# Patient Record
Sex: Male | Born: 1935 | Race: White | Hispanic: No | Smoking: Former smoker
Health system: Southern US, Community
[De-identification: ages and names within clinical notes are randomized; demographics above are authoritative.]

## PROBLEM LIST (undated history)

## (undated) DIAGNOSIS — E785 Hyperlipidemia, unspecified: Secondary | ICD-10-CM

## (undated) DIAGNOSIS — I10 Essential (primary) hypertension: Secondary | ICD-10-CM

## (undated) DIAGNOSIS — Z8719 Personal history of other diseases of the digestive system: Secondary | ICD-10-CM

## (undated) DIAGNOSIS — I471 Supraventricular tachycardia, unspecified: Secondary | ICD-10-CM

## (undated) DIAGNOSIS — K5790 Diverticulosis of intestine, part unspecified, without perforation or abscess without bleeding: Secondary | ICD-10-CM

## (undated) DIAGNOSIS — I739 Peripheral vascular disease, unspecified: Secondary | ICD-10-CM

## (undated) DIAGNOSIS — K219 Gastro-esophageal reflux disease without esophagitis: Secondary | ICD-10-CM

## (undated) DIAGNOSIS — I5042 Chronic combined systolic (congestive) and diastolic (congestive) heart failure: Secondary | ICD-10-CM

## (undated) DIAGNOSIS — I2510 Atherosclerotic heart disease of native coronary artery without angina pectoris: Secondary | ICD-10-CM

## (undated) DIAGNOSIS — I350 Nonrheumatic aortic (valve) stenosis: Secondary | ICD-10-CM

## (undated) DIAGNOSIS — E039 Hypothyroidism, unspecified: Secondary | ICD-10-CM

## (undated) DIAGNOSIS — I255 Ischemic cardiomyopathy: Secondary | ICD-10-CM

## (undated) DIAGNOSIS — I4719 Other supraventricular tachycardia: Secondary | ICD-10-CM

## (undated) DIAGNOSIS — Z7409 Other reduced mobility: Secondary | ICD-10-CM

## (undated) DIAGNOSIS — H3530 Unspecified macular degeneration: Secondary | ICD-10-CM

## (undated) DIAGNOSIS — R296 Repeated falls: Secondary | ICD-10-CM

## (undated) DIAGNOSIS — I4892 Unspecified atrial flutter: Secondary | ICD-10-CM

## (undated) DIAGNOSIS — I480 Paroxysmal atrial fibrillation: Secondary | ICD-10-CM

## (undated) DIAGNOSIS — Z952 Presence of prosthetic heart valve: Secondary | ICD-10-CM

## (undated) DIAGNOSIS — Z9989 Dependence on other enabling machines and devices: Secondary | ICD-10-CM

## (undated) DIAGNOSIS — G4733 Obstructive sleep apnea (adult) (pediatric): Secondary | ICD-10-CM

## (undated) HISTORY — DX: Supraventricular tachycardia, unspecified: I47.10

## (undated) HISTORY — DX: Diverticulosis of intestine, part unspecified, without perforation or abscess without bleeding: K57.90

## (undated) HISTORY — PX: CATARACT EXTRACTION W/ INTRAOCULAR LENS  IMPLANT, BILATERAL: SHX1307

## (undated) HISTORY — DX: Hyperlipidemia, unspecified: E78.5

## (undated) HISTORY — DX: Obstructive sleep apnea (adult) (pediatric): G47.33

## (undated) HISTORY — DX: Ischemic cardiomyopathy: I25.5

## (undated) HISTORY — DX: Personal history of other diseases of the digestive system: Z87.19

## (undated) HISTORY — DX: Unspecified macular degeneration: H35.30

## (undated) HISTORY — DX: Peripheral vascular disease, unspecified: I73.9

## (undated) HISTORY — DX: Supraventricular tachycardia: I47.1

## (undated) HISTORY — DX: Essential (primary) hypertension: I10

## (undated) HISTORY — PX: CORNEAL TRANSPLANT: SHX108

## (undated) HISTORY — DX: Dependence on other enabling machines and devices: Z99.89

## (undated) HISTORY — DX: Unspecified atrial flutter: I48.92

## (undated) HISTORY — DX: Atherosclerotic heart disease of native coronary artery without angina pectoris: I25.10

## (undated) HISTORY — PX: GLAUCOMA SURGERY: SHX656

## (undated) HISTORY — DX: Nonrheumatic aortic (valve) stenosis: I35.0

---
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
## 1998-09-02 ENCOUNTER — Encounter: Payer: Self-pay | Admitting: Specialist

## 1998-09-07 ENCOUNTER — Ambulatory Visit (HOSPITAL_COMMUNITY): Admission: RE | Admit: 1998-09-07 | Discharge: 1998-09-07 | Payer: Self-pay | Admitting: Specialist

## 1999-07-12 ENCOUNTER — Ambulatory Visit: Admission: RE | Admit: 1999-07-12 | Discharge: 1999-07-12 | Payer: Self-pay | Admitting: Internal Medicine

## 2000-03-05 ENCOUNTER — Ambulatory Visit (HOSPITAL_COMMUNITY): Admission: RE | Admit: 2000-03-05 | Discharge: 2000-03-05 | Payer: Self-pay | Admitting: Specialist

## 2000-03-05 ENCOUNTER — Encounter: Payer: Self-pay | Admitting: Specialist

## 2000-10-15 ENCOUNTER — Other Ambulatory Visit: Admission: RE | Admit: 2000-10-15 | Discharge: 2000-10-15 | Payer: Self-pay | Admitting: *Deleted

## 2000-10-15 ENCOUNTER — Encounter (INDEPENDENT_AMBULATORY_CARE_PROVIDER_SITE_OTHER): Payer: Self-pay | Admitting: Specialist

## 2002-06-18 ENCOUNTER — Emergency Department (HOSPITAL_COMMUNITY): Admission: EM | Admit: 2002-06-18 | Discharge: 2002-06-18 | Payer: Self-pay | Admitting: *Deleted

## 2002-12-15 ENCOUNTER — Encounter: Payer: Self-pay | Admitting: Family Medicine

## 2002-12-15 ENCOUNTER — Ambulatory Visit (HOSPITAL_COMMUNITY): Admission: RE | Admit: 2002-12-15 | Discharge: 2002-12-15 | Payer: Self-pay | Admitting: Family Medicine

## 2002-12-20 ENCOUNTER — Ambulatory Visit (HOSPITAL_COMMUNITY): Admission: RE | Admit: 2002-12-20 | Discharge: 2002-12-20 | Payer: Self-pay | Admitting: Family Medicine

## 2002-12-20 ENCOUNTER — Encounter: Payer: Self-pay | Admitting: Family Medicine

## 2003-01-08 ENCOUNTER — Encounter: Payer: Self-pay | Admitting: Family Medicine

## 2003-01-08 ENCOUNTER — Encounter: Admission: RE | Admit: 2003-01-08 | Discharge: 2003-01-08 | Payer: Self-pay | Admitting: Family Medicine

## 2004-12-13 ENCOUNTER — Ambulatory Visit: Payer: Self-pay | Admitting: Gastroenterology

## 2004-12-25 ENCOUNTER — Ambulatory Visit: Payer: Self-pay | Admitting: Gastroenterology

## 2006-05-27 ENCOUNTER — Encounter: Admission: RE | Admit: 2006-05-27 | Discharge: 2006-05-27 | Payer: Self-pay | Admitting: Family Medicine

## 2008-06-29 ENCOUNTER — Encounter: Admission: RE | Admit: 2008-06-29 | Discharge: 2008-06-29 | Payer: Self-pay | Admitting: Family Medicine

## 2008-12-25 ENCOUNTER — Ambulatory Visit: Payer: Self-pay | Admitting: Cardiology

## 2008-12-25 ENCOUNTER — Ambulatory Visit: Payer: Self-pay | Admitting: Internal Medicine

## 2008-12-25 ENCOUNTER — Observation Stay (HOSPITAL_COMMUNITY): Admission: EM | Admit: 2008-12-25 | Discharge: 2008-12-27 | Payer: Self-pay | Admitting: Emergency Medicine

## 2008-12-26 ENCOUNTER — Encounter: Payer: Self-pay | Admitting: *Deleted

## 2008-12-26 LAB — CONVERTED CEMR LAB
Cholesterol: 112 mg/dL
Triglycerides: 174 mg/dL

## 2008-12-27 ENCOUNTER — Encounter: Payer: Self-pay | Admitting: Internal Medicine

## 2008-12-27 ENCOUNTER — Ambulatory Visit: Payer: Self-pay | Admitting: *Deleted

## 2008-12-27 ENCOUNTER — Encounter (INDEPENDENT_AMBULATORY_CARE_PROVIDER_SITE_OTHER): Payer: Self-pay | Admitting: Emergency Medicine

## 2009-01-14 ENCOUNTER — Ambulatory Visit: Payer: Self-pay | Admitting: Internal Medicine

## 2009-01-14 ENCOUNTER — Encounter: Payer: Self-pay | Admitting: *Deleted

## 2009-01-14 DIAGNOSIS — E785 Hyperlipidemia, unspecified: Secondary | ICD-10-CM

## 2009-01-14 DIAGNOSIS — J301 Allergic rhinitis due to pollen: Secondary | ICD-10-CM

## 2009-01-27 ENCOUNTER — Telehealth: Payer: Self-pay | Admitting: Internal Medicine

## 2009-01-31 ENCOUNTER — Telehealth: Payer: Self-pay | Admitting: Internal Medicine

## 2009-05-12 ENCOUNTER — Telehealth: Payer: Self-pay | Admitting: Internal Medicine

## 2009-05-27 ENCOUNTER — Telehealth: Payer: Self-pay | Admitting: Internal Medicine

## 2009-05-31 ENCOUNTER — Telehealth: Payer: Self-pay | Admitting: Internal Medicine

## 2009-07-22 ENCOUNTER — Inpatient Hospital Stay (HOSPITAL_COMMUNITY): Admission: EM | Admit: 2009-07-22 | Discharge: 2009-07-25 | Payer: Self-pay | Admitting: Emergency Medicine

## 2009-07-22 ENCOUNTER — Encounter (INDEPENDENT_AMBULATORY_CARE_PROVIDER_SITE_OTHER): Payer: Self-pay | Admitting: Internal Medicine

## 2009-07-22 ENCOUNTER — Ambulatory Visit: Payer: Self-pay | Admitting: Infectious Diseases

## 2009-07-25 ENCOUNTER — Encounter (INDEPENDENT_AMBULATORY_CARE_PROVIDER_SITE_OTHER): Payer: Self-pay | Admitting: Internal Medicine

## 2009-07-25 ENCOUNTER — Encounter: Payer: Self-pay | Admitting: Infectious Diseases

## 2009-07-28 ENCOUNTER — Telehealth: Payer: Self-pay | Admitting: Internal Medicine

## 2009-08-31 ENCOUNTER — Encounter: Payer: Self-pay | Admitting: Internal Medicine

## 2009-08-31 ENCOUNTER — Ambulatory Visit (HOSPITAL_COMMUNITY): Admission: RE | Admit: 2009-08-31 | Discharge: 2009-08-31 | Payer: Self-pay | Admitting: Internal Medicine

## 2009-08-31 ENCOUNTER — Ambulatory Visit: Payer: Self-pay | Admitting: Internal Medicine

## 2009-08-31 DIAGNOSIS — R42 Dizziness and giddiness: Secondary | ICD-10-CM

## 2009-09-12 ENCOUNTER — Encounter: Payer: Self-pay | Admitting: Internal Medicine

## 2009-09-12 ENCOUNTER — Encounter: Payer: Self-pay | Admitting: Emergency Medicine

## 2009-09-12 ENCOUNTER — Ambulatory Visit: Payer: Self-pay | Admitting: Internal Medicine

## 2009-09-12 ENCOUNTER — Inpatient Hospital Stay (HOSPITAL_COMMUNITY): Admission: EM | Admit: 2009-09-12 | Discharge: 2009-09-15 | Payer: Self-pay | Admitting: Internal Medicine

## 2009-09-15 ENCOUNTER — Encounter: Payer: Self-pay | Admitting: Internal Medicine

## 2009-10-10 ENCOUNTER — Emergency Department (HOSPITAL_COMMUNITY): Admission: EM | Admit: 2009-10-10 | Discharge: 2009-10-10 | Payer: Self-pay | Admitting: Emergency Medicine

## 2009-10-11 ENCOUNTER — Telehealth: Payer: Self-pay | Admitting: Internal Medicine

## 2009-11-01 ENCOUNTER — Encounter: Payer: Self-pay | Admitting: Internal Medicine

## 2009-11-01 ENCOUNTER — Ambulatory Visit: Payer: Self-pay | Admitting: Internal Medicine

## 2009-11-21 ENCOUNTER — Telehealth: Payer: Self-pay | Admitting: Internal Medicine

## 2010-01-30 ENCOUNTER — Telehealth (INDEPENDENT_AMBULATORY_CARE_PROVIDER_SITE_OTHER): Payer: Self-pay | Admitting: *Deleted

## 2010-02-15 ENCOUNTER — Telehealth: Payer: Self-pay | Admitting: Internal Medicine

## 2010-03-24 ENCOUNTER — Ambulatory Visit: Payer: Self-pay | Admitting: Internal Medicine

## 2010-03-24 DIAGNOSIS — H3530 Unspecified macular degeneration: Secondary | ICD-10-CM | POA: Insufficient documentation

## 2010-03-24 LAB — CONVERTED CEMR LAB
Alkaline Phosphatase: 72 units/L (ref 39–117)
Creatinine, Ser: 0.73 mg/dL (ref 0.40–1.50)
Potassium: 4 meq/L (ref 3.5–5.3)
Sodium: 141 meq/L (ref 135–145)
Total Protein: 7 g/dL (ref 6.0–8.3)

## 2010-04-12 ENCOUNTER — Telehealth: Payer: Self-pay | Admitting: Internal Medicine

## 2010-05-20 ENCOUNTER — Encounter: Payer: Self-pay | Admitting: Internal Medicine

## 2010-06-20 ENCOUNTER — Telehealth: Payer: Self-pay | Admitting: Internal Medicine

## 2010-07-05 ENCOUNTER — Encounter: Payer: Self-pay | Admitting: Internal Medicine

## 2010-07-14 ENCOUNTER — Ambulatory Visit: Payer: Self-pay | Admitting: Internal Medicine

## 2010-07-14 DIAGNOSIS — H409 Unspecified glaucoma: Secondary | ICD-10-CM | POA: Insufficient documentation

## 2010-07-18 ENCOUNTER — Other Ambulatory Visit: Payer: Self-pay | Admitting: Emergency Medicine

## 2010-07-18 ENCOUNTER — Inpatient Hospital Stay (HOSPITAL_COMMUNITY): Admission: EM | Admit: 2010-07-18 | Discharge: 2010-07-19 | Payer: Self-pay | Admitting: Internal Medicine

## 2010-07-18 ENCOUNTER — Ambulatory Visit: Payer: Self-pay | Admitting: Internal Medicine

## 2010-07-18 ENCOUNTER — Encounter: Payer: Self-pay | Admitting: Internal Medicine

## 2010-07-19 ENCOUNTER — Encounter: Payer: Self-pay | Admitting: Internal Medicine

## 2010-07-19 DIAGNOSIS — K5730 Diverticulosis of large intestine without perforation or abscess without bleeding: Secondary | ICD-10-CM | POA: Insufficient documentation

## 2010-07-21 ENCOUNTER — Encounter: Payer: Self-pay | Admitting: *Deleted

## 2010-08-07 ENCOUNTER — Ambulatory Visit: Payer: Self-pay | Admitting: Internal Medicine

## 2010-08-07 LAB — CONVERTED CEMR LAB
Eosinophils Absolute: 0.4 10*3/uL (ref 0.0–0.7)
Lymphocytes Relative: 23 % (ref 12–46)
MCV: 94.4 fL (ref >=78.0)
Monocytes Absolute: 0.8 10*3/uL (ref 0.1–1.0)
Neutro Abs: 4.8 10*3/uL (ref 1.7–7.7)
Platelets: 240 10*3/uL (ref 150–400)
RBC: 4.81 M/uL (ref 4.22–5.81)
RDW: 15 % (ref 11.5–15.5)

## 2010-08-21 ENCOUNTER — Telehealth: Payer: Self-pay | Admitting: Internal Medicine

## 2010-08-22 ENCOUNTER — Encounter: Payer: Self-pay | Admitting: Internal Medicine

## 2010-09-13 ENCOUNTER — Ambulatory Visit: Payer: Self-pay | Admitting: Internal Medicine

## 2010-09-13 LAB — CONVERTED CEMR LAB
ALT: 13 units/L (ref 0–53)
Albumin: 4.4 g/dL (ref 3.5–5.2)
Bilirubin, Direct: 0.1 mg/dL (ref 0.0–0.3)
Cholesterol: 196 mg/dL (ref 0–200)
Glucose, Bld: 96 mg/dL (ref 70–99)
HDL: 45 mg/dL (ref >39)
LDL Cholesterol: 115 mg/dL — ABNORMAL HIGH (ref 0–99)
Potassium: 4.1 meq/L (ref 3.5–5.3)
Sodium: 141 meq/L (ref 135–145)
Total Bilirubin: 0.8 mg/dL (ref 0.3–1.2)
Total CHOL/HDL Ratio: 4.4
Total Protein: 6.9 g/dL (ref 6.0–8.3)

## 2010-10-13 ENCOUNTER — Encounter: Payer: Self-pay | Admitting: Internal Medicine

## 2010-10-13 ENCOUNTER — Ambulatory Visit: Admission: RE | Admit: 2010-10-13 | Discharge: 2010-10-13 | Payer: Self-pay | Source: Home / Self Care

## 2010-10-16 ENCOUNTER — Telehealth: Payer: Self-pay | Admitting: Internal Medicine

## 2010-11-09 NOTE — Assessment & Plan Note (Signed)
Summary: FU VISIT/DS   Vital Signs:  Patient profile:   77 year old male Height:      67 inches (170.18 cm) Weight:      217.7 pounds (95.91 kg) BMI:     34.22 Temp:     97.1 degrees F (36.17 degrees C) oral Pulse rate:   71 / minute BP sitting:   177 / 86  (left arm) Cuff size:   regular  Vitals Entered By: Theotis Barrio NT II (March 24, 2010 9:58 AM) CC: ROUTINE OFFICE VISIT / RASH BILATERAL THIGH   Is Patient Diabetic? No Pain Assessment Patient in pain? no      Nutritional Status BMI of > 30 = obese  Have you ever been in a relationship where you felt threatened, hurt or afraid?No   Does patient need assistance? Functional Status Self care Ambulation Normal Comments ROUTINE OFFICE VISIT  /  Rolm Baptise RASH   Primary Care Provider:  Clerance Lav MD  CC:  ROUTINE OFFICE VISIT / RASH BILATERAL THIGH  .  History of Present Illness: Patient here for general followup. Has been keeping record of blood pressures at home. He is very pleased with the care he has been receiving from Drs. Sherryll Burger and [place] and especially mentioned the changes in his medications to fewer and lower cost ones that seem to be controlling his medical issues. He denies chest pain, shortness of breath, changes in bowel habits, blood in stool, melena or hematochezia, hematuria or dysuria.  He does report a rash in the groin area on both legs which he thinks may be related to a medication. Reports that it is severely pruritic and that he does have some areas of bleeding at times after her scratches too much. Has been controlling pruritis by applying peroxide to the area.    Preventive Screening-Counseling & Management  Alcohol-Tobacco     Smoking Status: quit     Smoking Cessation Counseling: yes     Year Quit: OVER SO YEARS AGO  Caffeine-Diet-Exercise     Does Patient Exercise: yes     Type of exercise: DANCE     Exercise (avg: min/session): [facility]  Problems Prior to Update: 1)   Atrial Tachycardia-nonsustained  (ICD-785.0) 2)  Hx of Dizziness  (ICD-780.4) 3)  Allergic Rhinitis Due To Pollen  (ICD-477.0) 4)  Coronary Artery Disease Sp Mi  (ICD-414.00) 5)  Hyperlipidemia  (ICD-272.4) 6)  Essential Hypertension, Benign  (ICD-401.1)  Current Medications (verified): 1)  Amlodipine Besylate 10 Mg Tabs (Amlodipine Besylate) .... Take 1 Tablet By Mouth Once A Day 2)  Losartan Potassium 50 Mg Tabs (Losartan Potassium) .... Take 1 Tablet By Mouth Once A Day 3)  Simvastatin 20 Mg Tabs (Simvastatin) .... Take 1 Tablet By Mouth Once A Day 4)  Cpap .... Everynight For Sleep Apnea 5)  Coreg 3.125 Mg Tabs (Carvedilol) .... Take One Tablet Two Times A Day 6)  Travatan Z 0.004 % Soln (Travoprost) .... One Drop in Right Eye Once At Bedtime 7)  Lotemax 0.5 % Susp (Loteprednol Etabonate) .... One Drop in Right Eye Once Daily  Allergies (verified): 1)  ! Pcn  Past History:  Past Medical History: Last updated: 12/27/2008 CAD s/p stents (9 per pt) 1991 Obstructive Sleep Apnea Hyperlipidemia Hypertriglyceridemia Hypertension  Social History: Last updated: 03/24/2010 Lives by himself in [place] Is a veteran His grandson from [place], [place] and his grandson's wife live with him   Social History: Reviewed history from 12/27/2008 and no  changes required. Lives by himself in [place] Is a veteran His grandson from [place], [place] and his grandson's wife live with him Does Patient Exercise:  yes  Review of Systems      See HPI  Physical Exam  General:  alert, well-developed, well-nourished, and well-hydrated.   Head:  normocephalic and atraumatic.   Eyes:  vision grossly intact.  Reports decreased visual acuity in R eye from macular degeneration and is taking eye drops (noted in meds now) and is followed by an ophthalmologist Ears:  no external deformities.   Nose:  no external deformity, no external erythema, and no nasal discharge.   Mouth:  pharynx pink and moist.   Neck:  supple,  full ROM, and no masses.   Lungs:  normal respiratory effort, normal breath sounds, no crackles, and no wheezes.   Heart:  normal rate and regular rhythm.  Systolic murmur throughout much if not all of systole, frequent ectopic beats. Abdomen:  soft, non-tender, normal bowel sounds, no distention, no guarding, and no rigidity.   Neurologic:  alert & oriented X3 and cranial nerves II-XII intact.   Skin:  Rash, maculopapular in groin area with excoriations. Appearance c/w tinea cruris Psych:  Oriented X3, memory intact for recent and remote, normally interactive, good eye contact, not anxious appearing, and not depressed appearing.  He also relates several stories of different foreign nationals that he has "sponsored" to come to the [place] back in the 61s including a family from [place] and a young man from [place] who are now very well accomplished.   Impression & Recommendations:  Problem # 1:  ESSENTIAL HYPERTENSION, BENIGN (ICD-401.1) Patient brings blood pressure diary with past 8 days ranging from 113-144/63-84. Repeated BP after several minutes of sitting with 169/85 as the result. The patient had not been taking the carvedilol, but is very happy with changes made in the past by Dr. Sherryll Burger with a reduction in his medications. I believe that adding the low dose carvedilol to the regimen is appropriate today given his heart history and two checks that were elevated. Will reassess at follow up appointment.  The following medications were removed from the medication list:    Lasix 20 Mg Tabs (Furosemide) .Marland Kitchen... 1 tab daily His updated medication list for this problem includes:    Amlodipine Besylate 10 Mg Tabs (Amlodipine besylate) .Marland Kitchen... Take 1 tablet by mouth once a day    Losartan Potassium 50 Mg Tabs (Losartan potassium) .Marland Kitchen... Take 1 tablet by mouth once a day    Coreg 3.125 Mg Tabs (Carvedilol) .Marland Kitchen... Take one tablet two times a day  Problem # 2:  Sx of SKIN RASH (ICD-782.1) Appears to  be tinea cruris, patient was using peroxide at home to help with itching, but rash still present. Will treat with topical antifungal, patient told to make another appointment if thre is no improvement with topical antifungal.  His updated medication list for this problem includes:    Clotrimazole 1 % Crea (Clotrimazole) .Marland Kitchen... Apply to affected area two times a day  Problem # 3:  CORONARY ARTERY DISEASE SP MI (ICD-414.00) Patient on aspirin and will add back beta blocker as patient was not taking.   The following medications were removed from the medication list:    Lasix 20 Mg Tabs (Furosemide) .Marland Kitchen... 1 tab daily His updated medication list for this problem includes:    Amlodipine Besylate 10 Mg Tabs (Amlodipine besylate) .Marland Kitchen... Take 1 tablet by mouth once a day  Losartan Potassium 50 Mg Tabs (Losartan potassium) .Marland Kitchen... Take 1 tablet by mouth once a day    Coreg 3.125 Mg Tabs (Carvedilol) .Marland Kitchen... Take one tablet two times a day  Problem # 4:  HYPERLIPIDEMIA (ICD-272.4) Last LDL well within goal. No changes.  His updated medication list for this problem includes:    Simvastatin 20 Mg Tabs (Simvastatin) .Marland Kitchen... Take 1 tablet by mouth once a day  Problem # 5:  MACULAR DEGENERATION, RIGHT EYE (ICD-362.50) Advised patient to followup as scheduled with his ophthalmologist and to ask if certain vitamin formulations would be beneficial. Added medications to patient's list to be complete.  Complete Medication List: 1)  Amlodipine Besylate 10 Mg Tabs (Amlodipine besylate) .... Take 1 tablet by mouth once a day 2)  Losartan Potassium 50 Mg Tabs (Losartan potassium) .... Take 1 tablet by mouth once a day 3)  Simvastatin 20 Mg Tabs (Simvastatin) .... Take 1 tablet by mouth once a day 4)  Cpap  .... Everynight for sleep apnea 5)  Coreg 3.125 Mg Tabs (Carvedilol) .... Take one tablet two times a day 6)  Travatan Z 0.004 % Soln (Travoprost) .... One drop in right eye once at bedtime 7)  Lotemax 0.5 % Susp  (Loteprednol etabonate) .... One drop in right eye once daily 8)  Clotrimazole 1 % Crea (Clotrimazole) .... Apply to affected area two times a day  Other Orders: T-Comprehensive Metabolic Panel ([id number])  Patient Instructions: 1)  Please schedule a follow-up appointment in 4 months. 2)  It is important that you exercise regularly at least 20 minutes 5 times a week. If you develop chest pain, have severe difficulty breathing, or feel very tired , stop exercising immediately and seek medical attention. 3)  You need to continue to lose weight. Consider a lower calorie diet and regular exercise.  4)  Check your Blood Pressure regularly. If it is above: 150/100 you should make an appointment. Prescriptions: COREG 3.125 MG TABS (CARVEDILOL) take one tablet two times a day  #30 x 3   Entered and Authorized by:   Nilda Riggs MD   Signed by:   Nilda Riggs MD on 03/24/2010   Method used:   Electronically to        [id number] (retail)       [address]       [place], [place]  [address]       Ph: [phone] or [phone]       Fax: [phone]   RxID:   [id number] CLOTRIMAZOLE 1 % CREA (CLOTRIMAZOLE) apply to affected area two times a day  #45 gram x 1   Entered and Authorized by:   Nilda Riggs MD   Signed by:   Nilda Riggs MD on 03/24/2010   Method used:   Electronically to        [id number] (retail)       [address]       [place], [place]  [address]       Ph: [phone] or [phone]       Fax: [phone]   RxID:   [id number]  Process Orders Check Orders Results:     [facility]: Check successful Tests Sent for requisitioning (March 24, 2010 1:41 PM):     03/24/2010: [facility] -- T-Comprehensive Metabolic Panel [id number] (signed)    Prevention & Chronic Care Immunizations   Influenza vaccine: Historical  (06/08/2009)    Tetanus booster: Not documented  Pneumococcal vaccine: Not documented    H.  zoster vaccine: Not documented  Colorectal Screening   Hemoccult: Not documented    Colonoscopy: Not documented   Colonoscopy action/deferral: Refused  (08/31/2009)  Other Screening   PSA: Not documented   PSA action/deferral: Not indicated  (08/31/2009)   Smoking status: quit  (03/24/2010)  Lipids   Total Cholesterol: 112  (12/26/2008)   LDL: 52  (12/26/2008)   LDL Direct: Not documented   HDL: 25  (12/26/2008)   Triglycerides: 174  (12/26/2008)    SGOT (AST): Not documented   BMP action: Ordered   SGPT (ALT): Not documented CMP ordered    Alkaline phosphatase: Not documented   Total bilirubin: Not documented    Lipid flowsheet reviewed?: Yes   Progress toward LDL goal: Unchanged  Hypertension   Last Blood Pressure: 177 / 86  (03/24/2010)   Serum creatinine: Not documented   BMP action: Ordered   Serum potassium Not documented CMP ordered     Hypertension flowsheet reviewed?: Yes   Progress toward BP goal: Unchanged  Self-Management Support :   Personal Goals (by the next clinic visit) :      Personal blood pressure goal: 130/80  (08/31/2009)     Personal LDL goal: 100  (08/31/2009)    Patient will work on the following items until the next clinic visit to reach self-care goals:     Medications and monitoring: take my medicines every day, bring all of my medications to every visit  (03/24/2010)     Eating: drink diet soda or water instead of juice or soda, eat more vegetables, use fresh or frozen vegetables, eat foods that are low in salt, eat baked foods instead of fried foods, eat fruit for snacks and desserts, limit or avoid alcohol  (03/24/2010)     Activity: take a 30 minute walk every day  (03/24/2010)    Hypertension self-management support: Resources for patients handout, Written self-care plan  (03/24/2010)   Hypertension self-care plan printed.    Lipid self-management support: Resources for patients handout, Written self-care plan  (03/24/2010)    Lipid self-care plan printed.      Resource handout printed.

## 2010-11-09 NOTE — Assessment & Plan Note (Signed)
Summary: FU/SB.   Vital Signs:  Patient profile:   77 year old male Height:      67 inches (170.18 cm) Weight:      216.9 pounds (98.59 kg) BMI:     34.09 Temp:     97.2 degrees F oral Pulse rate:   69 / minute BP sitting:   155 / 89  (right arm) Cuff size:   large  Vitals Entered By: Chinita Pester RN (September 13, 2010 8:47 AM) CC: F/u visit. Is Patient Diabetic? No Pain Assessment Patient in pain? no      Nutritional Status BMI of > 30 = obese  Have you ever been in a relationship where you felt threatened, hurt or afraid?No   Does patient need assistance? Functional Status Self care Ambulation Impaired:Risk for fall Comments uses a cane   Primary Care Provider:  Clerance Lav MD  CC:  F/u visit.Marland Kitchen  History of Present Illness: The patient is an 77 year oldmale with past medical history significant for hypertension, hyperlipidemia, coronary artery disease, sleep apnea, and a remote history of lower GI bleed secondary to diverticolosis, who presents to the clinic today for general follow up. Of note, the patient was admitted to teaching service 07/21/10 for GI bleed at home. He was admitted for overningt obstervation. He had no further episodes of GI bleed in the hospital and Hb remianed stable. He had follow up with Dr. Madilyn Fireman 11/15 for possible colonoscopy, but at that appt, it was deemed that colonscopy was optional, and both pt and Dr. Madilyn Fireman were leaning against it. Today he states he takes two stool softners daily, and that keeps him from being constipated.  He states he has not had any more episodes of GI bleed since hospital discharge. no pain in belly, nausea, vomitng or other complaints. no dizziness or light headedness.   Depression History:      The patient denies a depressed mood most of the day and a diminished interest in his usual daily activities.         Preventive Screening-Counseling & Management  Alcohol-Tobacco     Alcohol drinks/day: 0     Smoking  Status: quit     Smoking Cessation Counseling: yes     Year Quit: OVER SO YEARS AGO  Caffeine-Diet-Exercise     Does Patient Exercise: yes     Type of exercise: DANCE     Exercise (avg: min/session): [facility]  Current Medications (verified): 1)  Amlodipine Besylate 10 Mg Tabs (Amlodipine Besylate) .... Take 1 Tablet By Mouth Once A Day 2)  Losartan Potassium 50 Mg Tabs (Losartan Potassium) .... Take 1 Tablet By Mouth Once A Day 3)  Simvastatin 20 Mg Tabs (Simvastatin) .... Take 1 Tablet By Mouth Once A Day 4)  Cpap .... Everynight For Sleep Apnea 5)  Coreg 3.125 Mg Tabs (Carvedilol) .... Take One Tablet Two Times A Day 6)  Travatan Z 0.004 % Soln (Travoprost) .... One Drop in Right Eye Once At Bedtime 7)  Lotemax 0.5 % Susp (Loteprednol Etabonate) .... One Drop in Right Eye Once Daily 8)  Clotrimazole 1 % Crea (Clotrimazole) .... Apply To Affected Area Two Times A Day 9)  Bayer Aspirin Ec Low Dose 81 Mg Tbec (Aspirin) .... Take 1 Tablet By Mouth Once A Day 10)  Colace 100 Mg Caps (Docusate Sodium) .... Take One Tab By Mouth Two Times A Day For Constipation  Allergies (verified): 1)  ! Pcn  Past History:  Past Medical  History: Last updated: 12/27/2008 CAD s/p stents (9 per pt) 1991 Obstructive Sleep Apnea Hyperlipidemia Hypertriglyceridemia Hypertension  Family History: Last updated: 12/27/2008 non contributory  Social History: Last updated: 03/24/2010 Lives by himself in [place] Is a veteran His grandson from [place], [place] and his grandson's wife live with him   Risk Factors: Alcohol Use: 0 (09/13/2010) Exercise: yes (09/13/2010)  Risk Factors: Smoking Status: quit (09/13/2010)  Physical Exam  General:  alert and well-developed.  VS reviewed, and BP manually rechecked 140/80 Neck:  supple.   Lungs:  normal respiratory effort and normal breath sounds.   Heart:  normal rate, regular rhythm, no murmur, no gallop, and no rub.   Abdomen:  soft, non-tender,  normal bowel sounds, no distention, no masses, and no guarding.   Msk:  normal ROM.   Extremities:  no LE edema noted  Neurologic:  no new focal neurologic deficits  Psych:  Oriented X3 and normally interactive.     Impression & Recommendations:  Problem # 1:  DIVERTICULOSIS, COLON (ICD-562.10) Assessment Improved Per Dr. Madilyn Fireman report 08/22/2010, colonoscopy is optional at this point, and will reserve for any future episodes of bleeding. Will continue stool softners for now. Patient has not had any further episodes of bleeding.  Problem # 2:  ESSENTIAL HYPERTENSION, BENIGN (ICD-401.1) Manually rechecked blood pressure, and it was 140/80. Patient checks blood pressure at home daily at the same time, and readings as follows: 10/29                      110/73 10/30                      132/81 10/31                      156/73 11/01                      107/64 11/02                      136/64 11/03                      131/66 11/04                      116/52 12/07                      144/73  All other blood pressures, throughout the rest of the month are 100-140s/50-60s.   Since blood pressure is well controlled at home, I will not make any changes to his regimen. Will check BMet today.   His updated medication list for this problem includes:    Amlodipine Besylate 10 Mg Tabs (Amlodipine besylate) .Marland Kitchen... Take 1 tablet by mouth once a day    Losartan Potassium 50 Mg Tabs (Losartan potassium) .Marland Kitchen... Take 1 tablet by mouth once a day    Coreg 3.125 Mg Tabs (Carvedilol) .Marland Kitchen... Take one tablet two times a day  Orders: T-Basic Metabolic Panel [id number])  BP today: 155/89 Prior BP: 130/80 (08/07/2010)  Labs Reviewed: K+: 4.0 (03/24/2010) Creat: : 0.73 (03/24/2010)   Chol: 112 (12/26/2008)   HDL: 25 (12/26/2008)   LDL: 52 (12/26/2008)   TG: 174 (12/26/2008)  Problem # 3:  HYPERLIPIDEMIA (ICD-272.4) Lipids are at goal, will check lipids today along with LFTs. Patient denies muscle  cramping, or any other  side effects related to simvastatin.  His updated medication list for this problem includes:    Simvastatin 20 Mg Tabs (Simvastatin) .Marland Kitchen... Take 1 tablet by mouth once a day  Orders: T-Lipid Profile [id number])  Labs Reviewed: SGOT: 15 (03/24/2010)   SGPT: 21 (03/24/2010)   HDL:25 (12/26/2008)  LDL:52 (12/26/2008)  Chol:112 (12/26/2008)  Trig:174 (12/26/2008)  Problem # 4:  CORONARY ARTERY DISEASE SP MI (ICD-414.00) No recent hx of chest pain, will continue current anti-hypertensive regimen, along with beta blocker, statin, and ASA.   His updated medication list for this problem includes:    Amlodipine Besylate 10 Mg Tabs (Amlodipine besylate) .Marland Kitchen... Take 1 tablet by mouth once a day    Losartan Potassium 50 Mg Tabs (Losartan potassium) .Marland Kitchen... Take 1 tablet by mouth once a day    Coreg 3.125 Mg Tabs (Carvedilol) .Marland Kitchen... Take one tablet two times a day    Bayer Aspirin Ec Low Dose 81 Mg Tbec (Aspirin) .Marland Kitchen... Take 1 tablet by mouth once a day  Labs Reviewed: Chol: 112 (12/26/2008)   HDL: 25 (12/26/2008)   LDL: 52 (12/26/2008)   TG: 174 (12/26/2008)  Complete Medication List: 1)  Amlodipine Besylate 10 Mg Tabs (Amlodipine besylate) .... Take 1 tablet by mouth once a day 2)  Losartan Potassium 50 Mg Tabs (Losartan potassium) .... Take 1 tablet by mouth once a day 3)  Simvastatin 20 Mg Tabs (Simvastatin) .... Take 1 tablet by mouth once a day 4)  Cpap  .... Everynight for sleep apnea 5)  Coreg 3.125 Mg Tabs (Carvedilol) .... Take one tablet two times a day 6)  Travatan Z 0.004 % Soln (Travoprost) .... One drop in right eye once at bedtime 7)  Lotemax 0.5 % Susp (Loteprednol etabonate) .... One drop in right eye once daily 8)  Clotrimazole 1 % Crea (Clotrimazole) .... Apply to affected area two times a day 9)  Bayer Aspirin Ec Low Dose 81 Mg Tbec (Aspirin) .... Take 1 tablet by mouth once a day 10)  Colace 100 Mg Caps (Docusate sodium) .... Take one tab by mouth two  times a day for constipation  Other Orders: T-Hepatic Function ([id number])  Patient Instructions: 1)  Please schedule a follow-up appointment in 3 months. 2)  Please continue to take all your medications as prescribed. 3)  Please continue to take your stool softners, and contact clinic if constipation worsens.  Prescriptions: COREG 3.125 MG TABS (CARVEDILOL) take one tablet two times a day  #60 Tablet x 4   Entered and Authorized by:   Melida Quitter MD   Signed by:   Melida Quitter MD on 09/13/2010   Method used:   Electronically to        [id number] (retail)       [address]       [place], [place]  [address]       Ph: [phone] or [phone]       Fax: [phone]   RxID:   [id number] SIMVASTATIN 20 MG TABS (SIMVASTATIN) Take 1 tablet by mouth once a day  #31 x 4   Entered and Authorized by:   Melida Quitter MD   Signed by:   Melida Quitter MD on 09/13/2010   Method used:   Electronically to        [id number] (retail)       [address]       [place], [place]  [address]       Ph: [phone]  or [phone]       Fax: [phone]   RxID:   [id number] LOSARTAN POTASSIUM 50 MG TABS (LOSARTAN POTASSIUM) Take 1 tablet by mouth once a day  #30 x 4   Entered and Authorized by:   Melida Quitter MD   Signed by:   Melida Quitter MD on 09/13/2010   Method used:   Electronically to        [id number] (retail)       [address]       [place], [place]  [address]       Ph: [phone] or [phone]       Fax: [phone]   RxID:   [id number] AMLODIPINE BESYLATE 10 MG TABS (AMLODIPINE BESYLATE) Take 1 tablet by mouth once a day  #31 x 4   Entered and Authorized by:   Melida Quitter MD   Signed by:   Melida Quitter MD on 09/13/2010   Method used:   Electronically to        [id number] (retail)       [address]       [place], [place]  [address]       Ph: [phone] or [phone]       Fax: [phone]   RxID:    [id number]    Orders Added: 1)  T-Lipid Profile [id number] 2)  T-Hepatic Function [id number] 3)  T-Basic Metabolic Panel [id number] 4)  Est. Patient Level IV [[address]]   Process Orders Check Orders Results:     [facility]: Check successful Tests Sent for requisitioning (September 13, 2010 9:13 AM):     09/13/2010: [facility] -- T-Lipid Profile [id number] (signed)     09/13/2010: [facility] -- T-Hepatic Function [id number] (signed)     09/13/2010: [facility] -- T-Basic Metabolic Panel [id number] (signed)     Prevention & Chronic Care Immunizations   Influenza vaccine: Fluvax MCR  (07/14/2010)    Tetanus booster: Not documented   Td booster deferral: Deferred  (07/14/2010)    Pneumococcal vaccine: Not documented   Pneumococcal vaccine deferral: Deferred  (09/13/2010)    H. zoster vaccine: Not documented   H. zoster vaccine deferral: Not available  (07/14/2010)  Colorectal Screening   Hemoccult: Not documented   Hemoccult action/deferral: Deferred  (09/13/2010)    Colonoscopy: Not documented   Colonoscopy action/deferral: Not indicated  (09/13/2010)  Other Screening   PSA: Not documented   PSA action/deferral: Not indicated  (08/31/2009)   Smoking status: quit  (09/13/2010)  Lipids   Total Cholesterol: 112  (12/26/2008)   Lipid panel action/deferral: Lipid Panel ordered   LDL: 52  (12/26/2008)   LDL Direct: Not documented   HDL: 25  (12/26/2008)   Triglycerides: 174  (12/26/2008)    SGOT (AST): 15  (03/24/2010)   BMP action: Ordered   SGPT (ALT): 21  (03/24/2010)   Alkaline phosphatase: 72  (03/24/2010)   Total bilirubin: 0.6  (03/24/2010)    Lipid flowsheet reviewed?: Yes   Progress toward LDL goal: At goal  Hypertension   Last Blood Pressure: 155 / 89  (09/13/2010)   Serum creatinine: 0.73  (03/24/2010)   BMP action: Ordered   Serum potassium 4.0   (03/24/2010)    Hypertension flowsheet reviewed?: Yes   Progress toward BP goal: Deteriorated  Self-Management Support :   Personal Goals (by the next clinic visit) :      Personal blood pressure goal: 130/80  (08/31/2009)  Personal LDL goal: 100  (08/31/2009)    Patient will work on the following items until the next clinic visit to reach self-care goals:     Medications and monitoring: take my medicines every day, bring all of my medications to every visit  (09/13/2010)     Eating: eat more vegetables, use fresh or frozen vegetables, eat foods that are low in salt, eat baked foods instead of fried foods  (09/13/2010)     Activity: take a 30 minute walk every day  (09/13/2010)    Hypertension self-management support: Written self-care plan  (09/13/2010)   Hypertension self-care plan printed.    Lipid self-management support: Written self-care plan  (09/13/2010)   Lipid self-care plan printed.

## 2010-11-09 NOTE — Assessment & Plan Note (Signed)
Summary: HFU/SB.   Vital Signs:  Patient profile:   77 year old male Height:      67 inches (170.18 cm) Weight:      216.0 pounds (98.18 kg) BMI:     33.95 BP sitting:   130 / 80  (right arm) Cuff size:   large  Vitals Entered By: Filomena Jungling NT II (August 07, 2010 8:57 AM) CC: HFU Is Patient Diabetic? No Pain Assessment Patient in pain? no      Nutritional Status BMI of > 30 = obese  Have you ever been in a relationship where you felt threatened, hurt or afraid?No   Does patient need assistance? Functional Status Self care Ambulation Wheelchair   Primary Care Provider:  Clerance Lav MD  CC:  HFU.  History of Present Illness: The patient is an 77 year oldmale with past medical history significant for hypertension,hyperlipidemia, coronary artery disease, sleep apnea, and a remote history of lower GI bleed was admitted to teaching service 07/21/10 for GI bleed at home. he was admitted for overningth obstervation. no GI bleed in the hospital and Hb remianed stable. has appointment with Dr. Madilyn Fireman in 11/15 for possible colonoscopy, he says he had 4 colonoscopies in the past last one in 2006. no records on centricity.  no episode of gi bleed since hospital discharge. no pain in belly, nausea, vomitng or other complaints. no dizziness or light headedness.   Preventive Screening-Counseling & Management  Alcohol-Tobacco     Smoking Status: quit     Smoking Cessation Counseling: yes     Year Quit: OVER SO YEARS AGO  Caffeine-Diet-Exercise     Does Patient Exercise: yes     Type of exercise: DANCE     Exercise (avg: min/session): [facility]  Current Medications (verified): 1)  Amlodipine Besylate 10 Mg Tabs (Amlodipine Besylate) .... Take 1 Tablet By Mouth Once A Day 2)  Losartan Potassium 50 Mg Tabs (Losartan Potassium) .... Take 1 Tablet By Mouth Once A Day 3)  Simvastatin 20 Mg Tabs (Simvastatin) .... Take 1 Tablet By Mouth Once A Day 4)  Cpap .... Everynight  For Sleep Apnea 5)  Coreg 3.125 Mg Tabs (Carvedilol) .... Take One Tablet Two Times A Day 6)  Travatan Z 0.004 % Soln (Travoprost) .... One Drop in Right Eye Once At Bedtime 7)  Lotemax 0.5 % Susp (Loteprednol Etabonate) .... One Drop in Right Eye Once Daily 8)  Clotrimazole 1 % Crea (Clotrimazole) .... Apply To Affected Area Two Times A Day 9)  Bayer Aspirin Ec Low Dose 81 Mg Tbec (Aspirin) .... Take 1 Tablet By Mouth Once A Day 10)  Colace 100 Mg Caps (Docusate Sodium) .... Take One Tab By Mouth Two Times A Day For Constipation  Allergies: 1)  ! Pcn  Review of Systems  The patient denies anorexia, fever, weight loss, weight gain, vision loss, decreased hearing, hoarseness, chest pain, syncope, dyspnea on exertion, peripheral edema, prolonged cough, headaches, hemoptysis, abdominal pain, melena, hematochezia, severe indigestion/heartburn, hematuria, incontinence, genital sores, muscle weakness, suspicious skin lesions, transient blindness, difficulty walking, depression, unusual weight change, abnormal bleeding, enlarged lymph nodes, angioedema, breast masses, and testicular masses.    Physical Exam  General:  Gen: VS reveiwed, Alert, well developed, nodistress ENT: mucous membranes pink & moist. No abnormal finds in ear and nose. CVC:S1 S2 , no murmurs, no abnormal heart sounds. Lungs: Clear to auscultation B/L. No wheezes, crackles or other abnormal sounds Abdomen: soft, non distended, no tender. Normal Bowel sounds  EXT: no pitting edema, no engorged veins, Pulsations normal  Neuro:alert, oriented *3, cranial nerved 2-12 intact, strenght normal in all  extremities, senstations normal to light touch.      Impression & Recommendations:  Problem # 1:  DIVERTICULOSIS, COLON (ICD-562.10)  no furhter episode of bleeding BP stable, no dizziness on getting up  plan - GI follow up on 11/15 - CBC today to follow on Hb- it was 14.1 at discharge - conintue aspirin, BP meds, colace/stool  softner - retrun to ED or the clinic for GI bleed  Orders: T-CBC w/Diff ([id number])  Complete Medication List: 1)  Amlodipine Besylate 10 Mg Tabs (Amlodipine besylate) .... Take 1 tablet by mouth once a day 2)  Losartan Potassium 50 Mg Tabs (Losartan potassium) .... Take 1 tablet by mouth once a day 3)  Simvastatin 20 Mg Tabs (Simvastatin) .... Take 1 tablet by mouth once a day 4)  Cpap  .... Everynight for sleep apnea 5)  Coreg 3.125 Mg Tabs (Carvedilol) .... Take one tablet two times a day 6)  Travatan Z 0.004 % Soln (Travoprost) .... One drop in right eye once at bedtime 7)  Lotemax 0.5 % Susp (Loteprednol etabonate) .... One drop in right eye once daily 8)  Clotrimazole 1 % Crea (Clotrimazole) .... Apply to affected area two times a day 9)  Bayer Aspirin Ec Low Dose 81 Mg Tbec (Aspirin) .... Take 1 tablet by mouth once a day 10)  Colace 100 Mg Caps (Docusate sodium) .... Take one tab by mouth two times a day for constipation   Orders Added: 1)  T-CBC w/Diff [[id number]] 2)  Est. Patient Level IV [95188]   Process Orders Check Orders Results:     [facility]: Check successful Tests Sent for requisitioning (August 07, 2010 9:19 AM):     08/07/2010: [facility] -- T-CBC w/Diff [[id number]] (signed)

## 2010-11-09 NOTE — Assessment & Plan Note (Signed)
Summary: ne/paf/increased heart 160/notes in emr   Primary Provider:  Clerance Lav MD   History of Present Illness: Eugene [name] is seen at the request of Dr. Sherryll Burger because of rapid heart rates apparently in EMR.  He is a 78 year old gentleman with a history of ischemic heart disease presenting with myocardial infarction in 1992 and in 1994 underwent angioplasty. Interval Myoview scanning in 1997 demonstrated an inferolateral scar. No cardiac evaluation has been done since then. He has had no positive chest pain or shortness of breath.  He was recently hospitalized because of GI bleeding. I suspect in that situation he was noted to have irregular heart rhythms.  An ultrasound at that time demonstrated normal left ventricular function.  He denies cardiac symptoms. Specifically he denies shortness of breath, chest discomfort, changes in exercise tolerance, palpitations, or syncope or presyncope.  Current Medications (verified): 1)  Amlodipine Besylate 10 Mg Tabs (Amlodipine Besylate) .... Take 1 Tablet By Mouth Once A Day 2)  Losartan Potassium 50 Mg Tabs (Losartan Potassium) .... Take 1 Tablet By Mouth Once A Day 3)  Simvastatin 20 Mg Tabs (Simvastatin) .... Take 1 Tablet By Mouth Once A Day 4)  Lasix 20 Mg Tabs (Furosemide) .Marland Kitchen.. 1 Tab Daily 5)  Cpap .... Everynight For Sleep Apnea 6)  Cvs Loratadine 10 Mg Tabs (Loratadine) .... Take 1 Tablet By Mouth Once A Day 7)  Coreg 3.125 Mg Tabs (Carvedilol) .... Take One Tablet Two Times A Day  Allergies (verified): 1)  ! Pcn  Past History:  Past Medical History: Last updated: 12/27/2008 CAD s/p stents (9 per pt) 1991 Obstructive Sleep Apnea Hyperlipidemia Hypertriglyceridemia Hypertension  Family History: Last updated: 12/27/2008 non contributory  Social History: Last updated: 12/27/2008 Lives by himself in [place] Is a veteran Expects his grandson and his wife to live with him in coming months  Review of Systems       full review  of systems was negative apart from a history of present illness and past medical history.   Vital Signs:  Patient profile:   76 year old male Height:      67 inches Weight:      211 pounds BMI:     33.17 Pulse rate:   81 / minute Pulse rhythm:   irregular BP sitting:   140 / 72  (right arm) Cuff size:   large  Vitals Entered By: Judithe Modest CMA (November 01, 2009 3:46 PM)  Physical Exam  General:  Well developed, well nourished, in no acute distress. Eyes:  PERRLA/EOM intact; conjunctiva and lids normal. Neck:  Neck supple, no JVD. No masses, thyromegaly or abnormal cervical nodes. Chest Wall:  no CVA Lungs:  clear to auscultation Heart:  regular rate and rhythm, S4 was noted. There is a 2/6 murmur at the apex. So a left carotid bruit was appreciated Abdomen:  soft nontender without hepatomegaly or midline pulsation Msk:  without kyphosis or scoliosis Pulses:  intact femoral pulses Extremities:  No clubbing or cyanosis.; 2+ bilateral peripheral edema Neurologic:  gross and Skin:  warm and dry Cervical Nodes:  none Psych:  affect normal    EKG  Procedure date:  11/01/2009  Findings:      sinus rhythm at 81 Intervals 0.19/0.11/0.40 Axis -35 Nonspecific ST-T changes in the lateral leads Frequent PACs and possible PVC  Impression & Recommendations:  Problem # 1:  ATRIAL TACHYCARDIA-NONSUSTAINED (ICD-785.0) Atrial tachycardia was identified on a tracing from the hospital dated 31 August 2009. Are 2-3 beats.  No heart rate 160 could be found in EMR as described  by the referral order.  as the patient has no symptoms at this point nothing needs to be done. In the event atrial fibrillation were in fact clarified, anticoagulation would be a properly addressed. Orders: EKG w/ Interpretation (93000)  Problem # 2:  CORONARY ARTERY DISEASE SP MI (ICD-414.00) Patient has had no symptoms of coronary disease. His ultrasound in December showed normal left ventricular function.  In the absence of symptoms, and no further workup is indicated. I would recommend the addition of aspirin if this is appropriate with his GI bleeding issues and is approved by his gastroenterologist. His updated medication list for this problem includes:    Amlodipine Besylate 10 Mg Tabs (Amlodipine besylate) .Marland Kitchen... Take 1 tablet by mouth once a day    Coreg 3.125 Mg Tabs (Carvedilol) .Marland Kitchen... Take one tablet two times a day

## 2010-11-09 NOTE — Consult Note (Signed)
[facility]: [facility]   Imported By: Louretta Parma 09/07/2010 11:23:01  _____________________________________________________________________  External Attachment:    Type:   Image     Comment:   External Document

## 2010-11-09 NOTE — Progress Notes (Signed)
Summary: med refill/gp  Phone Note Refill Request Message from:  Fax from Pharmacy on November 21, 2009 10:11 AM  Refills Requested: Medication #1:  LOSARTAN POTASSIUM 50 MG TABS Take 1 tablet by mouth once a day   Last Refilled: 10/23/2009  Method Requested: Electronic Initial call taken by: Chinita Pester RN,  November 21, 2009 10:11 AM  Follow-up for Phone Call       Follow-up by: Clerance Lav MD,  November 21, 2009 1:37 PM    Prescriptions: LOSARTAN POTASSIUM 50 MG TABS (LOSARTAN POTASSIUM) Take 1 tablet by mouth once a day  #30 x 3   Entered and Authorized by:   Clerance Lav MD   Signed by:   Clerance Lav MD on 11/21/2009   Method used:   Electronically to        [facility] ([facility]       [address]       [place], [place]  [address]       Ph: [phone] or [phone]       Fax: [phone]   RxID:   [id number]

## 2010-11-09 NOTE — Progress Notes (Signed)
Summary: refill/gg  Phone Note Refill Request  on April 12, 2010 2:00 PM  Refills Requested: Medication #1:  LOSARTAN POTASSIUM 50 MG TABS Take 1 tablet by mouth once a day   Last Refilled: 03/03/2010  Method Requested: Electronic Initial call taken by: Merrie Roof RN,  April 12, 2010 2:01 PM  Follow-up for Phone Call       Follow-up by: Clerance Lav MD,  April 12, 2010 8:54 PM    Prescriptions: LOSARTAN POTASSIUM 50 MG TABS (LOSARTAN POTASSIUM) Take 1 tablet by mouth once a day  #30 x 3   Entered and Authorized by:   Clerance Lav MD   Signed by:   Clerance Lav MD on 04/12/2010   Method used:   Electronically to        [facility] (retail)       [address]       [place], [place]  [address]       Ph: [phone] or [phone]       Fax: [phone]   RxID:   [id number]

## 2010-11-09 NOTE — Progress Notes (Signed)
Summary: refill/ hla  Phone Note Refill Request Message from:  Fax from Pharmacy on June 20, 2010 4:27 PM  Refills Requested: Medication #1:  SIMVASTATIN 20 MG TABS Take 1 tablet by mouth once a day   Dosage confirmed as above?Dosage Confirmed   Last Refilled: 05/13/2010  Medication #2:  AMLODIPINE BESYLATE 10 MG TABS Take 1 tablet by mouth once a day   Last Refilled: 05/13/2010  Method Requested: Electronic Initial call taken by: Marin Roberts RN,  June 20, 2010 4:28 PM  Follow-up for Phone Call       Follow-up by: Clerance Lav MD,  June 21, 2010 2:03 PM    Prescriptions: SIMVASTATIN 20 MG TABS (SIMVASTATIN) Take 1 tablet by mouth once a day  #31 x 3   Entered and Authorized by:   Clerance Lav MD   Signed by:   Clerance Lav MD on 06/21/2010   Method used:   Electronically to        [facility] (retail)       [address]       [place], [place]  [address]       Ph: [phone] or [phone]       Fax: [phone]   RxID:   [id number] AMLODIPINE BESYLATE 10 MG TABS (AMLODIPINE BESYLATE) Take 1 tablet by mouth once a day  #31 x 3   Entered and Authorized by:   Clerance Lav MD   Signed by:   Clerance Lav MD on 06/21/2010   Method used:   Electronically to        [facility] (retail)       [address]       [place], [place]  [address]       Ph: [phone] or [phone]       Fax: [phone]   RxID:   [id number]

## 2010-11-09 NOTE — Miscellaneous (Signed)
Summary: Hospital Admission.  INTERNAL MEDICINE ADMISSION HISTORY AND PHYSICAL Attending: Dr. Anderson Malta First Contact: Dr. Narda Bonds [phone] Second Contact: Dr. Arvilla Market [phone]  PCP:Dr. Sherryll Burger  GU:[id number] bleeding  HPI: 77 y/o male with pmh significant for HTN, HLD, CAD, sleep apnea and remote hx (one year ago) of lower GI bleed. Patient has had a colonoscopy in 2006 which demonstarted Diverticulosis, no further GI evaluation or proccedures done since then. He comes to ED with complaints of BRPR since Yesterday night (Total of 4 episodes). He denies any abdominal pain, nausea, vomiting, fever, CP, SOB or any other complaints. Patient endorses some constipation and hard stool 2-3 days prior to event.  Of note he had an admission in Dec of 2010 due to GI bleed as well (simillar presentation), but at that time bleeding stop on it's on, no procedures were made.  ALLERGIES: ! PCN   PAST MEDICAL HISTORY: Past Medical History: CAD s/p stents (9 per pt) 1991 Obstructive Sleep Apnea Hyperlipidemia Hypertriglyceridemia Hypertension  Glaucoma Macular degeneration Allergic rhinitis  MEDICATIONS: AMLODIPINE BESYLATE 10 MG TABS (AMLODIPINE BESYLATE) Take 1 tablet by mouth once a day LOSARTAN POTASSIUM 50 MG TABS (LOSARTAN POTASSIUM) Take 1 tablet by mouth once a day SIMVASTATIN 20 MG TABS (SIMVASTATIN) Take 1 tablet by mouth once a day * CPAP everynight for sleep apnea COREG 3.125 MG TABS (CARVEDILOL) take one tablet two times a day TRAVATAN Z 0.004 % SOLN (TRAVOPROST) One drop in right eye once at bedtime LOTEMAX 0.5 % SUSP (LOTEPREDNOL ETABONATE) One drop in right eye once daily CLOTRIMAZOLE 1 % CREA (CLOTRIMAZOLE) apply to affected area two times a day BAYER ASPIRIN EC LOW DOSE 81 MG TBEC (ASPIRIN) Take 1 tablet by mouth once a day   SOCIAL HISTORY: Lives by himself in [place] Is a veteran His grand-daughter live near by   Insurance: Secure H Patient denies alcohol and illicit  drugs. 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  FAMILY HISTORY Non contributory at his age. But no hx of GI malignancy.  ROS: As per HPI.  VITALS: T: 97.9  P:74   BP:180/95   R:20  O2SAT:94%  ON:RA PHYSICAL EXAM: General:  Pleasant, alert, overweight, and cooperative to examination.   Head:  normocephalic and atraumatic.   Eyes:  vision grossly intact, pupils equal, pupils round, pupils reactive to light, no injection and anicteric.   Mouth:  pharynx pink and moist, no erythema, and no exudates.   Neck:  supple, full ROM, no thyromegaly, no JVD, and no carotid bruits.   Lungs:  normal respiratory effort, no accessory muscle use, normal breath sounds, no crackles, and no wheezes.  Heart:  normal rate, regular rhythm, no murmur, no gallop, and no rub.   Abdomen:  soft, non-tender, normal bowel sounds, no distention, no guarding, no rebound tenderness, no hepatomegaly, and no splenomegaly.   Msk:  no joint swelling, no joint warmth, and no redness over joints.   Extremities:  No cyanosis or clubbing, positive 1 plus edema bilaterally up to mid calf  Neurologic:  alert & oriented X3, cranial nerves II-XII intact, strength normal in all extremities, sensation intact to light touch, and gait normal.   Psych:  Oriented X3, memory intact for recent and remote, normally interactive, good eye contact, not anxious appearing, and not  depressed appearing.   LABS: PTT(a-Partial Thromboplastn Time)        29                24-37            seconds  Protime ( Prothrombin Time)              13.6              11.6-15.2        seconds INR                                      1.02              0.00-1.49    Sodium (NA)                              143               135-145          mEq/L  Potassium (K)                             3.5               3.5-5.1          mEq/L  Chloride                                 109               96-112           mEq/L  CO2                                      26                19-32            mEq/L  Glucose                                  109        h      70-99            mg/dL  BUN                                      17                6-23             mg/dL  Creatinine                               0.72              0.4-1.5          mg/dL  GFR, Est Non African American            >60               >  60              mL/min  GFR, Est African American                >60               >60              mL/min    Oversized comment, see footnote  1  Bilirubin, Total                         0.8               0.3-1.2          mg/dL  Alkaline Phosphatase                     70                39-117           U/L  SGOT (AST)                               18                0-37             U/L  SGPT (ALT)                               15                0-53             U/L  Total  Protein                           5.8        l      6.0-8.3          g/dL  Albumin-Blood                            3.1        l      3.5-5.2          g/dL  Calcium                                  8.7               8.4-10.5         mg/dL    WBC                                      7.1               4.0-10.5         K/uL  RBC                                      4.52              4.22-5.81  MIL/uL  Hemoglobin (HGB)                         14.1              13.0-17.0        g/dL  Hematocrit (HCT)                         40.5              39.0-52.0        %  MCV                                      89.7              78.0-100.0       fL  MCH -                                    31.2              26.0-34.0        pg  MCHC                                     34.8              30.0-36.0        g/dL  RDW                                      14.4              11.5-15.5        %  Platelet  Count (PLT)                     198               150-400          K/uL  Neutrophils, %                           60                43-77            %  Lymphocytes, %                           23                12-46            %  Monocytes, %                             12                3-12             %  Eosinophils, %  4                 0-5              %  Basophils, %                             1                 0-1              %  Neutrophils, Absolute                    4.3               1.7-7.7          K/uL  Lymphocytes, Absolute                    1.6               0.7-4.0          K/uL  Monocytes, Absolute                      0.9               0.1-1.0          K/uL  Eosinophils, Absolute                    0.3               0.0-0.7          K/uL  Basophils, Absolute                      0.1               0.0-0.1          K/uL    Occult Blood, Fecal                      POSITIVE  ASSESSMENT AND PLAN: (1-)GI Bleeding: Most likely due to Diverticulosis(especially with hx of it in 2006), hemorrhoids or AVM's. Other less likely causes includes malignancy, colitis and rapid upper GI bleeding.Since patient if hemodinamically stable: -Will admit to telemetry bed -Type & screen -2 large bores iv -Will start protonix -Will check CBC every 8 hours -Will check orthostatic VS -Fluid resucitation -Clear liquid diet -Will hold ASA and also will hold his antihypertensives for now. -GI consult  (2-)HTN: Will hold antihypertensive drugs in the scenario of acute bleeding; will slowly restart them when safe and appropriate.  (3-)HLD: Will continue statins and will check FLP in am.  (4-)SLeep APnea: Will continue CPAP.  (5-)Glaucoma: Will continue his eye drops as prescribed.  (6-)Allergic rhinitis: Will continue claritin.  (7-)VTE PROPH: SCD for prophylaxis.

## 2010-11-09 NOTE — Progress Notes (Signed)
Summary: Refill/gh  Phone Note Refill Request Message from:  Fax from Pharmacy on October 11, 2009 1:57 PM  Refills Requested: Medication #1:  TRICOR 145 MG TABS Take 1 tablet by mouth once a day  Medication #2:  SIMVASTATIN 20 MG TABS Take 1 tablet by mouth once a day  Method Requested: Electronic Initial call taken by: Angelina Ok RN,  October 11, 2009 1:57 PM  Follow-up for Phone Call       Follow-up by: Clerance Lav MD,  October 12, 2009 4:11 PM    Prescriptions: [facility] LORATADINE 10 MG TABS (LORATADINE) Take 1 tablet by mouth once a day  #31 x 3   Entered and Authorized by:   Clerance Lav MD   Signed by:   Clerance Lav MD on 10/12/2009   Method used:   Electronically to        [facility] ([facility]ail)       [address]       [place], [place]  [address]       Ph: [phone] or [phone]       Fax: [phone]   RxID:   [id number] LASIX 20 MG TABS (FUROSEMIDE) 1 tab daily  #30 x 2   Entered and Authorized by:   Clerance Lav MD   Signed by:   Clerance Lav MD on 10/12/2009   Method used:   Electronically to        [facility] ([facility]ail)       [address]       [place], [place]  [address]       Ph: [phone] or [phone]       Fax: [phone]   RxID:   [id number] AMLODIPINE BESYLATE 10 MG TABS (AMLODIPINE BESYLATE) Take 1 tablet by mouth once a day  #31 x 3   Entered and Authorized by:   Clerance Lav MD   Signed by:   Clerance Lav MD on 10/12/2009   Method used:   Electronically to        [facility] ([facility]ail)       [address]       [place], [place]  [address]       Ph: [phone] or [phone]       Fax: [phone]   RxID:   [id number] TRICOR 145 MG TABS (FENOFIBRATE) Take 1 tablet by mouth once a day  #31 x 3   Entered and Authorized by:   Clerance Lav MD   Signed by:   Clerance Lav MD on 10/12/2009   Method used:   Electronically to        [facility] (retail)       [address]       [place],  [place]  [address]       Ph: [phone] or [phone]       Fax: [phone]   RxID:   [id number] SIMVASTATIN 20 MG TABS (SIMVASTATIN) Take 1 tablet by mouth once a day  #31 x 3   Entered and Authorized by:   Clerance Lav MD   Signed by:   Clerance Lav MD on 10/12/2009   Method used:   Electronically to        [facility] ([facility]ail)       [address]       [place], [place]  [address]       Ph: [phone] or [phone]  Fax: [phone]   RxID:   [id number]

## 2010-11-09 NOTE — Letter (Signed)
Summary: BLOOD SUGER READING /05-20-2010-08-04-2010  BLOOD SUGER READING /05-20-2010-08-04-2010   Imported By: Margie Billet 08/21/2010 15:44:37  _____________________________________________________________________  External Attachment:    Type:   Image     Comment:   External Document

## 2010-11-09 NOTE — Medication Information (Signed)
Summary: DRUG INTERACTION   DRUG INTERACTION   Imported By: Margie Billet 10/17/2010 14:35:03  _____________________________________________________________________  External Attachment:    Type:   Image     Comment:   External Document

## 2010-11-09 NOTE — Discharge Summary (Signed)
Summary: Hospital Discharge Update    Hospital Discharge Update:  Date of Admission: 07/18/2010 Date of Discharge: 07/19/2010  Brief Summary:  Pt admitted for an overnight observation. Reported 3-4 episodes of maroon colored stools after previously being constipated. He reported no abdominal pain, weakness, fatigue. He was also hemodynamically throughout. He was not orthostatic. Hb on presentation was 14.1 and stayed stable at 13.2 then 14.1 by discharge. He was seen by Dr. Madilyn Fireman of GI during the admission and did not plan for urgent colonoscopy. Likely this episode was secondary to his diverticular disease. This is similar to his December 2010 admission where he presented w/ the same chief complaint and had the same hospital course that did not require transfusions, scopes or nuclear studies as the bleeding stopped by day 2 of admission and he was again hemodynamically stable throughout. During his brief hospitalization, he tolerated regular diet well, no episodes of bloody bms in about 20hours during the hospitalization, he was euvolemic and is to get his Hb checked in the clinic on Oct 14. I also arranged for him to f/u with Dr. Madilyn Fireman as an outpatient. He was discharged in stable condition with the instructions listed below.   Labs needed at follow-up: CBC with differential  Other follow-up issues:  Check Hb Followup with Dr. Madilyn [facility] of [facility]  Problem list changes:  Added new problem of DIVERTICULOSIS, COLON (ICD-562.10)  Medication list changes:  Added new medication of COLACE 100 MG CAPS (DOCUSATE SODIUM) take one tab by mouth two times a day for constipation - Signed Rx of COLACE 100 MG CAPS (DOCUSATE SODIUM) take one tab by mouth two times a day for constipation;  #60 x 3;  Signed;  Entered by: Jaci Lazier MD;  Authorized by: Jaci Lazier MD;  Method used: Electronically to [facility] [address], [place], [place]  [address], Ph: [phone] or [phone], Fax:  [phone] Rx of COLACE 100 MG CAPS (DOCUSATE SODIUM) take one tab by mouth two times a day for constipation;  #60 x 3;  Signed;  Entered by: Jaci Lazier MD;  Authorized by: Jaci Lazier MD;  Method used: Print then Give to Patient  The medication, problem, and allergy lists have been updated.  Please see the dictated discharge summary for details.  Discharge medications:  AMLODIPINE BESYLATE 10 MG TABS (AMLODIPINE BESYLATE) Take 1 tablet by mouth once a day LOSARTAN POTASSIUM 50 MG TABS (LOSARTAN POTASSIUM) Take 1 tablet by mouth once a day SIMVASTATIN 20 MG TABS (SIMVASTATIN) Take 1 tablet by mouth once a day * CPAP everynight for sleep apnea COREG 3.125 MG TABS (CARVEDILOL) take one tablet two times a day TRAVATAN Z 0.004 % SOLN (TRAVOPROST) One drop in right eye once at bedtime LOTEMAX 0.5 % SUSP (LOTEPREDNOL ETABONATE) One drop in right eye once daily CLOTRIMAZOLE 1 % CREA (CLOTRIMAZOLE) apply to affected area two times a day BAYER ASPIRIN EC LOW DOSE 81 MG TBEC (ASPIRIN) Take 1 tablet by mouth once a day COLACE 100 MG CAPS (DOCUSATE SODIUM) take one tab by mouth two times a day for constipation  Other patient instructions:  Pls do not take your Aspirin for the next 7 days. You may begin taking it on Jul 26, 2010. We would like for you to be on bowel rest for the next few days and recommend that you eat only soft foods/clear  liquids like mashed potatos, soups, soft noodles and drink lots of water. Also try to increase your dietary fiber intake. Note  that you may have maroon colored stools for the next few days, however, call the clinic or go to your nearest ER if you notice dark tarry stools, fever to 101 or greater, chest pain or trouble breathing.  Note: Hospital Discharge Medications & Other Instructions handout was printed, one copy for patient and a second copy to be placed in hospital chart.  Prescriptions: COLACE 100 MG CAPS (DOCUSATE SODIUM) take one tab by mouth two times a  day for constipation  #60 x 3   Entered and Authorized by:   Jaci Lazier MD   Signed by:   Jaci Lazier MD on 07/19/2010   Method used:   Print then Give to Patient   RxID:   [id number] COLACE 100 MG CAPS (DOCUSATE SODIUM) take one tab by mouth two times a day for constipation  #60 x 3   Entered and Authorized by:   Jaci Lazier MD   Signed by:   Jaci Lazier MD on 07/19/2010   Method used:   Electronically to        [facility] ([facility]ail)       [address]       [place], [place]  [address]       Ph: [phone] or [phone]       Fax: [phone]   RxID:   [id number]

## 2010-11-09 NOTE — Progress Notes (Signed)
Summary: Refill/gh  Phone Note Refill Request Message from:  Fax from Pharmacy on Feb 15, 2010 12:09 PM  Refills Requested: Medication #1:  SIMVASTATIN 20 MG TABS Take 1 tablet by mouth once a day   Last Refilled: 01/14/2010  Medication #2:  AMLODIPINE BESYLATE 10 MG TABS Take 1 tablet by mouth once a day   Last Refilled: 01/14/2010  Method Requested: Electronic Initial call taken by: Angelina Ok RN,  Feb 15, 2010 12:09 PM  Follow-up for Phone Call       Follow-up by: Clerance Lav MD,  Feb 15, 2010 3:07 PM    Prescriptions: AMLODIPINE BESYLATE 10 MG TABS (AMLODIPINE BESYLATE) Take 1 tablet by mouth once a day  #31 x 3   Entered and Authorized by:   Clerance Lav MD   Signed by:   Clerance Lav MD on 02/15/2010   Method used:   Electronically to        [facility] ([facility])       [address]       [place], [place]  [address]       Ph: [phone] or [phone]       Fax: [phone]   RxID:   [id number] SIMVASTATIN 20 MG TABS (SIMVASTATIN) Take 1 tablet by mouth once a day  #31 x 3   Entered and Authorized by:   Clerance Lav MD   Signed by:   Clerance Lav MD on 02/15/2010   Method used:   Electronically to        [facility] ([facility])       [address]       [place], [place]  [address]       Ph: [phone] or [phone]       Fax: [phone]   RxID:   [id number]

## 2010-11-09 NOTE — Assessment & Plan Note (Signed)
Summary: EST-CK/FU/MEDS/CFB   Vital Signs:  Patient profile:   77 year old male Height:      67 inches (170.18 cm) Weight:      216.7 pounds (98.95 kg) BMI:     34.06 Temp:     96.7 degrees F (35.94 degrees C) Pulse rate:   68 / minute BP sitting:   173 / 97  (left arm) Cuff size:   regular  Vitals Entered By: Theotis Barrio NT II (July 14, 2010 11:11 AM) CC: ? FLU SHOT - PATIENT STATES THAT HE HAS A COLD / ROUTINE OFFICE VISIT / NO CONCERNS NOR COMPLAINTS Is Patient Diabetic? No Pain Assessment Patient in pain? no      Nutritional Status BMI of > 30 = obese  Have you ever been in a relationship where you felt threatened, hurt or afraid?No   Does patient need assistance? Functional Status Self care Ambulation Normal   Primary Care Terez Montee:  Clerance Lav MD  CC:  ? FLU SHOT - PATIENT STATES THAT HE HAS A COLD / ROUTINE OFFICE VISIT / NO CONCERNS NOR COMPLAINTS.  History of Present Illness: Eugene [name] is an 77 yo man with PMH as outlined below.  He is here for routine visit.  He asked about the flu shot but states he had a cold about 4 weeks ago.  He believes it has resolved.  Only has a little bit of congestion.  Sore throat, dry mouth, productive cough of whitish/gray phlegm.  Now resolved.    All other systems reviewed and negative.    Preventive Screening-Counseling & Management  Alcohol-Tobacco     Smoking Status: quit     Smoking Cessation Counseling: yes     Year Quit: OVER SO YEARS AGO  Caffeine-Diet-Exercise     Does Patient Exercise: yes     Type of exercise: DANCE     Exercise (avg: min/session): [facility]  Current Medications (verified): 1)  Amlodipine Besylate 10 Mg Tabs (Amlodipine Besylate) .... Take 1 Tablet By Mouth Once A Day 2)  Losartan Potassium 50 Mg Tabs (Losartan Potassium) .... Take 1 Tablet By Mouth Once A Day 3)  Simvastatin 20 Mg Tabs (Simvastatin) .... Take 1 Tablet By Mouth Once A Day 4)  Cpap .... Everynight For Sleep  Apnea 5)  Coreg 3.125 Mg Tabs (Carvedilol) .... Take One Tablet Two Times A Day 6)  Travatan Z 0.004 % Soln (Travoprost) .... One Drop in Right Eye Once At Bedtime 7)  Lotemax 0.5 % Susp (Loteprednol Etabonate) .... One Drop in Right Eye Once Daily 8)  Clotrimazole 1 % Crea (Clotrimazole) .... Apply To Affected Area Two Times A Day 9)  Bayer Aspirin Ec Low Dose 81 Mg Tbec (Aspirin) .... Take 1 Tablet By Mouth Once A Day  Allergies (verified): 1)  ! Pcn  Past History:  Past Medical History: Last updated: 12/27/2008 CAD s/p stents (9 per pt) 1991 Obstructive Sleep Apnea Hyperlipidemia Hypertriglyceridemia Hypertension  Family History: Last updated: 12/27/2008 non contributory  Social History: Last updated: 03/24/2010 Lives by himself in [place] Is a veteran His grandson from [place], [place] and his grandson's wife live with him   Risk Factors: Exercise: yes (07/14/2010)  Risk Factors: Smoking Status: quit (07/14/2010)  Review of Systems      See HPI  Physical Exam  General:  alert, well-developed, well-nourished, and well-hydrated.  overweight-appearing.   Eyes:  anicteric, wearing glasses Lungs:  normal respiratory effort, no accessory muscle use, normal breath sounds,  no crackles, and no wheezes.   Heart:  normal rate and regular rhythm.  Occasional ectopy. Grade 2/6 systolic murmur.  Abdomen:  normal bowel sounds.   Neurologic:  alert & oriented X3.  Grossly nonfocal Psych:  Oriented X3, memory intact for recent and remote, and normally interactive.     Impression & Recommendations:  Problem # 1:  GLAUCOMA, RIGHT EYE (ICD-365.9) Managed by Dr. Hazle Quant On travatan and lotemax Seen by ophtho last week.  Problem # 2:  CORONARY ARTERY DISEASE SP MI (ICD-414.00) On ASA, B-blocker, statin and ARB See seperate assessments for risk factors  His updated medication list for this problem includes:    Amlodipine Besylate 10 Mg Tabs (Amlodipine besylate) .Marland Kitchen... Take 1 tablet  by mouth once a day    Losartan Potassium 50 Mg Tabs (Losartan potassium) .Marland Kitchen... Take 1 tablet by mouth once a day    Coreg 3.125 Mg Tabs (Carvedilol) .Marland Kitchen... Take one tablet two times a day    Bayer Aspirin Ec Low Dose 81 Mg Tbec (Aspirin) .Marland Kitchen... Take 1 tablet by mouth once a day  Labs Reviewed: Chol: 112 (12/26/2008)   HDL: 25 (12/26/2008)   LDL: 52 (12/26/2008)   TG: 174 (12/26/2008)  Problem # 3:  HYPERLIPIDEMIA (ICD-272.4) Lipids at goal but checked last year. However, on a statin and not fasting today. Can consider rechecking on f/u visits with consideration of dose adjustment if needed.  His updated medication list for this problem includes:    Simvastatin 20 Mg Tabs (Simvastatin) .Marland Kitchen... Take 1 tablet by mouth once a day  Labs Reviewed: SGOT: 15 (03/24/2010)   SGPT: 21 (03/24/2010)   HDL:25 (12/26/2008)  LDL:52 (12/26/2008)  Chol:112 (12/26/2008)  Trig:174 (12/26/2008)  Problem # 4:  ESSENTIAL HYPERTENSION, BENIGN (ICD-401.1) Rechecked manually and similar to values below.   However, pt has a log with well controlled numbers. Doesn't feel presyncopal but does notice a change when BP wnl. Would consider BP continuous monitor to further assess trends.Marland Kitchen...will discuss with PCP.  His updated medication list for this problem includes:    Amlodipine Besylate 10 Mg Tabs (Amlodipine besylate) .Marland Kitchen... Take 1 tablet by mouth once a day    Losartan Potassium 50 Mg Tabs (Losartan potassium) .Marland Kitchen... Take 1 tablet by mouth once a day    Coreg 3.125 Mg Tabs (Carvedilol) .Marland Kitchen... Take one tablet two times a day  BP today: 173/97 Prior BP: 177/86 (03/24/2010)  Labs Reviewed: K+: 4.0 (03/24/2010) Creat: : 0.73 (03/24/2010)   Chol: 112 (12/26/2008)   HDL: 25 (12/26/2008)   LDL: 52 (12/26/2008)   TG: 174 (12/26/2008)  Complete Medication List: 1)  Amlodipine Besylate 10 Mg Tabs (Amlodipine besylate) .... Take 1 tablet by mouth once a day 2)  Losartan Potassium 50 Mg Tabs (Losartan potassium) .... Take  1 tablet by mouth once a day 3)  Simvastatin 20 Mg Tabs (Simvastatin) .... Take 1 tablet by mouth once a day 4)  Cpap  .... Everynight for sleep apnea 5)  Coreg 3.125 Mg Tabs (Carvedilol) .... Take one tablet two times a day 6)  Travatan Z 0.004 % Soln (Travoprost) .... One drop in right eye once at bedtime 7)  Lotemax 0.5 % Susp (Loteprednol etabonate) .... One drop in right eye once daily 8)  Clotrimazole 1 % Crea (Clotrimazole) .... Apply to affected area two times a day 9)  Bayer Aspirin Ec Low Dose 81 Mg Tbec (Aspirin) .... Take 1 tablet by mouth once a day  Other Orders: Influenza Vaccine  MCR [id number])  Patient Instructions: 1)  Please schedule a follow-up appointment in 3 months. 2)  Continue checking your blood pressure as you have been doing.  If blood pressure is above 160/100 at any time, call clinic. 3)  Continue your medications below.   4)  If you have any problems before your next visit, call clinic. 5)  If you need refills, you can call clinic or your pharmacy to call us.   Prevention & Chronic Care Immunizations   Influenza vaccine: Fluvax MCR  (07/14/2010)    Tetanus booster: Not documented   Td booster deferral: Deferred  (07/14/2010)    Pneumococcal vaccine: Not documented   Pneumococcal vaccine deferral: Deferred  (07/14/2010)    H. zoster vaccine: Not documented   H. zoster vaccine deferral: Not available  (07/14/2010)  Colorectal Screening   Hemoccult: Not documented    Colonoscopy: Not documented   Colonoscopy action/deferral: Refused  (08/31/2009)  Other Screening   PSA: Not documented   PSA action/deferral: Not indicated  (08/31/2009)   Smoking status: quit  (07/14/2010)  Lipids   Total Cholesterol: 112  (12/26/2008)   Lipid panel action/deferral: Deferred   LDL: 52  (12/26/2008)   LDL Direct: Not documented   HDL: 25  (12/26/2008)   Triglycerides: 174  (12/26/2008)    SGOT (AST): 15  (03/24/2010)   BMP action: Ordered   SGPT (ALT): 21   (03/24/2010)   Alkaline phosphatase: 72  (03/24/2010)   Total bilirubin: 0.6  (03/24/2010)    Lipid flowsheet reviewed?: Yes   Progress toward LDL goal: At goal  Hypertension   Last Blood Pressure: 173 / 97  (07/14/2010)   Serum creatinine: 0.73  (03/24/2010)   BMP action: Ordered   Serum potassium 4.0  (03/24/2010)    Hypertension flowsheet reviewed?: Yes   Progress toward BP goal: Unchanged   Hypertension comments: Pt has log with normal BPs documented....see scanned report.   Consider BP monitor to assess trend.  Self-Management Support :   Personal Goals (by the next clinic visit) :      Personal blood pressure goal: 130/80  (08/31/2009)     Personal LDL goal: 100  (08/31/2009)    Patient will work on the following items until the next clinic visit to reach self-care goals:     Medications and monitoring: take my medicines every day, bring all of my medications to every visit  (07/14/2010)     Eating: drink diet soda or water instead of juice or soda, eat more vegetables, use fresh or frozen vegetables, eat foods that are low in salt, eat baked foods instead of fried foods, eat fruit for snacks and desserts, limit or avoid alcohol  (07/14/2010)     Activity: take a 30 minute walk every day  (07/14/2010)    Hypertension self-management support: Resources for patients handout, Written self-care plan  (07/14/2010)   Hypertension self-care plan printed.    Lipid self-management support: Resources for patients handout, Written self-care plan  (07/14/2010)   Lipid self-care plan printed.    Self-management comments: PATIENT GET HIS EXERCISE BY [facility]      Resource handout printed.   Nursing Instructions: Give Flu vaccine today     Influenza Vaccine    Vaccine Type: Fluvax MCR    Site: left deltoid    Mfr: [employer]    Dose: 0.5 ml    Route: IM    Given by: Chinita Pester RN    Exp. Date: 04/07/2011    Lot #:  [id number]    VIS given: 05/02/10 version given  July 14, 2010.  Flu Vaccine Consent Questions    Do you have a history of severe allergic reactions to this vaccine? no    Any prior history of allergic reactions to egg and/or gelatin? no    Do you have a sensitivity to the preservative Thimersol? no    Do you have a past history of Guillan-Barre Syndrome? no    Do you currently have an acute febrile illness? no    Have you ever had a severe reaction to latex? no    Vaccine information given and explained to patient? yes

## 2010-11-09 NOTE — Progress Notes (Signed)
Summary: Refill/gh  Phone Note Refill Request Message from:  Fax from Pharmacy on January 30, 2010 11:50 AM  Refills Requested: Medication #1:  LASIX 20 MG TABS 1 tab daily   Last Refilled: 12/26/2009 Last office visit was with Cardiologist.  pt has also been in the hospital.   Method Requested: Electronic Initial call taken by: Angelina Ok RN,  January 30, 2010 11:51 AM  Follow-up for Phone Call        Refill approved-nurse to complete  Please arrange Bmet and appointment. Follow-up by: Ulyess Mort MD,  January 30, 2010 12:20 PM  Additional Follow-up for Phone Call Additional follow up Details #1::        Pt to be scheduled for an appointment. Additional Follow-up by: Angelina Ok RN,  January 31, 2010 1:54 PM    Prescriptions: LASIX 20 MG TABS (FUROSEMIDE) 1 tab daily  #30 x 1   Entered and Authorized by:   Ulyess Mort MD   Signed by:   Ulyess Mort MD on 01/30/2010   Method used:   Electronically to        [id number] (retail)       [address]       [place], [place]  [address]       Ph: [phone] or [phone]       Fax: [phone]   RxID:   [id number]

## 2010-11-09 NOTE — Progress Notes (Signed)
  Phone Note From Pharmacy   Summary of Call: Fax indicating interaction btw Simvastain 40 and amlodipine 10. Pt has HTN and hyperlipidemia and is s/p AMI. I reviewed Dr Margaretmary Eddy note from last week - low benefit from increasing statin and low threshold to reduce it back to 20. Therefore will reduce statin back to 20 and forward message to Dr Sherryll Burger for further mgmt. The statin could be returned to 40 and the norvasc lowered and increase losartan of add HCTZ if the higher dose statin is desired.   Next [facility] appt is in April.   Follow-up for Phone Call        [facility]. Follow-up by: Chinita Pester RN,  October 16, 2010 3:37 PM    New/Updated Medications: SIMVASTATIN 20 MG TABS (SIMVASTATIN) Take one pill by mouth at bedtime for high cholesterol. Prescriptions: SIMVASTATIN 20 MG TABS (SIMVASTATIN) Take one pill by mouth at bedtime for high cholesterol.  #30 x 2   Entered and Authorized by:   Blanch Media MD   Signed by:   Blanch Media MD on 10/16/2010   Method used:   Electronically to        [id number] (retail)       [address]       [place], [place]  [address]       Ph: [phone] or [phone]       Fax: [phone]   RxID:   [id number]

## 2010-11-09 NOTE — Assessment & Plan Note (Signed)
Summary: 3 MONTH F/U VISIT/CH   Vital Signs:  Patient profile:   77 year old male Height:      67 inches (170.18 cm) Weight:      223.1 pounds (98.59 kg) BMI:     35.07 Temp:     96.4 degrees F (35.78 degrees C) oral Pulse rate:   56 / minute BP sitting:   140 / 82  (left arm) Cuff size:   large  Vitals Entered By: Theotis Barrio NT II (October 13, 2010 2:54 PM) CC: ROUTINE OFFICE VISIT/  Is Patient Diabetic? No Pain Assessment Patient in pain? no      Nutritional Status BMI of > 30 = obese  Have you ever been in a relationship where you felt threatened, hurt or afraid?No   Does patient need assistance? Functional Status Self care Ambulation Normal Comments WALKS WITH THE ASSIST OF A CANE   Primary Care Provider:  Clerance Lav MD  CC:  ROUTINE OFFICE VISIT/ .  History of Present Illness: The patient is an 77 year oldmale with past medical history significant for hypertension, hyperlipidemia, coronary artery disease, sleep apnea, and a remote history of lower GI bleed secondary to diverticolosis, who presents to the clinic today for general follow up.  He states he has not had any more episodes of GI bleed since hospital discharge. no pain in belly, nausea, vomitng or other complaints. no significant dizziness or light headedness.   Preventive Screening-Counseling & Management  Alcohol-Tobacco     Alcohol drinks/day: 0     Smoking Status: quit     Smoking Cessation Counseling: yes     Year Quit: OVER SO YEARS AGO  Caffeine-Diet-Exercise     Does Patient Exercise: yes     Type of exercise: DANCE     Exercise (avg: min/session): [facility]  Current Medications (verified): 1)  Amlodipine Besylate 10 Mg Tabs (Amlodipine Besylate) .... Take 1 Tablet By Mouth Once A Day 2)  Losartan Potassium 50 Mg Tabs (Losartan Potassium) .... Take 1 Tablet By Mouth Once A Day 3)  Simvastatin 40 Mg Tabs (Simvastatin) .... Take 1 Tablet By Mouth Once A Day 4)  Cpap ....  Everynight For Sleep Apnea 5)  Coreg 3.125 Mg Tabs (Carvedilol) .... Take One Tablet Two Times A Day 6)  Travatan Z 0.004 % Soln (Travoprost) .... One Drop in Right Eye Once At Bedtime 7)  Lotemax 0.5 % Susp (Loteprednol Etabonate) .... One Drop in Right Eye Once Daily 8)  Clotrimazole 1 % Crea (Clotrimazole) .... Apply To Affected Area Two Times A Day 9)  Bayer Aspirin Ec Low Dose 81 Mg Tbec (Aspirin) .... Take 1 Tablet By Mouth Once A Day 10)  Colace 100 Mg Caps (Docusate Sodium) .... Take One Tab By Mouth Two Times A Day For Constipation  Allergies (verified): 1)  ! Pcn  Past History:  Past Medical History: Last updated: 12/27/2008 CAD s/p stents (9 per pt) 1991 Obstructive Sleep Apnea Hyperlipidemia Hypertriglyceridemia Hypertension  Family History: Last updated: 12/27/2008 non contributory  Social History: Last updated: 03/24/2010 Lives by himself in [place] Is a veteran His grandson from [place], [place] and his grandson's wife live with him   Risk Factors: Alcohol Use: 0 (10/13/2010) Exercise: yes (10/13/2010)  Risk Factors: Smoking Status: quit (10/13/2010)  Review of Systems      See HPI  Physical Exam  General:  alert and well-developed.  VS reviewed, and BP manually rechecked 140/80 Head:  normocephalic and atraumatic.  Eyes:  anicteric, wearing glasses Ears:  no external deformities.   Nose:  no external deformity, no external erythema, and no nasal discharge.   Mouth:  pharynx pink and moist.   Neck:  supple.   Lungs:  normal respiratory effort and normal breath sounds.   Heart:  normal rate, regular rhythm, no murmur, no gallop, and no rub.   Abdomen:  soft, non-tender, normal bowel sounds, no distention, no masses, and no guarding.   Msk:  normal ROM.   Neurologic:  no new focal neurologic deficits  Skin:  Rash, maculopapular in groin area with excoriations. Appearance c/w tinea cruris Psych:  Oriented X3 and normally interactive.     Impression &  Recommendations:  Problem # 1:  ESSENTIAL HYPERTENSION, BENIGN (ICD-401.1) BP well controlled. Continue with the current regimen. No dizziness noted on the exam.  His updated medication list for this problem includes:    Amlodipine Besylate 10 Mg Tabs (Amlodipine besylate) .Marland Kitchen... Take 1 tablet by mouth once a day    Losartan Potassium 50 Mg Tabs (Losartan potassium) .Marland Kitchen... Take 1 tablet by mouth once a day    Coreg 3.125 Mg Tabs (Carvedilol) .Marland Kitchen... Take one tablet two times a day  BP today: 140/82 Prior BP: 155/89 (09/13/2010)  Labs Reviewed: K+: 4.1 (09/13/2010) Creat: : 0.72 (09/13/2010)   Chol: 196 (09/13/2010)   HDL: 45 (09/13/2010)   LDL: 115 (09/13/2010)   TG: 181 (09/13/2010)  Problem # 2:  HYPERLIPIDEMIA (ICD-272.4) Discussed LDL management with him. His LDL is little high given his prior hx of CAD. I will increase the dose of simvastatin and see if it can be lowered. In event, if he has side effects that affects hish QoL, I would back off. For 10 years CV risk calculation, lowering his total cholesterol by 20 points, does not alter his risk of 28%. So benefit is very questionable. I would have very low threshold to back off if he has any trouble tolerating the medicine.   His updated medication list for this problem includes:    Simvastatin 40 Mg Tabs (Simvastatin) .Marland Kitchen... Take 1 tablet by mouth once a day  Labs Reviewed: SGOT: 16 (09/13/2010)   SGPT: 13 (09/13/2010)   HDL:45 (09/13/2010), 25 (12/26/2008)  LDL:115 (09/13/2010), 52 (14/78/2956)  Chol:196 (09/13/2010), 112 (12/26/2008)  Trig:181 (09/13/2010), 174 (12/26/2008)  Problem # 3:  CORONARY ARTERY DISEASE SP MI (ICD-414.00) no chest pain, palpitation. Overall feeling well. Continued on aspirin.  His updated medication list for this problem includes:    Amlodipine Besylate 10 Mg Tabs (Amlodipine besylate) .Marland Kitchen... Take 1 tablet by mouth once a day    Losartan Potassium 50 Mg Tabs (Losartan potassium) .Marland Kitchen... Take 1 tablet by mouth  once a day    Coreg 3.125 Mg Tabs (Carvedilol) .Marland Kitchen... Take one tablet two times a day    Bayer Aspirin Ec Low Dose 81 Mg Tbec (Aspirin) .Marland Kitchen... Take 1 tablet by mouth once a day  Labs Reviewed: Chol: 196 (09/13/2010)   HDL: 45 (09/13/2010)   LDL: 115 (09/13/2010)   TG: 181 (09/13/2010)  Problem # 4:  MACULAR DEGENERATION, RIGHT EYE (ICD-362.50) Dr. Seymour Bars is opthalmologist, who has been monitoring the progress. He is having worsening of vision.   Problem # 5:  DIVERTICULOSIS, COLON (ICD-562.10) No new bleed noted. He is following with Dr. Madilyn Fireman, and no plan for colonoscopy given his age and cardiac risk factor unless having life threatening bleed.   Complete Medication List: 1)  Amlodipine Besylate 10 Mg  Tabs (Amlodipine besylate) .... Take 1 tablet by mouth once a day 2)  Losartan Potassium 50 Mg Tabs (Losartan potassium) .... Take 1 tablet by mouth once a day 3)  Simvastatin 40 Mg Tabs (Simvastatin) .... Take 1 tablet by mouth once a day 4)  Cpap  .... Everynight for sleep apnea 5)  Coreg 3.125 Mg Tabs (Carvedilol) .... Take one tablet two times a day 6)  Travatan Z 0.004 % Soln (Travoprost) .... One drop in right eye once at bedtime 7)  Lotemax 0.5 % Susp (Loteprednol etabonate) .... One drop in right eye once daily 8)  Clotrimazole 1 % Crea (Clotrimazole) .... Apply to affected area two times a day 9)  Bayer Aspirin Ec Low Dose 81 Mg Tbec (Aspirin) .... Take 1 tablet by mouth once a day 10)  Colace 100 Mg Caps (Docusate sodium) .... Take one tab by mouth two times a day for constipation  Patient Instructions: 1)  Please schedule a follow-up appointment in 3 months. Prescriptions: SIMVASTATIN 40 MG TABS (SIMVASTATIN) Take 1 tablet by mouth once a day  #30 x 3   Entered and Authorized by:   Clerance Lav MD   Signed by:   Clerance Lav MD on 10/13/2010   Method used:   Electronically to        [id number]       [address]       [place], [place]  [address]       Ph:  [phone] or [phone]       Fax: [phone]   RxID:   [id number]    Orders Added: 1)  Est. Patient Level IV [34742]     Prevention & Chronic Care Immunizations   Influenza vaccine: Fluvax MCR  (07/14/2010)    Tetanus booster: Not documented   Td booster deferral: Deferred  (07/14/2010)    Pneumococcal vaccine: Not documented   Pneumococcal vaccine deferral: Deferred  (09/13/2010)    H. zoster vaccine: Not documented   H. zoster vaccine deferral: Not available  (07/14/2010)  Colorectal Screening   Hemoccult: Not documented   Hemoccult action/deferral: Deferred  (09/13/2010)    Colonoscopy: Not documented   Colonoscopy action/deferral: Deferred  (10/13/2010)  Other Screening   PSA: Not documented   PSA action/deferral: Discussed-PSA declined  (10/13/2010)   Smoking status: quit  (10/13/2010)  Lipids   Total Cholesterol: 196  (09/13/2010)   Lipid panel action/deferral: Lipid Panel ordered   LDL: 115  (09/13/2010)   LDL Direct: Not documented   HDL: 45  (09/13/2010)   Triglycerides: 181  (09/13/2010)    SGOT (AST): 16  (09/13/2010)   BMP action: Ordered   SGPT (ALT): 13  (09/13/2010)   Alkaline phosphatase: 76  (09/13/2010)   Total bilirubin: 0.8  (09/13/2010)    Lipid flowsheet reviewed?: Yes   Progress toward LDL goal: Unchanged  Hypertension   Last Blood Pressure: 140 / 82  (10/13/2010)   Serum creatinine: 0.72  (09/13/2010)   BMP action: Ordered   Serum potassium 4.1  (09/13/2010)    Hypertension flowsheet reviewed?: Yes   Progress toward BP goal: At goal  Self-Management Support :   Personal Goals (by the next clinic visit) :      Personal blood pressure goal: 130/80  (08/31/2009)     Personal LDL goal: 100  (08/31/2009)    Patient will work on the following items until the next clinic visit to reach self-care goals:     Medications and  monitoring: take my medicines every day, bring all of my medications to every visit  (10/13/2010)      Eating: eat more vegetables, eat foods that are low in salt, eat baked foods instead of fried foods, limit or avoid alcohol  (10/13/2010)     Activity: take a 30 minute walk every day  (10/13/2010)    Hypertension self-management support: Resources for patients handout, Written self-care plan  (10/13/2010)   Hypertension self-care plan printed.    Lipid self-management support: Resources for patients handout, Written self-care plan  (10/13/2010)   Lipid self-care plan printed.      Resource handout printed.

## 2010-11-09 NOTE — Progress Notes (Signed)
Summary: refill/ hla  Phone Note Refill Request Message from:  Fax from Pharmacy on August 21, 2010 2:22 PM  Refills Requested: Medication #1:  LOSARTAN POTASSIUM 50 MG TABS Take 1 tablet by mouth once a day   Dosage confirmed as above?Dosage Confirmed   Last Refilled: 7/6 last visit 10/31, last labs 03/2010  Initial call taken by: Marin Roberts RN,  August 21, 2010 2:22 PM  Follow-up for Phone Call       Follow-up by: Clerance Lav MD,  August 21, 2010 3:06 PM    Prescriptions: LOSARTAN POTASSIUM 50 MG TABS (LOSARTAN POTASSIUM) Take 1 tablet by mouth once a day  #30 x 3   Entered and Authorized by:   Clerance Lav MD   Signed by:   Clerance Lav MD on 08/21/2010   Method used:   Electronically to        [id number]       [address]       [place], [place]  [address]       Ph: [phone] or [phone]       Fax: [phone]   RxID:   [id number]

## 2010-11-09 NOTE — Letter (Signed)
Summary: BLOOD SUGAR 07-05-2010-07-14-2010  BLOOD SUGAR 07-05-2010-07-14-2010   Imported By: Margie Billet 07/20/2010 15:36:40  _____________________________________________________________________  External Attachment:    Type:   Image     Comment:   External Document

## 2010-11-23 ENCOUNTER — Inpatient Hospital Stay (HOSPITAL_COMMUNITY)
Admission: EM | Admit: 2010-11-23 | Discharge: 2010-11-26 | DRG: 195 | Disposition: A | Discharge status: Home Health | Payer: Medicare Other | Attending: Internal Medicine | Admitting: Internal Medicine

## 2010-11-23 ENCOUNTER — Encounter: Payer: Self-pay | Admitting: Internal Medicine

## 2010-11-23 ENCOUNTER — Emergency Department (HOSPITAL_COMMUNITY): Payer: Medicare Other

## 2010-11-23 DIAGNOSIS — J189 Pneumonia, unspecified organism: Principal | ICD-10-CM | POA: Diagnosis present

## 2010-11-23 DIAGNOSIS — D72829 Elevated white blood cell count, unspecified: Secondary | ICD-10-CM | POA: Diagnosis present

## 2010-11-23 DIAGNOSIS — G4733 Obstructive sleep apnea (adult) (pediatric): Secondary | ICD-10-CM | POA: Diagnosis present

## 2010-11-23 DIAGNOSIS — I10 Essential (primary) hypertension: Secondary | ICD-10-CM | POA: Diagnosis present

## 2010-11-23 DIAGNOSIS — I2510 Atherosclerotic heart disease of native coronary artery without angina pectoris: Secondary | ICD-10-CM | POA: Diagnosis present

## 2010-11-23 DIAGNOSIS — E785 Hyperlipidemia, unspecified: Secondary | ICD-10-CM | POA: Diagnosis present

## 2010-11-23 DIAGNOSIS — Z7982 Long term (current) use of aspirin: Secondary | ICD-10-CM

## 2010-11-23 DIAGNOSIS — H409 Unspecified glaucoma: Secondary | ICD-10-CM | POA: Diagnosis present

## 2010-11-23 DIAGNOSIS — Z9861 Coronary angioplasty status: Secondary | ICD-10-CM

## 2010-11-23 LAB — POCT I-STAT 3, ART BLOOD GAS (G3+)
Bicarbonate: 26 mEq/L — ABNORMAL HIGH (ref 20.0–24.0)
Patient temperature: 99.5
TCO2: 27 mmol/L (ref 0–100)
pH, Arterial: 7.452 — ABNORMAL HIGH (ref 7.350–7.450)

## 2010-11-23 LAB — URINE MICROSCOPIC-ADD ON

## 2010-11-23 LAB — CARDIAC PANEL(CRET KIN+CKTOT+MB+TROPI): Troponin I: 0.04 ng/mL (ref 0.00–0.06)

## 2010-11-23 LAB — POCT CARDIAC MARKERS
Myoglobin, poc: 182 ng/mL (ref 12–200)
Troponin i, poc: <0.05 ng/mL (ref 0.00–0.09)

## 2010-11-23 LAB — DIFFERENTIAL
Eosinophils Absolute: 0.1 10*3/uL (ref 0.0–0.7)
Monocytes Relative: 10 % (ref 3–12)

## 2010-11-23 LAB — URINALYSIS, ROUTINE W REFLEX MICROSCOPIC
Bilirubin Urine: NEGATIVE
Hgb urine dipstick: NEGATIVE
Leukocytes, UA: NEGATIVE
Protein, ur: 30 mg/dL — AB
Specific Gravity, Urine: 1.014 (ref 1.005–1.030)

## 2010-11-23 LAB — HEMOGLOBIN A1C
Hgb A1c MFr Bld: 5.4 % (ref <5.7)
Mean Plasma Glucose: 108 mg/dL (ref <117)

## 2010-11-23 LAB — RAPID URINE DRUG SCREEN, HOSP PERFORMED
Barbiturates: NOT DETECTED
Benzodiazepines: NOT DETECTED
Cocaine: NOT DETECTED

## 2010-11-23 LAB — CBC: RBC: 4.8 MIL/uL (ref 4.22–5.81)

## 2010-11-23 LAB — COMPREHENSIVE METABOLIC PANEL
ALT: 17 U/L (ref 0–53)
AST: 14 U/L (ref 0–37)
Chloride: 105 mEq/L (ref 96–112)
GFR calc Af Amer: >60 mL/min (ref >60)
Glucose, Bld: 126 mg/dL — ABNORMAL HIGH (ref 70–99)
Sodium: 139 mEq/L (ref 135–145)
Total Bilirubin: 1 mg/dL (ref 0.3–1.2)

## 2010-11-23 LAB — APTT: aPTT: 33 seconds (ref 24–37)

## 2010-11-23 LAB — BRAIN NATRIURETIC PEPTIDE: Pro B Natriuretic peptide (BNP): 432 pg/mL — ABNORMAL HIGH (ref 0.0–100.0)

## 2010-11-23 LAB — PROTIME-INR: INR: 1.04 (ref 0.00–1.49)

## 2010-11-23 LAB — CK TOTAL AND CKMB (NOT AT ARMC): Relative Index: INVALID (ref 0.0–2.5)

## 2010-11-23 LAB — STREP PNEUMONIAE URINARY ANTIGEN: Strep Pneumo Urinary Antigen: NEGATIVE

## 2010-11-24 ENCOUNTER — Inpatient Hospital Stay (HOSPITAL_COMMUNITY): Payer: Medicare Other

## 2010-11-24 DIAGNOSIS — R0602 Shortness of breath: Secondary | ICD-10-CM

## 2010-11-24 LAB — DIFFERENTIAL
Basophils Absolute: 0 10*3/uL (ref 0.0–0.1)
Lymphocytes Relative: 14 % (ref 12–46)
Neutro Abs: 7.6 10*3/uL (ref 1.7–7.7)
Neutrophils Relative %: 72 % (ref 43–77)

## 2010-11-24 LAB — CBC
HCT: 40.3 % (ref 39.0–52.0)
Hemoglobin: 13.5 g/dL (ref 13.0–17.0)
RBC: 4.59 MIL/uL (ref 4.22–5.81)
WBC: 10.5 10*3/uL (ref 4.0–10.5)

## 2010-11-24 LAB — BASIC METABOLIC PANEL
CO2: 25 mEq/L (ref 19–32)
Glucose, Bld: 111 mg/dL — ABNORMAL HIGH (ref 70–99)
Potassium: 3.2 mEq/L — ABNORMAL LOW (ref 3.5–5.1)
Sodium: 139 mEq/L (ref 135–145)

## 2010-11-24 LAB — CARDIAC PANEL(CRET KIN+CKTOT+MB+TROPI)
CK, MB: 2.1 ng/mL (ref 0.3–4.0)
Total CK: 42 U/L (ref 7–232)

## 2010-11-24 LAB — LEGIONELLA ANTIGEN, URINE: Legionella Antigen, Urine: NEGATIVE

## 2010-11-25 LAB — BASIC METABOLIC PANEL
BUN: 16 mg/dL (ref 6–23)
CO2: 27 mEq/L (ref 19–32)
GFR calc non Af Amer: >60 mL/min (ref >60)
Glucose, Bld: 113 mg/dL — ABNORMAL HIGH (ref 70–99)
Potassium: 3.8 mEq/L (ref 3.5–5.1)

## 2010-11-26 LAB — BASIC METABOLIC PANEL
BUN: 24 mg/dL — ABNORMAL HIGH (ref 6–23)
Chloride: 108 mEq/L (ref 96–112)
Creatinine, Ser: 1.07 mg/dL (ref 0.4–1.5)
Glucose, Bld: 118 mg/dL — ABNORMAL HIGH (ref 70–99)

## 2010-11-28 ENCOUNTER — Inpatient Hospital Stay (HOSPITAL_COMMUNITY)
Admission: EM | Admit: 2010-11-28 | Discharge: 2010-12-05 | DRG: 291 | Disposition: A | Discharge status: Skilled Nursing Facility | Payer: Medicare Other | Attending: Internal Medicine | Admitting: Internal Medicine

## 2010-11-28 ENCOUNTER — Inpatient Hospital Stay (HOSPITAL_COMMUNITY): Payer: Medicare Other

## 2010-11-28 ENCOUNTER — Emergency Department (HOSPITAL_COMMUNITY): Payer: Medicare Other

## 2010-11-28 ENCOUNTER — Encounter: Payer: Self-pay | Admitting: Internal Medicine

## 2010-11-28 DIAGNOSIS — Z79899 Other long term (current) drug therapy: Secondary | ICD-10-CM

## 2010-11-28 DIAGNOSIS — R0902 Hypoxemia: Secondary | ICD-10-CM | POA: Diagnosis present

## 2010-11-28 DIAGNOSIS — I498 Other specified cardiac arrhythmias: Secondary | ICD-10-CM | POA: Diagnosis not present

## 2010-11-28 DIAGNOSIS — I509 Heart failure, unspecified: Secondary | ICD-10-CM | POA: Diagnosis present

## 2010-11-28 DIAGNOSIS — I4729 Other ventricular tachycardia: Secondary | ICD-10-CM | POA: Diagnosis not present

## 2010-11-28 DIAGNOSIS — R5381 Other malaise: Secondary | ICD-10-CM | POA: Diagnosis present

## 2010-11-28 DIAGNOSIS — I5043 Acute on chronic combined systolic (congestive) and diastolic (congestive) heart failure: Principal | ICD-10-CM | POA: Diagnosis present

## 2010-11-28 DIAGNOSIS — K5730 Diverticulosis of large intestine without perforation or abscess without bleeding: Secondary | ICD-10-CM | POA: Diagnosis present

## 2010-11-28 DIAGNOSIS — G4733 Obstructive sleep apnea (adult) (pediatric): Secondary | ICD-10-CM | POA: Diagnosis present

## 2010-11-28 DIAGNOSIS — H409 Unspecified glaucoma: Secondary | ICD-10-CM | POA: Diagnosis present

## 2010-11-28 DIAGNOSIS — I10 Essential (primary) hypertension: Secondary | ICD-10-CM | POA: Diagnosis present

## 2010-11-28 DIAGNOSIS — Z9861 Coronary angioplasty status: Secondary | ICD-10-CM

## 2010-11-28 DIAGNOSIS — J189 Pneumonia, unspecified organism: Secondary | ICD-10-CM | POA: Diagnosis present

## 2010-11-28 DIAGNOSIS — Z7982 Long term (current) use of aspirin: Secondary | ICD-10-CM

## 2010-11-28 DIAGNOSIS — I4891 Unspecified atrial fibrillation: Secondary | ICD-10-CM | POA: Diagnosis present

## 2010-11-28 DIAGNOSIS — I2510 Atherosclerotic heart disease of native coronary artery without angina pectoris: Secondary | ICD-10-CM | POA: Diagnosis present

## 2010-11-28 DIAGNOSIS — E785 Hyperlipidemia, unspecified: Secondary | ICD-10-CM | POA: Diagnosis present

## 2010-11-28 DIAGNOSIS — I472 Ventricular tachycardia, unspecified: Secondary | ICD-10-CM | POA: Diagnosis not present

## 2010-11-28 LAB — DIFFERENTIAL
Basophils Relative: 0 % (ref 0–1)
Eosinophils Absolute: 0.1 10*3/uL (ref 0.0–0.7)
Eosinophils Relative: 1 % (ref 0–5)
Lymphs Abs: 1.2 10*3/uL (ref 0.7–4.0)
Monocytes Absolute: 1.1 10*3/uL — ABNORMAL HIGH (ref 0.1–1.0)
Monocytes Relative: 8 % (ref 3–12)
Neutrophils Relative %: 82 % — ABNORMAL HIGH (ref 43–77)

## 2010-11-28 LAB — BASIC METABOLIC PANEL
BUN: 25 mg/dL — ABNORMAL HIGH (ref 6–23)
Calcium: 9 mg/dL (ref 8.4–10.5)
Creatinine, Ser: 1.01 mg/dL (ref 0.4–1.5)
GFR calc Af Amer: >60 mL/min (ref >60)

## 2010-11-28 LAB — HEPATIC FUNCTION PANEL
AST: 17 U/L (ref 0–37)
Albumin: 2.7 g/dL — ABNORMAL LOW (ref 3.5–5.2)
Alkaline Phosphatase: 55 U/L (ref 39–117)
Bilirubin, Direct: 0.2 mg/dL (ref 0.0–0.3)
Total Bilirubin: 0.7 mg/dL (ref 0.3–1.2)

## 2010-11-28 LAB — CK TOTAL AND CKMB (NOT AT ARMC)
Relative Index: INVALID (ref 0.0–2.5)
Total CK: 28 U/L (ref 7–232)

## 2010-11-28 LAB — CBC
MCH: 29.5 pg (ref 26.0–34.0)
MCHC: 32.9 g/dL (ref 30.0–36.0)
MCV: 89.8 fL (ref 78.0–100.0)
Platelets: 350 10*3/uL (ref 150–400)
RDW: 14.7 % (ref 11.5–15.5)

## 2010-11-28 LAB — CARDIAC PANEL(CRET KIN+CKTOT+MB+TROPI): Relative Index: INVALID (ref 0.0–2.5)

## 2010-11-28 LAB — URINALYSIS, ROUTINE W REFLEX MICROSCOPIC
Hgb urine dipstick: NEGATIVE
Ketones, ur: NEGATIVE mg/dL
Protein, ur: NEGATIVE mg/dL
Urine Glucose, Fasting: NEGATIVE mg/dL
pH: 5 (ref 5.0–8.0)

## 2010-11-29 LAB — CULTURE, BLOOD (ROUTINE X 2)
Culture  Setup Time: 201202161907
Culture: NO GROWTH

## 2010-11-29 LAB — URINE CULTURE: Culture: NO GROWTH

## 2010-11-29 LAB — BASIC METABOLIC PANEL
BUN: 22 mg/dL (ref 6–23)
CO2: 27 mEq/L (ref 19–32)
Calcium: 9 mg/dL (ref 8.4–10.5)
Chloride: 106 mEq/L (ref 96–112)
Creatinine, Ser: 0.93 mg/dL (ref 0.4–1.5)
GFR calc Af Amer: >60 mL/min (ref >60)
Glucose, Bld: 99 mg/dL (ref 70–99)

## 2010-11-29 LAB — DIFFERENTIAL
Basophils Relative: 0 % (ref 0–1)
Lymphocytes Relative: 8 % — ABNORMAL LOW (ref 12–46)
Lymphs Abs: 1.1 10*3/uL (ref 0.7–4.0)
Monocytes Absolute: 1.1 10*3/uL — ABNORMAL HIGH (ref 0.1–1.0)
Monocytes Relative: 8 % (ref 3–12)
Neutro Abs: 10.9 10*3/uL — ABNORMAL HIGH (ref 1.7–7.7)
Neutrophils Relative %: 83 % — ABNORMAL HIGH (ref 43–77)

## 2010-11-29 LAB — CBC
HCT: 40.5 % (ref 39.0–52.0)
Hemoglobin: 13.4 g/dL (ref 13.0–17.0)
MCH: 29.5 pg (ref 26.0–34.0)
MCHC: 33.1 g/dL (ref 30.0–36.0)
MCV: 89 fL (ref 78.0–100.0)
RBC: 4.55 MIL/uL (ref 4.22–5.81)

## 2010-11-29 NOTE — Initial Assessments (Signed)
INTERNAL MEDICINE ADMISSION HISTORY AND PHYSICAL  Attending: Dr. Doneen Poisson First contact: Dr. Claudette Laws [phone] Second contact: Dr. Gilford Rile [phone] Jacquelyne Balint, after-hours: [phone], [phone])  PCP: Dr. Sherryll Burger  CC: SOB  HPI: Pt is a 77 year old male with PMH of HTN, CAD s/p stenting, OSA and HTNwho who came to ED for SOB. His SOB statrted about 3-4 days ago, became more worse, no cough, fever, chill, sweating, running nose, chest pain, or worsening leg edema. He has been using CPAP at home and no oxygen. He denies abdominal pain, diarrhea, melena, or dysuria, or leg pain. He has bilateral leg swelling for several years. Denies smoking or EDTOH or drug abuse. Denies sick contact.   In ED h was given BiPAP and his SOB better and changed to N/C O2.  ALLERGIES: ! PCN - rash  PAST MEDICAL HISTORY: CAD s/p stents (9 per pt) [address]62   -D-ECHO (07/2010): The estimated ejection fraction was in the range of 50% to 55%. There is severe hypokinesis of the basal-mid inferior myocardium; consistent with infarction in the distribution of the right coronary artery  Obstructive Sleep Apnea on CPAP Hyperlipidemia Hypertriglyceridemia Hypertension Glucoma Diverticulosis with bleeding   MEDICATIONS: AMLODIPINE BESYLATE 10 MG TABS (AMLODIPINE BESYLATE) Take 1 tablet by mouth once a day LOSARTAN POTASSIUM 50 MG TABS (LOSARTAN POTASSIUM) Take 1 tablet by mouth once a day SIMVASTATIN 20 MG TABS (SIMVASTATIN) Take one pill by mouth at bedtime for high cholesterol. * CPAP everynight for sleep apnea COREG 3.125 MG TABS (CARVEDILOL) take one tablet two times a day TRAVATAN Z 0.004 % SOLN (TRAVOPROST) One drop in right eye once at bedtime LOTEMAX 0.5 % SUSP (LOTEPREDNOL ETABONATE) One drop in right eye once daily CLOTRIMAZOLE 1 % CREA (CLOTRIMAZOLE) apply to affected area two times a day BAYER ASPIRIN EC LOW DOSE 81 MG TBEC (ASPIRIN) Take 1 tablet by mouth once a day COLACE 100 MG CAPS (DOCUSATE SODIUM)  take one tab by mouth two times a day for constipation   SOCIAL HISTORY: Lives by himself in [place] Is a veteran His grandson from [place], [place] and his grandson's wife live near him and they often goes to his house.      FAMILY HISTORY Mother died of ovarian cancer @age  of 55 and father died of spinal tumor@ age of 51  ROS: No chest pain, abdominal pain, diarrhea, melena or dysuria.  VITALS: T:  97.8     P:  84     BP:  151/87   R: 20    O2SAT:  94% XB:MWUXL, then 99% on RA.   PHYSICAL EXAM: GEN: NAD HEENT: PERRL, EOMI, no icterus, no pallor NECK: supple, no JVD, no cervical LN LUNGS: clear to auscultation bilaterally, mild wheezing and bibasilar crackles.  CVS: RRR, no murmur/rubs/gallops ABD: Soft, non-tender/distension, normal bowel sounds. EXTREMITIES: 1-2+ pitting edema bilaterally NEURO: Alert &Oriented x3, no focal motor or sensory deficits.     LABS:  WBC                                      12.2       h      4.0-10.5         K/uL  RBC  4.80              4.22-5.81        MIL/uL  Hemoglobin (HGB)                         14.4              13.0-17.0        g/dL  Hematocrit (HCT)                         42.2              39.0-52.0        %  MCV                                      87.9              78.0-100.0       fL  MCH -                                    30.0              26.0-34.0        pg  MCHC                                     34.1              30.0-36.0        g/dL  RDW                                      14.4              11.5-15.5        %  Platelet Count (PLT)                     253               150-400          K/uL  Neutrophils, %                           81         h      43-77            %  Lymphocytes, %                           8          l      12-46            %  Monocytes, %                             10                3-12             %  Eosinophils, %  1                 0-5               %  Basophils, %                             0                 0-1              %  Neutrophils, Absolute                    9.9        h      1.7-7.7          K/uL  Lymphocytes, Absolute                    0.9               0.7-4.0          K/uL  Monocytes, Absolute                      1.3        h      0.1-1.0          K/uL  Eosinophils, Absolute                    0.1               0.0-0.7          K/uL  Basophils, Absolute                      0.0               0.0-0.1          K/uL  Sodium (NA)                              139               135-145          mEq/L  Potassium (K)                            3.7               3.5-5.1          mEq/L  Chloride                                 105               96-112           mEq/L  CO2                                      23                19-32            mEq/L  Glucose  126        h      70-99            mg/dL  BUN                                      12                6-23             mg/dL  Creatinine                               0.81              0.4-1.5          mg/dL  GFR, Est Non African American            >60               >60              mL/min  GFR, Est African American                >60               >60              mL/min    Oversized comment, see footnote  1  Bilirubin, Total                         1.0               0.3-1.2          mg/dL  Alkaline Phosphatase                     62                39-117           U/L  SGOT (AST)                               14                0-37             U/L  SGPT (ALT)                               17                0-53             U/L  Total  Protein                           6.4               6.0-8.3          g/dL  Albumin-Blood                            3.1        l      3.5-5.2          g/dL  Calcium                                  9.0               8.4-10.5         mg/dL   Protime ( Prothrombin Time)              13.8              11.6-15.2         seconds  INR                                      1.04              0.00-1.49 PTT(a-Partial Thromboplastn Time)        33                24-37            seconds  Beta Natriuretic Peptide                 432.0      h      0.0-100.0        pg/mL   CKMB, POC                                1.4               1.0-8.0          ng/mL  Troponin I, POC                          <0.05             0.00-0.09        ng/mL  Myoglobin, POC                           182               12-200           ng/mL   pH, Blood Gas                            7.452      h      7.350-7.450  pCO2                                     37.4              35.0-45.0        mmHg  pO2, Blood Gas                           110.0      h      80.0-100.0       mmHg  Bicarbonate                              26.0       h  20.0-24.0        mEq/L  TCO2                                     27                0-100            mmol/L  Acid-Base Excess                         2.0               0.0-2.0          mmol/L  Oxygen Saturation                        98.0                               %  Collection Site                          SEE NOTE.     RADIAL, ALLEN'S TEST ACCEPTABLE   Color, Urine                             YELLOW            YELLOW  Appearance                               CLEAR             CLEAR  Specific Gravity                         1.014             1.005-1.030  pH                                       5.0               5.0-8.0  Urine Glucose                            NEGATIVE          NEG              mg/dL  Bilirubin                                NEGATIVE          NEG  Ketones                                  NEGATIVE          NEG              mg/dL  Blood  NEGATIVE          NEG  Protein                                  30         a      NEG              mg/dL  Urobilinogen                             0.2               0.0-1.0          mg/dL  Nitrite                                   NEGATIVE          NEG  Leukocytes                               NEGATIVE          NEG  Squamous Epithelial / LPF                RARE              RARE  Bacteria / HPF                           RARE              RARE     ASSESSMENT AND PLAN:  1. Productive cough with SOB: He only has SOB, but no fever, cough, CP. CBC shows leukocytosis and CXR shows bilateral lung infiltrates. So he likely has CAP(atypical likely).  but  other possible causes include CAD, CHF or responsive airway disease may contribute to this.  PE  or PTX less likely. Will admit to Tele.   Plans: - Start avelox - Blood Cx and sputum Cx as well as urine strep and legionella - Start brochodilators - Start lasix and strict I&O's - symptom control with tylenol - CXR in AM  2. HTN: Will continue his home HTN meds and has added lasix for leg swelling. Monitor vitals and adjust meds as needed.   3. CAD s/p stenting: He has CAD with stenting, no chest pain. BNP higher and has recent 2D-ECHO EF 50-55% with hypokinesis.  - Check cyclic CEs as well as EKG.  - Start ASA, low dose coreg, statin and control HTN.   4. HLD: Will continue home statin.   5. PROPH: lovenox and protonix   Jackson Latino, PGY-3 [phone]_________________  ATTENDING: I performed and/or observed a history and physical examination of the patient.  I discussed the case with the residents as noted and reviewed the residents' notes.  I agree with the findings and plan--please refer to the attending physician note for more details.  Signature________________________________  Printed Name_____________________________

## 2010-11-30 DIAGNOSIS — R0602 Shortness of breath: Secondary | ICD-10-CM

## 2010-11-30 LAB — CARDIAC PANEL(CRET KIN+CKTOT+MB+TROPI)
CK, MB: 2.4 ng/mL (ref 0.3–4.0)
CK, MB: 1.9 ng/mL (ref 0.3–4.0)
Relative Index: INVALID (ref 0.0–2.5)
Total CK: 25 U/L (ref 7–232)
Troponin I: 0.03 ng/mL (ref 0.00–0.06)
Troponin I: 0.04 ng/mL (ref 0.00–0.06)
Troponin I: 0.03 ng/mL (ref 0.00–0.06)

## 2010-11-30 LAB — MAGNESIUM: Magnesium: 2.1 mg/dL (ref 1.5–2.5)

## 2010-11-30 LAB — BASIC METABOLIC PANEL
BUN: 27 mg/dL — ABNORMAL HIGH (ref 6–23)
BUN: 25 mg/dL — ABNORMAL HIGH (ref 6–23)
CO2: 29 mEq/L (ref 19–32)
Calcium: 8.9 mg/dL (ref 8.4–10.5)
Chloride: 100 mEq/L (ref 96–112)
Chloride: 100 mEq/L (ref 96–112)
Creatinine, Ser: 1.05 mg/dL (ref 0.4–1.5)
GFR calc Af Amer: >60 mL/min (ref >60)
GFR calc non Af Amer: >60 mL/min (ref >60)
Potassium: 3.6 mEq/L (ref 3.5–5.1)

## 2010-12-01 DIAGNOSIS — R5381 Other malaise: Secondary | ICD-10-CM

## 2010-12-01 DIAGNOSIS — J159 Unspecified bacterial pneumonia: Secondary | ICD-10-CM

## 2010-12-01 LAB — BASIC METABOLIC PANEL
BUN: 26 mg/dL — ABNORMAL HIGH (ref 6–23)
Calcium: 9 mg/dL (ref 8.4–10.5)
Creatinine, Ser: 1.04 mg/dL (ref 0.4–1.5)
GFR calc Af Amer: >60 mL/min (ref >60)
GFR calc non Af Amer: >60 mL/min (ref >60)

## 2010-12-01 LAB — MAGNESIUM: Magnesium: 2.5 mg/dL (ref 1.5–2.5)

## 2010-12-02 LAB — BASIC METABOLIC PANEL
BUN: 30 mg/dL — ABNORMAL HIGH (ref 6–23)
Calcium: 9.4 mg/dL (ref 8.4–10.5)
Creatinine, Ser: 1.03 mg/dL (ref 0.4–1.5)
GFR calc non Af Amer: >60 mL/min (ref >60)

## 2010-12-03 LAB — BASIC METABOLIC PANEL
CO2: 35 mEq/L — ABNORMAL HIGH (ref 19–32)
Calcium: 9.1 mg/dL (ref 8.4–10.5)
Creatinine, Ser: 1.25 mg/dL (ref 0.4–1.5)
GFR calc Af Amer: >60 mL/min (ref >60)
GFR calc non Af Amer: 55 mL/min — ABNORMAL LOW (ref >60)

## 2010-12-04 DIAGNOSIS — R0602 Shortness of breath: Secondary | ICD-10-CM

## 2010-12-04 LAB — BASIC METABOLIC PANEL
Calcium: 8.8 mg/dL (ref 8.4–10.5)
GFR calc Af Amer: >60 mL/min (ref >60)
GFR calc non Af Amer: 55 mL/min — ABNORMAL LOW (ref >60)
Glucose, Bld: 91 mg/dL (ref 70–99)
Sodium: 141 mEq/L (ref 135–145)

## 2010-12-05 LAB — BASIC METABOLIC PANEL
BUN: 43 mg/dL — ABNORMAL HIGH (ref 6–23)
GFR calc non Af Amer: 53 mL/min — ABNORMAL LOW (ref >60)
Potassium: 4.5 mEq/L (ref 3.5–5.1)

## 2010-12-05 NOTE — Initial Assessments (Signed)
INTERNAL MEDICINE ADMISSION HISTORY AND PHYSICAL  Attending: Dr. Doneen Poisson First contact: Dr. Claudette Laws [phone] Second contact: Dr. Gilford Rile [phone] Jacquelyne Balint, after-hours: [phone], [phone])  PCP: Dr. Sherryll Burger  CC: SOB  HPI: Pt is a 77 year old male with PMH of HTN, CAD s/p stenting, OSA and HTN who who came to ED for SOB. He was discharged  2 days ago from [facility] for bilateral PNA and he started SOB last night, no cough, fever, chill, sweating, running nose, chest pain. So he went to ED. He has been using CPAP at home and has not got home oxygen. He denies abdominal pain, diarrhea, melena, or dysuria, or leg pain. He has bilateral leg swelling for several years. Denies smoking or EDTOH or drug abuse. Denies sick contact.   In ED h was given BiPAP as well as brochodilatorsv and changed to N/C O2 soon and his SOB got better.   ALLERGIES: ! PCN - rash  PAST MEDICAL HISTORY: Bilateral pneumonia, community acquired. CAD s/p stents (9 per pt) 1991   -The estimated EF 40% to 45%. Akinesis and scarring of the entire inferolateral and inferior myocardium. Features are consistent with a pseudonormal left ventricular filling pattern, with  concomitant abnormal relaxation and increased filling pressure (grade 2 diastolic dysfunction). Doppler parameters are consistent with elevated mean left atrial filling pressure.   Obstructive Sleep Apnea on CPAP Hyperlipidemia Hypertriglyceridemia Hypertension Glucoma Diverticulosis with bleeding   MEDICATIONS: 1. Carvedilol 12.5 mg tablets by mouth twice daily. 2. Prednisone taper for 10 days starting with four tablets on day 1,     three tablets for day 2, 3, 4, two tablets for day 5, 6, 7, one     tablet for day 8, 9, 10, and then stopping it. 3. Simvastatin 20 mg p.o. daily at nighttime. 4. Advil 200 mg tablets by mouth daily at bedtime. 5. Amlodipine 10 mg tablets 1 tablet by mouth every morning. 6. Aspirin 81 mg tablets 1 tablet by mouth every  evening. 7. Clotrimazole cream one application topically twice a day as needed. 8. Docusate 100 mg capsules twice daily. 9. Losartan 50 mg tablets by mouth every evening. 10.Lotemax 0.5% 1 drop in the right eye every morning. 11.Travatan ophthalmic 0.004% 1 drop in the right eye daily at     bedtime. 12.Avelox 400 mg tablets p.o. daily for 4 days for community-acquired     pneumonia.   SOCIAL HISTORY: Lives by himself in [place] Is a veteran His grandson from [place], [place] and his grandson's wife live near him and they often goes to his house.      FAMILY HISTORY Mother died of ovarian cancer @age  of 71 and father died of spinal tumor@ age of 81  ROS: No chest pain, abdominal pain, diarrhea, melena or dysuria.  VITALS: T:  97.5      P:  74     BP:  151/67>128/73     R: 28>20   O2SAT:  100% HY:QMVHQ. then 94% on 4L N/C   PHYSICAL EXAM: GEN: NAD HEENT: PERRL, EOMI, no icterus, no pallor NECK: supple, no JVD, no cervical LN LUNGS: clear to auscultation bilaterally, mild to moderate wheezing and fine bibasilar crackles.  CVS: RRR, no murmur/rubs/gallops ABD: Soft, non-tender/distension, normal bowel sounds. EXTREMITIES: 2+ leg pitting edema bilaterally NEURO: Alert &Oriented x3, no focal motor or sensory deficits.     LABS:   WBC  13.9       h      4.0-10.5         K/uL  RBC                                      5.01              4.22-5.81        MIL/uL  Hemoglobin (HGB)                         14.8              13.0-17.0        g/dL  Hematocrit (HCT)                         45.0              39.0-52.0        %  MCV                                      89.8              78.0-100.0       fL  MCH -                                    29.5              26.0-34.0        pg  MCHC                                     32.9              30.0-36.0        g/dL  RDW                                      14.7              11.5-15.5        %  Platelet Count  (PLT)                     350               150-400          K/uL  Neutrophils, %                           82         h      43-77            %  Lymphocytes, %                           9          l      12-46            %  Monocytes, %  8                 3-12             %  Eosinophils, %                           1                 0-5              %  Basophils, %                             0                 0-1              %  Neutrophils, Absolute                    11.4       h      1.7-7.7          K/uL  Lymphocytes, Absolute                    1.2               0.7-4.0          K/uL  Monocytes, Absolute                      1.1        h      0.1-1.0          K/uL  Eosinophils, Absolute                    0.1               0.0-0.7          K/uL  Basophils, Absolute                      0.1               0.0-0.1          K/uL   Sodium (NA)                              143               135-145          mEq/L  Potassium (K)                            3.5               3.5-5.1          mEq/L  Chloride                                 111               96-112           mEq/L  CO2  24                19-32            mEq/L  Glucose                                  125        h      70-99            mg/dL  BUN                                      25         h      6-23             mg/dL  Creatinine                               1.01              0.4-1.5          mg/dL  GFR, Est Non African American            >60               >60              mL/min  GFR, Est African American                >60               >60              mL/min    Oversized comment, see footnote  1  Calcium                                  9.0               8.4-10.5         mg/dL  Creatine Kinase, Total                   28                7-232            U/L  CK, MB                                   5.2        h      0.3-4.0          ng/mL  Relative Index                            SEE NOTE.         0.0-2.5 Troponin I                               0.04              0.00-0.06        ng/mL   PORTABLE CHEST - 1 VIEW  Comparison: 02/17 and 11/23/2010    Findings: Again noted are the bilateral pulmonary infiltrates.   There is slight increased density at the right base since the prior   exam.    No other change.    IMPRESSION:     Persistent bilateral pulmonary infiltrates, perhaps slightly worse   at the right base.   1. SOB: He developed SOB after recent discharge and CBC shows increased WBC and CXR shows slight worsening lung infiltrates. He also has CHF and OSA. His symptom is likely due to multifatorial, including PNA, CHF and responsive airway disease may contribute to this.  PE  less likely. Will admit to Tele.   Plans: - change avelox to Vanco and cefepime as per worsening CXR he may resistant to it. - Start brochodilators - Start lasix 40 mg IV two times a day and strict I&O's - symptom control with tylenol - CXR in AM  2. HTN: Will continue his home HTN meds and has added lasix for leg swelling. Monitor vitals and adjust meds as needed.   3. CAD s/p stenting: He has CAD s/p stenting, no chest pain. BNP higher and has recent 2D-ECHO EF 40-45% with hypokinesis.  - Check BNP and cardiac enzymes - Start ASA, low dose coreg, statin and control HTN.   4. HLD: Will continue home statin.   5. PROPH: lovenox and protonix   Jackson Latino, PGY-3 [phone]_________________  ATTENDING: I performed and/or observed a history and physical examination of the patient.  I discussed the case with the residents as noted and reviewed the residents' notes.  I agree with the findings and plan--please refer to the attending physician note for more details.  Signature________________________________  Printed Name_____________________________

## 2010-12-15 NOTE — Discharge Summary (Signed)
NAMEBOE, Eugene [name]                ACCOUNT NO.:  [id number]  MEDICAL RECORD NO.:  [id number]           PATIENT TYPE:  I  LOCATION:  6727                         FACILITY:  [facility]  PHYSICIAN:  Doneen Poisson, MD     DATE OF BIRTH:  Nov 05, 1923  DATE OF ADMISSION:  11/28/2010 DATE OF DISCHARGE:  12/05/2010                              DISCHARGE SUMMARY   REFERRING PHYSICIAN:  Doneen Poisson, MD  DISCHARGE DIAGNOSES: 1. Systolic and diastolic congestive heart failure exacerbation. 2. Community acquired pneumonia. 3. Coronary artery disease status post stenting. 4. Hyperlipidemia. 5. Hypertension. 6. Obstructive sleep apnea. 7. Glaucoma. 8. Diverticulosis.  DISCHARGE MEDICATIONS: 1. Tylenol 650 mg by mouth every 6 hours as needed for pain. 2. Lasix 40 mg by mouth every morning, please weight yourself daily,     if your weight increases by 3-5 pounds over a baseline weight of     93 kg or 203 pounds, please take double your normal Lasix dose for     1 day and call your doctor for instructions. 3. Ibuprofen 200 mg by mouth daily at bedtime. 4. Aspirin 81 mg by mouth every morning. 5. Amlodipine 10 mg by mouth every morning. 6. Carvedilol 12.5 mg by mouth twice daily with meals. 7. Clotrimazole topical cream 1% topically twice daily. 8. Docusate 100 mg by mouth twice daily, hold for loose stool. 9. Losartan 50 mg by mouth every morning. 10.Lotemax 0.5% one drop right eye every morning. 11.Simvastatin 20 mg 1 tablet by mouth daily. 12.Travatan ophthalmic 0.004% 1 drop in right eye daily at bedtime.  DISPOSITION AND FOLLOW-UP:  Eugene [name] was discharged from [facility] on December 05, 2010, in stable and improved condition.  He was discharged to a skilled nursing facility to complete rehabilitation prior to returning home.  He was discharged with new instructions on how  to manage his heart failure including daily weights and as directed dosing  of Lasix.  At follow-up,  he should have a BMET drawn as well as confirmation  of compliance with medical regimen.  He is to follow-up with Dr. Cena Benton in the [facility] on March 22 at 2:15 p.m.  He is also to follow-up with Dr. Sherryll Burger, his PCP, in the [facility] on April 13 at 9:15 a.m.  Please note that he has full  capacity for his medical decison making and did designate a healthcare  power of attorney - his pastor [name] [name] [phone].  CONSULTATIONS:  None.  PROCEDURE PERFORMED:  Imaging  1.  November 28, 2010, portable chest x- ray showing persistent bilateral  pulmonary infiltrates presently worse at the right base  2.  Two-view chest x-ray November 28, 2010, unchanged right upper lobe  and left midlung airspace disease probably pneumonia and improved aeration  of the right lung base likely represents improved pulmonary edema and atelectasis.  ADMISSION HISTORY:  Eugene [name] is an 77 year old man with a past medical history of hypertension, coronary artery disease status post stenting, obstructive sleep apnea and hypertension who came to the emergency department for shortness of  breath.  He was discharged 2 days ago from [facility] for bilateral pneumonia and he started having shortness of breath last night.  He denies any cough, fevers, chills, sweating, runny nose or chest pain.  He came to the emergency department.  He is needing a CPAP at home and is not using any home oxygen.  He denies any abdominal pain, diarrhea, melena, dysuria or leg pain.  He has bilateral leg swelling for several years.  He denies smoking, alcohol drug use or sick contacts.  In the emergency department,  he was placed on BiPAP and started on bronchodilators prior to being switched to nasal cannula as soon as his shortness of breath improved.  ADMISSION VITALS:  Temperature 97.5, pulse 74, blood pressure 151/67, respiratory rate 28, O2 sat 100% on BiPAP and  94% on 4 L by nasal cannula.  ADMISSION PHYSICAL EXAMINATION: GENERAL:  No acute distress. HEENT: Pupils equal, round, and reactive to light.  Extraocular movements intact.  No icterus or pallor. NECK:  Supple.  No JVD or cervical lymphadenopathy. LUNGS:  Clear to auscultation bilaterally.  Mild-to-moderate wheezing and fine bibasilar crackles. CARDIOVASCULAR:  Regular rate and rhythm.  No murmurs, rubs, or gallops. ABDOMEN:  Soft, nontender, nondistended.  Normal bowel sounds. EXTREMITIES:  2+ pitting edema bilaterally. NEUROLOGIC:  Alert and oriented x3.  No focal motor or sensory deficits.  ADMISSION LABORATORY:   CBC:  White blood cell count 30.9, hemoglobin 14.8, hematocrit 45.0,  platelets 350. BMET:  Sodium 143, potassium 2.5, chloride 111, bicarb 24, BUN 25, creatinine 1.01, calcium 9.0.   Creatine kinase 28, CK-MB 5.2, troponin 0.04.  BNP 500.   Hepatic function panel normal except for a total protein 5.6 and  albumin of 2.7.   Urinalysis negative.  HOSPITAL COURSE BY PROBLEM: 1. Shortness of breath.  Mr. Wolbert developed shortness of breath     after being discharged recently, and after reviewing his home      weight, physical exam, and labs including BNP and CXR, it appeared that      the reason for his worsening SOB on presentation was exacerbation of his      known heart failure.  He also had an underlying atypical pneumonia for      which he was being treated from his earlier admission. He was aggressively     diuresed with Lasix 80 mg IV b.i.d. until we saw a bump in his     creatinine.  He has a baseline creatinine approximately 0.8-1.0 and his      Creatinine bumped to 1.25 2 days prior to discharge.  His weight fell      approximately 12 pounds to 203 pounds, his new "dry weight."  At that time      he was transitioned to oral lasix 40 mg daily.  Of note, while he was      fluid overloaded, he exhibited some temporary moments of confusion and      irritation.   However, these quickly resolved as he diuresed and his      saturation improved.  Also contributing to his shortness of breath was the      likely atypical CAP for which he was being treated on discharge from his      earlier admission.  Steroids and antibiotics were continued and he      completed a total of 12 days of steroids and 12 days of antibiotics.       On the day  of discharge, he was breathing well and saturating well on     room air and looking forward to rehabilitation and returning home as      he leads a very active life.  2. Asymptomatic short runs of ventricular tachycardia.  During the     hospitalization, he exhibited two different runs of ventricular      tachycardia, the first less than 39 beats and the second less than 29      beats.  He was asymptomatic during these episodes.  EKGs performed showed      no changes.  Cardiac enzymes were negative x 3.  He was asleep during      both of these episodes.  He does have a significant history of coronary      artery disease with stenting.  However, during the hospitalization, he      remained without chest pain and was continued on home aspirin and      beta-blocker.  3. Hypertension.  On admission, Mr. Swetz was mildly hypertensive.     However, this was likely secondary to fluid overload related to his     congestive heart failure exacerbation.  With diuresis, his BP control      improved.  4. Hyperlipidemia.  Mr. Asche' home statin was continued during this      hospitalization.  5. Code status.  Discussion was had with him regarding his code status and     he wished to be a full code.  However, he was clear that he would not      wish to be "on life support" including intubation for greater than a      short period of time, when he indicated to me to be the period of time      that the medical profession would need to determine that he would have      "only a small chance" of regaining his prior baseline.  During  this      hospitalization he designated his pastor, [name], his      healthcare power of attorney [phone], and wanted this noted in      his records.  DISCHARGE VITALS:  T 98.2  P 52  RR 18  BP 114/55, O2 sat 96% on RA  DISCHARGE LABORATORY DATA:  Na 143, K4.5, Cl 100, bicarb 36, BUN 43,  Cr 1.3, glucose 98   ______________________________ Danelle Berry, MD   ______________________________ Doneen Poisson, MD   JW/MEDQ  D:  12/04/2010  T:  12/04/2010  Job:  [id number]  cc: Dr. Sherryll Burger  Electronically Signed by Danelle Berry MD on 12/05/2010 09:42:59 PM Electronically Signed by Doneen Poisson  on 12/14/2010 10:11:57 AM

## 2010-12-15 NOTE — Discharge Summary (Signed)
Eugene [name], Eugene [name]                ACCOUNT NO.:  [id number]  MEDICAL RECORD NO.:  [id number]           PATIENT TYPE:  I  LOCATION:  4705                         FACILITY:  [facility]  PHYSICIAN:  Doneen Poisson, MD     DATE OF BIRTH:  08-Jan-1924  DATE OF ADMISSION:  11/23/2010 DATE OF DISCHARGE:  11/26/2010                              DISCHARGE SUMMARY   DISCHARGE DIAGNOSES: 1. Bilateral pneumonia, community acquired. 2. Hypertension. 3. Hyperlipidemia. 4. Coronary artery disease status post percutaneous transluminal     coronary angioplasty in 1991. 5. Glaucoma. 6. Obstructive sleep apnea on CPAP at home.  DISCHARGE MEDICATIONS: 1. Carvedilol 12.5 mg tablets by mouth twice daily. 2. Prednisone taper for 10 days starting with four tablets on day 1,     three tablets for day 2, 3, 4, two tablets for day 5, 6, 7, one     tablet for day 8, 9, 10, and then stopping. 3. Simvastatin 20 mg p.o. daily at nighttime. 4. Advil 200 mg tablets by mouth daily at bedtime. 5. Amlodipine 10 mg tablets 1 tablet by mouth every morning. 6. Aspirin 81 mg tablets 1 tablet by mouth every evening. 7. Clotrimazole cream one application topically twice a day as needed. 8. Docusate 100 mg capsules twice daily. 9. Losartan 50 mg tablets by mouth every evening. 10.Lotemax 0.5% 1 drop in the right eye every morning. 11.Travatan ophthalmic 0.004% 1 drop in the right eye daily at     bedtime. 12.Avelox 400 mg tablets p.o. daily for 4 days for community-acquired     pneumonia.  DISPOSITION AND FOLLOW-UP:  Eugene [name] on discharge is in good health. He does not complain of shortness of breath and is at baseline.  He is to continue p.o. antibiotics for another 4 days and continue the prednisone taper for another 10 days.  He is to follow up with Dr. Sherryll [name] on January 19, 2011, at 11:15 a.m.  There, Dr. Sherryll [name] should repeat a chest x- ray to evaluate for resolution of his pneumonia.  PROCEDURES PERFORMED:  A 2-D  echo which showed mildly dilated left ventricle with an ejection fraction of 40-45%, grade 2 diastolic dysfunction, and mild aortic stenosis.  CONSULTATIONS:  None.  BRIEF ADMITTING HISTORY AND PHYSICAL:  Eugene [name] is an 77 year old man with a past medical history of hypertension, coronary artery disease status post PTCA, obstructive sleep apnea and hypertension who came to the ED for shortness of breath.  Shortness of breath started 3-4 days prior to presentation and became progressively worse.  He denied any coughing, fever, chills, sweating, runny nose, chest pain, or worsening leg edema.  He had been using CPAP at home and no oxygen.  He denied abdominal pain, diarrhea, melena, dysuria, or leg pain on admission.  He had bilateral leg swelling for several years.  He denies smoking, alcohol, drug abuse, or sick contacts.  In the ED, he was placed on BiPAP which imporoved his shortness of breath.  PHYSICAL EXAM: VITAL SIGNS:  Temperature 97.8, pulse 84, blood pressure 151/87,  respiratory rate 20, O2 sat 94% on BiPAP, then 99% on room  air. GENERAL:  In no acute distress.  Lying flat on his back w/o dyspnea. HEAD: Normocephalic, atraumatic. EYES:  Pupils equal, round, and reactive to light.  Extraocular movement intact. NECK:  Supple.  No JVD or cervical lymphadenopathy. LUNGS:  Clear to auscultation bilaterally.  Mild wheezing and bilateral crackles. CARDIOVASCULAR:  Regular rate and rhythm with 2/6 systolic ejection murmur. ABDOMEN:  Soft, nontender, nondistended.  Normal bowel sounds. EXTREMITIES:  1 to 2+ pitting edema bilaterally, chronic. NEUROLOGICAL:  Alert and oriented x 3.  No focal motor or sensory deficits.  HOSPITAL COURSE BY PROBLEM: 1. Shortness of breath.  Eugene [name] did not have any fever, coughing,     or chest pain on admission.  However, he had mild leukocytosis.     Chest x-ray was obtained which demonstrated bilateral lung infiltrates     which was most  consistent with an atypical community-acquired     pneumonia.  As part of the differential, CHF was also considered     along with airway disease.  He was admitted to telemetry.     He was given IV Avelox.  Blood cultures, sputum cultures, as well     as urine strep and Legionella were obtained.  Urine strep and     Legionella were negative.  Blood cultures were also negative on     discharge.  He was also started on bronchodilators and he     was gentlly diuresed with IV Lasix.  On the second day, he felt      much better.  His pulmonary status was also improved, and required      less nasal cannula oxygen.  By the third and fourth day he felt      back to his baseline and he was discharged home with a total of      8 days of Avelox.  He is to follow up with Dr. Sherryll [name] in the      Outpatient Clinic for re-evaluation and a repeat CXR.  2. Hypertension.  Eugene [name]' blood pressure was slightly elevated.       His carvedilol was increased to 12.5 mg b.i.d.  His blood      pressure at discharge was well controlled.  3. CAD status post PTCA.  Eugene [name] was asymptomatic. His most recent      2-D echo before hospitalization showed an EF of 50-55% with hypokinesis.       Repeat 2-D echo was obtained during the hospitalization which showed      an EF of 40-45%, slightly worse than prior exam.  He is scheduled      to follow up with his outpatient cardiologist for further management.  DISCHARGE LABS AND VITALS:  Sodium 143, potassium 3.6, chloride 108, bicarb 26, glucose 118, BUN 24, creatinine 1.07.  Urine Legionella was negative.  Blood culture preliminary results are negative x 2. Cardiac enzymes were negative.  Hemoglobin A1c 5.4.  White blood cell count 10.5, hemoglobin 13.5, hematocrit 40.3, platelets 243.  Vitals:  Temperature 97.2, pulse 62, respiratory rate 20, blood pressure 125/68,  O2 saturation was above 90% on room air.   ______________________________ Darnelle Maffucci,  MD   ______________________________ Doneen Poisson, MD   PT/MEDQ  D:  11/26/2010  T:  11/26/2010  Job:  [id number]  cc:   [facility] at [facility]  Electronically Signed by Darnelle Maffucci  on 12/08/2010 02:55:47 PM Electronically Signed by Doneen Poisson  on 12/14/2010 09:51:08 AM

## 2010-12-20 LAB — CBC
HCT: 41 % (ref 39.0–52.0)
HCT: 42.5 % (ref 39.0–52.0)
Hemoglobin: 12.6 g/dL — ABNORMAL LOW (ref 13.0–17.0)
Hemoglobin: 13.2 g/dL (ref 13.0–17.0)
MCH: 30.7 pg (ref 26.0–34.0)
MCH: 31.2 pg (ref 26.0–34.0)
MCHC: 33.7 g/dL (ref 30.0–36.0)
MCHC: 34.8 g/dL (ref 30.0–36.0)
MCV: 89.3 fL (ref 78.0–100.0)
MCV: 89.7 fL (ref 78.0–100.0)
Platelets: 184 10*3/uL (ref 150–400)
Platelets: 210 10*3/uL (ref 150–400)
Platelets: 198 10*3/uL (ref 150–400)
RBC: 4.26 MIL/uL (ref 4.22–5.81)
RBC: 4.52 MIL/uL (ref 4.22–5.81)
RDW: 14.3 % (ref 11.5–15.5)
RDW: 14.4 % (ref 11.5–15.5)
RDW: 14.4 % (ref 11.5–15.5)
WBC: 8 10*3/uL (ref 4.0–10.5)
WBC: 8.4 10*3/uL (ref 4.0–10.5)

## 2010-12-20 LAB — DIFFERENTIAL
Basophils Relative: 1 % (ref 0–1)
Eosinophils Absolute: 0.3 10*3/uL (ref 0.0–0.7)
Eosinophils Relative: 4 % (ref 0–5)
Lymphs Abs: 1.6 10*3/uL (ref 0.7–4.0)
Monocytes Absolute: 0.9 10*3/uL (ref 0.1–1.0)
Monocytes Relative: 12 % (ref 3–12)

## 2010-12-20 LAB — COMPREHENSIVE METABOLIC PANEL
Albumin: 3.1 g/dL — ABNORMAL LOW (ref 3.5–5.2)
BUN: 17 mg/dL (ref 6–23)
CO2: 26 mEq/L (ref 19–32)
Calcium: 8.7 mg/dL (ref 8.4–10.5)
Chloride: 109 mEq/L (ref 96–112)
Creatinine, Ser: 0.72 mg/dL (ref 0.4–1.5)
GFR calc non Af Amer: >60 mL/min (ref >60)
Total Bilirubin: 0.8 mg/dL (ref 0.3–1.2)

## 2010-12-20 LAB — LIPID PANEL
Cholesterol: 161 mg/dL (ref 0–200)
HDL: 37 mg/dL — ABNORMAL LOW (ref >39)
LDL Cholesterol: 88 mg/dL (ref 0–99)
Triglycerides: 181 mg/dL — ABNORMAL HIGH (ref <150)

## 2010-12-20 LAB — TYPE AND SCREEN: Antibody Screen: NEGATIVE

## 2010-12-20 LAB — BASIC METABOLIC PANEL
Calcium: 8.8 mg/dL (ref 8.4–10.5)
GFR calc Af Amer: >60 mL/min (ref >60)
GFR calc non Af Amer: >60 mL/min (ref >60)
Sodium: 143 mEq/L (ref 135–145)

## 2010-12-23 LAB — URINE MICROSCOPIC-ADD ON

## 2010-12-23 LAB — URINE CULTURE: Colony Count: 6000

## 2010-12-23 LAB — URINALYSIS, ROUTINE W REFLEX MICROSCOPIC
Bilirubin Urine: NEGATIVE
Glucose, UA: NEGATIVE mg/dL
Hgb urine dipstick: NEGATIVE
Ketones, ur: NEGATIVE mg/dL
Leukocytes, UA: NEGATIVE
Nitrite: NEGATIVE
Protein, ur: 30 mg/dL — AB
Specific Gravity, Urine: 1.018 (ref 1.005–1.030)
Urobilinogen, UA: 1 mg/dL (ref 0.0–1.0)
pH: 6.5 (ref 5.0–8.0)

## 2010-12-28 ENCOUNTER — Ambulatory Visit (INDEPENDENT_AMBULATORY_CARE_PROVIDER_SITE_OTHER): Payer: Medicare Other | Admitting: Internal Medicine

## 2010-12-28 ENCOUNTER — Encounter: Payer: Self-pay | Admitting: Internal Medicine

## 2010-12-28 DIAGNOSIS — I5022 Chronic systolic (congestive) heart failure: Secondary | ICD-10-CM | POA: Insufficient documentation

## 2010-12-28 DIAGNOSIS — I872 Venous insufficiency (chronic) (peripheral): Secondary | ICD-10-CM

## 2010-12-28 DIAGNOSIS — I509 Heart failure, unspecified: Secondary | ICD-10-CM

## 2010-12-28 LAB — BASIC METABOLIC PANEL
BUN: 15 mg/dL (ref 6–23)
CO2: 29 mEq/L (ref 19–32)
Calcium: 9.5 mg/dL (ref 8.4–10.5)
Chloride: 104 mEq/L (ref 96–112)
Creat: 0.86 mg/dL (ref 0.40–1.50)
Potassium: 4.7 mEq/L (ref 3.5–5.3)

## 2010-12-28 MED ORDER — FUROSEMIDE 40 MG PO TABS: 40.0000 mg | ORAL_TABLET | Freq: Every day | ORAL | Status: DC

## 2010-12-28 MED ORDER — SIMVASTATIN 20 MG PO TABS: 20.0000 mg | ORAL_TABLET | Freq: Every day | ORAL | Status: DC

## 2010-12-28 NOTE — Progress Notes (Signed)
  Subjective:    Patient ID: Eugene [name], male    DOB: 1923/12/24, 77 y.o.   MRN: [id number]  HPI  77 yr old man with  Past Medical History  Diagnosis Date  . Glaucoma     Right eye  . Diverticulosis   . Macular degeneration of right eye   . Allergic rhinitis   . CAD (coronary artery disease)     status post percutaneous transluminal coronary angioplasty in 1991.  . OSA on CPAP   . Hypertension   . Hyperlipidemia   . Chronic CHF (congestive heart failure)     ejection fraction of 40-45%, grade 2 diastolic  dysfunction, and mild aortic stenosis   comes to the clinic for hospital follow up of pneumonia and congestive heart failure. Patient reports that shortness of breath is better, although there is some accumulation of fluid in lower extremities. Denies chest pain, cough, hematochezia, melena, palpitations, diaphoresis.   Review of Systems  All other systems reviewed and are negative.       Objective:   Physical Exam  Constitutional: He is oriented to person, place, and time. He appears well-developed and well-nourished. No distress.  HENT:  Mouth/Throat: Oropharynx is clear and moist.  Eyes: Conjunctivae and EOM are normal. Pupils are equal, round, and reactive to light.  Neck: Normal range of motion. Neck supple.  Cardiovascular: Normal rate and regular rhythm.   Murmur heard. Pulmonary/Chest: Effort normal. No respiratory distress. He has no wheezes.       Fine bibasilar crackles  Abdominal: Soft. Bowel sounds are normal.  Musculoskeletal: Normal range of motion.       Chronic venous changes lower extremities +2 edema bilaterally lower extremity   Neurological: He is alert and oriented to person, place, and time.  Psychiatric: He has a normal mood and affect.          Assessment & Plan:

## 2010-12-28 NOTE — Patient Instructions (Signed)
Make a follow up appointment in 1 month. If you develop shortness of breath or increased lower extremity swelling call clinic to make an appointment. Take all medication as directed.

## 2011-01-01 ENCOUNTER — Encounter: Payer: Self-pay | Admitting: Internal Medicine

## 2011-01-01 NOTE — Assessment & Plan Note (Signed)
Stable. Continue current regimen. Patient was instructed to continue checking weight and to double lasix dose if patient gains greater than 3 pounds. Lower extremities edema related more to chronic venous insufficiency, plan as described above. Will check bmet and BNP.

## 2011-01-01 NOTE — Assessment & Plan Note (Signed)
Most likely contributor of lower extremity edema. Patient was given a prescription for compression stockings. Instructed to elevated legs above his heart and to decrease sodium intake. Will follow up.

## 2011-01-08 ENCOUNTER — Encounter: Payer: Self-pay | Admitting: *Deleted

## 2011-01-08 NOTE — Progress Notes (Unsigned)
[facility] PT called Friday and stated pt was taking 1 1/2 tablets of his 40mg  lasix, and was becoming tired and muc mem were drying out, i reviewed his lasix dosage and noted it should only be 40mg  daily, i informed the PT and tried to call pt w/ no success. Pt presented this am stating he was told he had an appt this am, to bring all meds, he did not have an appt that i could locate but we did discuss his lasix and he stated he started taking only 40mg  sat and feels much better, he states his next appt and the time, moves very well and is quite jovial. i have ask him to call if he has any problems again w/ his meds and he states he will do so.

## 2011-01-09 LAB — CROSSMATCH
ABO/RH(D): O POS
Antibody Screen: NEGATIVE

## 2011-01-09 LAB — BASIC METABOLIC PANEL
BUN: 16 mg/dL (ref 6–23)
CO2: 23 mEq/L (ref 19–32)
CO2: 24 mEq/L (ref 19–32)
Chloride: 113 mEq/L — ABNORMAL HIGH (ref 96–112)
Chloride: 114 mEq/L — ABNORMAL HIGH (ref 96–112)
Creatinine, Ser: 0.85 mg/dL (ref 0.4–1.5)
GFR calc Af Amer: >60 mL/min (ref >60)
GFR calc Af Amer: >60 mL/min (ref >60)
GFR calc non Af Amer: >60 mL/min (ref >60)
Glucose, Bld: 106 mg/dL — ABNORMAL HIGH (ref 70–99)
Glucose, Bld: 111 mg/dL — ABNORMAL HIGH (ref 70–99)
Potassium: 3.3 mEq/L — ABNORMAL LOW (ref 3.5–5.1)
Potassium: 3.4 mEq/L — ABNORMAL LOW (ref 3.5–5.1)
Potassium: 3.8 mEq/L (ref 3.5–5.1)
Sodium: 141 mEq/L (ref 135–145)
Sodium: 142 mEq/L (ref 135–145)
Sodium: 143 mEq/L (ref 135–145)

## 2011-01-09 LAB — CBC
HCT: 29.6 % — ABNORMAL LOW (ref 39.0–52.0)
HCT: 30.4 % — ABNORMAL LOW (ref 39.0–52.0)
Hemoglobin: 10.2 g/dL — ABNORMAL LOW (ref 13.0–17.0)
Hemoglobin: 10.2 g/dL — ABNORMAL LOW (ref 13.0–17.0)
Hemoglobin: 10.6 g/dL — ABNORMAL LOW (ref 13.0–17.0)
Hemoglobin: 11.1 g/dL — ABNORMAL LOW (ref 13.0–17.0)
Hemoglobin: 11.6 g/dL — ABNORMAL LOW (ref 13.0–17.0)
Hemoglobin: 13.3 g/dL (ref 13.0–17.0)
MCHC: 33.8 g/dL (ref 30.0–36.0)
MCHC: 34.3 g/dL (ref 30.0–36.0)
MCHC: 34.5 g/dL (ref 30.0–36.0)
MCHC: 34.6 g/dL (ref 30.0–36.0)
MCV: 92.2 fL (ref 78.0–100.0)
MCV: 92.3 fL (ref 78.0–100.0)
MCV: 92.4 fL (ref 78.0–100.0)
Platelets: 167 10*3/uL (ref 150–400)
Platelets: 171 10*3/uL (ref 150–400)
Platelets: 183 10*3/uL (ref 150–400)
RBC: 3.36 MIL/uL — ABNORMAL LOW (ref 4.22–5.81)
RBC: 3.48 MIL/uL — ABNORMAL LOW (ref 4.22–5.81)
RBC: 3.66 MIL/uL — ABNORMAL LOW (ref 4.22–5.81)
RBC: 4.37 MIL/uL (ref 4.22–5.81)
RDW: 13.9 % (ref 11.5–15.5)
RDW: 14 % (ref 11.5–15.5)
RDW: 14.1 % (ref 11.5–15.5)
RDW: 14.1 % (ref 11.5–15.5)
RDW: 14.1 % (ref 11.5–15.5)
WBC: 6.5 10*3/uL (ref 4.0–10.5)
WBC: 9.9 10*3/uL (ref 4.0–10.5)

## 2011-01-09 LAB — ABO/RH
ABO/RH(D): O POS
ABO/RH(D): O POS

## 2011-01-09 LAB — URINALYSIS, ROUTINE W REFLEX MICROSCOPIC
Bilirubin Urine: NEGATIVE
Hgb urine dipstick: NEGATIVE
Ketones, ur: NEGATIVE mg/dL
Protein, ur: NEGATIVE mg/dL
Urobilinogen, UA: 0.2 mg/dL (ref 0.0–1.0)

## 2011-01-09 LAB — CARDIAC PANEL(CRET KIN+CKTOT+MB+TROPI)
CK, MB: 3 ng/mL (ref 0.3–4.0)
Relative Index: INVALID (ref 0.0–2.5)
Relative Index: INVALID (ref 0.0–2.5)
Total CK: 56 U/L (ref 7–232)
Total CK: 59 U/L (ref 7–232)
Total CK: 60 U/L (ref 7–232)
Troponin I: 0.07 ng/mL — ABNORMAL HIGH (ref 0.00–0.06)
Troponin I: 0.11 ng/mL — ABNORMAL HIGH (ref 0.00–0.06)

## 2011-01-09 LAB — COMPREHENSIVE METABOLIC PANEL
ALT: 24 U/L (ref 0–53)
Albumin: 3.2 g/dL — ABNORMAL LOW (ref 3.5–5.2)
Alkaline Phosphatase: 35 U/L — ABNORMAL LOW (ref 39–117)
Glucose, Bld: 104 mg/dL — ABNORMAL HIGH (ref 70–99)
Potassium: 3.5 mEq/L (ref 3.5–5.1)
Sodium: 141 mEq/L (ref 135–145)
Total Protein: 5.5 g/dL — ABNORMAL LOW (ref 6.0–8.3)

## 2011-01-09 LAB — URINE CULTURE

## 2011-01-09 LAB — POCT CARDIAC MARKERS
CKMB, poc: <1 ng/mL — ABNORMAL LOW (ref 1.0–8.0)
Troponin i, poc: <0.05 ng/mL (ref 0.00–0.09)

## 2011-01-09 LAB — DIFFERENTIAL
Basophils Relative: 0 % (ref 0–1)
Eosinophils Absolute: 0.2 10*3/uL (ref 0.0–0.7)
Eosinophils Relative: 2 % (ref 0–5)
Monocytes Absolute: 0.6 10*3/uL (ref 0.1–1.0)
Monocytes Relative: 7 % (ref 3–12)
Neutrophils Relative %: 71 % (ref 43–77)

## 2011-01-09 LAB — HEMOCCULT GUIAC POC 1CARD (OFFICE): Fecal Occult Bld: POSITIVE

## 2011-01-11 LAB — CARDIAC PANEL(CRET KIN+CKTOT+MB+TROPI)
CK, MB: 3.6 ng/mL (ref 0.3–4.0)
CK, MB: 4.8 ng/mL — ABNORMAL HIGH (ref 0.3–4.0)
Relative Index: 2.9 — ABNORMAL HIGH (ref 0.0–2.5)
Relative Index: 3.6 — ABNORMAL HIGH (ref 0.0–2.5)
Relative Index: 4.1 — ABNORMAL HIGH (ref 0.0–2.5)
Total CK: 118 U/L (ref 7–232)
Troponin I: 0.05 ng/mL (ref 0.00–0.06)

## 2011-01-11 LAB — CBC
HCT: 42.8 % (ref 39.0–52.0)
Hemoglobin: 14.9 g/dL (ref 13.0–17.0)
Hemoglobin: 16.5 g/dL (ref 13.0–17.0)
MCHC: 34.8 g/dL (ref 30.0–36.0)
MCHC: 34.7 g/dL (ref 30.0–36.0)
MCV: 91.9 fL (ref 78.0–100.0)
MCV: 91.5 fL (ref 78.0–100.0)
Platelets: 206 10*3/uL (ref 150–400)
RBC: 4.66 MIL/uL (ref 4.22–5.81)
RBC: 5.21 MIL/uL (ref 4.22–5.81)
RDW: 13.5 % (ref 11.5–15.5)
WBC: 6.3 10*3/uL (ref 4.0–10.5)
WBC: 7.4 10*3/uL (ref 4.0–10.5)

## 2011-01-11 LAB — POCT I-STAT, CHEM 8
BUN: 13 mg/dL (ref 6–23)
Creatinine, Ser: 0.8 mg/dL (ref 0.4–1.5)
Glucose, Bld: 103 mg/dL — ABNORMAL HIGH (ref 70–99)
Hemoglobin: 16.7 g/dL (ref 13.0–17.0)
Sodium: 141 mEq/L (ref 135–145)
TCO2: 22 mmol/L (ref 0–100)

## 2011-01-11 LAB — BASIC METABOLIC PANEL
BUN: 13 mg/dL (ref 6–23)
BUN: 14 mg/dL (ref 6–23)
BUN: 19 mg/dL (ref 6–23)
Calcium: 9.1 mg/dL (ref 8.4–10.5)
Chloride: 108 mEq/L (ref 96–112)
Chloride: 108 mEq/L (ref 96–112)
GFR calc non Af Amer: >60 mL/min (ref >60)
GFR calc non Af Amer: >60 mL/min (ref >60)
GFR calc non Af Amer: >60 mL/min (ref >60)
Potassium: 3.3 mEq/L — ABNORMAL LOW (ref 3.5–5.1)
Potassium: 3.7 mEq/L (ref 3.5–5.1)
Potassium: 4.4 mEq/L (ref 3.5–5.1)
Sodium: 138 mEq/L (ref 135–145)
Sodium: 140 mEq/L (ref 135–145)
Sodium: 141 mEq/L (ref 135–145)

## 2011-01-11 LAB — POCT CARDIAC MARKERS
CKMB, poc: <1 ng/mL — ABNORMAL LOW (ref 1.0–8.0)
Myoglobin, poc: 208 ng/mL (ref 12–200)
Troponin i, poc: <0.05 ng/mL (ref 0.00–0.09)

## 2011-01-11 LAB — URINALYSIS, ROUTINE W REFLEX MICROSCOPIC
Bilirubin Urine: NEGATIVE
Glucose, UA: NEGATIVE mg/dL
Hgb urine dipstick: NEGATIVE
Specific Gravity, Urine: 1.01 (ref 1.005–1.030)
pH: 8 (ref 5.0–8.0)

## 2011-01-11 LAB — APTT: aPTT: 26 s (ref 24–37)

## 2011-01-11 LAB — GLUCOSE, CAPILLARY: Glucose-Capillary: 124 mg/dL — ABNORMAL HIGH (ref 70–99)

## 2011-01-11 LAB — PROTIME-INR: Prothrombin Time: 13.4 seconds (ref 11.6–15.2)

## 2011-01-11 LAB — BRAIN NATRIURETIC PEPTIDE: Pro B Natriuretic peptide (BNP): 149 pg/mL — ABNORMAL HIGH (ref 0.0–100.0)

## 2011-01-15 ENCOUNTER — Other Ambulatory Visit: Payer: Self-pay | Admitting: Internal Medicine

## 2011-01-16 ENCOUNTER — Telehealth: Payer: Self-pay | Admitting: *Deleted

## 2011-01-16 NOTE — Telephone Encounter (Signed)
I talked with Dr Sherryll Burger and he would like to see pt in clinic. Appointment set for tomorrow at 8:45 with Dr Sherryll Burger. Pt asked to go to ED if he has any symptoms, dizziness etc.  HHN states he has checked pulse several times and it has been in 50's and 60's.

## 2011-01-16 NOTE — Telephone Encounter (Signed)
Call from [facility] nurse  with [facility], [facility]. He  is with pt and he checked VS, found pulse to be in  mid 40's.   Pt  has gained 4 lb over 4 days so pt  increased lasix to  60 mg for 2 days and lost 1/2 lb. Weight is 211. Pt is asymptomatic. Usually pulse in 60's  BP 110/60 this AM, on  repeat 136/78 Plan was to discharge pt today from home health as he has reached goals of independent living. Last OV here pulse was 52. # [id number] for Eugene [name]  Please advise.

## 2011-01-16 NOTE — Telephone Encounter (Signed)
Appointment scheduled for [facility] 4/11

## 2011-01-17 ENCOUNTER — Encounter: Payer: Self-pay | Admitting: Internal Medicine

## 2011-01-17 ENCOUNTER — Ambulatory Visit (INDEPENDENT_AMBULATORY_CARE_PROVIDER_SITE_OTHER): Payer: Medicare Other | Admitting: Internal Medicine

## 2011-01-17 DIAGNOSIS — I509 Heart failure, unspecified: Secondary | ICD-10-CM

## 2011-01-17 DIAGNOSIS — I498 Other specified cardiac arrhythmias: Secondary | ICD-10-CM

## 2011-01-17 DIAGNOSIS — E785 Hyperlipidemia, unspecified: Secondary | ICD-10-CM

## 2011-01-17 DIAGNOSIS — T50905A Adverse effect of unspecified drugs, medicaments and biological substances, initial encounter: Secondary | ICD-10-CM

## 2011-01-17 DIAGNOSIS — R001 Bradycardia, unspecified: Secondary | ICD-10-CM

## 2011-01-17 DIAGNOSIS — I10 Essential (primary) hypertension: Secondary | ICD-10-CM

## 2011-01-17 MED ORDER — SIMVASTATIN 40 MG PO TABS: 40.0000 mg | ORAL_TABLET | Freq: Every day | ORAL | Status: DC

## 2011-01-17 MED ORDER — CARVEDILOL 6.25 MG PO TABS: 6.2500 mg | ORAL_TABLET | Freq: Two times a day (BID) | ORAL | Status: DC

## 2011-01-17 NOTE — Assessment & Plan Note (Signed)
Patient is history of coronary artery disease. Therefore restarted L. daily also been on sounded to 100. He has had elevated LDL in last 3 measurements. No increases simvastatin dose to 40 mg once a day. Repeat measurement of course sure in 6 weeks after he starts taking the medications. I realized this after patient had left the clinic therefore I left a message to the patient to the same effect.

## 2011-01-17 NOTE — Patient Instructions (Signed)
Change her Lasix dose to 40 mg anyway today's weight.  Your carvedilol dose has been changed. Refill her medication from pharmacy and start taking one tablet twice a day.  Avoid high salt meals.

## 2011-01-17 NOTE — Progress Notes (Signed)
  Subjective:    Patient ID: Eugene [name], male    DOB: 01-30-1924, 77 y.o.   MRN: [id number]  HPI Patient is 77 year old male with past medical history of coronary artery disease, congestive heart failure, essential hypertension, and hyperlipidemia. He is followed by home health after her last discharge with pneumonia from hospital. I received a call from home health nurse yesterday suggesting that his heart rate was in the 40s. His were pressure has been around 110s to 140s systolic. He does not have any symptoms.  Today in the clinic he suggested he has gained 4 pounds of weight after having heavy meals during Easter. He since has started taking 60 mg of Lasix instead of 40 mg Lasix. He is lost 2 pounds now. He is known to complaint. He is already shortness of breath, fever, nausea, vomiting, constipation, visual difficulties, dizziness, chest pain at this time.  Review of Systems  All other systems reviewed and are negative.       Objective:   Physical Exam  Constitutional: He is oriented to person, place, and time. He appears well-developed and well-nourished. No distress.  HENT:  Head: Normocephalic and atraumatic.  Right Ear: External ear normal.  Left Ear: External ear normal.  Nose: Nose normal.  Eyes: Conjunctivae and EOM are normal. Pupils are equal, round, and reactive to light.  Neck: Normal range of motion. Neck supple.  Cardiovascular: Normal rate, regular rhythm, normal heart sounds and intact distal pulses.   Pulmonary/Chest: Effort normal and breath sounds normal. He has no wheezes. He has no rales.  Abdominal: Soft. Bowel sounds are normal. He exhibits no distension. There is no tenderness. There is no rebound and no guarding.  Musculoskeletal: Normal range of motion. He exhibits edema.  Neurological: He is alert and oriented to person, place, and time. He has normal reflexes.  Skin: He is not diaphoretic.  Psychiatric: He has a normal mood and affect. His behavior  is normal. Judgment and thought content normal.          Assessment & Plan:

## 2011-01-17 NOTE — Assessment & Plan Note (Signed)
Change Carbedilol dose from 12.5 to 6.25 bid

## 2011-01-17 NOTE — Assessment & Plan Note (Signed)
Patient reports about 4 pound weight gain from his baseline weight of 207 pounds. This is over the Easter he had several large meals. Patient started taking 60 mg of Lasix pressure of 40 mg of Lasix per day. He is now lost 2 pounds. Advised him to continue taking 60 mg of Lasix for this week. He will discontinue this to 40 mg of Lasix per day when he reaches his base weight. I have also change his carvedilol dose due to bradycardia.

## 2011-01-17 NOTE — Assessment & Plan Note (Signed)
His blood pressure is very well-controlled. Its systolic pressure at home is between 100-140. Even though the blood pressure is high on this visit I think that patient is overmedicated. Since he also has bradycardia I have decreased his carvedilol dose. Continue to monitor his pressure at home and at the clinic.

## 2011-01-18 ENCOUNTER — Observation Stay (HOSPITAL_COMMUNITY)
Admission: EM | Admit: 2011-01-18 | Discharge: 2011-01-20 | DRG: 378 | Disposition: A | Discharge status: Home / Self Care | Payer: Medicare Other | Source: Ambulatory Visit | Attending: Internal Medicine | Admitting: Internal Medicine

## 2011-01-18 ENCOUNTER — Encounter: Payer: Self-pay | Admitting: Internal Medicine

## 2011-01-18 ENCOUNTER — Emergency Department (HOSPITAL_COMMUNITY)
Admission: EM | Admit: 2011-01-18 | Discharge: 2011-01-18 | Disposition: A | Discharge status: Other Acute Inpatient Hospital | Payer: Medicare Other | Source: Home / Self Care | Attending: Emergency Medicine | Admitting: Emergency Medicine

## 2011-01-18 ENCOUNTER — Telehealth: Payer: Self-pay | Admitting: *Deleted

## 2011-01-18 DIAGNOSIS — L02419 Cutaneous abscess of limb, unspecified: Secondary | ICD-10-CM | POA: Insufficient documentation

## 2011-01-18 DIAGNOSIS — Z79899 Other long term (current) drug therapy: Secondary | ICD-10-CM | POA: Insufficient documentation

## 2011-01-18 DIAGNOSIS — G4733 Obstructive sleep apnea (adult) (pediatric): Secondary | ICD-10-CM | POA: Insufficient documentation

## 2011-01-18 DIAGNOSIS — E785 Hyperlipidemia, unspecified: Secondary | ICD-10-CM | POA: Insufficient documentation

## 2011-01-18 DIAGNOSIS — I2510 Atherosclerotic heart disease of native coronary artery without angina pectoris: Secondary | ICD-10-CM | POA: Insufficient documentation

## 2011-01-18 DIAGNOSIS — I5042 Chronic combined systolic (congestive) and diastolic (congestive) heart failure: Secondary | ICD-10-CM | POA: Insufficient documentation

## 2011-01-18 DIAGNOSIS — H409 Unspecified glaucoma: Secondary | ICD-10-CM | POA: Insufficient documentation

## 2011-01-18 DIAGNOSIS — Z9861 Coronary angioplasty status: Secondary | ICD-10-CM | POA: Insufficient documentation

## 2011-01-18 DIAGNOSIS — I509 Heart failure, unspecified: Secondary | ICD-10-CM | POA: Insufficient documentation

## 2011-01-18 DIAGNOSIS — I10 Essential (primary) hypertension: Secondary | ICD-10-CM | POA: Insufficient documentation

## 2011-01-18 DIAGNOSIS — I872 Venous insufficiency (chronic) (peripheral): Secondary | ICD-10-CM | POA: Insufficient documentation

## 2011-01-18 DIAGNOSIS — K5731 Diverticulosis of large intestine without perforation or abscess with bleeding: Principal | ICD-10-CM | POA: Insufficient documentation

## 2011-01-18 LAB — COMPREHENSIVE METABOLIC PANEL
ALT: 12 U/L (ref 0–53)
ALT: 45 U/L (ref 0–53)
AST: 34 U/L (ref 0–37)
Albumin: 3.9 g/dL (ref 3.5–5.2)
Alkaline Phosphatase: 45 U/L (ref 39–117)
BUN: 12 mg/dL (ref 6–23)
Calcium: 8.6 mg/dL (ref 8.4–10.5)
Chloride: 107 mEq/L (ref 96–112)
GFR calc Af Amer: >60 mL/min (ref >60)
GFR calc Af Amer: >60 mL/min (ref >60)
Glucose, Bld: 102 mg/dL — ABNORMAL HIGH (ref 70–99)
Potassium: 3.7 mEq/L (ref 3.5–5.1)
Sodium: 139 mEq/L (ref 135–145)
Sodium: 140 mEq/L (ref 135–145)
Total Bilirubin: 1 mg/dL (ref 0.3–1.2)
Total Protein: 5.8 g/dL — ABNORMAL LOW (ref 6.0–8.3)
Total Protein: 6.5 g/dL (ref 6.0–8.3)

## 2011-01-18 LAB — CBC
HCT: 39.2 % (ref 39.0–52.0)
HCT: 41.3 % (ref 39.0–52.0)
HCT: 44.2 % (ref 39.0–52.0)
HCT: 36.6 % — ABNORMAL LOW (ref 39.0–52.0)
Hemoglobin: 14.6 g/dL (ref 13.0–17.0)
MCH: 28.9 pg (ref 26.0–34.0)
MCHC: 33.2 g/dL (ref 30.0–36.0)
MCHC: 35.4 g/dL (ref 30.0–36.0)
MCV: 90.9 fL (ref 78.0–100.0)
MCV: 86.9 fL (ref 78.0–100.0)
Platelets: 220 10*3/uL (ref 150–400)
RBC: 4.55 MIL/uL (ref 4.22–5.81)
RDW: 13.9 % (ref 11.5–15.5)
RDW: 14.5 % (ref 11.5–15.5)
RDW: 14.8 % (ref 11.5–15.5)
WBC: 9.9 10*3/uL (ref 4.0–10.5)
WBC: 8.2 10*3/uL (ref 4.0–10.5)

## 2011-01-18 LAB — DIFFERENTIAL
Basophils Absolute: 0.1 10*3/uL (ref 0.0–0.1)
Basophils Absolute: 0.1 10*3/uL (ref 0.0–0.1)
Basophils Relative: 1 % (ref 0–1)
Basophils Relative: 1 % (ref 0–1)
Eosinophils Relative: 2 % (ref 0–5)
Eosinophils Relative: 5 % (ref 0–5)
Monocytes Absolute: 0.8 10*3/uL (ref 0.1–1.0)
Monocytes Absolute: 1 10*3/uL (ref 0.1–1.0)
Monocytes Relative: 10 % (ref 3–12)

## 2011-01-18 LAB — URINE CULTURE

## 2011-01-18 LAB — URINALYSIS, ROUTINE W REFLEX MICROSCOPIC
Bilirubin Urine: NEGATIVE
Bilirubin Urine: NEGATIVE
Glucose, UA: NEGATIVE mg/dL
Hgb urine dipstick: NEGATIVE
Ketones, ur: NEGATIVE mg/dL
Nitrite: NEGATIVE
Specific Gravity, Urine: 1.01 (ref 1.005–1.030)
Specific Gravity, Urine: 1.011 (ref 1.005–1.030)
Urobilinogen, UA: 0.2 mg/dL (ref 0.0–1.0)
Urobilinogen, UA: 1 mg/dL (ref 0.0–1.0)
pH: 8 (ref 5.0–8.0)

## 2011-01-18 LAB — CARDIAC PANEL(CRET KIN+CKTOT+MB+TROPI)
CK, MB: 2.8 ng/mL (ref 0.3–4.0)
Relative Index: 2 (ref 0.0–2.5)

## 2011-01-18 LAB — HEMOGLOBIN A1C
Hgb A1c MFr Bld: 5.7 % (ref 4.6–6.1)
Mean Plasma Glucose: 117 mg/dL

## 2011-01-18 LAB — CK TOTAL AND CKMB (NOT AT ARMC)
Relative Index: INVALID (ref 0.0–2.5)
Total CK: 90 U/L (ref 7–232)

## 2011-01-18 LAB — GLUCOSE, CAPILLARY
Glucose-Capillary: 108 mg/dL — ABNORMAL HIGH (ref 70–99)
Glucose-Capillary: 95 mg/dL (ref 70–99)

## 2011-01-18 LAB — TYPE AND SCREEN
ABO/RH(D): O POS
Antibody Screen: NEGATIVE

## 2011-01-18 LAB — POCT CARDIAC MARKERS: Myoglobin, poc: 121 ng/mL (ref 12–200)

## 2011-01-18 LAB — LIPID PANEL
HDL: 25 mg/dL — ABNORMAL LOW (ref >39)
LDL Cholesterol: 52 mg/dL (ref 0–99)
Total CHOL/HDL Ratio: 4.5 RATIO
Triglycerides: 174 mg/dL — ABNORMAL HIGH (ref <150)
VLDL: 35 mg/dL (ref 0–40)

## 2011-01-18 LAB — BRAIN NATRIURETIC PEPTIDE: Pro B Natriuretic peptide (BNP): 202 pg/mL — ABNORMAL HIGH (ref 0.0–100.0)

## 2011-01-18 NOTE — H&P (Signed)
Hospital Admission Note Date: 01/18/2011  Patient name: Eugene [name] Medical record number: [id number] Date of birth: 05-28-1924 Age: 77 y.o. Gender: male PCP: Clerance Lav, MD  Medical Service:  Attending physician: Dr. Aundria Rud     Resident (R2/R3):  Dr. Denton Meek  Pager: [phone] Resident (R1):  Dr. Odis Luster  Pager: [phone]  Chief Complaint: Rectal Bleeding  History of Present Illness: Pt is a 77 year old man with PMH of HTN, CAD s/p stenting, OSA, and diverticulosis  who came to [facility] ED for complaints of BRPR, that first began 1 day prior to admission. The patient has had 3 bloody stools at [facility] and 2 stools the night prior (Total of 5 episodes). He denies any abdominal pain, nausea, vomiting, fever, CP, SOB or any other complaints. Patient endorses some constipation and hard stool 2-3 days prior to event. The patient has known diverticular disease and is followed by Dr. Madilyn Fireman of [facility]. His last hospitalization for this complaint was October 2011 and before that in December 2010. During both of these admissions, patient was evaluated by GI, who recommended patient have Hgb monitored before pursuing any other studies. Both vital signs and Hgb remained stable, and the patient did not require blood transfusions, nor any other invasive studies. Last colonoscopy done 2006, and plan per GI is not to repeat colonoscopy unless bleeding is persistent and life threatening. Furthermore, want to avoid colonoscopy given patient's age and chronic co-morbidities.   Current Outpatient Prescriptions  Medication Sig Dispense Refill  . amLODipine (NORVASC) 10 MG tablet Take 10 mg by mouth daily.        Marland Kitchen aspirin 81 MG tablet Take 81 mg by mouth daily.        . carvedilol (COREG) 6.25 MG tablet Take 1 tablet (6.25 mg total) by mouth 2 (two) times daily with a meal.  30 tablet  3  . clotrimazole (LOTRIMIN) 1 % cream Apply topically 2 (two) times daily.        Marland Kitchen docusate sodium (COLACE) 100 MG capsule Take 100 mg  by mouth 2 (two) times daily.        . furosemide (LASIX) 40 MG tablet Take 1 tablet (40 mg total) by mouth daily.  30 tablet  2  . losartan (COZAAR) 50 MG tablet Take 50 mg by mouth daily.        Marland Kitchen loteprednol (LOTEMAX) 0.5 % ophthalmic suspension Place 1 drop into the right eye 4 (four) times daily.        . simvastatin (ZOCOR) 40 MG tablet Take 1 tablet (40 mg total) by mouth at bedtime.  30 tablet  3  . travoprost, benzalkonium, (TRAVATAN) 0.004 % ophthalmic solution Place 1 drop into the right eye at bedtime.          Allergies: Penicillins  Past Medical History  Diagnosis Date  . Glaucoma     Right eye  . Diverticulosis - last colonoscopy done 2006, pt followed by Dr. Madilyn Fireman, and no further colonoscopies will be done given age, co-morbidities, and will only be reserved for life threatening bleeding    . Macular degeneration of right eye   . Allergic rhinitis   . CAD (coronary artery disease)     status post percutaneous transluminal coronary angioplasty in 1991.  . OSA on CPAP   . Hypertension   . Hyperlipidemia   . Chronic CHF (congestive heart failure) Last Echo Feb. 2012    ejection fraction of 40-45%, grade 2 diastolic  dysfunction, and mild aortic stenosis  Past Surgical History  Procedure Date  . Implantation of baerveldt glaucoma implant with scleral reinforcement using tutoplast tissue graft right eye.   . Coronary angioplasty with stent placement 1991    Family History  Both parents deceased, no hx of CAD  History   Social History  . Marital Status: Widowed  . Years of Education: Completed high school   Occupational History  . Retired, worked in the [employer] for many years, also had his own business for many years   Social History Main Topics  . Smoking status: Former Smoker    Types: Cigarettes  . Smokeless tobacco: Not on file  . Alcohol Use: Not on file  . Drug Use: No  . Sexually Active: Occasional     Social History Narrative  . Lives at  home by himself, and has Home Health RN come to assist with medications Has one daughter, relationship with daughter is not the best     Review of Systems: Pertinent items are noted in HPI.  Physical Exam:   BP: 173/84 T:97.7  HR:67  R:20  O2 Sats: 97% RA 147/86 (now) given 1 L bolus NS  General appearance: alert, cooperative and no distress Throat: lips, mucosa, and tongue normal; teeth and gums normal Lungs: clear to auscultation bilaterally Heart: regular rate and rhythm, S1, S2 normal, no murmur, click, rub or gallop Abdomen: soft, non-tender; bowel sounds normal; no masses,  no organomegaly Rectal: gross bright red blood visible Neurologic: Grossly normal alert and oriented x 3  Lab results:  Comprehensive Metabolic Panel:    Component Value Date/Time   NA 140 01/18/2011 0815   K 3.9 01/18/2011 0815   CL 107 01/18/2011 0815   CO2 25 01/18/2011 0815   BUN 15 01/18/2011 0815   CREATININE 0.63 01/18/2011 0815   CREATININE 0.86 12/28/2010 1557   GLUCOSE 102* 01/18/2011 0815   CALCIUM 8.6 01/18/2011 0815   AST 15 01/18/2011 0815   ALT 12 01/18/2011 0815   ALKPHOS 59 01/18/2011 0815   BILITOT 0.6 01/18/2011 0815   PROT 5.8* 01/18/2011 0815   ALBUMIN 3.3* 01/18/2011 0815    CBC:    Component Value Date/Time   WBC 7.1 01/18/2011 0815   HGB 13.0 01/18/2011 0815   HCT 39.2 01/18/2011 0815   PLT 165 01/18/2011 0815   MCV 87.1 01/18/2011 0815   NEUTROABS 4.4 01/18/2011 0815   LYMPHSABS 1.4 01/18/2011 0815   MONOABS 0.8 01/18/2011 0815   EOSABS 0.3 01/18/2011 0815   BASOSABS 0.1 01/18/2011 0815    Baseline Hgb between 13-14 Last Hgb on discharge 11/29/2010 was 13.4  FOBT - positive   Assessment & Plan by Problem:  1.) GI Bleeding: Differential diagnosis includes Diverticulosis, hemorrhoids or AVM's. Other less likely causes includes malignancy, colitis and rapid upper GI bleeding. Given prior hx of diverticular disease, and evidence of diverticulosis on colonoscopy 2006, the leading  diagnosis at this time is diverticulosis.   Plan: -Will admit to telemetry bed and check orthostatic vitals -Type & screen -2 large bores iv -Will start protonix -Will check CBC every 12 hours -Fluid resucitation -Clear liquid diet  Would consider GI consult if patient's Hgb drops. As mentioned earlier, do not want to pursue any further imaging/invasive studies given his age, co-morbidities, and relatively quick resolution in symptoms.   2.) Systolic and Diastolic Heart Failure - with EF 45-50% (2D Echo 11/2010). Currently stable, no signs of exacerbation. Given rectal bleeding, will hold Lasix, Losartan, and Norvasc.  3.) CAD - s/p stenting 1991. Will hold ASA in setting of bleeding and continue Coreg and Simvastatin. Currently stable, without chest pain.   4.) HTN - hold BP meds in setting of #1. Continue Coreg only.  5.) HLD - Continue home statin. Last lipid panel done 09/2010 which showed total cholesterol of 196, triglycerides of 181, HDL 45 and LDL 115.   6.) Glaucoma - stable, will continue home eye drops.   7.) Sleep Apnea - will continue CPAP at home settings per RT.   8.) VTE Prophylaxis: SCDs   Sakoya Win, PGY-2 [phone]  _________________     ATTENDING: I performed and/or observed a history and physical examination of the patient.  I discussed the case with the residents as noted and reviewed the residents' notes.  I agree with the findings and plan--please refer to the attending physician note for more details.  Signature________________________________  Printed Name_____________________________

## 2011-01-18 NOTE — Telephone Encounter (Signed)
Call from [place], [place] with [facility] stating they did not see pt in home yesterday. Pt is at [facility] ED at the time with c/o rectal bleeding.  She wanted to let you know.

## 2011-01-18 NOTE — H&P (Signed)
  NICU Admission Maternal Medical History Assessment & Plan

## 2011-01-18 NOTE — H&P (Signed)
Hospital Admission Note Date: 01/18/2011  Patient name: Eugene [name] Medical record number: [id number] Date of birth: [birth year] Age: 77 y.o. Gender: male PCP: Clerance Lav, MD  Medical Service:  Attending physician: Dr. Aundria Rud     Resident (R2/R3):  Dr. Denton Meek  Pager: [phone] Resident (R1):  Dr. Odis Luster  Pager: [phone]  Chief Complaint: Rectal Bleeding  History of Present Illness: Pt is a 77 year old man with PMH of HTN, CAD s/p stenting, OSA, and diverticulosis  who came to [facility] ED for complaints of BRPR, that first began 1 day prior to admission. The patient has had 3 bloody stools at [facility] and 2 stools the night prior (Total of 5 episodes). He denies any abdominal pain, nausea, vomiting, fever, CP, SOB or any other complaints. Patient denied recent constipation and stated that he'd been having 1-2 regular, non-bloody bowel movements daily prior to this event. The patient has known diverticular disease and is followed by Dr. Madilyn Fireman of  GI. His last hospitalization for this complaint was October 2011 and before that in December 2010. During both of these admissions, patient was evaluated by GI, who recommended patient have Hgb monitored before pursuing any other studies. Both vital signs and Hgb remained stable, and the patient did not require blood transfusions, nor any other invasive studies. Last colonoscopy done 2006, and plan per GI is not to repeat colonoscopy unless bleeding is persistent and life threatening. Furthermore, want to avoid colonoscopy given patient's age and chronic co-morbidities.   Medications: 1. Carvedilol 12.5 mg tablets by mouth twice daily. 2. Simvastatin 20 mg p.o. daily at nighttime. 3. Advil 200 mg tablets by mouth daily at bedtime. 4. Amlodipine 10 mg tablets 1 tablet by mouth every morning. 5. Aspirin 81 mg tablets 1 tablet by mouth every evening. 6. Clotrimazole cream one application topically twice a day as needed. 7. Docusate 100 mg capsules twice  daily. 8. Losartan 50 mg tablets by mouth every evening. 9.Lotemax 0.5% 1 drop in the right eye every morning. 10.Travatan ophthalmic 0.004% 1 drop in the right eye daily at     bedtime.  Allergies: Penicillins  Past Medical History  Diagnosis Date  . Glaucoma     Right eye  . Diverticulosis - last colonoscopy done 2006, pt followed by Dr. Madilyn Fireman, and no further colonoscopies will be done given age, co-morbidities, and will only be reserved for life threatening bleeding    . Macular degeneration of right eye   . Allergic rhinitis   . CAD (coronary artery disease)     status post percutaneous transluminal coronary angioplasty in 1991.  . OSA on CPAP   . Hypertension   . Hyperlipidemia   . Chronic CHF (congestive heart failure) Last Echo Feb. 2012    ejection fraction of 40-45%, grade 2 diastolic  dysfunction, and mild aortic stenosis    Past Surgical History  Procedure Date  . Implantation of baerveldt glaucoma implant with scleral reinforcement using tutoplast tissue graft right eye.   . Coronary angioplasty with stent placement 1991    Family History: Both parents deceased, no hx of CAD  History   Social History  . Marital Status: Widowed  . Years of Education: Completed high school   Occupational History  . Retired, worked in the [employer] for many years, also had his own business for many years   Social History Main Topics  . Smoking status: Former Smoker    Types: Cigarettes  . Smokeless tobacco: Not on file  . Alcohol Use:  Not on file  . Drug Use: No  . Sexually Active: Occasional     Social History Narrative  . Lives at home by himself, and has Home Health RN come to assist with medications Has one daughter, relationship with daughter is not the best     Review of Systems: Pertinent items are noted in HPI.  Physical Exam: BP: 173/84 T:97.7  HR:67  R:20  O2 Sats: 97% RA 147/86 (now) given 1 L bolus NS General appearance: alert, cooperative and no  distress Throat: lips, mucosa, and tongue normal; teeth and gums normal Lungs: clear to auscultation bilaterally Heart: regular rate and rhythm, S1, S2 normal, no murmur, click, rub or gallop Abdomen: soft, non-tender; bowel sounds normal; no masses,  no organomegaly, obese Rectal: gross bright red blood visible Extremities: 2+ pitting edema bilaterally with moderate erythema, scale and venous stasis changes, small 1cm denuded area on anterior LLE Pulses: 2+ and symmetric Neurologic: Grossly normal alert and oriented x 3  Lab results:  Comprehensive Metabolic Panel:    Component Value Date/Time   NA 140 01/18/2011 0815   K 3.9 01/18/2011 0815   CL 107 01/18/2011 0815   CO2 25 01/18/2011 0815   BUN 15 01/18/2011 0815   CREATININE 0.63 01/18/2011 0815   CREATININE 0.86 12/28/2010 1557   GLUCOSE 102* 01/18/2011 0815   CALCIUM 8.6 01/18/2011 0815   AST 15 01/18/2011 0815   ALT 12 01/18/2011 0815   ALKPHOS 59 01/18/2011 0815   BILITOT 0.6 01/18/2011 0815   PROT 5.8* 01/18/2011 0815   ALBUMIN 3.3* 01/18/2011 0815    CBC:    Component Value Date/Time   WBC 7.1 01/18/2011 0815   HGB 13.0 01/18/2011 0815   HCT 39.2 01/18/2011 0815   PLT 165 01/18/2011 0815   MCV 87.1 01/18/2011 0815   NEUTROABS 4.4 01/18/2011 0815   LYMPHSABS 1.4 01/18/2011 0815   MONOABS 0.8 01/18/2011 0815   EOSABS 0.3 01/18/2011 0815   BASOSABS 0.1 01/18/2011 0815    Baseline Hgb between 13-14 Last Hgb on discharge 11/29/2010 was 13.4  FOBT - positive   Assessment & Plan by Problem:  1.) GI Bleeding: Differential diagnosis includes Diverticulosis, hemorrhoids or AVM's. Other less likely causes includes malignancy, colitis and rapid upper GI bleeding. Given prior hx of diverticular disease, and evidence of diverticulosis on colonoscopy 2006, the leading diagnosis at this time is diverticulosis.   Plan: -Will admit to telemetry bed and check orthostatic vitals -Type & screen -2 large bores iv -Will start protonix -Will  check CBC every 12 hours -Fluid resucitation -Clear liquid diet  Would consider GI consult if patient's Hgb drops. As mentioned earlier, do not want to pursue any further imaging/invasive studies given his age, co-morbidities, and relatively quick resolution in symptoms.   2.) Systolic and Diastolic Heart Failure - with EF 45-50% (2D Echo 11/2010). Currently stable, no signs of exacerbation. Given rectal bleeding, will hold Lasix, Losartan, and Norvasc.   3.) CAD - s/p stenting 1991. Will hold ASA in setting of bleeding and continue Coreg and Simvastatin. Currently stable, without chest pain.   4.) HTN - hold BP meds in setting of #1. Continue Coreg only.  5.) HLD - Continue home statin. Last lipid panel done 09/2010 which showed total cholesterol of 196, triglycerides of 181, HDL 45 and LDL 115.   6.) Glaucoma - stable, will continue home eye drops.   7.) Sleep Apnea - will continue CPAP at home settings per RT.   8.)  VTE Prophylaxis: TED hose   Whitney Post, PGY-1 [phone]  _________________   Deatra Robinson, PGY-2 [phone]  _________________    ATTENDING: I performed and/or observed a history and physical examination of the patient.  I discussed the case with the residents as noted and reviewed the residents' notes.  I agree with the findings and plan--please refer to the attending physician note for more details.  Signature________________________________  Printed Name_____________________________

## 2011-01-19 ENCOUNTER — Encounter: Payer: Self-pay | Admitting: Internal Medicine

## 2011-01-19 DIAGNOSIS — K922 Gastrointestinal hemorrhage, unspecified: Secondary | ICD-10-CM

## 2011-01-19 LAB — CBC
HCT: 33.4 % — ABNORMAL LOW (ref 39.0–52.0)
MCH: 29.4 pg (ref 26.0–34.0)
MCH: 28.9 pg (ref 26.0–34.0)
MCHC: 33.2 g/dL (ref 30.0–36.0)
MCHC: 33 g/dL (ref 30.0–36.0)
MCV: 88.4 fL (ref 78.0–100.0)
Platelets: 153 10*3/uL (ref 150–400)
RDW: 15 % (ref 11.5–15.5)
RDW: 14.9 % (ref 11.5–15.5)

## 2011-01-19 LAB — BASIC METABOLIC PANEL
BUN: 11 mg/dL (ref 6–23)
CO2: 24 mEq/L (ref 19–32)
Chloride: 107 mEq/L (ref 96–112)
GFR calc non Af Amer: >60 mL/min (ref >60)
Glucose, Bld: 93 mg/dL (ref 70–99)
Potassium: 3.5 mEq/L (ref 3.5–5.1)

## 2011-01-20 LAB — CBC
MCHC: 32.6 g/dL (ref 30.0–36.0)
Platelets: 168 10*3/uL (ref 150–400)
RDW: 14.9 % (ref 11.5–15.5)
WBC: 7.5 10*3/uL (ref 4.0–10.5)

## 2011-01-22 ENCOUNTER — Encounter: Payer: Self-pay | Admitting: Internal Medicine

## 2011-01-22 ENCOUNTER — Ambulatory Visit (INDEPENDENT_AMBULATORY_CARE_PROVIDER_SITE_OTHER): Payer: Medicare Other | Admitting: Internal Medicine

## 2011-01-22 DIAGNOSIS — K921 Melena: Secondary | ICD-10-CM | POA: Insufficient documentation

## 2011-01-22 DIAGNOSIS — R001 Bradycardia, unspecified: Secondary | ICD-10-CM

## 2011-01-22 DIAGNOSIS — I498 Other specified cardiac arrhythmias: Secondary | ICD-10-CM

## 2011-01-22 DIAGNOSIS — I10 Essential (primary) hypertension: Secondary | ICD-10-CM

## 2011-01-22 NOTE — Progress Notes (Signed)
  Subjective:    Patient ID: Eugene [name], male    DOB: 1924-08-28, 77 y.o.   MRN: [id number]  HPI Patient is 77 year old male with past medical history of hypertension, bradycardia, and recent admission for rectal bleeding to the hospital. Patient was seen in the clinic few days ago for congestive heart failure. At that time we adjusted the dose of Lasix for him. He developed rectal bleeding on the subsequent day. He reports he had several bloody bowel movements with a few hours. He went to the [facility] long hospital and was later on transferred to her care. In the hospital his hemoglobin remained stable and he is diarrhea and bloody bowel moments resolved. He was discharged home in stable condition.  Patient reports that he did not did not have any more bloody bowel movements. He did not have any more dizziness. Is able to tolerate food well. He has continued Lasix as prescribed. His heart rate and blood pressure are within normal range. The home health nurse has stopped visiting him. Patient feels that he is not in need of such services anymore. Patient resides with his grandson and has good social support.   Review of Systems  All other systems reviewed and are negative.   Positive for bilateral leg edema.    Objective:   Physical Exam Constitutional: He is oriented to person, place, and time. He appears well-developed and well-nourished. No distress.  HENT:  Head: Normocephalic and atraumatic.  Right Ear: External ear normal.  Left Ear: External ear normal.  Nose: Nose normal.  Eyes: Conjunctivae and EOM are normal. Pupils are equal, round, and reactive to light.  Neck: Normal range of motion. Neck supple.  Cardiovascular: Normal rate, regular rhythm, normal heart sounds and intact distal pulses.   Pulmonary/Chest: Effort normal and breath sounds normal. He has no wheezes. He has no rales.  Abdominal: Soft. Bowel sounds are normal. He exhibits no distension. There is no tenderness.  There is no rebound and no guarding.  Musculoskeletal: Normal range of motion. He exhibits edema.  Neurological: He is alert and oriented to person, place, and time. He has normal reflexes.  Skin: He is not diaphoretic.  Psychiatric: He has a normal mood and affect. His behavior is normal. Judgment and thought content normal.        Assessment & Plan:

## 2011-01-22 NOTE — Assessment & Plan Note (Signed)
His heart rate is stable between 50s and 60s. He denies any dizziness or chest pain. Continue the present dose of carvedilol.

## 2011-01-22 NOTE — Assessment & Plan Note (Signed)
Patient rectal bleeding has stopped. Patient does not have any abdominal pain nausea or vomiting. Patient has been evaluated by Dr. Madilyn Fireman in the past. He was advised that he should not undergo any colonoscopy unless it is emergently needed. This is mainly because of his current condition. They also don't want to do any screening colonoscopies. At this time patient and I agree and will continue to monitor his rectal bleeding. If it were to recur or become severe we'll consult Dr. Madilyn Fireman. Patient will follow up in one month. At that time a repeat CBC. His hemoglobin has dropped 2 g in total but still about 10. He does not have any ongoing chest pain. He is not in need of transfusion at this time.

## 2011-01-22 NOTE — Patient Instructions (Signed)
Call us if you experience further rectal bleeding. Call us if your pulse is <40 and BP is low. Call us if your weight gain is >5 lbs in one week. Go back to your basic dose of lasix.

## 2011-01-22 NOTE — Assessment & Plan Note (Signed)
His blood pressure is slightly elevated today. It is a delicate balance between bradycardia, dizziness, and proper blood pressure control. Even his issue last 2 years of various symptoms with tight blood pressure control be willing to tolerate his systolic pressure of 160. No changes in medications were made.

## 2011-01-22 NOTE — Progress Notes (Signed)
Pt seen by Dr. Reche Dixon after orthostatic v/s taken and results seen by dr.  Dr. Reche Dixon advised pt to cut Lasix in 1/2 (take 20 mg) for Tues, Wed, Thurs this week and to go back to full pill (40 mg) on Friday if he felt better. He also advised pt he could skip tomorrow's dose of Lasix altogether if pt did not feel better when he got up Tues AM, and then to take 1/2 tab ( 20 mg) Wed and Thurs. Pt instructed to call Thursday if he still felt no better; correct number to call to get nurse by phone reviewed with pt who stated he could not reach correct number when trying to call nurse this afternoon. Pt verbalized understanding these instructions and Nurse Myriam Jacobson gave pt written instructions about Lasix dose for the next 3 days, which he also reviewed and stated he understood. Dorie Rank, RN, 01/22/2011, 5:35 P

## 2011-01-24 ENCOUNTER — Telehealth: Payer: Self-pay | Admitting: *Deleted

## 2011-01-24 NOTE — Telephone Encounter (Signed)
Let him know he can skip it again today and take half dose tomorrow.

## 2011-01-24 NOTE — Telephone Encounter (Signed)
Pt informed and will let us know how he is doing

## 2011-01-24 NOTE — Telephone Encounter (Signed)
Pt called to say he took lasix  20 mg today.  Yesterday he did not take any lasix as instructed. Weight today is 206.9  Yesterday wt was  207.2  0630 today took lasix and  at 0930 experienced nausea and dizziness.  BP 147/68   Pulse 65. Feels good now @ 1130 He is concerned about taking lasix.  Should he continue taking lasix? Denies any SOB or swelling in legs. Pt # [id number]

## 2011-01-31 NOTE — Discharge Summary (Signed)
Eugene [name], Eugene [name]                ACCOUNT NO.:  [id number]  MEDICAL RECORD NO.:  [id number]          PATIENT TYPE:  LOCATION:                                 FACILITY:  PHYSICIAN:  C. Ulyess Mort, M.D.     DATE OF BIRTH:  DATE OF ADMISSION:  01/18/2011 DATE OF DISCHARGE:  01/20/2011                              DISCHARGE SUMMARY   DISCHARGE DIAGNOSES: 1. Lower GI bleed secondary to diverticulosis. 2. Systolic and diastolic congestive heart failure. 3. Coronary artery disease status post stent in 1991. 4. Hypertension. 5. Hyperlipidemia. 6. Glaucoma. 7. Obstructive sleep apnea with the CPAP use. 8. Chronic venous stasis dermatitis.  DISCHARGE MEDICATIONS: 1. Carvedilol 6.25 mg p.o. b.i.d. 2. Lotemax ophthalmic drops 1 drop both eyes q.i.d. 3. Protonix 40 mg 1 p.o. daily. 4. Lipitor 10 mg p.o. at bedtime. 5. Travoprost ophthalmic drops 1 drop each eye at bedtime. 6. Tylenol 650 mg p.o. q.6 hours p.r.n. for pain.  DISPOSITION AND FOLLOWUP:  Eugene [name] was discharged from [facility] on January 20, 2011, in stable and improved condition.  He has not had any bright red blood per rectum for 24 hours.  Orthostatic hypotension with significant decrease in hemoglobin or symptoms of volume depletion.  All of his chronic medical conditions have been stable throughout this admission.  Eugene [name] will plan to see Dr. Sherryll Burger, his primary care physician, at [facility] on January 22, 2011 at 10:45 a.m.  At this time, it will be important to evaluate the patient's vital signs, BMET, and CBC to assess for changes in volume status and blood counts.  It will also be important to assess any symptoms of GI bleed including further bright red blood per rectum, dizziness or signs of volume depletion.  It is also important to follow up regarding potential side effects of new statin medication Lipitor as the patient's previous simvastatin was discontinued in light of  its negative interaction with amlodipine.  PROCEDURES PERFORMED DURING THE HOSPITAL STAY:  None.  CONSULTATIONS:  None.  HISTORY OF PRESENT ILLNESS:  Eugene [name] is an 77 year old man with a past medical history of hypertension, coronary artery disease, obstructive sleep apnea, and diverticulosis, who came to [facility] for complaints of bright red blood per rectum that first began 1 day prior to admission.  The patient has had 3 bloody stools at [facility] and 2 stools the night prior, the total of 5 episodes.  He denies any abdominal pain, nausea, vomiting, fevers, chest pain, shortness of breath, dizziness, lightheadedness, or any other complaints.  The patient denied recent constipation and stated that he has been having 1-2 regular nonbloody bowel movements daily prior to this event.  The patient has known diverticular disease and is followed by Dr. Madilyn Fireman of [facility].  His last hospitalization for this complaint was in October 2011, and before that in December 2010.  During both of these admissions, the patient was evaluated by [facility] who recommended the patient to have hemoglobin monitored before pursuing any other studies.  Both vital signs and hemoglobin remained stable and the  patient did not require blood transfusions.  No any other invasive studies.  Last colonoscopy was done in 2006 and plan per GI is not to repeat colonoscopy unless bleeding is persistent and life threatening.  Furthermore want to avoid colonoscopy given the patient's age and chronic comorbidities.  HOME MEDICATIONS: 1. Carvedilol 12.5 mg p.o. b.i.d. 2. Simvastatin 20 mg p.o. at bedtime. 3. Advil 200 mg 1 p.o. at bedtime. 4. Amlodipine 10 mg 1 p.o. q.a.m. 5. Aspirin 81 mg p.o. daily. 6. Clotrimazole cream 1 application topically twice daily as needed. 7. Docusate 100-mg capsules 1 p.o. b.i.d. 8. Losartan 50 mg 1 p.o. at bedtime. 9. Lotemax 0.5% 1 drop  in the right eye every morning. 10.Travatan ophthalmic 0.004% 1 drop in the right eye daily at     bedtime.  ALLERGIES:  PENICILLIN.  PAST MEDICAL HISTORY: 1. Glaucoma of the right eye. 2. Diverticulosis with the last colonoscopy done in 2006.  The patient     is being followed by Dr. Madilyn Fireman and no further colonoscopies will be     done given the patient's age, comorbidities, and will only be     reserved to life threatening bleeding. 3. History of right eye macular degeneration. 4. Allergic rhinitis. 5. Coronary artery disease status post percutaneous transluminal     coronary angioplasty in 1991. 6. Obstructive sleep apnea with CPAP use. 7. Hypertension. 8. Hyperlipidemia. 9. Chronic CHF with ejection fraction of 40-45% and grade 2 diastolic     dysfunction and mild aortic stenosis per last echocardiogram in     February 2012.  PAST SURGICAL HISTORY: 1. Implantation of glaucoma implant with scleral reinforcement using     2+ tissue graft, right eye. 2. Coronary angioplasty with stent placement in 1991.  PHYSICAL EXAMINATION:  VITAL SIGNS:  Blood pressure 173/84, temperature of 97.7, heart rate of 67, respiratory rate 20, saturating 97% on room air. GENERAL APPEARANCE:  Alert, cooperative, and in no distress. HEENT:  Throat, moist buccal mucosa.  No lesions.  Uvula midline. NECK:  Supple.  No thyromegaly bilaterally.  No JVDs bilaterally. LUNGS:  Clear to auscultation bilaterally. CARDIOVASCULAR:  Regular rate and rhythm.  No murmurs, clicks, or rubs. ABDOMEN:  Nondistended.  Bowel sounds positive, nontender to palpation. No guarding, no rebound. RECTAL:  Gross bright red blood visible. EXTREMITIES:  A 2+/4 pitting edema bilaterally with moderate erythema. Scaling and venous stasis changes to anterior shin.  There is a small 1 cm area on anterior left lower extremity shin without any signs of infection.  Pulses 2+/4 bilaterally. NEUROLOGIC:  Nonfocal. PSYCHIATRIC:  Memory  seemed to be intact to recent and remote, non- anxious and nondepressed appearing.  LABORATORY DATA:  Sodium of 140, potassium 3.9, chloride 107, bicarb 25, BUN 15, creatinine 0.63, glucose of 102, calcium of 8.6.  AST of 15, ALT of 12, alk phos of 59, bilirubin of 0.6, protein of 5.8, albumin of 3.3. White blood count of 7.1, hemoglobin of 13.0, MCV of 87, platelet of 165.  FOBT positive.  HOSPITAL COURSE BY PROBLEM: 1. Lower GI bleed, most likely secondary to diverticulosis.  However,     other differential diagnosis were considered such as hemorrhoids,     AVM, ischemic colitis, polyps, IBS, malignancy, and proctitis.     Given the patient's previous history of similar GI bleed secondary     to diverticulosis, the patient's symptomatic clinical status,     absence of concerning findings other than diverticulosis on     colonoscopy  per 2006, and cessation of bleeding for 1 day,     diverticulosis is most likely cause of this patient's admission.     The patient's hemoglobin and vital signs remained stable throughout     this admission and he did not require any blood transfusions.  As     per GI recommendations on previous admissions, colonoscopy or other     workups were not pursued as the patient did not have life-     threatening bleeding.  The patient will follow up with his PCP on     January 22, 2011, when assured  resolution of his symptoms. 2. Systolic and diastolic heart failure with ejection fraction of 45-     50% as of February 2012.  The patient was stable without any     symptoms of CHF exacerbation.  The patient's home Lasix, losartan,     and amlodipine were held secondary to the GI bleeding during this     admission though his Coreg was continued at a reduced dose of 6.5     mg b.i.d.  Given the patient's stability and improved hemodynamic     status, he may resume all of his home antihypertensive medications     on discharge as above and have close monitoring of blood  pressure     at his visit with Dr. Sherryll Burger. 3. Coronary artery disease status post stenting in 1991.  The patient     was stable throughout admission without any chest pain.  Aspirin     was held in the setting of the lower GI bleed.  Coreg 6.25 mg p.o.     b.i.d. was given during this hospitalization.  The patient's statin     therapy is changed from simvastatin to Lipitor in light of the     interaction between amlodipine and simvastatin.  If the patient is     unable to pay for this medication, he may call Dr. Sherryll Burger and request     Lipitor to be changed to pravastatin. 4. Hyperlipidemia.  The patient's regimen was changed from simvastatin     to Lipitor due to possible interaction between amlodipine and     simvastatin. 5. Glaucoma, stable, continued with home medications of travoprost and     Lotemax. 6. Obstructive sleep apnea, stable.  The patient used CPAP during this     admission. 7. Lower extremity venous stasis.  Stable without any evidence of     infection.  VITAL SIGNS AT THE TIME OF DISCHARGE:  Temperature of 98.1, pulse rate of 57, respiratory rate of 20, blood pressure 132/73, saturating 97% on room air.  LABORATORY DATA:  White blood cell count of 7.5, hemoglobin of 10.9, hematocrit 33.4, MCV of 88.4, platelet count of 160.     Deatra Robinson, MD   ______________________________ C. Ulyess Mort, M.D.    NK/MEDQ  D:  01/20/2011  T:  01/20/2011  Job:  [id number]  cc:   Clerance Lav, MD PhD  Electronically Signed by Deatra Robinson  on 01/26/2011 07:47:53 AM Electronically Signed by Eliezer Lofts M.D. on 01/31/2011 12:47:46 PM

## 2011-02-20 NOTE — Discharge Summary (Signed)
[name], Eugene [name]                ACCOUNT NO.:  [id number]   MEDICAL RECORD NO.:  [id number]          PATIENT TYPE:  OBV   LOCATION:  4731                         FACILITY:  [facility]   PHYSICIAN:  Alvester Morin, M.D.  DATE OF BIRTH:  [birth year]   DATE OF ADMISSION:  12/25/2008  DATE OF DISCHARGE:  12/27/2008                               DISCHARGE SUMMARY   DISCHARGE DIAGNOSES:  1. Dizziness secondary to uncertain etiology.  2. Hypertension.  3. Hyperlipidemia.  4. Obstructive Sleep Apnea  5. History of coronary artery disease.   DISCHARGE MEDICATIONS:  1. Norvasc 10 mg once daily.  2. Simvastatin 20 mg once daily.  3. TriCor 145 mg daily.  4. Lasix 40 mg once daily.  5. Metoprolol 25 mg twice daily.  6. Benicar 40 mg once daily.  7. CPAP at night for sleep apnea.   DISPOSITION AND FOLLOWUP:  The patient to follow up with Dr. Sherryll Burger at the  outpatient clinic on January 14, 2009, at 3:30 p.m.   PROCEDURES PERFORMED:  1. CT scan of head without contrast on January 02, 2009, atrophy and      microvascular ischemic changes.  No acute intracranial process.      MRI and MRA of head with contrast.  No acute intracranial findings.      Left frontal extra-axial mass likely meningioma, size 9 x 16 x 17      mm.  Unremarkable MR angiography of intracranial circulation.      Unremarkable MR angiography of extracranial circulation.  2. Carotid Dopplers, results pending.  3. A 2-D echo, study was limited.  Left ventricular function could not      be fully assessed, however, was good.  Aortic valve cannot be seen      well.  There was definite thickening of the leaflet.  There is a      gradient and mild-to-moderate aortic stenosis is estimated.   CONSULTATIONS:  None.   BRIEF ADMITTING HISTORY AND PHYSICAL:  The patient is an 77 year old  male with complaint of near syncope for last 6 weeks.  The patient  experiences syncope while changing position from sitting to standing on  multiple  occasions.  The patient's symptoms are stable and not  associated with head turning, micturition, food intake, palpitations, or  irregular heartbeat.  It is not associated with low blood pressure,  change in medication, nausea, vomiting, diarrhea, constipation, or  exertion.  He has no complaint of chest pain, dyspnea at rest, or  orthopnea.  The patient had one incident of fall due to tripping 4 weeks  ago. He fell face down, but did not lose consciousness.  He does not  report any head trauma.  The patient does not feel the room is spinning  around him.  The patient does not report any weakness, slurred speech,  difficulty in swallowing, seizures or loss of consciousness.   PHYSICAL EXAMINATION:  VITAL SIGNS:  On admission; temperature 97.5,  blood pressure 149/92, pulse of 51, respiratory rate of 20, and O2  saturation 98% on room air.  GENERAL:  The  patient does not have acute distress.  EYES:  EOMI.  PERRLA.  ENT:  Pink moist mucosa.  Good dentition.  NECK:  Supple.  Full range of motion.  Carotid bruit bilaterally.  No  thyromegaly.  RESPIRATORY:  Clear to auscultation bilaterally.  No crackles.  CARDIOVASCULAR:  S1 and S2 normal.  Distant heart sounds, systolic  murmur present in aortic area.  GI:  Soft and nontender.  Bowel sounds present.  No hepatosplenomegaly.  EXTREMITIES:  Bilateral pedal edema, +2.  LYMPH NODE:  No lymphadenopathy.  MUSCULOSKELETAL:  No edema.  Decreased power globally, but still 5/5.  No joint swelling.  NEUROLOGIC:  Alert and oriented x3.  Cranial nerve II through XII  intact.  Visual fields intact.  Right vision acuity poor compared to  left .  Motor 5/5 in all extremities.  Sensory, normal bilaterally.  Reflexes normal.  Babinski negative.  Cerebellar signs negative.  PSYCH:  Appropriate.   LABORATORIES:  Sodium 139, potassium 3.7, chloride 107, bicarb 24, BUN  12, creatinine 0.85, glucose of 103, bilirubin of 1, alk phosphatase 45,  AST 34, ALT  45, protein 6.5, albumin 3.9, and calcium 9.6.  Hemoglobin  15.5, white count of 9.9, platelets of 220, ANC of 6.5, and MCV of 90.5.  Urinanalysis clear.  Urine micro negative for infection.  Point-of-care  cardiac enzymes x2 normal.  Chest x-ray shows cardiomegaly with central  vascular prominence and mild loss of height in mid thoracic vertebra,  degenerative changes in the back.   HOSPITAL COURSE:  1. Dizziness.  The patient on admission was evaluated for symptoms      similar to presyncope.  The patient was negative for orthostatic      hypertension.  The patient did not have any nystagmus for      cerebellar signs.  The patient was admitted to the telemetry to      monitor arrhythmia.  The patient's EKG was showing mild      bradycardia, but otherwise normal rhythm.  There were no signs of      myocardial infarction.  The patient's cardiac enzymes were normal.      The patient was further investigated with MRI, MRA, and carotid      Doppler for vertebrobasilar insufficiency as well as for      compromised carotid circulation.  These tests were negative.  The      patient did not have any events on telemetry.  The patient's      medication list was reviewed.  The patient's medication list was      not up-to-date as dosage of metoprolol was not known.  The patient      was thought to have been taking 100 mg twice a day of metoprolol.      The patient was started on 25 mg of metoprolol twice a day while in      the hospital.  The patient did not report of any syncopal attack      during the first 2 days of admission.  The patient was discharged      home with instructions to call if syncope or fall was experienced.      At this time, etiology for his dizziness is unclear.  It is      possible that the patient is experiencing postural presyncope or      mild viral labyrynthitis.  2. Hypertension.  During admission, the patient was kept on amlodipine      10  mg, Lasix 40 mg, and Benicar  40 mg once daily dosages.  The      patient was also started on metoprolol 25 mg twice daily.  The      patient did not experience any bradycardia with antihypertensive      medications.  The patient's blood pressure was reasonably      controlled with systolic blood pressure between 140-150.  Further      management will be considered on an outpatient basis.  3. Hyperlipidemia.  The patient's lasting lipid panel had cholesterol      of 112, triglyceride of 174, HDL of 25, LDL of 52, and VLDL of 35.      The patient was continued on TriCor and put on simvastatin 20 mg at      the time of discharge.  The patient will be further followup at the      outpatient clinic.  4. Obstructive sleep apnea.  The patient was continued on CPAP at      night.  The patient did not have any additional complaints.  5. History of coronary artery disease.  The patient on this admission      had no signs of angina or ischemia.  The patient's cardiac enzymes      x3 were negative.  EKG remained unchanged.  There was no events on      telemetry.  The patient would be continued to be followed on the      outpatient clinic basis.   At the time of discharge, the patient's vitals were as following.  He  had temperature of 97.8, blood pressure of 141/102, pulse of 87,  respiratory rate of 19, and O2 saturation of 97% on room air.      Clerance Lav, MD PhD  Electronically Signed      Alvester Morin, M.D.  Electronically Signed    RS/MEDQ  D:  12/27/2008  T:  12/28/2008  Job:  [id number]   cc:   Ashok Croon  Dr. Sherryll Burger

## 2011-02-23 ENCOUNTER — Encounter: Payer: Self-pay | Admitting: Internal Medicine

## 2011-02-23 ENCOUNTER — Telehealth: Payer: Self-pay | Admitting: *Deleted

## 2011-02-23 ENCOUNTER — Ambulatory Visit (INDEPENDENT_AMBULATORY_CARE_PROVIDER_SITE_OTHER): Payer: Medicare Other | Admitting: Internal Medicine

## 2011-02-23 DIAGNOSIS — I10 Essential (primary) hypertension: Secondary | ICD-10-CM

## 2011-02-23 DIAGNOSIS — I509 Heart failure, unspecified: Secondary | ICD-10-CM

## 2011-02-23 MED ORDER — FUROSEMIDE 40 MG PO TABS: 40.0000 mg | ORAL_TABLET | Freq: Every day | ORAL | Status: DC

## 2011-02-23 NOTE — Patient Instructions (Addendum)
Please make a f/u appointment in 4-6 weeks. Take 1 extra 40 mg lasix today. Then start taking 2 tablets daily on Monday, Wednesday and Friday and 1 tablet on Tuesday, Thursday, Saturday and Sunday. Please record your weight and BP daily as you are doing.

## 2011-02-23 NOTE — Op Note (Signed)
[facility]  Patient:    Eugene [name], Eugene [name]                         MRN: [id number] Proc. Date: 03/05/00 Adm. Date:  [id number] Disc. Date: [id number] Attending:  Haydee Salter                           Operative Report  PREOPERATIVE DIAGNOSIS:  Uncontrolled open-angle glaucoma right eye.  POSTOPERATIVE DIAGNOSIS:  Uncontrolled open-angle glaucoma right eye.  OPERATION:  Implantation of Baerveldt glaucoma implant with scleral reinforcement using tutoplast tissue graft right eye.  ANESTHESIA:  Local with standby.  SURGEON:  Alcide Clever. Eulah Pont, M.D.  JUSTIFICATIONS:  Justification for outpatient surgery, inpatient not required. Justification for overnight stay, not needed.  INDICATION:  The patient is a 77 year old gentleman with advanced glaucoma, uncontrolled intraocular pressure despite maximally medically tolerated therapy right eye.  DESCRIPTION OF PROCEDURE:  The patient was brought to operating room #2 where he was carefully positioned on the operating room table.  IV sedation was given and Dr. Alcide Clever. Cashwell performed a right retrobulbar injection using Marcaine with 0.5% with Wydase added.  The skin about the right eye and right side of the face was prepped and draped as a sterile field.  The lid speculum was inserted.  A 6-0 black silk suture was passed across through the peripheral cornea in the supertemporal quadrant to use as a retraction suture.  The eye was turned down.  An incision was made through conjunctiva in the supertemporal quadrant 6 mm posterior to the limbus.  The incision was carried down to bare sclera through conjunctiva and tenon and then extended concentric with the limbus to the 12 oclock and 9:30 position.  Dissection was carried posteriorly through the initial incision between the superior rectus muscle tendon and the lateral rectus muscle tendon.  The superior rectus muscle was then isolated on the muscle hook,  Tenons capsule was cleaned off.  Laterally, the lateral rectus muscle tendon was isolated on the muscle hook and again Tenons capsule was cleaned off the superior edge of the muscle tendon.  The Baerveldt implant was brought on to the surgical field and irrigated freely.  The Baerveldt was then passed beneath the superior rectus muscle, which was isolated on the muscle hook, and pushed posteriorly to the superior rectus muscle tendon.  Then isolating the lateral rectus muscle tendon on the muscle hook, the lateral wing of the implant was placed beneath the lateral rectus muscle tendon.  The implant was then secured to the globe with two 8-0 nylon interrupted sutures 11 mm posterior to the limbus.  The tube was then trimmed and the surgical limbus was cleaned off using blunt dissection.  A paracentesis track was made at the 8 oclock position at the limbus.  Returning to the supertemporal limbus, the anterior chamber was entered with a 22 gauge needle on the viscoelastic.  The viscoelastic was injected in the anterior chamber to deepen it.  The Baerveldt tube was then placed into the anterior chamber, found to be too long, withdrawn from the anterior chamber, and shortened.  A segment of 5-0 nylon suture was then brought on to the surgical field and sutured to the Baerveldt tube with two 8-0 Vicryl sutures pulled tightly to crush the tube lumen against the 5-0 nylon suture.  The other end of the 5-0 nylon was then buried beneath  the conjunctiva inferotemporally by tunnelling beneath the conjunctiva.  The Baerveldt tube was then passed back into the anterior chamber and found to be in good position.  The tube was then fixed to the globe with a single 8-0 nylon suture which was tied loosely so as not to occlude the tube.  A single slit was made through the wall of the tube with a 15 degree super sharp blade.  A piece of tutoplast was folded over double thickness and placed over the tube  exit from the eye at the limbus back to the anterior edge of the implant.  This was sutured to the globe with four interrupted 8-0 Vicryl sutures.  The conjunctival tenon incision then was repaired with a running 8-0 Vicryl suture.  During the final stages of the procedure, two drops of chloramphenicol and two drops of scopolamine were applied to the eye every four minutes for a total of four applications.  At the conclusion of the procedure, the superior retraction suture was cut and removed.  The viscoelastic was irrigated from the anterior chamber by irrigating through the paracentesis tract.  Three-quarters of a cc of Celestone was injected subconjunctivally in the lower cul-de-sac.  During the entire procedure, the cornea was covered with a corneal light guard.  The lid speculum was removed.  The eye was dressed with two drops of chloramphenicol and scopolamine with a light eye pad and an eye shield after removing the surgical drape.  The patient was carried to the recovery area in good condition.  He is to be followed up in my office on Mar 06, 2000 at 8:30 a.m. DD:  03/05/00 TD:  03/07/00 Job: [id number]

## 2011-02-23 NOTE — Telephone Encounter (Signed)
Agree 

## 2011-02-23 NOTE — Telephone Encounter (Signed)
telenurse from [employer] calls to say when she checked on pt this am, he stated he was having trouble ambulating, dizzy, wt gain from 5/15 to this am 2 1/2 to 3 lbs. Pt states "i'm just not feeling right" seems to be short of breath at end of sentences. Cannot truly tell how he is taking his lasix or how much. Will be seen in clinic this pm at 1545.

## 2011-02-23 NOTE — Assessment & Plan Note (Addendum)
Eugene [name] reports having a 3 pound weight gain in last 3 days as per his log. He has gained 6 pounds since last visit on the April 16th 2012.  He has 2+ bilateral leg edema up to the knees and 1+ up to the mid thighs, which is not usual for him. We'll give one more dose of 40 milligrams Lasix today and start him on 80 mg Lasix on Monday Wednesday Friday and 40 mg on other days, after discussing with Dr. Aundria Rud. Will encourage him to keep a daily log of blood pressure and weight and call the clinic if significant changes occurs.

## 2011-02-23 NOTE — Assessment & Plan Note (Signed)
Blood pressure 155/76, mildly elevated. Will do an extra dose of Lasix today due to his increased fluid retention and hopefully it will help lowering his blood pressure. We'll not do any other changes today as his too dedicate in terms of his fluid balance and blood pressure.

## 2011-02-23 NOTE — Progress Notes (Signed)
  Subjective:    Patient ID: Eugene [name], male    DOB: 05/11/1924, 77 y.o.   MRN: [id number]  HPIMr. [name] is a pleasant 77 yo man with PMH of CHF, HTN, who comes to the clinic with chief complaint of weight gain and leg swelling.  He keeps his regular weight log- which shows that he has gained about 3 pounds in last 3 days. From his last visit in the clinic on 01/22/2011 his gait about 6 pounds in total. He also has increased bilateral lower activity swellings starting from the foot going up to thighs. Complains of mild short of breath with walking using cane but none at rest. Uses a CPAP at night and so doesn't complain of PND. Denies any fever cough, chest pain, abdominal pain, nausea, vomiting, vision changes.   Review of Systems    as per history of present illness Objective:   Physical Exam    Constitutional: Vital signs reviewed.  Patient is a well-developed and well-nourished in no acute distress and cooperative with exam. Alert and oriented x3.  Head: Normocephalic and atraumatic Mouth: no erythema or exudates, MMM Eyes: PERRL, EOMI, conjunctivae normal, No scleral icterus.  Neck: Supple, Trachea midline normal ROM, No JVD. Negative hepatojugular reflux Cardiovascular: RRR, S1 normal, S2 normal Pulmonary/Chest: CTAB, no wheezes, rales, or rhonchi Abdominal: Soft. Non-tender, non-distended, bowel sounds are normal, no masses, organomegaly, or guarding present.  GU: no CVA tenderness Extremities : 2+ edema bilaterally from foot up to the knees, 1+ edema from knee up to the mid thighs  Musculoskeletal: No joint deformities, erythema, or stiffness, ROM full and no nontender Neurological: A&O x3, Strenght is normal and symmetric bilaterally, cranial nerve II-XII are grossly intact, no focal motor deficit, sensory intact to light touch bilaterally.  Skin: Warm, dry and intact. Shiny skin in bilateral legs and hyperpigmentation and venous insufficiency.   Assessment & Plan:

## 2011-03-07 ENCOUNTER — Other Ambulatory Visit (INDEPENDENT_AMBULATORY_CARE_PROVIDER_SITE_OTHER): Payer: Medicare Other | Admitting: *Deleted

## 2011-03-07 DIAGNOSIS — I509 Heart failure, unspecified: Secondary | ICD-10-CM

## 2011-03-07 MED ORDER — CARVEDILOL 6.25 MG PO TABS: 6.2500 mg | ORAL_TABLET | Freq: Two times a day (BID) | ORAL | Status: DC

## 2011-03-15 ENCOUNTER — Other Ambulatory Visit: Payer: Self-pay | Admitting: Internal Medicine

## 2011-03-19 ENCOUNTER — Ambulatory Visit (INDEPENDENT_AMBULATORY_CARE_PROVIDER_SITE_OTHER): Payer: Medicare Other | Admitting: Internal Medicine

## 2011-03-19 ENCOUNTER — Encounter: Payer: Self-pay | Admitting: Internal Medicine

## 2011-03-19 DIAGNOSIS — I10 Essential (primary) hypertension: Secondary | ICD-10-CM

## 2011-03-19 DIAGNOSIS — I509 Heart failure, unspecified: Secondary | ICD-10-CM

## 2011-03-19 DIAGNOSIS — R001 Bradycardia, unspecified: Secondary | ICD-10-CM

## 2011-03-19 DIAGNOSIS — I498 Other specified cardiac arrhythmias: Secondary | ICD-10-CM

## 2011-03-19 DIAGNOSIS — K921 Melena: Secondary | ICD-10-CM

## 2011-03-19 DIAGNOSIS — R600 Localized edema: Secondary | ICD-10-CM

## 2011-03-19 DIAGNOSIS — R609 Edema, unspecified: Secondary | ICD-10-CM

## 2011-03-19 NOTE — Assessment & Plan Note (Signed)
His blood pressure regimen is changed now because of the increased dose of Lasix. I have confirmed with the patient that he understands the dosing. He only take 5 mg of Norvasc

## 2011-03-19 NOTE — Progress Notes (Signed)
  Subjective:    Patient ID: Eugene [name], male    DOB: [birth year], 77 y.o.   MRN: [id number]  HPI Eugene [name] is an 77 year old male who presents here for followup.he has long-standing history of hypertension, dizziness, congestive heart failure, and bradycardia which is often medication induced.  Patient reports that he takes Lasix 80 mg a day on every other day. He has gained about 9 pounds since his last visit. He is usually controlling his diet but continues covered dish at the church on Sundays.his dizziness is well controlled. He is having some edema in his leg. He does not have fever nausea vomiting. He does not have any palpitation or chest pains.   Review of Systems  Constitutional: Negative for fever, chills, activity change and appetite change.  HENT: Negative for nosebleeds, facial swelling, neck pain and tinnitus.   Eyes: Negative for pain, discharge and visual disturbance.  Respiratory: Negative for cough, chest tightness and shortness of breath.   Cardiovascular: Positive for leg swelling. Negative for chest pain and palpitations.  Gastrointestinal: Negative for nausea, vomiting, abdominal pain, blood in stool and abdominal distention.  Musculoskeletal: Positive for arthralgias and gait problem.  Skin: Negative for rash.  Neurological: Positive for dizziness. Negative for seizures, weakness and headaches.  Psychiatric/Behavioral: Negative for suicidal ideas, confusion and agitation.       Objective:   Physical Exam  Constitutional: He is oriented to person, place, and time. He appears well-developed and well-nourished. No distress.  HENT:  Head: Normocephalic and atraumatic.  Right Ear: External ear normal.  Left Ear: External ear normal.  Nose: Nose normal.  Eyes: Conjunctivae and EOM are normal. Pupils are equal, round, and reactive to light.  Neck: Normal range of motion. Neck supple.  Cardiovascular: Normal rate, regular rhythm, normal heart sounds and intact  distal pulses.   Pulmonary/Chest: Effort normal and breath sounds normal. He has no wheezes. He has no rales.  Abdominal: Soft. Bowel sounds are normal. He exhibits no distension. There is no tenderness. There is no rebound and no guarding.  Musculoskeletal: Normal range of motion. He exhibits edema.  Neurological: He is alert and oriented to person, place, and time. He has normal reflexes.  Skin: He is not diaphoretic.  Psychiatric: He has a normal mood and affect. His behavior is normal. Judgment and thought content normal.          Assessment & Plan:

## 2011-03-19 NOTE — Assessment & Plan Note (Signed)
His daughter at home is running between 50 and 60 per minute. I would continue his Coreg at the current dose.

## 2011-03-19 NOTE — Assessment & Plan Note (Signed)
I have asked him to take 80 mg Lasix every day. He'll reduce his amlodipine dose through 5 mg once a day. This allowed to reduce his fluid overload while maintaining his blood pressure. He often has dizziness and hypotension along with orthostasis with aggressive diuresis. He'll follow up in 2 weeks.

## 2011-03-19 NOTE — Patient Instructions (Signed)
Return in one month Call us when your home weight reaches 208 lbs. Your fluid pill is twice a day, every day for now. Your norvasc/amlodipine is 1/2 pill a day now.

## 2011-03-19 NOTE — Assessment & Plan Note (Signed)
Fisher reports that he does not notice any blood in the stool. Is not a candidate for colposcopy at this time. His followed by Dr. Madilyn Fireman at the [facility].

## 2011-04-02 ENCOUNTER — Other Ambulatory Visit: Payer: Self-pay | Admitting: Internal Medicine

## 2011-04-09 ENCOUNTER — Other Ambulatory Visit: Payer: Self-pay | Admitting: *Deleted

## 2011-04-09 NOTE — Telephone Encounter (Signed)
Increase in dosing was done at pt's last visit.

## 2011-04-10 MED ORDER — FUROSEMIDE 40 MG PO TABS: 80.0000 mg | ORAL_TABLET | Freq: Every day | ORAL | Status: DC

## 2011-04-13 ENCOUNTER — Emergency Department (HOSPITAL_COMMUNITY): Payer: Medicare Other

## 2011-04-13 ENCOUNTER — Emergency Department (HOSPITAL_COMMUNITY)
Admission: EM | Admit: 2011-04-13 | Discharge: 2011-04-14 | Disposition: A | Discharge status: Home / Self Care | Payer: Medicare Other | Attending: Emergency Medicine | Admitting: Emergency Medicine

## 2011-04-13 DIAGNOSIS — Z79899 Other long term (current) drug therapy: Secondary | ICD-10-CM | POA: Insufficient documentation

## 2011-04-13 DIAGNOSIS — I2510 Atherosclerotic heart disease of native coronary artery without angina pectoris: Secondary | ICD-10-CM | POA: Insufficient documentation

## 2011-04-13 DIAGNOSIS — I4891 Unspecified atrial fibrillation: Secondary | ICD-10-CM | POA: Insufficient documentation

## 2011-04-13 DIAGNOSIS — I498 Other specified cardiac arrhythmias: Secondary | ICD-10-CM | POA: Insufficient documentation

## 2011-04-13 DIAGNOSIS — I10 Essential (primary) hypertension: Secondary | ICD-10-CM | POA: Insufficient documentation

## 2011-04-13 LAB — URINALYSIS, ROUTINE W REFLEX MICROSCOPIC
Bilirubin Urine: NEGATIVE
Nitrite: NEGATIVE
Protein, ur: NEGATIVE mg/dL
Specific Gravity, Urine: 1.01 (ref 1.005–1.030)
Urobilinogen, UA: 0.2 mg/dL (ref 0.0–1.0)

## 2011-04-13 LAB — DIFFERENTIAL
Basophils Absolute: 0.1 10*3/uL (ref 0.0–0.1)
Basophils Relative: 1 % (ref 0–1)
Eosinophils Absolute: 0.3 10*3/uL (ref 0.0–0.7)
Monocytes Absolute: 1.1 10*3/uL — ABNORMAL HIGH (ref 0.1–1.0)
Neutro Abs: 5.6 10*3/uL (ref 1.7–7.7)
Neutrophils Relative %: 65 % (ref 43–77)

## 2011-04-13 LAB — CBC
Hemoglobin: 12.6 g/dL — ABNORMAL LOW (ref 13.0–17.0)
MCH: 27.3 pg (ref 26.0–34.0)
MCHC: 32.6 g/dL (ref 30.0–36.0)
Platelets: 203 10*3/uL (ref 150–400)

## 2011-04-13 LAB — COMPREHENSIVE METABOLIC PANEL
Albumin: 3.2 g/dL — ABNORMAL LOW (ref 3.5–5.2)
Alkaline Phosphatase: 73 U/L (ref 39–117)
BUN: 19 mg/dL (ref 6–23)
Calcium: 9.1 mg/dL (ref 8.4–10.5)
Creatinine, Ser: 0.88 mg/dL (ref 0.50–1.35)
Potassium: 3.8 mEq/L (ref 3.5–5.1)
Total Protein: 5.9 g/dL — ABNORMAL LOW (ref 6.0–8.3)

## 2011-04-13 LAB — CK TOTAL AND CKMB (NOT AT ARMC): CK, MB: 4.6 ng/mL — ABNORMAL HIGH (ref 0.3–4.0)

## 2011-04-13 LAB — PROTIME-INR
INR: 1.06 (ref 0.00–1.49)
Prothrombin Time: 14 seconds (ref 11.6–15.2)

## 2011-04-14 LAB — URINE CULTURE
Colony Count: 45000
Culture  Setup Time: 201207062302

## 2011-04-30 ENCOUNTER — Encounter: Payer: Self-pay | Admitting: *Deleted

## 2011-05-01 ENCOUNTER — Encounter: Payer: Self-pay | Admitting: Cardiology

## 2011-05-01 ENCOUNTER — Ambulatory Visit (INDEPENDENT_AMBULATORY_CARE_PROVIDER_SITE_OTHER): Payer: Medicare Other | Admitting: Cardiology

## 2011-05-01 DIAGNOSIS — I498 Other specified cardiac arrhythmias: Secondary | ICD-10-CM

## 2011-05-01 DIAGNOSIS — I10 Essential (primary) hypertension: Secondary | ICD-10-CM

## 2011-05-01 DIAGNOSIS — R001 Bradycardia, unspecified: Secondary | ICD-10-CM

## 2011-05-01 DIAGNOSIS — I2510 Atherosclerotic heart disease of native coronary artery without angina pectoris: Secondary | ICD-10-CM

## 2011-05-01 DIAGNOSIS — I509 Heart failure, unspecified: Secondary | ICD-10-CM

## 2011-05-01 DIAGNOSIS — E785 Hyperlipidemia, unspecified: Secondary | ICD-10-CM

## 2011-05-01 DIAGNOSIS — I471 Supraventricular tachycardia: Secondary | ICD-10-CM | POA: Insufficient documentation

## 2011-05-01 NOTE — Assessment & Plan Note (Signed)
He has a mildly reduced EF (40%) but he seems to be euvolemic.  At this point, no change in therapy is indicated.  We have reviewed salt and fluid restrictions.  No further cardiovascular testing is indicated.

## 2011-05-01 NOTE — Progress Notes (Signed)
HPI This nice gentleman has a history of atrial arrhythmia and was seen in the past by Dr. Graciela Husbands.  He has a distant history of coronary disease status post PCI though I can find no description of this. He also has a mildly reduced ejection fraction with an EF of about 40%. This has been managed medically. I reviewed his last echo from February. He had some moderate aortic stenosis. His pulmonary artery pressures were mildly elevated. He was sent here after being in the emergency room with a narrow complex tachycardia. I reviewed his EKG. He had a rate of 146 on presentation on July 6. He felt anxious. He did think his heart was racing.  He was mildly short of breath.  He did not have any chest pressure, neck or arm discomfort. He broke to sinus rhythm after treatment with adenosine. I do not have the strips. He since did notice his heart rate being about 130 just this morning when he took his blood pressure. He fell it a little in his chest but it broke with a bowel movement. He otherwise doesn't think he's had any sustained episodes. He gets around in his house doing some light chores. With this he denies any chest pressure, neck or arm discomfort. He said no presyncope or syncope. He denies any PND or orthopnea. Of note I reviewed a blood pressure diary today and his blood pressures range from a systolic of 88-174. Diastolics are typically in the 65s.  Allergies  Allergen Reactions  . Penicillins     REACTION: "turns red"    Current Outpatient Prescriptions  Medication Sig Dispense Refill  . amLODipine (NORVASC) 10 MG tablet Take 5 mg by mouth daily.       Marland Kitchen aspirin 81 MG tablet Take 81 mg by mouth daily.        . carvedilol (COREG) 6.25 MG tablet Take 1 tablet (6.25 mg total) by mouth 2 (two) times daily with a meal.  180 tablet  2  . clotrimazole (LOTRIMIN) 1 % cream Apply topically 2 (two) times daily.        Marland Kitchen docusate sodium (COLACE) 100 MG capsule Take 100 mg by mouth 2 (two) times daily.         . furosemide (LASIX) 40 MG tablet Take 2 tablets (80 mg total) by mouth daily.  60 tablet  2  . losartan (COZAAR) 50 MG tablet TAKE 1 TABLET BY MOUTH EVERY DAY  30 tablet  4  . loteprednol (LOTEMAX) 0.5 % ophthalmic suspension Place 1 drop into the right eye 4 (four) times daily.        . naproxen sodium (ANAPROX) 220 MG tablet Take 220 mg by mouth 2 (two) times daily with a meal.        . simvastatin (ZOCOR) 40 MG tablet Take 1 tablet (40 mg total) by mouth at bedtime.  30 tablet  3  . travoprost, benzalkonium, (TRAVATAN) 0.004 % ophthalmic solution Place 1 drop into the right eye at bedtime.          Past Medical History  Diagnosis Date  . Glaucoma     Right eye  . Diverticulosis   . Macular degeneration of right eye   . Allergic rhinitis   . CAD (coronary artery disease)     status post percutaneous transluminal coronary angioplasty in 1991.  . OSA on CPAP   . Hypertension   . Hyperlipidemia   . Chronic CHF (congestive heart failure)  ejection fraction of 40-45%, grade 2 diastolic  dysfunction, and mild aortic stenosis  . Irregular heart beat     Past Surgical History  Procedure Date  . Implantation of baerveldt glaucoma implant with scleral reinforcement using tutoplast tissue graft right eye.   . Coronary angioplasty with stent placement     ROS:  Positive for difficulty hearing. Otherwise as stated in the history of present illness negative for all other systems.  PHYSICAL EXAM BP 144/68  Pulse 74  Resp 16  Ht 5\' 7"  (1.702 m)  Wt 218 lb (98.884 kg)  BMI 34.14 kg/m2 GENERAL:  Well appearing HEENT:  Pupils equal round and reactive, fundi not visualized, oral mucosa unremarkable NECK:  No jugular venous distention, waveform within normal limits, carotid upstroke brisk and symmetric, no bruits, no thyromegaly LYMPHATICS:  No cervical, inguinal adenopathy LUNGS:  Clear to auscultation bilaterally BACK:  No CVA tenderness CHEST:  Unremarkable HEART:  PMI not  displaced or sustained,S1 and S2 within normal limits, no S3, no S4, no clicks, no rubs, early apical systolic murmurs ABD:  Flat, positive bowel sounds normal in frequency in pitch, no bruits, no rebound, no guarding, no midline pulsatile mass, no hepatomegaly, no splenomegaly EXT:  2 plus pulses upper, diminished PT bilateral lower, no cyanosis no clubbing, moderate bilateral edema SKIN:  No rashes no nodules NEURO:  Cranial nerves II through XII grossly intact, motor grossly intact throughout PSYCH:  Cognitively intact, oriented to person place and time  ASSESSMENT AND PLAN

## 2011-05-01 NOTE — Assessment & Plan Note (Signed)
His blood pressure is labile. I will continue the meds as listed.

## 2011-05-01 NOTE — Patient Instructions (Addendum)
Please continue your current medications as listed. You are being referred to Dr Graciela Husbands.

## 2011-05-01 NOTE — Assessment & Plan Note (Addendum)
At this point managing him medically might prove difficult with his labile blood pressures. 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 There was no mention of this being fibrillation or flutter. It broke with adenosine. He would not be a good anticoagulation candidate regardless as he's had recent GI bleeding. I will schedule him to see Dr. Graciela Husbands in follow up to discuss further therapies.  I did discuss vagal maneuvers.   (Greater than 40 minutes reviewing all data with greater than 50% face to face with the patient).

## 2011-05-01 NOTE — Assessment & Plan Note (Signed)
The patient has no new sypmtoms.  No further cardiovascular testing is indicated.  We will continue with aggressive risk reduction and meds as listed.  

## 2011-05-02 ENCOUNTER — Encounter: Payer: Self-pay | Admitting: Internal Medicine

## 2011-05-02 ENCOUNTER — Ambulatory Visit (INDEPENDENT_AMBULATORY_CARE_PROVIDER_SITE_OTHER): Payer: Medicare Other | Admitting: Internal Medicine

## 2011-05-02 DIAGNOSIS — I471 Supraventricular tachycardia: Secondary | ICD-10-CM

## 2011-05-02 DIAGNOSIS — I2510 Atherosclerotic heart disease of native coronary artery without angina pectoris: Secondary | ICD-10-CM

## 2011-05-02 DIAGNOSIS — I509 Heart failure, unspecified: Secondary | ICD-10-CM

## 2011-05-02 DIAGNOSIS — I498 Other specified cardiac arrhythmias: Secondary | ICD-10-CM

## 2011-05-02 MED ORDER — AMLODIPINE BESYLATE 5 MG PO TABS: 5.0000 mg | ORAL_TABLET | Freq: Every day | ORAL | Status: DC

## 2011-05-02 MED ORDER — CARVEDILOL 12.5 MG PO TABS: 12.5000 mg | ORAL_TABLET | Freq: Two times a day (BID) | ORAL | Status: DC

## 2011-05-02 NOTE — Patient Instructions (Addendum)
Your physician has recommended you make the following change in your medication:  1) Increase Coreg (carvedilol) to 12.5mg  twice daily.  Decrease your fluid intake.  Your physician wants you to follow-up in: 6 months. You will receive a reminder letter in the mail two months in advance. If you don't receive a letter, please call our office to schedule the follow-up appointment.

## 2011-05-02 NOTE — Progress Notes (Signed)
Eugene [name] is seen at the request of Dr. Antoine Poche because of documented  adenosine sensitive supraventricular tachycardia.  He has a remote history of a largely asymptomatic atrial arrhythmia. Over the last number of months he has had episodes on his daily blood pressure log where his heart rate was 130 or so. These are unassociated with major symptoms apart from frequent urination. They're somewhat throughout positive. There is a vague chest uneasiness but no discomfort shortness of breath or lightheadedness. He was seen in the emergency room for these episodes in early July. Electro-cardiograms were obtained. See below. He was given adenosine.   He has a distant history of coronary disease status post PCI though I can find no description of this. He also has a mildly reduced ejection fraction with an EF of about 40%. This has been managed medically.  He had some moderate aortic stenosis. His pulmonary artery pressures were mildly elevated.  He has a problem with peripheral edema. This dates back to a hospitalization for pneumonia and heart failure last winter during which  Time he underwent a diuresis of about 25 pounds  Allergies  Allergen Reactions  . Penicillins     REACTION: "turns red"    Current Outpatient Prescriptions  Medication Sig Dispense Refill  . amLODipine (NORVASC) 10 MG tablet Take 5 mg by mouth daily.       Marland Kitchen aspirin 81 MG tablet Take 81 mg by mouth daily.        . carvedilol (COREG) 6.25 MG tablet Take 1 tablet (6.25 mg total) by mouth 2 (two) times daily with a meal.  180 tablet  2  . clotrimazole (LOTRIMIN) 1 % cream Apply topically 2 (two) times daily.        Marland Kitchen docusate sodium (COLACE) 100 MG capsule Take 100 mg by mouth 2 (two) times daily.        . furosemide (LASIX) 40 MG tablet Take 2 tablets (80 mg total) by mouth daily.  60 tablet  2  . losartan (COZAAR) 50 MG tablet TAKE 1 TABLET BY MOUTH EVERY DAY  30 tablet  4  . loteprednol (LOTEMAX) 0.5 % ophthalmic  suspension Place 1 drop into the right eye 4 (four) times daily.        . naproxen sodium (ANAPROX) 220 MG tablet Take 220 mg by mouth 2 (two) times daily with a meal.        . simvastatin (ZOCOR) 40 MG tablet Take 1 tablet (40 mg total) by mouth at bedtime.  30 tablet  3  . travoprost, benzalkonium, (TRAVATAN) 0.004 % ophthalmic solution Place 1 drop into the right eye at bedtime.          Past Medical History  Diagnosis Date  . Glaucoma     Right eye  . Diverticulosis   . Macular degeneration of right eye   . Allergic rhinitis   . CAD (coronary artery disease)     status post percutaneous transluminal coronary angioplasty in 1991.  . OSA on CPAP   . Hypertension   . Hyperlipidemia   . Chronic CHF (congestive heart failure)     ejection fraction of 40-45%, grade 2 diastolic  dysfunction, and mild aortic stenosis  . Irregular heart beat     Past Surgical History  Procedure Date  . Implantation of baerveldt glaucoma device lant with scleral reinforcement using tutoplast tissue graft right eye.   . Coronary angioplasty with stent placement     ROS:  Positive for difficulty  hearing. Otherwise as stated in the history of present illness negative for all other systems.  PHYSICAL EXAM BP 147/74  Pulse 54  Resp 14  Ht 5\' 7"  (1.702 m)  Wt 218 lb (98.884 kg)  BMI 34.14 kg/m2 GENERAL:  Well appearing HEENT:  Pupils equal round and reactive,  , oral mucosa unremarkable NECK:  No jugular venous distention,   carotid upstroke brisk and symmetric, no bruits, no thyromegaly LYMPHATICS:  No cervical, Supraclavicular not  LUNGS:  Clear to auscultation bilaterally BACK:  No CVA tenderness CHEST:  Unremarkable HEART:  PMI not displaced or sustained,S1 and S2 within normal limits, no S3, no S4, no clicks, no rubs, early apical systolic murmurs ABD:  Flat, positive bowel sounds normal in frequency in pitch, no bruits, no rebound, no guarding, no midline pulsatile mass, no hepatomegaly EXT:   2 plus pulses upper, diminished PT bilateral lower, no cyanosis no clubbing, moderate bilateral edema SKIN:  No rashes no nodules NEURO:  Cranial nerves II through XII grossly intact, motor grossly intact throughout PSYCH:  Cognitively intact, oriented to person place and time   ASSESSMENT AND PLAN

## 2011-05-02 NOTE — Assessment & Plan Note (Addendum)
The patient has few symptoms. He would like to continue his current therapeutic course. In the event that he has more frequent episodes or more symptoms we could certainly consider catheter ablation I would probably AV nodal reentry. fior now will increase his coreg

## 2011-05-02 NOTE — Assessment & Plan Note (Signed)
Currently stable. I do wonder though with the inability to mobilize fluid whether there isn't A. Ischemic component.

## 2011-05-02 NOTE — Assessment & Plan Note (Addendum)
He is evidence of volume overload. I would be inclined to increase his diuretics But his volume intake is quite copious. I encouraged him to decrease his fluid intake and see if weekend help with his edema this way. Furthermore, some of this may be related to his amlodipine. We will cut that in.half.

## 2011-05-03 ENCOUNTER — Ambulatory Visit (INDEPENDENT_AMBULATORY_CARE_PROVIDER_SITE_OTHER): Payer: Medicare Other | Admitting: Internal Medicine

## 2011-05-03 ENCOUNTER — Encounter: Payer: Self-pay | Admitting: Internal Medicine

## 2011-05-03 DIAGNOSIS — R001 Bradycardia, unspecified: Secondary | ICD-10-CM

## 2011-05-03 DIAGNOSIS — I471 Supraventricular tachycardia: Secondary | ICD-10-CM

## 2011-05-03 DIAGNOSIS — I509 Heart failure, unspecified: Secondary | ICD-10-CM

## 2011-05-03 DIAGNOSIS — K5730 Diverticulosis of large intestine without perforation or abscess without bleeding: Secondary | ICD-10-CM

## 2011-05-03 DIAGNOSIS — I2510 Atherosclerotic heart disease of native coronary artery without angina pectoris: Secondary | ICD-10-CM

## 2011-05-03 DIAGNOSIS — I498 Other specified cardiac arrhythmias: Secondary | ICD-10-CM

## 2011-05-03 DIAGNOSIS — I10 Essential (primary) hypertension: Secondary | ICD-10-CM

## 2011-05-03 DIAGNOSIS — E785 Hyperlipidemia, unspecified: Secondary | ICD-10-CM

## 2011-05-03 MED ORDER — CARVEDILOL 6.25 MG PO TABS: 6.2500 mg | ORAL_TABLET | Freq: Two times a day (BID) | ORAL | Status: DC

## 2011-05-03 NOTE — Assessment & Plan Note (Signed)
Likely secondary to increased dose of carvedilol. Will go back down to 6.25 mg as this dose keeps patient's heart rate stable in the 60s.

## 2011-05-03 NOTE — Assessment & Plan Note (Signed)
Stable per patient. No recent episodes of bloody bowel movements. We will continue to monitor.

## 2011-05-03 NOTE — Progress Notes (Signed)
  Subjective:    Patient ID: SOPHIE QUILES, male    DOB: 1924-03-16, 77 y.o.   MRN: [id number]  HPI  Mr. Frizell is a 77 year old man with past medical history significant for HLD, HTN, CAD, and diverticulosis who presents for general check up today. Patient was recently seen in the ED for 07/06 for irregular heart rhythm. He was seen by Dr. Graciela Husbands shortly after this incident on July 25th, 2012 where his Coreg was increased to 12.5 mg.   Patient's chloride was increased to 12.5 mg and patient states that he took this dose today. Patient reports feeling dizzy and rather unsteady on his feet this morning. Patient denies chest pain palpitations shortness of breath nausea vomiting or changes in bowel or urinary character. Patient reports that he also is taking Norvasc at a lower dose at Dr. Odessa Fleming recommendation. Patient has been recording his blood pressure in a log book and blood pressure has ranged from 130 to 140 systolic with a pulse of 60. Patient also checks his weight on a daily basis which ranged between 260 210 pounds. Patient denies any shortness of breath on exertion or upon lying down and has not required increased pillows to sleep.   No other complaints or concerns today.       Dr. Odessa Fleming note:  Mr. Bramer is seen at the request of Dr. Antoine Poche because of documented adenosine sensitive supraventricular tachycardia.  He has a remote history of a largely asymptomatic atrial arrhythmia. Over the last number of months he has had episodes on his daily blood pressure log where his heart rate was 130 or so. These are unassociated with major symptoms apart from frequent urination. They're somewhat throughout positive. There is a vague chest uneasiness but no discomfort shortness of breath or lightheadedness. He was seen in the emergency room for these episodes in early July. Electro-cardiograms were obtained. See below. He was given adenosine.  He has a distant history of coronary disease status  post PCI though I can find no description of this. He also has a mildly reduced ejection fraction with an EF of about 40%. This has been managed medically. He had some moderate aortic stenosis. His pulmonary artery pressures were mildly elevated.  He has a problem with peripheral edema. This dates back to a hospitalization for pneumonia and heart failure last winter during which Time he underwent a diuresis of about 25 pounds    Review of Systems  All other systems reviewed and are negative.       Objective:   Physical Exam  Constitutional: He is oriented to person, place, and time. He appears well-developed.  HENT:  Head: Normocephalic and atraumatic.  Eyes: Pupils are equal, round, and reactive to light.  Neck: Normal range of motion. Neck supple. No JVD present.  Cardiovascular:       Bradycardic  Pulmonary/Chest: Effort normal and breath sounds normal. He has no rales.  Abdominal: Soft. Bowel sounds are normal.  Musculoskeletal:       2+ pitting edema extending to knee, chronic venous stasis changes noted  Neurological: He is alert and oriented to person, place, and time.  Skin:       Chronic venous stasis dermatitis lower extremities bilaterally  Psychiatric: He has a normal mood and affect. Judgment and thought content normal.          Assessment & Plan:

## 2011-05-03 NOTE — Assessment & Plan Note (Signed)
Subjective:    Eugene [name] is here for follow up of dyslipidemia.  Compliance with treatment has been excellent. The patient exercises intermittently. Patient denies muscle pain associated with his medications.  Lab Review Lab Results  Component Value Date   CHOL 196 09/13/2010   CHOL  Value: 161        ATP III CLASSIFICATION:  <200     mg/dL   Desirable  161-096  mg/dL   Borderline High  >=045    mg/dL   High        40/98/1191   CHOL  Value: 112        ATP III CLASSIFICATION:  <200     mg/dL   Desirable  478-295  mg/dL   Borderline High  >=621    mg/dL   High        12/13/6576   TRIG 181* 09/13/2010   TRIG 181* 07/19/2010   TRIG 174* 12/26/2008   HDL 45 09/13/2010   HDL 37* 07/19/2010   HDL 25* 12/26/2008      Assessment:    Dyslipidemia under good control.    Plan:    1. Continue dietary measures. 2. Continue regular exercise. 3. Lipid-lowering medications - simvastatin at current dose. Will check lipid panel when patient is fasting next month.

## 2011-05-03 NOTE — Assessment & Plan Note (Signed)
Lab Results  Component Value Date   NA 142 04/13/2011   K 3.8 04/13/2011   CL 105 04/13/2011   CO2 27 04/13/2011   BUN 19 04/13/2011   CREATININE 0.88 04/13/2011   CREATININE 0.86 12/28/2010    BP Readings from Last 3 Encounters:  05/03/11 136/59  05/02/11 147/74  05/01/11 144/68    Assessment: Hypertension control:  controlled  Progress toward goals:  at goal Barriers to meeting goals:  no barriers identified  Plan: Hypertension treatment:  continue current medications Will reduce Coreg to 6.25 mg as patient was bradycardic today and feeling dizzy.

## 2011-05-03 NOTE — Patient Instructions (Addendum)
Please stop taking Carvedilol 12.5 mg twice a day and go back to taking Carvedilol 6.25 mg twice a day.  Please take all other medications as directed. Please follow up in 2 months and make sure you do not eat anything as you will have blood work done.

## 2011-05-04 NOTE — Assessment & Plan Note (Signed)
Stable. Patient monitors weight daily, and only has slight fluctuations. No orthopnea, DOE, JVD, or any other clinical signs of exacerbation of CHF. Continue lasix, ASA, Bblocker, and ARB.

## 2011-05-04 NOTE — Assessment & Plan Note (Signed)
Stable, without chest pain. Continue bblocker, ASA and statin, keeping in mind interaction between zocor and norvasc. Dose of norvasc reduced because of the potential interaction.

## 2011-05-17 ENCOUNTER — Other Ambulatory Visit: Payer: Self-pay | Admitting: Internal Medicine

## 2011-05-17 DIAGNOSIS — E785 Hyperlipidemia, unspecified: Secondary | ICD-10-CM

## 2011-05-29 ENCOUNTER — Encounter: Payer: Self-pay | Admitting: Internal Medicine

## 2011-07-03 ENCOUNTER — Telehealth: Payer: Self-pay | Admitting: *Deleted

## 2011-07-03 ENCOUNTER — Ambulatory Visit (INDEPENDENT_AMBULATORY_CARE_PROVIDER_SITE_OTHER): Payer: Medicare Other | Admitting: Internal Medicine

## 2011-07-03 ENCOUNTER — Encounter: Payer: Self-pay | Admitting: Internal Medicine

## 2011-07-03 VITALS — BP 195/85 | HR 53 | Temp 97.3°F | Ht 67.0 in | Wt 218.6 lb

## 2011-07-03 DIAGNOSIS — I10 Essential (primary) hypertension: Secondary | ICD-10-CM

## 2011-07-03 DIAGNOSIS — Z23 Encounter for immunization: Secondary | ICD-10-CM

## 2011-07-03 DIAGNOSIS — Z0000 Encounter for general adult medical examination without abnormal findings: Secondary | ICD-10-CM

## 2011-07-03 DIAGNOSIS — E785 Hyperlipidemia, unspecified: Secondary | ICD-10-CM

## 2011-07-03 DIAGNOSIS — R0602 Shortness of breath: Secondary | ICD-10-CM

## 2011-07-03 LAB — BASIC METABOLIC PANEL
BUN: 17 mg/dL (ref 6–23)
Calcium: 9.4 mg/dL (ref 8.4–10.5)
Creat: 0.82 mg/dL (ref 0.50–1.35)

## 2011-07-03 MED ORDER — FUROSEMIDE 40 MG PO TABS: ORAL_TABLET | ORAL | Status: DC

## 2011-07-03 NOTE — Assessment & Plan Note (Signed)
The patient presents due to concerns for shortness of breath expressed by his [employer] nurse (who only speaks with him on the phone, and does not evaluate him in person).  Patient notes no SOB, orthopnea, or PND, and ambulates well in clinic today without a drop in O2 saturation.  The patient does have chronic LE edema, but notes that this has improved recently, not worsened, and his weight has remained stable. -patient's lasix increased to 80 mg qam, 40 mg qpm -patient notes no SOB or dyspnea in clinic today

## 2011-07-03 NOTE — Telephone Encounter (Signed)
Pt's atty calls and states she checks on pt several times weekly and pt has been having increased shortness of breath over last few days, she states he refuses to go to ED, i called pt he states he does not want to go to ED but will come in for appt, states his atty told him he probably needed a chest xray??? He starts and finishes a sentence w/o being breathless as we speak and denies all other problems, appt is made for today at 1400 w/ dr brown

## 2011-07-03 NOTE — Progress Notes (Signed)
HPI The patient is an 77 yo man, history of CHF (EF 40-50%), HTN, HL, and CAD, presenting for SOB.  The patient notes that his [employer] nurse, who checks-in with the patient weekly over the phone, believes he has been getting progressively short of breath over the last few months.  He went to see his attorney yesterday, who also thought the patient was short of breath, and made him an appointment for today.  The patient does not think that he has become more short of breath, and notes no dyspnea or SOB today.  He notes that he can walk for "blocks and blocks" without having to stop to catch his breath, he sleeps with just 1 pillow at night, and he has no PND.  He does note chronic LE edema, but notes that this symptoms is better today than it has been in months.  He notes no cough, sputum production, or fevers.  He has no history of asthma or COPD.  Of note, the patient was recently hospitalized in July 2012 with adenosine-sensitive SVT, and currently follows with Dr. Graciela Husbands.  He currently notes no chest pain, palpitations, lightheadedness, weakness, dizziness, or presyncope.  On presentation today, the patient is noted to have an elevated BP to 195/85, re-checked to 170/76, with a pulse of 53.  Of note, the patient's amlodipine was decreased to 5 mg at his last cardiology appointment 2-3 months ago.  ROS: General: no fevers, chills, changes in weight, changes in appetite Skin: no rash HEENT: no blurry vision, hearing changes, sore throat Pulm: no dyspnea, coughing, wheezing CV: no chest pain, palpitations, shortness of breath Abd: no abdominal pain, nausea/vomiting, diarrhea/constipation GU: no dysuria, hematuria, polyuria Ext: no arthralgias, myalgias Neuro: no weakness, numbness, or tingling  Filed Vitals:   07/03/11 1412  BP: 195/85  Pulse: 53  Temp: 97.3 F (36.3 C)    PEX O2 sat 97% at rest, 94-96% while walking down the hall, no subjective dyspnea while walking General: alert,  cooperative, and in no apparent distress HEENT: pupils equal round and reactive to light, vision grossly intact, oropharynx clear and non-erythematous  Neck: supple, no lymphadenopathy, no JVD Lungs: clear to ascultation bilaterally, normal work of respiration, no wheezes, rales, ronchi Heart: bradycardic, regular rhythm, no murmurs, gallops, or rubs Abdomen: soft, non-tender, non-distended, normal bowel sounds Msk: no joint edema, warmth, or erythema Extremities: 2+ pitting edema in legs bilaterally to below the patient's knee Neurologic: alert & oriented X3, cranial nerves II-XII intact, strength grossly intact, sensation intact to light touch  Assessment/Plan

## 2011-07-03 NOTE — Patient Instructions (Signed)
For your blood pressure, we are increasing your Lasix (Furosemide) to 80 mg in the morning, and 40 mg at night  Please return for a follow-up visit in 1 week, and we will check some blood levels at that time

## 2011-07-03 NOTE — Assessment & Plan Note (Addendum)
Patient found to be hypertensive in clinic today.  Patient takes his BP at home with an electronic device that straps to his wrist, and his readings have been relatively normal and stable at home (though the accuracy of this device is unknown).  Of note, amlodipine was decreased to 5 mg/day at his last cardiology visit 2-3 months ago. -lasix increased to 80 mg qam, 40 mg qpm (from 40 mg BID) -will check BMET today, then again in 1 week to ensure no rise in creatinine or drop in K -patient to return in 1 week for BP re-check, and medication regimen should be adjusted or continued accordingly -patient was instructed to bring his BP monitor to his next visit, so we can check its validity

## 2011-07-03 NOTE — Assessment & Plan Note (Signed)
Patient to return fasting in 1 week, and we will check lipid panel

## 2011-07-03 NOTE — Assessment & Plan Note (Signed)
Flu shot given today

## 2011-07-03 NOTE — Telephone Encounter (Signed)
I agree with the plan for patient to be seen today.

## 2011-07-04 NOTE — Progress Notes (Signed)
I agree with the assessment and plan of Dr. Manson Passey.

## 2011-07-10 ENCOUNTER — Encounter: Payer: Self-pay | Admitting: Internal Medicine

## 2011-07-10 ENCOUNTER — Ambulatory Visit (INDEPENDENT_AMBULATORY_CARE_PROVIDER_SITE_OTHER): Payer: Medicare Other | Admitting: Internal Medicine

## 2011-07-10 DIAGNOSIS — I2510 Atherosclerotic heart disease of native coronary artery without angina pectoris: Secondary | ICD-10-CM

## 2011-07-10 DIAGNOSIS — I471 Supraventricular tachycardia: Secondary | ICD-10-CM

## 2011-07-10 DIAGNOSIS — E785 Hyperlipidemia, unspecified: Secondary | ICD-10-CM

## 2011-07-10 DIAGNOSIS — I509 Heart failure, unspecified: Secondary | ICD-10-CM

## 2011-07-10 DIAGNOSIS — I498 Other specified cardiac arrhythmias: Secondary | ICD-10-CM

## 2011-07-10 DIAGNOSIS — I10 Essential (primary) hypertension: Secondary | ICD-10-CM

## 2011-07-10 LAB — LIPID PANEL
Cholesterol: 168 mg/dL (ref 0–200)
Triglycerides: 242 mg/dL — ABNORMAL HIGH (ref <150)

## 2011-07-10 MED ORDER — AMLODIPINE BESYLATE 5 MG PO TABS: 10.0000 mg | ORAL_TABLET | Freq: Every day | ORAL | Status: DC

## 2011-07-10 MED ORDER — LOSARTAN POTASSIUM 50 MG PO TABS: 50.0000 mg | ORAL_TABLET | Freq: Every day | ORAL | Status: DC

## 2011-07-10 NOTE — Patient Instructions (Signed)
Please, return on Friday for  Your blood pressure check and lab result review.

## 2011-07-10 NOTE — Progress Notes (Signed)
  Subjective:    Patient ID: Eugene [name], male    DOB: 1924/09/17, 77 y.o.   MRN: [id number]  HPI  1. F/u on  HTN and CHF. Seen Dr. Manson Passey on 07/03/2011. Lasix was increased to 80 mg PO q am and 40 mg PO qpm. Needs renal fxn/electrolytes rechecked today. Patient states that he does not feel well with a new Lasix regimen. Patient is compliant with  Fluid and salt intake. Weights himself daily --fluctuates between 208 and 210 lbs. No significant change since increase in Lasix dose.    Review of Systems  Constitutional: Positive for malaise/fatigue. Negative for fever, chills, weight loss and diaphoresis.  HENT: Negative for nosebleeds and congestion.   Eyes: Negative for blurred vision, double vision, photophobia, pain, discharge and redness.  Respiratory: Negative.   Cardiovascular: Negative for chest pain, palpitations, orthopnea, claudication, leg swelling and PND.  Gastrointestinal: Negative.   Genitourinary: Negative.   Musculoskeletal: Negative.   Skin: Negative for itching and rash.  Neurological: Negative.  Negative for weakness and headaches.  Hematological: Negative.   Psychiatric/Behavioral: Negative.    Review of Systems  Constitutional: Positive for malaise/fatigue. Negative for fever, chills, weight loss and diaphoresis.  HENT: Negative for nosebleeds and congestion.   Eyes: Negative for blurred vision, double vision, photophobia, pain, discharge and redness.  Respiratory: Negative.   Cardiovascular: Negative for chest pain, palpitations, orthopnea, claudication, leg swelling and PND.  Gastrointestinal: Negative.   Genitourinary: Negative.   Musculoskeletal: Negative.   Skin: Negative for itching and rash.  Neurological: Negative.  Negative for weakness and headaches.  Endo/Heme/Allergies: Negative.   Psychiatric/Behavioral: Negative.        Objective:   Physical Exam   Vitals:noted General: alert, well-developed, and cooperative to examination.  Head:  normocephalic and atraumatic.  Eyes: vision grossly intact, pupils equal, pupils round, pupils reactive to light, no injection and anicteric.  Mouth: pharynx pink and moist, no erythema, and no exudates.  Neck: supple, full ROM, no thyromegaly, no JVD, and no carotid bruits.  Lungs: normal respiratory effort, no accessory muscle use, normal breath sounds, no crackles, and no wheezes. Heart: normal rate, regular rhythm, no murmur, no gallop, and no rub.  Abdomen: soft, non-tender, normal bowel sounds, no distention, no guarding, no rebound tenderness, no hepatomegaly, and no splenomegaly.  Msk: no joint swelling, no joint warmth, and no redness over joints.  Pulses: 2+ DP/PT pulses bilaterally Extremities: No cyanosis, clubbing, edema Neurologic: alert & oriented X3, cranial nerves II-XII intact, strength normal in all extremities, sensation intact to light touch, and gait normal.  Skin: turgor normal and no rashes.  Psych: Oriented X3, memory intact for recent and remote, normally interactive, good eye contact, not anxious appearing, and not depressed appearing.        Assessment & Plan:  1. HTN with a hx of CHF. Uncontrolled. Will increase amlodipine back up to 10 mg PO daily. Will continue with current Lasix regimen for 3 more days (2 weeks total) prior to making a decision on a dose adjustment. Will order B-met today. 2. HLD. FLP ordered; diet, exercise discussed with he patient.

## 2011-07-11 LAB — COMPLETE METABOLIC PANEL WITH GFR
ALT: 15 U/L (ref 0–53)
Albumin: 4.3 g/dL (ref 3.5–5.2)
BUN: 16 mg/dL (ref 6–23)
CO2: 29 mEq/L (ref 19–32)
Calcium: 9.7 mg/dL (ref 8.4–10.5)
Chloride: 102 mEq/L (ref 96–112)
Creat: 0.86 mg/dL (ref 0.50–1.35)
GFR, Est African American: >60 mL/min (ref >60)
Potassium: 4 mEq/L (ref 3.5–5.3)

## 2011-07-13 ENCOUNTER — Ambulatory Visit (INDEPENDENT_AMBULATORY_CARE_PROVIDER_SITE_OTHER): Payer: Medicare Other | Admitting: Internal Medicine

## 2011-07-13 ENCOUNTER — Encounter: Payer: Self-pay | Admitting: Internal Medicine

## 2011-07-13 DIAGNOSIS — I10 Essential (primary) hypertension: Secondary | ICD-10-CM

## 2011-07-13 DIAGNOSIS — I509 Heart failure, unspecified: Secondary | ICD-10-CM

## 2011-07-13 NOTE — Progress Notes (Signed)
  Subjective:    Patient ID: Eugene [name], male    DOB: 1923/12/21, 77 y.o.   MRN: [id number]  HPI Follow up appointment. Patient reports feeling "much better." Reports no SOB, orthopnea or leg swelling. Denies any symptoms with an increase of his Amlodipine dose.   Review of Systems No new Sx.    Objective:   Physical Exam Vitals: reviewed General: alert, well-developed, and cooperative to examination.  Head: normocephalic and atraumatic.  Eyes: vision grossly intact, pupils equal, pupils round, pupils reactive to light, no injection and anicteric.  Mouth: pharynx pink and moist, no erythema, and no exudates.  Neck: supple, full ROM, no thyromegaly, no JVD, and no carotid bruits.  Lungs: normal respiratory effort, no accessory muscle use, normal breath sounds, no crackles, and no wheezes. Heart: normal rate, regular rhythm, no murmur, no gallop, and no rub.  Abdomen: soft, non-tender, normal bowel sounds, no distention, no guarding, no rebound tenderness, no hepatomegaly, and no splenomegaly.  Msk: no joint swelling, no joint warmth, and no redness over joints.  Pulses: 2+ DP/PT pulses bilaterally Extremities: No cyanosis, clubbing, edema Neurologic: alert & oriented X3, cranial nerves II-XII intact, strength normal in all extremities, sensation intact to light touch, and gait normal.  Skin: turgor normal and no rashes.  Psych: Oriented X3, memory intact for recent and remote, normally interactive, good eye contact, not anxious appearing, and not depressed appearing.          Assessment & Plan:  1. HTN - improved control  With titration of Amlodipine dose up to 10 mg PO daily. 2. CHF, compensated. Continue with Lasix 80 mg PO q am and 40 mg PO q pm. -lab results reviewed with the patient. Renal function is great. 3. Hypertriglyceridemia --patient is reluctant to add another agent which is well-understood. -diet, exercise reviewed with the patient.

## 2011-07-13 NOTE — Patient Instructions (Signed)
Please, follow up on as needed basis.

## 2011-07-17 ENCOUNTER — Other Ambulatory Visit: Payer: Self-pay | Admitting: Internal Medicine

## 2011-07-24 ENCOUNTER — Encounter: Payer: Self-pay | Admitting: Internal Medicine

## 2011-07-24 ENCOUNTER — Telehealth: Payer: Self-pay | Admitting: *Deleted

## 2011-07-24 ENCOUNTER — Ambulatory Visit (INDEPENDENT_AMBULATORY_CARE_PROVIDER_SITE_OTHER): Payer: Medicare Other | Admitting: Internal Medicine

## 2011-07-24 VITALS — BP 152/81 | HR 59 | Temp 97.0°F | Ht 66.0 in | Wt 219.7 lb

## 2011-07-24 DIAGNOSIS — R5381 Other malaise: Secondary | ICD-10-CM

## 2011-07-24 DIAGNOSIS — R42 Dizziness and giddiness: Secondary | ICD-10-CM

## 2011-07-24 DIAGNOSIS — I509 Heart failure, unspecified: Secondary | ICD-10-CM

## 2011-07-24 DIAGNOSIS — R5383 Other fatigue: Secondary | ICD-10-CM

## 2011-07-24 LAB — COMPREHENSIVE METABOLIC PANEL
AST: 14 U/L (ref 0–37)
Albumin: 4.1 g/dL (ref 3.5–5.2)
Alkaline Phosphatase: 70 U/L (ref 39–117)
BUN: 19 mg/dL (ref 6–23)
Potassium: 4.2 mEq/L (ref 3.5–5.3)
Sodium: 141 mEq/L (ref 135–145)
Total Bilirubin: 0.5 mg/dL (ref 0.3–1.2)

## 2011-07-24 MED ORDER — FUROSEMIDE 20 MG PO TABS: 20.0000 mg | ORAL_TABLET | Freq: Every day | ORAL | Status: DC

## 2011-07-24 NOTE — Telephone Encounter (Signed)
Call from [facility] with [facility] states pt does not feel good, dizzy, fall last week ( no injury), BP 124/88  HR 48 - per pt. Today.  Pt given appointment for 2:15 today for evaluation.

## 2011-07-24 NOTE — Patient Instructions (Signed)
Please, take Furosemide (Lasix) 20 mg once daily. Please, do not walk if you feel light headed. Please, call with any questions.

## 2011-07-24 NOTE — Progress Notes (Signed)
Subjective:  Patient ID: Eugene [name] male DOB: [birth year] 77 y.o. MRN: [id number]  HPI: [name] is a 76 y.oman with a hx of CHF and therapy with Lasix since February, 2011; presents with c/o being "dizzy" for a couple of days. Denies any speech or vision deficits; no tingling; numbness or weakness; Denies HA, SOB, CP or increase in leg swelling.Patient states that he has had a toe nail avulsion  last week and had one episode of a mechanical fall without any trauma or blackout. No any other concerns.   Past Medical History  Diagnosis Date  . Glaucoma     Right eye  . Diverticulosis   . Macular degeneration of right eye   . Allergic rhinitis   . CAD (coronary artery disease)     status post percutaneous transluminal coronary angioplasty in 1991.  . OSA on CPAP   . Hypertension   . Hyperlipidemia   . Chronic CHF (congestive heart failure)     ejection fraction of 40-45%, grade 2 diastolic  dysfunction, and mild aortic stenosis  . Supraventricular tachycardia     2 syndromes-nonsustained atrial tachycardia//adenosine responsive diuretic positive reentry probably AV node reentry   Current Outpatient Prescriptions  Medication Sig Dispense Refill  . amLODipine (NORVASC) 5 MG tablet Take 2 tablets (10 mg total) by mouth daily.  60 tablet  11  . aspirin 81 MG tablet Take 81 mg by mouth daily.        . carvedilol (COREG) 6.25 MG tablet Take 1 tablet (6.25 mg total) by mouth 2 (two) times daily.  60 tablet  11  . docusate sodium (COLACE) 100 MG capsule Take 100 mg by mouth 2 (two) times daily.        . furosemide (LASIX) 40 MG tablet Take 80 mg in the morning, and 40 mg in the evening  90 tablet  5  . furosemide (LASIX) 40 MG tablet TAKE 2 TABLETS (80 MG TOTAL) BY MOUTH DAILY.  60 tablet  2  . losartan (COZAAR) 50 MG tablet Take 1 tablet (50 mg total) by mouth daily.  30 tablet  4  . loteprednol (LOTEMAX) 0.5 % ophthalmic suspension Place 1 drop into the right eye 4 (four) times  daily.        . simvastatin (ZOCOR) 40 MG tablet Take 1 tablet (40 mg total) by mouth at bedtime.  30 tablet  11  . travoprost, benzalkonium, (TRAVATAN) 0.004 % ophthalmic solution Place 1 drop into the right eye at bedtime.         No family history on file. History   Social History  . Marital Status: Widowed    Spouse Name: N/A    Number of Children: N/A  . Years of Education: N/A   Social History Main Topics  . Smoking status: Former Smoker    Types: Cigarettes    Quit date: 10/08/1960  . Smokeless tobacco: None  . Alcohol Use: No  . Drug Use: No  . Sexually Active: None   Other Topics Concern  . None   Social History Narrative  . None   Review of Systems:  Per HPI  Objective: Vitals: reviewed General: alert, well-developed, and cooperative to examination.  Head: normocephalic and atraumatic.  Eyes: vision grossly intact, pupils equal, pupils round, pupils reactive to light, no injection and anicteric.  Mouth: pharynx pink and moist, no erythema, and no exudates.  Neck: supple, full ROM, no thyromegaly, no JVD, and no carotid bruits.  Lungs:  normal respiratory effort, no accessory muscle use, normal breath sounds, no crackles, and no wheezes. Heart: normal rate, regular rhythm, no murmur, no gallop, and no rub.  Abdomen: soft, non-tender, normal bowel sounds, no distention, no guarding, no rebound tenderness, no hepatomegaly, and no splenomegaly.  Msk: no joint swelling, no joint warmth, and no redness over joints.  Pulses: 2+ DP/PT pulses bilaterally Extremities: No cyanosis, clubbing, edema Neurologic: alert & oriented X3, cranial nerves II-XII intact, strength normal in all extremities, sensation intact to light touch, and gait normal.  Skin: turgor normal and no rashes.  Psych: Oriented X3, memory intact for recent and remote, normally interactive, good eye contact, not anxious appearing, and not depressed appearing.   Assessment & Plan: 1. Dizziness with  positive orthostasis.   Likely over diuresed. Daily weights without significant changes for the past 2 weeks. -Will decrease Lasix down to 20 mg Po q m -repeat b-met to evaluate for electrolytes abnormalities -fall precautions. RTC PRN

## 2011-07-24 NOTE — Progress Notes (Deleted)
  Subjective:    Patient ID: Eugene [name], male    DOB: [birth year], 77 y.o.   MRN: [id number]  HPI    Review of Systems     Objective:   Physical Exam        Assessment & Plan:

## 2011-08-20 ENCOUNTER — Ambulatory Visit (INDEPENDENT_AMBULATORY_CARE_PROVIDER_SITE_OTHER): Payer: Medicare Other | Admitting: Internal Medicine

## 2011-08-20 ENCOUNTER — Telehealth: Payer: Self-pay | Admitting: *Deleted

## 2011-08-20 ENCOUNTER — Encounter: Payer: Self-pay | Admitting: Internal Medicine

## 2011-08-20 DIAGNOSIS — I10 Essential (primary) hypertension: Secondary | ICD-10-CM

## 2011-08-20 DIAGNOSIS — I509 Heart failure, unspecified: Secondary | ICD-10-CM

## 2011-08-20 DIAGNOSIS — R5383 Other fatigue: Secondary | ICD-10-CM

## 2011-08-20 DIAGNOSIS — K5730 Diverticulosis of large intestine without perforation or abscess without bleeding: Secondary | ICD-10-CM

## 2011-08-20 DIAGNOSIS — R5381 Other malaise: Secondary | ICD-10-CM

## 2011-08-20 DIAGNOSIS — K5790 Diverticulosis of intestine, part unspecified, without perforation or abscess without bleeding: Secondary | ICD-10-CM

## 2011-08-20 DIAGNOSIS — R42 Dizziness and giddiness: Secondary | ICD-10-CM

## 2011-08-20 LAB — CBC WITH DIFFERENTIAL/PLATELET
Eosinophils Absolute: 0.5 10*3/uL (ref 0.0–0.7)
Eosinophils Relative: 5 % (ref 0–5)
HCT: 45.5 % (ref 39.0–52.0)
Hemoglobin: 14.8 g/dL (ref 13.0–17.0)
Lymphocytes Relative: 18 % (ref 12–46)
Lymphs Abs: 1.8 10*3/uL (ref 0.7–4.0)
MCH: 28.8 pg (ref 26.0–34.0)
MCV: 88.7 fL (ref 78.0–100.0)
Monocytes Absolute: 1.1 10*3/uL — ABNORMAL HIGH (ref 0.1–1.0)
Monocytes Relative: 11 % (ref 3–12)
Neutrophils Relative %: 66 % (ref 43–77)
Platelets: 206 10*3/uL (ref 150–400)
RBC: 5.13 MIL/uL (ref 4.22–5.81)
WBC: 10.1 10*3/uL (ref 4.0–10.5)

## 2011-08-20 NOTE — Telephone Encounter (Signed)
Pt's atty calls and states pt is having falls and states he is dizzy and short of breath a good deal of the time. She is very concerned and also would like a statement from the md concerning his ability to be under prolonged periods of stressful situations, she will send documentation concerning this issue. Per chilonb. He is ask to come to appt at 1315 today w/ dr sidhu

## 2011-08-20 NOTE — Patient Instructions (Signed)
Please follow up in 1 month.  Please continue to take all medications as directed.

## 2011-08-20 NOTE — Assessment & Plan Note (Signed)
Clinically stable with no signs of fluid overload despite having his Lasix decreased. Furthermore patient has been checking his weight and notes no increase in weight. We'll continue this current dose of Lasix and have patient follow up in one month.

## 2011-08-20 NOTE — Progress Notes (Signed)
  Subjective:    Patient ID: Eugene [name], male    DOB: 1924-01-31, 77 y.o.   MRN: [id number]  HPI Eugene [name] is an 77 year old man with past medical history significant for hyperlipidemia, hypertension, congestive heart failure, supra-from tracheal or tachycardia, coronary artery disease and diverticulosis who presents today for followup as well as having a recent fall.   Patient brings a letter from his lawyers that described a recent fall. Patient states that he was at the senior event when he was not able to get his cane and subsequently fell. Patient denied feeling dizzy or lightheaded prior to the fall and considers the fall to be secondary to not having his cane by his side.   The letter also indicates that patient has been feeling lethargic as well as dizzy at times. Patient has been evaluated for dizziness several times in the past. In the past it has been attributed to bradycardia and his beta blocker dose was decreased. And recent history patient was considered orthostatic and thus his diuretic was decreased and now patient is only on 20 mg of furosemide. Patient denies feeling short of breath or having chest pain. Although patient does indicate that there are times when he feels like he has to take deep breaths. Patient denies any cough or feeling short of breath at night or using increase number of pillows to sleep. No lower extremity swelling that he has noticed. He checks his blood pressure at home and notes it to be between 120s to 150s. His pulse rate usually ranges between 60-80 although he does mention that there are times when his heart rate will get up to the 130s and will stay in that range for a couple of hours. She states that it we'll go back down to 60 or 70 on its own.   Review of Systems  All other systems reviewed and are negative.       Objective:   Physical Exam  Constitutional: He is oriented to person, place, and time. He appears well-developed.  HENT:  Head:  Normocephalic.  Eyes: Pupils are equal, round, and reactive to light.  Neck: Normal range of motion. No JVD present.  Cardiovascular: Normal rate.  Exam reveals no gallop and no friction rub.   No murmur heard. Pulmonary/Chest: Effort normal and breath sounds normal. He has no rales.       No crackles  Abdominal: Soft. Bowel sounds are normal. He exhibits no distension.  Musculoskeletal:       Slight bilateral trace edema unchanged from prior exam findings  Neurological: He is alert and oriented to person, place, and time.       She is slightly slow but normal and not ataxic Patient walks with the assistance of cane Get up and go test normal  Psychiatric: He has a normal mood and affect.          Assessment & Plan:

## 2011-08-20 NOTE — Assessment & Plan Note (Signed)
Lab Results  Component Value Date   NA 141 07/24/2011   K 4.2 07/24/2011   CL 102 07/24/2011   CO2 25 07/24/2011   BUN 19 07/24/2011   CREATININE 0.90 07/24/2011   CREATININE 0.88 04/13/2011    BP Readings from Last 3 Encounters:  08/20/11 122/68  07/24/11 152/81  07/13/11 149/68    Assessment: Hypertension control:  controlled  Progress toward goals:  at goal Barriers to meeting goals:  no barriers identified  Plan: Hypertension treatment:  continue current medications Slightly orthostatic from lying to sitting however not with sitting to standing, will not make any further changes to therapy as patient checks his blood pressure at home and ranges from 130 to 150s systolic.

## 2011-08-20 NOTE — Assessment & Plan Note (Addendum)
Exact etiology unknown. Initially it was thought to be medication induced bradycardia as patient was on a beta blocker which caused his heart rate to fall below 50 causing dizziness. Earlier in October he was considered to be overdiuresed with Lasix which might have contributed to the dizziness as he was orthostatic. At that time the furosemide was decreased in dosing. Patient is clinically dry with no symptoms of shortness of breath, cough or lower extremity swelling. Patient was advised to not drive as his symptoms of dizziness are intermittent and can happen at any time. Furthermore patient was instructed to stand up slowly. Orthostatic vitals were checked today and she was slightly orthostatic from lying to sitting however from sitting to standing was completely within normal limits. Will not make any changes to hypertensive medications at this time. We'll check a CBC to make sure that the patient is not anemic given his history of diverticulosis and bleeding. A letter was provided to his lawyers indicating that patient is advised to avoid situations/testimonial where he can panic causing worsening of shortness of breath and dizziness.

## 2011-10-31 ENCOUNTER — Encounter (HOSPITAL_COMMUNITY): Payer: Self-pay | Admitting: Emergency Medicine

## 2011-10-31 ENCOUNTER — Emergency Department (HOSPITAL_COMMUNITY): Payer: Medicare Other

## 2011-10-31 ENCOUNTER — Inpatient Hospital Stay (HOSPITAL_COMMUNITY)
Admission: EM | Admit: 2011-10-31 | Discharge: 2011-11-16 | DRG: 248 | Disposition: A | Discharge status: Skilled Nursing Facility | Payer: Medicare Other | Source: Ambulatory Visit | Attending: Internal Medicine | Admitting: Internal Medicine

## 2011-10-31 DIAGNOSIS — Z9861 Coronary angioplasty status: Secondary | ICD-10-CM

## 2011-10-31 DIAGNOSIS — I4729 Other ventricular tachycardia: Secondary | ICD-10-CM | POA: Diagnosis not present

## 2011-10-31 DIAGNOSIS — I472 Ventricular tachycardia, unspecified: Secondary | ICD-10-CM | POA: Diagnosis not present

## 2011-10-31 DIAGNOSIS — B9789 Other viral agents as the cause of diseases classified elsewhere: Secondary | ICD-10-CM | POA: Diagnosis not present

## 2011-10-31 DIAGNOSIS — Z7982 Long term (current) use of aspirin: Secondary | ICD-10-CM

## 2011-10-31 DIAGNOSIS — Z87891 Personal history of nicotine dependence: Secondary | ICD-10-CM

## 2011-10-31 DIAGNOSIS — I350 Nonrheumatic aortic (valve) stenosis: Secondary | ICD-10-CM

## 2011-10-31 DIAGNOSIS — I10 Essential (primary) hypertension: Secondary | ICD-10-CM

## 2011-10-31 DIAGNOSIS — S0003XA Contusion of scalp, initial encounter: Secondary | ICD-10-CM

## 2011-10-31 DIAGNOSIS — I471 Supraventricular tachycardia, unspecified: Secondary | ICD-10-CM

## 2011-10-31 DIAGNOSIS — S060X1A Concussion with loss of consciousness of 30 minutes or less, initial encounter: Secondary | ICD-10-CM | POA: Diagnosis present

## 2011-10-31 DIAGNOSIS — I5022 Chronic systolic (congestive) heart failure: Secondary | ICD-10-CM | POA: Diagnosis present

## 2011-10-31 DIAGNOSIS — R55 Syncope and collapse: Secondary | ICD-10-CM

## 2011-10-31 DIAGNOSIS — R001 Bradycardia, unspecified: Secondary | ICD-10-CM

## 2011-10-31 DIAGNOSIS — W108XXA Fall (on) (from) other stairs and steps, initial encounter: Secondary | ICD-10-CM | POA: Diagnosis present

## 2011-10-31 DIAGNOSIS — Z79899 Other long term (current) drug therapy: Secondary | ICD-10-CM

## 2011-10-31 DIAGNOSIS — H3530 Unspecified macular degeneration: Secondary | ICD-10-CM | POA: Diagnosis present

## 2011-10-31 DIAGNOSIS — I509 Heart failure, unspecified: Secondary | ICD-10-CM

## 2011-10-31 DIAGNOSIS — I359 Nonrheumatic aortic valve disorder, unspecified: Principal | ICD-10-CM

## 2011-10-31 DIAGNOSIS — R42 Dizziness and giddiness: Secondary | ICD-10-CM

## 2011-10-31 DIAGNOSIS — W19XXXA Unspecified fall, initial encounter: Secondary | ICD-10-CM

## 2011-10-31 DIAGNOSIS — Y92009 Unspecified place in unspecified non-institutional (private) residence as the place of occurrence of the external cause: Secondary | ICD-10-CM

## 2011-10-31 DIAGNOSIS — Z947 Corneal transplant status: Secondary | ICD-10-CM

## 2011-10-31 DIAGNOSIS — I252 Old myocardial infarction: Secondary | ICD-10-CM

## 2011-10-31 DIAGNOSIS — I2510 Atherosclerotic heart disease of native coronary artery without angina pectoris: Secondary | ICD-10-CM

## 2011-10-31 DIAGNOSIS — I498 Other specified cardiac arrhythmias: Secondary | ICD-10-CM | POA: Diagnosis present

## 2011-10-31 DIAGNOSIS — I5023 Acute on chronic systolic (congestive) heart failure: Secondary | ICD-10-CM | POA: Diagnosis present

## 2011-10-31 DIAGNOSIS — I2589 Other forms of chronic ischemic heart disease: Secondary | ICD-10-CM | POA: Diagnosis present

## 2011-10-31 DIAGNOSIS — E876 Hypokalemia: Secondary | ICD-10-CM | POA: Diagnosis not present

## 2011-10-31 DIAGNOSIS — E785 Hyperlipidemia, unspecified: Secondary | ICD-10-CM

## 2011-10-31 DIAGNOSIS — K219 Gastro-esophageal reflux disease without esophagitis: Secondary | ICD-10-CM | POA: Diagnosis not present

## 2011-10-31 DIAGNOSIS — R5381 Other malaise: Secondary | ICD-10-CM | POA: Diagnosis not present

## 2011-10-31 DIAGNOSIS — G4733 Obstructive sleep apnea (adult) (pediatric): Secondary | ICD-10-CM | POA: Diagnosis present

## 2011-10-31 HISTORY — PX: CORONARY ANGIOPLASTY WITH STENT PLACEMENT: SHX49

## 2011-10-31 LAB — DIFFERENTIAL
Eosinophils Absolute: 0.1 10*3/uL (ref 0.0–0.7)
Eosinophils Relative: 1 % (ref 0–5)
Lymphs Abs: 1.2 10*3/uL (ref 0.7–4.0)
Monocytes Relative: 11 % (ref 3–12)

## 2011-10-31 LAB — URINALYSIS, ROUTINE W REFLEX MICROSCOPIC
Leukocytes, UA: NEGATIVE
Nitrite: NEGATIVE
Specific Gravity, Urine: 1.016 (ref 1.005–1.030)
pH: 7 (ref 5.0–8.0)

## 2011-10-31 LAB — COMPREHENSIVE METABOLIC PANEL
BUN: 14 mg/dL (ref 6–23)
Calcium: 9.5 mg/dL (ref 8.4–10.5)
GFR calc Af Amer: >90 mL/min (ref >90)
Glucose, Bld: 117 mg/dL — ABNORMAL HIGH (ref 70–99)
Total Protein: 7.3 g/dL (ref 6.0–8.3)

## 2011-10-31 LAB — URINE MICROSCOPIC-ADD ON

## 2011-10-31 LAB — CARDIAC PANEL(CRET KIN+CKTOT+MB+TROPI)
CK, MB: 4.1 ng/mL — ABNORMAL HIGH (ref 0.3–4.0)
Total CK: 102 U/L (ref 7–232)

## 2011-10-31 LAB — CBC
Hemoglobin: 16.7 g/dL (ref 13.0–17.0)
MCH: 30.5 pg (ref 26.0–34.0)
MCHC: 34.7 g/dL (ref 30.0–36.0)
MCV: 87.9 fL (ref 78.0–100.0)
Platelets: 177 10*3/uL (ref 150–400)
RBC: 5.47 MIL/uL (ref 4.22–5.81)

## 2011-10-31 LAB — PROTIME-INR: INR: 1.01 (ref 0.00–1.49)

## 2011-10-31 MED ORDER — ASPIRIN EC 81 MG PO TBEC
81.0000 mg | DELAYED_RELEASE_TABLET | Freq: Every day | ORAL | Status: DC
  Administered 2011-11-01 – 2011-11-11 (×11): 81 mg via ORAL
  Filled 2011-10-31 (×11): qty 1

## 2011-10-31 MED ORDER — IPRATROPIUM BROMIDE 0.02 % IN SOLN
0.5000 mg | Freq: Once | RESPIRATORY_TRACT | Status: AC
  Administered 2011-10-31: 0.5 mg via RESPIRATORY_TRACT

## 2011-10-31 MED ORDER — LOSARTAN POTASSIUM 50 MG PO TABS
50.0000 mg | ORAL_TABLET | Freq: Every day | ORAL | Status: DC
  Administered 2011-11-01 – 2011-11-16 (×15): 50 mg via ORAL
  Filled 2011-10-31 (×16): qty 1

## 2011-10-31 MED ORDER — ALBUTEROL SULFATE (5 MG/ML) 0.5% IN NEBU
INHALATION_SOLUTION | RESPIRATORY_TRACT | Status: AC
  Administered 2011-10-31: 16:00:00
  Filled 2011-10-31: qty 1

## 2011-10-31 MED ORDER — AMLODIPINE BESYLATE 10 MG PO TABS
10.0000 mg | ORAL_TABLET | Freq: Every day | ORAL | Status: DC
  Administered 2011-11-01 – 2011-11-06 (×6): 10 mg via ORAL
  Filled 2011-10-31 (×8): qty 1

## 2011-10-31 MED ORDER — SODIUM CHLORIDE 0.9 % IV SOLN
INTRAVENOUS | Status: DC
  Administered 2011-10-31 – 2011-11-01 (×2): via INTRAVENOUS

## 2011-10-31 MED ORDER — CARVEDILOL 6.25 MG PO TABS
6.2500 mg | ORAL_TABLET | Freq: Two times a day (BID) | ORAL | Status: DC
  Administered 2011-11-01 (×2): 6.25 mg via ORAL
  Filled 2011-10-31 (×3): qty 1

## 2011-10-31 MED ORDER — ASPIRIN 81 MG PO TABS: 81.0000 mg | ORAL_TABLET | Freq: Every day | ORAL | Status: DC

## 2011-10-31 MED ORDER — ALBUTEROL SULFATE (5 MG/ML) 0.5% IN NEBU
INHALATION_SOLUTION | RESPIRATORY_TRACT | Status: AC
  Administered 2011-10-31: 2.5 mg
  Filled 2011-10-31: qty 0.5

## 2011-10-31 MED ORDER — DILTIAZEM HCL 100 MG IV SOLR
5.0000 mg/h | INTRAVENOUS | Status: AC
  Administered 2011-10-31: 5 mg/h via INTRAVENOUS
  Filled 2011-10-31: qty 100

## 2011-10-31 MED ORDER — DILTIAZEM HCL 100 MG IV SOLR
5.0000 mg/h | INTRAVENOUS | Status: DC
  Administered 2011-10-31 – 2011-11-01 (×2): 5 mg/h via INTRAVENOUS

## 2011-10-31 MED ORDER — NITROGLYCERIN 2 % TD OINT
1.0000 [in_us] | TOPICAL_OINTMENT | Freq: Once | TRANSDERMAL | Status: AC
  Administered 2011-10-31: 1 [in_us] via TOPICAL
  Filled 2011-10-31: qty 1

## 2011-10-31 MED ORDER — IPRATROPIUM BROMIDE 0.02 % IN SOLN
RESPIRATORY_TRACT | Status: AC
  Administered 2011-10-31: 0.5 mg via RESPIRATORY_TRACT
  Filled 2011-10-31: qty 2.5

## 2011-10-31 MED ORDER — DOCUSATE SODIUM 100 MG PO CAPS
100.0000 mg | ORAL_CAPSULE | Freq: Two times a day (BID) | ORAL | Status: DC
  Administered 2011-11-01 – 2011-11-16 (×31): 100 mg via ORAL
  Filled 2011-10-31 (×35): qty 1

## 2011-10-31 MED ORDER — ENOXAPARIN SODIUM 40 MG/0.4ML ~~LOC~~ SOLN
40.0000 mg | Freq: Every day | SUBCUTANEOUS | Status: DC
  Administered 2011-11-01 – 2011-11-05 (×6): 40 mg via SUBCUTANEOUS
  Filled 2011-10-31 (×7): qty 0.4

## 2011-10-31 MED ORDER — SODIUM CHLORIDE 0.9 % IJ SOLN
3.0000 mL | Freq: Two times a day (BID) | INTRAMUSCULAR | Status: DC
  Administered 2011-11-01 – 2011-11-02 (×3): 3 mL via INTRAVENOUS

## 2011-10-31 MED ORDER — FUROSEMIDE 10 MG/ML IJ SOLN
40.0000 mg | Freq: Two times a day (BID) | INTRAMUSCULAR | Status: DC
  Administered 2011-11-01 – 2011-11-03 (×4): 40 mg via INTRAVENOUS
  Filled 2011-10-31 (×7): qty 4

## 2011-10-31 MED ORDER — SODIUM CHLORIDE 0.9 % IJ SOLN: 3.0000 mL | INTRAMUSCULAR | Status: DC | PRN

## 2011-10-31 MED ORDER — FUROSEMIDE 10 MG/ML IJ SOLN
80.0000 mg | Freq: Once | INTRAMUSCULAR | Status: AC
  Administered 2011-10-31: 80 mg via INTRAVENOUS
  Filled 2011-10-31: qty 8

## 2011-10-31 MED ORDER — LOTEPREDNOL ETABONATE 0.5 % OP SUSP
1.0000 [drp] | Freq: Four times a day (QID) | OPHTHALMIC | Status: DC
  Administered 2011-11-01 – 2011-11-10 (×34): 1 [drp] via OPHTHALMIC
  Filled 2011-10-31 (×3): qty 5

## 2011-10-31 MED ORDER — IPRATROPIUM BROMIDE 0.02 % IN SOLN
0.5000 mg | Freq: Once | RESPIRATORY_TRACT | Status: DC
  Filled 2011-10-31: qty 2.5

## 2011-10-31 MED ORDER — SIMVASTATIN 40 MG PO TABS
40.0000 mg | ORAL_TABLET | Freq: Every day | ORAL | Status: DC
  Administered 2011-11-01 (×2): 40 mg via ORAL
  Filled 2011-10-31 (×3): qty 1

## 2011-10-31 MED ORDER — ALBUTEROL SULFATE (5 MG/ML) 0.5% IN NEBU: 2.5000 mg | INHALATION_SOLUTION | Freq: Once | RESPIRATORY_TRACT | Status: DC

## 2011-10-31 MED ORDER — ALBUTEROL SULFATE (5 MG/ML) 0.5% IN NEBU
2.5000 mg | INHALATION_SOLUTION | Freq: Once | RESPIRATORY_TRACT | Status: DC
  Filled 2011-10-31: qty 0.5

## 2011-10-31 MED ORDER — ONDANSETRON HCL 4 MG/2ML IJ SOLN
INTRAMUSCULAR | Status: AC
  Administered 2011-10-31: 17:00:00
  Filled 2011-10-31: qty 2

## 2011-10-31 MED ORDER — SODIUM CHLORIDE 0.9 % IV SOLN: 250.0000 mL | INTRAVENOUS | Status: DC | PRN

## 2011-10-31 MED ORDER — TRAVOPROST (BAK FREE) 0.004 % OP SOLN
1.0000 [drp] | Freq: Every day | OPHTHALMIC | Status: DC
  Administered 2011-10-31 – 2011-11-15 (×17): 1 [drp] via OPHTHALMIC
  Filled 2011-10-31 (×3): qty 2.5

## 2011-10-31 NOTE — ED Notes (Signed)
Called resp therapy for breathing treatment.

## 2011-10-31 NOTE — ED Notes (Addendum)
Per EMS pt fell going up his front steps. Small lac on forehead from glasses and a small lac on his right elbow.  Pt no memory of fall, history of afib.  BP on scene 137/93.  SPO2 on room air 83/84 RA.  EMS gave breathing treatment after SPO2 90/RA.  Pt takes lasix.  Pt given 4mg  zofran by EMS.  He vomited on scene and was turned on side while on back board

## 2011-10-31 NOTE — ED Notes (Addendum)
Per ems pt was incontinent of urine on scene.  Pt claims this is new.  Pt on backboard and blocks

## 2011-10-31 NOTE — ED Notes (Signed)
Attempted to call report to [facility] RN [phone] unable to take at this time

## 2011-10-31 NOTE — ED Provider Notes (Cosign Needed Addendum)
History     CSN: [id number]  Arrival date & time 10/31/11  1514   First MD Initiated Contact with Patient 10/31/11 1518      Chief Complaint  Patient presents with  . Fall    (Consider location/radiation/quality/duration/timing/severity/associated sxs/prior treatment) Patient is a 78 y.o. male presenting with fall. The history is provided by the patient and medical records. No language interpreter was used.  Fall The accident occurred less than 1 hour ago. Incident: While climbing stairs. He fell from a height of 1 to 2 ft. Impact surface: He fell on his front steps. He has no recall of the incident. The point of impact was the head. The patient is experiencing no pain. There was no entrapment after the fall. There was no drug use involved in the accident. There was no alcohol use involved in the accident. Pertinent negatives include no fever. Exacerbated by: He has had prior falls, but none recently.  He has ongoing problems with balance. Treatment on scene includes a c-collar and a backboard. Treatments tried: He was transported to [facility] ED via EMS, boarded and collared. The treatment provided no relief.    Past Medical History  Diagnosis Date  . Glaucoma     Right eye  . Diverticulosis   . Macular degeneration of right eye   . Allergic rhinitis   . CAD (coronary artery disease)     status post percutaneous transluminal coronary angioplasty in 1991.  . OSA on CPAP   . Hypertension   . Hyperlipidemia   . Chronic CHF (congestive heart failure)     ejection fraction of 40-45%, grade 2 diastolic  dysfunction, and mild aortic stenosis  . Supraventricular tachycardia     2 syndromes-nonsustained atrial tachycardia//adenosine responsive diuretic positive reentry probably AV node reentry    Past Surgical History  Procedure Date  . Implantation of baerveldt glaucoma device lant with scleral reinforcement using tutoplast tissue graft right eye.   . Coronary angioplasty with  stent placement     History reviewed. No pertinent family history.  History  Substance Use Topics  . Smoking status: Former Smoker    Types: Cigarettes    Quit date: 10/08/1960  . Smokeless tobacco: Not on file  . Alcohol Use: No      Review of Systems  Constitutional: Negative.  Negative for fever and chills.  HENT:       Superficial abrasion between the eyebrows.  Eyes: Negative.   Respiratory: Negative.   Cardiovascular: Negative.  Negative for chest pain.  Gastrointestinal: Negative.   Genitourinary: Negative.   Musculoskeletal: Negative.   Skin: Negative.   Neurological: Positive for dizziness.  Psychiatric/Behavioral: Negative.     Allergies  Penicillins  Home Medications   Current Outpatient Rx  Name Route Sig Dispense Refill  . AMLODIPINE BESYLATE 5 MG PO TABS Oral Take 10 mg by mouth daily.    . ASPIRIN 81 MG PO TABS Oral Take 81 mg by mouth daily.      Marland Kitchen CARVEDILOL 6.25 MG PO TABS Oral Take 6.25 mg by mouth 2 (two) times daily.    Marland Kitchen DOCUSATE SODIUM 100 MG PO CAPS Oral Take 100 mg by mouth 2 (two) times daily.      . FUROSEMIDE 20 MG PO TABS Oral Take 20 mg by mouth daily.    . FUROSEMIDE 40 MG PO TABS Oral Take 40-80 mg by mouth 2 (two) times daily. Take 2 tablets (80 MG) in the morning, and 1 tablet (40  MG) in the evening.    Marland Kitchen LOSARTAN POTASSIUM 50 MG PO TABS Oral Take 50 mg by mouth daily.    Marland Kitchen LOTEPREDNOL ETABONATE 0.5 % OP SUSP Right Eye Place 1 drop into the right eye 4 (four) times daily.      Marland Kitchen SIMVASTATIN 40 MG PO TABS Oral Take 40 mg by mouth at bedtime.    . TRAVOPROST 0.004 % OP SOLN Right Eye Place 1 drop into the right eye at bedtime.        There were no vitals taken for this visit.  Physical Exam  ED Course  CRITICAL CARE Performed by: Osvaldo Human Authorized by: Osvaldo Human Total critical care time: 60 minutes Critical care was necessary to treat or prevent imminent or life-threatening deterioration of the following  conditions: trauma and cardiac failure (Workup for head injury and congestive heart failure, with intermittent tachycardia, admitting to stepdown unit.). Critical care was time spent personally by me on the following activities: development of treatment plan with patient or surrogate, discussions with consultants, evaluation of patient's response to treatment, examination of patient, obtaining history from patient or surrogate, ordering and review of laboratory studies, ordering and review of radiographic studies and re-evaluation of patient's condition.   (including critical care time)   Labs Reviewed  CBC  DIFFERENTIAL  COMPREHENSIVE METABOLIC PANEL  URINALYSIS, ROUTINE W REFLEX MICROSCOPIC  CK  CARDIAC PANEL(CRET KIN+CKTOT+MB+TROPI)  PROTIME-INR  APTT  PRO B NATRIURETIC PEPTIDE   3:52 PM  Date: 10/31/2011  Tracing done at 15:30 HOURS  Rate: 93  Rhythm: normal sinus rhythm  QRS Axis: left  Intervals: PR prolonged QRS:  Left ventricular hypertrophy  ST/T Wave abnormalities: Inverted T waves in V6 probablyy the result of LVH  Conduction Disutrbances:first-degree A-V block  and nonspecific intraventricular conduction delay  Narrative Interpretation: Abnormal EKG  Old EKG Reviewed: unchanged  3:57 PM Tracing at 15:48 P.M.  Date: 10/31/2011  Rate: 144  Rhythm: sinus tachycardia  QRS Axis: left  Intervals: normal QRS:  Left ventricular hypertrophy  ST/T Wave abnormalities: ST depression and T wave inversion, probably result of repolarization abnormality from left ventricular hypertrophy.  Conduction Disutrbances:nonspecific intraventricular conduction delay  Narrative Interpretation: Abnormal EKG  Old EKG Reviewed: changes noted--rate more rapid on this tracing.  4:03 PM Had brief episode of sinus tach about 140; now resolved.  Getting albuterol and atrovent neb Rx.   6:59 PM CT of head is negative for brain injury, has contusion of the right occipital region, and on  forehead.  Lab workup shows cardiomegaly and vascular congestion on Chest x-ray, and BNP is 1475, indicating congestive heart failure.  Will treat with IV Lasix and topical NTG, request admission with [facility].   1. Fall   2. Contusion of scalp   3. Congestive heart failure      Dr. Houston Siren has seen pt and advises pt to be admitted to a stepdown unit, Team 4, Dr. Arthor Captain attending.  He advised placing pt on a Cardizem infusion at 5 mg per hour.     Carleene Cooper III, MD 10/31/11 1903  Carleene Cooper III, MD 10/31/11 1942  Carleene Cooper III, MD 10/31/11 1944

## 2011-10-31 NOTE — H&P (Signed)
PCP:   Melida Quitter, MD, MD   Chief Complaint: Syncope   HPI: Eugene [name] is an 78 y.o. male with multiple medical problems including known coronary disease, congestive heart failure on diastolic basis, mild aortic stenosis, supraventricular tachycardia, obstructive sleep apnea on CPAP brought into the emergency room as he had loss of consciousness. He did not recall the event. There has been no reported seizure activity, loss of bowel or bladder function, or post ictal confusion. He denied any chest pain or shortness of breath. He has no fever, chills, abdominal cramps or pain, black stool or bloody stool. Evaluation in the emergency room included an EKG which shows SVT, negative cardiac markers, normal renal functions, and chest x-ray showed vascular congestion with elevated BNP of 1400. In the emergency room he has persistent tachycardia with a heart rate of 140, blood pressure of 110, and is rather asymptomatic. Hospitalist was asked to admit patient for further evaluation and treatment.  Rewiew of Systems:  The patient denies anorexia, fever, weight loss,, vision loss, decreased hearing, hoarseness, chest pain, balance deficits, hemoptysis, abdominal pain, melena, hematochezia, severe indigestion/heartburn, hematuria, incontinence, genital sores, muscle weakness, suspicious skin lesions, transient blindness, difficulty walking, depression, unusual weight change, abnormal bleeding, enlarged lymph nodes, angioedema, and breast masses.    Past Medical History  Diagnosis Date  . Glaucoma     Right eye  . Diverticulosis   . Macular degeneration of right eye   . Allergic rhinitis   . CAD (coronary artery disease)     status post percutaneous transluminal coronary angioplasty in 1991.  . OSA on CPAP   . Hypertension   . Hyperlipidemia   . Chronic CHF (congestive heart failure)     ejection fraction of 40-45%, grade 2 diastolic  dysfunction, and mild aortic stenosis  . Supraventricular  tachycardia     2 syndromes-nonsustained atrial tachycardia//adenosine responsive diuretic positive reentry probably AV node reentry    Past Surgical History  Procedure Date  . Implantation of baerveldt glaucoma device lant with scleral reinforcement using tutoplast tissue graft right eye.   . Coronary angioplasty with stent placement   . Corneal transplant     Medications:  HOME MEDS: Prior to Admission medications   Medication Sig Start Date End Date Taking? Authorizing Provider  amLODipine (NORVASC) 5 MG tablet Take 10 mg by mouth daily. 07/10/11 07/09/12 Yes Deatra Robinson, MD  aspirin 81 MG tablet Take 81 mg by mouth daily.     Yes Historical Provider, MD  carvedilol (COREG) 6.25 MG tablet Take 6.25 mg by mouth 2 (two) times daily. 05/03/11 05/02/12 Yes Amanjot Sidhu, MD  docusate sodium (COLACE) 100 MG capsule Take 100 mg by mouth 2 (two) times daily.     Yes Historical Provider, MD  furosemide (LASIX) 20 MG tablet Take 20 mg by mouth daily. 07/24/11 07/23/12 Yes Deatra Robinson, MD  furosemide (LASIX) 40 MG tablet Take 40-80 mg by mouth 2 (two) times daily. Take 2 tablets (80 MG) in the morning, and 1 tablet (40 MG) in the evening.   Yes Historical Provider, MD  losartan (COZAAR) 50 MG tablet Take 50 mg by mouth daily. 07/10/11  Yes Deatra Robinson, MD  loteprednol (LOTEMAX) 0.5 % ophthalmic suspension Place 1 drop into the right eye 4 (four) times daily.     Yes Historical Provider, MD  simvastatin (ZOCOR) 40 MG tablet Take 40 mg by mouth at bedtime. 05/17/11  Yes Rocco Serene, MD  travoprost, benzalkonium, (TRAVATAN) 0.004 % ophthalmic  solution Place 1 drop into the right eye at bedtime.     Yes Historical Provider, MD     Allergies:  Allergies  Allergen Reactions  . Penicillins     REACTION: "turns red"    Social History:   reports that he quit smoking about 51 years ago. His smoking use included Cigarettes. He does not have any smokeless tobacco history on file. He  reports that he does not drink alcohol or use illicit drugs.  Family History: History reviewed. No pertinent family history.   Physical Exam: Filed Vitals:   10/31/11 1558 10/31/11 1719 10/31/11 1859 10/31/11 1931  BP:  108/85 113/83   Pulse:  146 142   Temp:      TempSrc:      Resp:   25   SpO2: 90% 86% 90% 89%   Blood pressure 113/83, pulse 142, temperature 98.7 F (37.1 C), temperature source Oral, resp. rate 25, SpO2 89.00%.  GEN:  Pleasant  person lying in the stretcher in no acute distress; cooperative with exam PSYCH:  alert and oriented x4; does not appear anxious does not appear depressed; affect is normal HEENT: Mucous membranes pink and anicteric; PERRLA; EOM intact; no cervical lymphadenopathy nor thyromegaly or carotid bruit; no JVD; there is a small hematoma on his forehead Breasts:: Not examined CHEST WALL: No tenderness CHEST: Normal respiration, clear to auscultation bilaterally HEART: Regular rate tachycardic; no murmurs rubs or gallops BACK: No kyphosis or scoliosis; no CVA tenderness ABDOMEN: Obese, soft non-tender; no masses, no organomegaly, normal abdominal bowel sounds; no pannus; no intertriginous candida. Rectal Exam: Not done EXTREMITIES: No bone or joint deformity; age-appropriate arthropathy of the hands and knees; there is 2+ edema bilaterally; no ulcerations. Genitalia: not examined PULSES: 2+ and symmetric SKIN: Normal hydration no rash or ulceration CNS: Cranial nerves 2-12 grossly intact no focal neurologic deficit   Labs & Imaging Results for orders placed during the hospital encounter of 10/31/11 (from the past 48 hour(s))  CARDIAC PANEL(CRET KIN+CKTOT+MB+TROPI)     Status: Abnormal   Collection Time   10/31/11  3:32 PM      Component Value Range Comment   Total CK 102  7 - 232 (U/L)    CK, MB 4.1 (*) 0.3 - 4.0 (ng/mL)    Troponin I <0.30  <0.30 (ng/mL)    Relative Index 4.0 (*) 0.0 - 2.5    CBC     Status: Abnormal   Collection Time    10/31/11  3:49 PM      Component Value Range Comment   WBC 11.8 (*) 4.0 - 10.5 (K/uL)    RBC 5.47  4.22 - 5.81 (MIL/uL)    Hemoglobin 16.7  13.0 - 17.0 (g/dL)    HCT 16.1  09.6 - 04.5 (%)    MCV 87.9  78.0 - 100.0 (fL)    MCH 30.5  26.0 - 34.0 (pg)    MCHC 34.7  30.0 - 36.0 (g/dL)    RDW 40.9  81.1 - 91.4 (%)    Platelets 177  150 - 400 (K/uL)   DIFFERENTIAL     Status: Abnormal   Collection Time   10/31/11  3:49 PM      Component Value Range Comment   Neutrophils Relative 78 (*) 43 - 77 (%)    Neutro Abs 9.3 (*) 1.7 - 7.7 (K/uL)    Lymphocytes Relative 10 (*) 12 - 46 (%)    Lymphs Abs 1.2  0.7 - 4.0 (K/uL)  Monocytes Relative 11  3 - 12 (%)    Monocytes Absolute 1.3 (*) 0.1 - 1.0 (K/uL)    Eosinophils Relative 1  0 - 5 (%)    Eosinophils Absolute 0.1  0.0 - 0.7 (K/uL)    Basophils Relative 0  0 - 1 (%)    Basophils Absolute 0.0  0.0 - 0.1 (K/uL)   COMPREHENSIVE METABOLIC PANEL     Status: Abnormal   Collection Time   10/31/11  3:49 PM      Component Value Range Comment   Sodium 137  135 - 145 (mEq/L)    Potassium 3.6  3.5 - 5.1 (mEq/L)    Chloride 100  96 - 112 (mEq/L)    CO2 24  19 - 32 (mEq/L)    Glucose, Bld 117 (*) 70 - 99 (mg/dL)    BUN 14  6 - 23 (mg/dL)    Creatinine, Ser 4.54  0.50 - 1.35 (mg/dL)    Calcium 9.5  8.4 - 10.5 (mg/dL)    Total Protein 7.3  6.0 - 8.3 (g/dL)    Albumin 3.7  3.5 - 5.2 (g/dL)    AST 18  0 - 37 (U/L)    ALT 16  0 - 53 (U/L)    Alkaline Phosphatase 80  39 - 117 (U/L)    Total Bilirubin 0.6  0.3 - 1.2 (mg/dL)    GFR calc non Af Amer 84 (*) >90 (mL/min)    GFR calc Af Amer >90  >90 (mL/min)   CK     Status: Normal   Collection Time   10/31/11  3:49 PM      Component Value Range Comment   Total CK 101  7 - 232 (U/L)   PROTIME-INR     Status: Normal   Collection Time   10/31/11  3:49 PM      Component Value Range Comment   Prothrombin Time 13.5  11.6 - 15.2 (seconds)    INR 1.01  0.00 - 1.49    APTT     Status: Normal   Collection  Time   10/31/11  3:49 PM      Component Value Range Comment   aPTT 30  24 - 37 (seconds)   PRO B NATRIURETIC PEPTIDE     Status: Abnormal   Collection Time   10/31/11  4:52 PM      Component Value Range Comment   Pro B Natriuretic peptide (BNP) 1475.0 (*) 0 - 450 (pg/mL)   URINALYSIS, ROUTINE W REFLEX MICROSCOPIC     Status: Abnormal   Collection Time   10/31/11  6:33 PM      Component Value Range Comment   Color, Urine YELLOW  YELLOW     APPearance CLEAR  CLEAR     Specific Gravity, Urine 1.016  1.005 - 1.030     pH 7.0  5.0 - 8.0     Glucose, UA NEGATIVE  NEGATIVE (mg/dL)    Hgb urine dipstick NEGATIVE  NEGATIVE     Bilirubin Urine NEGATIVE  NEGATIVE     Ketones, ur NEGATIVE  NEGATIVE (mg/dL)    Protein, ur >098 (*) NEGATIVE (mg/dL)    Urobilinogen, UA 1.0  0.0 - 1.0 (mg/dL)    Nitrite NEGATIVE  NEGATIVE     Leukocytes, UA NEGATIVE  NEGATIVE    URINE MICROSCOPIC-ADD ON     Status: Abnormal   Collection Time   10/31/11  6:33 PM      Component Value  Range Comment   Squamous Epithelial / LPF FEW (*) RARE     WBC, UA 0-2  <3 (WBC/hpf)    RBC / HPF 0-2  <3 (RBC/hpf)    Bacteria, UA RARE  RARE     Dg Chest 2 View  10/31/2011  *RADIOLOGY REPORT*  Clinical Data: Status post fall with a blow to the back of the head.  CHEST - 2 VIEW  Comparison: Plain films of the chest 04/13/2011.  Findings: There is cardiomegaly with some pulmonary vascular congestion.  No focal airspace disease or pleural effusion.  No pneumothorax.  Remote right humerus fracture noted.  IMPRESSION: Cardiomegaly and vascular congestion without acute disease.  Original Report Authenticated By: Bernadene Bell. Maricela Curet, M.D.   Ct Head Wo Contrast  10/31/2011  *RADIOLOGY REPORT*  Clinical Data: Fall going up steps.  Amnestic of the incident. Abrasion between eyebrows.  The large hematoma in the right parietal scalp.  CT HEAD WITHOUT CONTRAST  Technique:  Contiguous axial images were obtained from the base of the skull through  the vertex without contrast.  Comparison: CT head without contrast 09/12/2009.  Findings: A large right parietal scalp hematoma is present. Although there is some motion in this area, there is no underlying fracture. Minimal soft tissue swelling is noted of the glabella. There is no underlying fracture.  No other significant extracranial injury is evident.  The study is mildly degraded by motion.  Atrophy and moderate white matter change is stable.  No acute cortical infarct, hemorrhage, or mass lesion is present.  Dolichoectasia of the basilar artery is stable.  The ventricles are proportionate to the degree of atrophy. No significant extra-axial fluid collection is present.  The paranasal sinuses and mastoid air cells are clear.  A metallic scleral band of the lateral aspect of the right globe is stable.  IMPRESSION:  1.  The large right parietal scalp hematoma without underlying fracture. 2.  Minimal soft tissue swelling above the glabella without underlying fracture. 3.  Stable atrophy and white matter disease. 4.  No acute intracranial abnormality. 5.  Atherosclerosis.  Original Report Authenticated By: Jamesetta Orleans. MATTERN, M.D.      Assessment Present on Admission:  .Syncope and collapse .Essential hypertension, benign .HYPERLIPIDEMIA .SVT (supraventricular tachycardia) .CAD (coronary artery disease)   PLAN: Will admit him to the step down for closer monitoring. I will start him on the diltiazem drip with no bolus as he is too Tachycardic for known coronary disease with cardiac stents. We'll rule out with serial CPKs and troponins. I would not repeat an echo, as its unlikely to change therapy. Will diurese him further with intravenous Lasix and hold his oral Lasix. We'll continue his home medications, check his thyroid function, and follow his electrolytes carefully. He is stable, full code, and will be [facility]. I specifically discussed his CODE STATUS, and he is  a full code. We will honor this wish.   Other plans as per orders.    Alin Hutchins 10/31/2011, 8:00 PM

## 2011-10-31 NOTE — ED Notes (Signed)
Attempted to return call to [employer] he is unable to come to the phone at this time. Pt family member BW phillips request if change in status at [phone]

## 2011-10-31 NOTE — ED Notes (Signed)
Eugene [name] in waiting room.  Please page when pt return [phone]  Has PHA

## 2011-11-01 ENCOUNTER — Encounter (HOSPITAL_COMMUNITY): Payer: Self-pay | Admitting: General Practice

## 2011-11-01 DIAGNOSIS — R55 Syncope and collapse: Secondary | ICD-10-CM

## 2011-11-01 LAB — CBC
MCV: 88.2 fL (ref 78.0–100.0)
Platelets: 167 10*3/uL (ref 150–400)
Platelets: 152 10*3/uL (ref 150–400)
RBC: 4.81 MIL/uL (ref 4.22–5.81)
RDW: 14.5 % (ref 11.5–15.5)
RDW: 14.6 % (ref 11.5–15.5)
WBC: 7.7 10*3/uL (ref 4.0–10.5)
WBC: 7 10*3/uL (ref 4.0–10.5)

## 2011-11-01 LAB — BASIC METABOLIC PANEL
Calcium: 8.6 mg/dL (ref 8.4–10.5)
Creatinine, Ser: 0.86 mg/dL (ref 0.50–1.35)
GFR calc Af Amer: 89 mL/min — ABNORMAL LOW (ref >90)
GFR calc non Af Amer: 77 mL/min — ABNORMAL LOW (ref >90)

## 2011-11-01 LAB — CREATININE, SERUM
GFR calc Af Amer: >90 mL/min (ref >90)
GFR calc non Af Amer: 78 mL/min — ABNORMAL LOW (ref >90)

## 2011-11-01 LAB — CARDIAC PANEL(CRET KIN+CKTOT+MB+TROPI)
CK, MB: 3.1 ng/mL (ref 0.3–4.0)
CK, MB: 2.6 ng/mL (ref 0.3–4.0)
Relative Index: INVALID (ref 0.0–2.5)
Total CK: 103 U/L (ref 7–232)
Total CK: 97 U/L (ref 7–232)
Troponin I: <0.3 ng/mL (ref <0.30)
Troponin I: <0.3 ng/mL (ref <0.30)

## 2011-11-01 LAB — TSH: TSH: 0.89 u[IU]/mL (ref 0.350–4.500)

## 2011-11-01 MED ORDER — SODIUM CHLORIDE 0.9 % IJ SOLN
3.0000 mL | Freq: Two times a day (BID) | INTRAMUSCULAR | Status: DC
  Administered 2011-11-02: 3 mL via INTRAVENOUS

## 2011-11-01 MED ORDER — SODIUM CHLORIDE 0.9 % IV SOLN
1.0000 mL/kg/h | INTRAVENOUS | Status: DC
  Administered 2011-11-02: 1 mL/kg/h via INTRAVENOUS

## 2011-11-01 MED ORDER — ACETAMINOPHEN 325 MG PO TABS
650.0000 mg | ORAL_TABLET | Freq: Four times a day (QID) | ORAL | Status: DC | PRN
  Administered 2011-11-01 – 2011-11-12 (×18): 650 mg via ORAL
  Filled 2011-11-01 (×18): qty 2

## 2011-11-01 MED ORDER — CARVEDILOL 12.5 MG PO TABS
12.5000 mg | ORAL_TABLET | Freq: Two times a day (BID) | ORAL | Status: DC
  Administered 2011-11-01 – 2011-11-13 (×22): 12.5 mg via ORAL
  Filled 2011-11-01 (×29): qty 1

## 2011-11-01 MED ORDER — ASPIRIN 81 MG PO CHEW
324.0000 mg | CHEWABLE_TABLET | ORAL | Status: AC
  Administered 2011-11-02: 324 mg via ORAL
  Filled 2011-11-01: qty 3
  Filled 2011-11-01: qty 1

## 2011-11-01 MED ORDER — POTASSIUM CHLORIDE CRYS ER 20 MEQ PO TBCR
40.0000 meq | EXTENDED_RELEASE_TABLET | Freq: Once | ORAL | Status: AC
  Administered 2011-11-01: 40 meq via ORAL
  Filled 2011-11-01: qty 2

## 2011-11-01 MED ORDER — SODIUM CHLORIDE 0.9 % IJ SOLN: 3.0000 mL | INTRAMUSCULAR | Status: DC | PRN

## 2011-11-01 MED ORDER — SODIUM CHLORIDE 0.9 % IV SOLN: 250.0000 mL | INTRAVENOUS | Status: DC | PRN

## 2011-11-01 NOTE — Progress Notes (Signed)
Odella Aquas notified pt req. To use his CPAP to nite.  Xzaiver Vayda,RN

## 2011-11-01 NOTE — Progress Notes (Signed)
Pt with no order for CPAP.  Dr. Dorise Hiss informed.  Will put in order.  Amanda Pea, RN

## 2011-11-01 NOTE — Progress Notes (Signed)
Utilization review completed. Eugene [name] 11/01/2011

## 2011-11-01 NOTE — Progress Notes (Signed)
Pt HR 140's asymptomatic.  Nonsustained.  Dr Milbert Coulter notifed.  Amanda Pea, RN

## 2011-11-01 NOTE — Consults (Signed)
CARDIOLOGY CONSULT NOTE  Patient ID: Eugene [name], MRN: [id number], DOB/AGE: [birth year] 78 y.o. Admit date: 10/31/2011 Date of Consult: 11/01/2011  Primary Physician: Melida Quitter, MD, MD Primary Cardiologist: [facility]  Chief Complaint: syncope   HPI Eugene [name] is a 78 y.o. male seen at the request of the teaching service because of syncope.  He is an 78 year old gentleman with a history of ischemic heart disease prior myocardial infarction in the early 1990s for which he underwent primary angioplasty. He's had no recurrent cardiac intervention. His most recent ejection fraction was about 40-45% with inferior wall motion abnormality. He denies chest pain; his shortness of breath is stable. He's had no edema.  I saw him in consultation twice most recently in July for documents a dose insensitive supraventricular tachycardia which a short RP. He was largely asymptomatic having been identified in the emergency room and was elected to treat him medically.  Yesterday he gone shopping. He was carrying groceries into the house. He fell while climbing the stairs. It was presumed that he was syncopal. He was unconscious for about 20-25 minutes. His account and was doing his taxes and heard him fall and it was about that long before EMS arrived and he awakened. I cannot tell for certain the sequence of events in the emergency room  the first electrocardiogram dated 1530 hours demonstrates sinus rhythm. The first vital signs I see recorded in the emergency room at 1501 demonstrates a heart rate of 95. There is a subsequent electrocardiogram at 1548 hours that demonstrates tachycardia.  He has had 2 episodes of tachycardia while on the monitor. there have been no significant spells of lightheadedness.   . Past Medical History  Diagnosis Date  . Glaucoma     Right eye  . Diverticulosis   . Macular degeneration of right eye   . Allergic rhinitis   . CAD (coronary artery disease)     status post  percutaneous transluminal coronary angioplasty in 1991.  . OSA on CPAP   . Hypertension   . Hyperlipidemia   . Chronic CHF (congestive heart failure)     ejection fraction of 40-45%, grade 2 diastolic  dysfunction, and mild aortic stenosis  . Supraventricular tachycardia     2 syndromes-nonsustained atrial tachycardia//adenosine responsive diuretic positive reentry probably AV node reentry  . Shortness of breath   . Sleep apnea     uses cpap  . GI bleed     hx of GI Bleed      Surgical History:  Past Surgical History  Procedure Date  . Implantation of baerveldt glaucoma device lant with scleral reinforcement using tutoplast tissue graft right eye.   . Coronary angioplasty with stent placement   . Corneal transplant      Home Meds: Prior to Admission medications   Medication Sig Start Date End Date Taking? Authorizing Provider  amLODipine (NORVASC) 5 MG tablet Take 10 mg by mouth daily. 07/10/11 07/09/12 Yes Deatra Robinson, MD  aspirin 81 MG tablet Take 81 mg by mouth daily.     Yes Historical Provider, MD  carvedilol (COREG) 6.25 MG tablet Take 6.25 mg by mouth 2 (two) times daily. 05/03/11 05/02/12 Yes Amanjot Sidhu, MD  docusate sodium (COLACE) 100 MG capsule Take 100 mg by mouth 2 (two) times daily.     Yes Historical Provider, MD  furosemide (LASIX) 20 MG tablet Take 20 mg by mouth daily. 07/24/11 07/23/12 Yes Deatra Robinson, MD  furosemide (LASIX) 40 MG tablet Take 40-80 mg  by mouth 2 (two) times daily. Take 2 tablets (80 MG) in the morning, and 1 tablet (40 MG) in the evening.   Yes Historical Provider, MD  losartan (COZAAR) 50 MG tablet Take 50 mg by mouth daily. 07/10/11  Yes Deatra Robinson, MD  loteprednol (LOTEMAX) 0.5 % ophthalmic suspension Place 1 drop into the right eye 4 (four) times daily.     Yes Historical Provider, MD  simvastatin (ZOCOR) 40 MG tablet Take 40 mg by mouth at bedtime. 05/17/11  Yes Rocco Serene, MD  travoprost, benzalkonium, (TRAVATAN) 0.004 %  ophthalmic solution Place 1 drop into the right eye at bedtime.     Yes Historical Provider, MD    Inpatient Medications:    . albuterol  2.5 mg Nebulization Once  . albuterol  2.5 mg Nebulization Once  . amLODipine  10 mg Oral Daily  . aspirin EC  81 mg Oral Daily  . carvedilol  12.5 mg Oral BID  . diltiazem (CARDIZEM) infusion  5 mg/hr Intravenous To Major  . docusate sodium  100 mg Oral BID  . enoxaparin  40 mg Subcutaneous QHS  . furosemide  40 mg Intravenous BID  . furosemide  80 mg Intravenous Once  . ipratropium  0.5 mg Nebulization Once  . losartan  50 mg Oral Daily  . loteprednol  1 drop Right Eye QID  . nitroGLYCERIN  1 inch Topical Once  . potassium chloride  40 mEq Oral Once  . simvastatin  40 mg Oral QHS  . sodium chloride  3 mL Intravenous Q12H  . Travoprost (BAK Free)  1 drop Right Eye QHS  . DISCONTD: aspirin  81 mg Oral Daily  . DISCONTD: carvedilol  6.25 mg Oral BID    Allergies:  Allergies  Allergen Reactions  . Penicillins     REACTION: "turns red"    History   Social History  . Marital Status: Widowed    Spouse Name: N/A    Number of Children: N/A  . Years of Education: N/A   Occupational History  . Not on file.   Social History Main Topics  . Smoking status: Former Smoker    Types: Cigarettes    Quit date: 10/08/1960  . Smokeless tobacco: Never Used  . Alcohol Use: No  . Drug Use: No  . Sexually Active: No   Other Topics Concern  . Not on file   Social History Narrative  . No narrative on file     History reviewed. No pertinent family history.   ROS:  Please see the history of present illness.   All other systems reviewed and negative.    Physical Exam:  Blood pressure 142/70, pulse 60, temperature 98.2 F (36.8 C), temperature source Oral, resp. rate 18, height 5\' 7"  (1.702 m), weight 209 lb 7 oz (95 kg), SpO2 91.00%. General: Well developed, well nourished elderly obese Caucasian male in no acute distress. Head:  Normocephalic, a bruise on his occiput  sclera non-icteric, no xanthomas, nares are without discharge. Lymph Nodes:  none Neck: Negative for carotid bruits. JVD not elevated. Lungs: Clear bilaterally to auscultation without wheezes, rales, or rhonchi. Breathing is unlabored. Heart: RRR with S1 S2. No murmurs, rubs, or gallops appreciated. Abdomen: Soft, non-tender, non-distended with normoactive bowel sounds. No hepatomegaly. No rebound/guarding. No obvious abdominal masses. Msk:  Strength and tone appear normal for age. Extremities: No clubbing or cyanosis. 1+ edema.  Distal pedal pulses are 2+ and equal bilaterally. Skin: Warm and Dry Neuro:  Alert and oriented X 3. CN III-XII intact Grossly normal sensory and motor function . Psych:  Responds to questions appropriately with a normal affect.      Labs: Cardiac Enzymes  Basename 11/01/11 1552 11/01/11 0540 11/01/11 0009 10/31/11 1549 10/31/11 1532  CKTOTAL 114 97 103 101 --  CKMB 2.6 2.6 3.1 -- 4.1*  TROPONINI <0.30 <0.30 <0.30 -- <0.30   CBC Lab Results  Component Value Date   WBC 7.0 11/01/2011   HGB 13.7 11/01/2011   HCT 40.2 11/01/2011   MCV 88.2 11/01/2011   PLT 152 11/01/2011   PROTIME:  Basename 10/31/11 1549  LABPROT 13.5  INR 1.01   Chemistry  Lab 11/01/11 0438 10/31/11 1549  NA 138 --  K 3.3* --  CL 103 --  CO2 26 --  BUN 15 --  CREATININE 0.86 --  CALCIUM 8.6 --  PROT -- 7.3  BILITOT -- 0.6  ALKPHOS -- 80  ALT -- 16  AST -- 18  GLUCOSE 102* --   Lipids Lab Results  Component Value Date   CHOL 168 07/10/2011   HDL 39* 07/10/2011   LDLCALC 81 07/10/2011   TRIG 242* 07/10/2011   BNP Pro B Natriuretic peptide (BNP)  Date/Time Value Range Status  10/31/2011  4:52 PM 1475.0* 0-450 (pg/mL) Final  11/28/2010  6:14 AM 500.0* 0.0-100.0 (pg/mL) Final  11/23/2010  6:53 AM 432.0* 0.0-100.0 (pg/mL) Final  07/22/2009  3:40 PM 149.0* 0.0-100.0 (pg/mL) Final   Miscellaneous No results found for this basename:  DDIMER    Radiology/Studies:  Dg Chest 2 View  10/31/2011  *RADIOLOGY REPORT*  Clinical Data: Status post fall with a blow to the back of the head.  CHEST - 2 VIEW  Comparison: Plain films of the chest 04/13/2011.  Findings: There is cardiomegaly with some pulmonary vascular congestion.  No focal airspace disease or pleural effusion.  No pneumothorax.  Remote right humerus fracture noted.  IMPRESSION: Cardiomegaly and vascular congestion without acute disease.  Original Report Authenticated By: Bernadene Bell. Maricela Curet, M.D.   Ct Head Wo Contrast  10/31/2011  *RADIOLOGY REPORT*  Clinical Data: Fall going up steps.  Amnestic of the incident. Abrasion between eyebrows.  The large hematoma in the right parietal scalp.  CT HEAD WITHOUT CONTRAST  Technique:  Contiguous axial images were obtained from the base of the skull through the vertex without contrast.  Comparison: CT head without contrast 09/12/2009.  Findings: A large right parietal scalp hematoma is present. Although there is some motion in this area, there is no underlying fracture. Minimal soft tissue swelling is noted of the glabella. There is no underlying fracture.  No other significant extracranial injury is evident.  The study is mildly degraded by motion.  Atrophy and moderate white matter change is stable.  No acute cortical infarct, hemorrhage, or mass lesion is present.  Dolichoectasia of the basilar artery is stable.  The ventricles are proportionate to the degree of atrophy. No significant extra-axial fluid collection is present.  The paranasal sinuses and mastoid air cells are clear.  A metallic scleral band of the lateral aspect of the right globe is stable.  IMPRESSION:  1.  The large right parietal scalp hematoma without underlying fracture. 2.  Minimal soft tissue swelling above the glabella without underlying fracture. 3.  Stable atrophy and white matter disease. 4.  No acute intracranial abnormality. 5.  Atherosclerosis.  Original Report  Authenticated By: Jamesetta Orleans. MATTERN, M.D.    EKG: 1548 hours demonstrates narrow  QRS tachycardia at a short RP. Heart rate 144. ECG 1530 hours demonstrates sinus rhythm at 93 with intervals of 0.2 to/0.12/0.40 IVCD with repolarization abnormalities.   Telemetry demonstrates rate peaked episodes of onset of narrow QRS tachycardia consistent with atrial tachycardia  Assessment and Plan: The patient had a fall climbing up the stairs into his house. It is not clear to me that this was a syncopal episode. The prolonged duration of his loss of consciousness was likely related to a concussive injury I suspect. That he fell and hit his head is the cause of that. That he lost consciousness and hit his head is presumed and I think is difficult to attribute to the tachycardia. First I can find information that he was in tachycardia when EMS got there. Second he has had repeated episodes of tachycardia and in the emergency room where occurred his blood pressure was recorded as normal. Third he has had multiple episodes of this over the years unassociated with symptoms. I therefore suspect that more likely he tripped on the stairs and fell and hit his head.  The major concern in contradistinction to this his crit had ventricular tachycardia in the context of his ischemic cardiomyopathy. To that end, we will need to choose to pursue a fairly aggressive evaluation this be reasonable I think given his relative vigor notwithstanding his age. An echo would give [place] some insight. A catheterization followed by electrophysiological testing looking for the inducibility of sustained monomorphic ventricular tachycardia with complete that evaluation.  To that end, we'll order an echo and attempt to arrange for catheterization tomorrow. I'm not sure we can get to the EPS prior to next week.    We'll follow with you thank you for the consultation Patient Active Hospital Problem List: Syncope and collapse (10/31/2011)      ischemic cardiomyopathy (01/14/2009)   Essential hypertension, benign (01/14/2009)  * SVT (supraventricular tachycardia) (05/01/2011)   CAD (coronary artery disease) (05/01/2011)  Hypokalemia treaterd    Sherryl Manges

## 2011-11-01 NOTE — Progress Notes (Signed)
Hospital Admission Note Date: 11/01/2011  Patient name: Eugene [name] Medical record number: [id number] Date of birth: Feb 13, 1924 Age: 78 y.o. Gender: male PCP: Melida Quitter, MD, MD  Medical Service: Leitha Bleak  Attending physician: Dr. Coralee Pesa    1st Contact:    Dr. Milbert Coulter   Pager: [phone] 2nd Contact:  Dr. Anselm Jungling               Pager: [phone]  After 5 pm or weekends: 1st Contact:      Pager: [phone] 2nd Contact:      Pager: [phone]   Chief Complaint: Syncope  History of Present Illness: Patient is an 78 y.o. male with multiple medical problems including known coronary disease, congestive heart failure, mild aortic stenosis, supraventricular tachycardia, obstructive sleep apnea on CPAP brought into the emergency room as he had loss of consciousness while bringing his grocery from the car to his home. He did not recall the event. There has been no reported seizure activity, loss of bowel or bladder function, or post ictal confusion. He denied any chest pain or shortness of breath. He has no fever, chills, abdominal cramps or pain, black stool or bloody stool. Evaluation in the emergency room included an EKG which shows SVT, negative cardiac markers, normal renal functions, and chest x-ray showed vascular congestion with elevated BNP of 1400. In the emergency room he has persistent tachycardia with a heart rate of 140, blood pressure of 110, and is rather asymptomatic. Hospitalist was asked to admit patient for further evaluation and treatment. Overnight patient had no major events and was off diltiazem drip for the most part given that she was bradycardic with a tachycardic, on telemetry he had one event of nonsustained V. tach for 8 beats. We were contacted by hospitalist take over care of patient given that he follows up in our clinic, and his current care physician is Dr. Baltazar Apo, patient today has no new complaints, and otherwise feels well.   Meds: Medications Prior to Admission  Medication  Dose Route Frequency Provider Last Rate Last Dose  . 0.9 %  sodium chloride infusion   Intravenous Continuous Carleene Cooper III, MD      . 0.9 %  sodium chloride infusion  250 mL Intravenous PRN Houston Siren, MD      . acetaminophen (TYLENOL) tablet 650 mg  650 mg Oral Q6H PRN Maren Reamer, NP   650 mg at 11/01/11 0353  . albuterol (PROVENTIL) (5 MG/ML) 0.5% nebulizer solution 2.5 mg  2.5 mg Nebulization Once Carleene Cooper III, MD      . albuterol (PROVENTIL) (5 MG/ML) 0.5% nebulizer solution 2.5 mg  2.5 mg Nebulization Once Carleene Cooper III, MD      . albuterol (PROVENTIL) (5 MG/ML) 0.5% nebulizer solution           . albuterol (PROVENTIL) (5 MG/ML) 0.5% nebulizer solution        2.5 mg at 10/31/11 1557  . amLODipine (NORVASC) tablet 10 mg  10 mg Oral Daily Houston Siren, MD      . aspirin EC tablet 81 mg  81 mg Oral Daily Mutaz A Elmahi, MD      . carvedilol (COREG) tablet 6.25 mg  6.25 mg Oral BID Houston Siren, MD   6.25 mg at 11/01/11 0000  . diltiazem (CARDIZEM) 100 mg in dextrose 5 % 100 mL infusion  5 mg/hr Intravenous Continuous Carleene Cooper III, MD 5 mL/hr at 11/01/11 0629 5 mg/hr at 11/01/11 0629  . diltiazem (CARDIZEM) 100 mg  in dextrose 5 % 100 mL infusion  5 mg/hr Intravenous To Major Carleene Cooper III, MD 5 mL/hr at 10/31/11 2220 5 mg/hr at 10/31/11 2220  . docusate sodium (COLACE) capsule 100 mg  100 mg Oral BID Houston Siren, MD      . enoxaparin (LOVENOX) injection 40 mg  40 mg Subcutaneous QHS Houston Siren, MD   40 mg at 11/01/11 0000  . furosemide (LASIX) injection 40 mg  40 mg Intravenous BID Houston Siren, MD      . furosemide (LASIX) injection 80 mg  80 mg Intravenous Once Carleene Cooper III, MD   80 mg at 10/31/11 1957  . ipratropium (ATROVENT) nebulizer solution 0.5 mg  0.5 mg Nebulization Once Carleene Cooper III, MD   0.5 mg at 10/31/11 1557  . ipratropium (ATROVENT) nebulizer solution 0.5 mg  0.5 mg Nebulization Once Carleene Cooper III, MD      . losartan (COZAAR) tablet 50 mg  50 mg Oral Daily Houston Siren, MD      . loteprednol (LOTEMAX) 0.5 % ophthalmic suspension 1 drop  1 drop Right Eye QID Houston Siren, MD   1 drop at 11/01/11 0000  . nitroGLYCERIN (NITROGLYN) 2 % ointment 1 inch  1 inch Topical Once Carleene Cooper III, MD   1 inch at 10/31/11 1958  . ondansetron (ZOFRAN) 4 MG/2ML injection           . simvastatin (ZOCOR) tablet 40 mg  40 mg Oral QHS Houston Siren, MD   40 mg at 11/01/11 0000  . sodium chloride 0.9 % injection 3 mL  3 mL Intravenous Q12H Houston Siren, MD      . sodium chloride 0.9 % injection 3 mL  3 mL Intravenous PRN Houston Siren, MD      . Travoprost (BAK Free) (TRAVATAN) 0.004 % ophthalmic solution SOLN 1 drop  1 drop Right Eye QHS Houston Siren, MD   1 drop at 10/31/11 2300  . DISCONTD: aspirin tablet 81 mg  81 mg Oral Daily Houston Siren, MD       Medications Prior to Admission  Medication Sig Dispense Refill  . amLODipine (NORVASC) 5 MG tablet Take 10 mg by mouth daily.      Marland Kitchen aspirin 81 MG tablet Take 81 mg by mouth daily.        . carvedilol (COREG) 6.25 MG tablet Take 6.25 mg by mouth 2 (two) times daily.      Marland Kitchen docusate sodium (COLACE) 100 MG capsule Take 100 mg by mouth 2 (two) times daily.        . furosemide (LASIX) 20 MG tablet Take 20 mg by mouth daily.      Marland Kitchen losartan (COZAAR) 50 MG tablet Take 50 mg by mouth daily.      Marland Kitchen loteprednol (LOTEMAX) 0.5 % ophthalmic suspension Place 1 drop into the right eye 4 (four) times daily.        . simvastatin (ZOCOR) 40 MG tablet Take 40 mg by mouth at bedtime.      . travoprost, benzalkonium, (TRAVATAN) 0.004 % ophthalmic solution Place 1 drop into the right eye at bedtime.          Allergies: Penicillins Past Medical History  Diagnosis Date  . Glaucoma     Right eye  . Diverticulosis   . Macular degeneration of right eye   . Allergic rhinitis   . CAD (coronary artery disease)     status post percutaneous transluminal coronary angioplasty in 1991.  Marland Kitchen  OSA on CPAP   . Hypertension   . Hyperlipidemia   . Chronic CHF (congestive heart  failure)     ejection fraction of 40-45%, grade 2 diastolic  dysfunction, and mild aortic stenosis  . Supraventricular tachycardia     2 syndromes-nonsustained atrial tachycardia//adenosine responsive diuretic positive reentry probably AV node reentry   Past Surgical History  Procedure Date  . Implantation of baerveldt glaucoma device lant with scleral reinforcement using tutoplast tissue graft right eye.   . Coronary angioplasty with stent placement   . Corneal transplant    History reviewed. No pertinent family history. History   Social History  . Marital Status: Widowed    Spouse Name: N/A    Number of Children: N/A  . Years of Education: N/A   Occupational History  . Not on file.   Social History Main Topics  . Smoking status: Former Smoker    Types: Cigarettes    Quit date: 10/08/1960  . Smokeless tobacco: Not on file  . Alcohol Use: No  . Drug Use: No  . Sexually Active: Not on file   Other Topics Concern  . Not on file   Social History Narrative  . No narrative on file    Review of Systems: The patient denies anorexia, fever, weight loss,, vision loss, decreased hearing, hoarseness, chest pain, balance deficits, hemoptysis, abdominal pain, melena, hematochezia, severe indigestion/heartburn, hematuria, incontinence, genital sores, muscle weakness, suspicious skin lesions, transient blindness, difficulty walking, depression, unusual weight change, abnormal bleeding, enlarged lymph nodes, angioedema, and breast masses.   Physical Exam: Blood pressure 111/66, pulse 77, temperature 98.1 F (36.7 C), temperature source Oral, resp. rate 16, height 5\' 7"  (1.702 m), weight 213 lb 10 oz (96.9 kg), SpO2 94.00%. GEN: Pleasant person lying in bed in no acute distress; cooperative with exam  PSYCH: alert and oriented x4; does not appear anxious does not appear depressed; affect is normal  HEENT: Mucous membranes pink and anicteric; PERRLA; EOM intact; no cervical  lymphadenopathy nor thyromegaly or carotid bruit; + JVD elevation. HEART: regular, bradycardic; 2/6 SEM, no rubs or gallops  LUNGS: Normal respiration, mild bibasilar crackles at bases. ABDOMEN: Obese, soft non-tender; no masses, no organomegaly, normal abdominal bowel sounds; no pannus; no intertriginous candida.  EXTREMITIES: No bone or joint deformity; age-appropriate arthropathy of the hands and knees; there is 2+ edema bilaterally; no ulcerations.  PULSES: 2+ and symmetric  SKIN: Normal hydration no rash or ulceration  CNS: Cranial nerves 2-12 grossly intact no focal neurologic deficit  Lab results: Basic Metabolic Panel:  Basename 11/01/11 0438 11/01/11 0009 10/31/11 1549  NA 138 -- 137  K 3.3* -- 3.6  CL 103 -- 100  CO2 26 -- 24  GLUCOSE 102* -- 117*  BUN 15 -- 14  CREATININE 0.86 0.82 --  CALCIUM 8.6 -- 9.5  MG -- -- --  PHOS -- -- --   Liver Function Tests:  [facility] 10/31/11 1549  AST 18  ALT 16  ALKPHOS 80  BILITOT 0.6  PROT 7.3  ALBUMIN 3.7   CBC:  Basename 11/01/11 0438 11/01/11 0009 10/31/11 1549  WBC 7.0 7.7 --  NEUTROABS -- -- 9.3*  HGB 13.7 14.3 --  HCT 40.2 42.5 --  MCV 88.2 88.4 --  PLT 152 167 --   Cardiac Enzymes:  Basename 11/01/11 0009 10/31/11 1549 10/31/11 1532  CKTOTAL 103 101 102  CKMB 3.1 -- 4.1*  CKMBINDEX -- -- --  TROPONINI <0.30 -- <0.30   BNP:  [employer]  10/31/11 1652  PROBNP 1475.0*   Coagulation:  Basename 10/31/11 1549  LABPROT 13.5  INR 1.01   Urine Drug Screen:     Component Value Date/Time   LABOPIA NONE DETECTED 11/23/2010 0646   COCAINSCRNUR NONE DETECTED 11/23/2010 0646   LABBENZ NONE DETECTED 11/23/2010 0646   AMPHETMU NONE DETECTED 11/23/2010 0646   THCU NONE DETECTED 11/23/2010 0646   LABBARB  Value: NONE DETECTED        DRUG SCREEN FOR MEDICAL PURPOSES ONLY.  IF CONFIRMATION IS NEEDED FOR ANY PURPOSE, NOTIFY LAB WITHIN 5 DAYS.        LOWEST DETECTABLE LIMITS FOR URINE DRUG SCREEN Drug Class       Cutoff  (ng/mL) Amphetamine      1000 Barbiturate      200 Benzodiazepine   200 Tricyclics       300 Opiates          300 Cocaine          300 THC              50 11/23/2010 0646    Alcohol Level: No results found for this basename: ETH:2 in the last 72 hours  Imaging results:  Dg Chest 2 View  10/31/2011  *RADIOLOGY REPORT*  Clinical Data: Status post fall with a blow to the back of the head.  CHEST - 2 VIEW  Comparison: Plain films of the chest 04/13/2011.  Findings: There is cardiomegaly with some pulmonary vascular congestion.  No focal airspace disease or pleural effusion.  No pneumothorax.  Remote right humerus fracture noted.  IMPRESSION: Cardiomegaly and vascular congestion without acute disease.  Original Report Authenticated By: Bernadene Bell. Maricela Curet, M.D.   Ct Head Wo Contrast  10/31/2011  *RADIOLOGY REPORT*  Clinical Data: Fall going up steps.  Amnestic of the incident. Abrasion between eyebrows.  The large hematoma in the right parietal scalp.  CT HEAD WITHOUT CONTRAST  Technique:  Contiguous axial images were obtained from the base of the skull through the vertex without contrast.  Comparison: CT head without contrast 09/12/2009.  Findings: A large right parietal scalp hematoma is present. Although there is some motion in this area, there is no underlying fracture. Minimal soft tissue swelling is noted of the glabella. There is no underlying fracture.  No other significant extracranial injury is evident.  The study is mildly degraded by motion.  Atrophy and moderate white matter change is stable.  No acute cortical infarct, hemorrhage, or mass lesion is present.  Dolichoectasia of the basilar artery is stable.  The ventricles are proportionate to the degree of atrophy. No significant extra-axial fluid collection is present.  The paranasal sinuses and mastoid air cells are clear.  A metallic scleral band of the lateral aspect of the right globe is stable.  IMPRESSION:  1.  The large right parietal scalp  hematoma without underlying fracture. 2.  Minimal soft tissue swelling above the glabella without underlying fracture. 3.  Stable atrophy and white matter disease. 4.  No acute intracranial abnormality. 5.  Atherosclerosis.  Original Report Authenticated By: Jamesetta Orleans. MATTERN, M.D.    Other results: EKG: Sinus Rhythm, with PAC, non-specific early repo abnormalities with no other ST-T wave changes.   Assessment & Plan by Problem:  Syncope and collapse - patient's syncope was most likely related to his SVT, which was diagnosed by Dr. Clide Cliff, who last saw him for this in July 2012, at which time Dr. Clide Cliff suggested that this may be AV  nodal reentry in origin. he recommended to increase his beta blocker dose if this would become increasingly frequent or more symptomatic. Other possible causes for syncope could be worsening of his aortic stenosis, or his presentation of mild congestive heart failure Plan -Consider electrophysiology consult while the patient is in the hospital versus as an outpatient -Increase beta blockers from 6.25 twice a day to 12.5 twice a day.  -Transfer patient to telemetry from step down unit given stability and continue to monitor cardiac rhythm -Discontinue diltiazem drip -2-D echo to evaluate severity of aortic stenosis  CHF- patient's BNP was elevated on admission along with pulmonary congestion chest x-ray, he takes 20 mg of Lasix orally daily, during hospitalization increased IV Lasix 40 mg twice daily and normal saline lock IV fluids  CAD (coronary artery disease) stable, no ischemic changes on EKG, cardiac enzymes have been negative during this hospitalization, no changes in medication is needed.   SignedDarnelle Maffucci 11/01/2011, 8:49 AM

## 2011-11-02 ENCOUNTER — Encounter (HOSPITAL_COMMUNITY)
Admission: EM | Disposition: A | Discharge status: Skilled Nursing Facility | Payer: Self-pay | Source: Ambulatory Visit | Attending: Internal Medicine

## 2011-11-02 ENCOUNTER — Other Ambulatory Visit: Payer: Self-pay

## 2011-11-02 DIAGNOSIS — I2510 Atherosclerotic heart disease of native coronary artery without angina pectoris: Secondary | ICD-10-CM

## 2011-11-02 DIAGNOSIS — W19XXXA Unspecified fall, initial encounter: Secondary | ICD-10-CM

## 2011-11-02 DIAGNOSIS — I359 Nonrheumatic aortic valve disorder, unspecified: Secondary | ICD-10-CM

## 2011-11-02 HISTORY — PX: CORONARY ANGIOGRAM: SHX5786

## 2011-11-02 LAB — CBC
MCHC: 34.3 g/dL (ref 30.0–36.0)
MCV: 89.5 fL (ref 78.0–100.0)
Platelets: 136 10*3/uL — ABNORMAL LOW (ref 150–400)
RDW: 14.6 % (ref 11.5–15.5)
WBC: 7.7 10*3/uL (ref 4.0–10.5)

## 2011-11-02 LAB — BASIC METABOLIC PANEL
BUN: 17 mg/dL (ref 6–23)
GFR calc Af Amer: >90 mL/min (ref >90)
GFR calc non Af Amer: 78 mL/min — ABNORMAL LOW (ref >90)
Potassium: 3.3 mEq/L — ABNORMAL LOW (ref 3.5–5.1)
Sodium: 141 mEq/L (ref 135–145)

## 2011-11-02 LAB — CREATININE, SERUM
GFR calc Af Amer: >90 mL/min (ref >90)
GFR calc non Af Amer: 82 mL/min — ABNORMAL LOW (ref >90)

## 2011-11-02 SURGERY — CORONARY ANGIOGRAM

## 2011-11-02 MED ORDER — GUAIFENESIN-DM 100-10 MG/5ML PO SYRP
15.0000 mL | ORAL_SOLUTION | ORAL | Status: DC | PRN
  Administered 2011-11-02 – 2011-11-15 (×14): 15 mL via ORAL
  Filled 2011-11-02: qty 5
  Filled 2011-11-02 (×2): qty 15
  Filled 2011-11-02 (×2): qty 10
  Filled 2011-11-02: qty 15
  Filled 2011-11-02: qty 5
  Filled 2011-11-02 (×3): qty 15
  Filled 2011-11-02: qty 10
  Filled 2011-11-02: qty 5
  Filled 2011-11-02 (×2): qty 15
  Filled 2011-11-02: qty 5
  Filled 2011-11-02 (×2): qty 15

## 2011-11-02 MED ORDER — GUAIFENESIN-DM 100-10 MG/5ML PO SYRP
15.0000 mL | ORAL_SOLUTION | ORAL | Status: DC
  Filled 2011-11-02: qty 15

## 2011-11-02 MED ORDER — GUAIFENESIN-DM 100-10 MG/5ML PO SYRP: 5.0000 mL | ORAL_SOLUTION | ORAL | Status: DC | PRN

## 2011-11-02 MED ORDER — SIMVASTATIN 20 MG PO TABS
20.0000 mg | ORAL_TABLET | Freq: Every day | ORAL | Status: DC
  Administered 2011-11-02 – 2011-11-15 (×14): 20 mg via ORAL
  Filled 2011-11-02 (×17): qty 1

## 2011-11-02 MED ORDER — HYDROCOD POLST-CHLORPHEN POLST 10-8 MG/5ML PO LQCR: 5.0000 mL | Freq: Four times a day (QID) | ORAL | Status: DC | PRN

## 2011-11-02 MED ORDER — POTASSIUM CHLORIDE CRYS ER 20 MEQ PO TBCR: 40.0000 meq | EXTENDED_RELEASE_TABLET | Freq: Once | ORAL | Status: DC

## 2011-11-02 MED ORDER — POTASSIUM CHLORIDE 20 MEQ/15ML (10%) PO LIQD
40.0000 meq | Freq: Two times a day (BID) | ORAL | Status: AC
  Administered 2011-11-02 (×2): 40 meq via ORAL
  Filled 2011-11-02 (×2): qty 30

## 2011-11-02 MED ORDER — ONDANSETRON HCL 4 MG/2ML IJ SOLN: 4.0000 mg | Freq: Four times a day (QID) | INTRAMUSCULAR | Status: DC | PRN

## 2011-11-02 MED ORDER — SODIUM CHLORIDE 0.9 % IV SOLN: INTRAVENOUS | Status: AC

## 2011-11-02 MED ORDER — LIDOCAINE HCL (PF) 1 % IJ SOLN
INTRAMUSCULAR | Status: AC
  Filled 2011-11-02: qty 30

## 2011-11-02 MED ORDER — NITROGLYCERIN 0.2 MG/ML ON CALL CATH LAB
INTRAVENOUS | Status: AC
  Filled 2011-11-02: qty 1

## 2011-11-02 MED ORDER — HEPARIN (PORCINE) IN NACL 2-0.9 UNIT/ML-% IJ SOLN
INTRAMUSCULAR | Status: AC
  Filled 2011-11-02: qty 2000

## 2011-11-02 NOTE — Progress Notes (Signed)
   SUBJECTIVE:  Headache.  No chest pain   PHYSICAL EXAM Filed Vitals:   11/01/11 2022 11/01/11 2220 11/01/11 2316 11/02/11 0430  BP:  156/78  133/70  Pulse:  75 74 83  Temp:  98.2 F (36.8 C)  98.8 F (37.1 C)  TempSrc:  Oral  Oral  Resp:  20 18 20  Height:      Weight:    95.3 kg (210 lb 1.6 oz)  SpO2: 96% 94% 94% 96%   General:  No distress Lungs:  Clear Heart:  RRR Abdomen:  Positive bowel sounds, no rebound no guarding Extremities:  No edema. Neruo:  Intact HEENT:  Large hematoma on his head posterior  LABS: Lab Results  Component Value Date   CKTOTAL 114 11/01/2011   CKMB 2.6 11/01/2011   TROPONINI <0.30 11/01/2011   Results for orders placed during the hospital encounter of 10/31/11 (from the past 24 hour(s))  CARDIAC PANEL(CRET KIN+CKTOT+MB+TROPI)     Status: Normal   Collection Time   11/01/11  3:52 PM      Component Value Range   Total CK 114  7 - 232 (U/L)   CK, MB 2.6  0.3 - 4.0 (ng/mL)   Troponin I <0.30  <0.30 (ng/mL)   Relative Index 2.3  0.0 - 2.5   BASIC METABOLIC PANEL     Status: Abnormal   Collection Time   11/02/11  6:40 AM      Component Value Range   Sodium 141  135 - 145 (mEq/L)   Potassium 3.3 (*) 3.5 - 5.1 (mEq/L)   Chloride 105  96 - 112 (mEq/L)   CO2 24  19 - 32 (mEq/L)   Glucose, Bld 104 (*) 70 - 99 (mg/dL)   BUN 17  6 - 23 (mg/dL)   Creatinine, Ser 0.83  0.50 - 1.35 (mg/dL)   Calcium 8.5  8.4 - 10.5 (mg/dL)   GFR calc non Af Amer 78 (*) >90 (mL/min)   GFR calc Af Amer >90  >90 (mL/min)    Intake/Output Summary (Last 24 hours) at 11/02/11 0828 Last data filed at 11/02/11 0700  Gross per 24 hour  Intake 1602.08 ml  Output   1000 ml  Net 602.08 ml    ASSESSMENT AND PLAN:  1) CAD (coronary artery disease):  Cath today.   2)  Fall vs syncope:  Per Dr. Klein he suspects a fall but we cannot exclude VTach as an etiology.  Therefore we will cath and consider further EP evaluation.  3) HYPERLIPIDEMIA:  Continue current  therapy  4) Essential hypertension, benign:  BP is somewhat labile.  Continue current therapy.  5) SVT (supraventricular tachycardia):  No change in therapy.  This is not felt to be contributing to current events.  6) Hypokalemia:  Potassium supplemented.  7) Proteinuria noted:  Follow up per primary team.      [facility] 11/02/2011 8:28 AM   

## 2011-11-02 NOTE — Progress Notes (Signed)
Subjective: Patient denies any new complaints. Has 2 minute run of SVT overnight, which was asymptomatic. No other major event overnight,   Objective: Vital signs in last 24 hours: Filed Vitals:   11/01/11 2022 11/01/11 2220 11/01/11 2316 11/02/11 0430  BP:  156/78  133/70  Pulse:  75 74 83  Temp:  98.2 F (36.8 C)  98.8 F (37.1 C)  TempSrc:  Oral  Oral  Resp:  20 18 20   Height:      Weight:    210 lb 1.6 oz (95.3 kg)  SpO2: 96% 94% 94% 96%   Weight change: -1 lb 15.7 oz (-0.9 kg)  Intake/Output Summary (Last 24 hours) at 11/02/11 0850 Last data filed at 11/02/11 0700  Gross per 24 hour  Intake 1242.08 ml  Output   1000 ml  Net 242.08 ml   Physical Exam: General: No distress, laying in bed HEENT: Large hematoma on his head posterior Lungs: CTA-B Heart: RRR, 2/6 SEM without radiation to carotids Abdomen: Positive bowel sounds, no rebound no guarding  Extremities: Trace Edema Neruo: Non-Focal  Lab Results:  Basic Metabolic Panel:  Lab 11/02/11 7829 11/01/11 0438  NA 141 138  K 3.3* 3.3*  CL 105 103  CO2 24 26  GLUCOSE 104* 102*  BUN 17 15  CREATININE 0.83 0.86  CALCIUM 8.5 8.6  MG -- --  PHOS -- --   Liver Function Tests:  Lab 10/31/11 1549  AST 18  ALT 16  ALKPHOS 80  BILITOT 0.6  PROT 7.3  ALBUMIN 3.7   CBC:  Lab 11/01/11 0438 11/01/11 0009 10/31/11 1549  WBC 7.0 7.7 --  NEUTROABS -- -- 9.3*  HGB 13.7 14.3 --  HCT 40.2 42.5 --  MCV 88.2 88.4 --  PLT 152 167 --   Cardiac Enzymes:  Lab 11/01/11 1552 11/01/11 0540 11/01/11 0009  CKTOTAL 114 97 103  CKMB 2.6 2.6 3.1  CKMBINDEX -- -- --  TROPONINI <0.30 <0.30 <0.30   BNP:  Lab 10/31/11 1652  PROBNP 1475.0*     Studies/Results: Dg Chest 2 View  10/31/2011  *RADIOLOGY REPORT*  Clinical Data: Status post fall with a blow to the back of the head.  CHEST - 2 VIEW  Comparison: Plain films of the chest 04/13/2011.  Findings: There is cardiomegaly with some pulmonary vascular congestion.  No  focal airspace disease or pleural effusion.  No pneumothorax.  Remote right humerus fracture noted.  IMPRESSION: Cardiomegaly and vascular congestion without acute disease.  Original Report Authenticated By: Bernadene Bell. Maricela Curet, M.D.   Ct Head Wo Contrast  10/31/2011  *RADIOLOGY REPORT*  Clinical Data: Fall going up steps.  Amnestic of the incident. Abrasion between eyebrows.  The large hematoma in the right parietal scalp.  CT HEAD WITHOUT CONTRAST  Technique:  Contiguous axial images were obtained from the base of the skull through the vertex without contrast.  Comparison: CT head without contrast 09/12/2009.  Findings: A large right parietal scalp hematoma is present. Although there is some motion in this area, there is no underlying fracture. Minimal soft tissue swelling is noted of the glabella. There is no underlying fracture.  No other significant extracranial injury is evident.  The study is mildly degraded by motion.  Atrophy and moderate white matter change is stable.  No acute cortical infarct, hemorrhage, or mass lesion is present.  Dolichoectasia of the basilar artery is stable.  The ventricles are proportionate to the degree of atrophy. No significant extra-axial fluid collection is  present.  The paranasal sinuses and mastoid air cells are clear.  A metallic scleral band of the lateral aspect of the right globe is stable.  IMPRESSION:  1.  The large right parietal scalp hematoma without underlying fracture. 2.  Minimal soft tissue swelling above the glabella without underlying fracture. 3.  Stable atrophy and white matter disease. 4.  No acute intracranial abnormality. 5.  Atherosclerosis.  Original Report Authenticated By: Jamesetta Orleans. MATTERN, M.D.   Medications:  Scheduled Meds:   . albuterol  2.5 mg Nebulization Once  . albuterol  2.5 mg Nebulization Once  . amLODipine  10 mg Oral Daily  . aspirin  324 mg Oral Pre-Cath  . aspirin EC  81 mg Oral Daily  . carvedilol  12.5 mg Oral BID    . docusate sodium  100 mg Oral BID  . enoxaparin  40 mg Subcutaneous QHS  . furosemide  40 mg Intravenous BID  . ipratropium  0.5 mg Nebulization Once  . losartan  50 mg Oral Daily  . loteprednol  1 drop Right Eye QID  . potassium chloride  40 mEq Oral Once  . simvastatin  20 mg Oral QHS  . sodium chloride  3 mL Intravenous Q12H  . sodium chloride  3 mL Intravenous Q12H  . Travoprost (BAK Free)  1 drop Right Eye QHS  . DISCONTD: carvedilol  6.25 mg Oral BID  . DISCONTD: simvastatin  40 mg Oral QHS    Assessment/Plan:  Syncope: Per Dr. Graciela Husbands he suspects a fall but we cannot exclude VTach as an etiology. Therefore cardiology will perform cardiac cath and will consider further EP evaluation. For now, waiting for results from 2D-Echo, continue to monitor on telemetry for any arrythmia's and continue Coreg at 12.5mg  BID.  CHF: Patient remains mildly fluid overloaded on exam, continue diuresis at current dose of lasix 40mg  IV BID, continue to check daily weight and daily in and outs.  Essential hypertension: BP is well controlled. Continue current therapy without changes.   Hypokalemia: Potassium supplemented. Check BMET in AM.     LOS: 2 days   Eyonna Sandstrom 11/02/2011, 8:50 AM

## 2011-11-02 NOTE — Interval H&P Note (Signed)
History and Physical Interval Note:  11/02/2011 12:42 PM  [name]  has presented today for surgery, with the diagnosis of chest pain  The various methods of treatment have been discussed with the patient and family. After consideration of risks, benefits and other options for treatment, the patient has consented to  Procedure(s): LEFT HEART CATH as a surgical intervention .  The patients' history has been reviewed, patient examined, no change in status, stable for surgery.  I have reviewed the patients' chart and labs.  Questions were answered to the patient's satisfaction.     Rollene Rotunda

## 2011-11-02 NOTE — Procedures (Signed)
  Cardiac Catheterization Procedure Note  Name: Eugene [name] MRN: [id number] DOB: [birth year]  Procedure: Left Heart Cath, Selective Coronary Angiography, LV angiography  Indication:  Ischemic cardiomyopathy, AS, questionable syncope  Procedural details: The right groin was prepped, draped, and anesthetized with 1% lidocaine. Using modified Seldinger technique, a 5 French sheath was introduced into the right femoral artery. Standard Judkins catheters were used for coronary angiography and left ventriculography. Catheter exchanges were performed over a guidewire. There were no immediate procedural complications. The patient was transferred to the post catheterization recovery area for further monitoring.  Of note I did try multiple catheters including an Amplatz left with a straight long exchange wire to cross the aortic valve. I was unsuccessful.  Procedural Findings:  Hemodynamics:     AO 128/69    LV  NA   Coronary angiography:  Coronary dominance: Right  Left mainstem:   Luminal irregularities  Left anterior descending (LAD):   Ostial 95% stenosis. There is an aneurysmal dilatation immediately following this. The LAD otherwise has diffuse nonobstructive plaque. The LAD does feed collaterals to an occluded large posterior lateral. Mid diagonal is small to moderate sized and free of high-grade disease.  Ramus intermedius:  Very large vessel with mild luminal irregularities.  Left circumflex (LCx):  Circumflex in the AV groove happens proximal luminal irregularities. There is a branching mid obtuse marginal. There appears to be a proximal 70% stenosis prior to bifurcation. The remainder of the vessel is free of high-grade disease although there is a superior branch 30-40% stenosis.  Right coronary artery (RCA):  Severe diffuse disease. There is proximal 75 followed by 90% stenosis. There is diffuse mid heavy calcification with tandem focal 50% lesions. There is a long 90% stenosis before  a small PDA. The vessel is then occluded before the large posterior lateral with collaterals as described.  Left ventriculography: I was unable to cross the aortic valve.  Final Conclusions:  Severe coronary disease as described below. Echo results from this admission are pending. He is known to have a mild to moderately reduced ejection fraction by his previous echo with at least moderate aortic stenosis.  Recommendations: I will review the films further. I'll look at the echocardiogram to determine the degree of aortic stenosis and LV dysfunction. Given his advanced age percutaneous revascularization of the LAD alone might be the best treatment but this decision will need to be made carefully after consideration of the other data.  Rollene Rotunda 11/02/2011, 1:51 PM

## 2011-11-02 NOTE — Progress Notes (Signed)
Internal Medicine Teaching Service Attending Note Date: 11/02/2011  Patient name: Eugene [name]  Medical record number: [id number]  Date of birth: [birth year]   I have seen and evaluated [name] and discussed their care with the Residency Team.  Patient with history of SVT and CHF presenting with his first episode of syncope.  Physical Exam: Blood pressure 162/63, pulse 65, temperature 97.9 F (36.6 C), temperature source Oral, resp. rate 20, height 5\' 7"  (1.702 m), weight 210 lb 1.6 oz (95.3 kg), SpO2 94.00%. In NAD, very HOH Lungs-clear to my exam this AM Heart-RRR, II/VI SEM Abdom-+bs, obese, NT Extrem-2+ edema Neuro-nonfocal  Lab results: Results for orders placed during the hospital encounter of 10/31/11 (from the past 24 hour(s))  CARDIAC PANEL(CRET KIN+CKTOT+MB+TROPI)     Status: Normal   Collection Time   11/01/11  3:52 PM      Component Value Range   Total CK 114  7 - 232 (U/L)   CK, MB 2.6  0.3 - 4.0 (ng/mL)   Troponin I <0.30  <0.30 (ng/mL)   Relative Index 2.3  0.0 - 2.5   BASIC METABOLIC PANEL     Status: Abnormal   Collection Time   11/02/11  6:40 AM      Component Value Range   Sodium 141  135 - 145 (mEq/L)   Potassium 3.3 (*) 3.5 - 5.1 (mEq/L)   Chloride 105  96 - 112 (mEq/L)   CO2 24  19 - 32 (mEq/L)   Glucose, Bld 104 (*) 70 - 99 (mg/dL)   BUN 17  6 - 23 (mg/dL)   Creatinine, Ser 4.09  0.50 - 1.35 (mg/dL)   Calcium 8.5  8.4 - 81.1 (mg/dL)   GFR calc non Af Amer 78 (*) >90 (mL/min)   GFR calc Af Amer >90  >90 (mL/min)    Imaging results:  Dg Chest 2 View  10/31/2011  *RADIOLOGY REPORT*  Clinical Data: Status post fall with a blow to the back of the head.  CHEST - 2 VIEW  Comparison: Plain films of the chest 04/13/2011.  Findings: There is cardiomegaly with some pulmonary vascular congestion.  No focal airspace disease or pleural effusion.  No pneumothorax.  Remote right humerus fracture noted.  IMPRESSION: Cardiomegaly and vascular congestion without  acute disease.  Original Report Authenticated By: Bernadene Bell. Maricela Curet, M.D.   Ct Head Wo Contrast  10/31/2011  *RADIOLOGY REPORT*  Clinical Data: Fall going up steps.  Amnestic of the incident. Abrasion between eyebrows.  The large hematoma in the right parietal scalp.  CT HEAD WITHOUT CONTRAST  Technique:  Contiguous axial images were obtained from the base of the skull through the vertex without contrast.  Comparison: CT head without contrast 09/12/2009.  Findings: A large right parietal scalp hematoma is present. Although there is some motion in this area, there is no underlying fracture. Minimal soft tissue swelling is noted of the glabella. There is no underlying fracture.  No other significant extracranial injury is evident.  The study is mildly degraded by motion.  Atrophy and moderate white matter change is stable.  No acute cortical infarct, hemorrhage, or mass lesion is present.  Dolichoectasia of the basilar artery is stable.  The ventricles are proportionate to the degree of atrophy. No significant extra-axial fluid collection is present.  The paranasal sinuses and mastoid air cells are clear.  A metallic scleral band of the lateral aspect of the right globe is stable.  IMPRESSION:  1.  The  large right parietal scalp hematoma without underlying fracture. 2.  Minimal soft tissue swelling above the glabella without underlying fracture. 3.  Stable atrophy and white matter disease. 4.  No acute intracranial abnormality. 5.  Atherosclerosis.  Original Report Authenticated By: Jamesetta Orleans. MATTERN, M.D.    Assessment and Plan: I agree with the formulated Assessment and Plan with the following changes:  Plan as per patient's EP MD.

## 2011-11-02 NOTE — H&P (View-Only) (Signed)
   SUBJECTIVE:  Headache.  No chest pain   PHYSICAL EXAM Filed Vitals:   11/01/11 2022 11/01/11 2220 11/01/11 2316 11/02/11 0430  BP:  156/78  133/70  Pulse:  75 74 83  Temp:  98.2 F (36.8 C)  98.8 F (37.1 C)  TempSrc:  Oral  Oral  Resp:  20 18 20   Height:      Weight:    95.3 kg (210 lb 1.6 oz)  SpO2: 96% 94% 94% 96%   General:  No distress Lungs:  Clear Heart:  RRR Abdomen:  Positive bowel sounds, no rebound no guarding Extremities:  No edema. Neruo:  Intact HEENT:  Large hematoma on his head posterior  LABS: Lab Results  Component Value Date   CKTOTAL 114 11/01/2011   CKMB 2.6 11/01/2011   TROPONINI <0.30 11/01/2011   Results for orders placed during the hospital encounter of 10/31/11 (from the past 24 hour(s))  CARDIAC PANEL(CRET KIN+CKTOT+MB+TROPI)     Status: Normal   Collection Time   11/01/11  3:52 PM      Component Value Range   Total CK 114  7 - 232 (U/L)   CK, MB 2.6  0.3 - 4.0 (ng/mL)   Troponin I <0.30  <0.30 (ng/mL)   Relative Index 2.3  0.0 - 2.5   BASIC METABOLIC PANEL     Status: Abnormal   Collection Time   11/02/11  6:40 AM      Component Value Range   Sodium 141  135 - 145 (mEq/L)   Potassium 3.3 (*) 3.5 - 5.1 (mEq/L)   Chloride 105  96 - 112 (mEq/L)   CO2 24  19 - 32 (mEq/L)   Glucose, Bld 104 (*) 70 - 99 (mg/dL)   BUN 17  6 - 23 (mg/dL)   Creatinine, Ser 1.61  0.50 - 1.35 (mg/dL)   Calcium 8.5  8.4 - 09.6 (mg/dL)   GFR calc non Af Amer 78 (*) >90 (mL/min)   GFR calc Af Amer >90  >90 (mL/min)    Intake/Output Summary (Last 24 hours) at 11/02/11 0828 Last data filed at 11/02/11 0700  Gross per 24 hour  Intake 1602.08 ml  Output   1000 ml  Net 602.08 ml    ASSESSMENT AND PLAN:  1) CAD (coronary artery disease):  Cath today.   2)  Fall vs syncope:  Per Dr. Graciela Husbands he suspects a fall but we cannot exclude VTach as an etiology.  Therefore we will cath and consider further EP evaluation.  3) HYPERLIPIDEMIA:  Continue current  therapy  4) Essential hypertension, benign:  BP is somewhat labile.  Continue current therapy.  5) SVT (supraventricular tachycardia):  No change in therapy.  This is not felt to be contributing to current events.  6) Hypokalemia:  Potassium supplemented.  7) Proteinuria noted:  Follow up per primary team.      Rollene Rotunda 11/02/2011 8:28 AM

## 2011-11-02 NOTE — Progress Notes (Signed)
11/02/11 1515 Nursing Note: Pt arrived back to room with no complications. Right groin site level 0. Pt has cough and has been educated to hold right groin site to reduce chances of bleeding. Pt verbalized understanding. Pt's vital signs stable see doc flowsheets. Pt"s pedal pulses normal +2. Pt has no complaints at this time. Will continue to monitor pt.Eugene [name] Scientist, clinical (histocompatibility and immunogenetics).

## 2011-11-03 DIAGNOSIS — I2510 Atherosclerotic heart disease of native coronary artery without angina pectoris: Secondary | ICD-10-CM

## 2011-11-03 DIAGNOSIS — I509 Heart failure, unspecified: Secondary | ICD-10-CM

## 2011-11-03 LAB — BASIC METABOLIC PANEL
BUN: 12 mg/dL (ref 6–23)
CO2: 25 mEq/L (ref 19–32)
CO2: 24 mEq/L (ref 19–32)
Calcium: 8.6 mg/dL (ref 8.4–10.5)
Calcium: 8.4 mg/dL (ref 8.4–10.5)
Chloride: 103 mEq/L (ref 96–112)
Creatinine, Ser: 0.67 mg/dL (ref 0.50–1.35)
Creatinine, Ser: 0.74 mg/dL (ref 0.50–1.35)
GFR calc non Af Amer: 82 mL/min — ABNORMAL LOW (ref >90)
Glucose, Bld: 134 mg/dL — ABNORMAL HIGH (ref 70–99)
Sodium: 136 mEq/L (ref 135–145)

## 2011-11-03 MED ORDER — ALBUTEROL SULFATE (5 MG/ML) 0.5% IN NEBU
2.5000 mg | INHALATION_SOLUTION | Freq: Once | RESPIRATORY_TRACT | Status: AC
  Administered 2011-11-03: 2.5 mg via RESPIRATORY_TRACT

## 2011-11-03 MED ORDER — FUROSEMIDE 10 MG/ML IJ SOLN
40.0000 mg | Freq: Three times a day (TID) | INTRAMUSCULAR | Status: DC
  Administered 2011-11-03 – 2011-11-07 (×12): 40 mg via INTRAVENOUS
  Filled 2011-11-03 (×15): qty 4

## 2011-11-03 MED ORDER — POTASSIUM CHLORIDE CRYS ER 20 MEQ PO TBCR
40.0000 meq | EXTENDED_RELEASE_TABLET | ORAL | Status: AC
  Administered 2011-11-03 (×2): 40 meq via ORAL
  Filled 2011-11-03: qty 2

## 2011-11-03 MED ORDER — SPIRONOLACTONE 12.5 MG HALF TABLET
12.5000 mg | ORAL_TABLET | Freq: Every day | ORAL | Status: DC
  Administered 2011-11-03 – 2011-11-16 (×12): 12.5 mg via ORAL
  Filled 2011-11-03 (×15): qty 1

## 2011-11-03 MED ORDER — IPRATROPIUM BROMIDE 0.02 % IN SOLN
0.5000 mg | Freq: Once | RESPIRATORY_TRACT | Status: AC
  Administered 2011-11-03: 0.5 mg via RESPIRATORY_TRACT

## 2011-11-03 NOTE — Progress Notes (Signed)
Patient ID: Eugene [name], male   DOB: [birth year], 78 y.o.   MRN: [id number]    SUBJECTIVE: Patient is short of breath this morning.  No chest pain.      Marland Kitchen albuterol  2.5 mg Nebulization Once  . albuterol  2.5 mg Nebulization Once  . ipratropium  0.5 mg Nebulization Once   And  . albuterol  2.5 mg Nebulization Once  . amLODipine  10 mg Oral Daily  . aspirin EC  81 mg Oral Daily  . carvedilol  12.5 mg Oral BID  . docusate sodium  100 mg Oral BID  . enoxaparin  40 mg Subcutaneous QHS  . furosemide  40 mg Intravenous Q8H  . heparin      . ipratropium  0.5 mg Nebulization Once  . lidocaine      . losartan  50 mg Oral Daily  . loteprednol  1 drop Right Eye QID  . nitroGLYCERIN      . potassium chloride  40 mEq Oral BID  . potassium chloride  40 mEq Oral Once  . potassium chloride  40 mEq Oral Q4H  . simvastatin  20 mg Oral QHS  . spironolactone  12.5 mg Oral Daily  . Travoprost (BAK Free)  1 drop Right Eye QHS  . DISCONTD: furosemide  40 mg Intravenous BID  . DISCONTD: guaiFENesin-dextromethorphan  15 mL Oral To Cath  . DISCONTD: sodium chloride  3 mL Intravenous Q12H  . DISCONTD: sodium chloride  3 mL Intravenous Q12H      Filed Vitals:   11/03/11 0450 11/03/11 0551 11/03/11 0933 11/03/11 1057  BP:  150/86  140/80  Pulse: 67 65 68 68  Temp:  98.1 F (36.7 C)    TempSrc:      Resp:  18 22 18   Height:      Weight:  92.8 kg (204 lb 9.4 oz)    SpO2: 93% 93% 93%     Intake/Output Summary (Last 24 hours) at 11/03/11 1334 Last data filed at 11/03/11 1316  Gross per 24 hour  Intake 1338.75 ml  Output   3300 ml  Net -1961.25 ml    LABS: Basic Metabolic Panel:  Basename 11/03/11 0630 11/02/11 1615 11/02/11 0640  NA 138 -- 141  K 3.3* -- 3.3*  CL 103 -- 105  CO2 25 -- 24  GLUCOSE 100* -- 104*  BUN 12 -- 17  CREATININE 0.67 0.73 --  CALCIUM 8.6 -- 8.5  MG -- -- --  PHOS -- -- --   Liver Function Tests:  [facility] 10/31/11 1549  AST 18  ALT 16  ALKPHOS 80    BILITOT 0.6  PROT 7.3  ALBUMIN 3.7   No results found for this basename: LIPASE:2,AMYLASE:2 in the last 72 hours CBC:  Basename 11/02/11 1615 11/01/11 0438 10/31/11 1549  WBC 7.7 7.0 --  NEUTROABS -- -- 9.3*  HGB 14.7 13.7 --  HCT 42.8 40.2 --  MCV 89.5 88.2 --  PLT 136* 152 --   Cardiac Enzymes:  Basename 11/01/11 1552 11/01/11 0540 11/01/11 0009  CKTOTAL 114 97 103  CKMB 2.6 2.6 3.1  CKMBINDEX -- -- --  TROPONINI <0.30 <0.30 <0.30   Thyroid Function Tests:  Basename 11/01/11 0009  TSH 0.890  T4TOTAL --  T3FREE --  THYROIDAB --   Anemia Panel: No results found for this basename: VITAMINB12,FOLATE,FERRITIN,TIBC,IRON,RETICCTPCT in the last 72 hours  RADIOLOGY: Dg Chest 2 View  10/31/2011  *RADIOLOGY REPORT*  Clinical Data: Status post  fall with a blow to the back of the head.  CHEST - 2 VIEW  Comparison: Plain films of the chest 04/13/2011.  Findings: There is cardiomegaly with some pulmonary vascular congestion.  No focal airspace disease or pleural effusion.  No pneumothorax.  Remote right humerus fracture noted.  IMPRESSION: Cardiomegaly and vascular congestion without acute disease.  Original Report Authenticated By: Bernadene Bell. Maricela Curet, M.D.   Ct Head Wo Contrast  10/31/2011  *RADIOLOGY REPORT*  Clinical Data: Fall going up steps.  Amnestic of the incident. Abrasion between eyebrows.  The large hematoma in the right parietal scalp.  CT HEAD WITHOUT CONTRAST  Technique:  Contiguous axial images were obtained from the base of the skull through the vertex without contrast.  Comparison: CT head without contrast 09/12/2009.  Findings: A large right parietal scalp hematoma is present. Although there is some motion in this area, there is no underlying fracture. Minimal soft tissue swelling is noted of the glabella. There is no underlying fracture.  No other significant extracranial injury is evident.  The study is mildly degraded by motion.  Atrophy and moderate white matter  change is stable.  No acute cortical infarct, hemorrhage, or mass lesion is present.  Dolichoectasia of the basilar artery is stable.  The ventricles are proportionate to the degree of atrophy. No significant extra-axial fluid collection is present.  The paranasal sinuses and mastoid air cells are clear.  A metallic scleral band of the lateral aspect of the right globe is stable.  IMPRESSION:  1.  The large right parietal scalp hematoma without underlying fracture. 2.  Minimal soft tissue swelling above the glabella without underlying fracture. 3.  Stable atrophy and white matter disease. 4.  No acute intracranial abnormality. 5.  Atherosclerosis.  Original Report Authenticated By: Jamesetta Orleans. MATTERN, M.D.    PHYSICAL EXAM General: NAD Neck: JVP 10 cm, no thyromegaly or thyroid nodule.  Lungs: Crackles 1/2 way up lung fields bilaterally with wheezing.  CV: Nondisplaced PMI.  Heart regular S1/S2, no S3/S4, 2/6 mid-peaking systolic murmur RUSB.  1+ edema 1/2 to knees bilaterally.   Abdomen: Soft, nontender, no hepatosplenomegaly, no distention.  Neurologic: Alert and oriented x 3.  Psych: Normal affect. Extremities: No clubbing or cyanosis.   ASSESSMENT AND PLAN: 78 yo with severe AS and 3 vessel disease as well as moderate systolic dysfunction.  1. CHF: Acute on chronic systolic with EF 16-10% in setting of severe AS.  He is volume overloaded with dyspnea and crackles on lung exam.  - Increase Lasix to 40 mg q8 hrs today.  - Add spironolactone 12.5 mg daily.  2. AS: Severe on echo.  He will be high risk for AVR given systolic dysfunction but options in absence of fixing the valve are poor.  Could consider TAVR but would need PCI LAD and RCA in addition.  3. CAD: Severe 3vd.  CABG + AVR versus PCI + TAVR consideration.  Continue ASA, statin.   Marca Ancona 11/03/2011 1:38 PM

## 2011-11-03 NOTE — Progress Notes (Signed)
Subjective:  He states that he has a dry cough which makes him SOB; otherwise, no complaints this morning except wanting his Lasix and Tylenol. Objective: Vital signs in last 24 hours: Filed Vitals:   11/03/11 0450 11/03/11 0551 11/03/11 0933 11/03/11 1057  BP:  150/86  140/80  Pulse: 67 65 68 68  Temp:  98.1 F (36.7 C)    TempSrc:      Resp:  18 22 18   Height:      Weight:  92.8 kg (204 lb 9.4 oz)    SpO2: 93% 93% 93%    Weight change: -3.2 kg (-7 lb 0.9 oz)  Intake/Output Summary (Last 24 hours) at 11/03/11 1254 Last data filed at 11/03/11 1100  Gross per 24 hour  Intake 978.75 ml  Output   2850 ml  Net -1871.25 ml   Physical Exam: General: mild distress 2/2 SOB (patient was not using [place]), laying in bed  HEENT: Large hematoma on his head posterior, tender to palpation Lungs: CTA-B, coarse breath sounds at bases, mild wheezing  Heart: RRR, no m/g/r Abdomen: Positive bowel sounds, no rebound no guarding  Extremities: Trace Edema  Neruo: Non-Focal  Lab Results: Basic Metabolic Panel:  Lab 11/03/11 4132 11/02/11 1615 11/02/11 0640  NA 138 -- 141  K 3.3* -- 3.3*  CL 103 -- 105  CO2 25 -- 24  GLUCOSE 100* -- 104*  BUN 12 -- 17  CREATININE 0.67 0.73 --  CALCIUM 8.6 -- 8.5  MG -- -- --  PHOS -- -- --   Liver Function Tests:  Lab 10/31/11 1549  AST 18  ALT 16  ALKPHOS 80  BILITOT 0.6  PROT 7.3  ALBUMIN 3.7   CBC:  Lab 11/02/11 1615 11/01/11 0438 10/31/11 1549  WBC 7.7 7.0 --  NEUTROABS -- -- 9.3*  HGB 14.7 13.7 --  HCT 42.8 40.2 --  MCV 89.5 88.2 --  PLT 136* 152 --   Cardiac Enzymes:  Lab 11/01/11 1552 11/01/11 0540 11/01/11 0009  CKTOTAL 114 97 103  CKMB 2.6 2.6 3.1  CKMBINDEX -- -- --  TROPONINI <0.30 <0.30 <0.30   BNP:  Lab 10/31/11 1652  PROBNP 1475.0*    Lab 11/01/11 0009  TSH 0.890  T4TOTAL --  FREET4 --  T3FREE --  THYROIDAB --   Coagulation:  Lab 10/31/11 1549  LABPROT 13.5  INR 1.01   Medications:  Scheduled Meds:    . albuterol  2.5 mg Nebulization Once  . albuterol  2.5 mg Nebulization Once  . ipratropium  0.5 mg Nebulization Once   And  . albuterol  2.5 mg Nebulization Once  . amLODipine  10 mg Oral Daily  . aspirin EC  81 mg Oral Daily  . carvedilol  12.5 mg Oral BID  . docusate sodium  100 mg Oral BID  . enoxaparin  40 mg Subcutaneous QHS  . furosemide  40 mg Intravenous BID  . heparin      . ipratropium  0.5 mg Nebulization Once  . lidocaine      . losartan  50 mg Oral Daily  . loteprednol  1 drop Right Eye QID  . nitroGLYCERIN      . potassium chloride  40 mEq Oral BID  . potassium chloride  40 mEq Oral Once  . potassium chloride  40 mEq Oral Q4H  . simvastatin  20 mg Oral QHS  . Travoprost (BAK Free)  1 drop Right Eye QHS  . DISCONTD: guaiFENesin-dextromethorphan  15  mL Oral To Cath  . DISCONTD: sodium chloride  3 mL Intravenous Q12H  . DISCONTD: sodium chloride  3 mL Intravenous Q12H   Continuous Infusions:   . sodium chloride 75 mL/hr at 11/02/11 1435  . DISCONTD: sodium chloride 1 mL/kg/hr (11/02/11 0405)   PRN Meds:.acetaminophen, chlorpheniramine-HYDROcodone, guaiFENesin-dextromethorphan, ondansetron (ZOFRAN) IV, DISCONTD: sodium chloride, DISCONTD: sodium chloride, DISCONTD: guaiFENesin-dextromethorphan, DISCONTD: sodium chloride, DISCONTD: sodium chloride  Assessment/Plan: 1. Syncope: unclear etiology.  Patient is s/p catheterization day 1.  2D echo shows EF 30-35% with regional wall abnormality with critical stenosis of aortic valve.  Overnight, he appeared to have 4 episodes of sinus tachycardia with rate up to 140's on monitor.   --Continue Coreg 12.5mg  BID  --Appreciate cardiology's input  2. CAD- s/p heart cath.  Please see #1. Continue ASA and BB, statin   2. Systolic CHF: Net negative 1.5L yesterday.  Down negative 3 kilograms in weight. --Will continue diuresis at current dose of lasix 40mg  IV BID --continue to check daily weight and daily in and outs.  --Neb  treatments and O2 for SOB  3. Essential hypertension: BP is adequately controlled -- Continue Lasix, Amlodipine, Coreg, and Losartan  4. Hypokalemia: K 3.3. Potassium supplemented with KDur x2 doses. Repeat BMP at [facility] and in AM    LOS: 3 days   Souleymane Saiki 11/03/2011, 12:54 PM

## 2011-11-04 ENCOUNTER — Inpatient Hospital Stay (HOSPITAL_COMMUNITY): Payer: Medicare Other

## 2011-11-04 ENCOUNTER — Other Ambulatory Visit: Payer: Self-pay

## 2011-11-04 LAB — BASIC METABOLIC PANEL
BUN: 16 mg/dL (ref 6–23)
Calcium: 8.8 mg/dL (ref 8.4–10.5)
Creatinine, Ser: 0.76 mg/dL (ref 0.50–1.35)
GFR calc Af Amer: >90 mL/min (ref >90)
GFR calc non Af Amer: 81 mL/min — ABNORMAL LOW (ref >90)
Glucose, Bld: 91 mg/dL (ref 70–99)
Potassium: 3.4 mEq/L — ABNORMAL LOW (ref 3.5–5.1)

## 2011-11-04 LAB — PROTIME-INR
INR: 1.07 (ref 0.00–1.49)
Prothrombin Time: 14.1 seconds (ref 11.6–15.2)

## 2011-11-04 LAB — CBC
MCH: 29.9 pg (ref 26.0–34.0)
MCHC: 34.1 g/dL (ref 30.0–36.0)
Platelets: 153 10*3/uL (ref 150–400)
RDW: 14.4 % (ref 11.5–15.5)

## 2011-11-04 MED ORDER — POTASSIUM CHLORIDE CRYS ER 20 MEQ PO TBCR
40.0000 meq | EXTENDED_RELEASE_TABLET | Freq: Once | ORAL | Status: AC
  Administered 2011-11-04: 40 meq via ORAL
  Filled 2011-11-04: qty 2

## 2011-11-04 MED ORDER — HYDROCOD POLST-CHLORPHEN POLST 10-8 MG/5ML PO LQCR
5.0000 mL | Freq: Two times a day (BID) | ORAL | Status: DC | PRN
  Administered 2011-11-04 – 2011-11-14 (×3): 5 mL via ORAL
  Filled 2011-11-04 (×4): qty 5

## 2011-11-04 NOTE — Progress Notes (Signed)
Pt's heart rate was dropping into the 40s and at one point in time went down to 36 non-sustained, pt was asymtomatic, no complaints of CP,SOB or dizziness, pt stated he felt ok when rn checked on him. MD on-call Dr Yaakov Guthrie was notified who came to check on patient, EKG was obtained.  Dr.Wainright called back and said since patient was asystematic, nothing will be done now but to monitor pt. Will continue to monitor pt. ----- Railey Glad, D. rn.

## 2011-11-04 NOTE — Progress Notes (Signed)
Subjective: He reports that his SOB is improved this morning.  Still has a cough x 2 days but denies any fever, chills  Objective: Vital signs in last 24 hours: Filed Vitals:   11/03/11 1057 11/03/11 1451 11/03/11 2059 11/04/11 0510  BP: 140/80 118/69 130/73 124/77  Pulse: 68 49 59 56  Temp:  99 F (37.2 C) 98 F (36.7 C) 97.3 F (36.3 C)  TempSrc:  Oral Oral Oral  Resp: 18 19 18 20   Height:      Weight:    92.1 kg (203 lb 0.7 oz)  SpO2:  93% 93% 91%   Weight change: -0.7 kg (-1 lb 8.7 oz)  Intake/Output Summary (Last 24 hours) at 11/04/11 1103 Last data filed at 11/04/11 1044  Gross per 24 hour  Intake   1348 ml  Output   1226 ml  Net    122 ml   Physical Exam: General: NAD, laying in bed comfortably HEENT: Large hematoma on his head posterior, tender to palpation  Lungs: CTA-B, coarse breath sounds and crackles up to mid lung fields, no wheezing Heart: RRR, no m/g/r  Abdomen: Positive bowel sounds, no rebound no guarding  Extremities: Trace Edema  Neruo: Non-Focal  Lab Results: Basic Metabolic Panel:  Lab 11/04/11 7829 11/03/11 1520  NA 137 136  K 3.4* 3.4*  CL 101 102  CO2 23 24  GLUCOSE 91 134*  BUN 16 13  CREATININE 0.76 0.74  CALCIUM 8.8 8.4  MG -- --  PHOS -- --   Liver Function Tests:  Lab 10/31/11 1549  AST 18  ALT 16  ALKPHOS 80  BILITOT 0.6  PROT 7.3  ALBUMIN 3.7   CBC:  Lab 11/04/11 0550 11/02/11 1615 10/31/11 1549  WBC 7.7 7.7 --  NEUTROABS -- -- 9.3*  HGB 14.0 14.7 --  HCT 41.1 42.8 --  MCV 87.8 89.5 --  PLT 153 136* --   Cardiac Enzymes:  Lab 11/01/11 1552 11/01/11 0540 11/01/11 0009  CKTOTAL 114 97 103  CKMB 2.6 2.6 3.1  CKMBINDEX -- -- --  TROPONINI <0.30 <0.30 <0.30   BNP:  Lab 10/31/11 1652  PROBNP 1475.0*   Thyroid Function Tests:  Lab 11/01/11 0009  TSH 0.890  T4TOTAL --  FREET4 --  T3FREE --  THYROIDAB --   Coagulation:  Lab 10/31/11 1549  LABPROT 13.5  INR 1.01   Medications: Scheduled Meds:    . albuterol  2.5 mg Nebulization Once  . albuterol  2.5 mg Nebulization Once  . amLODipine  10 mg Oral Daily  . aspirin EC  81 mg Oral Daily  . carvedilol  12.5 mg Oral BID  . docusate sodium  100 mg Oral BID  . enoxaparin  40 mg Subcutaneous QHS  . furosemide  40 mg Intravenous Q8H  . ipratropium  0.5 mg Nebulization Once  . losartan  50 mg Oral Daily  . loteprednol  1 drop Right Eye QID  . potassium chloride  40 mEq Oral Q4H  . potassium chloride  40 mEq Oral Once  . simvastatin  20 mg Oral QHS  . spironolactone  12.5 mg Oral Daily  . Travoprost (BAK Free)  1 drop Right Eye QHS  . DISCONTD: furosemide  40 mg Intravenous BID  . DISCONTD: potassium chloride  40 mEq Oral Once   Continuous Infusions:  PRN Meds:.acetaminophen, chlorpheniramine-HYDROcodone, guaiFENesin-dextromethorphan, ondansetron (ZOFRAN) IV Assessment/Plan: 1. Syncope: unclear etiology. Patient is s/p catheterization day 2. 2D echo shows EF 30-35% with regional  wall abnormality with critical stenosis of aortic valve. Overnight, he sinus bradycardia but symptomatic. Cardiology is planned for PCI in AM? --Continue Coreg 12.5mg  BID  --Appreciate cardiology's input  -NPO after midnight  2. CAD- s/p heart cath. Please see #1. Continue ASA and BB, statin   2. Systolic CHF: Improving clinically although he still has crackles on lung exams.  Net negative a little yesterday.   Negative 4 kilograms in weight since admission.  --Will continue diuresis at current dose of lasix 40mg  IV q8hr --continue to check daily weight and daily in and outs.   3. Essential hypertension: BP is adequately controlled  -- Continue Lasix, Amlodipine, Coreg, and Losartan   4. Hypokalemia: K 3.4. Potassium supplemented with KDur x1 doses. Repeat BMP in AM  5. Cough: Likely viral x2 days in duration. Other ddx would be GERD.  CXR on 1/23 showes cardiomegaly and vascular congestion without acute disease, without any infiltrates.  He has  been afebrile, and no leukocytosis; therefore, antibiotics not indicated. Repeat CXR today. If no improvement, will try trial of PPI -Will continue symptoms treatment   LOS: 4 days   Jenavieve Freda 11/04/2011, 11:03 AM

## 2011-11-04 NOTE — Progress Notes (Signed)
Placed pt. On CPAP auto titrate via nasal mask with 3L O2 bled in. Pt. Is tolerating CPAP well at this time.

## 2011-11-04 NOTE — Progress Notes (Signed)
Patient ID: Eugene [name], male   DOB: 07/27/1924, 78 y.o.   MRN: [id number] SUBJECTIVE: The patient feels relatively well. It is my understanding from these discussions that have occurred at the first step will be to try to proceed with PCI tomorrow. He's not actively listed on the cath board yet. I will put him on the add-on list. However there will be further discussion early in the morning to finalize the exact plan. He has significant coronary disease and valvular disease. He's not having any chest pain. His diuretics were increased yesterday. There was a slight diuresis.   Filed Vitals:   11/03/11 1057 11/03/11 1451 11/03/11 2059 11/04/11 0510  BP: 140/80 118/69 130/73 124/77  Pulse: 68 49 59 56  Temp:  99 F (37.2 C) 98 F (36.7 C) 97.3 F (36.3 C)  TempSrc:  Oral Oral Oral  Resp: 18 19 18 20   Height:      Weight:    203 lb 0.7 oz (92.1 kg)  SpO2:  93% 93% 91%    Intake/Output Summary (Last 24 hours) at 11/04/11 1029 Last data filed at 11/04/11 0622  Gross per 24 hour  Intake    988 ml  Output   1501 ml  Net   -513 ml    LABS: Basic Metabolic Panel:  Basename 11/04/11 0550 11/03/11 1520  NA 137 136  K 3.4* 3.4*  CL 101 102  CO2 23 24  GLUCOSE 91 134*  BUN 16 13  CREATININE 0.76 0.74  CALCIUM 8.8 8.4  MG -- --  PHOS -- --   Liver Function Tests: No results found for this basename: AST:2,ALT:2,ALKPHOS:2,BILITOT:2,PROT:2,ALBUMIN:2 in the last 72 hours No results found for this basename: LIPASE:2,AMYLASE:2 in the last 72 hours CBC:  Basename 11/04/11 0550 11/02/11 1615  WBC 7.7 7.7  NEUTROABS -- --  HGB 14.0 14.7  HCT 41.1 42.8  MCV 87.8 89.5  PLT 153 136*   Cardiac Enzymes:  Basename 11/01/11 1552  CKTOTAL 114  CKMB 2.6  CKMBINDEX --  TROPONINI <0.30   BNP: No components found with this basename: POCBNP:3 D-Dimer: No results found for this basename: DDIMER:2 in the last 72 hours Hemoglobin A1C: No results found for this basename: HGBA1C in the  last 72 hours Fasting Lipid Panel: No results found for this basename: CHOL,HDL,LDLCALC,TRIG,CHOLHDL,LDLDIRECT in the last 72 hours Thyroid Function Tests: No results found for this basename: TSH,T4TOTAL,FREET3,T3FREE,THYROIDAB in the last 72 hours  RADIOLOGY: Dg Chest 2 View  10/31/2011  *RADIOLOGY REPORT*  Clinical Data: Status post fall with a blow to the back of the head.  CHEST - 2 VIEW  Comparison: Plain films of the chest 04/13/2011.  Findings: There is cardiomegaly with some pulmonary vascular congestion.  No focal airspace disease or pleural effusion.  No pneumothorax.  Remote right humerus fracture noted.  IMPRESSION: Cardiomegaly and vascular congestion without acute disease.  Original Report Authenticated By: Bernadene Bell. Maricela Curet, M.D.   Ct Head Wo Contrast  10/31/2011  *RADIOLOGY REPORT*  Clinical Data: Fall going up steps.  Amnestic of the incident. Abrasion between eyebrows.  The large hematoma in the right parietal scalp.  CT HEAD WITHOUT CONTRAST  Technique:  Contiguous axial images were obtained from the base of the skull through the vertex without contrast.  Comparison: CT head without contrast 09/12/2009.  Findings: A large right parietal scalp hematoma is present. Although there is some motion in this area, there is no underlying fracture. Minimal soft tissue swelling is noted of the  glabella. There is no underlying fracture.  No other significant extracranial injury is evident.  The study is mildly degraded by motion.  Atrophy and moderate white matter change is stable.  No acute cortical infarct, hemorrhage, or mass lesion is present.  Dolichoectasia of the basilar artery is stable.  The ventricles are proportionate to the degree of atrophy. No significant extra-axial fluid collection is present.  The paranasal sinuses and mastoid air cells are clear.  A metallic scleral band of the lateral aspect of the right globe is stable.  IMPRESSION:  1.  The large right parietal scalp hematoma  without underlying fracture. 2.  Minimal soft tissue swelling above the glabella without underlying fracture. 3.  Stable atrophy and white matter disease. 4.  No acute intracranial abnormality. 5.  Atherosclerosis.  Original Report Authenticated By: Jamesetta Orleans. MATTERN, M.D.    PHYSICAL EXAM The patient seems to be fully oriented. Lungs reveal scattered rhonchi. There is no respiratory distress. His murmur is really quite distant despite his severe aortic valve disease. The abdomen is soft. There is trace peripheral edema.   TELEMETRY: I personally reviewed to telemetry. There is sinus rhythm. In fact he has had some marked sinus bradycardia at times.   ASSESSMENT AND PLAN:  Principal Problem:   *Syncope and collapse  The patient has significant coronary disease and aortic stenosis. It is my understanding that the next step is to proceed with coronary PCI tomorrow. I will be sure there is further discussion at the early morning meeting tomorrow. He'll be sure the patient is listed on the cath board.  He will be made n.p.o. There is sinus bradycardia now. I do not know if tachybradycardia has been considered as part of the etiology of his syncope. Certainly his rhythm will have to be followed carefully as the other issues are treated.  Active Problems:  HYPERLIPIDEMIA  Essential hypertension, benign   Congestive heart failure   The patient's diuretics were increased yesterday. He had very slight diuresis. Renal function is stable. I am hesitant to push his diuretics any harder with plans for cath tomorrow.   SVT (supraventricular tachycardia) The current rhythm is sinus. He has had some bradycardia.   CAD (coronary artery disease)   Aortic stenosis It is felt that ultimately the patient may need in advance procedure for his aortic valve, the first his coronaries will be approached.  Hypokalemia Potassium is 3.4 today. He will receive extra potassium.    Willa Rough 11/04/2011  10:29 AM

## 2011-11-04 NOTE — Progress Notes (Addendum)
I was called for bradycardia for this patient, now for the past 90 minutes.  Variable bradycardia, with rates as low as 40 on monitor.  Patient sleeping comfortably with nasal CPAP mask on with only oxygen flowing through, CPAP machine off per nursing.  Upon wakening patient denies chest pain, SOB, dizzyness/lightheadedness.  Exam:  Diffusely rhonchorous breath sounds Heart sounds are difficult to appreciate at this time  On Tele there appear to be some early beats making the rhythm somewhat irregular, but there appear to be p waves before QRS. EKG: Rate 53, 1st degree AV block, likely PAC per my read unchanged from previous except for rate  [facility] on call Dr. Gala Romney notified of this change in patient's condition as his group has been following patient for his heart care during this hospitalization.  I discussed patient and patient's EKGs with Dr. Gala Romney.  No change in management at this time.  Will watch patient closely overnight.

## 2011-11-05 ENCOUNTER — Encounter (HOSPITAL_COMMUNITY): Payer: Self-pay

## 2011-11-05 DIAGNOSIS — I5020 Unspecified systolic (congestive) heart failure: Secondary | ICD-10-CM

## 2011-11-05 DIAGNOSIS — I2510 Atherosclerotic heart disease of native coronary artery without angina pectoris: Secondary | ICD-10-CM

## 2011-11-05 DIAGNOSIS — I359 Nonrheumatic aortic valve disorder, unspecified: Secondary | ICD-10-CM

## 2011-11-05 LAB — BASIC METABOLIC PANEL
CO2: 26 mEq/L (ref 19–32)
Chloride: 99 mEq/L (ref 96–112)
Creatinine, Ser: 0.84 mg/dL (ref 0.50–1.35)
GFR calc Af Amer: 90 mL/min — ABNORMAL LOW (ref >90)
Potassium: 3.3 mEq/L — ABNORMAL LOW (ref 3.5–5.1)

## 2011-11-05 LAB — CBC
HCT: 41.3 % (ref 39.0–52.0)
Hemoglobin: 14 g/dL (ref 13.0–17.0)
MCV: 87.7 fL (ref 78.0–100.0)
RBC: 4.71 MIL/uL (ref 4.22–5.81)
WBC: 9.6 10*3/uL (ref 4.0–10.5)

## 2011-11-05 MED ORDER — PANTOPRAZOLE SODIUM 40 MG PO TBEC
40.0000 mg | DELAYED_RELEASE_TABLET | Freq: Every day | ORAL | Status: DC
  Administered 2011-11-05 – 2011-11-15 (×10): 40 mg via ORAL
  Filled 2011-11-05 (×10): qty 1

## 2011-11-05 MED ORDER — POTASSIUM CHLORIDE 10 MEQ/100ML IV SOLN
10.0000 meq | INTRAVENOUS | Status: DC
  Administered 2011-11-05: 10 meq via INTRAVENOUS
  Filled 2011-11-05 (×3): qty 100

## 2011-11-05 MED ORDER — POTASSIUM CHLORIDE CRYS ER 20 MEQ PO TBCR
40.0000 meq | EXTENDED_RELEASE_TABLET | Freq: Two times a day (BID) | ORAL | Status: AC
  Administered 2011-11-05 (×2): 40 meq via ORAL
  Filled 2011-11-05 (×2): qty 2

## 2011-11-05 NOTE — Progress Notes (Signed)
Placed pt. On CPAP, via nasal mask, with 4 lpm O2 bleed in. Pt. Tolerating well at this time.

## 2011-11-05 NOTE — Progress Notes (Signed)
TCTS BRIEF PROGRESS NOTE   Using the STS risk calculator the patient's expected risks for elective AVR + CABG would include a roughly 7.5% risk of mortality 36% risk of morbidity 2.7% risk of stroke (assuming no critical disease on pending carotid duplex) 24% risk of prolonged ventilation (assuming mild chronic lung disease - PFT's pending) and 21% risk of prolonged hospitalization.  Eugene [name] 11/05/2011 9:13 PM

## 2011-11-05 NOTE — Progress Notes (Signed)
Subjective:  No chest pain or dyspnea at rest. No complaints today.  Objective:  Vital Signs in the last 24 hours: Temp:  [97 F (36.1 C)-98.8 F (37.1 C)] 98.8 F (37.1 C) (01/28 0437) Pulse Rate:  [48-54] 48  (01/28 0437) Resp:  [18-22] 20  (01/28 0437) BP: (108-166)/(57-70) 108/66 mmHg (01/28 0437) SpO2:  [93 %-95 %] 95 % (01/28 0437) FiO2 (%):  [4 %] 4 % (01/27 1900) Weight:  [91.8 kg (202 lb 6.1 oz)] 91.8 kg (202 lb 6.1 oz) (01/28 0437)  Intake/Output from previous day: 01/27 0701 - 01/28 0700 In: 1574 [P.O.:1560; I.V.:6; IV Piggyback:8] Out: 2950 [Urine:2950]  Physical Exam: Pt is alert and oriented, elderly male in NAD HEENT: normal Neck: JVP moderately elevated Lungs: CTA  CV: RRR with 3/6 harsh systolic murmur throughout, distant heart sounds Abd: soft, NT, Positive BS, no hepatomegaly Ext: no C/C/E, distal pulses intact and equal Skin: warm/dry no rash   Lab Results:  Basename 11/05/11 0545 11/04/11 0550  WBC 9.6 7.7  HGB 14.0 14.0  PLT 157 153    Basename 11/05/11 0545 11/04/11 0550  NA 137 137  K 3.3* 3.4*  CL 99 101  CO2 26 23  GLUCOSE 104* 91  BUN 17 16  CREATININE 0.84 0.76   No results found for this basename: TROPONINI:2,CK,MB:2 in the last 72 hours  Assessment/Plan:  1. Severe Aortic Stenosis 2. Severe LV Dysfunction LVEF 30-35% 3. Multivessel CAD with severe proximal LAD stenosis 4. Acute on Chronic Systolic Heart Failure 5. Syncope  This patient presents with a complex cardiac problem with problems as above. I have reviewed his echo and cath films and agree that he clearly has severe/critical AS and severe complex proximal LAD stenosis. He is somewhat frail and lives alone. I do not think that he is a candidate for open AVR/CABG, but would like to ask cardiac surgery to see him for an opinion. If he is deemed not to be a candidate for open surgery, I would recommend consideration of balloon aortic valvuloplasty followed by PCI of the  proximal LAD. Will reassess the patient after seen by cardiac surgery.  Tonny Bollman, M.D. 11/05/2011, 10:02 AM

## 2011-11-05 NOTE — Consults (Signed)
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 The patient's cardiac history dates back to 101 when he suffered an acute myocardial infarction. He was treated with primary angioplasty. The patient has never had any further recurrence of symptomatic coronary artery disease nor followup cardiac catheterization. The patient has had known history of aortic stenosis with previous echocardiogram from 2010 demonstrating mild aortic stenosis  (AVA estimated 1.64cm2, mean gradient 16 mmHg) and ejection fraction 50-55%.  Over the last year he has been seen with episodes of supraventricular tachycardia which were reportedly asymptomatic and had been treated medically.  The patient suffered a sudden syncopal episode January 23 wall here was carrying in grocery from the car to his home. He fell and hit his head and was unconscious for at least 20 minutes. A friend was present and summoned EMS. The patient was brought directly to the emergency room where he arrived in sinus rhythm.  He ruled out for acute myocardial infarction. Echocardiogram demonstrates severe aortic stenosis with moderate to severe left ventricular dysfunction. Left heart catheterization was performed demonstrating severe three-vessel coronary artery disease. The gradient across the aortic valve was not measured at catheterization, and right heart catheterization was not performed. Cardiothoracic surgical consultation has been requested to assess whether or not the patient should be considered candidate for aortic valve replacement and coronary artery bypass grafting versus possible less invasive management of his aortic  stenosis and coronary artery disease versus palliative medical therapy.  Past Medical History  Diagnosis Date  . Glaucoma     Right eye  . Diverticulosis   . Macular degeneration of right eye   . Allergic rhinitis   . CAD (coronary artery disease)     status post percutaneous transluminal coronary angioplasty in 1991.  . OSA on CPAP   . Hypertension   . Hyperlipidemia   . Chronic CHF (congestive heart failure)     ejection fraction of 40-45%, grade 2 diastolic  dysfunction, and mild aortic stenosis  . Supraventricular tachycardia     2 syndromes-nonsustained atrial tachycardia//adenosine responsive diuretic positive reentry probably AV node reentry  . Shortness of breath   . Sleep apnea     uses cpap  . GI bleed     hx of GI Bleed    Past Surgical History  Procedure Date  . Implantation of [place] glaucoma device lant with scleral reinforcement using tutoplast tissue graft right eye.   . Coronary angioplasty with stent placement   . Corneal transplant     History reviewed. No pertinent family history.  Social History History  Substance Use Topics  . Smoking status: Former Smoker    Types: Cigarettes    Quit date: 10/08/1960  . Smokeless tobacco: Never Used  . Alcohol Use: No    Prior to Admission medications   Medication Sig Start Date End Date Taking? Authorizing Provider  amLODipine (NORVASC) 5 MG tablet Take 10 mg by mouth daily. 07/10/11 07/09/12 Yes Deatra Robinson, MD  aspirin 81 MG tablet Take 81 mg by mouth daily.     Yes Historical Provider, MD  carvedilol (COREG) 6.25 MG tablet Take 6.25 mg by mouth 2 (two) times daily. 05/03/11 05/02/12  Yes Melida Quitter, MD  docusate sodium (COLACE) 100 MG capsule Take 100 mg by mouth 2 (two) times daily.     Yes Historical Provider, MD  furosemide (LASIX) 20 MG tablet Take 20 mg by mouth daily. 07/24/11 07/23/12 Yes Deatra Robinson, MD  furosemide (LASIX) 40 MG tablet Take 40-80 mg by mouth 2 (two) times daily. Take 2  tablets (80 MG) in the morning, and 1 tablet (40 MG) in the evening.   Yes Historical Provider, MD  losartan (COZAAR) 50 MG tablet Take 50 mg by mouth daily. 07/10/11  Yes Deatra Robinson, MD  loteprednol (LOTEMAX) 0.5 % ophthalmic suspension Place 1 drop into the right eye 4 (four) times daily.     Yes Historical Provider, MD  simvastatin (ZOCOR) 40 MG tablet Take 40 mg by mouth at bedtime. 05/17/11  Yes Rocco Serene, MD  travoprost, benzalkonium, (TRAVATAN) 0.004 % ophthalmic solution Place 1 drop into the right eye at bedtime.     Yes Historical Provider, MD    Current Facility-Administered Medications  Medication Dose Route Frequency Provider Last Rate Last Dose  . acetaminophen (TYLENOL) tablet 650 mg  650 mg Oral Q6H PRN Maren Reamer, NP   650 mg at 11/05/11 1221  . albuterol (PROVENTIL) (5 MG/ML) 0.5% nebulizer solution 2.5 mg  2.5 mg Nebulization Once Carleene Cooper III, MD      . albuterol (PROVENTIL) (5 MG/ML) 0.5% nebulizer solution 2.5 mg  2.5 mg Nebulization Once Carleene Cooper III, MD      . amLODipine (NORVASC) tablet 10 mg  10 mg Oral Daily Houston Siren, MD   10 mg at 11/05/11 1011  . aspirin EC tablet 81 mg  81 mg Oral Daily Clint Lipps, MD   81 mg at 11/05/11 1011  . carvedilol (COREG) tablet 12.5 mg  12.5 mg Oral BID Darnelle Maffucci, MD   12.5 mg at 11/05/11 1005  . chlorpheniramine-HYDROcodone (TUSSIONEX) 10-8 MG/5ML suspension 5 mL  5 mL Oral Q12H PRN Carrolyn Meiers, MD   5 mL at 11/04/11 1829  . docusate sodium (COLACE) capsule 100 mg  100 mg Oral BID Houston Siren, MD   100 mg at 11/05/11 1011  . enoxaparin (LOVENOX) injection 40 mg  40 mg Subcutaneous QHS Houston Siren, MD   40 mg at 11/04/11 2242  . furosemide (LASIX) injection 40 mg  40 mg Intravenous Q8H Marca Ancona, MD   40 mg at 11/05/11 1430  . guaiFENesin-dextromethorphan (ROBITUSSIN DM) 100-10 MG/5ML syrup 15 mL  15 mL Oral Q4H PRN Rollene Rotunda, MD   15 mL at 11/04/11 1422  . ipratropium (ATROVENT) nebulizer solution 0.5 mg  0.5  mg Nebulization Once Carleene Cooper III, MD      . losartan (COZAAR) tablet 50 mg  50 mg Oral Daily Houston Siren, MD   50 mg at 11/05/11 1015  . loteprednol (LOTEMAX) 0.5 % ophthalmic suspension 1 drop  1 drop Right Eye QID Houston Siren, MD   1 drop at 11/05/11 1715  . ondansetron (ZOFRAN) injection 4 mg  4 mg Intravenous Q6H PRN Rollene Rotunda, MD      . pantoprazole (PROTONIX) EC tablet 40 mg  40 mg Oral Q1200 Vernice Jefferson, MD   40 mg at 11/05/11 1221  . potassium chloride SA (K-DUR,KLOR-CON) CR tablet 40 mEq  40 mEq Oral BID Vernice Jefferson, MD   40 mEq at 11/05/11 1221  . simvastatin (ZOCOR) tablet 20 mg  20 mg Oral QHS Zoila Shutter, MD  20 mg at 11/04/11 2242  . spironolactone (ALDACTONE) tablet 12.5 mg  12.5 mg Oral Daily Marca Ancona, MD   12.5 mg at 11/05/11 1015  . Travoprost (BAK Free) (TRAVATAN) 0.004 % ophthalmic solution SOLN 1 drop  1 drop Right Eye QHS Houston Siren, MD   1 drop at 11/04/11 2243  . DISCONTD: potassium chloride 10 mEq in 100 mL IVPB  10 mEq Intravenous Q1 Hr x 3 Vernice Jefferson, MD   10 mEq at 11/05/11 0958    Allergies  Allergen Reactions  . Penicillins     REACTION: "turns red"    Review of Systems:  General:  normal appetite, normal energy   Respiratory:  + cough just over last few days but non-productive, no wheezing, no hemoptysis, no pain with inspiration or cough, no shortness of breath   Cardiac:   no chest pain or tightness, no exertional SOB, no resting SOB, no PND, no orthopnea, no LE edema, no palpitations, no previous episodes of syncope nor dizzy spells  GI:   no difficulty swallowing, + hematochezia in past with negative colonoscopy, no hematemesis, no melena, no constipation, no diarrhea   GU:   no dysuria, no urgency, no frequency   Musculoskeletal: no arthritis, no arthralgia   Vascular:  no pain suggestive of claudication   Neuro:   no symptoms suggestive of TIA's, no seizures, no headaches, no peripheral neuropathy   Endocrine:  Negative    HEENT:  no loose teeth or painful teeth - sees his dentist twice annually,  no recent vision changes  Psych:   no anxiety, no depression    Physical Exam:   BP 110/66  Pulse 66  Temp(Src) 98 F (36.7 C) (Axillary)  Resp 20  Ht 5\' 7"  (1.702 m)  Wt 91.8 kg (202 lb 6.1 oz)  BMI 31.70 kg/m2  SpO2 95%  General:  Elderly, obese,  frail-appearing  HEENT:  Unremarkable   Neck:   no JVD, no bruits, no adenopathy   Chest:   Few scattered crackles on auscultation, symmetrical breath sounds, no wheezes, no rhonchi   CV:   RRR, faint systolic murmur   Abdomen:  Obese, soft, non-tender, no masses   Extremities:  warm, well-perfused, pulses not palpable, mild edema  Rectal/GU  Deferred  Neuro:   Grossly non-focal and symmetrical throughout  Skin:   Clean and dry, no rashes, no breakdown    Diagnostic Tests:  CT HEAD WITHOUT CONTRAST 10/31/2011  Technique: Contiguous axial images were obtained from the base of  the skull through the vertex without contrast.  Comparison: CT head without contrast 09/12/2009.  Findings: A large right parietal scalp hematoma is present.  Although there is some motion in this area, there is no underlying  fracture. Minimal soft tissue swelling is noted of the glabella.  There is no underlying fracture. No other significant extracranial  injury is evident.  The study is mildly degraded by motion. Atrophy and moderate white  matter change is stable. No acute cortical infarct, hemorrhage, or  mass lesion is present. Dolichoectasia of the basilar artery is  stable. The ventricles are proportionate to the degree of atrophy.  No significant extra-axial fluid collection is present.  The paranasal sinuses and mastoid air cells are clear. A metallic  scleral band of the lateral aspect of the right globe is stable.  IMPRESSION:  1. The large right parietal scalp hematoma without underlying  fracture.  2. Minimal soft tissue swelling above the glabella without  underlying fracture.  3. Stable atrophy and white matter disease.  4. No acute intracranial abnormality.  5. Atherosclerosis.   Transthoracic Echocardiography 11/02/2011 ------------------------------------------------------------ LV EF: 30% -   35% ------------------------------------------------------------ Indications:      Syncope 780.2. ------------------------------------------------------------ ---------------------------------- Study Conclusions  - Left ventricle: The cavity size was severely dilated. Wall   thickness was increased in a pattern of mild LVH. Systolic   function was moderately to severely reduced. The estimated   ejection fraction was in the range of 30% to 35%. Doppler   parameters are consistent with elevated ventricular   end-diastolic filling pressure. - Regional wall motion abnormality: Akinesis and scarring of   the basal-mid inferolateral myocardium; akinesis of the   basal-mid anterolateral and apical lateral myocardium;   hypokinesis of the basal-mid inferoseptal, entire   inferior, and apical septal myocardium; mild hypokinesis   of the entire anterior, basal-mid anteroseptal, and apical   myocardium. - Aortic valve: There was critical stenosis. Trivial   regurgitation. Peak velocity: 410cm/s (S). Valve area:   0.6cm^2(VTI). Valve area: 0.62cm^2 (Vmax). - Mitral valve: Calcified annulus. Mild regurgitation. - Left atrium: The atrium was severely dilated. ------------------------------------------------------------  ------------------------------------------------------------ Left ventricle:  The cavity size was severely dilated. Wall thickness was increased in a pattern of mild LVH. Systolic function was moderately to severely reduced. The estimated ejection fraction was in the range of 30% to 35%.  Regional wall motion abnormalities:  Akinesis and scarring of the basal-mid inferolateral myocardium; akinesis of the basal-mid anterolateral  and apical lateral myocardium; hypokinesis of the basal-mid inferoseptal, entire inferior, and apical septal myocardium; mild hypokinesis of the entire anterior, basal-mid anteroseptal, and apical myocardium. The tissue Doppler parameters were abnormal. Doppler parameters are consistent with elevated ventricular end-diastolic filling pressure.  ------------------------------------------------------------ Aortic valve:   Moderately calcified leaflets.  Doppler: There was critical stenosis.    Trivial regurgitation. VTI ratio of LVOT to aortic valve: 0.19. Valve area: 0.6cm^2(VTI). Indexed valve area: 0.29cm^2/m^2 (VTI). Peak velocity ratio of LVOT to aortic valve: 0.2. Valve area: 0.62cm^2 (Vmax). Indexed valve area: 0.3cm^2/m^2 (Vmax). Mean gradient: 45mm Hg (S). Peak gradient: 67mm Hg (S). ------------------------------------------------------------ Aorta:  The aorta was mildly dilated. ------------------------------------------------------------ Mitral valve:   Calcified annulus.  Doppler:   Mild regurgitation.    Peak gradient: 6mm Hg (D). ----------------------------------------------------------- Left atrium:  The atrium was severely dilated. ------------------------------------------------------------ Right ventricle:  The cavity size was normal. Wall thickness was normal. Systolic function was normal. ------------------------------------------------------------ Pulmonic valve:    Structurally normal valve.   Cusp separation was normal.  Doppler:  Transvalvular velocity was within the normal range.  No regurgitation. ------------------------------------------------------------ Tricuspid valve:   Mildly thickened leaflets.  Doppler: Mild regurgitation. ------------------------------------------------------------ Right atrium:  The atrium was normal in size. ------------------------------------------------------------ Pericardium:  The pericardium was normal in  appearance. ------------------------------------------------------------ Systemic veins: Inferior vena cava: The vessel was normal in size; the respirophasic diameter changes were in the normal range (= 50%); findings are consistent with normal central venous pressure. ------------------------------------------------------------ ------  Aortic valve Peak vel,   410 cm/s   ------ Mean vel,   320 cm/s   ------                                                                VTI, S  117 cm     ------                                Mean         45 mm Hg  ------                                gradient,                                S                                Peak         67 mm Hg  ------                                gradient,                                S                                VTI ratio  0.19        ------                                LVOT/AV                                Area, VTI   0.6 cm^2   ------                                Area index 0.29 cm^2/m ------                                (VTI)           ^2                                Peak vel    0.2        ------                                ratio,                                LVOT/AV                                Area, Vmax 0.62 cm^2   ------                                Area index  0.3 cm^2/m ------                                (Vmax)          ^2                                Regurg PHT  610 ms     ------     Left Heart Catheterization  11/02/2011             Hemodynamics:                                      AO 128/69                                     LV  NA    Coronary angiography:  Coronary dominance: Right Left mainstem:   Luminal irregularities Left anterior descending (LAD):   Ostial 95% stenosis. There is an aneurysmal dilatation immediately following this. The LAD otherwise has diffuse nonobstructive plaque. The LAD does feed collaterals to an occluded large posterior lateral.  Mid diagonal is small to moderate sized and free of high-grade disease. Ramus intermedius:  Very large vessel with mild luminal irregularities. Left circumflex (LCx):  Circumflex in the AV groove happens proximal luminal irregularities. There is a branching mid obtuse marginal. There appears to be a proximal 70% stenosis prior to bifurcation. The remainder of the vessel is free of high-grade disease although there is a superior branch 30-40% stenosis. Right coronary artery (RCA):  Severe diffuse disease. There is proximal 75 followed by 90% stenosis. There is diffuse mid heavy calcification with tandem focal 50% lesions. There is a long 90% stenosis before a small PDA. The vessel is then occluded before the large posterior lateral with collaterals as described. Left ventriculography: I was unable to cross the aortic valve.    Impression:  Patient is a somewhat frail-appearing 78 year old gentleman who lives alone and yet remains functionally independent who presents with a syncopal episode and has severe aortic stenosis with severe three-vessel coronary artery disease and moderate to severe left ventricular dysfunction. The patient denies significant symptoms of chest discomfort or shortness of breath, although he clearly suffers from chronic congestive heart failure to some degree in his physical activity seems limited at baseline. Although details related to his syncopal episode remain unclear, I feel that we must presume the etiology was cardiac in related to combination of severe aortic stenosis and coronary artery disease with or without an associated arrhythmia. Risks associated with aortic valve replacement and coronary artery bypass grafting would be high. Long-term prognosis without some type of intervention for aortic stenosis and coronary artery disease would be poor.  Plan:  I discussed options briefly with the patient in his hospital room this evening including continued medical therapy  (which would be largely palliative) versus high-risk aortic valve replacement and coronary artery bypass grafting versus possible less invasive means of treating both the aortic stenosis and coronary artery disease such as percutaneous coronary retention and transcatheter aortic valve replacement. The severity of aortic stenosis and the complex anatomy associated with critical proximal left anterior descending coronary artery stenosis seen at catheterization further complicate the decision  process. I suggested that the patient think matters over further and discuss options with his family. Pulmonary function tests, carotid duplex scan, physical therapy consult, and possibly 6 minute walk test might be helpful to further stratify his risk. All of his questions have been addressed.    Salvatore Decent. Cornelius Moras, MD 11/05/2011 6:59 PM

## 2011-11-05 NOTE — Progress Notes (Signed)
Internal Medicine Attending  Date: 11/05/2011  Patient name: Eugene [name] Medical record number: [id number] Date of birth: [birth year] Age: 78 y.o. Gender: male  I saw and evaluated the patient. I reviewed the resident's note by Dr. Milbert Coulter and I agree with the resident's findings and plans as documented in her note.

## 2011-11-05 NOTE — Progress Notes (Signed)
Subjective: No acute events overnight.  Has no complaints, except not eating much (currently pt is also NPO for PCI).  Has had some bleeding from hematoma on scalp, no mental status changes.  Denies CP/SOB/palpitations.  Occasional dizziness.  Does have cough with white sputum production since yesterday.  Objective: Vital signs in last 24 hours: Has remained sinus brady without symptoms Filed Vitals:   11/04/11 1527 11/04/11 1900 11/05/11 0212 11/05/11 0437  BP: 111/66 144/70  108/66  Pulse: 50 50 52 48  Temp: 97 F (36.1 C) 97.9 F (36.6 C)  98.8 F (37.1 C)  TempSrc: Tympanic Oral  Axillary  Resp: 19 22 20 20   Height:      Weight:    202 lb 6.1 oz (91.8 kg)  SpO2: 93% 94% 95% 95%   Weight change: -10.6 oz (-0.3 kg)  Intake/Output Summary (Last 24 hours) at 11/05/11 0714 Last data filed at 11/05/11 0400  Gross per 24 hour  Intake   1440 ml  Output   2950 ml  Net  -1510 ml   Physical Exam: General: resting in bed, NAD, cooperative to exam HEENT: PERRL, EOMI Cardiac: sinus bradycardic, no rubs, murmurs or gallops appreciable Pulm: minimal coarse bibasilar breath sounds, moving normal volumes of air, no respiratory distress Abd: soft, nontender, nondistended, BS normoactive Ext: warm and well perfused, no pedal edema Neuro: alert and oriented X3, nonfocal  Lab Results: Basic Metabolic Panel:  Lab 11/05/11 1610 11/04/11 0550  NA 137 137  K 3.3* 3.4*  CL 99 101  CO2 26 23  GLUCOSE 104* 91  BUN 17 16  CREATININE 0.84 0.76  CALCIUM 8.9 8.8  MG -- --  PHOS -- --   CBC:  Lab 11/05/11 0545 11/04/11 0550 10/31/11 1549  WBC 9.6 7.7 --  NEUTROABS -- -- 9.3*  HGB 14.0 14.0 --  HCT 41.3 41.1 --  MCV 87.7 87.8 --  PLT 157 153 --   Cardiac Enzymes:  Lab 11/01/11 1552 11/01/11 0540 11/01/11 0009  CKTOTAL 114 97 103  CKMB 2.6 2.6 3.1  CKMBINDEX -- -- --  TROPONINI <0.30 <0.30 <0.30   BNP:  Lab 10/31/11 1652  PROBNP 1475.0*   Thyroid Function Tests:  Lab  11/01/11 0009  TSH 0.890  T4TOTAL --  FREET4 --  T3FREE --  THYROIDAB --   Coagulation:  Lab 11/04/11 1231 10/31/11 1549  LABPROT 14.1 13.5  INR 1.07 1.01   Studies/Results: Dg Chest 2 View  11/04/2011  *RADIOLOGY REPORT*  Clinical Data: Cough, short of breath  CHEST - 2 VIEW  Comparison: Chest radiograph 10/31/2011  Findings: Stable enlarged cardiac silhouette.  There is a left lower lobe atelectasis.  There is central venous pulmonary congestion.  No evidence of focal consolidation.  No pneumothorax.  IMPRESSION:  1.  Cardiomegaly and  left lower lobe atelectasis is similar prior. 2.  Central venous pulmonary congestion is similar to prior.  Original Report Authenticated By: Genevive Bi, M.D.   Medications: I have reviewed the patient's current medications. Scheduled Meds:   . albuterol  2.5 mg Nebulization Once  . albuterol  2.5 mg Nebulization Once  . amLODipine  10 mg Oral Daily  . aspirin EC  81 mg Oral Daily  . carvedilol  12.5 mg Oral BID  . docusate sodium  100 mg Oral BID  . enoxaparin  40 mg Subcutaneous QHS  . furosemide  40 mg Intravenous Q8H  . ipratropium  0.5 mg Nebulization Once  . losartan  50 mg Oral Daily  . loteprednol  1 drop Right Eye QID  . potassium chloride  40 mEq Oral Once  . simvastatin  20 mg Oral QHS  . spironolactone  12.5 mg Oral Daily  . Travoprost (BAK Free)  1 drop Right Eye QHS   Continuous Infusions:  PRN Meds:.acetaminophen, chlorpheniramine-HYDROcodone, guaiFENesin-dextromethorphan, ondansetron (ZOFRAN) IV, DISCONTD: chlorpheniramine-HYDROcodone  Assessment/Plan: # Syncope: May be mechanical vs related to critical aortic stenosis (initially thought to be related to SVT, but pt has never had syncope in the past). S/p catheterization day 3, which revealed stenosis of LAD (95% ostial stenosis) and RCA (90% stenosis). 2D echo shows EF 30-35% with regional wall abnormality with critical stenosis of aortic valve.  Remains in asymptomatic  sinus bradycardia.  Pt is NPO for PCI today --Continue Coreg 12.5mg  BID  --Appreciate cardiology's input   # CAD- s/p heart cath. Please see #1. Continue ASA and BB, statin   # Systolic CHF: Improving clinically although he still has coarse sounds on lung exams. 1.5L net negative.  --Will continue diuresis at current dose of lasix 40mg  IV q8hr  --continue to check daily weight and daily in and outs.   # Essential hypertension: BP is adequately controlled, though does become hypertensive occasionally  -- Continue Lasix, Amlodipine, Coreg, and Losartan   # Hypokalemia: K 3.3, even after PO supp yesterday, since pt is NPO, initially tried IV potassium as pt is NPO for PCI, but unable to tolerate; since pt is NPO except meds, will give 40 mEq    # Cough: Likely viral x2 days in duration. Other ddx would be GERD. CXR from yesterday suggests LLL atelectasis & unchanged pulm congestion but no focal consolidationon.  He remains afebrile, and no leukocytosis; therefore, antibiotics not indicated.   -Will continue symptoms treatment -trial of protonix after procedure   LOS: 5 days   KAPADIA, Kyshon Tolliver 11/05/2011, 7:14 AM

## 2011-11-05 NOTE — Progress Notes (Signed)
RT Note: Placed pt on CPAP 5cmH20 with nasal mask. Pt tolerating well, RT will monitor.

## 2011-11-06 ENCOUNTER — Inpatient Hospital Stay (HOSPITAL_COMMUNITY): Payer: Medicare Other

## 2011-11-06 ENCOUNTER — Other Ambulatory Visit: Payer: Self-pay

## 2011-11-06 ENCOUNTER — Telehealth: Payer: Self-pay | Admitting: Internal Medicine

## 2011-11-06 ENCOUNTER — Other Ambulatory Visit (HOSPITAL_COMMUNITY): Payer: Self-pay | Admitting: Radiology

## 2011-11-06 DIAGNOSIS — I359 Nonrheumatic aortic valve disorder, unspecified: Secondary | ICD-10-CM

## 2011-11-06 DIAGNOSIS — I2510 Atherosclerotic heart disease of native coronary artery without angina pectoris: Secondary | ICD-10-CM

## 2011-11-06 DIAGNOSIS — Z01810 Encounter for preprocedural cardiovascular examination: Secondary | ICD-10-CM

## 2011-11-06 LAB — BASIC METABOLIC PANEL
BUN: 27 mg/dL — ABNORMAL HIGH (ref 6–23)
CO2: 26 mEq/L (ref 19–32)
Chloride: 104 mEq/L (ref 96–112)
GFR calc non Af Amer: 75 mL/min — ABNORMAL LOW (ref >90)
Glucose, Bld: 108 mg/dL — ABNORMAL HIGH (ref 70–99)
Potassium: 3.8 mEq/L (ref 3.5–5.1)
Sodium: 142 mEq/L (ref 135–145)

## 2011-11-06 MED ORDER — ALBUTEROL SULFATE (5 MG/ML) 0.5% IN NEBU
2.5000 mg | INHALATION_SOLUTION | Freq: Once | RESPIRATORY_TRACT | Status: AC
  Administered 2011-11-06: 2.5 mg via RESPIRATORY_TRACT

## 2011-11-06 NOTE — Evaluation (Signed)
Physical Therapy Evaluation Patient Details Name: Eugene [name] MRN: [id number] DOB: 18-May-1924 Today's Date: 11/06/2011  Problem List:  Patient Active Problem List  Diagnoses  . HYPERLIPIDEMIA  . MACULAR DEGENERATION, RIGHT EYE  . GLAUCOMA, RIGHT EYE  . Essential hypertension, benign  . ALLERGIC RHINITIS DUE TO POLLEN  . DIVERTICULOSIS, COLON  . Chronic venous insufficiency  . Congestive heart failure  . Bradycardia, drug induced  . Blood in stool, frank  . SVT (supraventricular tachycardia)  . CAD (coronary artery disease)  . Shortness of breath  . Preventative health care  . Dizziness  . Syncope and collapse  . Aortic stenosis    Past Medical History:  Past Medical History  Diagnosis Date  . Glaucoma     Right eye  . Diverticulosis   . Macular degeneration of right eye   . Allergic rhinitis   . CAD (coronary artery disease)     status post percutaneous transluminal coronary angioplasty in 1991.  . OSA on CPAP   . Hypertension   . Hyperlipidemia   . Chronic CHF (congestive heart failure)     ejection fraction of 40-45%, grade 2 diastolic  dysfunction, and mild aortic stenosis  . Supraventricular tachycardia     2 syndromes-nonsustained atrial tachycardia//adenosine responsive diuretic positive reentry probably AV node reentry  . Shortness of breath   . Sleep apnea     uses cpap  . GI bleed     hx of GI Bleed   Past Surgical History:  Past Surgical History  Procedure Date  . Implantation of baerveldt glaucoma device lant with scleral reinforcement using tutoplast tissue graft right eye.   . Coronary angioplasty with stent placement   . Corneal transplant     PT Assessment/Plan/Recommendation PT Assessment Clinical Impression Statement: Pt with syncope, Aortic stenosis, CAD. Pt with significant balance deficits limiting independence and safety. Pt would benefit from increased supervision and assist for discharge. Will follow to maximize safety and balance  and decrease burden of care.  PT Recommendation/Assessment: Patient will need skilled PT in the acute care venue PT Problem List: Decreased cognition;Decreased knowledge of use of DME;Decreased balance;Decreased safety awareness Barriers to Discharge: Decreased caregiver support PT Therapy Diagnosis : Abnormality of gait PT Plan PT Frequency: Min 3X/week PT Treatment/Interventions: Gait training;DME instruction;Stair training;Functional mobility training;Therapeutic activities;Balance training;Patient/family education;Cognitive remediation PT Recommendation Recommendations for Other Services: OT consult Follow Up Recommendations: Supervision for mobility/OOB;Home health PT;Skilled nursing facility (SNF if supervision and assist at home unable to be arranged) Equipment Recommended: Defer to next venue PT Goals  Acute Rehab PT Goals PT Goal Formulation: With patient Time For Goal Achievement: 2 weeks Pt will go Supine/Side to Sit: Independently;with HOB 0 degrees PT Goal: Supine/Side to Sit - Progress: Goal set today Pt will go Sit to Supine/Side: Independently;with HOB 0 degrees PT Goal: Sit to Supine/Side - Progress: Goal set today Pt will go Sit to Stand: with modified independence PT Goal: Sit to Stand - Progress: Goal set today Pt will go Stand to Sit: with modified independence PT Goal: Stand to Sit - Progress: Goal set today Pt will Ambulate: >150 feet;with least restrictive assistive device;with supervision PT Goal: Ambulate - Progress: Goal set today Pt will Go Up / Down Stairs: 3-5 stairs;with least restrictive assistive device;with supervision PT Goal: Up/Down Stairs - Progress: Goal set today  PT Evaluation Precautions/Restrictions  Precautions Precautions: Fall Prior Functioning  Home Living Lives With: Alone Receives Help From: Personal care attendant (1-6 hours) Type of Home:  House Home Layout: Two level Alternate Level Stairs-Rails: Right Alternate Level  Stairs-Number of Steps: 1 Home Access: Stairs to enter Entrance Stairs-Rails: Right Entrance Stairs-Number of Steps: 5 Bathroom Shower/Tub: Engineer, manufacturing systems: Standard Home Adaptive Equipment: Grab bars in shower;Walker - rolling;Quad cane Prior Function Level of Independence: Independent with basic ADLs;Independent with transfers;Requires assistive device for independence;Independent with homemaking with ambulation Driving: Yes Vocation: Retired Comments: Pt has a Manufacturing engineer who handles bills but he does all housework and ADL independently per pt Cognition Cognition Arousal/Alertness: Awake/alert Overall Cognitive Status: Impaired Memory: Appears impaired Memory Deficits: Pt unable to recall instruction after given Orientation Level: Oriented X4 Safety/Judgement: Decreased safety judgement for tasks assessed Decreased Safety/Judgement: Decreased awareness of need for assistance Safety/Judgement - Other Comments: Pt with decreased awareness of need for assist secondary to balance deficits Sensation/Coordination Sensation Light Touch: Appears Intact Extremity Assessment RLE Assessment RLE Assessment: Within Functional Limits LLE Assessment LLE Assessment: Within Functional Limits Mobility (including Balance) Bed Mobility Bed Mobility: Yes Supine to Sit: 6: Modified independent (Device/Increase time);With rails;HOB flat Sitting - Scoot to Edge of Bed: 6: Modified independent (Device/Increase time) Transfers Transfers: Yes Sit to Stand: 5: Supervision;From bed;From toilet Sit to Stand Details (indicate cue type and reason): cueing for safety and hand placement Stand to Sit: To toilet;To chair/3-in-1;5: Supervision Ambulation/Gait Ambulation/Gait: Yes Ambulation/Gait Assistance: 4: Min assist Ambulation/Gait Assistance Details (indicate cue type and reason): minguard assist with constant cues to extend trunk and step into RW, cues for direction to return to  room Ambulation Distance (Feet): 400 Feet Assistive device: Rolling walker Gait Pattern: Trunk flexed;Decreased stride length Gait velocity: 400 ft in 6 min Stairs: No  Posture/Postural Control Posture/Postural Control: Postural limitations Postural Limitations: flexed trunk Balance Balance Assessed: Yes Static Sitting Balance Static Sitting - Level of Assistance: 6: Modified independent (Device/Increase time) Static Standing Balance Static Standing - Level of Assistance: 4: Min assist Exercise    End of Session PT - End of Session Equipment Utilized During Treatment: Gait belt Activity Tolerance: Patient tolerated treatment well Patient left: in chair;with call bell in reach Nurse Communication: Mobility status for transfers;Mobility status for ambulation General Behavior During Session: [facility] for tasks performed Cognition: Impaired  Delorse Lek 11/06/2011, 3:03 PM  Toney Sang, PT [phone]

## 2011-11-06 NOTE — Progress Notes (Signed)
*  PRELIMINARY RESULTS* Vascular Ultrasound Carotid Duplex (Doppler) has been completed.  Preliminary findings: Bilaterally no significant ICA stenosis with antegrade vertebral flow.  Farrel Demark RDMS 11/06/2011, 10:22 AM

## 2011-11-06 NOTE — Progress Notes (Signed)
   CARDIOTHORACIC SURGERY PROGRESS NOTE  4 Days Post-Op  S/P Procedure(s) (LRB): CORONARY ANGIOGRAM ()  Subjective: No complaints.  Objective: Vital signs in last 24 hours: Temp:  [97 F (36.1 C)-97.8 F (36.6 C)] 97 F (36.1 C) (01/29 1421) Pulse Rate:  [56-67] 63  (01/29 1450) Cardiac Rhythm:  [-] Heart block (01/29 0821) Resp:  [16-18] 16  (01/29 1421) BP: (121-128)/(59-76) 127/59 mmHg (01/29 1421) SpO2:  [94 %-97 %] 94 % (01/29 1450) Weight:  [89.086 kg (196 lb 6.4 oz)] 89.086 kg (196 lb 6.4 oz) (01/29 0500)  Physical Exam:  Rhythm:   sinus  Breath sounds: Bilateral crackles  Heart sounds:  RRR w/ systolic murmur  Incisions:  n/a  Abdomen:  soft  Extremities:  Adequately perfused   Intake/Output from previous day: 01/28 0701 - 01/29 0700 In: 548 [P.O.:540; IV Piggyback:8] Out: 2350 [Urine:2350] Intake/Output this shift: Total I/O In: 720 [P.O.:720] Out: 175 [Urine:175]  Lab Results:  Basename 11/05/11 0545 11/04/11 0550  WBC 9.6 7.7  HGB 14.0 14.0  HCT 41.3 41.1  PLT 157 153   BMET:  Basename 11/06/11 0535 11/05/11 0545  NA 142 137  K 3.8 3.3*  CL 104 99  CO2 26 26  GLUCOSE 108* 104*  BUN 27* 17  CREATININE 0.91 0.84  CALCIUM 9.7 8.9    CBG (last 3)  No results found for this basename: GLUCAP:3 in the last 72 hours PT/INR:   Basename 11/04/11 1231  LABPROT 14.1  INR 1.07    CXR:  N/A  Assessment/Plan: S/P Procedure(s) (LRB): CORONARY ANGIOGRAM ()  Results of PFT's reviewed and notable for mild obstruction but severe weakness, inability to measure diffusion capacity.  PT consult reviewed.  I spent 30 minutes with the patient and his daughter discussing options and we took the patient for a brief walk.  He is very weak and has unstable gait.  I think the degree of frailty is quite high and long term prognosis is not favorable no matter what is done about his AS and CAD.  I am convinced that AVR + CABG would probably be a mistake under the  current circumstances.  If he did well with BAV and PCI of LAD it's conceivable that he could later be reconsidered for high risk AVR + CABG or TAVR.  However, I am doubtful of the likelihood that he will do well and more suspicious that he might wind up permanently losing his independence and wind up in a SNF for the remainder of his life no matter how he is treated.  Eugene [name] H 11/06/2011 6:20 PM

## 2011-11-06 NOTE — Progress Notes (Signed)
Internal Medicine Attending  Date: 11/06/2011  Patient name: Eugene [name] Medical record number: [id number] Date of birth: 1924/02/09 Age: 78 y.o. Gender: male  I saw and evaluated the patient. I reviewed the resident's note by Dr. Milbert Coulter and I agree with the resident's findings and plans as documented in her note.  Patient and family are considering treatment options as outlined by cardiothoracic surgery and cardiology for aortic stenosis and coronary artery disease.

## 2011-11-06 NOTE — Progress Notes (Signed)
SUBJECTIVE:  He thinks that he is breathing and baseline.  He is having no chest pain.   PHYSICAL EXAM Filed Vitals:   11/05/11 1440 11/05/11 2145 11/05/11 2215 11/06/11 0500  BP: 110/66 121/68  128/63  Pulse: 66 59 60 56  Temp: 98 F (36.7 C) 97.8 F (36.6 C)  97.5 F (36.4 C)  TempSrc:  Oral  Oral  Resp:  18 18 18   Height:      Weight:    89.086 kg (196 lb 6.4 oz)  SpO2: 95% 94%  94%   General:  No distress HEENT:  Scalp hematoma and bruising unchanged Lungs:  Clear Heart:  RRR, systolic murmur unchanged Abdomen:  Positive bowel sounds, no rebound no guarding Extremities:  No edema Neuro:  Nonfocal  LABS: Lab Results  Component Value Date   CKTOTAL 114 11/01/2011   CKMB 2.6 11/01/2011   TROPONINI <0.30 11/01/2011   Results for orders placed during the hospital encounter of 10/31/11 (from the past 24 hour(s))  BASIC METABOLIC PANEL     Status: Abnormal   Collection Time   11/06/11  5:35 AM      Component Value Range   Sodium 142  135 - 145 (mEq/L)   Potassium 3.8  3.5 - 5.1 (mEq/L)   Chloride 104  96 - 112 (mEq/L)   CO2 26  19 - 32 (mEq/L)   Glucose, Bld 108 (*) 70 - 99 (mg/dL)   BUN 27 (*) 6 - 23 (mg/dL)   Creatinine, Ser 1.19  0.50 - 1.35 (mg/dL)   Calcium 9.7  8.4 - 14.7 (mg/dL)   GFR calc non Af Amer 75 (*) >90 (mL/min)   GFR calc Af Amer 87 (*) >90 (mL/min)    Intake/Output Summary (Last 24 hours) at 11/06/11 1055 Last data filed at 11/06/11 0827  Gross per 24 hour  Intake    968 ml  Output   1625 ml  Net   -657 ml    ASSESSMENT AND PLAN:    1) CAD (coronary artery disease): Cath with high grade disease.  I had a long discussion today with the patient and his healthcare power of attorney. We discussed the options for management of his coronary disease and his aortic stenosis. Surgery is high risk for morbidity. Medical management would be palliative. The most reasonable approach might be high risk percutaneous treatment of both conditions. I have  discussed this with Dr. Excell Seltzer. The patient would prefer percutaneous treatment.  For now I will continue medical management. I'll try to get him ambulatory with physical therapy. Of note, preoperative evaluation to further quantify risk is ongoing.  He has had PFTs.  Carotid Dopplers have been ordered.  2) Fall vs syncope: The actual etiology of this is not clear. He has had no further events. Management will be as above.  3) HYPERLIPIDEMIA: Continue current therapy   4) Aortic stenosis.  Critical stenosis.  Possible percutaneous valvuloplasty and eventual TAVR.    5) Ischemic cardiomyopathy:  He has some basilar crackles.  I will continue the IV Lasix today.  I will consider changing to PO Lasix in the AM.  Continue current beta blocker, ARB and other therapy.  6) Social:  I have not met his family.  He is apparently have some legal difficulties with his daughter who visited him today.  He has a Patent attorney of attorney who I have been updated and his is involved in his care.  The patient is very competent  to make his own decisions.     [name] [name] 11/06/2011 10:55 AM

## 2011-11-06 NOTE — Progress Notes (Signed)
Subjective: No acute events overnight.  Denies CP, SOB, heart palpitations, dizziness.  Slept well overnight.  Remembers discussion about heart surgery yesterday, but does not recall risks.  Daughter is at bedside.  Objective: Vital signs in last 24 hours: Filed Vitals:   11/05/11 1440 11/05/11 2145 11/05/11 2215 11/06/11 0500  BP: 110/66 121/68  128/63  Pulse: 66 59 60 56  Temp: 98 F (36.7 C) 97.8 F (36.6 C)  97.5 F (36.4 C)  TempSrc:  Oral  Oral  Resp:  18 18 18   Height:      Weight:    196 lb 6.4 oz (89.086 kg)  SpO2: 95% 94%  94%   Weight change: -5 lb 15.7 oz (-2.714 kg)  Intake/Output Summary (Last 24 hours) at 11/06/11 1122 Last data filed at 11/06/11 0827  Gross per 24 hour  Intake    968 ml  Output   1625 ml  Net   -657 ml   Physical Exam: General: resting in bed, NAD, cooperative to exam HEENT: PERRL, EOMI, no scleral icterus, no conjunctival pallor, stable hematoma on right side of scalp.  Cardiac: bradycardic, regular, no rubs, murmurs or gallops Pulm: clear to auscultation bilaterally, moving normal volumes of air Abd: soft, nontender, nondistended, BS normoactive Ext: warm and well perfused, no pedal edema Neuro: alert and oriented X3, cranial nerves II-XII grossly intact  Lab Results: Basic Metabolic Panel:  Lab 11/06/11 1610 11/05/11 0545  NA 142 137  K 3.8 3.3*  CL 104 99  CO2 26 26  GLUCOSE 108* 104*  BUN 27* 17  CREATININE 0.91 0.84  CALCIUM 9.7 8.9  MG -- 2.0  PHOS -- --   Liver Function Tests:  Lab 10/31/11 1549  AST 18  ALT 16  ALKPHOS 80  BILITOT 0.6  PROT 7.3  ALBUMIN 3.7   CBC:  Lab 11/05/11 0545 11/04/11 0550 10/31/11 1549  WBC 9.6 7.7 --  NEUTROABS -- -- 9.3*  HGB 14.0 14.0 --  HCT 41.3 41.1 --  MCV 87.7 87.8 --  PLT 157 153 --   BNP:  Lab 10/31/11 1652  PROBNP 1475.0*   Thyroid Function Tests:  Lab 11/01/11 0009  TSH 0.890  T4TOTAL --  FREET4 --  T3FREE --  THYROIDAB --   Coagulation:  Lab 11/04/11  1231 10/31/11 1549  LABPROT 14.1 13.5  INR 1.07 1.01    Studies/Results: Dg Chest 2 View  11/04/2011  *RADIOLOGY REPORT*  Clinical Data: Cough, short of breath  CHEST - 2 VIEW  Comparison: Chest radiograph 10/31/2011  Findings: Stable enlarged cardiac silhouette.  There is a left lower lobe atelectasis.  There is central venous pulmonary congestion.  No evidence of focal consolidation.  No pneumothorax.  IMPRESSION:  1.  Cardiomegaly and  left lower lobe atelectasis is similar prior. 2.  Central venous pulmonary congestion is similar to prior.  Original Report Authenticated By: Genevive Bi, M.D.   Medications: I have reviewed the patient's current medications. Scheduled Meds:   . albuterol  2.5 mg Nebulization Once  . albuterol  2.5 mg Nebulization Once  . albuterol  2.5 mg Nebulization Once  . amLODipine  10 mg Oral Daily  . aspirin EC  81 mg Oral Daily  . carvedilol  12.5 mg Oral BID  . docusate sodium  100 mg Oral BID  . enoxaparin  40 mg Subcutaneous QHS  . furosemide  40 mg Intravenous Q8H  . ipratropium  0.5 mg Nebulization Once  . losartan  50  mg Oral Daily  . loteprednol  1 drop Right Eye QID  . pantoprazole  40 mg Oral Q1200  . potassium chloride  40 mEq Oral BID  . simvastatin  20 mg Oral QHS  . spironolactone  12.5 mg Oral Daily  . Travoprost (BAK Free)  1 drop Right Eye QHS  . DISCONTD: potassium chloride  10 mEq Intravenous Q1 Hr x 3   Continuous Infusions:  PRN Meds:.acetaminophen, chlorpheniramine-HYDROcodone, guaiFENesin-dextromethorphan, ondansetron (ZOFRAN) IV  Assessment/Plan: # Syncope: No significant event since admission.   Likely related to critical aortic stenosis. S/p catheterization on 11/02/11, which revealed stenosis of LAD (95% ostial stenosis) and RCA (90% stenosis). 2D echo shows EF 30-35% with regional wall abnormality with critical stenosis of aortic valve (0.62cm2). Remains in asymptomatic sinus bradycardia. Cardiology is considering medical tx  (largely palliative) vs high risk AVR & CABG vs percutaneous coronary retention & transcatheter AVR.  Carotid doppler done today for medical optimization reveals no significant b/l carotid stenosis (prelim).  Other tests of medical optimization for possible surgery in clude PFTs, PT consult & possibly 6 min walk test.  --Continue Coreg 12.5mg  BID  --PT consult placed --Appreciate cardiology's input   # CAD- s/p heart cath. Please see #1. Continue ASA and BB, statin   # Systolic CHF: Improving clinically. net negative.  --Will continue diuresis at current dose of lasix 40mg  IV q8hr and monitor renal function with AM bmet --continue to check daily weight and daily in and outs.   # Essential hypertension: BP is adequately controlled -- Continue Lasix, Amlodipine, Coreg, and Losartan   # Hypokalemia: resolved. Likely related to diuresis. Will monitor, he may need scheduled small dose potassium ( daily) and he is on spirinolactone as well  # Cough: Likely viral x3-4 days in duration. Other ddx would be GERD. CXR suggests LLL atelectasis & unchanged pulm congestion but no focal consolidation. He remains afebrile, and no leukocytosis yesterday, therefore, antibiotics not indicated at this time. -Will continue symptoms treatment and continue to monitor -Continue protonix and monitor for improvement.    LOS: 6 days   KAPADIA, Kiegan Macaraeg 11/06/2011, 11:22 AM

## 2011-11-06 NOTE — Telephone Encounter (Signed)
New Problem  Patient Eugene [name] [id number]  Called to inform us that  Patient was admitted to the hospital.  Attorney need documentation to present to the judge of patient incapacity for trial.  Please return call to [facility]

## 2011-11-06 NOTE — Telephone Encounter (Signed)
Since it is now 7:05pm and I am just getting to look at this message I will forward to Triage to see if they can be in touch with the attorney's office tomorrow in my abscence and see what is needed.

## 2011-11-07 LAB — BASIC METABOLIC PANEL
BUN: 37 mg/dL — ABNORMAL HIGH (ref 6–23)
CO2: 24 mEq/L (ref 19–32)
Chloride: 100 mEq/L (ref 96–112)
Creatinine, Ser: 1.01 mg/dL (ref 0.50–1.35)
GFR calc Af Amer: 76 mL/min — ABNORMAL LOW (ref >90)
Glucose, Bld: 112 mg/dL — ABNORMAL HIGH (ref 70–99)
Potassium: 4.2 mEq/L (ref 3.5–5.1)

## 2011-11-07 LAB — CBC
HCT: 41.2 % (ref 39.0–52.0)
Hemoglobin: 13.6 g/dL (ref 13.0–17.0)
MCHC: 33 g/dL (ref 30.0–36.0)
MCV: 88.6 fL (ref 78.0–100.0)

## 2011-11-07 NOTE — Progress Notes (Signed)
11/07/11 1345 UR Completed. Tera Mater, RN, BSN

## 2011-11-07 NOTE — Telephone Encounter (Signed)
Informed attorney to contact [facility] since pt is an inpatient and speak to his attending physician for any required documentation.

## 2011-11-07 NOTE — Progress Notes (Signed)
Spoke with Dr Cornelius Moras at length last night. Will meet with patient after clinic tonight and tentatively plan valvuloplasty in next day or two if he decides to move forward with this. I think valvuloplasty/PCI is reasonable as his risk for cardiac decompensation/death in next few months is very high without intervention on his severe AS and CAD. I will ask nurse to arrange for his daughter or POA to be here for our discussion tonight.

## 2011-11-07 NOTE — Progress Notes (Signed)
I met with the patient, his daughter, and his minister [facility]) this evening. I had a frank discussion with them about treatment options for the patient's severe aortic stenosis and CAD. At this point those options are limited to intervention with valvuloplasty and PCI versus medical therapy/palliative care. The patient's mental status is clear this evening and he has carefully thought through his options.Based on the severity of his cardiac disease, I believe that without intervention, his risk of cardiac decompensation or death over the next several months is high and he understands this. I offered him the option of valvuloplasty/PCI with an estimated mortality risk of approximately 5-10%. He does not wish to go through any procedures at this point and he understands the limitations of medical therapy. I would be happy to see him in the future if he changes his mind.  Tonny Bollman 11/07/2011 9:27 PM

## 2011-11-07 NOTE — Progress Notes (Signed)
Subjective: Has been having discussions about plan of action regarding cardiac problems.  Feeling well this morning with exception of some dizziness with sitting up.  BP was noted to be low, and rechecked this morning, while teaching service in the room (90/52 RUE, 88/42 LUE).  Denies CP/SOB/palpitations.  Daughter at bedside.  Objective: Vital signs in last 24 hours: Filed Vitals:   11/06/11 1450 11/06/11 2111 11/07/11 0051 11/07/11 0447  BP:  126/57  149/62  Pulse: 63 45 51 69  Temp:  98.8 F (37.1 C)  97.9 F (36.6 C)  TempSrc:  Axillary    Resp:  24 18 16   Height:      Weight:    203 lb 0.7 oz (92.1 kg)  SpO2: 94% 95% 96% 95%   Weight change: 6 lb 10.3 oz (3.014 kg)  Intake/Output Summary (Last 24 hours) at 11/07/11 0804 Last data filed at 11/07/11 1610  Gross per 24 hour  Intake   1208 ml  Output    850 ml  Net    358 ml   Physical Exam: General: sitting up comfortably in chair, NAD, cooperative to exam HEENT: PERRL, EOMI, no scleral icterus, stable hematoma on right posterior scalp Cardiac: RRR, no rubs, murmurs or gallops Pulm: mild bibasilar coarse breath sounds Abd: soft, nontender, nondistended, BS normoactive Ext: warm and well perfused, no pedal edema Neuro: alert and oriented X3 (a little difficulty with year), cranial nerves II-XII grossly intact  Lab Results: Basic Metabolic Panel:  Lab 11/07/11 9604 11/06/11 0535 11/05/11 0545  NA 140 142 --  K 4.2 3.8 --  CL 100 104 --  CO2 24 26 --  GLUCOSE 112* 108* --  BUN 37* 27* --  CREATININE 1.01 0.91 --  CALCIUM 9.7 9.7 --  MG -- -- 2.0  PHOS -- -- --   CBC:  Lab 11/07/11 0525 11/05/11 0545 10/31/11 1549  WBC 8.2 9.6 --  NEUTROABS -- -- 9.3*  HGB 13.6 14.0 --  HCT 41.2 41.3 --  MCV 88.6 87.7 --  PLT 210 157 --   Cardiac Enzymes:  Lab 11/01/11 1552 11/01/11 0540 11/01/11 0009  CKTOTAL 114 97 103  CKMB 2.6 2.6 3.1  CKMBINDEX -- -- --  TROPONINI <0.30 <0.30 <0.30   BNP:  Lab 10/31/11 1652    PROBNP 1475.0*   Thyroid Function Tests:  Lab 11/01/11 0009  TSH 0.890  T4TOTAL --  FREET4 --  T3FREE --  THYROIDAB --   Coagulation:  Lab 11/04/11 1231 10/31/11 1549  LABPROT 14.1 13.5  INR 1.07 1.01   Medications: I have reviewed the patient's current medications. Scheduled Meds:   . albuterol  2.5 mg Nebulization Once  . albuterol  2.5 mg Nebulization Once  . albuterol  2.5 mg Nebulization Once  . amLODipine  10 mg Oral Daily  . aspirin EC  81 mg Oral Daily  . carvedilol  12.5 mg Oral BID  . docusate sodium  100 mg Oral BID  . furosemide  40 mg Intravenous Q8H  . ipratropium  0.5 mg Nebulization Once  . losartan  50 mg Oral Daily  . loteprednol  1 drop Right Eye QID  . pantoprazole  40 mg Oral Q1200  . simvastatin  20 mg Oral QHS  . spironolactone  12.5 mg Oral Daily  . Travoprost (BAK Free)  1 drop Right Eye QHS  . DISCONTD: enoxaparin  40 mg Subcutaneous QHS   Continuous Infusions:  PRN Meds:.acetaminophen, chlorpheniramine-HYDROcodone, guaiFENesin-dextromethorphan, ondansetron (ZOFRAN) IV  Assessment/Plan: # Syncope: No significant event since admission, but BP is low this morning, and he is feeling dizzy. Likely related to critical aortic stenosis.. S/p catheterization on 11/02/11, which revealed stenosis of LAD (95% ostial stenosis) and RCA (90% stenosis). 2D echo shows EF 30-35% with regional wall abnormality with critical stenosis of aortic valve (0.62cm2). Remains in asymptomatic sinus bradycardia. Cardiology is considering medical tx (largely palliative) vs percutaneous coronary retention & transcatheter AVR at this point, and family plans to meet with Dr. Excell Seltzer this evening for final decision. Medical optimization with carotid doppler, PFTs, and PT have been done.  --Coreg 12.5mg  BID held today in setting of low BP --Continue PT --Appreciate cardiology's input   # CAD- s/p heart cath. Please see #1. Continue ASA and BB, statin   # Systolic CHF: Improving  clinically. Net in , was being diuresed with lasix 40mg  IV TID.  Holding diuresis in setting of dizziness, rising BUN & Cr, hypotension.  Continue to monitor renal function and check daily weight and in/outs. May restart tomorrow.   # Essential hypertension: BP is adequately controlled  -- Continue Lasix, Amlodipine, Coreg, and Losartan   # Hypokalemia: resolved. Likely related to diuresis. Will monitor, he may need scheduled small dose potassium ( daily) and he is on spirinolactone as well   # Cough: Improved this morning.  Viral vs GERD. He remains afebrile, and no leukocytosis, therefore, antibiotics not indicated at this time.  -Will continue symptoms treatment and continue to monitor  -Continue protonix and monitor for improvement.    LOS: 7 days   KAPADIA, Nixxon Faria 11/07/2011, 8:04 AM

## 2011-11-07 NOTE — Progress Notes (Signed)
SUBJECTIVE:  He thinks that he is breathing is improved.  He is having no chest pain.   PHYSICAL EXAM Filed Vitals:   11/06/11 1450 11/06/11 2111 11/07/11 0051 11/07/11 0447  BP:  126/57  149/62  Pulse: 63 45 51 69  Temp:  98.8 F (37.1 C)  97.9 F (36.6 C)  TempSrc:  Axillary    Resp:  24 18 16   Height:      Weight:    92.1 kg (203 lb 0.7 oz)  SpO2: 94% 95% 96% 95%   General:  No distress HEENT:  Scalp hematoma and bruising unchanged Lungs:  Clear Heart:  RRR, systolic murmur unchanged Abdomen:  Positive bowel sounds, no rebound no guarding Extremities:  No edema Neuro:  Nonfocal  LABS: Lab Results  Component Value Date   CKTOTAL 114 11/01/2011   CKMB 2.6 11/01/2011   TROPONINI <0.30 11/01/2011   Results for orders placed during the hospital encounter of 10/31/11 (from the past 24 hour(s))  BASIC METABOLIC PANEL     Status: Abnormal   Collection Time   11/07/11  5:25 AM      Component Value Range   Sodium 140  135 - 145 (mEq/L)   Potassium 4.2  3.5 - 5.1 (mEq/L)   Chloride 100  96 - 112 (mEq/L)   CO2 24  19 - 32 (mEq/L)   Glucose, Bld 112 (*) 70 - 99 (mg/dL)   BUN 37 (*) 6 - 23 (mg/dL)   Creatinine, Ser 9.52  0.50 - 1.35 (mg/dL)   Calcium 9.7  8.4 - 84.1 (mg/dL)   GFR calc non Af Amer 66 (*) >90 (mL/min)   GFR calc Af Amer 76 (*) >90 (mL/min)  CBC     Status: Normal   Collection Time   11/07/11  5:25 AM      Component Value Range   WBC 8.2  4.0 - 10.5 (K/uL)   RBC 4.65  4.22 - 5.81 (MIL/uL)   Hemoglobin 13.6  13.0 - 17.0 (g/dL)   HCT 32.4  40.1 - 02.7 (%)   MCV 88.6  78.0 - 100.0 (fL)   MCH 29.2  26.0 - 34.0 (pg)   MCHC 33.0  30.0 - 36.0 (g/dL)   RDW 25.3  66.4 - 40.3 (%)   Platelets 210  150 - 400 (K/uL)    Intake/Output Summary (Last 24 hours) at 11/07/11 4742 Last data filed at 11/07/11 5956  Gross per 24 hour  Intake    728 ml  Output    850 ml  Net   -122 ml    ASSESSMENT AND PLAN:   1) CAD (coronary artery disease): I appreciate the  careful consideration of Drs. Cornelius Moras and [employer].  Family meeting today with the patient and Dr. Excell Seltzer to finalize plans.  I agree that medical management would be palliative.  Surgery would be prohibitively high risk for morbidity post op if he survives.  Percutaneous treatment is also high risk but and will be further discussed in detail with the patient.  The patient is awaiting this meeting to discuss medical management vs percutaneous treatment.  2) Aortic stenosis.  Critical stenosis.  Possible percutaneous valvuloplasty and eventual TAVR.    3) Ischemic cardiomyopathy:  He has some basilar crackles.  I will continue the IV Lasix again today. Continue current beta blocker, ARB and other therapy.  4) Social: Dr. Cornelius Moras met with the patients daughter yesterday.  Dr. Excell Seltzer has plans to meet with her today.  I have met twice with the patient with his HCPOA.  I met with the daughter today.  Rollene Rotunda 11/07/2011 8:32 AM

## 2011-11-07 NOTE — Progress Notes (Signed)
   CARE MANAGEMENT NOTE HEART FAILURE  11/07/2011   Patient:  Eugene [name], Eugene [name]   Account Number:  [id number]    Date Initiated:  11/07/2011  Documentation initiated by:  Tera Mater  Subjective/Objective Assessment:   78yo male admitted with syncope.  HX:  CAD, HTN, CHF.  Pt. lives alone,however has support from his friends.   Action/Plan:   Spoke with pt. at length about HF and pt. is interested in having assistance from [facility].  Pt. may be having cardiac surgery this week, he may require rehab after recovery. Pt. would be interested in [facility].   Anticipated DC Date:  11/10/2011  Anticipated DC Plan:  SKILLED NURSING FACILITY  In-house referral:  Clinical Social Worker    DC Planning Services:  CM consult    Choice offered to / List presented to:          Status of service:  In process, will continue to follow  Medicare Important Message Given:   (If response is "NO", the following Medicare IM given date fields will be blank) Date Medicare IM Given:   Date Additional Medicare IM Given:    Discharge Disposition:    Per UR Regulation:  Reviewed for med. necessity/level of care/duration of stay  Comments:   11/07/11 1215 Spoke with pt. about his discharge planning. Pt. will be meeting with Dr. Excell Seltzer tonight at 6pm to decide whether pt. will have medical managment or percutaneous treatment.  Pt. is interested in having rehab at [facility] if necessary, and eventually returning home if possible.  Will meet again with pt.in am to discuss his wishes. Tera Mater, RN, BSN   11/07/11 1345 UR Completed. Tera Mater, RN, BSN   Initial CM contact:  11/07/2011 11:30 AM  By:  Tera Mater Initial CSW contact:     By:      Is this an INP Readmission < 30 days:  N (If "YES" please see readmission information at the bottom of note)  Patient living status prior to this admission:  ALONE  Patient setting prior to this admission:  HOME  Comorbid conditions being  treated that contributed to this admission:  CHF, HTN, CAD  CHF Readmission Risk:  high  Type of patient education provided  HF Patient Education Assessment / Teach Back  HF Zone Tool / Magnet  Limit salt intake  Weigh daily     Patient education provided by  [facility]    Was referral made to [facility]:  Y  Is the patient's PCP the same as attending:  N PCP:    Readmission < 30 Days If pt has HH, did they contact the agency before going to the ED:   Name of [facility] agency:    Was the follow-up physician visit scheduled prior to discharge:    Did the patient follow-up with the physician prior to this readmission:    Was there HF Clinic visits prior to readmission:    Were there ED visits between admissions:    Readmit type:    If unscheduled and related indicate reason for readmit:

## 2011-11-07 NOTE — Progress Notes (Signed)
Nursing Note: Cosign all medications given 0700am-1900pm, initial assessment, pathway and pt education. Andrick Rust Scientist, clinical (histocompatibility and immunogenetics).

## 2011-11-07 NOTE — Progress Notes (Signed)
Internal Medicine Attending  Date: 11/07/2011  Patient name: Eugene [name] Medical record number: [id number] Date of birth: [birth year] Age: 78 y.o. Gender: male  I saw and evaluated the patient. I reviewed the resident's note by Dr. Milbert Coulter and I agree with the resident's findings and plans as documented in her note.

## 2011-11-07 NOTE — Progress Notes (Signed)
11/07/11 1018 Nursing Note :Pt's blood pressure 80/42 manually, bp taken again 77/82 dinamap, bp taken again 98/52, pt's heart rate in 50's. MD made aware, orders received to hold am Norvasc, Coreg, Cozaar, and Aldactone. Medications held per MD's order. Will continue to monitor pt. Tyjay Galindo Scientist, clinical (histocompatibility and immunogenetics).

## 2011-11-08 LAB — BASIC METABOLIC PANEL
BUN: 41 mg/dL — ABNORMAL HIGH (ref 6–23)
Creatinine, Ser: 1.12 mg/dL (ref 0.50–1.35)
GFR calc non Af Amer: 58 mL/min — ABNORMAL LOW (ref >90)
Glucose, Bld: 95 mg/dL (ref 70–99)
Potassium: 3.8 mEq/L (ref 3.5–5.1)

## 2011-11-08 MED ORDER — CARVEDILOL 6.25 MG PO TABS: 12.5000 mg | ORAL_TABLET | Freq: Two times a day (BID) | ORAL | Status: DC

## 2011-11-08 MED ORDER — PANTOPRAZOLE SODIUM 40 MG PO TBEC: 40.0000 mg | DELAYED_RELEASE_TABLET | Freq: Every day | ORAL | Status: DC

## 2011-11-08 MED ORDER — AMLODIPINE BESYLATE 5 MG PO TABS
5.0000 mg | ORAL_TABLET | Freq: Every day | ORAL | Status: DC
  Administered 2011-11-08 – 2011-11-16 (×9): 5 mg via ORAL
  Filled 2011-11-08 (×10): qty 1

## 2011-11-08 MED ORDER — FUROSEMIDE 40 MG PO TABS
40.0000 mg | ORAL_TABLET | Freq: Every day | ORAL | Status: DC
Start: 2011-11-09 — End: 2011-11-09
  Filled 2011-11-08: qty 1

## 2011-11-08 MED ORDER — FUROSEMIDE 20 MG PO TABS: 40.0000 mg | ORAL_TABLET | Freq: Every day | ORAL | Status: DC

## 2011-11-08 MED ORDER — SPIRONOLACTONE 12.5 MG HALF TABLET: 12.5000 mg | ORAL_TABLET | Freq: Every day | ORAL | Status: DC

## 2011-11-08 MED ORDER — AMLODIPINE BESYLATE 5 MG PO TABS: 5.0000 mg | ORAL_TABLET | Freq: Every day | ORAL | Status: DC

## 2011-11-08 NOTE — Progress Notes (Addendum)
Physical Therapy Treatment Patient Details Name: Eugene [name] MRN: [id number] DOB: 1924-09-01 Today's Date: 11/08/2011  PT Assessment/Plan  PT - Assessment/Plan Comments on Treatment Session: Pt adm with syncope.  Pt making good progress. PT Plan: Discharge plan remains appropriate PT Frequency: Min 3X/week Follow Up Recommendations: Skilled nursing facility Equipment Recommended: Defer to next venue PT Goals  Acute Rehab PT Goals PT Goal: Sit to Stand - Progress: Progressing toward goal PT Goal: Stand to Sit - Progress: Progressing toward goal Pt will Ambulate: >150 feet;with modified independence PT Goal: Ambulate - Progress: Updated due to goal met  PT Treatment Precautions/Restrictions  Precautions Precautions: Fall Restrictions Weight Bearing Restrictions: No Mobility (including Balance) Bed Mobility Sit to Supine: 4: Min assist Sit to Supine - Details (indicate cue type and reason): assist with legs Transfers Sit to Stand: 5: Supervision;From chair/3-in-1;With upper extremity assist;With armrests Stand to Sit: 5: Supervision;With upper extremity assist;With armrests;To chair/3-in-1 Ambulation/Gait Ambulation/Gait Assistance: 5: Supervision Ambulation/Gait Assistance Details (indicate cue type and reason): verbal cues to stay closer to walker and stand more erect. Ambulation Distance (Feet): 225 Feet Assistive device: Rolling walker Gait Pattern: Decreased step length - right;Decreased step length - left;Trunk flexed    Exercise    End of Session PT - End of Session Activity Tolerance: Patient limited by fatigue Patient left: in bed;with call bell in reach General Behavior During Session: [facility] for tasks performed Cognition: [facility] for tasks performed  [facility] 11/08/2011, 12:31 PM  [facility] PT [phone]

## 2011-11-08 NOTE — Progress Notes (Signed)
TCTS BRIEF PROGRESS NOTE   Plans for palliative medical therapy noted.  I think this is probably the most appropriate choice.  Please let me know if I can be of further assistance.  Davide Risdon H 11/08/2011 1:16 PM

## 2011-11-08 NOTE — Progress Notes (Signed)
Subjective: Feels great this morning. No c/o CP/SOB/palpitations.  Dizziness improved from yesterday. Eating well.   Objective: Vital signs in last 24 hours: Filed Vitals:   11/07/11 2055 11/07/11 2057 11/07/11 2145 11/08/11 0406  BP: 130/62 138/79  157/90  Pulse: 51 50 58 49  Temp:  98.4 F (36.9 C)  98.8 F (37.1 C)  TempSrc:      Resp:   18 18  Height:      Weight:    203 lb 11.3 oz (92.4 kg)  SpO2:  95% 96% 97%   Weight change: 10.6 oz (0.3 kg)  Intake/Output Summary (Last 24 hours) at 11/08/11 0928 Last data filed at 11/08/11 0840  Gross per 24 hour  Intake   1340 ml  Output    701 ml  Net    639 ml   Physical Exam: General: sitting up in chair having breakfast HEENT: PERRL, EOMI, no scleral icterus, stable hematoma on right posterior scalp Cardiac: RRR, no rubs, murmurs or gallops (unable to appreciate systolic murmur) Pulm: clear to auscultation bilaterally, moving normal volumes of air Abd: soft, nontender, nondistended, BS normoacitve Ext: warm and well perfused, no pedal edema Neuro: alert and oriented X3, cranial nerves II-XII grossly intact  Lab Results: Basic Metabolic Panel:  Lab 11/08/11 4010 11/07/11 0525 11/05/11 0545  NA 138 140 --  K 3.8 4.2 --  CL 100 100 --  CO2 27 24 --  GLUCOSE 95 112* --  BUN 41* 37* --  CREATININE 1.12 1.01 --  CALCIUM 9.7 9.7 --  MG -- -- 2.0  PHOS -- -- --   CBC:  Lab 11/07/11 0525 11/05/11 0545  WBC 8.2 9.6  NEUTROABS -- --  HGB 13.6 14.0  HCT 41.2 41.3  MCV 88.6 87.7  PLT 210 157   Cardiac Enzymes:  Lab 11/01/11 1552  CKTOTAL 114  CKMB 2.6  CKMBINDEX --  TROPONINI <0.30   Coagulation:  Lab 11/04/11 1231  LABPROT 14.1  INR 1.07    Medications: I have reviewed the patient's current medications. Scheduled Meds:   . albuterol  2.5 mg Nebulization Once  . albuterol  2.5 mg Nebulization Once  . amLODipine  5 mg Oral Daily  . aspirin EC  81 mg Oral Daily  . carvedilol  12.5 mg Oral BID  .  docusate sodium  100 mg Oral BID  . ipratropium  0.5 mg Nebulization Once  . losartan  50 mg Oral Daily  . loteprednol  1 drop Right Eye QID  . pantoprazole  40 mg Oral Q1200  . simvastatin  20 mg Oral QHS  . spironolactone  12.5 mg Oral Daily  . Travoprost (BAK Free)  1 drop Right Eye QHS  . DISCONTD: amLODipine  10 mg Oral Daily  . DISCONTD: furosemide  40 mg Intravenous Q8H   Continuous Infusions:  PRN Meds:.acetaminophen, chlorpheniramine-HYDROcodone, guaiFENesin-dextromethorphan, ondansetron (ZOFRAN) IV  Assessment/Plan:  # Syncope: No significant event since admission, and BP improved this morning.  Syncope PTA was likely related to critical aortic stenosis.. S/p catheterization on 11/02/11, which revealed stenosis of LAD (95% ostial stenosis) and RCA (90% stenosis). 2D echo shows EF 30-35% with regional wall abnormality with critical stenosis of aortic valve (0.62cm2). Remains in asymptomatic sinus bradycardia.  At this point, patient has chosen to pursue only medical management, and has declined invasive measures.  --Coreg 12.5mg  BID  --Continue PT  --Appreciate cardiology's input   # CAD- s/p heart cath. Please see #1. Continue ASA and  BB, statin   # Systolic CHF: Improving clinically. Net in , was being diuresed with lasix 40mg  IV TID. Held yesterday diuresis in setting of dizziness, rising BUN & Cr, hypotension. Cr rising today as well.  Discussed with Dr. Antoine Poche, we will continue Lasix 40mg  po daily outpatient, and he will be followed outpatient by Dr. Antoine Poche.     # Essential hypertension: Hypotensive yesterday, but improved today, BP meds not yet given prior to this AM vitals.  -- Continue Lasix (decrease to 40 po daily), Amlodipine (decrease to 5mg  daily), Coreg, and Losartan   # Hypokalemia: resolved. Likely related to diuresis.   # Cough: No complaints this AM. Viral vs GERD. He remains afebrile, and no leukocytosis, therefore, antibiotics still not indicated.    -Will continue symptoms treatment and continue to monitor  -Continue protonix    LOS: 8 days   KAPADIA, Eugene [name] 11/08/2011, 9:28 AM

## 2011-11-08 NOTE — Progress Notes (Signed)
Internal Medicine Attending  Date: 11/08/2011  Patient name: Eugene [name] Medical record number: [id number] Date of birth: June 03, 1924 Age: 77 y.o. Gender: male  I saw and evaluated the patient. I reviewed the resident's note by Dr. Milbert Coulter and I agree with the resident's findings and plans as documented in her note.  Dr. Drue Second will take over as attending physician tomorrow 11/09/2011

## 2011-11-08 NOTE — Progress Notes (Signed)
CSW met with patient to discuss SNF placement. Patient is agreeable to SNF placement and would like to have a bed at [facility]. Clinical Social worker completed the psychosocial assessment which can be found in the shadow chart. CSW will continue to follow to help with d/c SNF.

## 2011-11-08 NOTE — Progress Notes (Signed)
    SUBJECTIVE:  He ambulated with PT today.  He met with Dr. Excell Seltzer last night as documented.  Low BPs documented yesterday.   PHYSICAL EXAM Filed Vitals:   11/07/11 2055 11/07/11 2057 11/07/11 2145 11/08/11 0406  BP: 130/62 138/79  157/90  Pulse: 51 50 58 49  Temp:  98.4 F (36.9 C)  98.8 F (37.1 C)  TempSrc:      Resp:   18 18  Height:      Weight:    92.4 kg (203 lb 11.3 oz)  SpO2:  95% 96% 97%   General:  No distress HEENT:  Scalp hematoma and bruising unchanged Lungs:  Clear Heart:  RRR, systolic murmur unchanged Abdomen:  Positive bowel sounds, no rebound no guarding Extremities:  No edema Neuro:  Nonfocal  LABS:  Results for orders placed during the hospital encounter of 10/31/11 (from the past 24 hour(s))  BASIC METABOLIC PANEL     Status: Abnormal   Collection Time   11/08/11  5:30 AM      Component Value Range   Sodium 138  135 - 145 (mEq/L)   Potassium 3.8  3.5 - 5.1 (mEq/L)   Chloride 100  96 - 112 (mEq/L)   CO2 27  19 - 32 (mEq/L)   Glucose, Bld 95  70 - 99 (mg/dL)   BUN 41 (*) 6 - 23 (mg/dL)   Creatinine, Ser 4.09  0.50 - 1.35 (mg/dL)   Calcium 9.7  8.4 - 81.1 (mg/dL)   GFR calc non Af Amer 58 (*) >90 (mL/min)   GFR calc Af Amer 67 (*) >90 (mL/min)    Intake/Output Summary (Last 24 hours) at 11/08/11 0818 Last data filed at 11/08/11 0229  Gross per 24 hour  Intake    980 ml  Output    701 ml  Net    279 ml    ASSESSMENT AND PLAN:   1) CAD (coronary artery disease): I appreciate the careful consideration of Drs. Cornelius Moras and [employer].  Dr. Excell Seltzer met with the patients daughter and HCPOA and the patient yesterday.  The plan was for medical management.    2) Aortic stenosis.  Critical stenosis.  As above.  3) Ischemic cardiomyopathy:  He has some basilar crackles.  Lasix held yesterday because of hypotension.  I would restart a low dose 40 mg.  I reduced the Norvasc dose today.  For now continue the other meds although we will need to watch this  closely.   4) Social: Disposition per primary team and patient.  He will need at least short term rehab with his weakness.  We will need to follow him closely in our clinic.  Don't discharge without letting us know so that we can arrange that follow up.  [name] [facility] 11/08/2011 8:18 AM

## 2011-11-08 NOTE — Progress Notes (Signed)
Clinical Social Work-CSW A. Stuckey LCSW completed assessment with pt-please see shadow chart. This CSW contacted pt POA/pastor and confirmed d/c plan to SNF- CSW initiated FL2, bed search and contacted [facility] to alert them they are pt first choice. CSW will follow and facilitate d/c to [facility] in AM-Syra Sirmons-MSW, [phone]

## 2011-11-08 NOTE — Discharge Summary (Deleted)
Internal Medicine Teaching [facility] Discharge Note  Name: Eugene [name] MRN: [id number] DOB: Dec 16, 1923 78 y.o.  Date of Admission: 10/31/2011  3:14 PM Date of Discharge: 11/16/2011 Attending Physician: Judyann Munson, MD  Discharge Diagnosis: Principal Problem:  *Syncope and collapse Active Problems:  HYPERLIPIDEMIA  Essential hypertension, benign  Systolic Congestive heart failure - TTE 11/02/11 - EF 30-35%  SVT (supraventricular tachycardia)  CAD (coronary artery disease) - s/p cath 11/02/11 - RCA 90% stenosis, LAD (ostial) 95% stenosis, s/p BMS x2 to LAD 11/14/11  Critical Aortic stenosis - s/p aortic balloon valvuloplasty  Discharge Medications: Medication List  As of 11/16/2011 11:17 AM   STOP taking these medications         furosemide 40 MG tablet         TAKE these medications         amLODipine 5 MG tablet   Commonly known as: NORVASC   Take 1 tablet (5 mg total) by mouth daily.      aspirin 81 MG tablet   Take 81 mg by mouth daily.      carvedilol 6.25 MG tablet   Commonly known as: COREG   Take 2 tablets (12.5 mg total) by mouth 2 (two) times daily.      docusate sodium 100 MG capsule   Commonly known as: COLACE   Take 100 mg by mouth 2 (two) times daily.      furosemide 20 MG tablet   Commonly known as: LASIX   Take 2 tablets (40 mg total) by mouth daily.      losartan 50 MG tablet   Commonly known as: COZAAR   Take 50 mg by mouth daily.      loteprednol 0.5 % ophthalmic suspension   Commonly known as: LOTEMAX   Place 1 drop into the right eye 4 (four) times daily.      pantoprazole 40 MG tablet   Commonly known as: PROTONIX   Take 1 tablet (40 mg total) by mouth daily at 12 noon.      simvastatin 40 MG tablet   Commonly known as: ZOCOR   Take 40 mg by mouth at bedtime.      spironolactone 12.5 mg Tabs   Commonly known as: ALDACTONE   Take 0.5 tablets (12.5 mg total) by mouth daily.      travoprost (benzalkonium) 0.004 % ophthalmic  solution   Commonly known as: TRAVATAN   Place 1 drop into the right eye at bedtime.           Disposition and follow-up:   [name] was discharged from [facility] to SNF in Fair condition.  He will follow-up with Dr. Graciela Husbands on 12/11/11, and with Dr. Baltazar Apo on 12/07/11.  At hospital follow up, he will need to have his renal function monitored, and he will need to be evaluated for further episodes of syncope and further symptoms of his critical aortic stenosis.   Follow-up Appointments: Follow-up Information    Follow up with Sherryl Manges, MD on 12/11/2011. (11 am)    Contact information:   [address], [address] [place] [address] [phone]       Follow up with Melida Quitter, MD on 12/07/2011. (10:15am)    Contact information:   [address] [place] [address] [phone]         Discharge Orders    Future Appointments: Provider: Department: Dept Phone: Center:   12/07/2011 10:15  AM Melida Quitter, MD [facility] [phone] [facility]     Consultations: Cardiology, CT surgery   Procedures Performed:  PTCA and stenting of proximal LAD 11/14/11 - successful complex PCI of severe stenosis in the proximal LAD using a bare-metal stent platform.  Balloon aortic valvuloplasty 11/12/11 - successful balloon aortic valvuloplasty  Dg Chest 2 View 11/04/2011  Clinical Data: Cough, short of breath  Comparison: Chest radiograph 10/31/2011  Findings: Stable enlarged cardiac silhouette.  There is a left lower lobe atelectasis.  There is central venous pulmonary congestion.  No evidence of focal consolidation.  No pneumothorax.  IMPRESSION:  1.  Cardiomegaly and  left lower lobe atelectasis is similar prior. 2.  Central venous pulmonary congestion is similar to prior.  Original Report Authenticated By: Eugene [name], M.D.   Dg Chest 2 View 10/31/2011 Clinical Data: Status post fall with a blow to the back  of the head.  Comparison: Plain films of the chest 04/13/2011.  Findings: There is cardiomegaly with some pulmonary vascular congestion.  No focal airspace disease or pleural effusion.  No pneumothorax.  Remote right humerus fracture noted.  IMPRESSION: Cardiomegaly and vascular congestion without acute disease.  Original Report Authenticated By: Eugene [name]. Eugene [name], M.D.   Ct Head Wo Contrast 10/31/2011  Clinical Data: Fall going up steps.  Amnestic of the incident. Abrasion between eyebrows.  The large hematoma in the right parietal scalp.  CT HEAD WITHOUT CONTRAST  Technique:  Contiguous axial images were obtained from the base of the skull through the vertex without contrast.  Comparison: CT head without contrast 09/12/2009.  Findings: A large right parietal scalp hematoma is present. Although there is some motion in this area, there is no underlying fracture. Minimal soft tissue swelling is noted of the glabella. There is no underlying fracture.  No other significant extracranial injury is evident.  The study is mildly degraded by motion.  Atrophy and moderate white matter change is stable.  No acute cortical infarct, hemorrhage, or mass lesion is present.  Dolichoectasia of the basilar artery is stable.  The ventricles are proportionate to the degree of atrophy. No significant extra-axial fluid collection is present.  The paranasal sinuses and mastoid air cells are clear.  A metallic scleral band of the lateral aspect of the right globe is stable.  IMPRESSION:  1.  The large right parietal scalp hematoma without underlying fracture. 2.  Minimal soft tissue swelling above the glabella without underlying fracture. 3.  Stable atrophy and white matter disease. 4.  No acute intracranial abnormality. 5.  Atherosclerosis.  Original Report Authenticated By: Eugene [name]. Eugene [name], M.D.   2D Echo: 11/02/11 - Left ventricle: The cavity size was severely dilated. Wall thickness was increased in a pattern of mild  LVH. Systolic function was moderately to severely reduced. The estimated ejection fraction was in the range of 30% to 35%. Doppler parameters are consistent with elevated ventricular end-diastolic filling pressure. - Regional wall motion abnormality: Akinesis and scarring of the basal-mid inferolateral myocardium; akinesis of the basal-mid anterolateral and apical lateral myocardium; hypokinesis of the basal-mid inferoseptal, entire inferior, and apical septal myocardium; mild hypokinesis of the entire anterior, basal-mid anteroseptal, and apical myocardium. - Aortic valve: There was critical stenosis. Trivial regurgitation. Peak velocity: 410cm/s (S). Valve area: 0.6cm^2(VTI). Valve area: 0.62cm^2 (Vmax). - Mitral valve: Calcified annulus. Mild regurgitation. - Left atrium: The atrium was severely dilated.  2D Echo: 11/12/11 - Left ventricle: The cavity size was normal. Wall thickness was increased in a  pattern of severe LVH. Systolic function was moderately to severely reduced. The estimated ejection fraction was in the range of 30% to 35%. Diffuse hypokinesis. There is akinesis of the posterior myocardium. There is severe hypokinesis of the inferolateral myocardium. - Aortic valve: Valve mobility was restricted. There was severe stenosis. Mild regurgitation. Valve area: 0.7cm^2(VTI). Valve area: 0.63cm^2 (Vmax). - Mitral valve: Calcified annulus. Mild regurgitation. - Left atrium: The atrium was mildly dilated.  Cardiac Cath: 11/02/11 Coronary dominance: Right  Left mainstem: Luminal irregularities  Left anterior descending (LAD): Ostial 95% stenosis. There is an aneurysmal dilatation immediately following this. The LAD otherwise has diffuse nonobstructive plaque. The LAD does feed collaterals to an occluded large posterior lateral. Mid diagonal is small to moderate sized and free of high-grade disease.  Ramus intermedius: Very large vessel with mild luminal irregularities.  Left  circumflex (LCx): Circumflex in the AV groove happens proximal luminal irregularities. There is a branching mid obtuse marginal. There appears to be a proximal 70% stenosis prior to bifurcation. The remainder of the vessel is free of high-grade disease although there is a superior branch 30-40% stenosis.  Right coronary artery (RCA): Severe diffuse disease. There is proximal 75 followed by 90% stenosis. There is diffuse mid heavy calcification with tandem focal 50% lesions. There is a long 90% stenosis before a small PDA. The vessel is then occluded before the large posterior lateral with collaterals as described.  Left ventriculography: I was unable to cross the aortic valve.  Final Conclusions: Severe coronary disease as described below. Echo results from this admission are pending. He is known to have a mild to moderately reduced ejection fraction by his previous echo with at least moderate aortic stenosis.  Carotid Doppler: 11/06/11 - Technically difficult study. - No significant extracranial carotid artery stenosis demonstrated. Vertebrals are patent with antegrade flow.  PFT: 11/06/11, results to be scanned in  Admission HPI:  NATHANEL TALLMAN is an 78 y.o. male with multiple medical problems including known coronary disease, congestive heart failure on diastolic basis, mild aortic stenosis, supraventricular tachycardia, obstructive sleep apnea on CPAP brought into the emergency room as he had loss of consciousness. He did not recall the event. There has been no reported seizure activity, loss of bowel or bladder function, or post ictal confusion. He denied any chest pain or shortness of breath. He has no fever, chills, abdominal cramps or pain, black stool or bloody stool. Evaluation in the emergency room included an EKG which shows SVT, negative cardiac markers, normal renal functions, and chest x-ray showed vascular congestion with elevated BNP of 1400. In the emergency room he has persistent  tachycardia with a heart rate of 140, blood pressure of 110, and is rather asymptomatic. Hospitalist was asked to admit patient for further evaluation and treatment.  Admission Physical Exam:  Filed Vitals:    10/31/11 1558  10/31/11 1719  10/31/11 1859  10/31/11 1931   BP:   108/85  113/83    Pulse:   146  142    Temp:       TempSrc:       Resp:    25    SpO2:  90%  86%  90%  89%    Blood pressure 113/83, pulse 142, temperature 98.7 F (37.1 C), temperature source Oral, resp. rate 25, SpO2 89.00%.  GEN: Pleasant person lying in the stretcher in no acute distress; cooperative with exam  PSYCH: alert and oriented x4; does not appear anxious does not appear depressed;  affect is normal  HEENT: Mucous membranes pink and anicteric; PERRLA; EOM intact; no cervical lymphadenopathy nor thyromegaly or carotid bruit; no JVD; there is a small hematoma on his forehead  Breasts:: Not examined  CHEST WALL: No tenderness  CHEST: Normal respiration, clear to auscultation bilaterally  HEART: Regular rate tachycardic; no murmurs rubs or gallops  BACK: No kyphosis or scoliosis; no CVA tenderness  ABDOMEN: Obese, soft non-tender; no masses, no organomegaly, normal abdominal bowel sounds; no pannus; no intertriginous candida.  Rectal Exam: Not done  EXTREMITIES: No bone or joint deformity; age-appropriate arthropathy of the hands and knees; there is 2+ edema bilaterally; no ulcerations.  Genitalia: not examined  PULSES: 2+ and symmetric  SKIN: Normal hydration no rash or ulceration  CNS: Cranial nerves 2-12 grossly intact no focal neurologic deficit   Admission Labs & Imaging  Results for orders placed during the hospital encounter of 10/31/11 (from the past 48 hour(s))   CARDIAC PANEL(CRET KIN+CKTOT+MB+TROPI) Status: Abnormal    Collection Time    10/31/11 3:32 PM   Component  Value  Range  Comment    Total CK  102  7 - 232 (U/L)     CK, MB  4.1 (*)  0.3 - 4.0 (ng/mL)     Troponin I  <0.30  <0.30  (ng/mL)     Relative Index  4.0 (*)  0.0 - 2.5    CBC Status: Abnormal    Collection Time    10/31/11 3:49 PM   Component  Value  Range  Comment    WBC  11.8 (*)  4.0 - 10.5 (K/uL)     RBC  5.47  4.22 - 5.81 (MIL/uL)     Hemoglobin  16.7  13.0 - 17.0 (g/dL)     HCT  04.5  40.9 - 52.0 (%)     MCV  87.9  78.0 - 100.0 (fL)     MCH  30.5  26.0 - 34.0 (pg)     MCHC  34.7  30.0 - 36.0 (g/dL)     RDW  81.1  91.4 - 15.5 (%)     Platelets  177  150 - 400 (K/uL)    DIFFERENTIAL Status: Abnormal    Collection Time    10/31/11 3:49 PM   Component  Value  Range  Comment    Neutrophils Relative  78 (*)  43 - 77 (%)     Neutro Abs  9.3 (*)  1.7 - 7.7 (K/uL)     Lymphocytes Relative  10 (*)  12 - 46 (%)     Lymphs Abs  1.2  0.7 - 4.0 (K/uL)     Monocytes Relative  11  3 - 12 (%)     Monocytes Absolute  1.3 (*)  0.1 - 1.0 (K/uL)     Eosinophils Relative  1  0 - 5 (%)     Eosinophils Absolute  0.1  0.0 - 0.7 (K/uL)     Basophils Relative  0  0 - 1 (%)     Basophils Absolute  0.0  0.0 - 0.1 (K/uL)    COMPREHENSIVE METABOLIC PANEL Status: Abnormal    Collection Time    10/31/11 3:49 PM   Component  Value  Range  Comment    Sodium  137  135 - 145 (mEq/L)     Potassium  3.6  3.5 - 5.1 (mEq/L)     Chloride  100  96 - 112 (mEq/L)     CO2  24  19 - 32 (mEq/L)     Glucose, Bld  117 (*)  70 - 99 (mg/dL)     BUN  14  6 - 23 (mg/dL)     Creatinine, Ser  5.62  0.50 - 1.35 (mg/dL)     Calcium  9.5  8.4 - 10.5 (mg/dL)     Total Protein  7.3  6.0 - 8.3 (g/dL)     Albumin  3.7  3.5 - 5.2 (g/dL)     AST  18  0 - 37 (U/L)     ALT  16  0 - 53 (U/L)     Alkaline Phosphatase  80  39 - 117 (U/L)     Total Bilirubin  0.6  0.3 - 1.2 (mg/dL)     GFR calc non Af Amer  84 (*)  >90 (mL/min)     GFR calc Af Amer  >90  >90 (mL/min)    CK Status: Normal    Collection Time    10/31/11 3:49 PM   Component  Value  Range  Comment    Total CK  101  7 - 232 (U/L)    PROTIME-INR Status: Normal    Collection Time     10/31/11 3:49 PM   Component  Value  Range  Comment    Prothrombin Time  13.5  11.6 - 15.2 (seconds)     INR  1.01  0.00 - 1.49    APTT Status: Normal    Collection Time    10/31/11 3:49 PM   Component  Value  Range  Comment    aPTT  30  24 - 37 (seconds)    PRO B NATRIURETIC PEPTIDE Status: Abnormal    Collection Time    10/31/11 4:52 PM   Component  Value  Range  Comment    Pro B Natriuretic peptide (BNP)  1475.0 (*)  0 - 450 (pg/mL)    URINALYSIS, ROUTINE W REFLEX MICROSCOPIC Status: Abnormal    Collection Time    10/31/11 6:33 PM   Component  Value  Range  Comment    Color, Urine  YELLOW  YELLOW     APPearance  CLEAR  CLEAR     Specific Gravity, Urine  1.016  1.005 - 1.030     pH  7.0  5.0 - 8.0     Glucose, UA  NEGATIVE  NEGATIVE (mg/dL)     Hgb urine dipstick  NEGATIVE  NEGATIVE     Bilirubin Urine  NEGATIVE  NEGATIVE     Ketones, ur  NEGATIVE  NEGATIVE (mg/dL)     Protein, ur  >130 (*)  NEGATIVE (mg/dL)     Urobilinogen, UA  1.0  0.0 - 1.0 (mg/dL)     Nitrite  NEGATIVE  NEGATIVE     Leukocytes, UA  NEGATIVE  NEGATIVE    URINE MICROSCOPIC-ADD ON Status: Abnormal    Collection Time    10/31/11 6:33 PM   Component  Value  Range  Comment    Squamous Epithelial / LPF  FEW (*)  RARE     WBC, UA  0-2  <3 (WBC/hpf)     RBC / HPF  0-2  <3 (RBC/hpf)     Bacteria, UA  RARE  RARE     Hospital Course by problem list: -Patient was initially admitted by [facility] and transferred to IMTS day after admission.  # Syncope: No significant event since admission.  At admission, patient was found to be  in SVT, and subsequently remained in sinus bradycardia with carvedilol (initially treated with diltiazem gtt). Syncope PTA was likely related to critical aortic stenosis found on echo vs SVT/arrythmia. Pt is S/p catheterization on 11/02/11, which revealed stenosis of LAD (95% ostial stenosis) and RCA (90% stenosis). 2D echo shows EF 30-35% with regional wall abnormality with critical stenosis of aortic  valve (0.62cm2). During hospitalization, patient had lengthy, careful discussions with primary team, cardiology and CT surgery regarding options. Options considered include medical tx (largely palliative) vs high risk AVR & CABG (by Dr. Cornelius Moras) vs percutaneous coronary retention & transcatheter AVR (by Dr. Excell Seltzer).  He did undergo medical optimization by PFTs, carotid doppler and PT evaluation.  The patient initially decided to pursue medical/palliative management, but later decided to pursue interventional options.  The patient underwent balloon aortic valvuloplasty on 11/12/11 with small improvement in aortic valve area, and PCI with 2 bare metal stents of his LAD on 11/14/11.  The patient continued to experience episodes of SVT and sinus bradycardia with 1st degree block throughout his admission.  The patient was started on amiodarone, and his coreg was reduced to 6.25 BID.  The patient will be discharged to a skilled nursing facility, and will follow-up with Cardiology.  # CAD- s/p heart cath and BMS of LAD. Please see above. Plan to continue ASA and BB, statin   # Systolic CHF: The patient presented with shortness of breath and concern for volume overload, and was initially aggressively diuresed with lasix 40mg  IV TID, then transitioned back to his home dose of lasix 40 daily.  The patient was discharged on his home dose of lasix, and will follow-up with Cardiology.  # Essential hypertension: Generally well managed during hospitalization, but did have 1 day of softer BPs (11/07/11).  BP improved by day prior to discharge.  Continue Lasix (decrease to 40 po daily), Amlodipine (decrease to 5mg  daily), Coreg, and Losartan.   # Hypokalemia: resolved. Likely related to diuresis.   # Cough: Significantly improved, likely viral vs GERD. He remains afebrile, and no leukocytosis, therefore, antibiotics not indicated.  Being treated with Protonix.  #Social: Patient is scheduled to go to Trial on Nov 19, 2011.  He is  the plaintiff and his daughter is the defendant.  I spoke with him, his HPOA (Rev Arrie Aran, [phone], [phone]) and his Gerrit Friends [facility] [phone] (w), [phone] (c)) about this issue extensively.  Dr. Milbert Coulter has written and sent a letter to the patient's attorney discussing his current hospitalization and asking to move the date of the upcoming trial, which appears to be a routine measure in such circumstances.  Mr. Dotzler has signed a release form (paper copy in shadow chart), and the letter was faxed to his attorney (fax # [phone]).  He does not want [place] to provide a copy to his daugther, Gery Pray.   Discharge Vitals:  BP 129/42  Pulse 55  Temp(Src) 97.8 F (36.6 C) (Oral)  Resp 17  Ht 5\' 7"  (1.702 m)  Wt 200 lb 2.8 oz (90.8 kg)  BMI 31.35 kg/m2  SpO2 94%  Discharge Labs:  Results for orders placed during the hospital encounter of 10/31/11 (from the past 24 hour(s))  CBC     Status: Abnormal   Collection Time   11/16/11  4:43 AM      Component Value Range   WBC 10.8 (*) 4.0 - 10.5 (K/uL)   RBC 4.19 (*) 4.22 - 5.81 (MIL/uL)   Hemoglobin 12.4 (*) 13.0 -  17.0 (g/dL)   HCT 16.1 (*) 09.6 - 52.0 (%)   MCV 89.5  78.0 - 100.0 (fL)   MCH 29.6  26.0 - 34.0 (pg)   MCHC 33.1  30.0 - 36.0 (g/dL)   RDW 04.5  40.9 - 81.1 (%)   Platelets 293  150 - 400 (K/uL)  BASIC METABOLIC PANEL     Status: Abnormal   Collection Time   11/16/11  4:43 AM      Component Value Range   Sodium 136  135 - 145 (mEq/L)   Potassium 4.1  3.5 - 5.1 (mEq/L)   Chloride 102  96 - 112 (mEq/L)   CO2 24  19 - 32 (mEq/L)   Glucose, Bld 111 (*) 70 - 99 (mg/dL)   BUN 26 (*) 6 - 23 (mg/dL)   Creatinine, Ser 9.14  0.50 - 1.35 (mg/dL)   Calcium 9.4  8.4 - 78.2 (mg/dL)   GFR calc non Af Amer 66 (*) >90 (mL/min)   GFR calc Af Amer 77 (*) >90 (mL/min)    Signed: [name] 11/16/2011, 11:17 AM

## 2011-11-09 LAB — BASIC METABOLIC PANEL
BUN: 33 mg/dL — ABNORMAL HIGH (ref 6–23)
CO2: 24 mEq/L (ref 19–32)
Calcium: 10 mg/dL (ref 8.4–10.5)
GFR calc non Af Amer: 78 mL/min — ABNORMAL LOW (ref >90)
Glucose, Bld: 102 mg/dL — ABNORMAL HIGH (ref 70–99)
Sodium: 139 mEq/L (ref 135–145)

## 2011-11-09 MED ORDER — FUROSEMIDE 40 MG PO TABS
40.0000 mg | ORAL_TABLET | Freq: Every day | ORAL | Status: DC
  Administered 2011-11-09 – 2011-11-16 (×7): 40 mg via ORAL
  Filled 2011-11-09 (×8): qty 1

## 2011-11-09 NOTE — Progress Notes (Signed)
    SUBJECTIVE:  He ambulated with PT today.  He denies any chest pain.  No new SOB.  No PND or orthopnea.  Eating well.  Reading the paper.  PHYSICAL EXAM Filed Vitals:   11/08/11 1458 11/08/11 2051 11/09/11 0542 11/09/11 0949  BP: 112/56 146/74 145/66 138/56  Pulse: 81 54 59 76  Temp: 97.9 F (36.6 C) 98.4 F (36.9 C) 98.3 F (36.8 C) 98.4 F (36.9 C)  TempSrc: Oral Oral Oral Oral  Resp: 16 18 18 18   Height:      Weight:   91.264 kg (201 lb 3.2 oz)   SpO2: 94% 95% 96% 97%   General:  No distress HEENT:  Scalp hematoma and bruising unchanged Lungs:  Clear Heart:  RRR, systolic murmur unchanged Abdomen:  Positive bowel sounds, no rebound no guarding Extremities:  No edema  LABS:  Results for orders placed during the hospital encounter of 10/31/11 (from the past 24 hour(s))  BASIC METABOLIC PANEL     Status: Abnormal   Collection Time   11/09/11  6:00 AM      Component Value Range   Sodium 139  135 - 145 (mEq/L)   Potassium 4.0  3.5 - 5.1 (mEq/L)   Chloride 103  96 - 112 (mEq/L)   CO2 24  19 - 32 (mEq/L)   Glucose, Bld 102 (*) 70 - 99 (mg/dL)   BUN 33 (*) 6 - 23 (mg/dL)   Creatinine, Ser 1.61  0.50 - 1.35 (mg/dL)   Calcium 09.6  8.4 - 10.5 (mg/dL)   GFR calc non Af Amer 78 (*) >90 (mL/min)   GFR calc Af Amer >90  >90 (mL/min)    Intake/Output Summary (Last 24 hours) at 11/09/11 1156 Last data filed at 11/09/11 0950  Gross per 24 hour  Intake    720 ml  Output   1075 ml  Net   -355 ml    ASSESSMENT AND PLAN:   1) CAD (coronary artery disease): The patient initially decided against PCI and valvuloplasty.  However, he has reconsidered and he would like to proceed with this.  He is very aware and clearly able to make this decision for himself.  I spoke with Dr. Excell Seltzer and he does agree to perform these procedures.  However, I am not clear of the timing.  Discharge is cancelled and he will remain in the hospital.  2) Aortic stenosis.  Critical stenosis.  As  above.  3) Ischemic cardiomyopathy:  He seems to be euvolemic on low dose lasix po.   4) Social: I met with the patient and his daughter again today.  [name] [facility] 11/09/2011 11:56 AM

## 2011-11-09 NOTE — Progress Notes (Signed)
PT Cancellation Note  Treatment cancelled today due to patient's refusal to participate.  Patient reports he has been ambulating with mobility team and nursing this am.  Patient politely requests to rest now.  Will return at later time.  Vena Austria [phone] 11/09/2011, 12:42 PM

## 2011-11-09 NOTE — Progress Notes (Signed)
Clinical Social Work-CSW received notification of pt change in plan of care-CSW will follow and facilitate d/c to [facility] when medically stable and pending bed availability-Imane Burrough-MSW, [phone]

## 2011-11-09 NOTE — Progress Notes (Addendum)
Subjective: Continued slow, intermittent bleed from head wound overnight.  Overnight, patient had a discussion with his siblings, and decided that he did indeed want to pursue PCI.  Objective: Vital signs in last 24 hours: Filed Vitals:   11/08/11 2051 11/09/11 0542 11/09/11 0949 11/09/11 1327  BP: 146/74 145/66 138/56 104/53  Pulse: 54 59 76 67  Temp: 98.4 F (36.9 C) 98.3 F (36.8 C) 98.4 F (36.9 C) 97.7 F (36.5 C)  TempSrc: Oral Oral Oral Oral  Resp: 18 18 18 18   Height:      Weight:  201 lb 3.2 oz (91.264 kg)    SpO2: 95% 96% 97% 95%   Weight change: -2 lb 8.1 oz (-1.136 kg)  Intake/Output Summary (Last 24 hours) at 11/09/11 1445 Last data filed at 11/09/11 1300  Gross per 24 hour  Intake    600 ml  Output   1075 ml  Net   -475 ml   Physical Exam: General: alert, cooperative, and in no apparent distress HEENT: large posterior head hematoma, pupils equal round and reactive to light, vision grossly intact, oropharynx clear and non-erythematous  Neck: supple, no lymphadenopathy Lungs: clear to ascultation bilaterally, normal work of respiration, no wheezes, rales, ronchi Heart: regular rate and rhythm, no murmurs, gallops, or rubs Abdomen: soft, non-tender, non-distended, normal bowel sounds Extremities: no cyanosis, clubbing, or edema Neurologic: alert & oriented X3, cranial nerves II-XII intact, strength grossly intact, sensation intact to light touch  Lab Results: Basic Metabolic Panel:  Lab 11/09/11 1610 11/08/11 0530 11/05/11 0545  NA 139 138 --  K 4.0 3.8 --  CL 103 100 --  CO2 24 27 --  GLUCOSE 102* 95 --  BUN 33* 41* --  CREATININE 0.82 1.12 --  CALCIUM 10.0 9.7 --  MG -- -- 2.0  PHOS -- -- --   CBC:  Lab 11/07/11 0525 11/05/11 0545  WBC 8.2 9.6  NEUTROABS -- --  HGB 13.6 14.0  HCT 41.2 41.3  MCV 88.6 87.7  PLT 210 157   Coagulation:  Lab 11/04/11 1231  LABPROT 14.1  INR 1.07    Medications: I have reviewed the patient's current  medications. Scheduled Meds:   . albuterol  2.5 mg Nebulization Once  . albuterol  2.5 mg Nebulization Once  . amLODipine  5 mg Oral Daily  . aspirin EC  81 mg Oral Daily  . carvedilol  12.5 mg Oral BID  . docusate sodium  100 mg Oral BID  . furosemide  40 mg Oral Daily  . ipratropium  0.5 mg Nebulization Once  . losartan  50 mg Oral Daily  . loteprednol  1 drop Right Eye QID  . pantoprazole  40 mg Oral Q1200  . simvastatin  20 mg Oral QHS  . spironolactone  12.5 mg Oral Daily  . Travoprost (BAK Free)  1 drop Right Eye QHS  . DISCONTD: furosemide  40 mg Oral Daily   Continuous Infusions:  PRN Meds:.acetaminophen, chlorpheniramine-HYDROcodone, guaiFENesin-dextromethorphan, ondansetron (ZOFRAN) IV   Assessment/Plan: 78 yo man, history of CAD, CHF, AS, SVT, OSA, presenting with syncope, found to have critical aortic stenosis, now considering PCI.  # Syncope: No significant event since admission. Syncope PTA was likely related to critical aortic stenosis.. S/p catheterization on 11/02/11, which revealed stenosis of LAD (95% ostial stenosis) and RCA (90% stenosis). 2D echo shows EF 30-35% with regional wall abnormality with critical stenosis of aortic valve (0.62cm2). Remains in asymptomatic sinus bradycardia. Patient initially decided to pursue palliative  options, but now has decided to pursue PCI --Coreg 12.5mg  BID  --Continue PT  --Appreciate cardiology's input, plan for PCI sometime next week  # CAD- s/p heart cath. Please see #1. Continue ASA and BB, statin   # Systolic CHF: Improving clinically. Net in , was being diuresed with lasix 40mg  IV TID. Held yesterday diuresis in setting of dizziness, rising BUN & Cr, hypotension. Cr initially up, now downtrending. Discussed with Dr. Antoine Poche, we will continue Lasix 40mg  po daily, and he will be followed outpatient by Dr. Antoine Poche.   # Essential hypertension: Hypotensive yesterday, but improved today, BP meds not yet given prior to  this AM vitals.  -- Continue Lasix 40 PO daily, Amlodipine 5mg  daily, Coreg, and Losartan   # Hypokalemia: resolved. Likely related to diuresis.   # Cough: No complaints this AM. Viral vs GERD. He remains afebrile, and no leukocytosis, therefore, antibiotics still not indicated.  -Will continue symptoms treatment and continue to monitor  -Continue protonix   # Dispo - plan for PCI sometime mid-next week   LOS: 9 days   [name] 11/09/2011, 2:45 PM  Internal Medicine Teaching Service Attending Note Date: 11/09/2011  Patient name: Eugene [name]  Medical record number: [id number]  Date of birth: 11-07-1923    This patient has been seen and discussed with Dr. Manson Passey. Please see his note for complete details. I concur with their findings with the following additions/corrections: patient will continue to participate in PT as tolerated and will await scheduling of his valvuloplasty for critical AS.  Duke Salvia Drue Second MD MPH [facility] [phone] 11/09/2011, 9:34 PM

## 2011-11-10 LAB — BASIC METABOLIC PANEL
CO2: 26 mEq/L (ref 19–32)
Calcium: 9.8 mg/dL (ref 8.4–10.5)
Creatinine, Ser: 0.93 mg/dL (ref 0.50–1.35)
GFR calc non Af Amer: 74 mL/min — ABNORMAL LOW (ref >90)
Glucose, Bld: 100 mg/dL — ABNORMAL HIGH (ref 70–99)

## 2011-11-10 LAB — CBC
Hemoglobin: 13.6 g/dL (ref 13.0–17.0)
MCH: 29.4 pg (ref 26.0–34.0)
MCHC: 33.2 g/dL (ref 30.0–36.0)
MCV: 88.6 fL (ref 78.0–100.0)
Platelets: 270 10*3/uL (ref 150–400)
RBC: 4.63 MIL/uL (ref 4.22–5.81)

## 2011-11-10 NOTE — Progress Notes (Signed)
Physical assessment completed by [facility] student nurse, Burlene Arnt. Supervised by this RN, Theatre manager.

## 2011-11-10 NOTE — Progress Notes (Signed)
Subjective:  ONly c/o is injury to back of scalp when he fell.  Denies SOB or chest pain  Objective:  Vital Signs in the last 24 hours: BP 131/73  Pulse 86  Temp(Src) 97.7 F (36.5 C) (Oral)  Resp 16  Ht 5\' 7"  (1.702 m)  Wt 91.082 kg (200 lb 12.8 oz)  BMI 31.45 kg/m2  SpO2 98%  Physical Exam: Talkative, elderly WM in NAD Lungs:  Clear to A&P Cardiac:  Regular rhythm, normal S1 and S2, no S3 2/6 har Skin:  Hematoma/abrasion on posterior scalp Extremities:  No edema present  Intake/Output from previous day: 02/01 0701 - 02/02 0700 In: 960 [P.O.:960] Out: 225 [Urine:225]  Lab Results: Basic Metabolic Panel:  Basename 11/10/11 0615 11/09/11 0600  NA 136 139  K 3.9 4.0  CL 101 103  CO2 26 24  GLUCOSE 100* 102*  BUN 30* 33*  CREATININE 0.93 0.82    CBC:  Basename 11/10/11 0615  WBC 7.4  NEUTROABS --  HGB 13.6  HCT 41.0  MCV 88.6  PLT 270    PROTIME: Lab Results  Component Value Date   INR 1.07 11/04/2011   INR 1.01 10/31/2011   INR 1.06 04/13/2011    Telemetry: Reviewed sinus with occasion PVC"s  Assessment/Plan:  1. Critical AS 2. Severe CAD 3. Syncope due to 1 and 2  Plan:  Continue current therapy. Interventional therapy next week by Dr. Excell Seltzer.     Darden Palmer.  MD [facility] 11/10/2011, 11:07 AM

## 2011-11-10 NOTE — Progress Notes (Signed)
Subjective: No acute events overnight.  Patient's head wound has been cleaned, and is no longer oozing blood.  Patient continues to walk with PT.  No chest pain, SOB, dizziness, lightheadedness.  Objective: Vital signs in last 24 hours: Filed Vitals:   11/09/11 1822 11/10/11 0545 11/10/11 0955 11/10/11 1004  BP: 123/61 157/59 131/73 131/73  Pulse: 54 62 86   Temp: 98.3 F (36.8 C) 97.7 F (36.5 C) 97.7 F (36.5 C)   TempSrc: Oral Oral Oral   Resp: 18 18 16    Height:      Weight:  200 lb 12.8 oz (91.082 kg)    SpO2: 94% 99% 98%    Weight change: -6.4 oz (-0.181 kg)  Intake/Output Summary (Last 24 hours) at 11/10/11 1306 Last data filed at 11/10/11 1033  Gross per 24 hour  Intake    600 ml  Output    360 ml  Net    240 ml   Physical Exam: General: alert, cooperative, and in no apparent distress HEENT: large posterior head hematoma, pupils equal round and reactive to light, vision grossly intact, oropharynx clear and non-erythematous  Neck: supple, no lymphadenopathy Lungs: clear to ascultation bilaterally, normal work of respiration, no wheezes, rales, ronchi Heart: regular rate and rhythm, no murmurs, gallops, or rubs Abdomen: soft, non-tender, non-distended, normal bowel sounds  Extremities: no cyanosis, clubbing, or edema Neurologic: alert & oriented X3, cranial nerves II-XII intact, strength grossly intact, sensation intact to light touch  Lab Results: Basic Metabolic Panel:  Lab 11/10/11 1610 11/09/11 0600 11/05/11 0545  NA 136 139 --  K 3.9 4.0 --  CL 101 103 --  CO2 26 24 --  GLUCOSE 100* 102* --  BUN 30* 33* --  CREATININE 0.93 0.82 --  CALCIUM 9.8 10.0 --  MG 2.3 -- 2.0  PHOS -- -- --   CBC:  Lab 11/10/11 0615 11/07/11 0525  WBC 7.4 8.2  NEUTROABS -- --  HGB 13.6 13.6  HCT 41.0 41.2  MCV 88.6 88.6  PLT 270 210    Medications: I have reviewed the patient's current medications. Scheduled Meds:   . albuterol  2.5 mg Nebulization Once  .  albuterol  2.5 mg Nebulization Once  . amLODipine  5 mg Oral Daily  . aspirin EC  81 mg Oral Daily  . carvedilol  12.5 mg Oral BID  . docusate sodium  100 mg Oral BID  . furosemide  40 mg Oral Daily  . ipratropium  0.5 mg Nebulization Once  . losartan  50 mg Oral Daily  . loteprednol  1 drop Right Eye QID  . pantoprazole  40 mg Oral Q1200  . simvastatin  20 mg Oral QHS  . spironolactone  12.5 mg Oral Daily  . Travoprost (BAK Free)  1 drop Right Eye QHS   Continuous Infusions:  PRN Meds:.acetaminophen, chlorpheniramine-HYDROcodone, guaiFENesin-dextromethorphan, ondansetron (ZOFRAN) IV  Assessment/Plan: 78 yo man, history of CAD, CHF, AS, SVT, OSA, presenting with syncope, found to have critical aortic stenosis, now considering PCI.   # Syncope: No significant event since admission. Syncope PTA was likely related to critical aortic stenosis.. S/p catheterization on 11/02/11, which revealed stenosis of LAD (95% ostial stenosis) and RCA (90% stenosis). 2D echo shows EF 30-35% with regional wall abnormality with critical stenosis of aortic valve (0.62cm2). Remains in asymptomatic sinus bradycardia. Patient initially decided to pursue palliative options, but now has decided to pursue PCI  --Coreg 12.5mg  BID  --Continue PT  --Appreciate cardiology's input, plan  for PCI next week   # CAD- s/p heart cath. Please see #1. Continue ASA and BB, statin   # Systolic CHF: Improving clinically with lasix.  Discussed lasix dosing with Dr. Antoine Poche, we will continue Lasix 40mg  po daily, and he will be followed outpatient by Dr. Antoine Poche. -continue lasix   # Essential hypertension: Well-controlled during this admission -- Continue Lasix 40 PO daily, Amlodipine 5mg  daily, Coreg, and Losartan   # Hypokalemia: resolved. Likely related to diuresis.   # Cough: No complaints this AM. Viral vs GERD. He remains afebrile, and no leukocytosis, therefore, antibiotics still not indicated.  -Will continue  symptomatic treatment and continue to monitor  -Continue protonix   # Dispo - plan for PCI sometime mid-next week     LOS: 10 days   [name] 11/10/2011, 1:06 PM

## 2011-11-11 ENCOUNTER — Inpatient Hospital Stay (HOSPITAL_COMMUNITY): Payer: Medicare Other

## 2011-11-11 DIAGNOSIS — R55 Syncope and collapse: Secondary | ICD-10-CM

## 2011-11-11 LAB — CBC
MCH: 29.5 pg (ref 26.0–34.0)
MCHC: 33 g/dL (ref 30.0–36.0)
MCV: 89.3 fL (ref 78.0–100.0)
Platelets: 267 10*3/uL (ref 150–400)
RDW: 14 % (ref 11.5–15.5)

## 2011-11-11 LAB — BASIC METABOLIC PANEL
CO2: 23 mEq/L (ref 19–32)
Calcium: 9.7 mg/dL (ref 8.4–10.5)
Creatinine, Ser: 0.76 mg/dL (ref 0.50–1.35)
GFR calc non Af Amer: 81 mL/min — ABNORMAL LOW (ref >90)
Glucose, Bld: 151 mg/dL — ABNORMAL HIGH (ref 70–99)

## 2011-11-11 LAB — DIFFERENTIAL
Basophils Absolute: 0.1 10*3/uL (ref 0.0–0.1)
Basophils Relative: 1 % (ref 0–1)
Eosinophils Absolute: 0.3 10*3/uL (ref 0.0–0.7)
Eosinophils Relative: 3 % (ref 0–5)
Lymphs Abs: 1.2 10*3/uL (ref 0.7–4.0)
Neutrophils Relative %: 75 % (ref 43–77)

## 2011-11-11 MED ORDER — SODIUM CHLORIDE 0.9 % IV SOLN: 250.0000 mL | INTRAVENOUS | Status: DC | PRN

## 2011-11-11 MED ORDER — CLOPIDOGREL BISULFATE 75 MG PO TABS
75.0000 mg | ORAL_TABLET | Freq: Every day | ORAL | Status: DC
  Administered 2011-11-11: 75 mg via ORAL
  Filled 2011-11-11 (×2): qty 1

## 2011-11-11 MED ORDER — SODIUM CHLORIDE 0.9 % IJ SOLN: 3.0000 mL | INTRAMUSCULAR | Status: DC | PRN

## 2011-11-11 MED ORDER — DIAZEPAM 2 MG PO TABS
2.0000 mg | ORAL_TABLET | ORAL | Status: AC
  Administered 2011-11-12: 2 mg via ORAL
  Filled 2011-11-11: qty 1

## 2011-11-11 MED ORDER — CLOPIDOGREL BISULFATE 300 MG PO TABS
300.0000 mg | ORAL_TABLET | Freq: Once | ORAL | Status: AC
  Administered 2011-11-12: 300 mg via ORAL
  Filled 2011-11-11 (×2): qty 1

## 2011-11-11 MED ORDER — LOTEPREDNOL ETABONATE 0.5 % OP SUSP
1.0000 [drp] | Freq: Every day | OPHTHALMIC | Status: DC
  Administered 2011-11-11 – 2011-11-16 (×6): 1 [drp] via OPHTHALMIC
  Filled 2011-11-11 (×2): qty 5

## 2011-11-11 MED ORDER — SODIUM CHLORIDE 0.9 % IJ SOLN
3.0000 mL | Freq: Two times a day (BID) | INTRAMUSCULAR | Status: DC
  Administered 2011-11-11 – 2011-11-12 (×3): 3 mL via INTRAVENOUS

## 2011-11-11 MED ORDER — CLOPIDOGREL BISULFATE 75 MG PO TABS
75.0000 mg | ORAL_TABLET | Freq: Every day | ORAL | Status: DC
  Administered 2011-11-13 – 2011-11-16 (×4): 75 mg via ORAL
  Filled 2011-11-11 (×5): qty 1

## 2011-11-11 MED ORDER — ASPIRIN EC 81 MG PO TBEC
81.0000 mg | DELAYED_RELEASE_TABLET | Freq: Every day | ORAL | Status: DC
  Administered 2011-11-13 – 2011-11-16 (×3): 81 mg via ORAL
  Filled 2011-11-11 (×4): qty 1

## 2011-11-11 MED ORDER — ASPIRIN 81 MG PO CHEW
324.0000 mg | CHEWABLE_TABLET | ORAL | Status: AC
  Administered 2011-11-12: 324 mg via ORAL
  Filled 2011-11-11: qty 4

## 2011-11-11 MED ORDER — SODIUM CHLORIDE 0.9 % IV SOLN
1.0000 mL/kg/h | INTRAVENOUS | Status: DC
  Administered 2011-11-12: 1 mL/kg/h via INTRAVENOUS

## 2011-11-11 NOTE — Progress Notes (Signed)
Placed patient on CPAP, via nasal mask, auto-titrate. Pt. Tolerating well at this time.

## 2011-11-11 NOTE — Progress Notes (Signed)
Subjective: No acute events overnight.  Patient's head wound has been oozing blood.  Patient continues to walk with PT.  No chest pain, SOB, dizziness, lightheadedness.  Objective: Vital signs in last 24 hours: Filed Vitals:   11/10/11 1004 11/10/11 1400 11/10/11 2208 11/11/11 0439  BP: 131/73 115/59 136/85 157/75  Pulse:  59 60 58  Temp:  97.9 F (36.6 C) 97.1 F (36.2 C) 97.4 F (36.3 C)  TempSrc:  Oral    Resp:  20 20 18   Height:      Weight:    204 lb 8 oz (92.761 kg)  SpO2:  96% 96% 94%   Weight change: 3 lb 11.2 oz (1.678 kg)  Intake/Output Summary (Last 24 hours) at 11/11/11 1159 Last data filed at 11/11/11 0734  Gross per 24 hour  Intake    660 ml  Output   2000 ml  Net  -1340 ml   Physical Exam: General: alert, cooperative, and in no apparent distress HEENT: large posterior head hematoma, dried blood clots around the head wound pupils equal round and reactive to light, vision grossly intact, oropharynx clear and non-erythematous  Neck: supple, no lymphadenopathy Lungs: clear to ascultation bilaterally, normal work of respiration, no wheezes, rales, ronchi Heart: regular rate and rhythm, no murmurs, gallops, or rubs Abdomen: soft, non-tender, non-distended, normal bowel sounds  Extremities: no cyanosis, clubbing, or edema Neurologic: alert & oriented X3, cranial nerves II-XII intact, strength grossly intact, sensation intact to light touch  Lab Results: Basic Metabolic Panel:  Lab 11/11/11 4540 11/10/11 0615 11/05/11 0545  NA 138 136 --  K 3.9 3.9 --  CL 103 101 --  CO2 23 26 --  GLUCOSE 151* 100* --  BUN 24* 30* --  CREATININE 0.76 0.93 --  CALCIUM 9.7 9.8 --  MG -- 2.3 2.0  PHOS -- -- --   CBC:  Lab 11/11/11 1035 11/10/11 0615  WBC 9.2 7.4  NEUTROABS 6.9 --  HGB 14.1 13.6  HCT 42.7 41.0  MCV 89.3 88.6  PLT 267 270    Medications: I have reviewed the patient's current medications. Scheduled Meds:    . albuterol  2.5 mg Nebulization Once  .  albuterol  2.5 mg Nebulization Once  . amLODipine  5 mg Oral Daily  . aspirin EC  81 mg Oral Daily  . carvedilol  12.5 mg Oral BID  . clopidogrel  75 mg Oral Q breakfast  . docusate sodium  100 mg Oral BID  . furosemide  40 mg Oral Daily  . ipratropium  0.5 mg Nebulization Once  . losartan  50 mg Oral Daily  . pantoprazole  40 mg Oral Q1200  . simvastatin  20 mg Oral QHS  . spironolactone  12.5 mg Oral Daily  . Travoprost (BAK Free)  1 drop Right Eye QHS  . DISCONTD: loteprednol  1 drop Right Eye QID   Continuous Infusions:  PRN Meds:.acetaminophen, chlorpheniramine-HYDROcodone, guaiFENesin-dextromethorphan, ondansetron (ZOFRAN) IV  Assessment/Plan: 78 yo man, history of CAD, CHF, AS, SVT, OSA, presenting with syncope, found to have critical aortic stenosis, now considering PCI.   # Syncope: No significant event since admission. Syncope PTA was likely related to critical aortic stenosis.. S/p catheterization on 11/02/11, which revealed stenosis of LAD (95% ostial stenosis) and RCA (90% stenosis). 2D echo shows EF 30-35% with regional wall abnormality with critical stenosis of aortic valve (0.62cm2). Remains in asymptomatic sinus bradycardia. Patient initially decided to pursue palliative options, but now has decided to pursue PCI  -  PCI/valvuloplasty tomorrow am  # CAD- s/p heart cath. Please see #1. Continue ASA and BB, statin   # Systolic CHF: Improving clinically with lasix.  Discussed lasix dosing with Dr. Antoine Poche, we will continue Lasix 40mg  po daily, and he will be followed outpatient by Dr. Antoine Poche. -continue lasix   # Essential hypertension: Well-controlled during this admission -- Continue Lasix 40 PO daily, Amlodipine 5mg  daily, Coreg, and Losartan   # Hypokalemia: resolved. Likely related to diuresis.   # Cough: No complaints this AM. Viral vs GERD. He remains afebrile, and no leukocytosis, therefore, antibiotics still not indicated.  -Will continue symptomatic  treatment and continue to monitor  -Continue protonix   # Dispo - Next week as per cardiology.    LOS: 11 days   Madisan Bice 11/11/2011, 11:59 AM

## 2011-11-11 NOTE — Progress Notes (Addendum)
Patient Name: Eugene [name] Date of Encounter: 11/11/2011  Principal Problem:  *Syncope and collapse Active Problems:  HYPERLIPIDEMIA  Essential hypertension, benign  Congestive heart failure  SVT (supraventricular tachycardia)  CAD (coronary artery disease)  Aortic stenosis    SUBJECTIVE: Pt denies any palpitations or presyncope/syncope. He has no awareness of a fast or slow HR. He denies CP/SOB. Feels he is getting stronger.  OBJECTIVE Filed Vitals:   11/10/11 1004 11/10/11 1400 11/10/11 2208 11/11/11 0439  BP: 131/73 115/59 136/85 157/75  Pulse:  59 60 58  Temp:  97.9 F (36.6 C) 97.1 F (36.2 C) 97.4 F (36.3 C)  TempSrc:  Oral    Resp:  20 20 18   Height:      Weight:    204 lb 8 oz (92.761 kg)  SpO2:  96% 96% 94%    Intake/Output Summary (Last 24 hours) at 11/11/11 5621 Last data filed at 11/11/11 0734  Gross per 24 hour  Intake    900 ml  Output   2260 ml  Net  -1360 ml   Weight change: 3 lb 11.2 oz (1.678 kg) Wt Readings from Last 3 Encounters:  11/11/11 204 lb 8 oz (92.761 kg)  11/11/11 204 lb 8 oz (92.761 kg)  07/24/11 219 lb 11.2 oz (99.655 kg)     PHYSICAL EXAM  General: Well developed, elderly male, in no acute distress. Head: Normocephalic, atraumatic.  Neck: Supple without bruits, JVD not significantly elevated. Lungs:  Resp regular and unlabored, rales but no crackles/rhonchi. Heart: RRR, S1, S2, no S3, S4, 2/6 murmurs. Abdomen: Soft, non-tender, non-distended, BS + x 4.  Extremities: No clubbing, cyanosis, no edema.  Neuro: Alert and oriented X 3. Moves all extremities spontaneously. Psych: Normal affect.  LABS:  CBC: Basename 11/10/11 0615  WBC 7.4  NEUTROABS --  HGB 13.6  HCT 41.0  MCV 88.6  PLT 270   INR:No results found for this basename: INR in the last 72 hours Basic Metabolic Panel: Basename 11/10/11 0615 11/09/11 0600  NA 136 139  K 3.9 4.0  CL 101 103  CO2 26 24  GLUCOSE 100* 102*  BUN 30* 33*  CREATININE 0.93  0.82  CALCIUM 9.8 10.0  MG 2.3 --  PHOS -- --   TELE:   SR/S brady plus about 20 minutes of tachycardia, HR 140s, ?atrial tach?     Echo: Study Conclusions - Left ventricle: The cavity size was severely dilated. Wall thickness was increased in a pattern of mild LVH. Systolic function was moderately to severely reduced. The estimated ejection fraction was in the range of 30% to 35%. Doppler parameters are consistent with elevated ventricular end-diastolic filling pressure. - Regional wall motion abnormality: Akinesis and scarring of the basal-mid inferolateral myocardium; akinesis of the basal-mid anterolateral and apical lateral myocardium; hypokinesis of the basal-mid inferoseptal, entire inferior, and apical septal myocardium; mild hypokinesis of the entire anterior, basal-mid anteroseptal, and apical Myocardium.  - Aortic valve: There was critical stenosis. Trivial regurgitation. Peak velocity: 410cm/s (S). Valve area: 0.6cm^2(VTI). Valve area: 0.62cm^2 (Vmax). - Mitral valve: Calcified annulus. Mild regurgitation. - Left atrium: The atrium was severely dilated.   Radiology/Studies: Dg Chest 2 View 11/04/2011  *RADIOLOGY REPORT*  Clinical Data: Cough, short of breath  CHEST - 2 VIEW  Comparison: Chest radiograph 10/31/2011  Findings: Stable enlarged cardiac silhouette.  There is a left lower lobe atelectasis.  There is central venous pulmonary congestion.  No evidence of focal consolidation.  No pneumothorax.  IMPRESSION:  1.  Cardiomegaly and  left lower lobe atelectasis is similar prior. 2.  Central venous pulmonary congestion is similar to prior.  Original Report Authenticated By: Genevive Bi, M.D.   Current Medications:    . albuterol  2.5 mg Nebulization Once  . albuterol  2.5 mg Nebulization Once  . amLODipine  5 mg Oral Daily  . aspirin EC  81 mg Oral Daily  . carvedilol  12.5 mg Oral BID  . docusate sodium  100 mg Oral BID  . furosemide  40 mg Oral Daily  . ipratropium   0.5 mg Nebulization Once  . losartan  50 mg Oral Daily  . loteprednol  1 drop Right Eye QID  . pantoprazole  40 mg Oral Q1200  . simvastatin  20 mg Oral QHS  . spironolactone  12.5 mg Oral Daily  . Travoprost (BAK Free)  1 drop Right Eye QHS    ASSESSMENT AND PLAN: 1. Syncope - telemetry reviewed - HR occ 30s and sustained in the 40s, not during the night and pt does not remember if he was awake or asleep then. He also does not remember any symptoms during the tachycardia - 140s. Continue current Coreg 12.5 BID and follow telem.  2. CAD/AS - plan per MD - per [facility] note 2/1, PCI/valvuloplasty planned - timing per MD.  3. ICM - Volume status at baseline on Lasix/spironolactone.   Signed, Theodore Demark , PA-C 8:22 AM 11/11/2011  Patient seen, examined. Available data reviewed. Agree with findings, assessment, and plan as outlined by Theodore Demark, PA-C. The patient has talked his situation over with multiple family members and has decided to move forward with balloon valvuloplasty and PCI of his LAD. I have again discussed risks, indication, and alternatives to aortic valvuloplasty and PCI of the prox LAD with this patient. He clearly understands and agrees to proceed. Our hope is that with these therapies he will be able to rehabilitate and become a candidate for more definitive therapy of his aortic valve disease (TAVR or surgical valve replacement). Will start plavix today and will plan on performing procedure after noon tomorrow.  Tonny Bollman, M.D. 11/11/2011 10:40 AM

## 2011-11-12 ENCOUNTER — Other Ambulatory Visit: Payer: Self-pay

## 2011-11-12 ENCOUNTER — Encounter: Payer: Self-pay | Admitting: *Deleted

## 2011-11-12 ENCOUNTER — Encounter (HOSPITAL_COMMUNITY)
Admission: EM | Disposition: A | Discharge status: Skilled Nursing Facility | Payer: Self-pay | Source: Ambulatory Visit | Attending: Internal Medicine

## 2011-11-12 DIAGNOSIS — I359 Nonrheumatic aortic valve disorder, unspecified: Secondary | ICD-10-CM

## 2011-11-12 DIAGNOSIS — I509 Heart failure, unspecified: Secondary | ICD-10-CM

## 2011-11-12 HISTORY — PX: LEFT HEART CATHETERIZATION WITH CORONARY ANGIOGRAM: SHX5451

## 2011-11-12 LAB — POCT ACTIVATED CLOTTING TIME
Activated Clotting Time: 204 seconds
Activated Clotting Time: 226 seconds

## 2011-11-12 LAB — CBC
MCH: 30.4 pg (ref 26.0–34.0)
MCV: 88.5 fL (ref 78.0–100.0)
Platelets: 286 10*3/uL (ref 150–400)
RDW: 14 % (ref 11.5–15.5)
WBC: 9.8 10*3/uL (ref 4.0–10.5)

## 2011-11-12 LAB — BASIC METABOLIC PANEL
CO2: 25 mEq/L (ref 19–32)
Calcium: 9.5 mg/dL (ref 8.4–10.5)
Creatinine, Ser: 0.88 mg/dL (ref 0.50–1.35)
GFR calc non Af Amer: 76 mL/min — ABNORMAL LOW (ref >90)
Sodium: 136 mEq/L (ref 135–145)

## 2011-11-12 LAB — POCT I-STAT 3, VENOUS BLOOD GAS (G3P V)
TCO2: 24 mmol/L (ref 0–100)
pCO2, Ven: 42.6 mmHg — ABNORMAL LOW (ref 45.0–50.0)
pH, Ven: 7.336 — ABNORMAL HIGH (ref 7.250–7.300)

## 2011-11-12 LAB — PROTIME-INR: INR: 1.02 (ref 0.00–1.49)

## 2011-11-12 LAB — APTT: aPTT: 32 seconds (ref 24–37)

## 2011-11-12 SURGERY — LEFT HEART CATHETERIZATION WITH CORONARY ANGIOGRAM
Anesthesia: LOCAL

## 2011-11-12 MED ORDER — SODIUM CHLORIDE 0.9 % IV SOLN
INTRAVENOUS | Status: DC
  Administered 2011-11-12: 16:00:00 via INTRAVENOUS

## 2011-11-12 MED ORDER — HEPARIN (PORCINE) IN NACL 2-0.9 UNIT/ML-% IJ SOLN
INTRAMUSCULAR | Status: AC
  Filled 2011-11-12: qty 2000

## 2011-11-12 MED ORDER — MIDAZOLAM HCL 2 MG/2ML IJ SOLN
INTRAMUSCULAR | Status: AC
  Filled 2011-11-12: qty 2

## 2011-11-12 MED ORDER — LIDOCAINE HCL (PF) 1 % IJ SOLN
INTRAMUSCULAR | Status: AC
  Filled 2011-11-12: qty 30

## 2011-11-12 MED ORDER — ONDANSETRON HCL 4 MG/2ML IJ SOLN
4.0000 mg | Freq: Four times a day (QID) | INTRAMUSCULAR | Status: DC | PRN
  Administered 2011-11-15: 4 mg via INTRAVENOUS
  Filled 2011-11-12: qty 2

## 2011-11-12 MED ORDER — ACETAMINOPHEN 325 MG PO TABS
650.0000 mg | ORAL_TABLET | ORAL | Status: DC | PRN
Start: 2011-11-12 — End: 2011-11-16
  Administered 2011-11-12 – 2011-11-15 (×6): 650 mg via ORAL
  Filled 2011-11-12 (×6): qty 2

## 2011-11-12 MED ORDER — NOREPINEPHRINE BITARTRATE 1 MG/ML IJ SOLN
INTRAMUSCULAR | Status: AC
  Filled 2011-11-12: qty 4

## 2011-11-12 MED ORDER — NITROGLYCERIN 0.2 MG/ML ON CALL CATH LAB
INTRAVENOUS | Status: AC
  Filled 2011-11-12: qty 1

## 2011-11-12 MED ORDER — METOPROLOL TARTRATE 1 MG/ML IV SOLN
INTRAVENOUS | Status: AC
  Filled 2011-11-12: qty 5

## 2011-11-12 MED ORDER — FENTANYL CITRATE 0.05 MG/ML IJ SOLN
INTRAMUSCULAR | Status: AC
  Filled 2011-11-12: qty 2

## 2011-11-12 MED ORDER — SODIUM CHLORIDE 0.9 % IV SOLN
INTRAVENOUS | Status: AC
  Administered 2011-11-12: 16:00:00 via INTRAVENOUS

## 2011-11-12 MED ORDER — HEPARIN SODIUM (PORCINE) 1000 UNIT/ML IJ SOLN
INTRAMUSCULAR | Status: AC
  Filled 2011-11-12: qty 1

## 2011-11-12 NOTE — Progress Notes (Signed)
PT Cancellation Note   Treatment cancelled today due to pt went for cardiac cath/stenting.  11/12/2011  Temple Bing, PT [phone] (pager)

## 2011-11-12 NOTE — H&P (View-Only) (Signed)
Subjective:  Didn't sleep well again last night. No chest pain or dyspnea.  Objective:  Vital Signs in the last 24 hours: Temp:  [97.8 F (36.6 C)-98.1 F (36.7 C)] 98.1 F (36.7 C) (02/04 0545) Pulse Rate:  [48-61] 56  (02/04 0545) Resp:  [18-20] 20  (02/04 0545) BP: (118-153)/(74-93) 151/93 mmHg (02/04 0545) SpO2:  [94 %-96 %] 95 % (02/04 0545)  Intake/Output from previous day: 02/03 0701 - 02/04 0700 In: 1060 [P.O.:1060] Out: 1702 [Urine:1700; Stool:2]  Physical Exam: Pt is alert and oriented, elderly male in NAD HEENT: normal Neck: JVP - normal Lungs: CTA bilaterally CV: brady with 3/6 systolic murmur RSB Abd: soft, NT, Positive BS, no hepatomegaly Ext: no C/C/E, distal pulses intact and equal Skin: warm/dry no rash   Lab Results:  Basename 11/12/11 0550 11/11/11 1035  WBC 9.8 9.2  HGB 14.3 14.1  PLT 286 267    Basename 11/12/11 0550 11/11/11 0950  NA 136 138  K 3.8 3.9  CL 102 103  CO2 25 23  GLUCOSE 93 151*  BUN 24* 24*  CREATININE 0.88 0.76   No results found for this basename: TROPONINI:2,CK,MB:2 in the last 72 hours  Tele: sinus brady/sinus rhythm  Assessment/Plan:  1. Severe aortic stenosis with acute on chronic systolic heart failure 2. Severe CAD 3. Ischemic cardiomyopathy  Plan for aortic balloon valvuloplasty today. If uncomplicated may proceed with PCI of the LAD today as well. Answered patient's questions. Risks, indications, alternatives reviewed with patient who understands and agrees to proceed.  Tonny Bollman, M.D. 11/12/2011, 8:58 AM

## 2011-11-12 NOTE — Progress Notes (Signed)
ACT @ 1745  Was 149.  Venous sheath pulled from rt groin at 1800.  Sheath intact.  Manual pressure applied to site for 15 minutes.  Site had slight bruising prior to sheath removal.  Site post sheath pull remained the same, slight bruising, but no hematoma present.  Pedal pulse on right foot 2+.  Occlusive dressing applied to site.  Pt given instructions to apply direct pressure to site with cough/sneeze/laughing.  Pt states understanding, demonstrates understanding.  Eliane Decree, RN, 11/12/2011, 6:21 PM

## 2011-11-12 NOTE — Interval H&P Note (Signed)
History and Physical Interval Note:  11/12/2011 1:02 PM  Eugene [name]  has presented today for surgery, with the diagnosis of chest pain  The various methods of treatment have been discussed with the patient and family. After consideration of risks, benefits and other options for treatment, the patient has consented to  Procedure(s): CORONARY ANGIOGRAM as a surgical intervention .  The patients' history has been reviewed, patient examined, no change in status, stable for surgery.  I have reviewed the patients' chart and labs.  Questions were answered to the patient's satisfaction.     Tonny Bollman

## 2011-11-12 NOTE — Procedures (Signed)
Cardiac Catheterization Procedure Note  Name: Eugene [name] MRN: [id number] DOB: 1923/11/05  Procedure: Right heart catheterization, temporary transvenous pacemaker placement, left ventriculography, balloon aortic valvuloplasty.  Indication: Severe aortic stenosis with congestive heart failure   Procedural details: The right groin was prepped, draped, and anesthetized with 1% lidocaine. Using modified Seldinger technique, a 5 French sheath was introduced into the right femoral artery. A 7 French sheath was introduced into the right femoral vein. A right femoral artery angiogram was performed and this showed access into the common femoral artery and a good site. 2 Perclose devices were deployed. The sheath was then upsized to a 12 [place]. A Swan-Ganz catheter was advanced in the right heart catheterization was performed. The Swan-Ganz catheter was removed and a temporary transvenous pacing wire was advanced into the right ventricular apex. At that point attention was turned to the aortic valve. The pacing threshold had been determined that 0.4 milliamps. Using an AL-1 catheter, a straight wire was advanced across the aortic valve without much difficulty into the LV apex. A pigtail catheter was used to cross the valve and left ventriculography was performed.  I tried to advance an Amplatz superstiff J-wire into the LV apex but I could not get positioned properly. Several attempts were made and the pigtail catheter was used as well. Pad across the valve on a few different occasions in order to get proper position. Ultimately, I used a straight Amplatz wire with a curved and was able to place it in the LV apex in good position. At that point a 22 x 50 mm Nucleus balloon was advanced across the aortic valve. The patient was paced at 180 beats per minute. Double and was inflated by hand pressure on 2 separate occasions. The patient tolerated the inflations well. There were no immediate complications in his blood  pressure returned to normal fairly quickly. The 12 French sheath had to be replaced in the right femoral artery because it was difficult to get the balloon now. This was changed out to a new 12 French sheath over the Amplatz stiff wire. The aortic valve was crossed one more time and simultaneous pressures were recorded. A pullback across the aortic valve was done. The pigtail catheter was removed, the temporary pacemaker wire was removed, and the Perclose devices were deployed for femoral hemostasis. There were no immediate procedural complications.  Procedural Findings: Hemodynamics:  RA 3 RV 30/4 PA 27/8 with a mean of 21  pulmonary capillary wedge pressure 9 AO 130/57 LV 168/13  Post balloon valvuloplasty AO 138/61 LV 163/9   Left ventriculography: There is severe hypokinesis/akinesis of the basal and mid inferior walls. The anterior wall contracts normally. There is mild mitral regurgitation. There is heavy calcification of the aortic valve and coronary arteries.  Final Conclusions:  Successful balloon aortic valvuloplasty as detailed above  Recommendations: The patient will continue on aspirin and Plavix. He will return for PCI of the LAD, probably on Wednesday, February 6.  Tonny Bollman 11/12/2011, 3:02 PM

## 2011-11-12 NOTE — Progress Notes (Signed)
Subjective:  Didn't sleep well again last night. No chest pain or dyspnea.  Objective:  Vital Signs in the last 24 hours: Temp:  [97.8 F (36.6 C)-98.1 F (36.7 C)] 98.1 F (36.7 C) (02/04 0545) Pulse Rate:  [48-61] 56  (02/04 0545) Resp:  [18-20] 20  (02/04 0545) BP: (118-153)/(74-93) 151/93 mmHg (02/04 0545) SpO2:  [94 %-96 %] 95 % (02/04 0545)  Intake/Output from previous day: 02/03 0701 - 02/04 0700 In: 1060 [P.O.:1060] Out: 1702 [Urine:1700; Stool:2]  Physical Exam: Pt is alert and oriented, elderly male in NAD HEENT: normal Neck: JVP - normal Lungs: CTA bilaterally CV: brady with 3/6 systolic murmur RSB Abd: soft, NT, Positive BS, no hepatomegaly Ext: no C/C/E, distal pulses intact and equal Skin: warm/dry no rash   Lab Results:  Basename 11/12/11 0550 11/11/11 1035  WBC 9.8 9.2  HGB 14.3 14.1  PLT 286 267    Basename 11/12/11 0550 11/11/11 0950  NA 136 138  K 3.8 3.9  CL 102 103  CO2 25 23  GLUCOSE 93 151*  BUN 24* 24*  CREATININE 0.88 0.76   No results found for this basename: TROPONINI:2,CK,MB:2 in the last 72 hours  Tele: sinus brady/sinus rhythm  Assessment/Plan:  1. Severe aortic stenosis with acute on chronic systolic heart failure 2. Severe CAD 3. Ischemic cardiomyopathy  Plan for aortic balloon valvuloplasty today. If uncomplicated may proceed with PCI of the LAD today as well. Answered patient's questions. Risks, indications, alternatives reviewed with patient who understands and agrees to proceed.  Mandi Mattioli, M.D. 11/12/2011, 8:58 AM     

## 2011-11-12 NOTE — Progress Notes (Signed)
Clinical Social Work-CSW continues to follow, reviewed cardiology plan and will continue to follow for d/c planning as well as psychosocial support for pt and family/HCPOA-TRUE Garciamartinez-MSW, [phone]

## 2011-11-12 NOTE — Progress Notes (Addendum)
Subjective: No acute events overnight.  The patient notes mild self-limited nausea this am, which he attributes to not eating since yesterday.  Objective: Vital signs in last 24 hours: Filed Vitals:   11/11/11 2149 11/11/11 2150 11/11/11 2324 11/12/11 0545  BP: 153/78   151/93  Pulse: 48 59 61 56  Temp: 98 F (36.7 C)   98.1 F (36.7 C)  TempSrc: Oral   Oral  Resp: 19  18 20   Height:      Weight:      SpO2: 96%  95% 95%   Weight change:   Intake/Output Summary (Last 24 hours) at 11/12/11 1118 Last data filed at 11/12/11 0827  Gross per 24 hour  Intake    700 ml  Output   1151 ml  Net   -451 ml   Physical Exam: General: alert, cooperative, and in no apparent distress HEENT: large posterior head hematoma, pupils equal round and reactive to light, vision grossly intact, oropharynx clear and non-erythematous  Neck: supple, no lymphadenopathy Lungs: clear to ascultation bilaterally, normal work of respiration, no wheezes, rales, ronchi Heart: regular rate and rhythm, no murmurs, gallops, or rubs Abdomen: soft, non-tender, non-distended, normal bowel sounds  Extremities: no cyanosis, clubbing, or edema Neurologic: alert & oriented X3, cranial nerves II-XII intact, strength grossly intact, sensation intact to light touch  Lab Results: Basic Metabolic Panel:  Lab 11/12/11 1610 11/11/11 0950 11/10/11 0615  NA 136 138 --  K 3.8 3.9 --  CL 102 103 --  CO2 25 23 --  GLUCOSE 93 151* --  BUN 24* 24* --  CREATININE 0.88 0.76 --  CALCIUM 9.5 9.7 --  MG -- -- 2.3  PHOS -- -- --   CBC:  Lab 11/12/11 0550 11/11/11 1035  WBC 9.8 9.2  NEUTROABS -- 6.9  HGB 14.3 14.1  HCT 41.7 42.7  MCV 88.5 89.3  PLT 286 267    Medications: I have reviewed the patient's current medications. Scheduled Meds:   . albuterol  2.5 mg Nebulization Once  . albuterol  2.5 mg Nebulization Once  . amLODipine  5 mg Oral Daily  . aspirin  324 mg Oral Pre-Cath  . aspirin EC  81 mg Oral Daily  .  carvedilol  12.5 mg Oral BID  . clopidogrel  300 mg Oral Once  . clopidogrel  75 mg Oral Q breakfast  . diazepam  2 mg Oral On Call  . docusate sodium  100 mg Oral BID  . furosemide  40 mg Oral Daily  . ipratropium  0.5 mg Nebulization Once  . losartan  50 mg Oral Daily  . loteprednol  1 drop Right Eye Daily  . pantoprazole  40 mg Oral Q1200  . simvastatin  20 mg Oral QHS  . sodium chloride  3 mL Intravenous Q12H  . spironolactone  12.5 mg Oral Daily  . Travoprost (BAK Free)  1 drop Right Eye QHS  . DISCONTD: aspirin EC  81 mg Oral Daily  . DISCONTD: clopidogrel  75 mg Oral Q breakfast   Continuous Infusions:   . sodium chloride 1 mL/kg/hr (11/12/11 0359)   PRN Meds:.sodium chloride, acetaminophen, chlorpheniramine-HYDROcodone, guaiFENesin-dextromethorphan, ondansetron (ZOFRAN) IV, sodium chloride  Assessment/Plan: 78 yo man, history of CAD, CHF, AS, SVT, OSA, presenting with syncope, found to have critical aortic stenosis, plan for aortic balloon valvuloplasty today.   # Syncope: No significant event since admission. Syncope PTA was likely related to critical aortic stenosis.. S/p catheterization on 11/02/11, which revealed  stenosis of LAD (95% ostial stenosis) and RCA (90% stenosis). 2D echo shows EF 30-35% with regional wall abnormality with critical stenosis of aortic valve (0.62cm2). Remains in asymptomatic sinus bradycardia. Patient initially decided to pursue palliative options, but now plan for valvuloplasty today --Coreg 12.5mg  BID  --Continue PT  --Appreciate cardiology's input, plan for valvuloplasty today  # CAD- s/p heart cath. Please see #1. Continue ASA and BB, statin   # Systolic CHF: Improving clinically with lasix. Discussed lasix dosing with Dr. Antoine Poche, we will continue Lasix 40mg  po daily, and he will be followed outpatient by Dr. Antoine Poche.  -continue lasix   # Essential hypertension: Well-controlled during this admission  -- Continue Lasix 40 PO daily,  Amlodipine 5mg  daily, Coreg, and Losartan   # Hypokalemia: resolved. Likely related to diuresis.     LOS: 12 days   [name] 11/12/2011, 11:18 AM  Internal Medicine Teaching Service Attending Note Date: 11/12/2011  Patient name: Eugene [name]  Medical record number: [id number]  Date of birth: 1924/04/01    This patient has been seen and discussed with Dr. Manson Passey. Please see his note for complete details. I concur with their findings and await the results from today's cardiac valvuloplasty procedure to be performed by Dr. Excell Seltzer.  Judyann Munson 11/12/2011, 3:37 PM

## 2011-11-12 NOTE — Progress Notes (Signed)
  Echocardiogram  2D Echocardiogram (stat) has been performed.  Eugene [name] [facility] 11/12/2011, 4:50 PM

## 2011-11-13 ENCOUNTER — Ambulatory Visit: Payer: Medicare Other | Admitting: Internal Medicine

## 2011-11-13 DIAGNOSIS — I471 Supraventricular tachycardia: Secondary | ICD-10-CM

## 2011-11-13 DIAGNOSIS — R42 Dizziness and giddiness: Secondary | ICD-10-CM

## 2011-11-13 DIAGNOSIS — R55 Syncope and collapse: Secondary | ICD-10-CM

## 2011-11-13 DIAGNOSIS — I10 Essential (primary) hypertension: Secondary | ICD-10-CM

## 2011-11-13 LAB — BASIC METABOLIC PANEL
CO2: 25 mEq/L (ref 19–32)
Calcium: 9.3 mg/dL (ref 8.4–10.5)
Creatinine, Ser: 0.78 mg/dL (ref 0.50–1.35)
GFR calc Af Amer: >90 mL/min (ref >90)
GFR calc non Af Amer: 80 mL/min — ABNORMAL LOW (ref >90)
Sodium: 139 mEq/L (ref 135–145)

## 2011-11-13 LAB — CBC
MCH: 29.8 pg (ref 26.0–34.0)
MCHC: 33.2 g/dL (ref 30.0–36.0)
MCV: 89.9 fL (ref 78.0–100.0)
Platelets: 286 10*3/uL (ref 150–400)
RDW: 14.3 % (ref 11.5–15.5)

## 2011-11-13 MED ORDER — SODIUM CHLORIDE 0.9 % IV SOLN: 250.0000 mL | INTRAVENOUS | Status: DC | PRN

## 2011-11-13 MED ORDER — ASPIRIN 81 MG PO CHEW
324.0000 mg | CHEWABLE_TABLET | ORAL | Status: AC
  Administered 2011-11-14: 324 mg via ORAL
  Filled 2011-11-13: qty 4

## 2011-11-13 MED ORDER — SODIUM CHLORIDE 0.9 % IJ SOLN
3.0000 mL | INTRAMUSCULAR | Status: DC | PRN
  Administered 2011-11-13: 3 mL via INTRAVENOUS

## 2011-11-13 MED ORDER — WHITE PETROLATUM GEL
Status: AC
  Administered 2011-11-13: 10:00:00
  Filled 2011-11-13: qty 5

## 2011-11-13 MED ORDER — METOPROLOL TARTRATE 1 MG/ML IV SOLN
5.0000 mg | INTRAVENOUS | Status: DC | PRN
  Administered 2011-11-13: 5 mg via INTRAVENOUS

## 2011-11-13 MED ORDER — SODIUM CHLORIDE 0.9 % IJ SOLN
3.0000 mL | Freq: Two times a day (BID) | INTRAMUSCULAR | Status: DC
  Administered 2011-11-13 – 2011-11-14 (×2): 3 mL via INTRAVENOUS

## 2011-11-13 MED ORDER — METOPROLOL TARTRATE 1 MG/ML IV SOLN
INTRAVENOUS | Status: AC
  Administered 2011-11-13: 5 mg via INTRAVENOUS
  Filled 2011-11-13: qty 5

## 2011-11-13 MED ORDER — DIAZEPAM 2 MG PO TABS
2.0000 mg | ORAL_TABLET | ORAL | Status: AC
  Administered 2011-11-14: 2 mg via ORAL
  Filled 2011-11-13: qty 1

## 2011-11-13 MED ORDER — SODIUM CHLORIDE 0.9 % IV SOLN
1.0000 mL/kg/h | INTRAVENOUS | Status: DC
  Administered 2011-11-14: 1 mL/kg/h via INTRAVENOUS

## 2011-11-13 NOTE — Progress Notes (Addendum)
Subjective: S/p balloon valvuloplasty yesterday, with no complications.  Overnight, the patient experienced an episode of tachycardia to the 110-130's, which spontaneously converted to sinus brady to 39-55.  This morning the patient is found to be in tachycardia to the 130's.  The patient notes some mild lightheadedness that has been constant overnight and this morning (doesn't note increased LH during episodes of tachy/brady), but no chest pain, SOB, dyspnea, palpitations, or presyncope.  Objective: Vital signs in last 24 hours: Filed Vitals:   11/13/11 0400 11/13/11 0439 11/13/11 0500 11/13/11 0600  BP: 153/83  133/63 178/91  Pulse: 63  54 61  Temp:  98.4 F (36.9 C)    TempSrc:  Oral    Resp: 20  16 18   Height:      Weight:   210 lb 5.1 oz (95.4 kg)   SpO2: 95%  93% 100%   Weight change:   Intake/Output Summary (Last 24 hours) at 11/13/11 0807 Last data filed at 11/13/11 0600  Gross per 24 hour  Intake    900 ml  Output   2225 ml  Net  -1325 ml   Physical Exam: General: alert, cooperative, and in no apparent distress HEENT: large posterior head hematoma, pupils equal round and reactive to light, vision grossly intact, oropharynx clear and non-erythematous Neck: supple, no lymphadenopathy Lungs: clear to ascultation bilaterally, normal work of respiration, no wheezes, rales, ronchi Heart: regular rate and rhythm, no murmurs, gallops, or rubs Abdomen: soft, non-tender, non-distended, normal bowel sounds  Extremities: no cyanosis, clubbing, or edema Neurologic: alert & oriented X3, cranial nerves II-XII intact, strength grossly intact, sensation intact to light touch  Lab Results: Basic Metabolic Panel:  Lab 11/13/11 1610 11/12/11 0550 11/10/11 0615  NA 139 136 --  K 4.1 3.8 --  CL 106 102 --  CO2 25 25 --  GLUCOSE 95 93 --  BUN 18 24* --  CREATININE 0.78 0.88 --  CALCIUM 9.3 9.5 --  MG -- -- 2.3  PHOS -- -- --   CBC:  Lab 11/13/11 0559 11/12/11 0550 11/11/11 1035   WBC 9.1 9.8 --  NEUTROABS -- -- 6.9  HGB 13.6 14.3 --  HCT 41.0 41.7 --  MCV 89.9 88.5 --  PLT 286 286 --    2D Echo 11/12/11 Study Conclusions - Left ventricle: The cavity size was normal. Wall thickness was increased in a pattern of severe LVH. Systolic function was moderately to severely reduced. The estimated ejection fraction was in the range of 30% to 35%. Diffuse hypokinesis. There is akinesis of the posterior myocardium. There is severe hypokinesis of the inferolateral myocardium. - Aortic valve: Valve mobility was restricted. There was severe stenosis. Mild regurgitation. Valve area: 0.7cm^2(VTI). Valve area: 0.63cm^2 (Vmax). - Mitral valve: Calcified annulus. Mild regurgitation. - Left atrium: The atrium was mildly dilated.    Medications: I have reviewed the patient's current medications. Scheduled Meds:    . albuterol  2.5 mg Nebulization Once  . albuterol  2.5 mg Nebulization Once  . amLODipine  5 mg Oral Daily  . aspirin EC  81 mg Oral Daily  . carvedilol  12.5 mg Oral BID  . clopidogrel  75 mg Oral Q breakfast  . diazepam  2 mg Oral On Call  . docusate sodium  100 mg Oral BID  . fentaNYL      . furosemide  40 mg Oral Daily  . heparin      . heparin      . ipratropium  0.5 mg Nebulization Once  . lidocaine      . losartan  50 mg Oral Daily  . loteprednol  1 drop Right Eye Daily  . metoprolol      . metoprolol      . metoprolol      . midazolam      . nitroGLYCERIN      . norepinephrine      . pantoprazole  40 mg Oral Q1200  . simvastatin  20 mg Oral QHS  . spironolactone  12.5 mg Oral Daily  . Travoprost (BAK Free)  1 drop Right Eye QHS  . DISCONTD: sodium chloride  3 mL Intravenous Q12H   Continuous Infusions:    . sodium chloride Stopped (11/12/11 2255)  . sodium chloride Stopped (11/12/11 1800)  . DISCONTD: sodium chloride 1 mL/kg/hr (11/12/11 0359)   PRN Meds:.acetaminophen, chlorpheniramine-HYDROcodone, guaiFENesin-dextromethorphan,  metoprolol, ondansetron (ZOFRAN) IV, DISCONTD: sodium chloride, DISCONTD: acetaminophen, DISCONTD: ondansetron (ZOFRAN) IV, DISCONTD: sodium chloride  Assessment/Plan: 78 yo man, history of CAD, CHF, AS, SVT, OSA, presenting with syncope, found to have critical aortic stenosis, plan for aortic balloon valvuloplasty today.   # Aortic stenosis/CAD:  2D echo shows EF 30-35% with regional wall abnormality with critical stenosis of aortic valve (0.62cm2).  Catheterization on 11/02/11, which revealed stenosis of LAD (95% ostial stenosis) and RCA (90% stenosis).  POD #1 s/p aortic balloon valvuloplasty without complications, though with repeat Echo showing only modest improvement (Vmax = 0.63 cm2) despite 2 balloon dilations (likely reflecting the severity of the patient's underlying stenosis). --Coreg 12.5mg  BID  --Continue PT  --greatly appreciate cardiology's management, plan for PCI of LAD tomorrow --continue aspirin, statin  # SVT/sinus brady - the patient had 2 further episodes of tachycardia/bradycardia overnight.  EKG this am shows likely atrial tachycardia. -1 dose lopressor this am -continue coreg -appreciate cardiology's assistance with further management  # Systolic CHF: Improving clinically with lasix. Discussed lasix dosing with Dr. Antoine Poche, we will continue Lasix 40mg  po daily, and he will be followed outpatient by Dr. Antoine Poche.  -continue lasix   # Essential hypertension: Well-controlled during this admission  -- Continue Lasix 40 PO daily, Amlodipine 5mg  daily, Coreg, and Losartan     LOS: 13 days   [name] 11/13/2011, 8:07 AM Internal Medicine Teaching Service Attending Note Date: 11/13/2011  Patient name: FADEL CLASON  Medical record number: [id number]  Date of birth: December 29, 1923    This patient has been seen and discussed with Dr. Manson Passey. Please see his note for complete details. I concur with his findings. In summary, Mr. Dull underwent AV valvuloplasty with slight  improvement in aortic valve hemodynamics in comparison to echo measurements, but still severe AS. Patient is to undergo left heart catheterization tomorrow to attempt to open 95% LAD stenosis. We appreciate Dr. Earmon Phoenix management. Patient was noted to have tachycardic episode, slightly symptomatic per patient report. Hemodynamically stable. Patient is to be evaluated by EP cardiologist, Dr Clide Cliff to see how we can better optimize his regimen vs. candidate for pacemaker. On physical exam, no evidence of bruit of left groin. Anticipate that patient will need SNF after all procedures completed.  Duke Salvia Drue Second MD MPH [facility] [phone] 11/13/11 at 1:40pm

## 2011-11-13 NOTE — Progress Notes (Addendum)
Subjective:  Feels 'sleepy' but no specific complaints. No chest pain or dyspnea.   Objective:  Vital Signs in the last 24 hours: Temp:  [97.7 F (36.5 C)-98.5 F (36.9 C)] 98.4 F (36.9 C) (02/05 0439) Pulse Rate:  [47-124] 61  (02/05 0600) Resp:  [13-23] 18  (02/05 0600) BP: (116-178)/(22-91) 178/91 mmHg (02/05 0600) SpO2:  [92 %-100 %] 100 % (02/05 0600) Weight:  [95.4 kg (210 lb 5.1 oz)] 95.4 kg (210 lb 5.1 oz) (02/05 0500)  Intake/Output from previous day: 02/04 0701 - 02/05 0700 In: 900 [P.O.:360; I.V.:540] Out: 2225 [Urine:2225]  Physical Exam: Pt is alert and oriented, NAD HEENT: normal Neck: JVP - normal, carotids 2+= without bruits Lungs: CTA bilaterally CV: RRR with 3/6 systolic murmur RUSB Abd: soft, NT, Positive BS, no hepatomegaly Ext: no C/C/E, right groin site clear, distal pulses intact and equal Skin: warm/dry no rash   Lab Results:  Basename 11/13/11 0559 11/12/11 0550  WBC 9.1 9.8  HGB 13.6 14.3  PLT 286 286    Basename 11/12/11 0550 11/11/11 0950  NA 136 138  K 3.8 3.9  CL 102 103  CO2 25 23  GLUCOSE 93 151*  BUN 24* 24*  CREATININE 0.88 0.76   No results found for this basename: TROPONINI:2,CK,MB:2 in the last 72 hours  Cardiac Studies: 2D Echo follow-up: Study Conclusions  - Left ventricle: The cavity size was normal. Wall thickness was increased in a pattern of severe LVH. Systolic function was moderately to severely reduced. The estimated ejection fraction was in the range of 30% to 35%. Diffuse hypokinesis. There is akinesis of the posterior myocardium. There is severe hypokinesis of the inferolateral myocardium. - Aortic valve: Valve mobility was restricted. There was severe stenosis. Mild regurgitation. Valve area: 0.7cm^2(VTI). Valve area: 0.63cm^2 (Vmax). - Mitral valve: Calcified annulus. Mild regurgitation. - Left atrium: The atrium was mildly dilated.  Tele: Sinus brady, run of atrial tach approx 125  bpm  Assessment/Plan:  1. Severe aortic stenosis with acute on chronic systolic heart failure  2. Severe CAD  3. Ischemic cardiomyopathy  4. Deconditioning 5. SVT - pt has been back and forth btwn sinus brady and atrial tach at 125-130 bpm. Will give IV lopressor now and will ask Dr Graciela Husbands to see him (he has seen in office in past).  Slight improvement in aortic valve hemodynamics following valvuloplasty, but still severe AS. Plan PCI of the LAD tomorrow. Main issue will be PT/rehab as this will be important in determining if he is a candidate for other therapies (AVR). Otherwise the patient is stable. His hemodynamics were excellent on right heart cath yesterday. Will need SNF for rehab.  Tonny Bollman, M.D. 11/13/2011, 7:20 AM

## 2011-11-13 NOTE — Progress Notes (Signed)
Received order however it sts "if PCI, CR". Pt did not have PCI so therefore not appropriate. Please order CR if ambulation warranted. Thx. Ethelda Chick CES, ACSM

## 2011-11-13 NOTE — Progress Notes (Addendum)
Physical Therapy Treatment Patient Details Name: Eugene [name] MRN: [id number] DOB: 07/02/1924 Today's Date: 11/13/2011  PT Assessment/Plan  PT - Assessment/Plan Comments on Treatment Session: Patient admitted with syncope.  Patient had a cath with stent placed and to get another stent placed tomorrow per chart.   PT Plan: Discharge plan remains appropriate;Frequency remains appropriate PT Frequency: Min 3X/week Follow Up Recommendations: Skilled nursing facility;Supervision/Assistance - 24 hour Equipment Recommended: Defer to next venue PT Goals  Acute Rehab PT Goals PT Goal Formulation: With patient PT Goal: Supine/Side to Sit - Progress: Progressing toward goal PT Goal: Sit to Supine/Side - Progress: Progressing toward goal PT Goal: Sit to Stand - Progress: Progressing toward goal PT Goal: Stand to Sit - Progress: Progressing toward goal PT Goal: Ambulate - Progress: Progressing toward goal  PT Treatment Precautions/Restrictions  Precautions Precautions: Fall Required Braces or Orthoses: No Restrictions Weight Bearing Restrictions: No Mobility (including Balance) Bed Mobility Supine to Sit: 5: Supervision;With rails;HOB flat Supine to Sit Details (indicate cue type and reason): took incr time for patient to get to EOB Sitting - Scoot to Edge of Bed: 5: Supervision Sit to Supine: Not Tested (comment) Transfers Sit to Stand: 5: Supervision;From bed;From elevated surface;With upper extremity assist Sit to Stand Details (indicate cue type and reason): cuing for safety and hand placement Stand to Sit: With upper extremity assist;With armrests;4: Min assist;To chair/3-in-1 Stand to Sit Details: pt. was fatigued and had slight uncontrolled descent into chair.   Ambulation/Gait : 150 feet Ambulation/Gait Assistance: 4: Min assist Ambulation/Gait Assistance Details (indicate cue type and reason): needed verbal cues to stay close to RW as he was pushing it too far in front of him and  cues to stand upright.  Min guard assist for safety. Assistive device: Rolling walker Gait Pattern: Decreased step length - right;Decreased step length - left;Trunk flexed Stairs: No Wheelchair Mobility Wheelchair Mobility: No  Posture/Postural Control Posture/Postural Control: Postural limitations Postural Limitations: flexed trunk with postural cues cannot achieve full upright stance Static Sitting Balance Static Sitting - Level of Assistance: 6: Modified independent (Device/Increase time) Static Standing Balance Static Standing - Level of Assistance: 5: Stand by assistance Exercise    End of Session PT - End of Session Equipment Utilized During Treatment: Gait belt Activity Tolerance: Patient limited by fatigue Patient left: in chair;with call bell in reach Nurse Communication: Mobility status for transfers;Mobility status for ambulation General Behavior During Session: [facility] for tasks performed Cognition: [facility] for tasks performed  INGOLD,Kaulin Chaves 11/13/2011, 1:16 PM  [facility] [phone] (pager)

## 2011-11-13 NOTE — Progress Notes (Signed)
Clinical Social Work-CSW contacted [facility] and relayed the pt surgical plan/schedule-while [facility] unable to hold bed they have patient on list for admission Friday pending bed availability. CSW will continue to follow and facilitate d/c. Jodean Lima, [phone]

## 2011-11-13 NOTE — Progress Notes (Signed)
SUBJECTIVE: The patient is stable today.  He has been having occasional episodes of SVT for which I am asked to see him this afternoon.  At this time, he denies chest pain, shortness of breath, or any new concerns.  He is asymptomatic with SVT.  He is scheduled for PCI tomorrow.     Marland Kitchen albuterol  2.5 mg Nebulization Once  . albuterol  2.5 mg Nebulization Once  . amLODipine  5 mg Oral Daily  . aspirin  324 mg Oral Pre-Cath  . aspirin EC  81 mg Oral Daily  . carvedilol  12.5 mg Oral BID  . clopidogrel  75 mg Oral Q breakfast  . diazepam  2 mg Oral On Call  . docusate sodium  100 mg Oral BID  . furosemide  40 mg Oral Daily  . ipratropium  0.5 mg Nebulization Once  . losartan  50 mg Oral Daily  . loteprednol  1 drop Right Eye Daily  . pantoprazole  40 mg Oral Q1200  . simvastatin  20 mg Oral QHS  . sodium chloride  3 mL Intravenous Q12H  . spironolactone  12.5 mg Oral Daily  . Travoprost (BAK Free)  1 drop Right Eye QHS  . white petrolatum      . DISCONTD: sodium chloride  3 mL Intravenous Q12H      . sodium chloride Stopped (11/12/11 2255)  . sodium chloride Stopped (11/12/11 1800)  . sodium chloride    . DISCONTD: sodium chloride 1 mL/kg/hr (11/12/11 0359)    OBJECTIVE: Physical Exam: Filed Vitals:   11/13/11 0500 11/13/11 0600 11/13/11 0800 11/13/11 1307  BP: 133/63 178/91    Pulse: 54 61  80  Temp:   98.3 F (36.8 C)   TempSrc:      Resp: 16 18    Height:      Weight: 210 lb 5.1 oz (95.4 kg)     SpO2: 93% 100%  94%    Intake/Output Summary (Last 24 hours) at 11/13/11 1428 Last data filed at 11/13/11 1300  Gross per 24 hour  Intake   1200 ml  Output   2025 ml  Net   -825 ml    Telemetry reveals sinus rhythm with intermittent episodes of SVT  GEN- The patient is elderly appearing, alert and oriented x 3 today.   Head- + lac on occiput Eyes-  Sclera clear, conjunctiva pink Ears- hearing intact Oropharynx- clear Neck- supple, no JVP Lungs- Clear to  ausculation bilaterally, normal work of breathing Heart- Regular rate and rhythm,  Decreased HS GI- soft, NT, ND, + BS Extremities- no clubbing, cyanosis, + dependant edema  ekg reveals mid RP narrow complex tachycardia (SVT)  LABS: Basic Metabolic Panel:  Basename 11/13/11 0559 11/12/11 0550  NA 139 136  K 4.1 3.8  CL 106 102  CO2 25 25  GLUCOSE 95 93  BUN 18 24*  CREATININE 0.78 0.88  CALCIUM 9.3 9.5  MG -- --  PHOS -- --   Liver Function Tests: No results found for this basename: AST:2,ALT:2,ALKPHOS:2,BILITOT:2,PROT:2,ALBUMIN:2 in the last 72 hours No results found for this basename: LIPASE:2,AMYLASE:2 in the last 72 hours CBC:  Basename 11/13/11 0559 11/12/11 0550 11/11/11 1035  WBC 9.1 9.8 --  NEUTROABS -- -- 6.9  HGB 13.6 14.3 --  HCT 41.0 41.7 --  MCV 89.9 88.5 --  PLT 286 286 --     ASSESSMENT AND PLAN:  Principal Problem:  *Syncope and collapse Active Problems:  HYPERLIPIDEMIA  Essential  hypertension, benign  Congestive heart failure  SVT (supraventricular tachycardia)  CAD (coronary artery disease)  Aortic stenosis  1.  SVT- the patient has a h/o SVT for which he has previously been evaluated by Dr Graciela Husbands.  He has been asymptomatic with this previously.  At this time, I am reluctant to advised aggressive management given the patients overall condition presently.  The patient would like to avoid catheter ablation if possible.  I agree with this approach.  We will continue to treat with coreg and follow.  2.  Syncope-  The cause for his syncope remains unclear.  One possibility (as Dr Graciela Husbands has raised) is that he tripped and fell, then suffering concussion.  He was carrying milk as well as his cane while walking up the stairs and I think that this is a reasonable consideration.  Given his critical AS and severe CAD, exertional cardiac syncope is also a possibility.  He is now s/p aortic valvuloplasty and has planned PCI tomorrow. Given recent treatment of  valvular disease and planned revascularization, I would think that EP study at this point could be deferred.  We will continue to monitor telemetry.  Given his advanced age and co morbidities, he would be a poor candidate for ICD or EP therapies long term.   Hillis Range, MD 11/13/2011 2:28 PM

## 2011-11-13 NOTE — Progress Notes (Addendum)
Pt had run of SVT with HR 125-130. Pt sleeping in bed with CPAP on. BP 127/75. Pt then breaks SVT and has rhythm of SB rate 39-55 with PAC's and PVC's (pts baseline rhythm). Fellow on call paged. Will monitor.

## 2011-11-13 NOTE — Consult Note (Signed)
WOC consult Note Reason for Consult: eval. Head wound for wound care. Pt with trauma to right posterior scalp secondary to fall.  Would be difficult to keep cover dressing on this area due to location. Would suggest only moist wound healing with petrolatum.  This will keep area moist and soft for reepithelialization and soften the matted area to loosen any scabbing. I have suggested only topical application of Vaseline daily and no dressing due to location.  Pt is agreeable to this.   Wound type:trauma Pressure Ulcer POA:No Measurement:5cm x 5 cm x 0.2cm  Wound bed: large scabbed area with adjacent pink wound bed Drainage (amount, consistency, odor) minimal bloody oozing at times, no odor Periwound:intact, no redness or s/s of infection Dressing procedure/placement/frequency: petrolatum based ointment,applied at least daily.  No cover dressing needed  Re consult if needed, will not follow at this time. Thanks  Yeshaya Vath [employer], CWOCN [phone])

## 2011-11-14 ENCOUNTER — Other Ambulatory Visit: Payer: Self-pay

## 2011-11-14 ENCOUNTER — Encounter (HOSPITAL_COMMUNITY)
Admission: EM | Disposition: A | Discharge status: Skilled Nursing Facility | Payer: Self-pay | Source: Ambulatory Visit | Attending: Internal Medicine

## 2011-11-14 DIAGNOSIS — I498 Other specified cardiac arrhythmias: Secondary | ICD-10-CM

## 2011-11-14 DIAGNOSIS — I2510 Atherosclerotic heart disease of native coronary artery without angina pectoris: Secondary | ICD-10-CM

## 2011-11-14 DIAGNOSIS — S0003XA Contusion of scalp, initial encounter: Secondary | ICD-10-CM

## 2011-11-14 DIAGNOSIS — T887XXA Unspecified adverse effect of drug or medicament, initial encounter: Secondary | ICD-10-CM

## 2011-11-14 DIAGNOSIS — I10 Essential (primary) hypertension: Secondary | ICD-10-CM

## 2011-11-14 HISTORY — PX: PERCUTANEOUS CORONARY STENT INTERVENTION (PCI-S): SHX5485

## 2011-11-14 LAB — POCT ACTIVATED CLOTTING TIME: Activated Clotting Time: 408 seconds

## 2011-11-14 LAB — CBC
HCT: 40.3 % (ref 39.0–52.0)
MCV: 89.6 fL (ref 78.0–100.0)
RBC: 4.5 MIL/uL (ref 4.22–5.81)
RDW: 14.3 % (ref 11.5–15.5)
WBC: 7.7 10*3/uL (ref 4.0–10.5)

## 2011-11-14 LAB — BASIC METABOLIC PANEL
BUN: 19 mg/dL (ref 6–23)
CO2: 25 mEq/L (ref 19–32)
Chloride: 106 mEq/L (ref 96–112)
Creatinine, Ser: 0.86 mg/dL (ref 0.50–1.35)

## 2011-11-14 SURGERY — PERCUTANEOUS CORONARY STENT INTERVENTION (PCI-S)
Anesthesia: LOCAL

## 2011-11-14 MED ORDER — SODIUM CHLORIDE 0.9 % IV SOLN: INTRAVENOUS | Status: AC

## 2011-11-14 MED ORDER — MIDAZOLAM HCL 2 MG/2ML IJ SOLN
INTRAMUSCULAR | Status: AC
  Filled 2011-11-14: qty 2

## 2011-11-14 MED ORDER — ONDANSETRON HCL 4 MG/2ML IJ SOLN: 4.0000 mg | Freq: Four times a day (QID) | INTRAMUSCULAR | Status: DC | PRN

## 2011-11-14 MED ORDER — BIVALIRUDIN 250 MG IV SOLR
INTRAVENOUS | Status: AC
  Filled 2011-11-14: qty 250

## 2011-11-14 MED ORDER — FENTANYL CITRATE 0.05 MG/ML IJ SOLN
INTRAMUSCULAR | Status: AC
  Filled 2011-11-14: qty 2

## 2011-11-14 MED ORDER — FUROSEMIDE 10 MG/ML IJ SOLN
INTRAMUSCULAR | Status: AC
  Filled 2011-11-14: qty 4

## 2011-11-14 MED ORDER — GUAIFENESIN-CODEINE 100-10 MG/5ML PO SOLN
ORAL | Status: AC
  Filled 2011-11-14: qty 5

## 2011-11-14 MED ORDER — CARVEDILOL 6.25 MG PO TABS
6.2500 mg | ORAL_TABLET | Freq: Two times a day (BID) | ORAL | Status: DC
  Administered 2011-11-14 – 2011-11-16 (×4): 6.25 mg via ORAL
  Filled 2011-11-14 (×8): qty 1

## 2011-11-14 MED ORDER — VERAPAMIL HCL 2.5 MG/ML IV SOLN
INTRAVENOUS | Status: AC
  Filled 2011-11-14: qty 2

## 2011-11-14 MED ORDER — HEPARIN (PORCINE) IN NACL 2-0.9 UNIT/ML-% IJ SOLN
INTRAMUSCULAR | Status: AC
  Filled 2011-11-14: qty 1000

## 2011-11-14 MED ORDER — ACETAMINOPHEN 325 MG PO TABS: 650.0000 mg | ORAL_TABLET | ORAL | Status: DC | PRN

## 2011-11-14 MED ORDER — LIDOCAINE HCL (PF) 1 % IJ SOLN
INTRAMUSCULAR | Status: AC
  Filled 2011-11-14: qty 30

## 2011-11-14 MED ORDER — NITROGLYCERIN 0.2 MG/ML ON CALL CATH LAB
INTRAVENOUS | Status: AC
  Filled 2011-11-14: qty 1

## 2011-11-14 MED ORDER — FUROSEMIDE 10 MG/ML IJ SOLN
20.0000 mg | Freq: Once | INTRAMUSCULAR | Status: AC
  Administered 2011-11-14: 20 mg via INTRAVENOUS

## 2011-11-14 NOTE — Progress Notes (Signed)
Pt had episode of SVT 130's lasting approx 2 minutes, RN in room, pt asymptomatic.  Attempted coughing while holding left groin, no change but converted back to 1st degree HB 70's spontaneously.  Later pt had 10 bts Vtach with RN in room, again asymptomatic.  Due for lowered dose Coreg but BP 92/61.  Discussed w/ Ronie Spies PA, advised her Dr Johney Frame already consulted and pt does not want PM or AICD at this time.  PA advised to monitor BP and give Coreg if pt sustains SBP>95 consistently.  Left groin Level 0.  Rt radial bruised, puffy, TR band deflation continues.

## 2011-11-14 NOTE — Progress Notes (Addendum)
Subjective: Overnight, the patient continued to experience several episodes of bradycardia.  Patient's pm coreg was held due to bradycardia.  No acute complaints this am.  Plan for PCI today.  Objective: Vital signs in last 24 hours: Filed Vitals:   11/13/11 2027 11/14/11 0027 11/14/11 0507 11/14/11 0900  BP: 125/53 147/63 164/73 158/89  Pulse: 63 55 64 68  Temp: 98.3 F (36.8 C)  98 F (36.7 C) 98 F (36.7 C)  TempSrc: Oral  Oral Oral  Resp: 15 17 20 20   Height:      Weight:   200 lb 9.9 oz (91 kg)   SpO2: 96% 96% 95% 96%   Weight change: -9 lb 11.2 oz (-4.4 kg)  Intake/Output Summary (Last 24 hours) at 11/14/11 0918 Last data filed at 11/14/11 0500  Gross per 24 hour  Intake    780 ml  Output    450 ml  Net    330 ml   Physical Exam: General: alert, cooperative, and in no apparent distress HEENT: large posterior head hematoma, pupils equal round and reactive to light, vision grossly intact, oropharynx clear and non-erythematous Neck: supple, no lymphadenopathy Lungs: clear to ascultation bilaterally, normal work of respiration, no wheezes, rales, ronchi Heart: regular rate and rhythm, no murmurs, gallops, or rubs Abdomen: soft, non-tender, non-distended, normal bowel sounds  Extremities: no cyanosis, clubbing, or edema Neurologic: alert & oriented X3, cranial nerves II-XII intact, strength grossly intact, sensation intact to light touch  Lab Results: Basic Metabolic Panel:  Lab 11/14/11 1191 11/13/11 0559 11/10/11 0615  NA 139 139 --  K 4.7 4.1 --  CL 106 106 --  CO2 25 25 --  GLUCOSE 101* 95 --  BUN 19 18 --  CREATININE 0.86 0.78 --  CALCIUM 9.4 9.3 --  MG 2.2 -- 2.3  PHOS -- -- --   CBC:  Lab 11/14/11 0410 11/13/11 0559 11/11/11 1035  WBC 7.7 9.1 --  NEUTROABS -- -- 6.9  HGB 13.2 13.6 --  HCT 40.3 41.0 --  MCV 89.6 89.9 --  PLT 279 286 --    2D Echo 11/12/11 Study Conclusions - Left ventricle: The cavity size was normal. Wall thickness was  increased in a pattern of severe LVH. Systolic function was moderately to severely reduced. The estimated ejection fraction was in the range of 30% to 35%. Diffuse hypokinesis. There is akinesis of the posterior myocardium. There is severe hypokinesis of the inferolateral myocardium. - Aortic valve: Valve mobility was restricted. There was severe stenosis. Mild regurgitation. Valve area: 0.7cm^2(VTI). Valve area: 0.63cm^2 (Vmax). - Mitral valve: Calcified annulus. Mild regurgitation. - Left atrium: The atrium was mildly dilated.  Medications: I have reviewed the patient's current medications. Scheduled Meds:    . albuterol  2.5 mg Nebulization Once  . albuterol  2.5 mg Nebulization Once  . amLODipine  5 mg Oral Daily  . aspirin  324 mg Oral Pre-Cath  . aspirin EC  81 mg Oral Daily  . carvedilol  6.25 mg Oral BID  . clopidogrel  75 mg Oral Q breakfast  . diazepam  2 mg Oral On Call  . docusate sodium  100 mg Oral BID  . furosemide  40 mg Oral Daily  . ipratropium  0.5 mg Nebulization Once  . losartan  50 mg Oral Daily  . loteprednol  1 drop Right Eye Daily  . pantoprazole  40 mg Oral Q1200  . simvastatin  20 mg Oral QHS  . sodium chloride  3  mL Intravenous Q12H  . spironolactone  12.5 mg Oral Daily  . Travoprost (BAK Free)  1 drop Right Eye QHS  . white petrolatum      . DISCONTD: carvedilol  12.5 mg Oral BID   Continuous Infusions:    . sodium chloride Stopped (11/12/11 1800)  . sodium chloride 1 mL/kg/hr (11/14/11 0415)   PRN Meds:.sodium chloride, acetaminophen, chlorpheniramine-HYDROcodone, guaiFENesin-dextromethorphan, metoprolol, ondansetron (ZOFRAN) IV, sodium chloride  Assessment/Plan: 78 yo man, history of CAD, CHF, AS, SVT, OSA, presenting with syncope, found to have critical aortic stenosis, s/p balloon valvuloplasty 2/4, plan for PCI today (2/6)  # Aortic stenosis/CAD:  2D echo shows EF 30-35% with regional wall abnormality with critical stenosis of aortic  valve (0.62cm2).  Catheterization on 11/02/11, which revealed stenosis of LAD (95% ostial stenosis) and RCA (90% stenosis).  POD #2 s/p aortic balloon valvuloplasty without complications, though with repeat Echo showing only modest improvement (Vmax = 0.63 cm2).  Plan for PCI today. --Coreg --Continue PT  --greatly appreciate cardiology's management, plan for PCI of LAD tomorrow --continue aspirin, statin  # SVT/sinus brady - patient continues to have episodes of tachy/brady -appreciate cardiology and EP's recs -coreg dose decreased 12.5 -> 6.25 BID -no plan for ICD placement -will further follow-up as outpatient with Dr. Graciela Husbands  # Systolic CHF: -continue lasix   # Essential hypertension: relatively well-controlled during this admission, though recently increasing.  Amlodipine dose decreased from 10 -> 5 earlier this admission out of concern for hypotension -- Continue Lasix 40 PO daily, Amlodipine 5mg  daily, Coreg, and Losartan  -- consider increasing amlodipine back to 10 if BP's still elevated tomorrow (after PCI)    LOS: 14 days   Eugene [name] 11/14/2011, 9:18 AM Internal Medicine Teaching Service Attending Note Date: 11/14/2011  Patient name: Eugene [name]  Medical record number: [id number]  Date of birth: 23-Dec-1923    This patient has been seen and discussed with Dr. Manson Passey. Please see his note for complete details. I concur with his findings and treatment plan for Eugene [name]. We appreciate cardiology consultation and look forward to hearing the results of today's cardiac catheterization procedure. Due to episode of bradycardia overnight, carvedilol will be decreased to 6.25mg  BID  Eugene [name] 11/14/2011, 2:54 PM

## 2011-11-14 NOTE — Progress Notes (Signed)
Coreg held last night due to HR 50's.  Pt has had freq episodes of HR dropping briefly to 37, otherwise 1st degree AV block 40-50's.  Pt alert and asymptomatic.  For PCI today.  Richardean Canal RN from cath lab here, states will update Dr Excell Seltzer this am of HR and episode of Vtach last night.

## 2011-11-14 NOTE — Progress Notes (Signed)
Tele showed 5 bt Vtach.  Pt a&o x 3, asymptomatic and unaware.  BP 115/69.

## 2011-11-14 NOTE — Procedures (Signed)
CARDIAC CATH NOTE  Name: Eugene [name] MRN: [id number] DOB: [birth year]  Procedure: PTCA and stenting of the proximal LAD, Perclose of the left femoral artery.  Indication: This is an 78 year old gentleman with complex cardiac disease. He has ischemic cardiomyopathy, severe aortic stenosis, and severe coronary disease. He underwent balloon aortic valvuloplasty earlier this week and presents today for percutaneous intervention of the proximal LAD.  Procedural Details:  The right wrist was prepped draped and anesthetized with 1% lidocaine. Utilizing the modified Seldinger technique, a 6 French sheath was placed in the right radial artery. Verapamil was administered through the sheath. There was a great deal of tortuosity beginning at the level of the elbow all the way to the aorta. I tried multiple guide catheters but was unable to selectively engage the left main stem. At that point, I shifted access to the left femoral artery. The right femoral artery had been utilized for the patient's oculoplasty procedure. Access was obtained via a front wall puncture. A 6 French sheath was introduced. I still was unable to engage the left main using multiple guide catheters. Ultimately, I changed out to a long 6 French sheath and was able to engage the left mainstem with a JL 4.5 cm guide. Angiomax was then started for anticoagulation. A therapeutic ACT was achieved. The patient had been adequately preloaded with Plavix. Initially, I attempted to wire the LAD with a cougar guidewire. The lesion in the proximal LAD was extremely tortuous and calcified in a wire would not cross. I left a cougar wire and the intermediate branch and used a whisper wire which successfully navigated the proximal LAD stenosis. I was able to advance a 2.5 x 12 mm sprinter legend balloon across the lesion it was inflated to 10 atmospheres. I then attempted a 3.0 x 12 mm sprinter but this would not cross. Noncompliant balloons would also not  cross. At that point I rewired the LAD with a cougar guidewire for better support. I was eventually able to cross the lesion with a 3.0 x 12 mm sprinter balloon and it was dilated to 12 atmospheres with good expansion. The vessel was then stented with an integrity bare-metal stent. A 3.0 x 15 mm stent platform was utilized and the stent was deployed at 14 atmospheres with good expansion. The stent was postdilated with a 3.5 x 12 mm [place] sprinter balloon. There was an excellent angiographic result the completion of the procedure was 0% residual stenosis and TIMI-3 flow. The left femoral artery was closed with a Perclose device.   Conclusions:  successful complex PCI of severe stenosis in the proximal LAD utilizing a bare-metal stent platform.   Recommendations: the patient should remain on dual antiplatelet therapy with aspirin and Plavix for a minimum of 30 days.   Tonny Bollman 11/14/2011, 6:32 PM

## 2011-11-14 NOTE — Progress Notes (Signed)
Patient Name: Eugene [name] Date of Encounter: 11/14/2011  Principal Problem:  *Syncope and collapse Active Problems:  HYPERLIPIDEMIA  Essential hypertension, benign  Congestive heart failure  SVT (supraventricular tachycardia)  CAD (coronary artery disease)  Aortic stenosis    SUBJECTIVE:Feels well, denies CP/SOB, dry cough at times. Ready for PCI.  OBJECTIVE Filed Vitals:   11/13/11 1307 11/13/11 2027 11/14/11 0027 11/14/11 0507  BP:  125/53 147/63 164/73  Pulse: 63 63 55 64  Temp: 98.3 F (36.8 C) 98.3 F (36.8 C)  98 F (36.7 C)  TempSrc: Oral Oral  Oral  Resp: 18 15 17 20   Height:      Weight:    200 lb 9.9 oz (91 kg)  SpO2: 95% 96% 96% 95%    Intake/Output Summary (Last 24 hours) at 11/14/11 0717 Last data filed at 11/14/11 0500  Gross per 24 hour  Intake    780 ml  Output    750 ml  Net     30 ml   Weight change: -9 lb 11.2 oz (-4.4 kg) Wt Readings from Last  Encounter:  11/14/11 200 lb 9.9 oz (91 kg)     PHYSICAL EXAM  General: Well developed, well nourished, elderly white male, in no acute distress. HOH Head: Normocephalic, s/p occipital injury.  Neck: Supple without bruits, JVD at 8cm. Lungs:  Resp regular and unlabored, rales bilateral bases. Heart: RRR, S1, S2, no S3, S4, 2/6 murmur. Abdomen: Soft, non-tender, non-distended, BS + x 4.  Extremities: No clubbing, cyanosis, trace edema.  Neuro: Alert and oriented X 3. Moves all extremities spontaneously. Psych: Normal affect.  LABS:  CBC: Basename 11/14/11 0410 11/13/11 0559 11/11/11 1035  WBC 7.7 9.1 --  NEUTROABS -- -- 6.9  HGB 13.2 13.6 --  HCT 40.3 41.0 --  MCV 89.6 89.9 --  PLT 279 286 --   INR: Basename 11/12/11 0550  INR 1.02   Basic Metabolic Panel: Basename 11/14/11 0410 11/13/11 0559  NA 139 139  K 4.7 4.1  CL 106 106  CO2 25 25  GLUCOSE 101* 95  BUN 19 18  CREATININE 0.86 0.78  CALCIUM 9.4 9.3  MG 2.2 --  PHOS -- --   TELE: SR, S brady sustained in the 40s,  especially during sleep and frequently into the 30s, tachycardia - brief -see strips.       Current Medications:    . albuterol  2.5 mg Nebulization Once  . albuterol  2.5 mg Nebulization Once  . amLODipine  5 mg Oral Daily  . aspirin  324 mg Oral Pre-Cath  . aspirin EC  81 mg Oral Daily  . carvedilol  12.5 mg Oral BID  . clopidogrel  75 mg Oral Q breakfast  . diazepam  2 mg Oral On Call  . docusate sodium  100 mg Oral BID  . furosemide  40 mg Oral Daily  . ipratropium  0.5 mg Nebulization Once  . losartan  50 mg Oral Daily  . loteprednol  1 drop Right Eye Daily  . pantoprazole  40 mg Oral Q1200  . simvastatin  20 mg Oral QHS  . sodium chloride  3 mL Intravenous Q12H  . spironolactone  12.5 mg Oral Daily  . Travoprost (BAK Free)  1 drop Right Eye QHS  . white petrolatum        ASSESSMENT AND PLAN: Principal Problem:  *Syncope and collapse - see JA note - no EPS/PPM/ablation planned at this time - he is  asymptomatic. Active Problems: *Essential hypertension, benign - cont to follow. May need to increase Cozaar/Norvasc for better BP control.  * Congestive heart failure - volume status OK at this time. Decrease pre-procedure IVF to 50cc/hr  *SVT (supraventricular tachycardia) - only 1 episode overnight, BB held because of bradycardia  *CAD (coronary artery disease) - for PCI this pm.  *Aortic stenosis - S/P Valvuloplasty - doing well  *Bradycardia/tachycardia - Coreg held last pm, will decrease dose with parameters.  Signed, Theodore Demark , PA-C 7:17 AM 11/14/2011 Patient seen, examined. Available data reviewed. Agree with findings, assessment, and plan as outlined by Theodore Demark, PA-C. Plans for PCI noted.  Risks and indications reviewed with patient who understands and agrees to proceed.  Tonny Bollman, M.D. 11/14/2011 4:15 PM

## 2011-11-14 NOTE — Progress Notes (Signed)
Patient discussed at the Long Length of Stay Eugene [name] Weeks 11/14/2011  

## 2011-11-14 NOTE — Progress Notes (Signed)
Dr. Excell Seltzer notified of HR 37-50, completely asymptomatic.  Theodore Demark PA here, also notified.

## 2011-11-15 ENCOUNTER — Other Ambulatory Visit: Payer: Self-pay

## 2011-11-15 LAB — BASIC METABOLIC PANEL
Chloride: 104 mEq/L (ref 96–112)
GFR calc Af Amer: 90 mL/min — ABNORMAL LOW (ref >90)
Potassium: 4 mEq/L (ref 3.5–5.1)
Sodium: 137 mEq/L (ref 135–145)

## 2011-11-15 LAB — CBC
Platelets: 273 10*3/uL (ref 150–400)
RDW: 14.4 % (ref 11.5–15.5)
WBC: 10.1 10*3/uL (ref 4.0–10.5)

## 2011-11-15 MED ORDER — AMIODARONE HCL 200 MG PO TABS
400.0000 mg | ORAL_TABLET | Freq: Two times a day (BID) | ORAL | Status: DC
  Administered 2011-11-15 – 2011-11-16 (×3): 400 mg via ORAL
  Filled 2011-11-15 (×4): qty 2

## 2011-11-15 NOTE — Progress Notes (Deleted)
Patient vommitted approx. 150 cc semi solid emesis; given zofran IV; no signs or symptoms of aspiration.

## 2011-11-15 NOTE — Progress Notes (Addendum)
Subjective: Overnight, the patient continued to have episodes of tachycardia/bradycardia.  The patient underwent PCI yesterday, with no complications.  This morning, the patient had an episode of tachycardia to the 140's, associated with significant lightheadedness and hypotension, which spontaneously resolved after moving from a sitting to a trendelenburg position.  Objective: Vital signs in last 24 hours: Filed Vitals:   11/14/11 2333 11/15/11 0200 11/15/11 0359 11/15/11 0732  BP: 139/79 117/66 141/74   Pulse: 63 54 70   Temp: 97.6 F (36.4 C)  97.6 F (36.4 C)   TempSrc: Oral  Oral   Resp: 17 15 20    Height:      Weight:    203 lb 7.8 oz (92.3 kg)  SpO2: 97% 96% 95%    Weight change:   Intake/Output Summary (Last 24 hours) at 11/15/11 0755 Last data filed at 11/15/11 0207  Gross per 24 hour  Intake    600 ml  Output   1875 ml  Net  -1275 ml   Physical Exam: General: alert, cooperative, appears uncomfortable HEENT: large posterior head hematoma, pupils equal round and reactive to light, vision grossly intact, oropharynx clear and non-erythematous Neck: supple, no lymphadenopathy Lungs: clear to ascultation bilaterally, normal work of respiration, no wheezes, rales, ronchi Heart: regular rate and rhythm, no murmurs, gallops, or rubs Abdomen: soft, non-tender, non-distended, normal bowel sounds  Extremities: no cyanosis, clubbing, or edema Neurologic: alert & oriented X3, cranial nerves II-XII intact, strength grossly intact, sensation intact to light touch  Lab Results: Basic Metabolic Panel:  Lab 11/15/11 5621 11/14/11 0410 11/10/11 0615  NA 137 139 --  K 4.0 4.7 --  CL 104 106 --  CO2 24 25 --  GLUCOSE 105* 101* --  BUN 18 19 --  CREATININE 0.84 0.86 --  CALCIUM 9.6 9.4 --  MG -- 2.2 2.3  PHOS -- -- --   CBC:  Lab 11/15/11 0529 11/14/11 0410 11/11/11 1035  WBC 10.1 7.7 --  NEUTROABS -- -- 6.9  HGB 13.4 13.2 --  HCT 39.2 40.3 --  MCV 88.7 89.6 --  PLT 273  279 --    2D Echo 11/12/11 Study Conclusions - Left ventricle: The cavity size was normal. Wall thickness was increased in a pattern of severe LVH. Systolic function was moderately to severely reduced. The estimated ejection fraction was in the range of 30% to 35%. Diffuse hypokinesis. There is akinesis of the posterior myocardium. There is severe hypokinesis of the inferolateral myocardium. - Aortic valve: Valve mobility was restricted. There was severe stenosis. Mild regurgitation. Valve area: 0.7cm^2(VTI). Valve area: 0.63cm^2 (Vmax). - Mitral valve: Calcified annulus. Mild regurgitation. - Left atrium: The atrium was mildly dilated.  Medications: I have reviewed the patient's current medications. Scheduled Meds:    . albuterol  2.5 mg Nebulization Once  . albuterol  2.5 mg Nebulization Once  . amLODipine  5 mg Oral Daily  . aspirin  324 mg Oral Pre-Cath  . aspirin EC  81 mg Oral Daily  . bivalirudin      . bivalirudin      . carvedilol  6.25 mg Oral BID  . clopidogrel  75 mg Oral Q breakfast  . diazepam  2 mg Oral On Call  . docusate sodium  100 mg Oral BID  . fentaNYL      . furosemide  20 mg Intravenous Once  . furosemide  40 mg Oral Daily  . heparin      . ipratropium  0.5 mg  Nebulization Once  . lidocaine      . losartan  50 mg Oral Daily  . loteprednol  1 drop Right Eye Daily  . midazolam      . nitroGLYCERIN      . pantoprazole  40 mg Oral Q1200  . simvastatin  20 mg Oral QHS  . spironolactone  12.5 mg Oral Daily  . Travoprost (BAK Free)  1 drop Right Eye QHS  . verapamil      . DISCONTD: sodium chloride  3 mL Intravenous Q12H   Continuous Infusions:    . sodium chloride Stopped (11/12/11 1800)  . sodium chloride 50 mL/hr at 11/14/11 1901  . DISCONTD: sodium chloride 1 mL/kg/hr (11/14/11 0415)   PRN Meds:.acetaminophen, chlorpheniramine-HYDROcodone, guaiFENesin-dextromethorphan, metoprolol, ondansetron (ZOFRAN) IV, DISCONTD: sodium chloride, DISCONTD:  acetaminophen, DISCONTD: ondansetron (ZOFRAN) IV, DISCONTD: sodium chloride  Assessment/Plan: 78 yo man, history of CAD, CHF, AS, SVT, OSA, presenting with syncope, found to have critical aortic stenosis, s/p balloon valvuloplasty 2/4, s/p PCI 2/6.  # Aortic stenosis/CAD:  Patient found to have critical stenosis of aortic valve (0.62cm2), now POD #3 s/p aortic balloon valvuloplasy. Catheterization on 11/02/11, which revealed stenosis of LAD (95% ostial stenosis) and RCA (90% stenosis), now POD #1 s/p BMS placement to LAD. --Coreg --Continue PT  --greatly appreciate cardiology's management --continue aspirin, statin, plavix  # SVT/sinus brady - patient continues to have episodes of tachy/brady, with a 1-hr episode of tachycardia this AM associated with lightheadedness.  The patient has been seen previously be EP, and had declined pacemaker or ICD. -appreciate cardiology and EP's recs -continue coreg 6.25 BID, consider additional IV BB as needed for tachycardia -will further follow-up as outpatient with Dr. Graciela Husbands  # Systolic CHF: -continue lasix   # Essential hypertension: relatively well-controlled during this admission, though recently increasing.  Amlodipine dose decreased from 10 -> 5 earlier this admission out of concern for hypotension -- Continue Lasix 40 PO daily, Amlodipine 5mg  daily, Coreg, and Losartan -- adjust coreg and amlodipine as needed to control HR and BP  # Prophy - SCD's    LOS: 15 days   [name] 11/15/2011, 7:55 AM Internal Medicine Teaching Service Attending Note Date: 11/15/2011  Patient name: Eugene [name]  Medical record number: [id number]  Date of birth: [birth year]    This patient has been seen and discussed with Dr. Manson Passey. Please see his note for complete details. I concur with his findings . Eugene [name] is PPD#1 from left heart cath and BMS placement x 2 to LAD with successful opening of coronaries. Post procedure he had 10bt of NSVT that was  asymptomatic; however this morning during rounds, the patient had an hour long episode of afib with RVR, HR 140s and sBP 70s for which he was symptomatic. His arrhythmia returned to normal sinus rhythm when he was transferred to bed and placed in trendelenburg positioning and repeat BP reading showed sBP in 130s. His feelings of lightheadedness improved. Per cardiology, Dr. Earmon Phoenix recommendations, the patient will remain on tele and stepdown unit for evaluation today.   Duke Salvia Drue Second MD MPH [facility] [phone] 11/15/2011, 11:59 AM

## 2011-11-15 NOTE — Progress Notes (Signed)
Patient vommitted approx.150 cc semi solid emesis.  Given Zofran IV.  No signs or symptoms of aspiration.

## 2011-11-15 NOTE — Progress Notes (Signed)
Coreg given when bp increased.  Tele has been 1st degree AV block w/ rate 60-70's when awake, mostly 50's when asleep w/ much rarer drop to high 30's than last night.  Noted to increase to 90's w/ attempts to get OOB.  Pt more confused and forgetful tonight than last night.

## 2011-11-15 NOTE — Progress Notes (Signed)
CARDIAC REHAB PHASE I   PRE:  Rate/Rhythm: 75 SR    BP: sitting 106/41    SaO2:   MODE:  Ambulation: 100 ft   POST:  Rate/Rhythm: 100 ST    BP: sitting 123/65     SaO2:   Pt weak with forward lean over RW. VCs for safety. Sts he is not tired but ready to go into room. Sts he lost his hearing aid. Long time looking for it without success. Reminded pt how to take NTG. [id number]  Harriet Masson CES, ACSM

## 2011-11-15 NOTE — Progress Notes (Signed)
Physical Therapy Treatment Patient Details Name: Eugene [name] MRN: [id number] DOB: 1924-08-05 Today's Date: 11/15/2011  PT Assessment/Plan  PT - Assessment/Plan Comments on Treatment Session: Did great today. Progressing well.  Likely d/c to [facility] tomorrow per chart.  PT Plan: Discharge plan remains appropriate;Frequency remains appropriate Follow Up Recommendations: Skilled nursing facility;Supervision/Assistance - 24 hour Equipment Recommended: Defer to next venue PT Goals  Acute Rehab PT Goals PT Goal: Supine/Side to Sit - Progress: Progressing toward goal PT Goal: Sit to Stand - Progress: Progressing toward goal PT Goal: Stand to Sit - Progress: Progressing toward goal PT Goal: Ambulate - Progress: Progressing toward goal  PT Treatment Precautions/Restrictions  Precautions Precautions: Fall Required Braces or Orthoses: No Restrictions Weight Bearing Restrictions: No Mobility (including Balance) Bed Mobility Supine to Sit: 5: Supervision Supine to Sit Details (indicate cue type and reason): pt sat up prior to taking the bed rail down; pt needing cueing for problem solving with regards to this to identify the issue with bed rail being in his way Sitting - Scoot to Edge of Bed: 4: Min assist Sitting - Scoot to [place] of Bed Details (indicate cue type and reason): cues to scoot EOB prior to sit->stand Transfers Sit to Stand: 4: Min assist;From elevated surface Sit to Stand Details (indicate cue type and reason): 3 attempts to stand unsuccessful until surface elevated; cues to slow down in prep for stand and assist for follow through with faciliation for anterior translation at sacrum Stand to Sit: 4: Min assist Stand to Sit Details: minA to guide hips to chair; cues to scoot hips posteriorly for more upright sitting mingaurdA Ambulation/Gait Ambulation/Gait Assistance: 4: Min assist Ambulation/Gait Assistance Details (indicate cue type and reason): amb. approx 200 ft with  RW; verbal cues for forward gaze and upright posture; minA to steady self during turns; pt appears to have a slight trendelenberg gait with lateral weakness on left; pt with no c/o pain in either hip Ambulation Distance (Feet): 200 Feet Assistive device: Rolling walker Gait Pattern: Trendelenburg;Trunk flexed  Posture/Postural Control Posture/Postural Control: No significant limitations Static Standing Balance Static Standing - Balance Support: Bilateral upper extremity supported Static Standing - Level of Assistance: 5: Stand by assistance Exercise   Standing Hip Extension: AROM;Both;10 reps;Standing (5 reps x2) Hip ABduction/ADduction: AROM;Both;10 reps;Standing (5 reps x2) End of Session PT - End of Session Equipment Utilized During Treatment: Gait belt Activity Tolerance: Patient tolerated treatment well Patient left: in chair Nurse Communication: Mobility status for transfers;Mobility status for ambulation General Behavior During Session: [facility] for tasks performed Cognition: Impaired Cognitive Impairment: slightly impulsive today needing minimal cues for safety; good awareness of need for RW however as he reports he is unsteady  WHITLOW,Mahreen Schewe HELEN 11/15/2011, 9:04 AM

## 2011-11-15 NOTE — Progress Notes (Signed)
Subjective:    Objective:  Vital Signs in the last 24 hours: Temp:  [97.4 F (36.3 C)-98 F (36.7 C)] 97.6 F (36.4 C) (02/07 0359) Pulse Rate:  [53-138] 70  (02/07 0359) Resp:  [15-26] 20  (02/07 0359) BP: (97-166)/(64-91) 141/74 mmHg (02/07 0359) SpO2:  [88 %-99 %] 95 % (02/07 0359)  Intake/Output from previous day: 02/06 0701 - 02/07 0700 In: 600 [P.O.:600] Out: 1875 [Urine:1875]  Physical Exam: Pt is alert and oriented, elderly male in NAD HEENT: normal Neck: JVP - normal Lungs: CTA bilaterally CV: RRR with 2/6 systolic murmur at the LSB Abd: soft, NT, Positive BS, no hepatomegaly Ext: no C/C/E. Bilateral groin sites and right radial sites are clear Skin: warm/dry no rash   Lab Results:  Basename 11/15/11 0529 11/14/11 0410  WBC 10.1 7.7  HGB 13.4 13.2  PLT 273 279    Basename 11/15/11 0529 11/14/11 0410  NA 137 139  K 4.0 4.7  CL 104 106  CO2 24 25  GLUCOSE 105* 101*  BUN 18 19  CREATININE 0.84 0.86   No results found for this basename: TROPONINI:2,CK,MB:2 in the last 72 hours  Tele: sinus rhythm with run of SVT and short run of nonsustained VT  Assessment/Plan:  1. Syncope and collapse - etiology unclear but patient with structural and coronary heart disease as well as arrhythmia. 2. Acute on chronic systolic heart failure, improved 3. Severe AS s/p BAV 4. Severe CAD s/p PCI of the LAD with a bare metal stent 5. Atrial tach/SVT - change beta-blocker to coreg 6.25 mg bid. Will tolerate associated episodes of bradycardia as he does not seem to be symptomatic 6 NSVT - beta-blocker as above  Rec: Continue current therapy. Work with PT, mobilize. Dispo - [facility] when bed available. Probably would benefit from at least one more day here for observation of his rhythm and gait stability.  Tonny Bollman, M.D. 11/15/2011, 7:13 AM

## 2011-11-15 NOTE — Progress Notes (Signed)
11/13/11 1700 Pt. had valvuloplasty with Dr. Excell Seltzer.  Will have PCI in am of LAD.  Pt. will discharge to [facility] when medically stable.    UR Completed. Tera Mater, RN, BSN

## 2011-11-16 DIAGNOSIS — R55 Syncope and collapse: Secondary | ICD-10-CM

## 2011-11-16 DIAGNOSIS — I5023 Acute on chronic systolic (congestive) heart failure: Secondary | ICD-10-CM

## 2011-11-16 LAB — CBC
HCT: 37.5 % — ABNORMAL LOW (ref 39.0–52.0)
Hemoglobin: 12.4 g/dL — ABNORMAL LOW (ref 13.0–17.0)
MCV: 89.5 fL (ref 78.0–100.0)
RDW: 14.6 % (ref 11.5–15.5)
WBC: 10.8 10*3/uL — ABNORMAL HIGH (ref 4.0–10.5)

## 2011-11-16 LAB — BASIC METABOLIC PANEL
BUN: 26 mg/dL — ABNORMAL HIGH (ref 6–23)
CO2: 24 mEq/L (ref 19–32)
Chloride: 102 mEq/L (ref 96–112)
Creatinine, Ser: 1 mg/dL (ref 0.50–1.35)
GFR calc Af Amer: 77 mL/min — ABNORMAL LOW (ref >90)
Glucose, Bld: 111 mg/dL — ABNORMAL HIGH (ref 70–99)
Potassium: 4.1 mEq/L (ref 3.5–5.1)

## 2011-11-16 MED ORDER — CLOPIDOGREL BISULFATE 75 MG PO TABS: 75.0000 mg | ORAL_TABLET | Freq: Every day | ORAL | Status: DC

## 2011-11-16 MED ORDER — AMIODARONE HCL 200 MG PO TABS: 200.0000 mg | ORAL_TABLET | Freq: Every day | ORAL | Status: DC

## 2011-11-16 MED ORDER — AMIODARONE HCL 400 MG PO TABS: 400.0000 mg | ORAL_TABLET | Freq: Two times a day (BID) | ORAL | Status: DC

## 2011-11-16 MED ORDER — CARVEDILOL 6.25 MG PO TABS: 6.2500 mg | ORAL_TABLET | Freq: Two times a day (BID) | ORAL | Status: DC

## 2011-11-16 MED FILL — Dextrose Inj 5%: INTRAVENOUS | Qty: 50 | Status: AC

## 2011-11-16 NOTE — Discharge Summary (Signed)
Internal Medicine Teaching Service Attending Note Date: 11/16/2011  Patient name: Eugene [name]  Medical record number: [id number]  Date of birth: 08-20-1924    This patient has been seen and discussed with Dr. Manson Passey. Please see his discharge summary for complete details on his hospitalization. I concur with his summary, assessment, and discharge plan for SNF for Eugene [name].  Judyann Munson 11/16/2011, 3:17 PM

## 2011-11-16 NOTE — Discharge Summary (Signed)
Internal Medicine Teaching [facility] Discharge Note  Name: Eugene [name] MRN: [id number] DOB: Apr 17, 1924 78 y.o.  Date of Admission: 10/31/2011  3:14 PM Date of Discharge: 11/16/2011 Attending Physician: Judyann Munson, MD  Discharge Diagnosis: Principal Problem:  *Syncope and collapse Active Problems:  HYPERLIPIDEMIA  Essential hypertension, benign  Systolic Congestive heart failure - TTE 11/02/11 - EF 30-35%  SVT (supraventricular tachycardia)  CAD (coronary artery disease) - s/p cath 11/02/11 - RCA 90% stenosis, LAD (ostial) 95% stenosis, s/p BMS x2 to LAD 11/14/11  Critical Aortic stenosis - s/p aortic balloon valvuloplasty  Discharge Medications: Medication List  As of 11/16/2011  1:30 PM   STOP taking these medications         furosemide 40 MG tablet         TAKE these medications         amiodarone 400 MG tablet   Commonly known as: PACERONE   Take 1 tablet (400 mg total) by mouth 2 (two) times daily.      amiodarone 200 MG tablet   Commonly known as: PACERONE   Take 1 tablet (200 mg total) by mouth daily.   Start taking on: 12/01/2011      amLODipine 5 MG tablet   Commonly known as: NORVASC   Take 1 tablet (5 mg total) by mouth daily.      aspirin 81 MG tablet   Take 81 mg by mouth daily.      carvedilol 6.25 MG tablet   Commonly known as: COREG   Take 1 tablet (6.25 mg total) by mouth 2 (two) times daily.      clopidogrel 75 MG tablet   Commonly known as: PLAVIX   Take 1 tablet (75 mg total) by mouth daily with breakfast.      docusate sodium 100 MG capsule   Commonly known as: COLACE   Take 100 mg by mouth 2 (two) times daily.      furosemide 20 MG tablet   Commonly known as: LASIX   Take 2 tablets (40 mg total) by mouth daily.      losartan 50 MG tablet   Commonly known as: COZAAR   Take 50 mg by mouth daily.      loteprednol 0.5 % ophthalmic suspension   Commonly known as: LOTEMAX   Place 1 drop into the right eye 4 (four) times daily.        pantoprazole 40 MG tablet   Commonly known as: PROTONIX   Take 1 tablet (40 mg total) by mouth daily at 12 noon.      simvastatin 40 MG tablet   Commonly known as: ZOCOR   Take 40 mg by mouth at bedtime.      spironolactone 12.5 mg Tabs   Commonly known as: ALDACTONE   Take 0.5 tablets (12.5 mg total) by mouth daily.      travoprost (benzalkonium) 0.004 % ophthalmic solution   Commonly known as: TRAVATAN   Place 1 drop into the right eye at bedtime.           Disposition and follow-up:   Mr.Damontay N Word was discharged from [facility] to SNF in Fair condition.  He will follow-up with Dr. Graciela Husbands on 12/11/11, with Dr. Excell Seltzer on 11/27/11, and with Dr. Baltazar Apo on 12/07/11.  At hospital follow up, he will need to have his renal function monitored, and he will need to be evaluated for further episodes of syncope and further symptoms of his critical  aortic stenosis.   Follow-up Appointments: Follow-up Information    Follow up with Sherryl Manges, MD on 12/11/2011. (11 am)    Contact information:   [address], [address] [place] [address] [phone]       Follow up with Melida Quitter, MD on 12/07/2011. (10:15am)    Contact information:   [address] [place] [address] [phone]       Follow up with Tonny Bollman, MD on 11/27/2011. (10:00 am)    Contact information:   [address], [address] [place] [address] [phone]         Discharge Orders    Future Appointments: Provider: Department: Dept Phone: Center:   12/07/2011 10:15 AM Melida Quitter, MD [facility] [phone] [facility]     Consultations: Cardiology, CT surgery   Procedures Performed:  PTCA and stenting of proximal LAD 11/14/11 - successful complex PCI of severe stenosis in the proximal LAD using a bare-metal stent platform.  Balloon aortic valvuloplasty 11/12/11 - successful  balloon aortic valvuloplasty  Dg Chest 2 View 11/04/2011  Clinical Data: Cough, short of breath  Comparison: Chest radiograph 10/31/2011  Findings: Stable enlarged cardiac silhouette.  There is a left lower lobe atelectasis.  There is central venous pulmonary congestion.  No evidence of focal consolidation.  No pneumothorax.  IMPRESSION:  1.  Cardiomegaly and  left lower lobe atelectasis is similar prior. 2.  Central venous pulmonary congestion is similar to prior.  Original Report Authenticated By: Genevive Bi, M.D.   Ct Head Wo Contrast 10/31/2011  Clinical Data: Fall going up steps.  Amnestic of the incident. Abrasion between eyebrows.  The large hematoma in the right parietal scalp.  CT HEAD WITHOUT CONTRAST  Technique:  Contiguous axial images were obtained from the base of the skull through the vertex without contrast.  Comparison: CT head without contrast 09/12/2009.  Findings: A large right parietal scalp hematoma is present. Although there is some motion in this area, there is no underlying fracture. Minimal soft tissue swelling is noted of the glabella. There is no underlying fracture.  No other significant extracranial injury is evident.  The study is mildly degraded by motion.  Atrophy and moderate white matter change is stable.  No acute cortical infarct, hemorrhage, or mass lesion is present.  Dolichoectasia of the basilar artery is stable.  The ventricles are proportionate to the degree of atrophy. No significant extra-axial fluid collection is present.  The paranasal sinuses and mastoid air cells are clear.  A metallic scleral band of the lateral aspect of the right globe is stable.  IMPRESSION:  1.  The large right parietal scalp hematoma without underlying fracture. 2.  Minimal soft tissue swelling above the glabella without underlying fracture. 3.  Stable atrophy and white matter disease. 4.  No acute intracranial abnormality. 5.  Atherosclerosis.  Original Report Authenticated By:  Jamesetta Orleans. MATTERN, M.D.   2D Echo: 11/02/11 - Left ventricle: The cavity size was severely dilated. Wall thickness was increased in a pattern of mild LVH. Systolic function was moderately to severely reduced. The estimated ejection fraction was in the range of 30% to 35%. Doppler parameters are consistent with elevated ventricular end-diastolic filling pressure. - Regional wall motion abnormality: Akinesis and scarring of the basal-mid inferolateral myocardium; akinesis of the basal-mid anterolateral and apical lateral myocardium; hypokinesis of the basal-mid inferoseptal, entire inferior, and apical septal myocardium; mild hypokinesis of the  entire anterior, basal-mid anteroseptal, and apical myocardium. - Aortic valve: There was critical stenosis. Trivial regurgitation. Peak velocity: 410cm/s (S). Valve area: 0.6cm^2(VTI). Valve area: 0.62cm^2 (Vmax). - Mitral valve: Calcified annulus. Mild regurgitation. - Left atrium: The atrium was severely dilated.  2D Echo: 11/12/11 - Left ventricle: The cavity size was normal. Wall thickness was increased in a pattern of severe LVH. Systolic function was moderately to severely reduced. The estimated ejection fraction was in the range of 30% to 35%. Diffuse hypokinesis. There is akinesis of the posterior myocardium. There is severe hypokinesis of the inferolateral myocardium. - Aortic valve: Valve mobility was restricted. There was severe stenosis. Mild regurgitation. Valve area: 0.7cm^2(VTI). Valve area: 0.63cm^2 (Vmax). - Mitral valve: Calcified annulus. Mild regurgitation. - Left atrium: The atrium was mildly dilated.  Cardiac Cath: 11/02/11 Coronary dominance: Right  Left mainstem: Luminal irregularities  Left anterior descending (LAD): Ostial 95% stenosis. There is an aneurysmal dilatation immediately following this. The LAD otherwise has diffuse nonobstructive plaque. The LAD does feed collaterals to an occluded large posterior  lateral. Mid diagonal is small to moderate sized and free of high-grade disease.  Ramus intermedius: Very large vessel with mild luminal irregularities.  Left circumflex (LCx): Circumflex in the AV groove happens proximal luminal irregularities. There is a branching mid obtuse marginal. There appears to be a proximal 70% stenosis prior to bifurcation. The remainder of the vessel is free of high-grade disease although there is a superior branch 30-40% stenosis.  Right coronary artery (RCA): Severe diffuse disease. There is proximal 75 followed by 90% stenosis. There is diffuse mid heavy calcification with tandem focal 50% lesions. There is a long 90% stenosis before a small PDA. The vessel is then occluded before the large posterior lateral with collaterals as described.  Left ventriculography: I was unable to cross the aortic valve.  Final Conclusions: Severe coronary disease as described below. Echo results from this admission are pending. He is known to have a mild to moderately reduced ejection fraction by his previous echo with at least moderate aortic stenosis.  Carotid Doppler: 11/06/11 - Technically difficult study. - No significant extracranial carotid artery stenosis demonstrated. Vertebrals are patent with antegrade flow.  PFT: 11/06/11, results to be scanned in  Admission HPI:  DILLON LIVERMORE is an 78 y.o. male with multiple medical problems including known coronary disease, congestive heart failure on diastolic basis, mild aortic stenosis, supraventricular tachycardia, obstructive sleep apnea on CPAP brought into the emergency room as he had loss of consciousness. He did not recall the event. There has been no reported seizure activity, loss of bowel or bladder function, or post ictal confusion. He denied any chest pain or shortness of breath. He has no fever, chills, abdominal cramps or pain, black stool or bloody stool. Evaluation in the emergency room included an EKG which shows SVT,  negative cardiac markers, normal renal functions, and chest x-ray showed vascular congestion with elevated BNP of 1400. In the emergency room he has persistent tachycardia with a heart rate of 140, blood pressure of 110, and is rather asymptomatic. Hospitalist was asked to admit patient for further evaluation and treatment.  Admission Physical Exam:  Filed Vitals:    10/31/11 1558  10/31/11 1719  10/31/11 1859  10/31/11 1931   BP:   108/85  113/83    Pulse:   146  142    Temp:       TempSrc:       Resp:    25  SpO2:  90%  86%  90%  89%    Blood pressure 113/83, pulse 142, temperature 98.7 F (37.1 C), temperature source Oral, resp. rate 25, SpO2 89.00%.  GEN: Pleasant person lying in the stretcher in no acute distress; cooperative with exam  PSYCH: alert and oriented x4; does not appear anxious does not appear depressed; affect is normal  HEENT: Mucous membranes pink and anicteric; PERRLA; EOM intact; no cervical lymphadenopathy nor thyromegaly or carotid bruit; no JVD; there is a small hematoma on his forehead  Breasts:: Not examined  CHEST WALL: No tenderness  CHEST: Normal respiration, clear to auscultation bilaterally  HEART: Regular rate tachycardic; no murmurs rubs or gallops  BACK: No kyphosis or scoliosis; no CVA tenderness  ABDOMEN: Obese, soft non-tender; no masses, no organomegaly, normal abdominal bowel sounds; no pannus; no intertriginous candida.  Rectal Exam: Not done  EXTREMITIES: No bone or joint deformity; age-appropriate arthropathy of the hands and knees; there is 2+ edema bilaterally; no ulcerations.  Genitalia: not examined  PULSES: 2+ and symmetric  SKIN: Normal hydration no rash or ulceration  CNS: Cranial nerves 2-12 grossly intact no focal neurologic deficit   Admission Labs & Imaging  Results for orders placed during the hospital encounter of 10/31/11 (from the past 48 hour(s))   CARDIAC PANEL(CRET KIN+CKTOT+MB+TROPI) Status: Abnormal    Collection Time      10/31/11 3:32 PM   Component  Value  Range  Comment    Total CK  102  7 - 232 (U/L)     CK, MB  4.1 (*)  0.3 - 4.0 (ng/mL)     Troponin I  <0.30  <0.30 (ng/mL)     Relative Index  4.0 (*)  0.0 - 2.5    CBC Status: Abnormal    Collection Time    10/31/11 3:49 PM   Component  Value  Range  Comment    WBC  11.8 (*)  4.0 - 10.5 (K/uL)     RBC  5.47  4.22 - 5.81 (MIL/uL)     Hemoglobin  16.7  13.0 - 17.0 (g/dL)     HCT  44.0  10.2 - 52.0 (%)     MCV  87.9  78.0 - 100.0 (fL)     MCH  30.5  26.0 - 34.0 (pg)     MCHC  34.7  30.0 - 36.0 (g/dL)     RDW  72.5  36.6 - 15.5 (%)     Platelets  177  150 - 400 (K/uL)    DIFFERENTIAL Status: Abnormal    Collection Time    10/31/11 3:49 PM   Component  Value  Range  Comment    Neutrophils Relative  78 (*)  43 - 77 (%)     Neutro Abs  9.3 (*)  1.7 - 7.7 (K/uL)     Lymphocytes Relative  10 (*)  12 - 46 (%)     Lymphs Abs  1.2  0.7 - 4.0 (K/uL)     Monocytes Relative  11  3 - 12 (%)     Monocytes Absolute  1.3 (*)  0.1 - 1.0 (K/uL)     Eosinophils Relative  1  0 - 5 (%)     Eosinophils Absolute  0.1  0.0 - 0.7 (K/uL)     Basophils Relative  0  0 - 1 (%)     Basophils Absolute  0.0  0.0 - 0.1 (K/uL)    COMPREHENSIVE METABOLIC PANEL Status: Abnormal  Collection Time    10/31/11 3:49 PM   Component  Value  Range  Comment    Sodium  137  135 - 145 (mEq/L)     Potassium  3.6  3.5 - 5.1 (mEq/L)     Chloride  100  96 - 112 (mEq/L)     CO2  24  19 - 32 (mEq/L)     Glucose, Bld  117 (*)  70 - 99 (mg/dL)     BUN  14  6 - 23 (mg/dL)     Creatinine, Ser  9.56  0.50 - 1.35 (mg/dL)     Calcium  9.5  8.4 - 10.5 (mg/dL)     Total Protein  7.3  6.0 - 8.3 (g/dL)     Albumin  3.7  3.5 - 5.2 (g/dL)     AST  18  0 - 37 (U/L)     ALT  16  0 - 53 (U/L)     Alkaline Phosphatase  80  39 - 117 (U/L)     Total Bilirubin  0.6  0.3 - 1.2 (mg/dL)     GFR calc non Af Amer  84 (*)  >90 (mL/min)     GFR calc Af Amer  >90  >90 (mL/min)    CK Status: Normal     Collection Time    10/31/11 3:49 PM   Component  Value  Range  Comment    Total CK  101  7 - 232 (U/L)    PROTIME-INR Status: Normal    Collection Time    10/31/11 3:49 PM   Component  Value  Range  Comment    Prothrombin Time  13.5  11.6 - 15.2 (seconds)     INR  1.01  0.00 - 1.49    APTT Status: Normal    Collection Time    10/31/11 3:49 PM   Component  Value  Range  Comment    aPTT  30  24 - 37 (seconds)    PRO B NATRIURETIC PEPTIDE Status: Abnormal    Collection Time    10/31/11 4:52 PM   Component  Value  Range  Comment    Pro B Natriuretic peptide (BNP)  1475.0 (*)  0 - 450 (pg/mL)    URINALYSIS, ROUTINE W REFLEX MICROSCOPIC Status: Abnormal    Collection Time    10/31/11 6:33 PM   Component  Value  Range  Comment    Color, Urine  YELLOW  YELLOW     APPearance  CLEAR  CLEAR     Specific Gravity, Urine  1.016  1.005 - 1.030     pH  7.0  5.0 - 8.0     Glucose, UA  NEGATIVE  NEGATIVE (mg/dL)     Hgb urine dipstick  NEGATIVE  NEGATIVE     Bilirubin Urine  NEGATIVE  NEGATIVE     Ketones, ur  NEGATIVE  NEGATIVE (mg/dL)     Protein, ur  >213 (*)  NEGATIVE (mg/dL)     Urobilinogen, UA  1.0  0.0 - 1.0 (mg/dL)     Nitrite  NEGATIVE  NEGATIVE     Leukocytes, UA  NEGATIVE  NEGATIVE    URINE MICROSCOPIC-ADD ON Status: Abnormal    Collection Time    10/31/11 6:33 PM   Component  Value  Range  Comment    Squamous Epithelial / LPF  FEW (*)  RARE     WBC, UA  0-2  <3 (WBC/hpf)  RBC / HPF  0-2  <3 (RBC/hpf)     Bacteria, UA  RARE  RARE     Hospital Course by problem list: -Patient was [facility] admitted by [facility] and transferred to IMTS day after admission.  # Syncope: No significant event since admission.  At admission, patient was found to be in SVT, and subsequently remained in sinus bradycardia with carvedilol (initially treated with diltiazem gtt). Syncope PTA was likely related to critical aortic stenosis found on echo vs SVT/arrythmia. Pt is S/p catheterization on 11/02/11, which  revealed stenosis of LAD (95% ostial stenosis) and RCA (90% stenosis). 2D echo shows EF 30-35% with regional wall abnormality with critical stenosis of aortic valve (0.62cm2). During hospitalization, patient had lengthy, careful discussions with primary team, cardiology and CT surgery regarding options. Options considered include medical tx (largely palliative) vs high risk AVR & CABG (by Dr. Cornelius Moras) vs percutaneous coronary retention & transcatheter AVR (by Dr. Excell Seltzer).  He did undergo medical optimization by PFTs, carotid doppler and PT evaluation.  The patient initially decided to pursue medical/palliative management, but later decided to pursue interventional options.  The patient underwent balloon aortic valvuloplasty on 11/12/11 with small improvement in aortic valve area, and PCI with 2 bare metal stents of his LAD on 11/14/11.  The patient continued to experience episodes of SVT and sinus bradycardia with 1st degree block throughout his admission.  The patient was started on amiodarone, and his coreg was reduced to 6.25 BID.  The patient will be discharged to a skilled nursing facility, and will follow-up with Cardiology.  # CAD- s/p heart cath and BMS of LAD. Please see above. Plan to continue ASA and BB, statin   # Systolic CHF: The patient presented with shortness of breath and concern for volume overload, and was initially aggressively diuresed with lasix 40mg  IV TID, then transitioned back to his home dose of lasix 40 daily.  The patient was discharged on his home dose of lasix, and will follow-up with Cardiology.  # Essential hypertension: Generally well managed during hospitalization, but did have 1 day of softer BPs (11/07/11).  BP improved by day prior to discharge.  Continue Lasix (decrease to 40 po daily), Amlodipine (decrease to 5mg  daily), Coreg, and Losartan.   # Hypokalemia: resolved. Likely related to diuresis.   # Cough: Significantly improved, likely viral vs GERD. He remains afebrile,  and no leukocytosis, therefore, antibiotics not indicated.  Being treated with Protonix.  #Social: Patient is scheduled to go to Trial on Nov 19, 2011.  He is the plaintiff and his daughter is the defendant.  I spoke with him, his HPOA (Rev Arrie Aran, [phone], [phone]) and his [facility] [phone] (w), [phone] (c)) about this issue extensively.  Dr. Milbert Coulter has written and sent a letter to the patient's attorney discussing his current hospitalization and asking to move the date of the upcoming trial, which appears to be a routine measure in such circumstances.  Mr. Ager has signed a release form (paper copy in shadow chart), and the letter was faxed to his attorney (fax # [phone]).  He does not want [place] to provide a copy to his daugther, Gery Pray.   Discharge Vitals:  BP 129/42  Pulse 55  Temp(Src) 97.8 F (36.6 C) (Oral)  Resp 17  Ht 5\' 7"  (1.702 m)  Wt 200 lb 2.8 oz (90.8 kg)  BMI 31.35 kg/m2  SpO2 94%  Discharge Labs:  Results for orders placed during the hospital encounter of  10/31/11 (from the past 24 hour(s))  CBC     Status: Abnormal   Collection Time   11/16/11  4:43 AM      Component Value Range   WBC 10.8 (*) 4.0 - 10.5 (K/uL)   RBC 4.19 (*) 4.22 - 5.81 (MIL/uL)   Hemoglobin 12.4 (*) 13.0 - 17.0 (g/dL)   HCT 54.0 (*) 98.1 - 52.0 (%)   MCV 89.5  78.0 - 100.0 (fL)   MCH 29.6  26.0 - 34.0 (pg)   MCHC 33.1  30.0 - 36.0 (g/dL)   RDW 19.1  47.8 - 29.5 (%)   Platelets 293  150 - 400 (K/uL)  BASIC METABOLIC PANEL     Status: Abnormal   Collection Time   11/16/11  4:43 AM      Component Value Range   Sodium 136  135 - 145 (mEq/L)   Potassium 4.1  3.5 - 5.1 (mEq/L)   Chloride 102  96 - 112 (mEq/L)   CO2 24  19 - 32 (mEq/L)   Glucose, Bld 111 (*) 70 - 99 (mg/dL)   BUN 26 (*) 6 - 23 (mg/dL)   Creatinine, Ser 6.21  0.50 - 1.35 (mg/dL)   Calcium 9.4  8.4 - 30.8 (mg/dL)   GFR calc non Af Amer 66 (*) >90 (mL/min)   GFR calc Af  Amer 77 (*) >90 (mL/min)    Signed: [name] 11/16/2011, 11:43 AM

## 2011-11-16 NOTE — Progress Notes (Signed)
Clinical Social Work-CSW facilitated pt d/c to [facility] with chart copy, FL2 and ambulance transportation. CSW notified pt POA. Pt unit able to locate pt's address books however have yet to locate pt hearing aid. Risk management involved per service excellence-CSW will follow up outpatient if needed with regards to hearing aid. No further inpatient needs at this time. Trula Ore Breleigh Carpino-MSW-LCSW-[phone]

## 2011-11-16 NOTE — Progress Notes (Signed)
Clinical Social Work-CSW confirmed bed availability with [facility] SN[facility]-CSW received call from POA as well as primary support who have completed paperwork at [facility]. CSW will contact MD re d/c plan for the day. In addition HCPOA relayed that pt hearing aid and 2 address books have been lost when pt left room for procedure. CSW contacted security in an attempt to locate items and has called service excellence. CSW awaiting return phone call and will update POA on status when notifying him of pt d/c. Jodean Lima, [phone]

## 2011-11-16 NOTE — Progress Notes (Signed)
Patient being discharged to SNF personal belongings placed together in black suitcase and address books found. Still no right hearing aid found. EMS called for transport. POA notified.

## 2011-11-16 NOTE — Progress Notes (Signed)
Subjective: Overnight, the patient remained in sinus bradycardia with HR in 50-60's.  No further episodes of tachycardia overnight.  Patient feels "good" this morning.  Objective: Vital signs in last 24 hours: Filed Vitals:   11/15/11 2023 11/15/11 2342 11/16/11 0400 11/16/11 0700  BP: 137/67 129/62  129/42  Pulse: 63 66 58 55  Temp: 97.7 F (36.5 C) 97.6 F (36.4 C) 97.8 F (36.6 C)   TempSrc: Oral Oral Oral   Resp:  18 17   Height:      Weight:    200 lb 2.8 oz (90.8 kg)  SpO2: 93% 92% 96% 94%   Weight change:   Intake/Output Summary (Last 24 hours) at 11/16/11 1145 Last data filed at 11/16/11 0400  Gross per 24 hour  Intake    960 ml  Output    450 ml  Net    510 ml   Physical Exam: General: alert, cooperative, NAD HEENT: large posterior head hematoma (improving), pupils equal round and reactive to light, vision grossly intact, oropharynx clear and non-erythematous Neck: supple, no lymphadenopathy Lungs: clear to ascultation bilaterally, normal work of respiration, no wheezes, rales, ronchi Heart: regular rate and rhythm, no murmurs, gallops, or rubs   Bilateral femoral artery and right radial artery sites of intervention healing, with no bruits ascultated Abdomen: soft, non-tender, non-distended, normal bowel sounds  Extremities: no cyanosis, clubbing, or edema Neurologic: alert & oriented X3, cranial nerves II-XII intact, strength grossly intact, sensation intact to light touch  Lab Results: Basic Metabolic Panel:  Lab 11/16/11 1610 11/15/11 0529 11/14/11 0410 11/10/11 0615  NA 136 137 -- --  K 4.1 4.0 -- --  CL 102 104 -- --  CO2 24 24 -- --  GLUCOSE 111* 105* -- --  BUN 26* 18 -- --  CREATININE 1.00 0.84 -- --  CALCIUM 9.4 9.6 -- --  MG -- -- 2.2 2.3  PHOS -- -- -- --   CBC:  Lab 11/16/11 0443 11/15/11 0529 11/11/11 1035  WBC 10.8* 10.1 --  NEUTROABS -- -- 6.9  HGB 12.4* 13.4 --  HCT 37.5* 39.2 --  MCV 89.5 88.7 --  PLT 293 273 --    2D Echo  11/12/11 Study Conclusions - Left ventricle: The cavity size was normal. Wall thickness was increased in a pattern of severe LVH. Systolic function was moderately to severely reduced. The estimated ejection fraction was in the range of 30% to 35%. Diffuse hypokinesis. There is akinesis of the posterior myocardium. There is severe hypokinesis of the inferolateral myocardium. - Aortic valve: Valve mobility was restricted. There was severe stenosis. Mild regurgitation. Valve area: 0.7cm^2(VTI). Valve area: 0.63cm^2 (Vmax). - Mitral valve: Calcified annulus. Mild regurgitation. - Left atrium: The atrium was mildly dilated.  Medications: I have reviewed the patient's current medications. Scheduled Meds:    . albuterol  2.5 mg Nebulization Once  . albuterol  2.5 mg Nebulization Once  . amiodarone  400 mg Oral BID  . amLODipine  5 mg Oral Daily  . aspirin EC  81 mg Oral Daily  . carvedilol  6.25 mg Oral BID  . clopidogrel  75 mg Oral Q breakfast  . docusate sodium  100 mg Oral BID  . furosemide  40 mg Oral Daily  . ipratropium  0.5 mg Nebulization Once  . losartan  50 mg Oral Daily  . loteprednol  1 drop Right Eye Daily  . pantoprazole  40 mg Oral Q1200  . simvastatin  20 mg Oral QHS  .  spironolactone  12.5 mg Oral Daily  . Travoprost (BAK Free)  1 drop Right Eye QHS   Continuous Infusions:    . sodium chloride Stopped (11/12/11 1800)   PRN Meds:.acetaminophen, chlorpheniramine-HYDROcodone, guaiFENesin-dextromethorphan, metoprolol, ondansetron (ZOFRAN) IV  Assessment/Plan: 78 yo man, history of CAD, CHF, AS, SVT, OSA, presenting with syncope, found to have critical aortic stenosis, s/p balloon valvuloplasty 2/4, s/p PCI 2/6.  # Aortic stenosis/CAD:  Patient found to have critical stenosis of aortic valve (0.62cm2), now POD #4 s/p aortic balloon valvuloplasy. Catheterization on 11/02/11, which revealed stenosis of LAD (95% ostial stenosis) and RCA (90% stenosis), now POD #2 s/p BMS  placement to LAD. --Coreg --Continue PT  --greatly appreciate cardiology's management --continue aspirin, statin, plavix  # SVT/sinus brady - patient continues to have episodes of tachy/brady, with a 1-hr episode of tachycardia this AM associated with lightheadedness.  The patient has been seen previously be EP, and had declined pacemaker or ICD. -appreciate cardiology and EP's recs -continue coreg 6.25 BID, added amiodarone -will further follow-up as outpatient with Dr. Graciela Husbands  # Systolic CHF: -continue lasix   # Essential hypertension: relatively well-controlled during this admission, though recently increasing.  Amlodipine dose decreased from 10 -> 5 earlier this admission out of concern for hypotension -- Continue Lasix 40 PO daily, Amlodipine 5mg  daily, Coreg, and Losartan -- adjust coreg and amlodipine as needed to control HR and BP  # Prophy - SCD's  # Dispo - plan for d/c to SNF today for further rehabilitation    LOS: 16 days   [name] 11/16/2011, 11:45 AM

## 2011-11-16 NOTE — Progress Notes (Signed)
Patient Name: Eugene [name] Date of Encounter: 11/16/2011  Principal Problem:  *Syncope and collapse Active Problems:  HYPERLIPIDEMIA  Essential hypertension, benign  Congestive heart failure  SVT (supraventricular tachycardia)  CAD (coronary artery disease)  Aortic stenosis    SUBJECTIVE: Pt eating well today, during tachycardia, had weakness, presyncope and hypotension but no CP. No SOB now. Still very weak. N&V last pm but eating well now.   OBJECTIVE Filed Vitals:   11/15/11 2023 11/15/11 2342 11/16/11 0400 11/16/11 0700  BP: 137/67 129/62  129/42  Pulse: 63 66 58 55  Temp: 97.7 F (36.5 C) 97.6 F (36.4 C) 97.8 F (36.6 C)   TempSrc: Oral Oral Oral   Resp:  18 17   Height:      Weight:    200 lb 2.8 oz (90.8 kg)  SpO2: 93% 92% 96% 94%    Intake/Output Summary (Last 24 hours) at 11/16/11 0853 Last data filed at 11/16/11 0400  Gross per 24 hour  Intake   1440 ml  Output    450 ml  Net    990 ml   Filed Weights   11/14/11 0507 11/15/11 0732 11/16/11 0700  Weight: 200 lb 9.9 oz (91 kg) 203 lb 7.8 oz (92.3 kg) 200 lb 2.8 oz (90.8 kg)   PHYSICAL EXAM General: Well developed, eldery white male, in no acute distress.  Neck: Supple without bruits, JVD not significantly elevated, difficult to assess secondary to body habitus. Lungs:  Resp regular and unlabored, few rales bases. Heart: RRR, S1, S2, no S3, S4, 2/6 murmur. Abdomen: Soft, non-tender, non-distended, BS + x 4.  Extremities: No clubbing, cyanosis, no edema.  Neuro: Alert and oriented X 3. Moves all extremities spontaneously. Psych: Normal affect.  LABS:  CBC: Basename 11/16/11 0443 11/15/11 0529  WBC 10.8* 10.1  NEUTROABS -- --  HGB 12.4* 13.4  HCT 37.5* 39.2  MCV 89.5 88.7  PLT 293 273   Basic Metabolic Panel: Basename 11/16/11 0443 11/15/11 0529 11/14/11 0410  NA 136 137 --  K 4.1 4.0 --  CL 102 104 --  CO2 24 24 --  GLUCOSE 111* 105* --  BUN 26* 18 --  CREATININE 1.00 0.84 --    CALCIUM 9.4 9.6 --  MG -- -- 2.2  PHOS -- -- --    TELE:  SR With an episode of PAT? Yesterday. Occ PVCs. Sinus brady to 40s.   Radiology/Studies: Dg Chest 2 View 11/11/2011  *RADIOLOGY REPORT*  Clinical Data: Increased cough.  CHEST - 2 VIEW  Comparison: Two-view chest 11/04/2011.  Findings: The heart is enlarged.  There is no edema or effusion to suggest failure.  No focal airspace disease is evident. Atherosclerotic calcifications are present within the aorta.  The degenerative changes are again noted within the thoracic spine.  IMPRESSION:  1.  Stable cardiomegaly without failure. 2.  No acute cardiopulmonary disease or significant interval change.  Original Report Authenticated By: Jamesetta Orleans. MATTERN, M.D.    Current Medications:    . albuterol  2.5 mg Nebulization Once  . albuterol  2.5 mg Nebulization Once  . amiodarone  400 mg Oral BID  . amLODipine  5 mg Oral Daily  . aspirin EC  81 mg Oral Daily  . carvedilol  6.25 mg Oral BID  . clopidogrel  75 mg Oral Q breakfast  . docusate sodium  100 mg Oral BID  . furosemide  40 mg Oral Daily  . ipratropium  0.5 mg Nebulization Once  .  losartan  50 mg Oral Daily  . loteprednol  1 drop Right Eye Daily  . pantoprazole  40 mg Oral Q1200  . simvastatin  20 mg Oral QHS  . spironolactone  12.5 mg Oral Daily  . Travoprost (BAK Free)  1 drop Right Eye QHS    ASSESSMENT AND PLAN: Assessment/Plan:  1. Syncope and collapse - etiology unclear but patient with structural and coronary heart disease as well as arrhythmia. Symptomatic with tachyarrhythmia - Amio added 2. Acute on chronic systolic heart failure, improved - resp status stable on oral Lasix and wt OK but BUN/Cr slightly trending up, ?decrease dose  3. Severe AS s/p BAV - doing well post-proc 4. Severe CAD s/p PCI of the LAD with a bare metal stent - cont Plavix, ASA 5. Atrial tach/SVT - changed beta-blocker to coreg 6.25 mg bid. Will tolerate associated episodes of bradycardia  as he does not seem to be symptomatic. Amio started yesterday, follow. 6. NSVT - beta-blocker/Amio as above  7. N&V - resolved, follow.  Rec: Continue current therapy. Work with PT, mobilize. Dispo - [facility] when bed available. If bed available today, consider d/c with eval next week in office.  Spoke with patient regarding rehab at [facility]. He says he does not remember being told this was needed but was agreeable to it.  Signed, Theodore Demark , PA-C 8:53 AM 11/16/2011'  Patient seen, examined. Available data reviewed. Agree with findings, assessment, and plan as outlined by Theodore Demark, PA-C. Pt has had no further arrhythmias since Amio started. Plan Amio 400 mg bid x 2 weeks then 200 mg daily thereafter. Will arrange f/u with Dr Antoine Poche in about 2 weeks. Continue ASA and plavix. Ultimately will see in the multidisciplinary valve clinic.  Tonny Bollman, M.D. 11/16/2011 1:39 PM

## 2011-11-19 ENCOUNTER — Telehealth: Payer: Self-pay | Admitting: Cardiovascular Disease

## 2011-11-19 NOTE — Telephone Encounter (Signed)
New msg Brandy from [place] called. She wants to verify if ok he can go home from [facility] if they can find someone to stay  With him at night. Please call her back

## 2011-11-19 NOTE — Telephone Encounter (Signed)
SPOKE WITH BRANDY INFORMED PT  SHOULD BE DISCHARGED  BY MEDICAL DIRECTOR AT FACILITY  PER BRANDY PT TOLD HER THAT DR COOPER STATED IF SOMEONE COULD STAY WITH HIM AT NIGHT WOULD NOT HAVE TO STAY AT FACILITY  WILL FORWARD TO  DR Excell Seltzer FOR REVIEW./CY

## 2011-11-19 NOTE — Telephone Encounter (Signed)
F/U  [facility] [phone] ext [phone]  Wants to know if Dr. Stann Mainland ok patient release from [facility], please return call to [facility].

## 2011-11-21 NOTE — Telephone Encounter (Signed)
Called and left msg with Brandy to call back if needed.  Tonny Bollman 11/21/2011 5:12 PM

## 2011-11-25 ENCOUNTER — Inpatient Hospital Stay (HOSPITAL_COMMUNITY)
Admission: EM | Admit: 2011-11-25 | Discharge: 2011-11-29 | DRG: 378 | Disposition: A | Discharge status: Skilled Nursing Facility | Payer: Medicare Other | Source: Ambulatory Visit | Attending: Internal Medicine | Admitting: Internal Medicine

## 2011-11-25 ENCOUNTER — Other Ambulatory Visit: Payer: Self-pay

## 2011-11-25 ENCOUNTER — Encounter (HOSPITAL_COMMUNITY): Payer: Self-pay

## 2011-11-25 DIAGNOSIS — Z7902 Long term (current) use of antithrombotics/antiplatelets: Secondary | ICD-10-CM

## 2011-11-25 DIAGNOSIS — K5731 Diverticulosis of large intestine without perforation or abscess with bleeding: Principal | ICD-10-CM | POA: Diagnosis present

## 2011-11-25 DIAGNOSIS — I2510 Atherosclerotic heart disease of native coronary artery without angina pectoris: Secondary | ICD-10-CM | POA: Diagnosis present

## 2011-11-25 DIAGNOSIS — Z947 Corneal transplant status: Secondary | ICD-10-CM

## 2011-11-25 DIAGNOSIS — K922 Gastrointestinal hemorrhage, unspecified: Secondary | ICD-10-CM | POA: Diagnosis present

## 2011-11-25 DIAGNOSIS — I495 Sick sinus syndrome: Secondary | ICD-10-CM | POA: Diagnosis present

## 2011-11-25 DIAGNOSIS — I10 Essential (primary) hypertension: Secondary | ICD-10-CM | POA: Diagnosis present

## 2011-11-25 DIAGNOSIS — G4733 Obstructive sleep apnea (adult) (pediatric): Secondary | ICD-10-CM | POA: Diagnosis present

## 2011-11-25 DIAGNOSIS — I5022 Chronic systolic (congestive) heart failure: Secondary | ICD-10-CM | POA: Diagnosis present

## 2011-11-25 DIAGNOSIS — E785 Hyperlipidemia, unspecified: Secondary | ICD-10-CM | POA: Diagnosis present

## 2011-11-25 DIAGNOSIS — Z9861 Coronary angioplasty status: Secondary | ICD-10-CM

## 2011-11-25 DIAGNOSIS — H3530 Unspecified macular degeneration: Secondary | ICD-10-CM | POA: Diagnosis present

## 2011-11-25 DIAGNOSIS — I509 Heart failure, unspecified: Secondary | ICD-10-CM | POA: Diagnosis present

## 2011-11-25 DIAGNOSIS — J309 Allergic rhinitis, unspecified: Secondary | ICD-10-CM | POA: Diagnosis present

## 2011-11-25 DIAGNOSIS — Z79899 Other long term (current) drug therapy: Secondary | ICD-10-CM

## 2011-11-25 DIAGNOSIS — H409 Unspecified glaucoma: Secondary | ICD-10-CM | POA: Diagnosis present

## 2011-11-25 DIAGNOSIS — I359 Nonrheumatic aortic valve disorder, unspecified: Secondary | ICD-10-CM | POA: Diagnosis present

## 2011-11-25 DIAGNOSIS — K5521 Angiodysplasia of colon with hemorrhage: Secondary | ICD-10-CM | POA: Diagnosis present

## 2011-11-25 LAB — CARDIAC PANEL(CRET KIN+CKTOT+MB+TROPI)
CK, MB: 2.7 ng/mL (ref 0.3–4.0)
Relative Index: INVALID (ref 0.0–2.5)
Troponin I: <0.3 ng/mL (ref <0.30)
Troponin I: <0.3 ng/mL (ref <0.30)

## 2011-11-25 LAB — CBC
HCT: 35.7 % — ABNORMAL LOW (ref 39.0–52.0)
MCH: 29.8 pg (ref 26.0–34.0)
MCV: 90.2 fL (ref 78.0–100.0)
Platelets: 221 10*3/uL (ref 150–400)
RBC: 3.96 MIL/uL — ABNORMAL LOW (ref 4.22–5.81)
WBC: 7.7 10*3/uL (ref 4.0–10.5)

## 2011-11-25 LAB — TYPE AND SCREEN
ABO/RH(D): O POS
Antibody Screen: NEGATIVE

## 2011-11-25 LAB — BASIC METABOLIC PANEL
BUN: 23 mg/dL (ref 6–23)
CO2: 25 mEq/L (ref 19–32)
Calcium: 8.8 mg/dL (ref 8.4–10.5)
Creatinine, Ser: 0.99 mg/dL (ref 0.50–1.35)
GFR calc non Af Amer: 73 mL/min — ABNORMAL LOW (ref >90)
Glucose, Bld: 103 mg/dL — ABNORMAL HIGH (ref 70–99)
Sodium: 140 mEq/L (ref 135–145)

## 2011-11-25 LAB — DIFFERENTIAL
Eosinophils Absolute: 0.3 10*3/uL (ref 0.0–0.7)
Eosinophils Relative: 4 % (ref 0–5)
Lymphocytes Relative: 20 % (ref 12–46)
Lymphs Abs: 1.5 10*3/uL (ref 0.7–4.0)
Monocytes Absolute: 0.6 10*3/uL (ref 0.1–1.0)

## 2011-11-25 LAB — OCCULT BLOOD X 1 CARD TO LAB, STOOL: Fecal Occult Bld: POSITIVE

## 2011-11-25 MED ORDER — ASPIRIN 81 MG PO TABS: 81.0000 mg | ORAL_TABLET | Freq: Every day | ORAL | Status: DC

## 2011-11-25 MED ORDER — AMIODARONE HCL 200 MG PO TABS: 200.0000 mg | ORAL_TABLET | Freq: Every day | ORAL | Status: DC

## 2011-11-25 MED ORDER — AMIODARONE HCL 200 MG PO TABS
200.0000 mg | ORAL_TABLET | Freq: Two times a day (BID) | ORAL | Status: DC
  Administered 2011-11-25 – 2011-11-29 (×8): 200 mg via ORAL
  Filled 2011-11-25 (×9): qty 1

## 2011-11-25 MED ORDER — ROSUVASTATIN CALCIUM 10 MG PO TABS
10.0000 mg | ORAL_TABLET | Freq: Every day | ORAL | Status: DC
  Administered 2011-11-26 – 2011-11-28 (×2): 10 mg via ORAL
  Filled 2011-11-25 (×5): qty 1

## 2011-11-25 MED ORDER — FUROSEMIDE 40 MG PO TABS
40.0000 mg | ORAL_TABLET | Freq: Every day | ORAL | Status: DC
  Administered 2011-11-25 – 2011-11-29 (×5): 40 mg via ORAL
  Filled 2011-11-25 (×5): qty 1

## 2011-11-25 MED ORDER — TRAVOPROST 0.004 % OP SOLN: 1.0000 [drp] | Freq: Every day | OPHTHALMIC | Status: DC

## 2011-11-25 MED ORDER — ACETAMINOPHEN 650 MG RE SUPP: 650.0000 mg | Freq: Four times a day (QID) | RECTAL | Status: DC | PRN

## 2011-11-25 MED ORDER — SODIUM CHLORIDE 0.9 % IV SOLN
Freq: Once | INTRAVENOUS | Status: AC
  Administered 2011-11-25: 09:00:00 via INTRAVENOUS

## 2011-11-25 MED ORDER — AMLODIPINE BESYLATE 5 MG PO TABS
5.0000 mg | ORAL_TABLET | Freq: Every day | ORAL | Status: DC
  Administered 2011-11-25: 5 mg via ORAL
  Filled 2011-11-25 (×2): qty 1

## 2011-11-25 MED ORDER — ASPIRIN 81 MG PO CHEW
81.0000 mg | CHEWABLE_TABLET | Freq: Every day | ORAL | Status: DC
  Administered 2011-11-25: 81 mg via ORAL
  Filled 2011-11-25: qty 1

## 2011-11-25 MED ORDER — CARVEDILOL 6.25 MG PO TABS
6.2500 mg | ORAL_TABLET | Freq: Two times a day (BID) | ORAL | Status: DC
  Administered 2011-11-25 – 2011-11-29 (×7): 6.25 mg via ORAL
  Filled 2011-11-25 (×9): qty 1

## 2011-11-25 MED ORDER — SIMVASTATIN 40 MG PO TABS: 40.0000 mg | ORAL_TABLET | Freq: Every day | ORAL | Status: DC

## 2011-11-25 MED ORDER — LOSARTAN POTASSIUM 50 MG PO TABS
50.0000 mg | ORAL_TABLET | Freq: Every day | ORAL | Status: DC
  Administered 2011-11-25: 50 mg via ORAL
  Filled 2011-11-25 (×2): qty 1

## 2011-11-25 MED ORDER — ONDANSETRON HCL 4 MG PO TABS: 4.0000 mg | ORAL_TABLET | Freq: Four times a day (QID) | ORAL | Status: DC | PRN

## 2011-11-25 MED ORDER — DOCUSATE SODIUM 100 MG PO CAPS
100.0000 mg | ORAL_CAPSULE | Freq: Two times a day (BID) | ORAL | Status: DC
  Administered 2011-11-28 – 2011-11-29 (×3): 100 mg via ORAL
  Filled 2011-11-25 (×4): qty 1

## 2011-11-25 MED ORDER — ACETAMINOPHEN 325 MG PO TABS
650.0000 mg | ORAL_TABLET | Freq: Four times a day (QID) | ORAL | Status: DC | PRN
  Administered 2011-11-27: 650 mg via ORAL
  Filled 2011-11-25: qty 2

## 2011-11-25 MED ORDER — DOCUSATE SODIUM 100 MG PO CAPS: 100.0000 mg | ORAL_CAPSULE | Freq: Two times a day (BID) | ORAL | Status: DC

## 2011-11-25 MED ORDER — PANTOPRAZOLE SODIUM 40 MG PO TBEC
40.0000 mg | DELAYED_RELEASE_TABLET | Freq: Every day | ORAL | Status: DC
  Administered 2011-11-25 – 2011-11-29 (×5): 40 mg via ORAL
  Filled 2011-11-25 (×5): qty 1

## 2011-11-25 MED ORDER — SODIUM CHLORIDE 0.9 % IV BOLUS (SEPSIS)
500.0000 mL | Freq: Once | INTRAVENOUS | Status: AC
  Administered 2011-11-25: 500 mL via INTRAVENOUS

## 2011-11-25 MED ORDER — TRAVOPROST (BAK FREE) 0.004 % OP SOLN
1.0000 [drp] | Freq: Every day | OPHTHALMIC | Status: DC
  Administered 2011-11-25 – 2011-11-28 (×4): 1 [drp] via OPHTHALMIC
  Filled 2011-11-25 (×2): qty 2.5

## 2011-11-25 MED ORDER — ONDANSETRON HCL 4 MG/2ML IJ SOLN: 4.0000 mg | Freq: Four times a day (QID) | INTRAMUSCULAR | Status: DC | PRN

## 2011-11-25 MED ORDER — LOTEPREDNOL ETABONATE 0.5 % OP SUSP
1.0000 [drp] | Freq: Four times a day (QID) | OPHTHALMIC | Status: DC
  Administered 2011-11-25 – 2011-11-29 (×9): 1 [drp] via OPHTHALMIC
  Filled 2011-11-25 (×3): qty 5

## 2011-11-25 MED ORDER — SPIRONOLACTONE 12.5 MG HALF TABLET
12.5000 mg | ORAL_TABLET | Freq: Every day | ORAL | Status: DC
  Administered 2011-11-25 – 2011-11-29 (×5): 12.5 mg via ORAL
  Filled 2011-11-25 (×5): qty 1

## 2011-11-25 MED ORDER — AMIODARONE HCL 200 MG PO TABS: 400.0000 mg | ORAL_TABLET | Freq: Two times a day (BID) | ORAL | Status: DC

## 2011-11-25 NOTE — ED Notes (Signed)
Pt assisted back to stretcher after passing a large amount of dark red gelatenous bloody stool on the bedside commode. [facility] at bedside.

## 2011-11-25 NOTE — ED Notes (Signed)
Report called to Elijah Birk, RN at [facility]

## 2011-11-25 NOTE — ED Notes (Signed)
Report to Crystal from [facility]

## 2011-11-25 NOTE — ED Notes (Signed)
Patient is resting comfortably. 

## 2011-11-25 NOTE — ED Provider Notes (Signed)
History     CSN: [id number]  Arrival date & time 11/25/11  7829   First MD Initiated Contact with Patient 11/25/11 [phone]      Chief Complaint  Patient presents with  . Rectal Bleeding    (Consider location/radiation/quality/duration/timing/severity/associated sxs/prior treatment) Patient is a 78 y.o. male presenting with hematochezia. The history is provided by the patient.  Rectal Bleeding  The current episode started yesterday. The problem has been rapidly improving. Pertinent negatives include no fever, no abdominal pain, no diarrhea, no nausea, no rectal pain, no vomiting, no chest pain, no headaches, no difficulty breathing and no rash. He has been behaving normally.   Pt with multiple previous episodes of the same, prev admitted last June for GI bleeding. Pt started passing dark blood and clots per rectum at 0130 today and has had several episodes since. Pt estimates he has passed "a pint to a quart" of blood during this time. He denies fever chills, abd pain, CP, SOB, HA, dizziness.  Past Medical History  Diagnosis Date  . Glaucoma     Right eye  . Diverticulosis   . Macular degeneration of right eye   . Allergic rhinitis   . CAD (coronary artery disease)     status post percutaneous transluminal coronary angioplasty in 1991.  . OSA on CPAP   . Hypertension   . Hyperlipidemia   . Chronic CHF (congestive heart failure)     ejection fraction of 40-45%, grade 2 diastolic  dysfunction, and mild aortic stenosis  . Supraventricular tachycardia     2 syndromes-nonsustained atrial tachycardia//adenosine responsive diuretic positive reentry probably AV node reentry  . Shortness of breath   . Sleep apnea     uses cpap  . GI bleed     hx of GI Bleed    Past Surgical History  Procedure Date  . Implantation of [place]erveldt glaucoma device lant with scleral reinforcement using tutoplast tissue graft right eye.   . Coronary angioplasty with stent placement   . Corneal transplant       History reviewed. No pertinent family history.  History  Substance Use Topics  . Smoking status: Former Smoker    Types: Cigarettes    Quit date: 10/08/1960  . Smokeless tobacco: Never Used  . Alcohol Use: No      Review of Systems  Constitutional: Negative for fever and chills.  Respiratory: Negative for chest tightness and shortness of breath.   Cardiovascular: Negative for chest pain, palpitations and leg swelling.  Gastrointestinal: Positive for hematochezia and anal bleeding. Negative for nausea, vomiting, abdominal pain, diarrhea and rectal pain.  Genitourinary: Negative for flank pain.  Musculoskeletal: Negative for myalgias, back pain, joint swelling and arthralgias.  Skin: Negative for color change, pallor and rash.  Neurological: Negative for dizziness, weakness, light-headedness, numbness and headaches.    Allergies  Penicillins  Home Medications   Current Outpatient Rx  Name Route Sig Dispense Refill  . AMIODARONE HCL 200 MG PO TABS Oral Take 1 tablet (200 mg total) by mouth daily. 30 tablet 2    Take 400 mg twice daily for 2 weeks, then 200 mg d ...  . AMIODARONE HCL 400 MG PO TABS Oral Take 1 tablet (400 mg total) by mouth 2 (two) times daily. 30 tablet 1    Take 400 mg twice daily for 2 weeks, then 200 mg d ...  . AMLODIPINE BESYLATE 5 MG PO TABS Oral Take 1 tablet (5 mg total) by mouth daily. 30 tablet  0  . ASPIRIN 81 MG PO TABS Oral Take 81 mg by mouth daily.      Marland Kitchen CARVEDILOL 6.25 MG PO TABS Oral Take 1 tablet (6.25 mg total) by mouth 2 (two) times daily. 120 tablet 0  . CLOPIDOGREL BISULFATE 75 MG PO TABS Oral Take 1 tablet (75 mg total) by mouth daily with breakfast. 30 tablet 2  . DOCUSATE SODIUM 100 MG PO CAPS Oral Take 100 mg by mouth 2 (two) times daily.      . FUROSEMIDE 20 MG PO TABS Oral Take 2 tablets (40 mg total) by mouth daily. 30 tablet 0  . LOSARTAN POTASSIUM 50 MG PO TABS Oral Take 50 mg by mouth daily.    Marland Kitchen LOTEPREDNOL ETABONATE 0.5 %  OP SUSP Right Eye Place 1 drop into the right eye 4 (four) times daily.      Marland Kitchen PANTOPRAZOLE SODIUM 40 MG PO TBEC Oral Take 1 tablet (40 mg total) by mouth daily at 12 noon. 30 tablet 0  . SIMVASTATIN 40 MG PO TABS Oral Take 40 mg by mouth at bedtime.    . SPIRONOLACTONE 12.5 MG HALF TABLET Oral Take 0.5 tablets (12.5 mg total) by mouth daily. 30 tablet 0  . TRAVOPROST 0.004 % OP SOLN Right Eye Place 1 drop into the right eye at bedtime.        BP 123/60  Pulse 57  Temp(Src) 98.1 F (36.7 C) (Oral)  Resp 16  SpO2 95%  Physical Exam  Nursing note and vitals reviewed. Constitutional: He is oriented to person, place, and time. He appears well-developed and well-nourished. No distress.  HENT:  Head: Normocephalic and atraumatic.  Mouth/Throat: Oropharynx is clear and moist.  Eyes: EOM are normal. Pupils are equal, round, and reactive to light.  Neck: Normal range of motion. Neck supple.  Cardiovascular: Normal rate and regular rhythm.   Pulmonary/Chest: Effort normal and breath sounds normal. No respiratory distress. He has no wheezes. He has no rales.  Abdominal: Soft. Bowel sounds are normal. There is no tenderness. There is no rebound and no guarding.  Genitourinary:       Pt with dark clots in rectum and in diaper. No bright red blood. No fissures or hemorrhoids.   Musculoskeletal: Normal range of motion. He exhibits no edema and no tenderness.  Neurological: He is alert and oriented to person, place, and time.  Skin: Skin is warm and dry. No rash noted. No erythema.  Psychiatric: He has a normal mood and affect. His behavior is normal.    ED Course  Procedures (including critical care time)  Labs Reviewed  CBC - Abnormal; Notable for the following:    RBC 3.96 (*)    Hemoglobin 11.8 (*)    HCT 35.7 (*)    All other components within normal limits  BASIC METABOLIC PANEL - Abnormal; Notable for the following:    Glucose, Bld 103 (*)    GFR calc non Af Amer 73 (*)    GFR  calc Af Amer 84 (*)    All other components within normal limits  DIFFERENTIAL  PROTIME-INR  APTT  CARDIAC PANEL(CRET KIN+CKTOT+MB+TROPI)  TYPE AND SCREEN   No results found.   1. GI bleed      Date: 11/25/2011  Rate: 61  Rhythm: normal sinus rhythm  QRS Axis: normal  Intervals: PR prolonged  ST/T Wave abnormalities: ST depressions laterally  Conduction Disutrbances:none  Narrative Interpretation:   Old EKG Reviewed: Compared with 11/15/11. New inverted  t waves in lat leads and resolved t wave inversion in inf leads    MDM   Discussed with 3rd yr internal medicine resident, Dr Eben Burow. He is aware of pt current stable condition and new EKG findings. Pt remains asymptomatic with stable VS. He had another mild-mod bloody BM.        Loren Racer, MD 11/25/11 [phone]

## 2011-11-25 NOTE — H&P (Signed)
Hospital Admission Note Date: 11/25/2011  Patient name: Eugene [name] Medical record number: [id number] Date of birth: Feb 26, 1924 Age: 78 y.o. Gender: male PCP: Melida Quitter, MD, MD  Medical Service:  Mervyn Gay  Attending physician:   Dr. Judyann Munson  1st Contact:    Dr. [name] Pager:  [phone] 2nd Contact:    Dr. Lars Mage  Pager:  [phone] After 5 pm or weekends: 1st Contact:      Pager: [phone] 2nd Contact:      Pager: [phone]  Chief Complaint: GI Bleed  History of Present Illness: Eugene [name] is an 78 year old man known to our service with history of multiple GI bleeds, 3 vessel CAD, aortic stenosis, HTN, CHF who presents with gross red blood and clots in his stools since 1:30am this morning.  He has had roughly 6 stools today.  The first was very hard and painful, but the subsequent stools have been normal texture and fairly painless.  He feels a little weak now and a little lightheaded.  He comes from a SNF where he was getting rehab from recent hospitalization.  He was recently hospitalized in the past month for syncope due to aortic stenosis.  He underwent balloon valvuloplasty and PCI stenting of LAD.  He was placed on Plavix in addition to his Aspirin after stent was placed on 11/14/11.  Instructions were to take dual antiplatelet therapy for at least 30 days, and he has outpatient fu appt in two days 11/27/11 with interventional cardiology.  He gives a history of multiple GI bleeds.  He says that they happen about every six months.  Last admission for GI bleed was 01/2011 for GI bleed thought to be due to diverticulosis.  Patient thinks he had colonoscopy that admission but there is no record of one done in [facility].  Discharge summary mentions a 2006 colonoscopy found diverticulosis, but official report for this cannot be found in [facility].    No fevers or chills.  No abd pain, CP, SOB, diarrhea, nausea, vomiting.  No dysuria or hematuria.  No numbness or tingling  anywhere.   Meds:  Medications Prior to Admission  Medication Sig Dispense Refill  . amiodarone (PACERONE) 200 MG tablet Take 1 tablet (200 mg total) by mouth daily.  30 tablet  2  . amiodarone (PACERONE) 400 MG tablet Take 1 tablet (400 mg total) by mouth 2 (two) times daily.  30 tablet  1  . amLODipine (NORVASC) 5 MG tablet Take 1 tablet (5 mg total) by mouth daily.  30 tablet  0  . aspirin 81 MG tablet Take 81 mg by mouth daily.        . carvedilol (COREG) 6.25 MG tablet Take 1 tablet (6.25 mg total) by mouth 2 (two) times daily.  120 tablet  0  . clopidogrel (PLAVIX) 75 MG tablet Take 1 tablet (75 mg total) by mouth daily with breakfast.  30 tablet  2  . docusate sodium (COLACE) 100 MG capsule Take 100 mg by mouth 2 (two) times daily.        . furosemide (LASIX) 20 MG tablet Take 2 tablets (40 mg total) by mouth daily.  30 tablet  0  . losartan (COZAAR) 50 MG tablet Take 50 mg by mouth daily.      Marland Kitchen loteprednol (LOTEMAX) 0.5 % ophthalmic suspension Place 1 drop into the right eye 4 (four) times daily.        . pantoprazole (PROTONIX) 40 MG tablet Take 1  tablet (40 mg total) by mouth daily at 12 noon.  30 tablet  0  . simvastatin (ZOCOR) 40 MG tablet Take 40 mg by mouth at bedtime.      Marland Kitchen spironolactone (ALDACTONE) 12.5 mg TABS Take 0.5 tablets (12.5 mg total) by mouth daily.  30 tablet  0  . travoprost, benzalkonium, (TRAVATAN) 0.004 % ophthalmic solution Place 1 drop into the right eye at bedtime.          Allergies: Penicillins  Past Medical History  Diagnosis Date  . Glaucoma     Right eye  . Diverticulosis   . Macular degeneration of right eye   . Allergic rhinitis   . CAD (coronary artery disease)     status post percutaneous transluminal coronary angioplasty in 1991.  . OSA on CPAP   . Hypertension   . Hyperlipidemia   . Chronic CHF (congestive heart failure)     ejection fraction of 40-45%, grade 2 diastolic  dysfunction, and mild aortic stenosis  . Supraventricular  tachycardia     2 syndromes-nonsustained atrial tachycardia//adenosine responsive diuretic positive reentry probably AV node reentry  . Shortness of breath   . Sleep apnea     uses cpap  . GI bleed     hx of GI Bleed   Past Surgical History  Procedure Date  . Implantation of baerveldt glaucoma device lant with scleral reinforcement using tutoplast tissue graft right eye.   . Coronary angioplasty with stent placement   . Corneal transplant    History reviewed. No pertinent family history. History   Social History  . Marital Status: Widowed    Spouse Name: N/A    Number of Children: N/A  . Years of Education: N/A   Occupational History  . Not on file.   Social History Main Topics  . Smoking status: Former Smoker    Types: Cigarettes    Quit date: 10/08/1960  . Smokeless tobacco: Never Used  . Alcohol Use: No  . Drug Use: No  . Sexually Active: No   Other Topics Concern  . Not on file   Social History Narrative  . No narrative on file    Review of Systems: See HPI  Physical Exam: Blood pressure 147/73, pulse 68, temperature 98.2 F (36.8 C), temperature source Oral, resp. rate 17, height 5\' 7"  (1.702 m), weight 203 lb 11.3 oz (92.4 kg), SpO2 98.00%.  General: alert, well-developed, and cooperative to examination.  Head: normocephalic and atraumatic.  Eyes: vision grossly intact, pupils equal, pupils round, pupils reactive to light, no injection and anicteric.  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 Mild conjunctival pallor L eye, no conjunctival pallor R eye (postsurgical eye). Mouth: pharynx pink and moist, no erythema, and no exudates.  Neck: no JVD Lungs: normal respiratory effort, no accessory muscle use, normal breath sounds, no crackles, and no wheezes. Heart: normal rate, regular rhythm, 2/6 systolic murmur with possible extension into diastole, no gallop, and no rub.  Abdomen: soft, non-tender, normal bowel sounds, no distention, no guarding, no  rebound tenderness Rectal: Good tone. Non-bleeding external  Hemorrhoids.  Gross red blood and clots present. Pulses: 2+ DP/PT pulses bilaterally Extremities: 1+ pitting edema up to knees bilaterally.  No cyanosis, clubbing. Neurologic: alert & oriented X3, cranial nerves II-XII intact, strength normal in all extremities. Skin: turgor normal and no rashes.     Lab results: Basic Metabolic Panel:  Basename 11/25/11 0715  NA 140  K 4.1  CL 106  CO2 25  GLUCOSE 103*  BUN 23  CREATININE 0.99  CALCIUM 8.8  MG --  PHOS --   CBC:  Basename 11/25/11 0715  WBC 7.7  NEUTROABS 5.1  HGB 11.8*  HCT 35.7*  MCV 90.2  PLT 221   Cardiac Enzymes:  Basename 11/25/11 0715  CKTOTAL 29  CKMB 2.7  CKMBINDEX --  TROPONINI <0.30   Coagulation:  Basename 11/25/11 0715  LABPROT 13.8  INR 1.04   Urine Drug Screen: Drugs of Abuse     Component Value Date/Time   LABOPIA NONE DETECTED 11/23/2010 0646   COCAINSCRNUR NONE DETECTED 11/23/2010 0646   LABBENZ NONE DETECTED 11/23/2010 0646   AMPHETMU NONE DETECTED 11/23/2010 0646   THCU NONE DETECTED 11/23/2010 0646   LABBARB  Value: NONE DETECTED        DRUG SCREEN FOR MEDICAL PURPOSES ONLY.  IF CONFIRMATION IS NEEDED FOR ANY PURPOSE, NOTIFY LAB WITHIN 5 DAYS.        LOWEST DETECTABLE LIMITS FOR URINE DRUG SCREEN Drug Class       Cutoff (ng/mL) Amphetamine      1000 Barbiturate      200 Benzodiazepine   200 Tricyclics       300 Opiates          300 Cocaine          300 THC              50 11/23/2010 0646     Other results: EKG: Normal sinus rhythm, 1st degree AV block (present on previous), T wave changes in lead I and aVL that were present on prior exam.  Left axis deviation.  Compared to previous from 11/15/11, there are new T wave inversions in V4-6. New normalization of previous T wave inversions in II, III, and aVF.   Assessment & Plan by Problem:  GI Bleed: Gross red blood/clots in stools since 1:30am this morning.  Still blood and clots  on rectal exam on admission.  In setting of double antiplatelet therapy ASA + Plavix for baremetal PCI stent placed 11 days ago. Hgb slightly below baseline but not significantly decreased.  BP and HR stable.   History of multiple GI bleeds, about every 6 months per patient report.  Last admission for this on record April 2012.  Thought to be due to diverticulosis in the past, which was seen on 2006 colonoscopy per discharge summary from April 2012.  On initial chart review no colonoscopy record can be found in [facility].    Differential for his hematochezia includes diverticulosis vs. AVM being most common possibilities, less likely malignancy or brisk upper GI bleed (less likely due to stable Hgb and vitals).    -Admit to step down bed -Clear liquid diet -Check orthostatic vitals -CBCs Q4H -Protonix  -Continue stool softener -Likely will need GI consult in morning if bleeding continues -Hold Plavix, continue Aspirin  EKG Changes: EKG on admission shows changes from previous done 11/15/11, which was after his PCI stent placement on 11/14/11.  Possibly due to demand ischemia. Will cycle cardiac enzymes x 3 sets.  CAD: Will continue aspirin for now, consider stopping if bleeding continues through the morning.  May need to consult cardiology before stopping antiplatelet therapy altogether.  Continue statin, will switch to Crestor due to interaction of simvastatin and amiodarone.  Chronic Systolic CHF:  Continue Coreg, Losartan, Lasix, and Spironolactone.  Will be cautious with IV fluids since he already received 1 L NS in the ED at [facility].  Strict Ins and Outs, daily weights.    HTN: Will continue home meds for now as blood pressures look good.  Glaucoma: R eye has history of corneal transplant.  Will continue home eye drops of steroid and prostaglandin in R eye.  OSA: Will continue CPAP.  Code Status: Patient does not want to be intubated.  He does want CPR, defibrillation, or pressors any of  these are ever needed.  DVT Proph: SCDs, no anticoagulation in setting of acute bleeding    Signed: Yaakov Guthrie, BRAD 11/25/2011, 8:22 PM

## 2011-11-25 NOTE — ED Notes (Signed)
Patient denies pain and is resting comfortably.  

## 2011-11-25 NOTE — ED Notes (Signed)
Called [facility] for transport, states both trucks are currently on calls and they would come as soon as they could, estimate about an hour wait time.

## 2011-11-25 NOTE — Progress Notes (Signed)
Pharmacy: P&T Policy  Substituted crestor 10 mg for simvastatin 40 mg.  In patients on amiodarone, 20 mg daily is the max recommended dose of simvastatin.  Please consider changing upon d/c.  Thanks!  Janda Cargo L. Illene Bolus, PharmD, BCPS Clinical Pharmacist Pager: [phone] 11/25/2011 8:28 PM

## 2011-11-25 NOTE — ED Notes (Signed)
ZOX:[id number]<AV> Expected date:11/25/11<BR> Expected time: 6:46 AM<BR> Means of arrival:Ambulance<BR> Comments:<BR> Rectal/GI bleed

## 2011-11-25 NOTE — ED Notes (Signed)
Vital signs stable. 

## 2011-11-25 NOTE — ED Notes (Signed)
MD at bedside. 

## 2011-11-25 NOTE — ED Notes (Signed)
Pt sts rectal bleeding starting after passing hard stool. Sts bleeding had stopped by 04:30

## 2011-11-26 LAB — CBC
HCT: 26.2 % — ABNORMAL LOW (ref 39.0–52.0)
HCT: 32 % — ABNORMAL LOW (ref 39.0–52.0)
Hemoglobin: 8.3 g/dL — ABNORMAL LOW (ref 13.0–17.0)
Hemoglobin: 10.6 g/dL — ABNORMAL LOW (ref 13.0–17.0)
Hemoglobin: 8.6 g/dL — ABNORMAL LOW (ref 13.0–17.0)
MCH: 30 pg (ref 26.0–34.0)
MCHC: 33.3 g/dL (ref 30.0–36.0)
MCV: 88.2 fL (ref 78.0–100.0)
Platelets: 171 10*3/uL (ref 150–400)
Platelets: 176 10*3/uL (ref 150–400)
RBC: 2.87 MIL/uL — ABNORMAL LOW (ref 4.22–5.81)
RBC: 2.97 MIL/uL — ABNORMAL LOW (ref 4.22–5.81)
RBC: 3.58 MIL/uL — ABNORMAL LOW (ref 4.22–5.81)
RDW: 15.7 % — ABNORMAL HIGH (ref 11.5–15.5)
RDW: 15.7 % — ABNORMAL HIGH (ref 11.5–15.5)
RDW: 17.1 % — ABNORMAL HIGH (ref 11.5–15.5)
RDW: 17.2 % — ABNORMAL HIGH (ref 11.5–15.5)
WBC: 12.1 10*3/uL — ABNORMAL HIGH (ref 4.0–10.5)
WBC: 7.4 10*3/uL (ref 4.0–10.5)
WBC: 9.3 10*3/uL (ref 4.0–10.5)

## 2011-11-26 LAB — BASIC METABOLIC PANEL
BUN: 20 mg/dL (ref 6–23)
Calcium: 8.6 mg/dL (ref 8.4–10.5)
Creatinine, Ser: 0.86 mg/dL (ref 0.50–1.35)
GFR calc Af Amer: 89 mL/min — ABNORMAL LOW (ref >90)
GFR calc non Af Amer: 77 mL/min — ABNORMAL LOW (ref >90)
Potassium: 3.8 mEq/L (ref 3.5–5.1)

## 2011-11-26 LAB — CARDIAC PANEL(CRET KIN+CKTOT+MB+TROPI)
CK, MB: 2.7 ng/mL (ref 0.3–4.0)
CK, MB: 2.9 ng/mL (ref 0.3–4.0)
Total CK: 33 U/L (ref 7–232)
Troponin I: <0.3 ng/mL (ref <0.30)
Troponin I: <0.3 ng/mL (ref <0.30)

## 2011-11-26 LAB — PREPARE RBC (CROSSMATCH)

## 2011-11-26 MED ORDER — SODIUM CHLORIDE 0.9 % IV SOLN
INTRAVENOUS | Status: DC
  Administered 2011-11-26: 75 mL/h via INTRAVENOUS
  Administered 2011-11-27: via INTRAVENOUS

## 2011-11-26 NOTE — Progress Notes (Signed)
11-26-11 UR completed. Jilda Kress RN BSN  

## 2011-11-26 NOTE — Progress Notes (Addendum)
Subjective: Patient admitted yesterday with BRBPR, with about 5-6 bloody stools during the day and leakage of bright red blood between bowel movements.  Patient now notes slowing of BRBPR.  We discussed the possible need for colonoscopy.  Patient does not want colonoscopy at this time, thinking that his bleed will stop, but agrees to revisit this topic this afternoon if bleeding not stopping.  Objective: Vital signs in last 24 hours: Filed Vitals:   11/26/11 0823 11/26/11 0923 11/26/11 1023 11/26/11 1120  BP: 99/54 117/52 95/47 103/46  Pulse: 77     Temp: 97.6 F (36.4 C) 98.1 F (36.7 C) 97.9 F (36.6 C) 97.6 F (36.4 C)  TempSrc: Oral Oral Oral Oral  Resp: 16 21 15 15   Height:      Weight:      SpO2:    95%   Weight change:   Intake/Output Summary (Last 24 hours) at 11/26/11 1304 Last data filed at 11/26/11 0800  Gross per 24 hour  Intake   1980 ml  Output   1550 ml  Net    430 ml   Physical Exam: General: alert, cooperative, and in no apparent distress HEENT: healing posterior head wound, pupils equal round and reactive to light, vision grossly intact, hearing somewhat impaired, oropharynx clear and non-erythematous  Neck: supple, no lymphadenopathy, grade II/VI systolic murmur Lungs: clear to ascultation bilaterally, normal work of respiration, no wheezes, rales, ronchi Heart: regular rate and rhythm Abdomen: soft, non-tender, non-distended, normal bowel sounds Extremities: no cyanosis, clubbing, or edema Neurologic: alert & oriented X3, cranial nerves II-XII intact, strength grossly intact, sensation intact to light touch  Lab Results: Basic Metabolic Panel:  Lab 11/26/11 0932 11/25/11 0715  NA 140 140  K 3.8 4.1  CL 109 106  CO2 24 25  GLUCOSE 95 103*  BUN 20 23  CREATININE 0.86 0.99  CALCIUM 8.6 8.8  MG -- --  PHOS -- --   CBC:  Lab 11/26/11 0600 11/26/11 0005 11/25/11 0715  WBC 7.3 7.4 --  NEUTROABS -- -- 5.1  HGB 8.3* 8.6* --  HCT 24.9* 25.9* --    MCV 90.5 90.6 --  PLT 171 176 --   Cardiac Enzymes:  Lab 11/26/11 0600 11/25/11 1952 11/25/11 0715  CKTOTAL 21 33 29  CKMB 2.7 2.6 2.7  CKMBINDEX -- -- --  TROPONINI <0.30 <0.30 <0.30   Coagulation:  Lab 11/25/11 0715  LABPROT 13.8  INR 1.04    Medications: I have reviewed the patient's current medications. Scheduled Meds:   . amiodarone  200 mg Oral Daily  . amiodarone  200 mg Oral BID  . carvedilol  6.25 mg Oral BID  . docusate sodium  100 mg Oral BID  . furosemide  40 mg Oral Daily  . loteprednol  1 drop Right Eye QID  . pantoprazole  40 mg Oral Q1200  . rosuvastatin  10 mg Oral q1800  . sodium chloride  500 mL Intravenous Once  . spironolactone  12.5 mg Oral Daily  . Travoprost (BAK Free)  1 drop Right Eye QHS  . DISCONTD: amiodarone  400 mg Oral BID  . DISCONTD: amLODipine  5 mg Oral Daily  . DISCONTD: aspirin  81 mg Oral Daily  . DISCONTD: aspirin  81 mg Oral Daily  . DISCONTD: docusate sodium  100 mg Oral BID  . DISCONTD: losartan  50 mg Oral Daily  . DISCONTD: simvastatin  40 mg Oral QHS  . DISCONTD: travoprost (benzalkonium)  1 drop  Right Eye QHS   Continuous Infusions:   . sodium chloride     PRN Meds:.acetaminophen, acetaminophen, ondansetron (ZOFRAN) IV, ondansetron  Assessment/Plan: The patient is an 78 yo man, history of multiple GI bleeds, CAD s/p stent 11/14/11, critical aortic stenosis s/p balloon valvuloplasty 11/12/11, presenting with a 1-day history of BRBPR.  # BRBPR - patient presents with large-volume bright red blood during bowel movements, with bleeding between bowel movements.  Multiple prior admissions for GI bleeds, thought previously to be due to diverticulosis.  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  Bleeding is currently decreasing in quantity.  Patient does not want colonoscopy at this time, but is amenable to discussing this option if bleeding does not stop later today.   Last colonoscopy was in 2006.  Lab 11/26/11 0600 11/26/11 0005 11/25/11 0715  HGB 8.3* 8.6* 11.8*  -cbc's q4 -s/p 1 unit RBC's -IVF -hold aspirin, plavix -consider GI consult, based clinical course of BRBPR and patient's willingness to pursue colonoscopy -protonix -stool softener  # EKG changes - patient presents with new TWI's on EKG, in setting of CAD.  Patient currently reports no chest pain. -cycle CE's x3 -if patient develops chest pain or elevated troponins, consult cardiology  # Critical Aortic Stenosis - s/p balloon valvuloplasty 11/12/11.  Per conversation at last visit, patient does not want valve replacement. -symptomatic support  # CAD - s/p prior stents, most recently BMS x2 on 11/14/11.  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  Patient managed with home amiodarone, coreg. -continue amiodarone, coreg -consider cardiology consult if patient becomes symptomatic during this admission  # CHF - echo 11/02/11 shows EF = 30-35% -continue BP medications -strict I's/O's  # Glaucoma -chronic, stable -continue eye drops  # OSA - chronic, stable -continue CPAP  # Code Status - I had a long discussion with the patient this morning to clarify his code status.  The patient notes that if his heart stops, he does not want CPR or defibrillation.  If he is unable to adequately breathe, he would consent to a ventilator if we believed it would be a temporary process, but would not want to be on a ventilator long-term.  The patient's healthcare POA is [name] [phone], [phone])   LOS: 1 day   Eugene [name] 11/26/2011, 1:04 PM  Internal Medicine Teaching Service Attending Note Date: 11/26/2011  Patient name: Eugene [name]  Medical record number: [id number]  Date of birth: 05/25/1924    This patient has been seen and discussed  with the house staff. Please see their note for complete details. I concur with their findings with the following additions/corrections:if bleeding continues despite receiving RBC transfusion and temporarily holding anti-platelet therapy, we will consult GI for discussion and evaluation for colonoscopy.  Judyann Munson 11/26/2011, 4:30 PM

## 2011-11-26 NOTE — Progress Notes (Signed)
21:36 - 11/26/2011 - Placed patient on CPAP on a full face mask and humidity lije has wear at home.  Set CPAP at 10 cmH2O per RCP protocol.  Patient tolerating CPAP well.  Gypsy Decant, RRT, RCP

## 2011-11-26 NOTE — H&P (Signed)
Internal Medicine Teaching Service Attending Note Date: 11/26/2011  Patient name: Eugene [name]  Medical record number: [id number]  Date of birth: 1924/08/22   I have seen and evaluated Eugene [name] and discussed their care with the Residency Team.  As Dr. Larey Seat has described, Eugene [name] was recently hospitalized for his severe AS s/p valvuloplasty and PCI with BMS x 2. He was placed on dual antiplatelet therapy upon discharge to SNF for cardiac rehab, and PT for improving his overall exercise tolerance in setting of prolonged hospitalization. He is known to have occ. LGIB which resolves spontaneously, however, this time, he was noted to have numerous clots of blood in his stool for roughly a day in addition to having symptoms of dizziness. He was brought to the hospital from SNF for evaluation of GIB. ON admit, he was given IVF, access in order to receive RBC tsf, and his hct checked serially to assess rate of bleed, and needs for transfusion. On PE on admit, he did have evidence of LGIB. He has received 1 unit of RBC this morning since HCT <25. The patient states that he has not noticed any further bleeding since this morning. No longer feels dizzy.  Physical Exam: Blood pressure 103/46, pulse 77, temperature 97.6 F (36.4 C), temperature source Oral, resp. rate 15, height 5\' 7"  (1.702 m), weight 202 lb 2.6 oz (91.7 kg), SpO2 95.00%. General: alert, well-developed, A Xo by 3. In NAD, cooperative.  Head: normocephalic and atraumatic.  Eyes: vision grossly intact, pupils equal, pupils round, pupils reactive to light, no injection and anicteric. 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 Mild conjunctival pallor L eye, no conjunctival pallor R eye  Mouth: pharynx pink and moist, no erythema, and no exudates.  Neck: no JVD Lungs: normal respiratory effort, no accessory muscle use, CTAB, no c/w/r Heart: RRR 2/6 systolic murmur with possible extension into diastole, no gallop, and no rub.    Abdomen: soft, non-tender, normal bowel sounds, no distention, no guarding, no rebound tenderness  Pulses: 2+ DP/PT pulses bilaterally Extremities: 1+ pitting edema up to knees bilaterally. No cyanosis, clubbing. Neurologic: alert & oriented X3, cranial nerves II-XII intact, strength normal in all extremities. Skin: turgor normal and no rashes.   Lab results: Results for orders placed during the hospital encounter of 11/25/11 (from the past 24 hour(s))  CARDIAC PANEL(CRET KIN+CKTOT+MB+TROPI)     Status: Normal   Collection Time   11/25/11  7:52 PM      Component Value Range   Total CK 33  7 - 232 (U/L)   CK, MB 2.6  0.3 - 4.0 (ng/mL)   Troponin I <0.30  <0.30 (ng/mL)   Relative Index RELATIVE INDEX IS INVALID  0.0 - 2.5   MRSA PCR SCREENING     Status: Normal   Collection Time   11/25/11  8:18 PM      Component Value Range   MRSA by PCR NEGATIVE  NEGATIVE   OCCULT BLOOD X 1 CARD TO LAB, STOOL     Status: Normal   Collection Time   11/25/11  8:22 PM      Component Value Range   Fecal Occult Bld POSITIVE    CBC     Status: Abnormal   Collection Time   11/26/11 12:05 AM      Component Value Range   WBC 7.4  4.0 - 10.5 (K/uL)   RBC 2.86 (*) 4.22 - 5.81 (MIL/uL)   Hemoglobin 8.6 (*) 13.0 -  17.0 (g/dL)   HCT 40.9 (*) 81.1 - 52.0 (%)   MCV 90.6  78.0 - 100.0 (fL)   MCH 30.1  26.0 - 34.0 (pg)   MCHC 33.2  30.0 - 36.0 (g/dL)   RDW 91.4 (*) 78.2 - 15.5 (%)   Platelets 176  150 - 400 (K/uL)  PREPARE RBC (CROSSMATCH)     Status: Normal   Collection Time   11/26/11  3:30 AM      Component Value Range   Order Confirmation ORDER PROCESSED BY BLOOD BANK    TYPE AND SCREEN     Status: Normal (Preliminary result)   Collection Time   11/26/11  5:46 AM      Component Value Range   ABO/RH(D) O POS     Antibody Screen NEG     Sample Expiration 11/29/2011     Unit Number [id number]     Blood Component Type RED CELLS,LR     Unit division 00     Status of Unit ISSUED     Transfusion Status  OK TO TRANSFUSE     Crossmatch Result Compatible     Unit Number [id number]     Blood Component Type RED CELLS,LR     Unit division 00     Status of Unit ALLOCATED     Transfusion Status OK TO TRANSFUSE     Crossmatch Result Compatible    CARDIAC PANEL(CRET KIN+CKTOT+MB+TROPI)     Status: Normal   Collection Time   11/26/11  6:00 AM      Component Value Range   Total CK 21  7 - 232 (U/L)   CK, MB 2.7  0.3 - 4.0 (ng/mL)   Troponin I <0.30  <0.30 (ng/mL)   Relative Index RELATIVE INDEX IS INVALID  0.0 - 2.5   BASIC METABOLIC PANEL     Status: Abnormal   Collection Time   11/26/11  6:00 AM      Component Value Range   Sodium 140  135 - 145 (mEq/L)   Potassium 3.8  3.5 - 5.1 (mEq/L)   Chloride 109  96 - 112 (mEq/L)   CO2 24  19 - 32 (mEq/L)   Glucose, Bld 95  70 - 99 (mg/dL)   BUN 20  6 - 23 (mg/dL)   Creatinine, Ser 5.28  0.50 - 1.35 (mg/dL)   Calcium 8.6  8.4 - 41.3 (mg/dL)   GFR calc non Af Amer 77 (*) >90 (mL/min)   GFR calc Af Amer 89 (*) >90 (mL/min)  CBC     Status: Abnormal   Collection Time   11/26/11  6:00 AM      Component Value Range   WBC 7.3  4.0 - 10.5 (K/uL)   RBC 2.75 (*) 4.22 - 5.81 (MIL/uL)   Hemoglobin 8.3 (*) 13.0 - 17.0 (g/dL)   HCT 24.4 (*) 01.0 - 52.0 (%)   MCV 90.5  78.0 - 100.0 (fL)   MCH 30.2  26.0 - 34.0 (pg)   MCHC 33.3  30.0 - 36.0 (g/dL)   RDW 27.2 (*) 53.6 - 15.5 (%)   Platelets 171  150 - 400 (K/uL)  CBC     Status: Abnormal   Collection Time   11/26/11  1:07 PM      Component Value Range   WBC 12.1 (*) 4.0 - 10.5 (K/uL)   RBC 3.58 (*) 4.22 - 5.81 (MIL/uL)   Hemoglobin 10.6 (*) 13.0 - 17.0 (g/dL)   HCT 64.4 (*) 03.4 -  52.0 (%)   MCV 89.4  78.0 - 100.0 (fL)   MCH 29.6  26.0 - 34.0 (pg)   MCHC 33.1  30.0 - 36.0 (g/dL)   RDW 11.9 (*) 14.7 - 15.5 (%)   Platelets 197  150 - 400 (K/uL)  CARDIAC PANEL(CRET KIN+CKTOT+MB+TROPI)     Status: Normal   Collection Time   11/26/11  1:08 PM      Component Value Range   Total CK 33  7 - 232 (U/L)    CK, MB 2.9  0.3 - 4.0 (ng/mL)   Troponin I <0.30  <0.30 (ng/mL)   Relative Index RELATIVE INDEX IS INVALID  0.0 - 2.5     Imaging results:  No results found.  Assessment and Plan: I agree with the formulated Assessment and Plan that has been discussed by Dr. Larey Seat.

## 2011-11-27 ENCOUNTER — Encounter: Payer: Medicare Other | Admitting: Cardiovascular Disease

## 2011-11-27 LAB — CBC
HCT: 23.7 % — ABNORMAL LOW (ref 39.0–52.0)
Hemoglobin: 7.8 g/dL — ABNORMAL LOW (ref 13.0–17.0)
MCH: 29.5 pg (ref 26.0–34.0)
MCH: 29.3 pg (ref 26.0–34.0)
MCHC: 32.9 g/dL (ref 30.0–36.0)
MCHC: 32.9 g/dL (ref 30.0–36.0)
Platelets: 155 10*3/uL (ref 150–400)

## 2011-11-27 LAB — BASIC METABOLIC PANEL
BUN: 20 mg/dL (ref 6–23)
Calcium: 8.4 mg/dL (ref 8.4–10.5)
GFR calc non Af Amer: 66 mL/min — ABNORMAL LOW (ref >90)
Glucose, Bld: 105 mg/dL — ABNORMAL HIGH (ref 70–99)

## 2011-11-27 NOTE — Progress Notes (Signed)
Pt. Refused cpap for tonight. Pt. States he is going to try to do without it tonight.

## 2011-11-27 NOTE — Progress Notes (Signed)
Clinical Social Work-CSW completed full psychosocial assessment on 11/26/11-can be found in shadow chart. CSW met with pt, POA and MD at bedside to discuss hospital course as well as d/c planning. CSW has remained in contact with POA from previous admission due to loss of hearing aid during last hospitalization. CSW has referred to pt and pastor/POA to office of pt experience and will continue to follow for psychosocial support as well as d/c back to [facility]. Jodean Lima, [phone]

## 2011-11-27 NOTE — Progress Notes (Addendum)
Subjective: Overnight, nursing notes 2 further episodes of bright red blood with bowel movements.  Patient notes no BRBPR between bowel movements.  Patient feels less lightheaded today than yesterday.  Met with patient's POA today, and discussed patient's care, and risks/benefits of colonoscopy in the future.  Objective: Vital signs in last 24 hours: Filed Vitals:   11/27/11 0443 11/27/11 0500 11/27/11 0800 11/27/11 1200  BP: 126/54  135/66 143/64  Pulse:    52  Temp: 98.8 F (37.1 C)  98 F (36.7 C) 97.6 F (36.4 C)  TempSrc: Oral  Oral Oral  Resp: 15  15 17   Height:      Weight:  204 lb 5.9 oz (92.7 kg)    SpO2:    95%   Weight change: 10.6 oz (0.3 kg)  Intake/Output Summary (Last 24 hours) at 11/27/11 1504 Last data filed at 11/27/11 1300  Gross per 24 hour  Intake   1995 ml  Output   1175 ml  Net    820 ml   Physical Exam: General: alert, cooperative, and in no apparent distress HEENT: healing posterior head wound, pupils equal round and reactive to light, vision grossly intact, hearing somewhat impaired, oropharynx clear and non-erythematous  Neck: supple, no lymphadenopathy, grade II/VI systolic murmur Lungs: clear to ascultation bilaterally, normal work of respiration, no wheezes, rales, ronchi Heart: regular rate and rhythm Abdomen: soft, non-tender, non-distended, normal bowel sounds Extremities: no cyanosis, clubbing, or edema Neurologic: alert & oriented X3, cranial nerves II-XII intact, strength grossly intact, sensation intact to light touch  Lab Results: Basic Metabolic Panel:  Lab 11/27/11 1610 11/26/11 0600  NA 140 140  K 3.6 3.8  CL 110 109  CO2 23 24  GLUCOSE 105* 95  BUN 20 20  CREATININE 1.01 0.86  CALCIUM 8.4 8.6  MG -- --  PHOS -- --   CBC:  Lab 11/27/11 0510 11/26/11 2341 11/25/11 0715  WBC 6.6 9.3 --  NEUTROABS -- -- 5.1  HGB 7.8* 8.7* --  HCT 23.7* 26.2* --  MCV 89.8 88.2 --  PLT 146* 166 --   Cardiac Enzymes:  Lab 11/26/11  1308 11/26/11 0600 11/25/11 1952  CKTOTAL 33 21 33  CKMB 2.9 2.7 2.6  CKMBINDEX -- -- --  TROPONINI <0.30 <0.30 <0.30   Coagulation:  Lab 11/25/11 0715  LABPROT 13.8  INR 1.04    Medications: I have reviewed the patient's current medications. Scheduled Meds:    . amiodarone  200 mg Oral Daily  . amiodarone  200 mg Oral BID  . carvedilol  6.25 mg Oral BID  . docusate sodium  100 mg Oral BID  . furosemide  40 mg Oral Daily  . loteprednol  1 drop Right Eye QID  . pantoprazole  40 mg Oral Q1200  . rosuvastatin  10 mg Oral q1800  . spironolactone  12.5 mg Oral Daily  . Travoprost (BAK Free)  1 drop Right Eye QHS   Continuous Infusions:    . DISCONTD: sodium chloride 75 mL/hr at 11/27/11 0800   PRN Meds:.acetaminophen, acetaminophen, ondansetron (ZOFRAN) IV, ondansetron  Assessment/Plan: The patient is an 78 yo man, history of multiple GI bleeds, CAD s/p stent 11/14/11, critical aortic stenosis s/p balloon valvuloplasty 11/12/11, presenting with BRBPR.  # BRBPR - patient presents with bright red blood during bowel movements, with bleeding between bowel movements.  Multiple prior admissions for GI bleeds, thought previously to be due to diverticulosis.  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  Bleeding is currently decreasing in quantity.  Patient does not want colonoscopy at this time, but after long discussion with patient and healthcare POA, patient is amenable to colonoscopy if this happens again in the future.  Last colonoscopy was in 2006.  Lab 11/27/11 0510 11/26/11 2341 11/26/11 1655 11/26/11 1307 11/26/11 0600 11/26/11 0005 11/25/11 0715  HGB 7.8* 8.7* 8.6* 10.6* 8.3* 8.6* 11.8*  -cbc's q6-12 -s/p 1 unit RBC's on 2/18 -hold aspirin, plavix.  Plan to restart aspirin tomorrow if BRBPR stops, then plavix the next day. -protonix -stool softener  # Critical Aortic Stenosis - s/p balloon valvuloplasty 11/12/11.   Per conversation at last visit, patient does not want valve replacement. -symptomatic support  # CAD - s/p prior stents, most recently BMS x2 on 11/14/11.  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  Patient managed with home amiodarone, coreg. -continue amiodarone, coreg -consider cardiology consult if patient becomes symptomatic during this admission  # CHF - echo 11/02/11 shows EF = 30-35% -continue BP medications -strict I's/O's  # Glaucoma -chronic, stable -continue eye drops  # OSA - chronic, stable -continue CPAP  # Code Status - I had a long discussion with the patient yesterday morning to clarify his code status.  The patient notes that if his heart stops, he does not want CPR or defibrillation.  If he is unable to adequately breathe, he would consent to a ventilator if we believed it would be a temporary process, but would not want to be on a ventilator long-term.  The patient's healthcare POA is [name] [phone], [phone])   LOS: 2 days   [name] 11/27/2011, 3:04 PM  Internal Medicine Teaching Service Attending Note Date: 11/27/2011  Patient name: Eugene [name]  Medical record number: [id number]  Date of birth: [birth year]    This patient has been seen and discussed with Dr. Manson Passey. Please see his note for complete details. I concur with his findings with the following additions/corrections: will continue to monitor for GI bleeding and serial hct q 6hr to assess if he needs addn transfusion. Continue to monitor on telemetry.  Judyann Munson 11/27/2011, 4:02 PM

## 2011-11-28 LAB — CBC
Hemoglobin: 8 g/dL — ABNORMAL LOW (ref 13.0–17.0)
Hemoglobin: 8.4 g/dL — ABNORMAL LOW (ref 13.0–17.0)
MCH: 30.1 pg (ref 26.0–34.0)
MCHC: 32.9 g/dL (ref 30.0–36.0)
Platelets: 146 10*3/uL — ABNORMAL LOW (ref 150–400)
RBC: 2.7 MIL/uL — ABNORMAL LOW (ref 4.22–5.81)
RBC: 2.79 MIL/uL — ABNORMAL LOW (ref 4.22–5.81)
RDW: 17.1 % — ABNORMAL HIGH (ref 11.5–15.5)
WBC: 7.1 10*3/uL (ref 4.0–10.5)
WBC: 7.8 10*3/uL (ref 4.0–10.5)

## 2011-11-28 LAB — BASIC METABOLIC PANEL
CO2: 22 mEq/L (ref 19–32)
GFR calc non Af Amer: 73 mL/min — ABNORMAL LOW (ref >90)
Glucose, Bld: 104 mg/dL — ABNORMAL HIGH (ref 70–99)
Potassium: 4 mEq/L (ref 3.5–5.1)
Sodium: 138 mEq/L (ref 135–145)

## 2011-11-28 MED ORDER — ASPIRIN EC 81 MG PO TBEC
81.0000 mg | DELAYED_RELEASE_TABLET | Freq: Every day | ORAL | Status: DC
  Administered 2011-11-28 – 2011-11-29 (×2): 81 mg via ORAL
  Filled 2011-11-28 (×2): qty 1

## 2011-11-28 MED ORDER — SODIUM CHLORIDE 0.9 % IJ SOLN
3.0000 mL | Freq: Two times a day (BID) | INTRAMUSCULAR | Status: DC
  Administered 2011-11-28 – 2011-11-29 (×2): 3 mL via INTRAVENOUS

## 2011-11-28 NOTE — Progress Notes (Signed)
Patient refused cpap tonight. RT will continue to monitor. 

## 2011-11-28 NOTE — Progress Notes (Signed)
Pt going to [id number] via w/c with belongings. No complaints voiced at this time. Eugene [name]

## 2011-11-28 NOTE — Progress Notes (Addendum)
Subjective: No further episodes of BRBPR overnight.  Hemoglobin has stabilized over the last 24 hours.  Patient notes no further lightheadedness.  Objective: Vital signs in last 24 hours: Filed Vitals:   11/28/11 0400 11/28/11 0444 11/28/11 0700 11/28/11 0815  BP:  124/54  159/75  Pulse: 59 51  59  Temp:  97.5 F (36.4 C)  97.4 F (36.3 C)  TempSrc:  Oral  Oral  Resp: 16 14  22   Height:      Weight:   203 lb 4.2 oz (92.2 kg)   SpO2: 96% 94%  97%   Weight change: -1 lb 1.6 oz (-0.5 kg)  Intake/Output Summary (Last 24 hours) at 11/28/11 1002 Last data filed at 11/28/11 0800  Gross per 24 hour  Intake   1320 ml  Output   2150 ml  Net   -830 ml   Physical Exam: General: alert, cooperative, and in no apparent distress HEENT: healing posterior head wound, pupils equal round and reactive to light, vision grossly intact, hearing somewhat impaired, oropharynx clear and non-erythematous  Neck: supple, no lymphadenopathy, grade II/VI systolic murmur Lungs: clear to ascultation bilaterally, normal work of respiration, no wheezes, rales, ronchi Heart: regular rate and rhythm Abdomen: soft, non-tender, non-distended, normal bowel sounds Extremities: no cyanosis, clubbing, or edema Neurologic: alert & oriented X3, cranial nerves II-XII intact, strength grossly intact, sensation intact to light touch  Lab Results: Basic Metabolic Panel:  Lab 11/28/11 1610 11/27/11 0510  NA 138 140  K 4.0 3.6  CL 107 110  CO2 22 23  GLUCOSE 104* 105*  BUN 16 20  CREATININE 0.96 1.01  CALCIUM 8.5 8.4  MG -- --  PHOS -- --   CBC:  Lab 11/28/11 0345 11/28/11 0003 11/25/11 0715  WBC 6.5 7.1 --  NEUTROABS -- -- 5.1  HGB 8.0* 7.8* --  HCT 24.3* 23.4* --  MCV 90.0 89.0 --  PLT 136* 146* --   Cardiac Enzymes:  Lab 11/26/11 1308 11/26/11 0600 11/25/11 1952  CKTOTAL 33 21 33  CKMB 2.9 2.7 2.6  CKMBINDEX -- -- --  TROPONINI <0.30 <0.30 <0.30   Coagulation:  Lab 11/25/11 0715  LABPROT 13.8   INR 1.04    Medications: I have reviewed the patient's current medications. Scheduled Meds:    . amiodarone  200 mg Oral Daily  . amiodarone  200 mg Oral BID  . aspirin EC  81 mg Oral Daily  . carvedilol  6.25 mg Oral BID  . docusate sodium  100 mg Oral BID  . furosemide  40 mg Oral Daily  . loteprednol  1 drop Right Eye QID  . pantoprazole  40 mg Oral Q1200  . rosuvastatin  10 mg Oral q1800  . spironolactone  12.5 mg Oral Daily  . Travoprost (BAK Free)  1 drop Right Eye QHS   Continuous Infusions:   PRN Meds:.acetaminophen, acetaminophen, ondansetron (ZOFRAN) IV, ondansetron  Assessment/Plan: The patient is an 78 yo man, history of multiple GI bleeds, CAD s/p stent 11/14/11, critical aortic stenosis s/p balloon valvuloplasty 11/12/11, presenting with BRBPR.  # BRBPR - patient presents with BRBPR, with multiple prior admissions for GI bleeds, thought previously to be due to diverticulosis.  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  Bleeding has now subsided.  Patient does not want colonoscopy at this time, but  is amenable to colonoscopy if this happens again in the future.  Last colonoscopy was in  2006.  Lab 11/28/11 0345 11/28/11 0003 11/27/11 1455 11/27/11 0510 11/26/11 2341 11/26/11 1655 11/26/11 1307 11/26/11 0600  HGB 8.0* 7.8* 7.9* 7.8* 8.7* 8.6* 10.6* 8.3*  -cbc's 12 -s/p 1 unit RBC's on 2/18 -re-start aspirin today, plan to re-start plavix tomorrow -protonix -stool softener -PT/OT consult to re-eval functional status after GI bleed  # Critical Aortic Stenosis - s/p balloon valvuloplasty 11/12/11.  Per conversation at last visit, patient does not want valve replacement. -symptomatic support  # CAD - s/p prior stents, most recently BMS x2 on 11/14/11.  Currently no chest pain -resume aspirin today, plan to restart plavix tomorrow if Hb remains stable  # Tachy/Brady syndrome - patient has recurrent episodes of  asymptomatic tachycardia/bradicardia.  Patient managed with home amiodarone, coreg. -continue amiodarone, coreg -consider cardiology consult if patient becomes symptomatic during this admission  # CHF - echo 11/02/11 shows EF = 30-35% -continue BP medications -strict I's/O's  # Glaucoma -chronic, stable -continue eye drops  # OSA - chronic, stable -continue CPAP  # Code Status - The patient notes that if his heart stops, he does not want CPR or defibrillation.  If he is unable to adequately breathe, he would consent to a ventilator if we believed it would be a temporary process, but would not want to be on a ventilator long-term.  The patient's healthcare POA is [name] [phone], [phone])  # Dispo - transfer to telemetry today, plan for d/c back to SNF tomorrow.   LOS: 3 days   [name] 11/28/2011, 10:02 AM  Internal Medicine Teaching Service Attending Note Date: 11/28/2011  Patient name: Eugene [name]  Medical record number: [id number]  Date of birth: [birth year]    This patient has been seen and discussed with the house staff. Please see their note for complete details. I concur with their findings with the following additions/corrections: will see if CBC is stable in the morning to decide if needs addn blood transfusion.  [name] 11/28/2011, 11:00 PM

## 2011-11-29 ENCOUNTER — Encounter (HOSPITAL_COMMUNITY): Payer: Self-pay | Admitting: Internal Medicine

## 2011-11-29 LAB — BASIC METABOLIC PANEL
BUN: 17 mg/dL (ref 6–23)
Calcium: 9 mg/dL (ref 8.4–10.5)
Creatinine, Ser: 0.94 mg/dL (ref 0.50–1.35)
GFR calc non Af Amer: 74 mL/min — ABNORMAL LOW (ref >90)
Glucose, Bld: 97 mg/dL (ref 70–99)

## 2011-11-29 LAB — CBC
HCT: 24.9 % — ABNORMAL LOW (ref 39.0–52.0)
Hemoglobin: 8.1 g/dL — ABNORMAL LOW (ref 13.0–17.0)
MCH: 29.2 pg (ref 26.0–34.0)
MCHC: 32.5 g/dL (ref 30.0–36.0)

## 2011-11-29 MED ORDER — ROSUVASTATIN CALCIUM 10 MG PO TABS: 10.0000 mg | ORAL_TABLET | Freq: Every day | ORAL | Status: DC

## 2011-11-29 MED ORDER — FERROUS SULFATE 325 (65 FE) MG PO TABS: 325.0000 mg | ORAL_TABLET | Freq: Every day | ORAL | Status: DC

## 2011-11-29 MED ORDER — CLOPIDOGREL BISULFATE 75 MG PO TABS
75.0000 mg | ORAL_TABLET | Freq: Every day | ORAL | Status: DC
  Administered 2011-11-29: 75 mg via ORAL
  Filled 2011-11-29 (×2): qty 1

## 2011-11-29 NOTE — Discharge Instructions (Signed)
Gastrointestinal Bleeding Gastrointestinal (GI) bleeding is bleeding from the gut or any place between your mouth and anus. If bleeding is slow, you may be allowed to go home. If there is a lot of bleeding, hospitalization and observation are often required. SYMPTOMS   You vomit bright red blood or material that looks like coffee grounds.   You have blood in your stools or the stools look black and tarry.  DIAGNOSIS  Your caregiver may diagnose your condition by taking a history and a physical exam. More tests may be needed, including:  X-rays.   EGD (esophagogastroduodenoscopy), which looks at your esophagus, stomach, and small bowel through a flexible telescope-like instrument.   Colonoscopy, which looks at your colon/large bowel through a flexible telescope-like instrument.   Biopsies, which remove a small sample of tissue to examine under a microscope.  Finding out the results of your test Not all test results are available during your visit. If your test results are not back during the visit, make an appointment with your caregiver to find out the results. Do not assume everything is normal if you have not heard from your caregiver or the medical facility. It is important for you to follow up on all of your test results. HOME CARE INSTRUCTIONS   Follow instructions as suggested by your caregiver regarding medicines. Do not take aspirin, drink alcohol, or take medicines for pain and arthritis unless your caregiver says it is okay.   Get the suggested follow-up care when the tests are done.  SEEK IMMEDIATE MEDICAL CARE IF:   Your bleeding increases or you become lightheaded, weak, or pass out (faint).   You experience severe cramps in your stomach, back, or belly (abdomen).   You pass large clots.   The problems which brought you in for medical care get worse.  MAKE SURE YOU:   Understand these instructions.   Will watch your condition.   Will get help right away if you are  not doing well or get worse.  Document Released: 09/21/2000 Document Revised: 06/06/2011 Document Reviewed: 05/13/2008 [employer] Patient Information 2012 [employer] 

## 2011-11-29 NOTE — Progress Notes (Signed)
Subjective: No episodes of BRBPR overnight.  Patient notes no chest pain, SOB, or lightheadedness this morning.  Patient wants to go back to SNF.  Objective: Vital signs in last 24 hours: Filed Vitals:   11/28/11 1159 11/28/11 1200 11/28/11 2105 11/29/11 0434  BP: 144/69 176/90 165/86 133/54  Pulse: 58 63 97 60  Temp: 98.4 F (36.9 C) 97.2 F (36.2 C) 97.8 F (36.6 C) 98 F (36.7 C)  TempSrc: Oral Oral Oral Oral  Resp: 19 18 18 18   Height:  5\' 7"  (1.702 m)    Weight:  201 lb 4.8 oz (91.309 kg)  198 lb 11.2 oz (90.13 kg)  SpO2: 97% 98% 98% 99%   Weight change: -1 lb 15.4 oz (-0.891 kg)  Intake/Output Summary (Last 24 hours) at 11/29/11 0754 Last data filed at 11/29/11 0656  Gross per 24 hour  Intake   1440 ml  Output   1550 ml  Net   -110 ml   Physical Exam: General: alert, cooperative, and in no apparent distress HEENT: healing posterior head wound, pupils equal round and reactive to light, vision grossly intact, hearing somewhat impaired, oropharynx clear and non-erythematous  Neck: supple, no lymphadenopathy, grade II/VI systolic murmur Lungs: clear to ascultation bilaterally, normal work of respiration, no wheezes, rales, ronchi Heart: regular rate and rhythm Abdomen: soft, non-tender, non-distended, normal bowel sounds Extremities: no cyanosis, clubbing, or edema Neurologic: alert & oriented X3, cranial nerves II-XII intact, strength grossly intact, sensation intact to light touch  Lab Results: Basic Metabolic Panel:  Lab 11/28/11 8295 11/27/11 0510  NA 138 140  K 4.0 3.6  CL 107 110  CO2 22 23  GLUCOSE 104* 105*  BUN 16 20  CREATININE 0.96 1.01  CALCIUM 8.5 8.4  MG -- --  PHOS -- --   CBC:  Lab 11/29/11 0632 11/28/11 1629 11/25/11 0715  WBC 7.0 7.8 --  NEUTROABS -- -- 5.1  HGB 8.1* 8.4* --  HCT 24.9* 24.8* --  MCV 89.9 88.9 --  PLT 171 161 --   Cardiac Enzymes:  Lab 11/26/11 1308 11/26/11 0600 11/25/11 1952  CKTOTAL 33 21 33  CKMB 2.9 2.7 2.6    CKMBINDEX -- -- --  TROPONINI <0.30 <0.30 <0.30   Coagulation:  Lab 11/25/11 0715  LABPROT 13.8  INR 1.04    Medications: I have reviewed the patient's current medications. Scheduled Meds:    . amiodarone  200 mg Oral Daily  . amiodarone  200 mg Oral BID  . aspirin EC  81 mg Oral Daily  . carvedilol  6.25 mg Oral BID  . docusate sodium  100 mg Oral BID  . furosemide  40 mg Oral Daily  . loteprednol  1 drop Right Eye QID  . pantoprazole  40 mg Oral Q1200  . rosuvastatin  10 mg Oral q1800  . sodium chloride  3 mL Intravenous Q12H  . sodium chloride  3 mL Intravenous Q12H  . spironolactone  12.5 mg Oral Daily  . Travoprost (BAK Free)  1 drop Right Eye QHS   Continuous Infusions:   PRN Meds:.acetaminophen, acetaminophen, ondansetron (ZOFRAN) IV, ondansetron  Assessment/Plan: The patient is an 78 yo man, history of multiple GI bleeds, CAD s/p stent 11/14/11, critical aortic stenosis s/p balloon valvuloplasty 11/12/11, presenting with BRBPR, now resolved.  # BRBPR - patient presents with BRBPR, with multiple prior admissions for GI bleeds, thought previously to be due to diverticulosis.  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  Bleeding has now subsided.  Patient does not want colonoscopy at this time, but  is amenable to colonoscopy if this happens again in the future.  Last colonoscopy was in 2006.  Lab 11/29/11 1610 11/28/11 1629 11/28/11 0345 11/28/11 0003 11/27/11 1455 11/27/11 0510 11/26/11 2341 11/26/11 1655  HGB 8.1* 8.4* 8.0* 7.8* 7.9* 7.8* 8.7* 8.6*  -cbc's 12 -s/p 1 unit RBC's on 2/18 -restarted aspirin yesterday, will restart plavix today -protonix -stool softener -PT/OT consult to re-eval functional status after GI bleed  # Critical Aortic Stenosis - s/p balloon valvuloplasty 11/12/11.  Per conversation at last visit, patient does not want valve replacement. -symptomatic support  # CAD - s/p prior  stents, most recently BMS x2 on 11/14/11.  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  Patient managed with home amiodarone, coreg. -continue amiodarone, coreg -consider cardiology consult if patient becomes symptomatic during this admission  # CHF - echo 11/02/11 shows EF = 30-35% -continue BP medications -strict I's/O's  # Glaucoma -chronic, stable -continue eye drops  # OSA - chronic, stable -continue CPAP  # Code Status - The patient notes that if his heart stops, he does not want CPR or defibrillation.  If he is unable to adequately breathe, he would consent to a ventilator if we believed it would be a temporary process, but would not want to be on a ventilator long-term.  The patient's healthcare POA is [name] [phone], [phone])  # Dispo - d/c to SNF today   LOS: 4 days   [name] 11/29/2011, 7:54 AM

## 2011-11-29 NOTE — Discharge Summary (Signed)
Internal Medicine Teaching [facility] Discharge Note  Name: Eugene [name] MRN: [id number] DOB: April 17, 1924 78 y.o.  Date of Admission: 11/25/2011  6:57 AM Date of Discharge: 11/29/2011 Attending Physician: Judyann Munson, MD  Discharge Diagnosis: 1. Bright red blood per rectum - GI bleed likely due to diverticulosis vs AVM, no colonoscopy, self-resolved 2. CHF - TTE 11/02/11 shows EF 30-35% 3. CAD - s/p BMS x2 to LAD 11/14/11, held aspirin (2 days) and plavix (3 days) due to bleeding 4. Critical aortic stenosis - s/p aortic balloon valvuloplasty 11/12/11 5. Supraventricular tachycardia - chronic, stable 6. Hypertension 7. Hyperlipidemia - changed simvastatin to Crestor due to interaction with amiodarone  Discharge Medications: Medication List  As of 11/29/2011 11:27 AM   STOP taking these medications         amiodarone 400 MG tablet      simvastatin 40 MG tablet         TAKE these medications         amiodarone 200 MG tablet   Commonly known as: PACERONE   Take 1 tablet (200 mg total) by mouth daily.   Start taking on: 12/01/2011      amLODipine 5 MG tablet   Commonly known as: NORVASC   Take 1 tablet (5 mg total) by mouth daily.      aspirin 81 MG tablet   Take 81 mg by mouth daily.      carvedilol 6.25 MG tablet   Commonly known as: COREG   Take 1 tablet (6.25 mg total) by mouth 2 (two) times daily.      clopidogrel 75 MG tablet   Commonly known as: PLAVIX   Take 1 tablet (75 mg total) by mouth daily with breakfast.      docusate sodium 100 MG capsule   Commonly known as: COLACE   Take 100 mg by mouth 2 (two) times daily.      ferrous sulfate 325 (65 FE) MG tablet   Take 1 tablet (325 mg total) by mouth daily with breakfast.      furosemide 20 MG tablet   Commonly known as: LASIX   Take 2 tablets (40 mg total) by mouth daily.      losartan 50 MG tablet   Commonly known as: COZAAR   Take 50 mg by mouth daily.      loteprednol 0.5 % ophthalmic suspension     Commonly known as: LOTEMAX   Place 1 drop into the right eye 4 (four) times daily.      pantoprazole 40 MG tablet   Commonly known as: PROTONIX   Take 1 tablet (40 mg total) by mouth daily at 12 noon.      rosuvastatin 10 MG tablet   Commonly known as: CRESTOR   Take 1 tablet (10 mg total) by mouth daily at 6 PM.      spironolactone 12.5 mg Tabs   Commonly known as: ALDACTONE   Take 0.5 tablets (12.5 mg total) by mouth daily.      travoprost (benzalkonium) 0.004 % ophthalmic solution   Commonly known as: TRAVATAN   Place 1 drop into the right eye at bedtime.            Disposition and follow-up:   Mr.Ananda N Dahir was discharged from [facility] to a Skilled Nursing Facility on 11/29/11 in stable and improved condition, with resolution of GI bleeding.  The patient will follow-up with Dr. Baltazar Apo 12/07/11, who will check a cbc to  ensure appropriate rise of hemoglobin s/p GI bleed.  The patient will also follow-up with Tereso Newcomer of [facility] to follow-up on his CAD and aortic stenosis.  Follow-up Appointments: Discharge Orders    Future Appointments: Provider: Department: Dept Phone: Center:   12/07/2011 10:15 AM Melida Quitter, MD [facility] [phone] [facility]   12/11/2011 11:10 AM Beatrice Lecher, PA [facility] [phone] [facility]     Future Orders Please Complete By Expires   Diet - low sodium heart healthy      Increase activity slowly      Discharge instructions      Comments:   You were hospitalized for a GI bleed, likely due to diverticulosis or arterio-venous malformation.  The bleed has now stopped, but your blood levels are lower than when you came to the hospital due to the bleeding.  For the next 2 months, take 1 iron pill daily to help replenish your blood stores.  This medication can cause constipation, so be sure to take a stool softener (like Colace) daily.  Please follow-up with your physicians as indicated on the discharge  instructions.   Call MD for:      Comments:   Uncontrolled rectal bleeding, chest pain, shortness of breath, loss of consciousness.   Call MD for:  persistant dizziness or light-headedness         Consultations: None  Procedures Performed:  Dg Chest 2 View  11/11/2011  *RADIOLOGY REPORT*  Clinical Data: Increased cough.  CHEST - 2 VIEW  Comparison: Two-view chest 11/04/2011.  Findings: The heart is enlarged.  There is no edema or effusion to suggest failure.  No focal airspace disease is evident. Atherosclerotic calcifications are present within the aorta.  The degenerative changes are again noted within the thoracic spine.  IMPRESSION:  1.  Stable cardiomegaly without failure. 2.  No acute cardiopulmonary disease or significant interval change.  Original Report Authenticated By: Jamesetta Orleans. MATTERN, M.D.   Dg Chest 2 View  11/04/2011  *RADIOLOGY REPORT*  Clinical Data: Cough, short of breath  CHEST - 2 VIEW  Comparison: Chest radiograph 10/31/2011  Findings: Stable enlarged cardiac silhouette.  There is a left lower lobe atelectasis.  There is central venous pulmonary congestion.  No evidence of focal consolidation.  No pneumothorax.  IMPRESSION:  1.  Cardiomegaly and  left lower lobe atelectasis is similar prior. 2.  Central venous pulmonary congestion is similar to prior.  Original Report Authenticated By: Genevive Bi, M.D.   Dg Chest 2 View  10/31/2011  *RADIOLOGY REPORT*  Clinical Data: Status post fall with a blow to the back of the head.  CHEST - 2 VIEW  Comparison: Plain films of the chest 04/13/2011.  Findings: There is cardiomegaly with some pulmonary vascular congestion.  No focal airspace disease or pleural effusion.  No pneumothorax.  Remote right humerus fracture noted.  IMPRESSION: Cardiomegaly and vascular congestion without acute disease.  Original Report Authenticated By: Bernadene Bell. Maricela Curet, M.D.   Ct Head Wo Contrast  10/31/2011  *RADIOLOGY REPORT*  Clinical Data: Fall  going up steps.  Amnestic of the incident. Abrasion between eyebrows.  The large hematoma in the right parietal scalp.  CT HEAD WITHOUT CONTRAST  Technique:  Contiguous axial images were obtained from the base of the skull through the vertex without contrast.  Comparison: CT head without contrast 09/12/2009.  Findings: A large right parietal scalp hematoma is present. Although there is some motion in this area, there is no  underlying fracture. Minimal soft tissue swelling is noted of the glabella. There is no underlying fracture.  No other significant extracranial injury is evident.  The study is mildly degraded by motion.  Atrophy and moderate white matter change is stable.  No acute cortical infarct, hemorrhage, or mass lesion is present.  Dolichoectasia of the basilar artery is stable.  The ventricles are proportionate to the degree of atrophy. No significant extra-axial fluid collection is present.  The paranasal sinuses and mastoid air cells are clear.  A metallic scleral band of the lateral aspect of the right globe is stable.  IMPRESSION:  1.  The large right parietal scalp hematoma without underlying fracture. 2.  Minimal soft tissue swelling above the glabella without underlying fracture. 3.  Stable atrophy and white matter disease. 4.  No acute intracranial abnormality. 5.  Atherosclerosis.  Original Report Authenticated By: Jamesetta Orleans. MATTERN, M.D.    Admission HPI:  Mr. Gorum is an 78 year old man known to our service with history of multiple GI bleeds, 3 vessel CAD, aortic stenosis, HTN, CHF who presents with gross red blood and clots in his stools since 1:30am this morning. He has had roughly 6 stools today. The first was very hard and painful, but the subsequent stools have been normal texture and fairly painless. He feels a little weak now and a little lightheaded. He comes from a SNF where he was getting rehab from recent hospitalization.  He was recently hospitalized in the past month for  syncope due to aortic stenosis. He underwent balloon valvuloplasty and PCI stenting of LAD. He was placed on Plavix in addition to his Aspirin after stent was placed on 11/14/11. Instructions were to take dual antiplatelet therapy for at least 30 days, and he has outpatient fu appt in two days 11/27/11 with interventional cardiology.  He gives a history of multiple GI bleeds. He says that they happen about every six months. Last admission for GI bleed was 01/2011 for GI bleed thought to be due to diverticulosis. Patient thinks he had colonoscopy that admission but there is no record of one done in [facility]. Discharge summary mentions a 2006 colonoscopy found diverticulosis, but official report for this cannot be found in [facility].  No fevers or chills. No abd pain, CP, SOB, diarrhea, nausea, vomiting. No dysuria or hematuria. No numbness or tingling anywhere.  Admission Physical Exam Blood pressure 147/73, pulse 68, temperature 98.2 F (36.8 C), temperature source Oral, resp. rate 17, height 5\' 7"  (1.702 m), weight 203 lb 11.3 oz (92.4 kg), SpO2 98.00%.  General: alert, well-developed, and cooperative to examination.  Head: normocephalic and atraumatic.  Eyes: vision grossly intact, pupils equal, pupils round, pupils reactive to light, no injection and anicteric. 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 Mild conjunctival pallor L eye, no conjunctival pallor R eye (postsurgical eye). Mouth: pharynx pink and moist, no erythema, and no exudates.  Neck: no JVD Lungs: normal respiratory effort, no accessory muscle use, normal breath sounds, no crackles, and no wheezes. Heart: normal rate, regular rhythm, 2/6 systolic murmur with possible extension into diastole, no gallop, and no rub.  Abdomen: soft, non-tender, normal bowel sounds, no distention, no guarding, no rebound tenderness  Rectal: Good tone. Non-bleeding external Hemorrhoids. Gross red blood and clots present. Pulses: 2+ DP/PT pulses  bilaterally Extremities: 1+ pitting edema up to knees bilaterally. No cyanosis, clubbing. Neurologic: alert & oriented X3, cranial nerves II-XII intact, strength normal in all extremities. Skin: turgor normal and no  rashes.   Admission Labs Na: 140, K: 4.1, Cl: 106, CO2: 25, BUN: 23, Cr: 0.99, Glucose: 103 WBC: 7.7, Hb: 11.8, HCT: 35.7, Plt: 221  Hospital Course by problem list: 1. GI bleed - The patient has a history of GI bleeds every 6-18 months, and presents with bright red blood per rectum.  At last hospital discharge 2/8, Hb = 12.4.  The patient was admitted, and hemoglobin fell from 11.8 on admission to 8.3.  The patient was transfused 1 unit of pRBC, and later stabilized at a hemoglobin around 8.0 for the 36 hours prior to discharge.  The patient's aspirin was held for 2 days, and plavix was held for 3 days, during this episode of active bleeding.  The patient last had a colonoscopy in 2006, demonstrating diverticulosis.  After a long conversation, the patient decided he did not want a colonoscopy at this time as the risks outweighed the benefits.  However, later in his hospitalization, the patient changed his mind, and decided that he would in fact be amenable to pursuing colonoscopy during future admissions for GI bleed.  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  The patient was discharged on iron supplementation and a stool softener, and will be followed-up as an outpatient.  The patient noted no lightheadedness or dizziness with ambulation at discharge.  2. Hyperlipidemia - the patient has a history of hyperlipidemia, on simvastatin.  The patient was recently started on amiodarone during previous admission, so out of concern for medication interaction, simvastatin was changed to Crestor.  3. CAD - s/p BMS placement x3 during last admission.  Please see d/c summary from 11/16/11 for complete details.  4. Aortic stenosis - s/p balloon  valvuloplasty 11/12/11, please see d/c summary from 11/16/11 for complete details.  5. Supraventricular tachycardia - the patient has a history of episodes of tachycardia/bradycardia, which continued throughout this hospitalization.  The patient was started on amiodarone during his last admission, and this medication was continued throughout his current hospitalization, and at discharge.  Time spent on discharge: 45 minutes  Discharge Vitals:  BP 133/54  Pulse 60  Temp(Src) 98 F (36.7 C) (Oral)  Resp 18  Ht 5\' 7"  (1.702 m)  Wt 198 lb 11.2 oz (90.13 kg)  BMI 31.12 kg/m2  SpO2 99%  Discharge Labs:  Results for orders placed during the hospital encounter of 11/25/11 (from the past 24 hour(s))  CBC     Status: Abnormal   Collection Time   11/28/11  4:29 PM      Component Value Range   WBC 7.8  4.0 - 10.5 (K/uL)   RBC 2.79 (*) 4.22 - 5.81 (MIL/uL)   Hemoglobin 8.4 (*) 13.0 - 17.0 (g/dL)   HCT 46.9 (*) 62.9 - 52.0 (%)   MCV 88.9  78.0 - 100.0 (fL)   MCH 30.1  26.0 - 34.0 (pg)   MCHC 33.9  30.0 - 36.0 (g/dL)   RDW 52.8 (*) 41.3 - 15.5 (%)   Platelets 161  150 - 400 (K/uL)  CBC     Status: Abnormal   Collection Time   11/29/11  6:32 AM      Component Value Range   WBC 7.0  4.0 - 10.5 (K/uL)   RBC 2.77 (*) 4.22 - 5.81 (MIL/uL)   Hemoglobin 8.1 (*) 13.0 - 17.0 (g/dL)   HCT 24.4 (*) 01.0 - 52.0 (%)   MCV 89.9  78.0 - 100.0 (fL)   MCH 29.2  26.0 - 34.0 (pg)   MCHC 32.5  30.0 - 36.0 (g/dL)   RDW 78.2 (*) 95.6 - 15.5 (%)   Platelets 171  150 - 400 (K/uL)  BASIC METABOLIC PANEL     Status: Abnormal   Collection Time   11/29/11  6:32 AM      Component Value Range   Sodium 140  135 - 145 (mEq/L)   Potassium 4.1  3.5 - 5.1 (mEq/L)   Chloride 108  96 - 112 (mEq/L)   CO2 23  19 - 32 (mEq/L)   Glucose, Bld 97  70 - 99 (mg/dL)   BUN 17  6 - 23 (mg/dL)   Creatinine, Ser 2.13  0.50 - 1.35 (mg/dL)   Calcium 9.0  8.4 - 08.6 (mg/dL)   GFR calc non Af Amer 74 (*) >90 (mL/min)   GFR calc Af  Amer 86 (*) >90 (mL/min)    Signed: [name] 11/29/2011, 11:27 AM

## 2011-11-29 NOTE — Progress Notes (Signed)
  OT spoke with patient about role of OT.  Pt stated he was going back to SNF today.  OT will defer OT eval back to SNF. Pt in agreement. Lise Auer, OT

## 2011-11-29 NOTE — Evaluation (Signed)
Jonthan Leite Ingold,PT Acute Rehabilitation [phone] (pager)  

## 2011-11-29 NOTE — Progress Notes (Signed)
Clinical Social Work-CSW facilitated pt d/c to [facility] with family friend providing transportation. CSW arranged d/c packet and notified pt POA of d/c-No further needs at this time-Joshuah Minella-MSW, [phone]

## 2011-11-29 NOTE — Evaluation (Signed)
Physical Therapy Evaluation Patient Details Name: DWAYNE BULKLEY MRN: [id number] DOB: March 19, 1924 Today's Date: 11/29/2011  Problem List:  Patient Active Problem List  Diagnoses  . HYPERLIPIDEMIA  . MACULAR DEGENERATION, RIGHT EYE  . GLAUCOMA, RIGHT EYE  . Essential hypertension, benign  . ALLERGIC RHINITIS DUE TO POLLEN  . DIVERTICULOSIS, COLON  . Chronic venous insufficiency  . Congestive heart failure  . Bradycardia, drug induced  . Blood in stool, frank  . SVT (supraventricular tachycardia)  . CAD (coronary artery disease)  . Shortness of breath  . Preventative health care  . Dizziness  . Syncope and collapse  . Aortic stenosis  . GI bleed    Past Medical History:  Past Medical History  Diagnosis Date  . Glaucoma     Right eye  . Diverticulosis   . Macular degeneration of right eye   . Allergic rhinitis   . CAD (coronary artery disease)     status post percutaneous transluminal coronary angioplasty in 1991.  . OSA on CPAP   . Hypertension   . Hyperlipidemia   . Chronic CHF (congestive heart failure)     ejection fraction of 40-45%, grade 2 diastolic  dysfunction, and mild aortic stenosis  . Supraventricular tachycardia     2 syndromes-nonsustained atrial tachycardia//adenosine responsive diuretic positive reentry probably AV node reentry  . Shortness of breath   . Sleep apnea     uses cpap  . 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  Patient amenable to repeat colonoscopy if needed on future admissions.   Past Surgical History:  Past Surgical History  Procedure Date  . Implantation of baerveldt glaucoma device lant with scleral reinforcement using tutoplast tissue graft right eye.   . Coronary angioplasty with stent placement   . Corneal transplant     PT Assessment/Plan/Recommendation PT Assessment Clinical Impression Statement: Pt admitted with GI bleed. Pt previously in SNF before this  hospital stay. Pt states that he was doing well with therapy in the SNF and wants to go back to finish therapy before heading home. Pt was able to walk with quad cane 150 with min assist and a few LOB. Talked with pt about using a RW when first leaving hospital till he is steadier on his feet, pt agrees. PT recommends SNF for follow up therapy at D/C.  PT Recommendation/Assessment: Patient will need skilled PT in the acute care venue PT Problem List: Decreased strength;Decreased range of motion;Decreased activity tolerance;Decreased balance;Decreased mobility;Decreased coordination;Decreased knowledge of use of DME;Decreased safety awareness;Decreased knowledge of precautions Barriers to Discharge: Decreased caregiver support PT Therapy Diagnosis : Difficulty walking;Abnormality of gait;Generalized weakness PT Plan PT Frequency: Min 3X/week PT Treatment/Interventions: DME instruction;Gait training;Functional mobility training;Therapeutic activities;Therapeutic exercise;Balance training;Patient/family education PT Recommendation Follow Up Recommendations: Skilled nursing facility Equipment Recommended: Defer to next venue PT Goals  Acute Rehab PT Goals PT Goal Formulation: With patient Time For Goal Achievement: 7 days Pt will go Sit to Stand: Independently;without upper extremity assist PT Goal: Sit to Stand - Progress: Goal set today Pt will go Stand to Sit: Independently;without upper extremity assist PT Goal: Stand to Sit - Progress: Goal set today Pt will Ambulate: >150 feet;with least restrictive assistive device;Independently PT Goal: Ambulate - Progress: Goal set today Pt will Perform Home Exercise Program: Independently PT Goal: Perform Home Exercise Program - Progress: Goal set today  PT Evaluation Precautions/Restrictions  Precautions Precautions: Fall Prior Functioning  Home Living  Lives With: Alone Receives Help From: Other (Comment) (attendants at SNF) Type of Home:  Skilled Nursing Facility Home Layout: One level Home Access: Level entry Bathroom Shower/Tub: Health visitor: Handicapped height Bathroom Accessibility: Yes How Accessible: Accessible via walker Home Adaptive Equipment: Quad cane;Walker - rolling Prior Function Level of Independence: Independent with basic ADLs;Independent with transfers;Needs assistance with homemaking;Requires assistive device for independence Vocation: Retired Financial risk analyst Arousal/Alertness: Awake/alert Overall Cognitive Status: Appears within functional limits for tasks assessed Orientation Level: Oriented X4 Sensation/Coordination Sensation Light Touch: Appears Intact Stereognosis: Not tested Hot/Cold: Not tested Proprioception: Not tested Coordination Gross Motor Movements are Fluid and Coordinated: Yes Fine Motor Movements are Fluid and Coordinated: Yes Extremity Assessment RUE Assessment RUE Assessment: Within Functional Limits LUE Assessment LUE Assessment: Within Functional Limits RLE Assessment RLE Assessment: Within Functional Limits LLE Assessment LLE Assessment: Within Functional Limits Mobility (including Balance) Bed Mobility Bed Mobility: Yes Supine to Sit: 7: Independent Supine to Sit Details (indicate cue type and reason): pt sat right up to ask functioning questions without cueing or assistance  Sitting - Scoot to Edge of Bed: 5: Supervision Sitting - Scoot to [place] of Bed Details (indicate cue type and reason): cues for progression  Sit to Supine: 5: Supervision Scooting to [facility]: 4: Min assist Scooting to [facility] Details (indicate cue type and reason): needing cueing on progression, assisted with scooting pad while pt pulled with bed rails and pushed with legs  Transfers Transfers: Yes Sit to Stand: 5: Supervision Sit to Stand Details (indicate cue type and reason): needing cueing on hand placement  Stand to Sit: 5: Supervision Stand to Sit Details: needing cueing  on hand placement  Ambulation/Gait Ambulation/Gait: Yes Ambulation/Gait Assistance: 4: Min assist Ambulation/Gait Assistance Details (indicate cue type and reason): pt ambulated 150 feet with use of a quad cane, pt had a few LOB when turning and towards end of walking secondary to decreased balance and endurance Ambulation Distance (Feet): 150 Feet Assistive device: Small based quad cane Gait Pattern: Trunk flexed;Decreased stride length Stairs: No Wheelchair Mobility Wheelchair Mobility: No  Posture/Postural Control Posture/Postural Control: No significant limitations Balance Balance Assessed: Yes Dynamic Sitting Balance Dynamic Sitting - Balance Support: No upper extremity supported;Feet supported Dynamic Sitting - Level of Assistance: 5: Stand by assistance Dynamic Sitting - Comments: pt able to keep balance while looking at strength and reaching outside of BOS in sitting  End of Session PT - End of Session Equipment Utilized During Treatment: Gait belt Activity Tolerance: Patient tolerated treatment well Patient left: in bed;with call bell in reach General Behavior During Session: [facility] for tasks performed Cognition: [facility] for tasks performed  Elvera Bicker 11/29/2011, 11:49 AM

## 2011-11-30 HISTORY — PX: CARDIAC VALVE REPLACEMENT: SHX585

## 2011-11-30 LAB — TYPE AND SCREEN
ABO/RH(D): O POS
Antibody Screen: NEGATIVE
Unit division: 0

## 2011-12-05 NOTE — Discharge Summary (Signed)
Internal Medicine Teaching Service Attending Note Date: 12/05/2011  Patient name: Eugene [name]  Medical record number: [id number]  Date of birth: 11-08-1923    This patient has been seen and discussed with the house staff. Please see their note for complete details. I concur with their findings and discharge plan as outlined by Dr. Manson Passey.  Tryphena Perkovich

## 2011-12-07 ENCOUNTER — Ambulatory Visit (INDEPENDENT_AMBULATORY_CARE_PROVIDER_SITE_OTHER): Payer: Medicare Other | Admitting: Internal Medicine

## 2011-12-07 ENCOUNTER — Encounter: Payer: Medicare Other | Admitting: Internal Medicine

## 2011-12-07 ENCOUNTER — Encounter: Payer: Self-pay | Admitting: Internal Medicine

## 2011-12-07 VITALS — BP 101/56 | HR 65 | Temp 96.4°F | Ht 67.0 in | Wt 203.0 lb

## 2011-12-07 DIAGNOSIS — K5730 Diverticulosis of large intestine without perforation or abscess without bleeding: Secondary | ICD-10-CM

## 2011-12-07 DIAGNOSIS — K922 Gastrointestinal hemorrhage, unspecified: Secondary | ICD-10-CM

## 2011-12-07 DIAGNOSIS — E785 Hyperlipidemia, unspecified: Secondary | ICD-10-CM

## 2011-12-07 DIAGNOSIS — K5790 Diverticulosis of intestine, part unspecified, without perforation or abscess without bleeding: Secondary | ICD-10-CM

## 2011-12-07 DIAGNOSIS — I359 Nonrheumatic aortic valve disorder, unspecified: Secondary | ICD-10-CM

## 2011-12-07 DIAGNOSIS — R001 Bradycardia, unspecified: Secondary | ICD-10-CM

## 2011-12-07 DIAGNOSIS — I10 Essential (primary) hypertension: Secondary | ICD-10-CM

## 2011-12-07 DIAGNOSIS — I350 Nonrheumatic aortic (valve) stenosis: Secondary | ICD-10-CM

## 2011-12-07 NOTE — Patient Instructions (Signed)
Please follow up after discharge from facility. Please contact the clinic if you need assistance with discharge planning. Please continue to take all medications as directed. Please follow up with Gastroenterology and Cardiology as scheduled.

## 2011-12-08 LAB — CBC
MCH: 28.8 pg (ref 26.0–34.0)
MCHC: 29.7 g/dL — ABNORMAL LOW (ref 30.0–36.0)
Platelets: 287 10*3/uL (ref 150–400)
RDW: 17.9 % — ABNORMAL HIGH (ref 11.5–15.5)

## 2011-12-08 NOTE — Assessment & Plan Note (Signed)
Stable on current dose of Coreg, will not make any adjustments and continue this regimen.

## 2011-12-08 NOTE — Assessment & Plan Note (Signed)
Lipids are at goal, with exception of elevated triglycerides. Pt tolerating statin without side effects. Will continue statin, and monitor.  Lab Results  Component Value Date   CHOL 168 07/10/2011   HDL 39* 07/10/2011   LDLCALC 81 07/10/2011   TRIG 242* 07/10/2011   CHOLHDL 4.3 07/10/2011

## 2011-12-08 NOTE — Assessment & Plan Note (Signed)
Diverticular bleed. CBC stable, no further bleeding.  CBC:    Component Value Date/Time   WBC 8.0 12/07/2011 1109   HGB 9.2* 12/07/2011 1109   HCT 31.0* 12/07/2011 1109   PLT 287 12/07/2011 1109   MCV 96.9 12/07/2011 1109   NEUTROABS 5.1 11/25/2011 0715   LYMPHSABS 1.5 11/25/2011 0715   MONOABS 0.6 11/25/2011 0715   EOSABS 0.3 11/25/2011 0715   BASOSABS 0.1 11/25/2011 0715

## 2011-12-08 NOTE — Assessment & Plan Note (Signed)
Lab Results  Component Value Date   NA 140 11/29/2011   K 4.1 11/29/2011   CL 108 11/29/2011   CO2 23 11/29/2011   BUN 17 11/29/2011   CREATININE 0.94 11/29/2011   CREATININE 0.90 07/24/2011    BP Readings from Last 3 Encounters:  12/07/11 101/56  11/29/11 136/63  11/16/11 129/42    Assessment: Hypertension control:  controlled  Progress toward goals:  at goal Barriers to meeting goals:  no barriers identified  Plan: Hypertension treatment:  continue current medications BP on lower end of normal, but patient denies dizziness.

## 2011-12-08 NOTE — Assessment & Plan Note (Signed)
Stable, s/p valvuloplasty Jan 2013. Followed by cards. Amiodarone was recently started. Patient tolerating well without side effects.

## 2011-12-08 NOTE — Progress Notes (Signed)
Subjective:     Patient ID: Eugene [name], male   DOB: 1924/08/26, 78 y.o.   MRN: [id number]  HPI Eugene [name] is a pleasant 78 year old man with recent vulvoplasty for AS, CAD, CHF, and diverticulosis with recent hospitalization for diverticular bleed (discharged 11/28/2011) who presents for hospital follow-up. Patient is present with his healthcare power of attorney, friend, and daughter. Patient has been at [facility]) and will find out next week when he is to be discharged. Patient reports he is regaining his strength. He has an appt with cardiologist very soon for follow up of valvuloplasty. He denies chest pain, cough, sob, headache, dizziness, feelings of fainting, nausea, and vomiting. He has no further episodes of rectal bleeding, and denies changes in bowel or urinary character.   Review of Systems  All other systems reviewed and are negative.       Objective:   Physical Exam  Constitutional: He is oriented to person, place, and time. He appears well-developed.  HENT:  Head: Normocephalic.  Eyes: EOM are normal.  Neck: Normal range of motion. No JVD present.  Cardiovascular: Normal rate and regular rhythm.   Murmur heard.      2/6 systolic murmur, does not radiate to carotids  Abdominal: Soft. Bowel sounds are normal.  Musculoskeletal:       Patient was in wheelchair 1+ edema in both lower extremities  Neurological: He is alert and oriented to person, place, and time.  Skin: Skin is warm and dry.  Psychiatric: He has a normal mood and affect.

## 2011-12-08 NOTE — Assessment & Plan Note (Signed)
Recent hospitalization for diverticular bleed. Patient on plavix and aspirin which likely contributed to bleed. The agents were held, and bleeding had stopped. Last colonoscopy was in 2006. Per GI, plan is to avoid colonoscopy unless absolutely needed, due to patient's age and co morbidities. Will have patient follow up with GI since it has been some time since last colonoscopy.

## 2011-12-11 ENCOUNTER — Encounter: Payer: Self-pay | Admitting: Physician Assistant

## 2011-12-11 ENCOUNTER — Ambulatory Visit (INDEPENDENT_AMBULATORY_CARE_PROVIDER_SITE_OTHER): Payer: Medicare Other | Admitting: Physician Assistant

## 2011-12-11 VITALS — BP 108/62 | HR 50 | Ht 67.0 in | Wt 200.0 lb

## 2011-12-11 DIAGNOSIS — I10 Essential (primary) hypertension: Secondary | ICD-10-CM

## 2011-12-11 DIAGNOSIS — R001 Bradycardia, unspecified: Secondary | ICD-10-CM

## 2011-12-11 DIAGNOSIS — I509 Heart failure, unspecified: Secondary | ICD-10-CM

## 2011-12-11 DIAGNOSIS — I350 Nonrheumatic aortic (valve) stenosis: Secondary | ICD-10-CM

## 2011-12-11 DIAGNOSIS — K922 Gastrointestinal hemorrhage, unspecified: Secondary | ICD-10-CM

## 2011-12-11 DIAGNOSIS — I471 Supraventricular tachycardia: Secondary | ICD-10-CM

## 2011-12-11 DIAGNOSIS — I359 Nonrheumatic aortic valve disorder, unspecified: Secondary | ICD-10-CM

## 2011-12-11 DIAGNOSIS — I2510 Atherosclerotic heart disease of native coronary artery without angina pectoris: Secondary | ICD-10-CM

## 2011-12-11 DIAGNOSIS — E785 Hyperlipidemia, unspecified: Secondary | ICD-10-CM

## 2011-12-11 DIAGNOSIS — I498 Other specified cardiac arrhythmias: Secondary | ICD-10-CM

## 2011-12-11 LAB — BASIC METABOLIC PANEL
Calcium: 8.8 mg/dL (ref 8.4–10.5)
GFR: 62.9 mL/min (ref >60.00)
Glucose, Bld: 95 mg/dL (ref 70–99)
Sodium: 138 mEq/L (ref 135–145)

## 2011-12-11 MED ORDER — AMIODARONE HCL 200 MG PO TABS: 100.0000 mg | ORAL_TABLET | Freq: Every day | ORAL | Status: DC

## 2011-12-11 MED ORDER — CARVEDILOL 6.25 MG PO TABS: 3.1250 mg | ORAL_TABLET | Freq: Two times a day (BID) | ORAL | Status: DC

## 2011-12-11 NOTE — Patient Instructions (Addendum)
Your physician recommends that you schedule a follow-up appointment in: 1 month with Dr Antoine Poche Your physician recommends that you have lab work drawn today (BMP) Your physician has recommended you make the following change in your medication: STOP Plavix  DECREASE Carvedilol to 3.125 mg twice daily and DECREASE Amiodarone to 200 mg 1/2 tab daily

## 2011-12-11 NOTE — Progress Notes (Signed)
[address] [place], [place]  [address] Phone: [phone] Fax:  [phone]  Date:  12/11/2011   Name:  Eugene [name]       DOB:  02-01-1924 MRN:  [id number]  PCP:  Dr. Baltazar Apo Primary Cardiologist:  Dr. Rollene Rotunda  Primary Electrophysiologist:  Dr. Sherryl Manges    History of Present Illness: Eugene [name] is a 78 y.o. male who presents for post hospital follow up.  He has a complex past medical history.  This includes CAD, status post prior PCI in 1991, congestive heart failure, SVT, hypertension, hyperlipidemia and recurrent GI bleeding.  He was admitted 1/23-2/8.  He presented with possible syncope versus a fall.  Given his ischemic heart disease VTach could not be ruled out and an ischemic workup was recommended.  Echocardiogram 11/02/11: Moderate LVH, EF 30-35%, multiple wall motion abnormalities, critical aortic stenosis, AVA 0.6, mean gradient 45, mild MR, severe LAE.  Cardiac catheterization 11/02/11: Ostial LAD 95% with aneurysmal dilatation after this, proximal circumflex 70% with a superior branch 30-40%, proximal RCA 75% and 90%, mid RCA 50% and 90% before a PDA, then occluded.  He developed systolic heart failure and was diuresed with Lasix.  He was seen by cardiac surgery.  He was felt to be too high risk for CABG and AVR.  The patient initially opted for palliative care but changed his mind and opted for percutaneous intervention.  He was seen by Dr. Excell Seltzer.  He underwent balloon aortic valve valvuloplasty 11/12/11.  Postop echocardiogram demonstrated a slight improvement in his aortic valve hemodynamics: Severe LVH, EF 30-35%, severe left ear, AVA 0.7, mean gradient 36.  He then underwent PCI 11/14/11: Bare-metal stent to the proximal LAD.  Dual antiplatelet therapy was recommended for 30 days.  He had episodes of SVT/atrial tachycardia.  He was seen by EP.  Medical therapy was recommended.  Amiodarone was initiated.  Unfortunately, he was admitted again  2/17-2/21 with a lower GI bleed thought to be secondary to diverticulosis versus AVM.  Hemoglobin dropped from 11.8-8.3.  He was transfused with one unit packed red blood cells.  His aspirin was held for 2 days and Plavix for 3 days.  Of note, simvastatin was changed to Rosuvastatin during his admission.  Labs: Hemoglobin 8.4, potassium 4.1, creatinine 0.94, ALT 16, TSH 0.890.  He is staying at [facility].  He is stable.  He denies chest pain.  He works with physical therapy.  He denies any significant dyspnea with activity.  He denies syncope.  He denies near syncope.  He does note some lightheadedness with standing at times.  He denies palpitations.  He denies orthopnea, PND.  He has chronic pedal edema without significant change.  Past Medical History  Diagnosis Date  . Glaucoma     Right eye  . Diverticulosis     multiple hospital admissions for GI bleed, has an episode roughly [name] 6 months,, last  colonscopy in 2006 showed diverticulosis, was evaluated by Dr. Madilyn Fireman in the hospital in 2011 for lower GI bleed andd it  was suggested that the patient was not actively bleeding at that time and given his comorbid conditions colonoscopy was deferred. He is also chronically constipated  . Macular degeneration of right eye   . Allergic rhinitis   . CAD (coronary artery disease)     s/p PCI in 1991;  Cardiac catheterization 11/02/11: Ostial LAD 95% with aneurysmal dilatation after this, proximal circumflex 70% with a superior branch 30-40%, proximal  RCA 75% and 90%, mid RCA 50% and 90% before a PDA, then occluded.;    high risk for CABG;PCI 11/14/11: Bare-metal stent to the proximal LAD    . OSA on CPAP   . Hypertension   . Hyperlipidemia   . Chronic systolic heart failure     Echocardiogram 11/02/11: Moderate LVH, EF 30-35%, multiple wall motion abnormalities, critical aortic stenosis, AVA 0.6, mean gradient 45, mild MR, severe LAE  . Supraventricular tachycardia     2 syndromes-nonsustained atrial  tachycardia//adenosine responsive diuretic positive reentry probably AV node reentry  . Aortic stenosis     s/p AV balloon valvuloplasty by Dr. Excell Seltzer 11/2011  . Ischemic cardiomyopathy   . H/O: GI bleed     recurrent    Current Outpatient Prescriptions  Medication Sig Dispense Refill  . amiodarone (PACERONE) 200 MG tablet Take 1 tablet (200 mg total) by mouth daily.  30 tablet  2  . amLODipine (NORVASC) 5 MG tablet Take 1 tablet (5 mg total) by mouth daily.  30 tablet  0  . aspirin 81 MG tablet Take 81 mg by mouth daily.        . carvedilol (COREG) 6.25 MG tablet Take 1 tablet (6.25 mg total) by mouth 2 (two) times daily.  120 tablet  0  . clopidogrel (PLAVIX) 75 MG tablet Take 1 tablet (75 mg total) by mouth daily with breakfast.  30 tablet  2  . docusate sodium (COLACE) 100 MG capsule Take 100 mg by mouth 2 (two) times daily.        . ferrous sulfate (FERROUSUL) 325 (65 FE) MG tablet Take 1 tablet (325 mg total) by mouth daily with breakfast.  30 tablet  1  . furosemide (LASIX) 20 MG tablet Take 2 tablets (40 mg total) by mouth daily.  30 tablet  0  . losartan (COZAAR) 50 MG tablet Take 50 mg by mouth daily.      Marland Kitchen loteprednol (LOTEMAX) 0.5 % ophthalmic suspension Place 1 drop into the right eye 4 (four) times daily.        . pantoprazole (PROTONIX) 40 MG tablet Take 1 tablet (40 mg total) by mouth daily at 12 noon.  30 tablet  0  . rosuvastatin (CRESTOR) 10 MG tablet Take 1 tablet (10 mg total) by mouth daily at 6 PM.  30 tablet  0  . spironolactone (ALDACTONE) 12.5 mg TABS Take 0.5 tablets (12.5 mg total) by mouth daily.  30 tablet  0  . travoprost, benzalkonium, (TRAVATAN) 0.004 % ophthalmic solution Place 1 drop into the right eye at bedtime.          Allergies: Allergies  Allergen Reactions  . Penicillins     REACTION: "turns red"    History  Substance Use Topics  . Smoking status: Former Smoker    Types: Cigarettes    Quit date: 10/08/1960  . Smokeless tobacco: Never  Used  . Alcohol Use: No     ROS:  Please see the history of present illness.   No further blood in stool.   All other systems reviewed and negative.   PHYSICAL EXAM: VS:  BP 108/62  Pulse 50  Ht 5\' 7"  (1.702 m)  Wt 200 lb (90.719 kg)  BMI 31.32 kg/m2 Well nourished, well developed, in no acute distress HEENT: normal Neck: no JVD at 90 degrees Cardiac:  normal S1, diminished S2; RRR; 2/6 harsh systolic murmur at RUSB Lungs:  clear to auscultation bilaterally, no wheezing, rhonchi or  rales Abd: soft, nontender  Ext: trace bilateral edema; bilateral groins without hematoma or bruit  Skin: warm and dry Neuro:  CNs 2-12 intact, no focal abnormalities noted  EKG:  Probable junctional rhythm, heart rate 53, T wave inversions in V5-V6  ASSESSMENT AND PLAN:  1. Aortic stenosis  Doing well status post aortic valve balloon valvuloplasty.  We discussed the fact that he is not a candidate for CABG and aortic valve replacement at this time.  He may be a candidate for other procedures in the future and he can be referred back to valve clinic as necessary.  Followup with Dr. Antoine Poche in one month.   2. CAD (coronary artery disease)  I reviewed his case with Dr. Excell Seltzer.  In light of his recent GI bleed and use a bare metal stent for PCI, we will stop his Plavix at this time.  He will remain on aspirin.  Followup with Dr. Antoine Poche in one month.   3. Bradycardia   Question if he is symptomatic from this.  Decrease amiodarone to 100 mg daily.  Decrease carvedilol to 3.125 mg twice daily.   4. SVT (supraventricular tachycardia)  No apparent recurrence on amiodarone.   5. Chronic Systolic CHF  Volume appears stable.  Continue beta blocker and ARB.  Followup with Dr. Antoine Poche in one month.  Check a basic metabolic panel today.   6. GI bleed  No apparent recurrence.  He is apparently being referred to gastroenterology.  As noted, Plavix will be discontinued.  He will remain on aspirin.   7.  HYPERLIPIDEMIA  Management by PCP.     Signed, Tereso Newcomer, PA-C  11:11 AM 12/11/2011

## 2011-12-12 ENCOUNTER — Emergency Department (HOSPITAL_COMMUNITY): Payer: Medicare Other

## 2011-12-12 ENCOUNTER — Encounter (HOSPITAL_COMMUNITY): Payer: Self-pay | Admitting: General Practice

## 2011-12-12 ENCOUNTER — Encounter: Payer: Self-pay | Admitting: Physician Assistant

## 2011-12-12 ENCOUNTER — Inpatient Hospital Stay (HOSPITAL_COMMUNITY)
Admission: EM | Admit: 2011-12-12 | Discharge: 2011-12-17 | DRG: 378 | Disposition: A | Discharge status: Skilled Nursing Facility | Payer: Medicare Other | Source: Ambulatory Visit | Attending: Internal Medicine | Admitting: Internal Medicine

## 2011-12-12 ENCOUNTER — Encounter (HOSPITAL_COMMUNITY): Payer: Self-pay | Admitting: Internal Medicine

## 2011-12-12 ENCOUNTER — Other Ambulatory Visit: Payer: Self-pay

## 2011-12-12 ENCOUNTER — Telehealth: Payer: Self-pay | Admitting: Physician Assistant

## 2011-12-12 DIAGNOSIS — Z79899 Other long term (current) drug therapy: Secondary | ICD-10-CM

## 2011-12-12 DIAGNOSIS — I2510 Atherosclerotic heart disease of native coronary artery without angina pectoris: Secondary | ICD-10-CM | POA: Diagnosis present

## 2011-12-12 DIAGNOSIS — I10 Essential (primary) hypertension: Secondary | ICD-10-CM | POA: Diagnosis present

## 2011-12-12 DIAGNOSIS — G4733 Obstructive sleep apnea (adult) (pediatric): Secondary | ICD-10-CM | POA: Diagnosis present

## 2011-12-12 DIAGNOSIS — I471 Supraventricular tachycardia: Secondary | ICD-10-CM | POA: Diagnosis present

## 2011-12-12 DIAGNOSIS — H409 Unspecified glaucoma: Secondary | ICD-10-CM | POA: Diagnosis present

## 2011-12-12 DIAGNOSIS — K922 Gastrointestinal hemorrhage, unspecified: Principal | ICD-10-CM

## 2011-12-12 DIAGNOSIS — I350 Nonrheumatic aortic (valve) stenosis: Secondary | ICD-10-CM

## 2011-12-12 DIAGNOSIS — I5022 Chronic systolic (congestive) heart failure: Secondary | ICD-10-CM

## 2011-12-12 DIAGNOSIS — K5730 Diverticulosis of large intestine without perforation or abscess without bleeding: Secondary | ICD-10-CM

## 2011-12-12 DIAGNOSIS — Z87891 Personal history of nicotine dependence: Secondary | ICD-10-CM

## 2011-12-12 DIAGNOSIS — H3530 Unspecified macular degeneration: Secondary | ICD-10-CM | POA: Diagnosis present

## 2011-12-12 DIAGNOSIS — Z951 Presence of aortocoronary bypass graft: Secondary | ICD-10-CM

## 2011-12-12 DIAGNOSIS — Z947 Corneal transplant status: Secondary | ICD-10-CM

## 2011-12-12 DIAGNOSIS — I498 Other specified cardiac arrhythmias: Secondary | ICD-10-CM | POA: Diagnosis present

## 2011-12-12 DIAGNOSIS — K5909 Other constipation: Secondary | ICD-10-CM | POA: Diagnosis present

## 2011-12-12 DIAGNOSIS — I509 Heart failure, unspecified: Secondary | ICD-10-CM | POA: Diagnosis present

## 2011-12-12 DIAGNOSIS — E785 Hyperlipidemia, unspecified: Secondary | ICD-10-CM | POA: Diagnosis present

## 2011-12-12 DIAGNOSIS — Z9861 Coronary angioplasty status: Secondary | ICD-10-CM

## 2011-12-12 DIAGNOSIS — D649 Anemia, unspecified: Secondary | ICD-10-CM

## 2011-12-12 DIAGNOSIS — I2589 Other forms of chronic ischemic heart disease: Secondary | ICD-10-CM | POA: Diagnosis present

## 2011-12-12 LAB — CBC
Hemoglobin: 9.7 g/dL — ABNORMAL LOW (ref 13.0–17.0)
Hemoglobin: 9 g/dL — ABNORMAL LOW (ref 13.0–17.0)
MCH: 29.5 pg (ref 26.0–34.0)
MCHC: 31.9 g/dL (ref 30.0–36.0)
MCV: 89.8 fL (ref 78.0–100.0)
RBC: 3.34 MIL/uL — ABNORMAL LOW (ref 4.22–5.81)
RBC: 3.05 MIL/uL — ABNORMAL LOW (ref 4.22–5.81)

## 2011-12-12 LAB — COMPREHENSIVE METABOLIC PANEL
ALT: 7 U/L (ref 0–53)
AST: 9 U/L (ref 0–37)
Albumin: 3.1 g/dL — ABNORMAL LOW (ref 3.5–5.2)
Alkaline Phosphatase: 107 U/L (ref 39–117)
BUN: 31 mg/dL — ABNORMAL HIGH (ref 6–23)
Chloride: 106 mEq/L (ref 96–112)
Potassium: 4.5 mEq/L (ref 3.5–5.1)
Total Bilirubin: 0.2 mg/dL — ABNORMAL LOW (ref 0.3–1.2)

## 2011-12-12 LAB — TYPE AND SCREEN: ABO/RH(D): O POS

## 2011-12-12 LAB — URINALYSIS, ROUTINE W REFLEX MICROSCOPIC
Glucose, UA: NEGATIVE mg/dL
Hgb urine dipstick: NEGATIVE
Specific Gravity, Urine: 1.019 (ref 1.005–1.030)
pH: 6 (ref 5.0–8.0)

## 2011-12-12 LAB — OCCULT BLOOD X 1 CARD TO LAB, STOOL: Fecal Occult Bld: POSITIVE

## 2011-12-12 LAB — DIFFERENTIAL
Basophils Relative: 1 % (ref 0–1)
Monocytes Relative: 10 % (ref 3–12)
Neutro Abs: 4.1 10*3/uL (ref 1.7–7.7)
Neutrophils Relative %: 63 % (ref 43–77)

## 2011-12-12 MED ORDER — SODIUM CHLORIDE 0.9 % IJ SOLN
3.0000 mL | Freq: Two times a day (BID) | INTRAMUSCULAR | Status: DC
  Administered 2011-12-12 – 2011-12-17 (×11): 3 mL via INTRAVENOUS

## 2011-12-12 MED ORDER — SIMVASTATIN 5 MG PO TABS
5.0000 mg | ORAL_TABLET | Freq: Every day | ORAL | Status: DC
  Administered 2011-12-12 – 2011-12-16 (×5): 5 mg via ORAL
  Filled 2011-12-12 (×6): qty 1

## 2011-12-12 MED ORDER — FERROUS SULFATE 325 (65 FE) MG PO TABS
325.0000 mg | ORAL_TABLET | Freq: Every day | ORAL | Status: DC
  Administered 2011-12-13 – 2011-12-17 (×5): 325 mg via ORAL
  Filled 2011-12-12 (×6): qty 1

## 2011-12-12 MED ORDER — SODIUM CHLORIDE 0.9 % IV SOLN
INTRAVENOUS | Status: DC
  Administered 2011-12-12: 16:00:00 via INTRAVENOUS

## 2011-12-12 MED ORDER — CARVEDILOL 6.25 MG PO TABS
6.2500 mg | ORAL_TABLET | Freq: Two times a day (BID) | ORAL | Status: DC
  Administered 2011-12-13 – 2011-12-17 (×10): 6.25 mg via ORAL
  Filled 2011-12-12 (×12): qty 1

## 2011-12-12 MED ORDER — TRAVOPROST (BAK FREE) 0.004 % OP SOLN
1.0000 [drp] | Freq: Every day | OPHTHALMIC | Status: DC
  Administered 2011-12-13 – 2011-12-16 (×5): 1 [drp] via OPHTHALMIC
  Filled 2011-12-12 (×2): qty 2.5

## 2011-12-12 MED ORDER — LOTEPREDNOL ETABONATE 0.5 % OP SUSP
1.0000 [drp] | Freq: Every morning | OPHTHALMIC | Status: DC
  Administered 2011-12-12 – 2011-12-17 (×5): 1 [drp] via OPHTHALMIC
  Filled 2011-12-12 (×2): qty 5

## 2011-12-12 MED ORDER — ONDANSETRON HCL 4 MG PO TABS: 4.0000 mg | ORAL_TABLET | Freq: Four times a day (QID) | ORAL | Status: DC | PRN

## 2011-12-12 MED ORDER — AMLODIPINE BESYLATE 5 MG PO TABS
5.0000 mg | ORAL_TABLET | Freq: Every day | ORAL | Status: DC
  Administered 2011-12-12 – 2011-12-17 (×6): 5 mg via ORAL
  Filled 2011-12-12 (×6): qty 1

## 2011-12-12 MED ORDER — DOCUSATE SODIUM 100 MG PO CAPS
100.0000 mg | ORAL_CAPSULE | Freq: Two times a day (BID) | ORAL | Status: DC
  Administered 2011-12-13 – 2011-12-17 (×10): 100 mg via ORAL
  Filled 2011-12-12 (×11): qty 1

## 2011-12-12 MED ORDER — BISACODYL 5 MG PO TBEC: 5.0000 mg | DELAYED_RELEASE_TABLET | Freq: Every day | ORAL | Status: DC | PRN

## 2011-12-12 MED ORDER — AMIODARONE HCL 200 MG PO TABS
200.0000 mg | ORAL_TABLET | Freq: Every day | ORAL | Status: DC
  Administered 2011-12-12 – 2011-12-13 (×2): 200 mg via ORAL
  Filled 2011-12-12 (×3): qty 1

## 2011-12-12 MED ORDER — LOSARTAN POTASSIUM 50 MG PO TABS
50.0000 mg | ORAL_TABLET | Freq: Every day | ORAL | Status: DC
  Administered 2011-12-12 – 2011-12-17 (×6): 50 mg via ORAL
  Filled 2011-12-12 (×6): qty 1

## 2011-12-12 MED ORDER — PANTOPRAZOLE SODIUM 20 MG PO TBEC
20.0000 mg | DELAYED_RELEASE_TABLET | Freq: Every day | ORAL | Status: DC
  Administered 2011-12-12 – 2011-12-17 (×6): 20 mg via ORAL
  Filled 2011-12-12 (×6): qty 1

## 2011-12-12 MED ORDER — ONDANSETRON HCL 4 MG/2ML IJ SOLN: 4.0000 mg | Freq: Four times a day (QID) | INTRAMUSCULAR | Status: DC | PRN

## 2011-12-12 MED ORDER — TRAVOPROST 0.004 % OP SOLN
1.0000 [drp] | Freq: Every day | OPHTHALMIC | Status: DC
  Filled 2011-12-12 (×4): qty 0.1

## 2011-12-12 NOTE — H&P (Signed)
Hospital Admission Note Date: 12/12/2011  Patient name:  Eugene [name]   Medical record number:  [id number] Date of birth:  1924-08-06  Age: 78 y.o. Gender:  male PCP:    Melida Quitter, MD, MD  Medical Service:   Internal Medicine Teaching Service   Attending physician:  Dr. Aundria Rud First Contact:   Dr. Dorise Hiss  Pager: [phone]                                               Second Contact:   Dr. Dorthula Rue  Pager: [phone] After Hours:    First Contact   Pager: [phone]      Second Contact  Pager: [phone]   Chief Complaint: Bright red blood per rectum   History of Present Illness: Patient is a 78 y.o. male with a PMHx of coronary artery disease status post CABG and recent PTCA and frequent hospital admission for GI bleed probably secondary to diverticulosis (last colonoscopy in 2006) who presents to [facility] for evaluation of bright red blood in stool early this morning. Patient was completely fine yesterday. This morning when he had a bowel movement , he saw a lot of blood in the toilet bowl. He did not have any abdominal pain or rectal pain prior to the onset of symptoms. He has chronic constipation which had gotten worse over the weekend before the onset of the symptoms. He uses milk of magnesia at home which did not help much. He also has been feeling dizzy on trying to get up this morning. He is on aspirin and Plavix for his coronary artery disease. He was seen by his cardiologist last week at which time Plavix was stopped but he probably is still getting it from his nursing home. He denies any other complaints like chest pain, headaches, focal neurological deficits, falls or syncope. He had similar episode multiple times in the past which generally resolves on its own. He has not seen a stomach doctor since 2006 which was the last time he had a colonoscopy.   Current Outpatient Medications: No current facility-administered medications for this encounter.   Current Outpatient Prescriptions    Medication Sig Dispense Refill  . amiodarone (PACERONE) 200 MG tablet Take 200 mg by mouth daily.      Marland Kitchen amLODipine (NORVASC) 5 MG tablet Take 5 mg by mouth daily.      Marland Kitchen aspirin 81 MG tablet Take 81 mg by mouth daily.        . carvedilol (COREG) 6.25 MG tablet Take 6.25 mg by mouth 2 (two) times daily.      Marland Kitchen docusate sodium (COLACE) 100 MG capsule Take 100 mg by mouth 2 (two) times daily.       . ferrous sulfate 325 (65 FE) MG tablet Take 325 mg by mouth daily with breakfast.      . furosemide (LASIX) 40 MG tablet Take 40 mg by mouth daily.      . lansoprazole (PREVACID) 30 MG capsule Take 30 mg by mouth daily at 12 noon.      Marland Kitchen losartan (COZAAR) 50 MG tablet Take 50 mg by mouth daily.      Marland Kitchen loteprednol (LOTEMAX) 0.5 % ophthalmic suspension Place 1 drop into the right eye every morning.       . magnesium hydroxide (MILK OF MAGNESIA) 400 MG/5ML suspension Take 30 mLs by mouth daily  as needed. For constipation      . pravastatin (PRAVACHOL) 40 MG tablet Take 40 mg by mouth every evening.      Marland Kitchen spironolactone (ALDACTONE) 25 MG tablet Take 12.5 mg by mouth every morning.      . travoprost, benzalkonium, (TRAVATAN) 0.004 % ophthalmic solution Place 1 drop into the right eye at bedtime.          Allergies: Allergies  Allergen Reactions  . Penicillins     REACTION: "turns red"     Past Medical History: Past Medical History  Diagnosis Date  . Glaucoma     Right eye  . Diverticulosis     multiple hospital admissions for GI bleed, has an episode roughly every 6 months,, last  colonscopy in 2006 showed diverticulosis, was evaluated by Dr. Madilyn Fireman in the hospital in 2011 for lower GI bleed andd it  was suggested that the patient was not actively bleeding at that time and given his comorbid conditions colonoscopy was deferred. He is also chronically constipated  . Macular degeneration of right eye   . Allergic rhinitis   . CAD (coronary artery disease)     status post percutaneous  transluminal coronary angioplasty in 1991, repeat admission in February 2013 for syncope at which time he was found to have aortic stenosis requiring balloon valvuloplasty and 90% stenosis of the LAD requiring stenting. Patient was placed on Plavix in addition to aspirin for about a month. Plavix was discontinued by his cardiologist in early March 2013   . OSA on CPAP   . Hypertension   . Hyperlipidemia   . Chronic CHF (congestive heart failure)     ejection fraction of 30-35%, grade 2 diastolic  dysfunction, in February 2013  . Supraventricular tachycardia     2 syndromes-nonsustained atrial tachycardia//adenosine responsive diuretic positive reentry probably AV node reentry    Past Surgical History: Past Surgical History  Procedure Date  . Implantation of baerveldt glaucoma device lant with scleral reinforcement using tutoplast tissue graft right eye.   . Coronary angioplasty with stent placement   . Corneal transplant     Family History: No family history on file.  Social History: History   Social History  . Marital Status: Widowed    Spouse Name: N/A    Number of Children: N/A  . Years of Education: N/A   Occupational History  . Not on file.   Social History Main Topics  . Smoking status: Former Smoker    Types: Cigarettes    Quit date: 10/08/1960  . Smokeless tobacco: Never Used  . Alcohol Use: No  . Drug Use: No  . Sexually Active: No   Other Topics Concern  . Not on file   Social History Narrative   Lives at the [facility] nursing home. He has a daughter in [place] and a health-care power of attorney. He also has a close friend who takes him for his doctor's appointments and is with him during the hospital admission. 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 Never smoked or drank alcohol in past 40 years. No illicit drug use. Has Medicare.    Review of Systems: Constitutional:  Denies fever, chills, diaphoresis,  appetite change and fatigue.  HEENT: Denies photophobia, eye pain, redness, hearing loss, ear pain, congestion, sore throat, rhinorrhea, sneezing, mouth sores, trouble swallowing, neck pain, neck stiffness and tinnitus.  Respiratory: Denies SOB, DOE, cough, chest tightness, and wheezing.  Cardiovascular: Denies chest  pain, palpitations and leg swelling.  Gastrointestinal: Denies nausea, vomiting, abdominal pain, diarrhea, constipation, blood in stool and abdominal distention.  Genitourinary: Denies dysuria, urgency, frequency, hematuria, flank pain and difficulty urinating.  Musculoskeletal: Denies myalgias, back pain, joint swelling, arthralgias and gait problem.   Skin: Denies pallor, rash and wound.  Neurological: Denies dizziness, seizures, syncope, weakness, light-headedness, numbness and headaches.   Hematological: Denies adenopathy. Easy bruising, personal or family bleeding history.  Psychiatric/ Behavioral: Denies suicidal ideation, mood changes, confusion, nervousness, sleep disturbance and agitation.    Filed Vitals:   12/12/11 0806  BP: 132/66 (lying), 144/68 ( sitting) , the patient is too unsteady on his feet and was unable to do standing blood pressure   Pulse: 53  Temp: 97.9 F (36.6 C)  Resp: 22     Physical Exam: General: Vital signs reviewed and noted. Well-developed, well-nourished, in no acute distress; alert, appropriate and cooperative throughout examination. very hard of hearing   Head: Normocephalic, atraumatic.  Eyes: PERRL, EOMI, No signs of anemia or jaundince.  Nose: Mucous membranes moist, not inflammed, nonerythematous.  Throat: Oropharynx nonerythematous, no exudate appreciated.   Neck: No deformities, masses, or tenderness noted.Supple, No carotid Bruits, no JVD.  Lungs:  Normal respiratory effort. Clear to auscultation BL without crackles or wheezes.  Heart: RRR. S1 and S2 normal without gallop,faint systolic murmur   Rectum   maroon colored stool in  the rectal vault , no hemorrhoids felt   Abdomen:  BS normoactive. Soft, Nondistended, non-tender.  No masses or organomegaly.  Extremities: No pretibial edema.  Neurologic: A&O X3, CN II - XII are grossly intact. Motor strength is 5/5 in the all 4 extremities, Sensations intact to light touch, Cerebellar signs negative.  Skin: No visible rashes, scars.   Lab results: Basic Metabolic Panel: Recent Labs  [facility] 12/12/11 0825 12/11/11 1157   NA 139 138   K 4.5 4.4   CL 106 101   CO2 24 25   GLUCOSE 92 95   BUN 31* 31*   CREATININE 1.07 1.2   CALCIUM 9.1 8.8   MG -- --   PHOS -- --   Liver Function Tests: Recent Labs  [facility] 12/12/11 0825   AST 9   ALT 7   ALKPHOS 107   BILITOT 0.2*   PROT 6.1   ALBUMIN 3.1*   No results found for this basename: LIPASE:2,AMYLASE:2 in the last 72 hours CBC: Recent Labs  Basename 12/12/11 0825   WBC 6.5   NEUTROABS 4.1   HGB 9.7*   HCT 30.4*   MCV 91.0   PLT 274   Imaging results:  Dg Chest Port 1 View  12/12/2011  *RADIOLOGY REPORT*  Clinical Data: Chest pain.  Rectal bleeding.  PORTABLE CHEST - 1 VIEW  Comparison: Multiple priors, most recently chest x-ray 11/11/2011.  Findings: Lung volumes are normal.  No consolidative airspace disease.  No pleural effusions.  Pulmonary vasculature is normal. Mild cardiomegaly (unchanged).  Atherosclerotic calcifications within the arch of the aorta.  Old healed fracture of right proximal humerus incompletely visualized.  IMPRESSION: 1.  No radiographic evidence of acute cardiopulmonary disease. 2.  Mild cardiomegaly, unchanged. 3.  Atherosclerosis.  Original Report Authenticated By: Florencia Reasons, M.D.    Other results: EKG:   Assessment & Plan:  78 year old man with coronary artery disease and history of diverticulosis with frequent admissions for GI bleed comes in the emergency department with one episode of painless rectal bleeding this morning. Hemodynamically stable and  hemoglobin is  better than his previous hemoglobin last month but he is dizzy on standing up.    #1 GI bleed -Again the most likely causes diverticulosis or AV malformation -He is hemodynamically stable and does not require excessive resuscitation -He is on aspirin for coronary artery disease. Is not on Plavix anymore.  Plan -Admit to telemetry for 24-hour observation -Hold aspirin -CBC every 12 hours to monitor hemoglobin -Hold Lasix and Aldactone for probable dehydration. IV fluids to [facility] -Consult GI if persistent bleeding. May need inpatient evaluation in that case. - Keep on clear liquid diet and bowel management for constipation   #2 coronary artery disease status post CABG and PTCA  - Check EKG for silent ischemia  -Hold aspirin - Continue beta blocker because blood pressure is stable - Hold Lasix and Aldactone for dehydration  -Make sure the patient has clear instruction not to restart Plavix at discharge because his cardiologist has stopped it at the last office visit.   #3 obstructive sleep apnea  -Use CPAP at night   #4 hypertension and hyperlipidemia - Continue all medicines as listed above.   #5 DVT PPX - mechanical because of GI bleed     Bethel Born, M.D. (PGY3):  ____________________________________    Date/ Time:     __3/6/13__________________________________        I have seen and examined the patient. I reviewed the resident/fellow note and agree with the findings and plan of care as documented. My additions and revisions are included.   Signature:  ____________________________________________     Internal Medicine Teaching Service Attending    Date:    ____________________________________________      and and

## 2011-12-12 NOTE — ED Notes (Signed)
Pt presents with "bright red" rectal bleeding since this morning. Pt reports hospitalized at this facility 4 weeks ago. Condition is made worse by bowel movement. Pt states normal BM this morning. Pt is on blood thinners

## 2011-12-12 NOTE — ED Notes (Signed)
Report received, assumed care.  

## 2011-12-12 NOTE — Progress Notes (Signed)
Clinical Social Work-CSW received phone call from pt POA/pastor who relayed that pt on his way to ED-POA requested that treatment team pursue GI consult as was discussed upon last admission. CSW met with POA on floor while awaiting pt to arrive in room-CSW provided pastor with phone charger so that he may continue his visit with pt. Pt came via [facility] and plan is to return at d/c-Full assessment to follow in shadow chart-Nakoma Gotwalt-MSW, [phone]

## 2011-12-12 NOTE — Telephone Encounter (Signed)
Allison Quarry POA called stated someone called yesterday about patient's lab.States did not understand message.Bmet that was done 12/11/11 was normal.

## 2011-12-12 NOTE — Telephone Encounter (Signed)
Pt's POA calling for blood work results, [phone]

## 2011-12-12 NOTE — ED Provider Notes (Signed)
History     CSN: [id number]  Arrival date & time 12/12/11  9147   First MD Initiated Contact with Patient 12/12/11 0757      Chief Complaint  Patient presents with  . Rectal Bleeding    (Consider location/radiation/quality/duration/timing/severity/associated sxs/prior treatment) HPI Patient presents this morning with rectal bleeding. He states he woke up at 5 AM and had a large bloody stool. He denies any abdominal pain. He states he is lightheaded. He denies nausea or vomiting. He states he has not had a bowel movement since the initial one. He states that he is on blood thinners for heart disease. Patient denies any history of same. However, chart review reveals that he was recently evaluated in the hospital for GI bleeding that was thought to be diverticulitis versus AVM. He did require transfusion of one unit of packed red blood cells. During that time his Plavix and aspirin were held. These have been restarted. He is a chance of the teaching service. Past Medical History  Diagnosis Date  . Glaucoma     Right eye  . Diverticulosis   . Macular degeneration of right eye   . Allergic rhinitis   . CAD (coronary artery disease)     status post percutaneous transluminal coronary angioplasty in 1991.  . OSA on CPAP   . Hypertension   . Hyperlipidemia   . Chronic CHF (congestive heart failure)     ejection fraction of 40-45%, grade 2 diastolic  dysfunction, and mild aortic stenosis  . Supraventricular tachycardia     2 syndromes-nonsustained atrial tachycardia//adenosine responsive diuretic positive reentry probably AV node reentry  . Shortness of breath   . Sleep apnea     uses cpap  . 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  Patient amenable to repeat colonoscopy if needed on future admissions.    Past Surgical History  Procedure Date  . Implantation of baerveldt glaucoma device lant with scleral reinforcement using  tutoplast tissue graft right eye.   . Coronary angioplasty with stent placement   . Corneal transplant     No family history on file.  History  Substance Use Topics  . Smoking status: Former Smoker    Types: Cigarettes    Quit date: 10/08/1960  . Smokeless tobacco: Never Used  . Alcohol Use: No      Review of Systems  All other systems reviewed and are negative.    Allergies  Penicillins  Home Medications   Current Outpatient Rx  Name Route Sig Dispense Refill  . AMIODARONE HCL 200 MG PO TABS Oral Take 200 mg by mouth daily.    Marland Kitchen AMLODIPINE BESYLATE 5 MG PO TABS Oral Take 5 mg by mouth daily.    . ASPIRIN 81 MG PO TABS Oral Take 81 mg by mouth daily.      Marland Kitchen CARVEDILOL 6.25 MG PO TABS Oral Take 6.25 mg by mouth 2 (two) times daily.    Marland Kitchen CLOPIDOGREL BISULFATE 75 MG PO TABS Oral Take 75 mg by mouth daily before breakfast.    . DOCUSATE SODIUM 100 MG PO CAPS Oral Take 100 mg by mouth 2 (two) times daily.     Marland Kitchen FERROUS SULFATE 325 (65 FE) MG PO TABS Oral Take 325 mg by mouth daily with breakfast.    . FUROSEMIDE 40 MG PO TABS Oral Take 40 mg by mouth daily.    Marland Kitchen LANSOPRAZOLE 30 MG PO CPDR  Oral Take 30 mg by mouth daily at 12 noon.    Marland Kitchen LOSARTAN POTASSIUM 50 MG PO TABS Oral Take 50 mg by mouth daily.    Marland Kitchen LOTEPREDNOL ETABONATE 0.5 % OP SUSP Right Eye Place 1 drop into the right eye every morning.     Marland Kitchen MAGNESIUM HYDROXIDE 400 MG/5ML PO SUSP Oral Take 30 mLs by mouth daily as needed. For constipation    . PRAVASTATIN SODIUM 40 MG PO TABS Oral Take 40 mg by mouth every evening.    Marland Kitchen SPIRONOLACTONE 25 MG PO TABS Oral Take 12.5 mg by mouth every morning.    . TRAVOPROST 0.004 % OP SOLN Right Eye Place 1 drop into the right eye at bedtime.        BP 132/66  Pulse 53  Temp(Src) 97.9 F (36.6 C) (Oral)  Resp 22  SpO2 95%  Physical Exam  Nursing note and vitals reviewed. Constitutional: He is oriented to person, place, and time. He appears well-developed and well-nourished.    HENT:  Head: Normocephalic and atraumatic.  Left Ear: External ear normal.  Nose: Nose normal.  Mouth/Throat: Oropharynx is clear and moist.  Eyes: Pupils are equal, round, and reactive to light.       Conjunctivae pale  Neck: Normal range of motion. Neck supple.  Cardiovascular: Regular rhythm.  Bradycardia present.   Murmur heard.  Systolic murmur is present with a grade of 4/6  Genitourinary:       Maroon stool present rectal exam. No mass or tenderness noted  Musculoskeletal:       Trace edema diffuse bilateral lower extremities  Neurological: He is alert and oriented to person, place, and time.  Skin: Skin is warm and dry. There is pallor.  Psychiatric: He has a normal mood and affect.    ED Course  Procedures (including critical care time)   Labs Reviewed  CBC  DIFFERENTIAL  COMPREHENSIVE METABOLIC PANEL  PROTIME-INR  APTT  URINALYSIS, ROUTINE W REFLEX MICROSCOPIC  OCCULT BLOOD X 1 CARD TO LAB, STOOL  TYPE AND SCREEN   Dg Chest Port 1 View  12/12/2011  *RADIOLOGY REPORT*  Clinical Data: Chest pain.  Rectal bleeding.  PORTABLE CHEST - 1 VIEW  Comparison: Multiple priors, most recently chest x-Khya Halls 11/11/2011.  Findings: Lung volumes are normal.  No consolidative airspace disease.  No pleural effusions.  Pulmonary vasculature is normal. Mild cardiomegaly (unchanged).  Atherosclerotic calcifications within the arch of the aorta.  Old healed fracture of right proximal humerus incompletely visualized.  IMPRESSION: 1.  No radiographic evidence of acute cardiopulmonary disease. 2.  Mild cardiomegaly, unchanged. 3.  Atherosclerosis.  Original Report Authenticated By: Florencia Reasons, M.D.     No diagnosis found. Results for orders placed during the hospital encounter of 12/12/11  CBC      Component Value Range   WBC 6.5  4.0 - 10.5 (K/uL)   RBC 3.34 (*) 4.22 - 5.81 (MIL/uL)   Hemoglobin 9.7 (*) 13.0 - 17.0 (g/dL)   HCT 16.1 (*) 09.6 - 52.0 (%)   MCV 91.0  78.0 - 100.0  (fL)   MCH 29.0  26.0 - 34.0 (pg)   MCHC 31.9  30.0 - 36.0 (g/dL)   RDW 04.5 (*) 40.9 - 15.5 (%)   Platelets 274  150 - 400 (K/uL)  DIFFERENTIAL      Component Value Range   Neutrophils Relative 63  43 - 77 (%)   Neutro Abs 4.1  1.7 - 7.7 (K/uL)  Lymphocytes Relative 19  12 - 46 (%)   Lymphs Abs 1.2  0.7 - 4.0 (K/uL)   Monocytes Relative 10  3 - 12 (%)   Monocytes Absolute 0.7  0.1 - 1.0 (K/uL)   Eosinophils Relative 8 (*) 0 - 5 (%)   Eosinophils Absolute 0.5  0.0 - 0.7 (K/uL)   Basophils Relative 1  0 - 1 (%)   Basophils Absolute 0.1  0.0 - 0.1 (K/uL)  COMPREHENSIVE METABOLIC PANEL      Component Value Range   Sodium 139  135 - 145 (mEq/L)   Potassium 4.5  3.5 - 5.1 (mEq/L)   Chloride 106  96 - 112 (mEq/L)   CO2 24  19 - 32 (mEq/L)   Glucose, Bld 92  70 - 99 (mg/dL)   BUN 31 (*) 6 - 23 (mg/dL)   Creatinine, Ser 1.61  0.50 - 1.35 (mg/dL)   Calcium 9.1  8.4 - 09.6 (mg/dL)   Total Protein 6.1  6.0 - 8.3 (g/dL)   Albumin 3.1 (*) 3.5 - 5.2 (g/dL)   AST 9  0 - 37 (U/L)   ALT 7  0 - 53 (U/L)   Alkaline Phosphatase 107  39 - 117 (U/L)   Total Bilirubin 0.2 (*) 0.3 - 1.2 (mg/dL)   GFR calc non Af Amer 61 (*) >90 (mL/min)   GFR calc Af Amer 71 (*) >90 (mL/min)  PROTIME-INR      Component Value Range   Prothrombin Time 13.9  11.6 - 15.2 (seconds)   INR 1.05  0.00 - 1.49   APTT      Component Value Range   aPTT 29  24 - 37 (seconds)  OCCULT BLOOD X 1 CARD TO LAB, STOOL      Component Value Range   Fecal Occult Bld POSITIVE    TYPE AND SCREEN      Component Value Range   ABO/RH(D) O POS     Antibody Screen NEG     Sample Expiration 12/15/2011      CRITICAL CARE Performed by: Hilario Quarry   Total critical care time:30  Critical care time was exclusive of separately billable procedures and treating other patients.  Critical care was necessary to treat or prevent imminent or life-threatening deterioration.  Critical care was time spent personally by me on the  following activities: development of treatment plan with patient and/or surrogate as well as nursing, discussions with consultants, evaluation of patient's response to treatment, examination of patient, obtaining history from patient or surrogate, ordering and performing treatments and interventions, ordering and review of laboratory studies, ordering and review of radiographic studies, pulse oximetry and re-evaluation of patient's condition.   Patient with active GI bleeding. His previous record was reviewed. Patient has been evaluated multiple times for stable blood pressure and heart rate. He remained awake alert although he states he does feel somewhat lightheaded. EKG shows some inferior and lateral depression but this is unchanged from his prior EKG.  MDM   Date: 12/12/2011  Rate: 50  Rhythm: sinus bradycardia  QRS Axis: left  Intervals: first degree av block  ST/T Wave abnormalities: ST depressions inferiorly and ST depressions laterally  Conduction Disutrbances:none  Narrative Interpretation:   Old EKG Reviewed: unchanged   I've reviewed the patient's previous charts. I have spoken with Dr. Scot Dock.   Blood pressure is 132/66. Patient has been typed and screened I do not think he needs to be transfused at this time as his hemoglobin has improved  from 9.2-9.7 from March 1 2 today.      Hilario Quarry, MD 12/12/11 1003

## 2011-12-12 NOTE — ED Notes (Signed)
No active bleeding presently. No voiced complaints presently. NAD. Awaiting bed assignment. Resting, eyes closed, easily aroused.

## 2011-12-13 LAB — CBC
Hemoglobin: 8.8 g/dL — ABNORMAL LOW (ref 13.0–17.0)
MCHC: 32.1 g/dL (ref 30.0–36.0)
MCV: 90.9 fL (ref 78.0–100.0)
Platelets: 288 10*3/uL (ref 150–400)
RBC: 2.98 MIL/uL — ABNORMAL LOW (ref 4.22–5.81)
WBC: 7.2 10*3/uL (ref 4.0–10.5)

## 2011-12-13 LAB — BASIC METABOLIC PANEL
BUN: 22 mg/dL (ref 6–23)
GFR calc Af Amer: 88 mL/min — ABNORMAL LOW (ref >90)
GFR calc non Af Amer: 76 mL/min — ABNORMAL LOW (ref >90)
Potassium: 4.2 mEq/L (ref 3.5–5.1)
Sodium: 138 mEq/L (ref 135–145)

## 2011-12-13 MED ORDER — ACETAMINOPHEN 325 MG PO TABS
650.0000 mg | ORAL_TABLET | Freq: Four times a day (QID) | ORAL | Status: DC | PRN
  Administered 2011-12-13 – 2011-12-16 (×4): 650 mg via ORAL
  Filled 2011-12-13 (×4): qty 2

## 2011-12-13 MED ORDER — PEG 3350-KCL-NA BICARB-NACL 420 G PO SOLR
4000.0000 mL | Freq: Once | ORAL | Status: DC
  Filled 2011-12-13: qty 4000

## 2011-12-13 NOTE — H&P (Signed)
IM Attending  33 man with hematochezia.  Prior similar admissions.  Last colonoscopy 2006: diverticulosis.  He has chronic constipation and is on ASA and plavix for CAD & stent.  Also has severe aortic stenosis and has had valvuloplasty.  Hgb 9.7 (many recent readings between 8 and 11; seems to have been higher in recent past years.  Otherwise he seems stable with normal vital signs and labs.  Dr. Madilyn Fireman of GI has seen him and plans colonoscopy tomorrow. Have reviewed residents' notes and agree with management.

## 2011-12-13 NOTE — Progress Notes (Signed)
Subjective: The patient is very pleasant and has not had any repeat episodes of blood per rectum. He is willing to get colonoscopy and I spoke with him about that and what he would have to do. Otherwise he is feeling well. No SOB, no chest pain, no abdominal pain. No leg swelling. No nausea or vomiting.   Objective: Vital signs in last 24 hours: Filed Vitals:   12/13/11 0437 12/13/11 0839 12/13/11 1156 12/13/11 1325  BP: 110/44 123/52 116/54 101/51  Pulse: 57 65 108 60  Temp: 98.1 F (36.7 C) 98.2 F (36.8 C) 97.6 F (36.4 C) 97.1 F (36.2 C)  TempSrc: Oral Oral Oral Oral  Resp: 18 17 18 18   Height:      Weight:      SpO2: 96% 96% 100%    Weight change:   Intake/Output Summary (Last 24 hours) at 12/13/11 1446 Last data filed at 12/13/11 1300  Gross per 24 hour  Intake 1945.33 ml  Output   2877 ml  Net -931.67 ml   Physical Exam: General: resting in bed,pleasant, talkative HEENT: PERRL, EOMI, no scleral icterus Cardiac: RRR, no rubs, murmurs or gallops Pulm: clear to auscultation bilaterally, moving normal volumes of air Abd: soft, nontender, nondistended, BS present Ext: warm and well perfused, no pedal edema Neuro: alert and oriented X3, cranial nerves II-XII grossly intact  Lab Results: Basic Metabolic Panel:  Lab 12/13/11 4098 12/12/11 0825  NA 138 139  K 4.2 4.5  CL 107 106  CO2 22 24  GLUCOSE 103* 92  BUN 22 31*  CREATININE 0.89 1.07  CALCIUM 8.3* 9.1  MG -- --  PHOS -- --   Liver Function Tests:  Lab 12/12/11 0825  AST 9  ALT 7  ALKPHOS 107  BILITOT 0.2*  PROT 6.1  ALBUMIN 3.1*   CBC:  Lab 12/13/11 0640 12/12/11 1653 12/12/11 0825  WBC 7.9 5.6 --  NEUTROABS -- -- 4.1  HGB 8.8* 9.0* --  HCT 27.4* 27.4* --  MCV 91.9 89.8 --  PLT 263 247 --   Coagulation:  Lab 12/12/11 0825  LABPROT 13.9  INR 1.05   Urinalysis:  Lab 12/12/11 1003  COLORURINE YELLOW  LABSPEC 1.019  PHURINE 6.0  GLUCOSEU NEGATIVE  HGBUR NEGATIVE  BILIRUBINUR  NEGATIVE  KETONESUR NEGATIVE  PROTEINUR NEGATIVE  UROBILINOGEN 0.2  NITRITE NEGATIVE  LEUKOCYTESUR NEGATIVE   Studies/Results: Dg Chest Port 1 View  12/12/2011  *RADIOLOGY REPORT*  Clinical Data: Chest pain.  Rectal bleeding.  PORTABLE CHEST - 1 VIEW  Comparison: Multiple priors, most recently chest x-ray 11/11/2011.  Findings: Lung volumes are normal.  No consolidative airspace disease.  No pleural effusions.  Pulmonary vasculature is normal. Mild cardiomegaly (unchanged).  Atherosclerotic calcifications within the arch of the aorta.  Old healed fracture of right proximal humerus incompletely visualized.  IMPRESSION: 1.  No radiographic evidence of acute cardiopulmonary disease. 2.  Mild cardiomegaly, unchanged. 3.  Atherosclerosis.  Original Report Authenticated By: Florencia Reasons, M.D.   Medications: I have reviewed the patient's current medications. Scheduled Meds:   . amiodarone  200 mg Oral Daily  . amLODipine  5 mg Oral Daily  . carvedilol  6.25 mg Oral BID  . docusate sodium  100 mg Oral BID  . ferrous sulfate  325 mg Oral Q breakfast  . losartan  50 mg Oral Daily  . loteprednol  1 drop Right Eye q morning - 10a  . pantoprazole  20 mg Oral Q1200  . polyethylene  glycol-electrolytes  4,000 mL Oral Once  . simvastatin  5 mg Oral q1800  . sodium chloride  3 mL Intravenous Q12H  . Travoprost (BAK Free)  1 drop Right Eye QHS  . DISCONTD: travoprost (benzalkonium)  1 drop Right Eye QHS   Continuous Infusions:   . sodium chloride 20 mL/hr at 12/12/11 1603   PRN Meds:.acetaminophen, bisacodyl, ondansetron (ZOFRAN) IV, ondansetron Assessment/Plan:   GI bleed - GI has seen and will do colonoscopy on him. His blood counts have been stable and we will continue to watch them daily.    HYPERLIPIDEMIA - Continue on statin. Stable.   Chronic systolic heart failure - Not in state of fluid overload. On carvedilol. Holding ASA and plavix.    SVT (supraventricular tachycardia) - On  carvedilol.  HTN - On carvedilol, amlodipine, losartan.    CAD (coronary artery disease) - With recent history of stent but 1 month out can stop plavix and holding ASA.   Disposition - per GI workup. If CBCs are stable then can possibly be discharged after study tomorrow.     LOS: 1 day   Genella Mech 12/13/2011, 2:46 PM

## 2011-12-13 NOTE — Progress Notes (Signed)
Dr. Madilyn Fireman decided to cancel colonoscopy for tom. He will call pt cardiologist tomorrow to discuss whether pt should get colonoscopy during this admission or to hold off for now. Pt informed that procedure is cancelled for tom. [place], Rosanna Randy

## 2011-12-13 NOTE — Consult Note (Signed)
[facility] Consult Note  Referring Provider: No ref. provider found Primary Care Physician:  Melida Quitter, MD, MD Primary Gastroenterologist:  Dr.  Antony Contras Complaint: Rectal bleeding HPI: Eugene [name] is an 78 y.o. white male.  Presenting with recurrent painless hematochezia. He has had this on multiple occasions and I saw him in 2011 for this but G-tube comorbid conditions and a colonoscopy in 2006 with no high-risk findings we decided to defer. He states he's had intermittent rectal bleeding at his nursing home and yesterday had a lot without any pain. Rectal exam showed maroon stool in the vault with no evidence of any perianal source such as hemorrhoid or fissure. He underwent a recent valvuloplasty last month and was on Plavix but this was discontinued 1 week ago. It is not clear whether he actually stopped getting. He is not having any chest pain or dyspnea currently  Past Medical History  Diagnosis Date  . Glaucoma     Right eye  . Diverticulosis     multiple hospital admissions for GI bleed, has an episode roughly every 6 months,, last  colonscopy in 2006 showed diverticulosis, was evaluated by Dr. Madilyn Fireman in the hospital in 2011 for lower GI bleed andd it  was suggested that the patient was not actively bleeding at that time and given his comorbid conditions colonoscopy was deferred. He is also chronically constipated  . Macular degeneration of right eye   . Allergic rhinitis   . CAD (coronary artery disease)     s/p PCI in 1991;  Cardiac catheterization 11/02/11: Ostial LAD 95% with aneurysmal dilatation after this, proximal circumflex 70% with a superior branch 30-40%, proximal RCA 75% and 90%, mid RCA 50% and 90% before a PDA, then occluded.;    high risk for CABG;PCI 11/14/11: Bare-metal stent to the proximal LAD    . OSA on CPAP   . Hypertension   . Hyperlipidemia   . Chronic systolic heart failure     Echocardiogram 11/02/11: Moderate LVH, EF 30-35%, multiple wall  motion abnormalities, critical aortic stenosis, AVA 0.6, mean gradient 45, mild MR, severe LAE  . Supraventricular tachycardia     2 syndromes-nonsustained atrial tachycardia//adenosine responsive diuretic positive reentry probably AV node reentry  . Aortic stenosis     s/p AV balloon valvuloplasty by Dr. Excell Seltzer 11/2011  . Ischemic cardiomyopathy   . H/O: GI bleed     recurrent  . Heart murmur   . CHF (congestive heart failure)     Past Surgical History  Procedure Date  . Implantation of baerveldt glaucoma device lant with scleral reinforcement using tutoplast tissue graft right eye.   . Coronary angioplasty with stent placement   . Corneal transplant     Medications Prior to Admission  Medication Dose Route Frequency Provider Last Rate Last Dose  . 0.9 %  sodium chloride infusion   Intravenous Continuous Bethel Born, MD 20 mL/hr at 12/12/11 1603    . acetaminophen (TYLENOL) tablet 650 mg  650 mg Oral Q6H PRN Kathreen Cosier, MD   650 mg at 12/13/11 0048  . amiodarone (PACERONE) tablet 200 mg  200 mg Oral Daily Bethel Born, MD   200 mg at 12/12/11 1607  . amLODipine (NORVASC) tablet 5 mg  5 mg Oral Daily Bethel Born, MD   5 mg at 12/12/11 1607  . bisacodyl (DULCOLAX) EC tablet 5 mg  5 mg Oral Daily PRN Bethel Born, MD      . carvedilol (COREG) tablet 6.25 mg  6.25  mg Oral BID Bethel Born, MD   6.25 mg at 12/13/11 0047  . docusate sodium (COLACE) capsule 100 mg  100 mg Oral BID Bethel Born, MD   100 mg at 12/13/11 0048  . ferrous sulfate tablet 325 mg  325 mg Oral Q breakfast Bethel Born, MD      . losartan (COZAAR) tablet 50 mg  50 mg Oral Daily Bethel Born, MD   50 mg at 12/12/11 1607  . loteprednol (LOTEMAX) 0.5 % ophthalmic suspension 1 drop  1 drop Right Eye q morning - 10a Bethel Born, MD   1 drop at 12/12/11 1607  . ondansetron (ZOFRAN) tablet 4 mg  4 mg Oral Q6H PRN Bethel Born, MD       Or  . ondansetron (ZOFRAN) injection 4 mg  4 mg Intravenous Q6H  PRN Bethel Born, MD      . pantoprazole (PROTONIX) EC tablet 20 mg  20 mg Oral Q1200 Bethel Born, MD   20 mg at 12/12/11 1607  . polyethylene glycol-electrolytes (NuLYTELY/GoLYTELY) solution 4,000 mL  4,000 mL Oral Once Barrie Folk, MD      . simvastatin (ZOCOR) tablet 5 mg  5 mg Oral q1800 Bethel Born, MD   5 mg at 12/12/11 1748  . sodium chloride 0.9 % injection 3 mL  3 mL Intravenous Q12H Bethel Born, MD   3 mL at 12/13/11 0047  . Travoprost (BAK Free) (TRAVATAN) 0.004 % ophthalmic solution SOLN 1 drop  1 drop Right Eye QHS Ulyess Mort, MD   1 drop at 12/13/11 0046  . DISCONTD: travoprost (benzalkonium) (TRAVATAN) 0.004 % ophthalmic solution 1 drop  1 drop Right Eye QHS Bethel Born, MD       Medications Prior to Admission  Medication Sig Dispense Refill  . amiodarone (PACERONE) 200 MG tablet Take 200 mg by mouth daily.      Marland Kitchen aspirin 81 MG tablet Take 81 mg by mouth daily.        Marland Kitchen docusate sodium (COLACE) 100 MG capsule Take 100 mg by mouth 2 (two) times daily.       Marland Kitchen losartan (COZAAR) 50 MG tablet Take 50 mg by mouth daily.      Marland Kitchen loteprednol (LOTEMAX) 0.5 % ophthalmic suspension Place 1 drop into the right eye every morning.       . travoprost, benzalkonium, (TRAVATAN) 0.004 % ophthalmic solution Place 1 drop into the right eye at bedtime.          Allergies:  Allergies  Allergen Reactions  . Penicillins     REACTION: "turns red"    History reviewed. No pertinent family history.  Social History:  reports that he quit smoking about 51 years ago. His smoking use included Cigarettes. He has never used smokeless tobacco. He reports that he does not drink alcohol or use illicit drugs.  Negative except as above   Blood pressure 123/52, pulse 65, temperature 98.2 F (36.8 C), temperature source Oral, resp. rate 17, height 5\' 7"  (1.702 m), weight 89.3 kg (196 lb 13.9 oz), SpO2 96.00%. Head: Normocephalic, without obvious abnormality, atraumatic Neck: no adenopathy, no  carotid bruit, no JVD, supple, symmetrical, trachea midline and thyroid not enlarged, symmetric, no tenderness/mass/nodules Resp: clear to auscultation bilaterally Cardio: regular rate and rhythm, S1, S2 normal, no murmur, click, rub or gallop GI: Abdomen soft nondistended with normoactive bowel sounds no hepatomegaly masses or guarding. Extremities: extremities normal, atraumatic, no cyanosis or edema  Results for orders placed during the  hospital encounter of 12/12/11 (from the past 48 hour(s))  OCCULT BLOOD X 1 CARD TO LAB, STOOL     Status: Normal   Collection Time   12/12/11  8:11 AM      Component Value Range Comment   Fecal Occult Bld POSITIVE     CBC     Status: Abnormal   Collection Time   12/12/11  8:25 AM      Component Value Range Comment   WBC 6.5  4.0 - 10.5 (K/uL)    RBC 3.34 (*) 4.22 - 5.81 (MIL/uL)    Hemoglobin 9.7 (*) 13.0 - 17.0 (g/dL)    HCT 19.1 (*) 47.8 - 52.0 (%)    MCV 91.0  78.0 - 100.0 (fL)    MCH 29.0  26.0 - 34.0 (pg)    MCHC 31.9  30.0 - 36.0 (g/dL)    RDW 29.5 (*) 62.1 - 15.5 (%)    Platelets 274  150 - 400 (K/uL)   DIFFERENTIAL     Status: Abnormal   Collection Time   12/12/11  8:25 AM      Component Value Range Comment   Neutrophils Relative 63  43 - 77 (%)    Neutro Abs 4.1  1.7 - 7.7 (K/uL)    Lymphocytes Relative 19  12 - 46 (%)    Lymphs Abs 1.2  0.7 - 4.0 (K/uL)    Monocytes Relative 10  3 - 12 (%)    Monocytes Absolute 0.7  0.1 - 1.0 (K/uL)    Eosinophils Relative 8 (*) 0 - 5 (%)    Eosinophils Absolute 0.5  0.0 - 0.7 (K/uL)    Basophils Relative 1  0 - 1 (%)    Basophils Absolute 0.1  0.0 - 0.1 (K/uL)   COMPREHENSIVE METABOLIC PANEL     Status: Abnormal   Collection Time   12/12/11  8:25 AM      Component Value Range Comment   Sodium 139  135 - 145 (mEq/L)    Potassium 4.5  3.5 - 5.1 (mEq/L)    Chloride 106  96 - 112 (mEq/L)    CO2 24  19 - 32 (mEq/L)    Glucose, Bld 92  70 - 99 (mg/dL)    BUN 31 (*) 6 - 23 (mg/dL)    Creatinine, Ser  3.08  0.50 - 1.35 (mg/dL)    Calcium 9.1  8.4 - 10.5 (mg/dL)    Total Protein 6.1  6.0 - 8.3 (g/dL)    Albumin 3.1 (*) 3.5 - 5.2 (g/dL)    AST 9  0 - 37 (U/L)    ALT 7  0 - 53 (U/L)    Alkaline Phosphatase 107  39 - 117 (U/L)    Total Bilirubin 0.2 (*) 0.3 - 1.2 (mg/dL)    GFR calc non Af Amer 61 (*) >90 (mL/min)    GFR calc Af Amer 71 (*) >90 (mL/min)   PROTIME-INR     Status: Normal   Collection Time   12/12/11  8:25 AM      Component Value Range Comment   Prothrombin Time 13.9  11.6 - 15.2 (seconds)    INR 1.05  0.00 - 1.49    APTT     Status: Normal   Collection Time   12/12/11  8:25 AM      Component Value Range Comment   aPTT 29  24 - 37 (seconds)   TYPE AND SCREEN     Status: Normal  Collection Time   12/12/11  8:25 AM      Component Value Range Comment   ABO/RH(D) O POS      Antibody Screen NEG      Sample Expiration 12/15/2011     URINALYSIS, ROUTINE W REFLEX MICROSCOPIC     Status: Normal   Collection Time   12/12/11 10:03 AM      Component Value Range Comment   Color, Urine YELLOW  YELLOW     APPearance CLEAR  CLEAR     Specific Gravity, Urine 1.019  1.005 - 1.030     pH 6.0  5.0 - 8.0     Glucose, UA NEGATIVE  NEGATIVE (mg/dL)    Hgb urine dipstick NEGATIVE  NEGATIVE     Bilirubin Urine NEGATIVE  NEGATIVE     Ketones, ur NEGATIVE  NEGATIVE (mg/dL)    Protein, ur NEGATIVE  NEGATIVE (mg/dL)    Urobilinogen, UA 0.2  0.0 - 1.0 (mg/dL)    Nitrite NEGATIVE  NEGATIVE     Leukocytes, UA NEGATIVE  NEGATIVE  MICROSCOPIC NOT DONE ON URINES WITH NEGATIVE PROTEIN, BLOOD, LEUKOCYTES, NITRITE, OR GLUCOSE <1000 mg/dL.  CBC     Status: Abnormal   Collection Time   12/12/11  4:53 PM      Component Value Range Comment   WBC 5.6  4.0 - 10.5 (K/uL)    RBC 3.05 (*) 4.22 - 5.81 (MIL/uL)    Hemoglobin 9.0 (*) 13.0 - 17.0 (g/dL)    HCT 40.9 (*) 81.1 - 52.0 (%)    MCV 89.8  78.0 - 100.0 (fL)    MCH 29.5  26.0 - 34.0 (pg)    MCHC 32.8  30.0 - 36.0 (g/dL)    RDW 91.4 (*) 78.2 - 15.5 (%)     Platelets 247  150 - 400 (K/uL)   BASIC METABOLIC PANEL     Status: Abnormal   Collection Time   12/13/11  6:40 AM      Component Value Range Comment   Sodium 138  135 - 145 (mEq/L)    Potassium 4.2  3.5 - 5.1 (mEq/L)    Chloride 107  96 - 112 (mEq/L)    CO2 22  19 - 32 (mEq/L)    Glucose, Bld 103 (*) 70 - 99 (mg/dL)    BUN 22  6 - 23 (mg/dL)    Creatinine, Ser 9.56  0.50 - 1.35 (mg/dL)    Calcium 8.3 (*) 8.4 - 10.5 (mg/dL)    GFR calc non Af Amer 76 (*) >90 (mL/min)    GFR calc Af Amer 88 (*) >90 (mL/min)   CBC     Status: Abnormal   Collection Time   12/13/11  6:40 AM      Component Value Range Comment   WBC 7.9  4.0 - 10.5 (K/uL)    RBC 2.98 (*) 4.22 - 5.81 (MIL/uL)    Hemoglobin 8.8 (*) 13.0 - 17.0 (g/dL)    HCT 21.3 (*) 08.6 - 52.0 (%)    MCV 91.9  78.0 - 100.0 (fL)    MCH 29.5  26.0 - 34.0 (pg)    MCHC 32.1  30.0 - 36.0 (g/dL)    RDW 57.8 (*) 46.9 - 15.5 (%)    Platelets 263  150 - 400 (K/uL)    Dg Chest Port 1 View  12/12/2011  *RADIOLOGY REPORT*  Clinical Data: Chest pain.  Rectal bleeding.  PORTABLE CHEST - 1 VIEW  Comparison: Multiple priors, most recently chest  x-ray 11/11/2011.  Findings: Lung volumes are normal.  No consolidative airspace disease.  No pleural effusions.  Pulmonary vasculature is normal. Mild cardiomegaly (unchanged).  Atherosclerotic calcifications within the arch of the aorta.  Old healed fracture of right proximal humerus incompletely visualized.  IMPRESSION: 1.  No radiographic evidence of acute cardiopulmonary disease. 2.  Mild cardiomegaly, unchanged. 3.  Atherosclerosis.  Original Report Authenticated By: Florencia Reasons, M.D.    Assessment: Recurrent lower GI bleeding, last colonoscopy at least 7 years ago, recently suspect taken off Plavix Plan:  At this point will proceed with colonoscopy as this can be accomplished. I think the patient can tolerate the prep and the sedated procedure. He is willing to proceed and we'll tentatively schedule  this for tomorrow morning. Chadwick Reiswig C 12/13/2011, 9:32 AM

## 2011-12-13 NOTE — Progress Notes (Signed)
Pt daughter, Lupita Leash, has concerns about pt going for colonoscopy in am, due to poor cardiac function and recent stent placement a week ago. Spoke with Dr. Madilyn Fireman concerning daughters concerns and gave him her number to call and discuss the issue. [place], Rosanna Randy

## 2011-12-13 NOTE — Progress Notes (Addendum)
Clinical Child psychotherapist (CSW) covering aware from previous note that pt is from [facility] and the plan has been for pt to return when stable. CSW left a message for [employer], POA to confirm above.This CSW has placed the psychosocial assessment form in pt shadow chart. CSW has also placed pt FL2 in shadow chart to be signed by MD. CSW will continue to follow and facilitate with dc when pt stable.  Pt POA [employer], contacted CSW at 14:59 and informed CSW that POA met with [facility] staff. POA states that pt has been unhappy at facility and POA would like CSW to search for another facility for pt to dc to when stable. POA states he would like placement for pt at [facility]. CSW left a message at [facility] regarding placement availability. CSW informed POA that CSW would fax pt out and follow up with bed offers. POA appreciative of CSW assistance.  Theresia Bough, MSW, Theresia Majors [phone]

## 2011-12-13 NOTE — Progress Notes (Signed)
Tele  Readings  SR-now  SB  RATE  46 DOWN  TO  33   PT  ASYMPTOMATIC      NORMALLY  WEARS  CPAP  @[facility]    BUT  NOT  WANTING  TO  WEAR  OURS.   PT  AWAKENED  WITH  LOW  HEARTRATE AND  THEN  RETURNS TO  50/S   MD NOTIFIED  OF  THE  ABOVE WILL  CONTINUE  TO  MONITOR

## 2011-12-14 ENCOUNTER — Encounter (HOSPITAL_COMMUNITY)
Admission: EM | Disposition: A | Discharge status: Skilled Nursing Facility | Payer: Self-pay | Source: Ambulatory Visit | Attending: Internal Medicine

## 2011-12-14 DIAGNOSIS — I359 Nonrheumatic aortic valve disorder, unspecified: Secondary | ICD-10-CM

## 2011-12-14 LAB — CBC
HCT: 29 % — ABNORMAL LOW (ref 39.0–52.0)
Hemoglobin: 9.2 g/dL — ABNORMAL LOW (ref 13.0–17.0)
WBC: 6.3 10*3/uL (ref 4.0–10.5)

## 2011-12-14 SURGERY — COLONOSCOPY
Anesthesia: Moderate Sedation

## 2011-12-14 MED ORDER — AMIODARONE HCL 100 MG PO TABS
100.0000 mg | ORAL_TABLET | Freq: Every day | ORAL | Status: DC
  Administered 2011-12-14 – 2011-12-17 (×4): 100 mg via ORAL
  Filled 2011-12-14 (×5): qty 1

## 2011-12-14 NOTE — Progress Notes (Signed)
Subjective: The patient is very pleasant and has not had any repeat episodes of blood per rectum. He is aware that he is not getting colonoscopy today and talked to him about the fact that he may not need it and he seemed to be in agreement. Otherwise he is feeling well. No SOB, no chest pain, no abdominal pain. No leg swelling. No nausea or vomiting.   Objective: Vital signs in last 24 hours: Filed Vitals:   12/13/11 1325 12/13/11 1731 12/13/11 2042 12/14/11 0440  BP: 101/51 140/65 127/79 151/56  Pulse: 60 59 59 59  Temp: 97.1 F (36.2 C) 98.2 F (36.8 C) 97.9 F (36.6 C) 97.4 F (36.3 C)  TempSrc: Oral Oral Oral Oral  Resp: 18 18 19 18   Height:   5\' 7"  (1.702 m)   Weight:   195 lb 14.4 oz (88.86 kg)   SpO2:  95% 96% 94%   Weight change: -15.5 oz (-0.44 kg)  Intake/Output Summary (Last 24 hours) at 12/14/11 1610 Last data filed at 12/13/11 1300  Gross per 24 hour  Intake   1200 ml  Output    250 ml  Net    950 ml   Physical Exam: General: resting in bed,pleasant, talkative HEENT: PERRL, EOMI, no scleral icterus Cardiac: RRR, no rubs, murmurs or gallops Pulm: clear to auscultation bilaterally, moving normal volumes of air Abd: soft, nontender, nondistended, BS present Ext: warm and well perfused, no pedal edema Neuro: alert and oriented X3, cranial nerves II-XII grossly intact  Lab Results: Basic Metabolic Panel:  Lab 12/13/11 9604 12/12/11 0825  NA 138 139  K 4.2 4.5  CL 107 106  CO2 22 24  GLUCOSE 103* 92  BUN 22 31*  CREATININE 0.89 1.07  CALCIUM 8.3* 9.1  MG -- --  PHOS -- --   Liver Function Tests:  Lab 12/12/11 0825  AST 9  ALT 7  ALKPHOS 107  BILITOT 0.2*  PROT 6.1  ALBUMIN 3.1*   CBC:  Lab 12/14/11 0550 12/13/11 1654 12/12/11 0825  WBC 6.3 7.2 --  NEUTROABS -- -- 4.1  HGB 9.2* 8.6* --  HCT 29.0* 27.1* --  MCV 91.5 90.9 --  PLT 269 288 --   Coagulation:  Lab 12/12/11 0825  LABPROT 13.9  INR 1.05   Urinalysis:  Lab 12/12/11 1003    COLORURINE YELLOW  LABSPEC 1.019  PHURINE 6.0  GLUCOSEU NEGATIVE  HGBUR NEGATIVE  BILIRUBINUR NEGATIVE  KETONESUR NEGATIVE  PROTEINUR NEGATIVE  UROBILINOGEN 0.2  NITRITE NEGATIVE  LEUKOCYTESUR NEGATIVE   Studies/Results: Dg Chest Port 1 View  12/12/2011  *RADIOLOGY REPORT*  Clinical Data: Chest pain.  Rectal bleeding.  PORTABLE CHEST - 1 VIEW  Comparison: Multiple priors, most recently chest x-ray 11/11/2011.  Findings: Lung volumes are normal.  No consolidative airspace disease.  No pleural effusions.  Pulmonary vasculature is normal. Mild cardiomegaly (unchanged).  Atherosclerotic calcifications within the arch of the aorta.  Old healed fracture of right proximal humerus incompletely visualized.  IMPRESSION: 1.  No radiographic evidence of acute cardiopulmonary disease. 2.  Mild cardiomegaly, unchanged. 3.  Atherosclerosis.  Original Report Authenticated By: Florencia Reasons, M.D.   Medications: I have reviewed the patient's current medications. Scheduled Meds:    . amiodarone  200 mg Oral Daily  . amLODipine  5 mg Oral Daily  . carvedilol  6.25 mg Oral BID  . docusate sodium  100 mg Oral BID  . ferrous sulfate  325 mg Oral Q breakfast  .  losartan  50 mg Oral Daily  . loteprednol  1 drop Right Eye q morning - 10a  . pantoprazole  20 mg Oral Q1200  . polyethylene glycol-electrolytes  4,000 mL Oral Once  . simvastatin  5 mg Oral q1800  . sodium chloride  3 mL Intravenous Q12H  . Travoprost (BAK Free)  1 drop Right Eye QHS   Continuous Infusions:    . sodium chloride 20 mL/hr at 12/12/11 1603   PRN Meds:.acetaminophen, bisacodyl, ondansetron (ZOFRAN) IV, ondansetron Assessment/Plan:   GI bleed - GI has seen and will call his cardiologist today but has temporarily cancelled his colonoscopy. His blood counts have been stable and we will continue to watch them daily. If he does not appear to emergently need colonoscopy then he may be stable for discharge today.     HYPERLIPIDEMIA - Continue on statin. Stable.   Chronic systolic heart failure - Not in state of fluid overload. On carvedilol. Holding ASA and plavix.    SVT (supraventricular tachycardia) - On carvedilol.  HTN - On carvedilol, amlodipine, losartan.    CAD (coronary artery disease) - With recent history of stent but 1 month out can stop plavix and holding ASA.   Disposition - If colonoscopy then he will stay for that. If not he is stable to be discharged to SNF although he has talked to pastor [facility]) and per SW note he would like to go somewhere else. Have signed FL-2 form. Will likely not be able to go to new SNF until Monday so we will keep him here until then.     LOS: 2 days   Genella Mech 12/14/2011, 8:12 AM

## 2011-12-14 NOTE — Progress Notes (Signed)
Placed patient on CPAP for the night at 8cm. Sp02=94%. Patient instructed on how to remove mask if he felt like he might vomit. Patient stated the pressure felt like the pressure from the CPAP at home.

## 2011-12-14 NOTE — Progress Notes (Signed)
Hgb stable.  No further clinical bleeding.  Given his age and especially his severe heart disease, I agree with forgoing colonoscopy.  This episode is perfectly compatible with recurrent diverticular bleeding.

## 2011-12-14 NOTE — Progress Notes (Signed)
Clinical Social Work-CSW provided pt POA with alternative bed offers in preparation for d/c Monday. POA/Pastor is to visit facilities and contact CSW with decision Monday AM-Rithy Mandley-MSW, [phone]

## 2011-12-14 NOTE — Consult Note (Signed)
This patient is well-known to me. I was asked to see him to weigh-in on his cardiac risk of undergoing colonoscopy. I spoke with Dr Madilyn Fireman this morning. The patient recently underwent balloon aortic valvuloplasty and PCI of the proximal LAD with a bare metal stent (10/31/11). He has been admitted now with lower GI bleeding on 2 different occasions. He is off of plavix and ASA has been stopped this admission. He actually is making very good progress at [facility] per his report. He tells me he is walking and doing low-level exercises without exertional symptoms. He specifically denies chest pain, dyspnea, edema, or syncope.   I have reviewed notes from the IM service. I agree that conservative care is appropriate, especially if he has no further bleeding off ASA. However, if he has recurrent bleeding I think he would be at low cardiac risk of undergoing colonoscopy. In fact, he seems to be very stable from a cardiac perspective at this time. He will follow-up with Dr Antoine Poche as scheduled 3/26. I would not change any of his cardiac meds at this time.   Please feel free to call if any CV problems arise.  Eugene [name] 12/14/2011 12:17 PM

## 2011-12-14 NOTE — Progress Notes (Signed)
RN notified by telemetry monitor that HR in 30's. Patient asleep upon entering room HR back up to 60's per telemetry monitor. Respiratory notified regarding CPAP. They are still looking for a CPAP secondary to shortage. Patient states that his POA is supposed to bring one in. Charge RN  Stated that patient had BM and moderate amount of dark blood present. MD paged, awaiting response.

## 2011-12-14 NOTE — Progress Notes (Signed)
Patient had one incontinence episode.  A medium amount of soft, brown bowel movement with traces of bright red blood.  Abdomen is soft, non-tender with hyperactive bowel sounds throughout.  CPAP request is made to respiratory therapy for him to use tonight.  Patient has no complaints at this time.  Will continue to monitor.

## 2011-12-14 NOTE — Progress Notes (Signed)
MD made aware of previous findings. No new orders received.

## 2011-12-14 NOTE — Progress Notes (Signed)
Patient had small bowel movement with dark blood present, MD paged and made aware.

## 2011-12-15 LAB — CBC
HCT: 28 % — ABNORMAL LOW (ref 39.0–52.0)
Hemoglobin: 8.9 g/dL — ABNORMAL LOW (ref 13.0–17.0)
MCH: 29.2 pg (ref 26.0–34.0)
MCV: 91.8 fL (ref 78.0–100.0)
RBC: 3.05 MIL/uL — ABNORMAL LOW (ref 4.22–5.81)

## 2011-12-15 NOTE — Progress Notes (Signed)
Dr. Dorthula Rue called re: pt request for walker and PT eval. prior to d/c to SNF on Mondfay. Orders received.

## 2011-12-15 NOTE — Progress Notes (Signed)
Physical Therapy Evaluation Patient Details Name: SHAYDON LEASE MRN: [id number] DOB: [birth year] Today's Date: 12/15/2011  Problem List:  Patient Active Problem List  Diagnoses  . HYPERLIPIDEMIA  . MACULAR DEGENERATION, RIGHT EYE  . GLAUCOMA, RIGHT EYE  . Essential hypertension, benign  . ALLERGIC RHINITIS DUE TO POLLEN  . DIVERTICULOSIS, COLON  . Chronic venous insufficiency  . Chronic systolic heart failure  . Bradycardia   . Blood in stool, frank  . SVT (supraventricular tachycardia)  . CAD (coronary artery disease)  . Shortness of breath  . Preventative health care  . Dizziness  . Syncope and collapse  . Aortic stenosis  . GI bleed    Past Medical History:  Past Medical History  Diagnosis Date  . Glaucoma     Right eye  . Diverticulosis     multiple hospital admissions for GI bleed, has an episode roughly every 6 months,, last  colonscopy in 2006 showed diverticulosis, was evaluated by Dr. Madilyn Fireman in the hospital in 2011 for lower GI bleed andd it  was suggested that the patient was not actively bleeding at that time and given his comorbid conditions colonoscopy was deferred. He is also chronically constipated  . Macular degeneration of right eye   . Allergic rhinitis   . CAD (coronary artery disease)     s/p PCI in 1991;  Cardiac catheterization 11/02/11: Ostial LAD 95% with aneurysmal dilatation after this, proximal circumflex 70% with a superior branch 30-40%, proximal RCA 75% and 90%, mid RCA 50% and 90% before a PDA, then occluded.;    high risk for CABG;PCI 11/14/11: Bare-metal stent to the proximal LAD    . OSA on CPAP   . Hypertension   . Hyperlipidemia   . Chronic systolic heart failure     Echocardiogram 11/02/11: Moderate LVH, EF 30-35%, multiple wall motion abnormalities, critical aortic stenosis, AVA 0.6, mean gradient 45, mild MR, severe LAE  . Supraventricular tachycardia     2 syndromes-nonsustained atrial tachycardia//adenosine responsive diuretic positive  reentry probably AV node reentry  . Aortic stenosis     s/p AV balloon valvuloplasty by Dr. Excell Seltzer 11/2011  . Ischemic cardiomyopathy   . H/O: GI bleed     recurrent  . Heart murmur   . CHF (congestive heart failure)    Past Surgical History:  Past Surgical History  Procedure Date  . Implantation of baerveldt glaucoma device lant with scleral reinforcement using tutoplast tissue graft right eye.   . Coronary angioplasty with stent placement   . Corneal transplant     PT Assessment/Plan/Recommendation PT Assessment Clinical Impression Statement: pt presents with GIB.  pt notes multiple recent admits and doing rehab at [facility].  pt states he wants to return to his own home with his friend to A daily, but ? safety of this.  pt would benefit from SNF to continue rehab and with possibility of long term placement as pt not safe for return home with only daily help from another elderly man.   PT Recommendation/Assessment: Patient will need skilled PT in the acute care venue PT Problem List: Decreased strength;Decreased activity tolerance;Decreased balance;Decreased mobility;Decreased knowledge of use of DME;Cardiopulmonary status limiting activity Barriers to Discharge: Decreased caregiver support;Inaccessible home environment Barriers to Discharge Comments: pt lives in multi-level home with 5 steps to enter, another 5 steps to livingroom, then another 5 steps to bedroom and bathroom.  pt's CG is another elderly gentleman who was present when this PT arrived to pt's room.   PT  Therapy Diagnosis : Difficulty walking PT Plan PT Frequency: Min 3X/week PT Treatment/Interventions: DME instruction;Gait training;Stair training;Functional mobility training;Therapeutic activities;Therapeutic exercise;Balance training;Patient/family education PT Recommendation Recommendations for Other Services: OT consult Follow Up Recommendations: Skilled nursing facility Equipment Recommended: Defer to next  venue PT Goals  Acute Rehab PT Goals PT Goal Formulation: With patient Time For Goal Achievement: 2 weeks Pt will go Supine/Side to Sit: Independently PT Goal: Supine/Side to Sit - Progress: Goal set today Pt will go Sit to Supine/Side: Independently PT Goal: Sit to Supine/Side - Progress: Goal set today Pt will go Sit to Stand: with modified independence;with upper extremity assist PT Goal: Sit to Stand - Progress: Goal set today Pt will go Stand to Sit: with modified independence;with upper extremity assist PT Goal: Stand to Sit - Progress: Goal set today Pt will Ambulate: >150 feet;with modified independence;with rolling walker PT Goal: Ambulate - Progress: Goal set today Pt will Go Up / Down Stairs: 3-5 stairs;with supervision;with rail(s) PT Goal: Up/Down Stairs - Progress: Goal set today Pt will Perform Home Exercise Program: Independently PT Goal: Perform Home Exercise Program - Progress: Goal set today  PT Evaluation Precautions/Restrictions  Precautions Precautions: Fall Restrictions Weight Bearing Restrictions: No Prior Functioning  Home Living Lives With: Alone Receives Help From: Friend(s) Type of Home: House Home Layout: Multi-level Alternate Level Stairs-Rails: Left Alternate Level Stairs-Number of Steps: 5 Home Access: Stairs to enter Entrance Stairs-Rails: Left Entrance Stairs-Number of Steps: 5 Bathroom Shower/Tub: Health visitor: Handicapped height Bathroom Accessibility: Yes How Accessible: Accessible via walker Home Adaptive Equipment: Quad cane;Walker - rolling Additional Comments: pt currently staying at SNF and states he wants to return to his home from here and not return to SNF.  pt has friend that spends "half the day" with him and helps with errands and chores.   Prior Function Level of Independence: Independent with basic ADLs;Independent with transfers;Needs assistance with homemaking;Requires assistive device for  independence Cognition Cognition Orientation Level: Oriented X4 Sensation/Coordination   Extremity Assessment RLE Assessment RLE Assessment: Within Functional Limits LLE Assessment LLE Assessment: Within Functional Limits Mobility (including Balance) Bed Mobility Bed Mobility: No Transfers Transfers: Yes Sit to Stand: 5: Supervision;With upper extremity assist;With armrests;From chair/3-in-1 Sit to Stand Details (indicate cue type and reason): cues to push up on armrests as pt tends to grab for RW Stand to Sit: 5: Supervision;To chair/3-in-1 Stand to Sit Details: pt tends to throw himself into chair despite cues to get closer to recliner, use armrests and control descent.   Ambulation/Gait Ambulation/Gait: Yes Ambulation/Gait Assistance: 4: Min assist Ambulation/Gait Assistance Details (indicate cue type and reason): cues for positioning in RW, upright posture Ambulation Distance (Feet): 200 Feet Assistive device: Rolling walker Gait Pattern: Step-through pattern;Decreased stride length;Trunk flexed Stairs: No Wheelchair Mobility Wheelchair Mobility: No  Posture/Postural Control Posture/Postural Control: No significant limitations Balance Balance Assessed: No Exercise    End of Session PT - End of Session Equipment Utilized During Treatment: Gait belt Activity Tolerance: Patient tolerated treatment well Patient left: in chair;with call bell in reach Nurse Communication: Mobility status for transfers;Mobility status for ambulation General Behavior During Session: [facility] for tasks performed Cognition: [facility] for tasks performed  Sunny Schlein, [place] [phone] 12/15/2011, 1:57 PM

## 2011-12-15 NOTE — Progress Notes (Signed)
Subjective: He denies any bloody bowel movements. Patient was in great spirits this AM , sitting in the chair, watching Tv and enjoying breakfast.  Objective: Vital signs in last 24 hours: Filed Vitals:   12/14/11 1000 12/14/11 1800 12/14/11 2147 12/15/11 0620  BP: 96/68 133/72 146/75 122/72  Pulse: 83 63 84 67  Temp: 97.6 F (36.4 C) 97.8 F (36.6 C) 98.6 F (37 C) 97.5 F (36.4 C)  TempSrc: Oral Oral Oral Oral  Resp: 18 18 18 15   Height:      Weight:      SpO2: 92% 94% 94% 94%   Weight change:   Intake/Output Summary (Last 24 hours) at 12/15/11 1610 Last data filed at 12/14/11 2100  Gross per 24 hour  Intake    720 ml  Output    975 ml  Net   -255 ml   Physical Exam: General: resting in bed,pleasant, talkative HEENT: PERRL, EOMI, no scleral icterus Cardiac: RRR, no rubs, murmurs or gallops Pulm: clear to auscultation bilaterally, moving normal volumes of air Abd: soft, nontender, nondistended, BS present Ext: warm and well perfused, no pedal edema Neuro: alert and oriented X3, cranial nerves II-XII grossly intact  Lab Results: Basic Metabolic Panel:  Lab 12/13/11 9604 12/12/11 0825  NA 138 139  K 4.2 4.5  CL 107 106  CO2 22 24  GLUCOSE 103* 92  BUN 22 31*  CREATININE 0.89 1.07  CALCIUM 8.3* 9.1  MG -- --  PHOS -- --   Liver Function Tests:  Lab 12/12/11 0825  AST 9  ALT 7  ALKPHOS 107  BILITOT 0.2*  PROT 6.1  ALBUMIN 3.1*   CBC:  Lab 12/14/11 0550 12/13/11 1654 12/12/11 0825  WBC 6.3 7.2 --  NEUTROABS -- -- 4.1  HGB 9.2* 8.6* --  HCT 29.0* 27.1* --  MCV 91.5 90.9 --  PLT 269 288 --   Coagulation:  Lab 12/12/11 0825  LABPROT 13.9  INR 1.05   Urinalysis:  Lab 12/12/11 1003  COLORURINE YELLOW  LABSPEC 1.019  PHURINE 6.0  GLUCOSEU NEGATIVE  HGBUR NEGATIVE  BILIRUBINUR NEGATIVE  KETONESUR NEGATIVE  PROTEINUR NEGATIVE  UROBILINOGEN 0.2  NITRITE NEGATIVE  LEUKOCYTESUR NEGATIVE   Studies/Results: No results found. Medications:  I have reviewed the patient's current medications. Scheduled Meds:    . amiodarone  100 mg Oral Daily  . amLODipine  5 mg Oral Daily  . carvedilol  6.25 mg Oral BID  . docusate sodium  100 mg Oral BID  . ferrous sulfate  325 mg Oral Q breakfast  . losartan  50 mg Oral Daily  . loteprednol  1 drop Right Eye q morning - 10a  . pantoprazole  20 mg Oral Q1200  . polyethylene glycol-electrolytes  4,000 mL Oral Once  . simvastatin  5 mg Oral q1800  . sodium chloride  3 mL Intravenous Q12H  . Travoprost (BAK Free)  1 drop Right Eye QHS  . DISCONTD: amiodarone  200 mg Oral Daily   Continuous Infusions:    . sodium chloride 20 mL/hr at 12/12/11 1603   PRN Meds:.acetaminophen, bisacodyl, ondansetron (ZOFRAN) IV, ondansetron Assessment/Plan:   1. GI bleed - Likely diverticular. ( last colonoscopy in 2006). He denies any further episodes of bloody bowel movements. His H and H are stable. Appreciate GI and cards inputs! Dr. Excell Seltzer 's recommendations are greatly appreciated. In the setting of stable blood counts, would defer colonoscopy for this admission. - Defer colonoscopy  - Continue to monitor  CBC. - Hold ASA for now, would restart at d/c.   2. HYPERLIPIDEMIA - Continue on statin. Stable.   3. Chronic systolic heart failure - Not in state of fluid overload.  - continue coreg and losartan.   4.CAD (coronary artery disease) - With recent history of bare metal stent about 1 month ago and GI bleed, his plavix was d/c at his cardiologist appointment on 3/5. Stable at this time. Denies any chest pain. - continue to hold plavix.   5. HTN - BP stable. On carvedilol, amlodipine, losartan.   6.Aortic stenosis Doing well status post aortic valve balloon valvuloplasty  7. Disposition - D/C to SNF when bed is available.   LOS: 3 days   Eugene [name] 12/15/2011, 7:02 AM

## 2011-12-16 MED ORDER — FUROSEMIDE 40 MG PO TABS
40.0000 mg | ORAL_TABLET | Freq: Every day | ORAL | Status: DC
  Administered 2011-12-16 – 2011-12-17 (×2): 40 mg via ORAL
  Filled 2011-12-16 (×3): qty 1

## 2011-12-16 NOTE — Discharge Summary (Signed)
Internal Medicine Teaching [facility] Discharge Note  Name: Eugene [name] MRN: [id number] DOB: 07-07-1924 78 y.o.  Date of Admission: 12/12/2011  7:53 AM Date of Discharge: 12/17/2011 Attending Physician: Ulyess Mort, MD  Discharge Diagnosis: Principal Problem:  *GI bleed Active Problems:  HYPERLIPIDEMIA  Chronic systolic heart failure  SVT (supraventricular tachycardia)  CAD (coronary artery disease)   Discharge Medications: Medication List  As of 12/17/2011 10:12 AM   STOP taking these medications         clopidogrel 75 MG tablet         TAKE these medications         amiodarone 200 MG tablet   Commonly known as: PACERONE   Take 200 mg by mouth daily.      amLODipine 5 MG tablet   Commonly known as: NORVASC   Take 5 mg by mouth daily.      aspirin 81 MG tablet   Take 81 mg by mouth daily.      carvedilol 6.25 MG tablet   Commonly known as: COREG   Take 6.25 mg by mouth 2 (two) times daily.      docusate sodium 100 MG capsule   Commonly known as: COLACE   Take 100 mg by mouth 2 (two) times daily.      ferrous sulfate 325 (65 FE) MG tablet   Take 325 mg by mouth daily with breakfast.      furosemide 40 MG tablet   Commonly known as: LASIX   Take 40 mg by mouth daily.      lansoprazole 30 MG capsule   Commonly known as: PREVACID   Take 30 mg by mouth daily at 12 noon.      losartan 50 MG tablet   Commonly known as: COZAAR   Take 50 mg by mouth daily.      loteprednol 0.5 % ophthalmic suspension   Commonly known as: LOTEMAX   Place 1 drop into the right eye every morning.      magnesium hydroxide 400 MG/5ML suspension   Commonly known as: MILK OF MAGNESIA   Take 30 mLs by mouth daily as needed. For constipation      pravastatin 40 MG tablet   Commonly known as: PRAVACHOL   Take 40 mg by mouth every evening.      spironolactone 25 MG tablet   Commonly known as: ALDACTONE   Take 12.5 mg by mouth every morning.      travoprost  (benzalkonium) 0.004 % ophthalmic solution   Commonly known as: TRAVATAN   Place 1 drop into the right eye at bedtime.            Disposition and follow-up:   Eugene [name] was discharged from [facility] in Stable condition.    Follow-up Appointments: Follow-up Information    Call Melida Quitter, MD. (Call for an appointment as needed.)    Contact information:   [phone].        Discharge Orders    Future Appointments: Provider: Department: Dept Phone: Center:   01/01/2012 11:15 AM Rollene Rotunda, MD [facility] [phone] [id number]     Future Orders Please Complete By Expires   Diet - low sodium heart healthy      Increase activity slowly      Call MD for:      Scheduling Instructions:   Blood in stool or active signs of bleeding.   (HEART FAILURE PATIENTS) Call MD:  Anytime you have any  of the following symptoms: 1) 3 pound weight gain in 24 hours or 5 pounds in 1 week 2) shortness of breath, with or without a dry hacking cough 3) swelling in the hands, feet or stomach 4) if you have to sleep on extra pillows at night in order to breathe.      Discharge instructions      Comments:   Do NOT take Plavix.      Consultations: Treatment Team:  Barrie Folk, MD  Procedures Performed:  Dg Chest Port 1 View  12/12/2011  *RADIOLOGY REPORT*  Clinical Data: Chest pain.  Rectal bleeding.  PORTABLE CHEST - 1 VIEW  Comparison: Multiple priors, most recently chest x-ray 11/11/2011.  Findings: Lung volumes are normal.  No consolidative airspace disease.  No pleural effusions.  Pulmonary vasculature is normal. Mild cardiomegaly (unchanged).  Atherosclerotic calcifications within the arch of the aorta.  Old healed fracture of right proximal humerus incompletely visualized.  IMPRESSION: 1.  No radiographic evidence of acute cardiopulmonary disease. 2.  Mild cardiomegaly, unchanged. 3.  Atherosclerosis.  Original Report Authenticated By: Florencia Reasons,  M.D.   Admission HPI:  Patient is a 78 y.o. male with a PMHx of coronary artery disease status post CABG and recent PTCA and frequent hospital admission for GI bleed probably secondary to diverticulosis (last colonoscopy in 2006) who presents to [facility] for evaluation of bright red blood in stool early this morning. Patient was completely fine yesterday. This morning when he had a bowel movement , he saw a lot of blood in the toilet bowl. He did not have any abdominal pain or rectal pain prior to the onset of symptoms. He has chronic constipation which had gotten worse over the weekend before the onset of the symptoms. He uses milk of magnesia at home which did not help much. He also has been feeling dizzy on trying to get up this morning. He is on aspirin and Plavix for his coronary artery disease. He was seen by his cardiologist last week at which time Plavix was stopped but he probably is still getting it from his nursing home. He denies any other complaints like chest pain, headaches, focal neurological deficits, falls or syncope. He had similar episode multiple times in the past which generally resolves on its own. He has not seen a stomach doctor since 2006 which was the last time he had a colonoscopy.   Hospital Course by problem list:  GI bleed - Pt was having bright red blood per rectum prior to admission and got CBCs which were consistent with previous values. GI was consulted and scheduled colonoscopy. However, he was a high risk person and they obtained cardiology consult to determine whether the prep for colonoscopy would adversely impact his health. However, his CBCs were stable during this time period and it was thought that we had good explanation of diverticulosis and the bleeding appeared to have stopped and so colonoscopy was deferred during this admission. The colonoscopy would not alter our management and the only intervention for diverticulosis that is not currently bleeding would be  surgical resection for which this patient is not a good candidate. Therefore he did not undergo colonoscopy and had a couple of bowel movements with spots of blood. During this time his hemoglobins were stable and at around the baseline for the patient. He was safely discharged back to SNF although his HCPOA (his pastor) did wish to switch SNF so bed options were presented to him and he choose to  go back to the SNF he came from.   HYPERLIPIDEMIA - Stable during this admission and he was continued on his pravastatin.   Chronic systolic heart failure - Stable during this hospitalization. Originally his diuretics were held and fluid resuscitation was given then he was normal saline locked and did not experience any fluid overload state. He was discharged on his home medications and no changes were made to his regimen during this hospital stay.    SVT (supraventricular tachycardia) - Pt was resumed on his carvedilol for his SVT and did not have any tachyarthmia during this hospitalization.    CAD (coronary artery disease) - Patient did have recent stenting approximately 1 month ago and has cardiology approval to stop plavix so will not be discharged on it as it could increase his risk of bleeding. He will be restarted on ASA upon discharge. No active chest pain during this hospitalization.     Discharge Vitals:  BP 142/61  Pulse 58  Temp(Src) 97.6 F (36.4 C) (Oral)  Resp 18  Ht 5\' 7"  (1.702 m)  Wt 199 lb (90.266 kg)  BMI 31.17 kg/m2  SpO2 95%  Discharge Labs:  Results for orders placed during the hospital encounter of 12/12/11 (from the past 24 hour(s))  CBC     Status: Abnormal   Collection Time   12/17/11  7:00 AM      Component Value Range   WBC 5.4  4.0 - 10.5 (K/uL)   RBC 2.89 (*) 4.22 - 5.81 (MIL/uL)   Hemoglobin 8.3 (*) 13.0 - 17.0 (g/dL)   HCT 45.4 (*) 09.8 - 52.0 (%)   MCV 92.0  78.0 - 100.0 (fL)   MCH 28.7  26.0 - 34.0 (pg)   MCHC 31.2  30.0 - 36.0 (g/dL)   RDW 11.9 (*) 14.7 -  15.5 (%)   Platelets 312  150 - 400 (K/uL)    Signed: Genella Mech 12/17/2011, 10:12 AM

## 2011-12-16 NOTE — Progress Notes (Signed)
Pt placed on CPAP at previous settings. Pt tolerating well. RT will continue to monitor.

## 2011-12-16 NOTE — Progress Notes (Signed)
Subjective: When I walk in the patient using sleeping and using his CPAP machine. He is very pleasant. No pain anywhere and says he is doing well today. No chest pain. States he had a bowel movement last night that was normal or perhaps a little dark. No fevers, no chills. Was up in the chair yesterday.   Objective: Vital signs in last 24 hours: Filed Vitals:   12/15/11 1800 12/15/11 2217 12/15/11 2230 12/16/11 0701  BP: 114/68 109/55  132/57  Pulse: 58 53  66  Temp: 97.8 F (36.6 C) 97.7 F (36.5 C)  98.4 F (36.9 C)  TempSrc: Oral Oral  Oral  Resp: 19 20 18 18   Height:      Weight:  198 lb (89.812 kg)    SpO2: 93% 95%  97%   Weight change:   Intake/Output Summary (Last 24 hours) at 12/16/11 0829 Last data filed at 12/15/11 2300  Gross per 24 hour  Intake    960 ml  Output    475 ml  Net    485 ml   Physical Exam: General: resting in bed,pleasant, talkative, asleep when I enter but easily arousable HEENT: PERRL, EOMI, no scleral icterus Cardiac: RRR, no rubs, murmurs or gallops Pulm: clear to auscultation bilaterally, moving normal volumes of air Abd: soft, nontender, nondistended, BS present, obese Ext: warm and well perfused, no pedal edema Neuro: alert and oriented X3, cranial nerves II-XII grossly intact  Lab Results: Basic Metabolic Panel:  Lab 12/13/11 8657 12/12/11 0825  NA 138 139  K 4.2 4.5  CL 107 106  CO2 22 24  GLUCOSE 103* 92  BUN 22 31*  CREATININE 0.89 1.07  CALCIUM 8.3* 9.1  MG -- --  PHOS -- --   Liver Function Tests:  Lab 12/12/11 0825  AST 9  ALT 7  ALKPHOS 107  BILITOT 0.2*  PROT 6.1  ALBUMIN 3.1*   CBC:  Lab 12/15/11 0825 12/14/11 0550 12/12/11 0825  WBC 6.9 6.3 --  NEUTROABS -- -- 4.1  HGB 8.9* 9.2* --  HCT 28.0* 29.0* --  MCV 91.8 91.5 --  PLT 300 269 --   Coagulation:  Lab 12/12/11 0825  LABPROT 13.9  INR 1.05   Urinalysis:  Lab 12/12/11 1003  COLORURINE YELLOW  LABSPEC 1.019  PHURINE 6.0  GLUCOSEU NEGATIVE    HGBUR NEGATIVE  BILIRUBINUR NEGATIVE  KETONESUR NEGATIVE  PROTEINUR NEGATIVE  UROBILINOGEN 0.2  NITRITE NEGATIVE  LEUKOCYTESUR NEGATIVE   Studies/Results: No results found. Medications: I have reviewed the patient's current medications. Scheduled Meds:    . amiodarone  100 mg Oral Daily  . amLODipine  5 mg Oral Daily  . carvedilol  6.25 mg Oral BID  . docusate sodium  100 mg Oral BID  . ferrous sulfate  325 mg Oral Q breakfast  . losartan  50 mg Oral Daily  . loteprednol  1 drop Right Eye q morning - 10a  . pantoprazole  20 mg Oral Q1200  . polyethylene glycol-electrolytes  4,000 mL Oral Once  . simvastatin  5 mg Oral q1800  . sodium chloride  3 mL Intravenous Q12H  . Travoprost (BAK Free)  1 drop Right Eye QHS   Continuous Infusions:    . sodium chloride 20 mL/hr at 12/12/11 1603   PRN Meds:.acetaminophen, bisacodyl, ondansetron (ZOFRAN) IV, ondansetron Assessment/Plan:   GI bleed - Stable CBCs. Will not check tomorrow unless patient has repeat episode of bloody bowel movement. No colonoscopy this admission however he  does have cardiac clearance if he needs one in the future. Stable.    HYPERLIPIDEMIA - Continue on statin. Stable.   Chronic systolic heart failure - Not in state of fluid overload. On carvedilol. Holding ASA and plavix. Will not discharge on plavix but plan to restart ASA at discharge.    SVT (supraventricular tachycardia) - On carvedilol.  HTN - On carvedilol, amlodipine, losartan.    CAD (coronary artery disease) - With recent history of stent but 1 month out can stop plavix and holding ASA.   Disposition - His HCPOA (pastor) will decide on SNF Monday morning and will likely be discharged to that facility on Monday. Otherwise medically stable.     LOS: 4 days   Genella Mech 12/16/2011, 8:29 AM

## 2011-12-17 LAB — CBC
HCT: 26.6 % — ABNORMAL LOW (ref 39.0–52.0)
MCHC: 31.2 g/dL (ref 30.0–36.0)
MCV: 92 fL (ref 78.0–100.0)
RDW: 16.1 % — ABNORMAL HIGH (ref 11.5–15.5)

## 2011-12-17 NOTE — Evaluation (Signed)
Occupational Therapy Evaluation and Discharge Patient Details Name: Eugene [name] MRN: [id number] DOB: [birth year] Today's Date: 12/17/2011  Problem List:  Patient Active Problem List  Diagnoses  . HYPERLIPIDEMIA  . MACULAR DEGENERATION, RIGHT EYE  . GLAUCOMA, RIGHT EYE  . Essential hypertension, benign  . ALLERGIC RHINITIS DUE TO POLLEN  . DIVERTICULOSIS, COLON  . Chronic venous insufficiency  . Chronic systolic heart failure  . Bradycardia   . Blood in stool, frank  . SVT (supraventricular tachycardia)  . CAD (coronary artery disease)  . Shortness of breath  . Preventative health care  . Dizziness  . Syncope and collapse  . Aortic stenosis  . GI bleed    Past Medical History:  Past Medical History  Diagnosis Date  . Glaucoma     Right eye  . Diverticulosis     multiple hospital admissions for GI bleed, has an episode roughly every 6 months,, last  colonscopy in 2006 showed diverticulosis, was evaluated by Dr. Madilyn Fireman in the hospital in 2011 for lower GI bleed andd it  was suggested that the patient was not actively bleeding at that time and given his comorbid conditions colonoscopy was deferred. He is also chronically constipated  . Macular degeneration of right eye   . Allergic rhinitis   . CAD (coronary artery disease)     s/p PCI in 1991;  Cardiac catheterization 11/02/11: Ostial LAD 95% with aneurysmal dilatation after this, proximal circumflex 70% with a superior branch 30-40%, proximal RCA 75% and 90%, mid RCA 50% and 90% before a PDA, then occluded.;    high risk for CABG;PCI 11/14/11: Bare-metal stent to the proximal LAD    . OSA on CPAP   . Hypertension   . Hyperlipidemia   . Chronic systolic heart failure     Echocardiogram 11/02/11: Moderate LVH, EF 30-35%, multiple wall motion abnormalities, critical aortic stenosis, AVA 0.6, mean gradient 45, mild MR, severe LAE  . Supraventricular tachycardia     2 syndromes-nonsustained atrial tachycardia//adenosine responsive  diuretic positive reentry probably AV node reentry  . Aortic stenosis     s/p AV balloon valvuloplasty by Dr. Excell Seltzer 11/2011  . Ischemic cardiomyopathy   . H/O: GI bleed     recurrent  . Heart murmur   . CHF (congestive heart failure)    Past Surgical History:  Past Surgical History  Procedure Date  . Implantation of baerveldt glaucoma device lant with scleral reinforcement using tutoplast tissue graft right eye.   . Coronary angioplasty with stent placement   . Corneal transplant     OT Assessment/Plan/Recommendation OT Assessment Clinical Impression Statement: Pt. presents with GIB and overall decreased activity tolerance with below problem list. Pt. anticipates D/C to a facility today for ST-rehab and will defer further OT to facility. OT Recommendation/Assessment: All further OT needs can be met in the next venue of care OT Problem List: Decreased strength;Decreased activity tolerance;Impaired balance (sitting and/or standing);Decreased safety awareness;Decreased knowledge of use of DME or AE;Decreased knowledge of precautions Barriers to Discharge: Other (comment) (Friend can provide assist half of the day) Barriers to Discharge Comments: Pt. will benefit from 24hr supervision due to decreased activity tolerance and balance OT Recommendation Follow Up Recommendations: Skilled nursing facility Equipment Recommended: Defer to next venue  OT Evaluation Precautions/Restrictions  Precautions Precautions: Fall Restrictions Weight Bearing Restrictions: No Prior Functioning Home Living Lives With: Alone Receives Help From: Friend(s) Type of Home: House Home Layout: Multi-level Alternate Level Stairs-Rails: Left Alternate Level Stairs-Number of Steps: 5  Home Access: Stairs to enter Entrance Stairs-Rails: Left Entrance Stairs-Number of Steps: 5 Bathroom Shower/Tub: Health visitor: Handicapped height Bathroom Accessibility: Yes How Accessible: Accessible via  walker Home Adaptive Equipment: Quad cane;Walker - rolling Additional Comments: pt currently staying at SNF and states he wants to return to his home from here and not return to SNF.  pt has friend that spends "half the day" with him and helps with errands and chores.   Prior Function Level of Independence: Independent with basic ADLs;Independent with transfers;Needs assistance with homemaking;Requires assistive device for independence Driving: No Vocation: Retired ADL ADL Eating/Feeding: Performed;Independent Where Assessed - Eating/Feeding: Chair Grooming: Performed;Wash/dry hands;Wash/dry face;Teeth care;Set up;Minimal assistance Grooming Details (indicate cue type and reason): MIn verbal cues for use of RW and safety with hand placement Where Assessed - Grooming: Standing at sink Upper Body Bathing: Simulated;Chest;Right arm;Left arm;Abdomen;Set up Where Assessed - Upper Body Bathing: Sitting, chair Lower Body Bathing: Simulated;Moderate assistance Lower Body Bathing Details (indicate cue type and reason): Pt. unable to reach lower aspect of legs Where Assessed - Lower Body Bathing: Sit to stand from chair Upper Body Dressing: Performed;Minimal assistance Upper Body Dressing Details (indicate cue type and reason): With donning gown Where Assessed - Upper Body Dressing: Sitting, chair Lower Body Dressing: Simulated;Moderate assistance Where Assessed - Lower Body Dressing: Sit to stand from chair Toilet Transfer: Performed;Minimal assistance Toilet Transfer Details (indicate cue type and reason): Mod verbal cues for hand placement and technique Toilet Transfer Method: Ambulating Toilet Transfer Equipment: Raised toilet seat with arms (or 3-in-1 over toilet) Toileting - Clothing Manipulation: Performed;Minimal assistance Toileting - Clothing Manipulation Details (indicate cue type and reason): with holding gown Where Assessed - Toileting Clothing Manipulation: Sit to stand from 3-in-1  or toilet Toileting - Hygiene: Simulated;Minimal assistance Where Assessed - Toileting Hygiene: Sit on 3-in-1 or toilet Tub/Shower Transfer: Not assessed Tub/Shower Transfer Method: Not assessed Equipment Used: Rolling walker Ambulation Related to ADLs: Pt. min assist ~45' with RW  ADL Comments: Pt. educated on techniques for completing LB ADLs and safety with ADLs due to decreased activity tolerance and balance.  Vision/Perception  Vision - History Baseline Vision: Wears glasses all the time Patient Visual Report: No change from baseline   Extremity Assessment RUE Assessment RUE Assessment: Within Functional Limits LUE Assessment LUE Assessment: Within Functional Limits Mobility  Bed Mobility Bed Mobility: No Transfers Transfers: Yes Sit to Stand: 5: Supervision;With upper extremity assist;With armrests;From chair/3-in-1 Stand to Sit: 5: Supervision;To chair/3-in-1 Exercises General Exercises - Upper Extremity Shoulder Flexion: AROM;10 reps;Right;Left;AAROM;Seated Shoulder ABduction: AROM;Right;Left;10 reps;Seated Shoulder ADduction: AROM;Right;Left;10 reps;Seated Elbow Flexion: AROM;Right;Left;10 reps;Seated End of Session OT - End of Session Equipment Utilized During Treatment: Gait belt Activity Tolerance: Patient tolerated treatment well Patient left: in chair;with call bell in reach Nurse Communication: Mobility status for transfers General Behavior During Session: [facility] for tasks performed Cognition: [facility] for tasks performed   Tailey Top, OTR/L Pager [phone] 12/17/2011, 9:59 AM

## 2011-12-17 NOTE — Progress Notes (Signed)
Subjective: When I walk in the patient is with PT in the chair. They are getting ready to walk. He states he is feeling well and had a bowel movement with no blood in it this morning. No pain anywhere.   Objective: Vital signs in last 24 hours: Filed Vitals:   12/16/11 1350 12/16/11 1730 12/16/11 2328 12/17/11 0626  BP: 111/52 115/51 118/49 142/61  Pulse: 60 54 50 58  Temp: 98.1 F (36.7 C) 97.4 F (36.3 C) 98.3 F (36.8 C) 97.6 F (36.4 C)  TempSrc: Oral Oral Oral Oral  Resp: 17 18 18 18   Height:      Weight:   199 lb (90.266 kg)   SpO2: 96% 97% 95% 95%   Weight change: 1 lb (0.454 kg)  Intake/Output Summary (Last 24 hours) at 12/17/11 0958 Last data filed at 12/17/11 0700  Gross per 24 hour  Intake    480 ml  Output    800 ml  Net   -320 ml   Physical Exam: General: resting in chair,pleasant, talkative HEENT: PERRL, EOMI, no scleral icterus Cardiac: RRR, no rubs, murmurs or gallops Pulm: clear to auscultation bilaterally, moving normal volumes of air Abd: soft, nontender, nondistended, BS present, obese Ext: warm and well perfused, no pedal edema Neuro: alert and oriented X3, cranial nerves II-XII grossly intact  Lab Results: Basic Metabolic Panel:  Lab 12/13/11 4098 12/12/11 0825  NA 138 139  K 4.2 4.5  CL 107 106  CO2 22 24  GLUCOSE 103* 92  BUN 22 31*  CREATININE 0.89 1.07  CALCIUM 8.3* 9.1  MG -- --  PHOS -- --   Liver Function Tests:  Lab 12/12/11 0825  AST 9  ALT 7  ALKPHOS 107  BILITOT 0.2*  PROT 6.1  ALBUMIN 3.1*   CBC:  Lab 12/17/11 0700 12/15/11 0825 12/12/11 0825  WBC 5.4 6.9 --  NEUTROABS -- -- 4.1  HGB 8.3* 8.9* --  HCT 26.6* 28.0* --  MCV 92.0 91.8 --  PLT 312 300 --   Coagulation:  Lab 12/12/11 0825  LABPROT 13.9  INR 1.05   Urinalysis:  Lab 12/12/11 1003  COLORURINE YELLOW  LABSPEC 1.019  PHURINE 6.0  GLUCOSEU NEGATIVE  HGBUR NEGATIVE  BILIRUBINUR NEGATIVE  KETONESUR NEGATIVE  PROTEINUR NEGATIVE  UROBILINOGEN  0.2  NITRITE NEGATIVE  LEUKOCYTESUR NEGATIVE   Studies/Results: No results found. Medications: I have reviewed the patient's current medications. Scheduled Meds:    . amiodarone  100 mg Oral Daily  . amLODipine  5 mg Oral Daily  . carvedilol  6.25 mg Oral BID  . docusate sodium  100 mg Oral BID  . ferrous sulfate  325 mg Oral Q breakfast  . furosemide  40 mg Oral Daily  . losartan  50 mg Oral Daily  . loteprednol  1 drop Right Eye q morning - 10a  . pantoprazole  20 mg Oral Q1200  . simvastatin  5 mg Oral q1800  . sodium chloride  3 mL Intravenous Q12H  . Travoprost (BAK Free)  1 drop Right Eye QHS  . DISCONTD: polyethylene glycol-electrolytes  4,000 mL Oral Once   Continuous Infusions:    . DISCONTD: sodium chloride 20 mL/hr at 12/12/11 1603   PRN Meds:.acetaminophen, bisacodyl, ondansetron (ZOFRAN) IV, ondansetron Assessment/Plan:   GI bleed - Stable CBCs. No colonoscopy this admission however he does have cardiac clearance if he needs one in the future. Stable.    HYPERLIPIDEMIA - Continue on statin. Stable.  Chronic systolic heart failure - Not in state of fluid overload. On carvedilol. Holding ASA and plavix. Will not discharge on plavix but plan to restart ASA at discharge.    SVT (supraventricular tachycardia) - On carvedilol.  HTN - On carvedilol, amlodipine, losartan.    CAD (coronary artery disease) - With recent history of stent but 1 month out can stop plavix and holding ASA.   Disposition - Will go to SNF today (back to [place]) or other if no bed available.      LOS: 5 days   Genella Mech 12/17/2011, 9:58 AM

## 2011-12-17 NOTE — Progress Notes (Signed)
Clinical Social Work-CSW facilitated pt d/c to [facility] with Pastor/POA providing transportation and d/c packet assembled-No further CSW needs-Crue Otero [place], [phone]

## 2011-12-17 NOTE — Progress Notes (Signed)
Pt discharged to rehab facility. Pt's friend is transporting him. Packet from social work given to friend. IV and telemetry removed. Assessment unchanged from morning.

## 2011-12-17 NOTE — Discharge Instructions (Signed)
You were seen for blood in your bowel movements. We do not think that you lost that much blood however continue watching for blood in your bowel movements. We do not think you need a colonoscopy at this time. Continue doing physical therapy and building up your strength.   Do NOT take PLAVIX.   It is OKAY for you to take ASPIRIN daily.

## 2012-01-01 ENCOUNTER — Encounter: Payer: Self-pay | Admitting: Cardiology

## 2012-01-01 ENCOUNTER — Ambulatory Visit (INDEPENDENT_AMBULATORY_CARE_PROVIDER_SITE_OTHER): Payer: Medicare Other | Admitting: Cardiology

## 2012-01-01 VITALS — BP 90/50 | HR 54 | Ht 67.0 in | Wt 204.0 lb

## 2012-01-01 DIAGNOSIS — I350 Nonrheumatic aortic (valve) stenosis: Secondary | ICD-10-CM

## 2012-01-01 DIAGNOSIS — I2510 Atherosclerotic heart disease of native coronary artery without angina pectoris: Secondary | ICD-10-CM

## 2012-01-01 DIAGNOSIS — I471 Supraventricular tachycardia: Secondary | ICD-10-CM

## 2012-01-01 DIAGNOSIS — I359 Nonrheumatic aortic valve disorder, unspecified: Secondary | ICD-10-CM

## 2012-01-01 DIAGNOSIS — I10 Essential (primary) hypertension: Secondary | ICD-10-CM

## 2012-01-01 DIAGNOSIS — I5022 Chronic systolic (congestive) heart failure: Secondary | ICD-10-CM

## 2012-01-01 DIAGNOSIS — I498 Other specified cardiac arrhythmias: Secondary | ICD-10-CM

## 2012-01-01 MED ORDER — AMLODIPINE BESYLATE 2.5 MG PO TABS: 2.5000 mg | ORAL_TABLET | Freq: Every day | ORAL | Status: DC

## 2012-01-01 NOTE — Assessment & Plan Note (Signed)
He seems to be doing well s/p valvuloplasty.  I reviewed the anatomy in detail with the patient his HCPOA and daughter.  We will continue treat him medically.

## 2012-01-01 NOTE — Assessment & Plan Note (Signed)
He seems to be euvolemic.  At this point, no change in therapy is indicated.  We have reviewed salt and fluid restrictions.  No further cardiovascular testing is indicated.   

## 2012-01-01 NOTE — Patient Instructions (Signed)
Please decrease your Amlodipine to 2.5 mg a day Continue all other medications as listed  Follow up with Dr Antoine Poche in 2 months.

## 2012-01-01 NOTE — Assessment & Plan Note (Signed)
His blood pressure was actually running low and I will reduce his amlodipine to 2.5 mg daily.

## 2012-01-01 NOTE — Assessment & Plan Note (Signed)
The patient has no new sypmtoms.  No further cardiovascular testing is indicated.  We will continue with aggressive risk reduction and meds as listed.  

## 2012-01-01 NOTE — Assessment & Plan Note (Signed)
I have reviewed the hospital records. The patient had a complicated course with multiple atrial dysrhythmias ultimately requiring treatment with amiodarone. I think it is most prudent to continue this. Of note there was discussion of reducing this to 200 mg daily because of bradycardia. However, he's not having any symptoms with this he was symptomatic with a dysrhythmia. Therefore, he will continue with the current dose.

## 2012-01-01 NOTE — Progress Notes (Signed)
HPI The patient presents for followup of his known coronary disease status post stenting, aortic stenosis status post valvuloplasty and cardiomyopathy. Since his complicated hospitalization earlier this year and his procedures he's done well. He is at rehabilitation at a nursing home. He says he does exercising walking and physical therapy. With this he says his breathing is much better. He denies any shortness of breath, PND or orthopnea. He doesn't report any palpitations, presyncope or syncope. He's had no chest pain. He thinks he is getting stronger. Of note he was hospitalized with GI bleeding. This was felt to be related to diverticulosis. He did not have colonoscopy though I understand they're still some consideration the wether he will have this.  He has not noticed any active bleeding.  Allergies  Allergen Reactions  . Penicillins     REACTION: "turns red"    Current Outpatient Prescriptions  Medication Sig Dispense Refill  . amiodarone (PACERONE) 200 MG tablet Take 200 mg by mouth daily.      Marland Kitchen amLODipine (NORVASC) 5 MG tablet Take 5 mg by mouth daily.      Marland Kitchen aspirin 81 MG tablet Take 81 mg by mouth daily.        . carvedilol (COREG) 6.25 MG tablet Take 6.25 mg by mouth 2 (two) times daily.      Marland Kitchen docusate sodium (COLACE) 100 MG capsule Take 100 mg by mouth 2 (two) times daily.       . ferrous sulfate 325 (65 FE) MG tablet Take 325 mg by mouth daily with breakfast.      . furosemide (LASIX) 40 MG tablet Take 40 mg by mouth daily.      . lansoprazole (PREVACID) 30 MG capsule Take 30 mg by mouth daily at 12 noon.      Marland Kitchen losartan (COZAAR) 50 MG tablet Take 50 mg by mouth daily.      Marland Kitchen loteprednol (LOTEMAX) 0.5 % ophthalmic suspension Place 1 drop into the right eye every morning.       . magnesium hydroxide (MILK OF MAGNESIA) 400 MG/5ML suspension Take 30 mLs by mouth daily as needed. For constipation      . pravastatin (PRAVACHOL) 40 MG tablet Take 40 mg by mouth every evening.        Marland Kitchen spironolactone (ALDACTONE) 25 MG tablet Take 12.5 mg by mouth every morning.      . travoprost, benzalkonium, (TRAVATAN) 0.004 % ophthalmic solution Place 1 drop into the right eye at bedtime.          Past Medical History  Diagnosis Date  . Glaucoma     Right eye  . Diverticulosis     multiple hospital admissions for GI bleed, has an episode roughly every 6 months,, last  colonscopy in 2006 showed diverticulosis, was evaluated by Dr. Madilyn Fireman in the hospital in 2011 for lower GI bleed andd it  was suggested that the patient was not actively bleeding at that time and given his comorbid conditions colonoscopy was deferred. He is also chronically constipated  . Macular degeneration of right eye   . Allergic rhinitis   . CAD (coronary artery disease)     s/p PCI in 1991;  Cardiac catheterization 11/02/11: Ostial LAD 95% with aneurysmal dilatation after this, proximal circumflex 70% with a superior branch 30-40%, proximal RCA 75% and 90%, mid RCA 50% and 90% before a PDA, then occluded.;    high risk for CABG;PCI 11/14/11: Bare-metal stent to the proximal LAD    .  OSA on CPAP   . Hypertension   . Hyperlipidemia   . Chronic systolic heart failure     Echocardiogram 11/02/11: Moderate LVH, EF 30-35%, multiple wall motion abnormalities, critical aortic stenosis, AVA 0.6, mean gradient 45, mild MR, severe LAE  . Supraventricular tachycardia     2 syndromes-nonsustained atrial tachycardia//adenosine responsive diuretic positive reentry probably AV node reentry  . Aortic stenosis     s/p AV balloon valvuloplasty by Dr. Excell Seltzer 11/2011  . Ischemic cardiomyopathy   . H/O: GI bleed     recurrent  . Heart murmur   . CHF (congestive heart failure)     Past Surgical History  Procedure Date  . Implantation of baerveldt glaucoma device lant with scleral reinforcement using tutoplast tissue graft right eye.   . Coronary angioplasty with stent placement   . Corneal transplant     ROS:  As stated in the  HPI and negative for all other systems.  PHYSICAL EXAM BP 90/50  Pulse 54  Ht 5\' 7"  (1.702 m)  Wt 204 lb (92.534 kg)  BMI 31.95 kg/m2 GENERAL:  Well appearing HEENT:  Pupils equal round and reactive, fundi not visualized, oral mucosa unremarkable NECK:  No jugular venous distention, waveform within normal limits, carotid upstroke brisk and symmetric, no bruits, no thyromegaly LYMPHATICS:  No cervical, inguinal adenopathy LUNGS:  Clear to auscultation bilaterally BACK:  No CVA tenderness CHEST:  Unremarkable HEART:  PMI not displaced or sustained,S1 and S2 within normal limits, no S3, no S4, no clicks, no rubs, no murmurs ABD:  Flat, positive bowel sounds normal in frequency in pitch, no bruits, no rebound, no guarding, no midline pulsatile mass, no hepatomegaly, no splenomegaly EXT:  2 plus pulses throughout, no edema, no cyanosis no clubbing SKIN:  No rashes no nodules NEURO:  Cranial nerves II through XII grossly intact, motor grossly intact throughout PSYCH:  Cognitively intact, oriented to person place and time   ASSESSMENT AND PLAN

## 2012-01-05 ENCOUNTER — Other Ambulatory Visit (HOSPITAL_COMMUNITY): Payer: Self-pay | Admitting: Internal Medicine

## 2012-01-18 ENCOUNTER — Telehealth: Payer: Self-pay | Admitting: Cardiology

## 2012-01-18 NOTE — Telephone Encounter (Signed)
Pt's caregiver calling re difference in dosage on cordarone 200mg , aldoctone 25mg , norvasc 25mg  coreg 6.25mg  lasix 40mg , then [facility] [address] , [employer] [phone]

## 2012-01-21 NOTE — Telephone Encounter (Signed)
Spoke with pt poa, called and spoke to the pharm regarding meds that were listed. Correct directions and refills given

## 2012-01-21 NOTE — Telephone Encounter (Signed)
They have been waiting for an answer since last week and need a call back asap

## 2012-01-24 ENCOUNTER — Telehealth: Payer: Self-pay | Admitting: Cardiology

## 2012-01-24 ENCOUNTER — Telehealth: Payer: Self-pay | Admitting: *Deleted

## 2012-01-24 DIAGNOSIS — I350 Nonrheumatic aortic (valve) stenosis: Secondary | ICD-10-CM

## 2012-01-24 MED ORDER — PRAVASTATIN SODIUM 40 MG PO TABS: 40.0000 mg | ORAL_TABLET | Freq: Every evening | ORAL | Status: DC

## 2012-01-24 NOTE — Telephone Encounter (Signed)
Please return call to patient friend Allison Quarry [phone]  Patient needs prescription for Levothyroxine 25 MG refilled, not listed under meds. This medication could possibly be prescribed by another provider, Mr. Julien Girt is trying to help patient out.   Please return call to Allison Quarry [phone] to advise.  [facility]ified [facility] [address].

## 2012-01-24 NOTE — Telephone Encounter (Signed)
POA calling again to clarify RX.  All Rx's reviewed with POA.  Pt does not have a Rx for pravastatin 40 mg as he should.  He does have a Rx for simvastatin which he was instructed not to give to pt.  Levothyroxine is to come from his PCP.  POA states understanding.

## 2012-01-24 NOTE — Telephone Encounter (Signed)
Eugene [name] from [facility] needs a verbal order to start OT, PT,and Home health aid.  States pt had been going to court after being discharge that they  Were they were unable to see pt within their 5 day window.  So, now they need a new order.  Requests verbal order now then the company will fax an order to be sign later. Thanks

## 2012-01-25 NOTE — Telephone Encounter (Signed)
Verbal given to Kristopher Glee, for PT/OT per Dr Baltazar Apo.

## 2012-01-28 ENCOUNTER — Ambulatory Visit (INDEPENDENT_AMBULATORY_CARE_PROVIDER_SITE_OTHER): Payer: Medicare Other | Admitting: Internal Medicine

## 2012-01-28 ENCOUNTER — Encounter: Payer: Self-pay | Admitting: Internal Medicine

## 2012-01-28 VITALS — BP 127/61 | HR 47 | Temp 97.1°F

## 2012-01-28 DIAGNOSIS — E039 Hypothyroidism, unspecified: Secondary | ICD-10-CM

## 2012-01-28 LAB — TSH: TSH: 2.379 u[IU]/mL (ref 0.350–4.500)

## 2012-01-28 MED ORDER — LEVOTHYROXINE SODIUM 25 MCG PO TABS: 25.0000 ug | ORAL_TABLET | Freq: Every day | ORAL | Status: DC

## 2012-01-28 NOTE — Patient Instructions (Signed)
Please, take all your medications as prescribed and call with any questions. Please, follow up in 3 months or sooner if needed.

## 2012-01-28 NOTE — Progress Notes (Signed)
Patient ID: Eugene [name], male   DOB: [birth year], 78 y.o.   MRN: [id number] HPI:    Eugene [name] presents for a f/u appointment with his son and POA.  1. CAD, sp valvuloplasty in 11/2011. Denies any SOB, CP, tightness, increase leg swelling or weight gain. 2.  RF on synthroid. 3. Requests to clip his toenails.  Review of Systems: Negative except per history of present illness  Physical Exam:  Nursing notes and vitals reviewed General:  alert, well-developed, and cooperative to examination.   Lungs:  normal respiratory effort, no accessory muscle use, normal breath sounds, no crackles, and no wheezes. Heart:  normal rate, regular rhythm, no murmurs, no gallop, and no rub.   Abdomen:  soft, non-tender, normal bowel sounds, no distention, no guarding, no rebound tenderness, no hepatomegaly, and no splenomegaly.   Extremities:  No cyanosis, clubbing, edema Neurologic:  alert & oriented X3, nonfocal exam  Meds:  (Not in a [name] admission)  Allergies: Penicillins Past Medical History  Diagnosis Date  . Glaucoma     Right eye  . Diverticulosis     multiple [name] admissions for GI bleed, has an episode roughly every 6 months,, last  colonscopy in 2006 showed diverticulosis, was evaluated by Eugene [name] in the [name] in 2011 for lower GI bleed andd it  was suggested that the patient was not actively bleeding at that time and given his comorbid conditions colonoscopy was deferred. He is also chronically constipated  . Macular degeneration of right eye   . Allergic rhinitis   . CAD (coronary artery disease)     s/p PCI in 1991;  Cardiac catheterization 11/02/11: Ostial LAD 95% with aneurysmal dilatation after this, proximal circumflex 70% with a superior branch 30-40%, proximal RCA 75% and 90%, mid RCA 50% and 90% before a PDA, then occluded.;    high risk for CABG;PCI 11/14/11: Bare-metal stent to the proximal LAD    . OSA on CPAP   . Hypertension   . Hyperlipidemia   . Chronic systolic  heart failure     Echocardiogram 11/02/11: Moderate LVH, EF 30-35%, multiple wall motion abnormalities, critical aortic stenosis, AVA 0.6, mean gradient 45, mild MR, severe LAE  . Supraventricular tachycardia     2 syndromes-nonsustained atrial tachycardia//adenosine responsive diuretic positive reentry probably AV node reentry  . Aortic stenosis     s/p AV balloon valvuloplasty by Eugene [name] 11/2011  . Ischemic cardiomyopathy   . H/O: GI bleed     recurrent  . Heart murmur   . CHF (congestive heart failure)    Past Surgical History  Procedure Date  . Implantation of baerveldt glaucoma device lant with scleral reinforcement using tutoplast tissue graft right eye.   . Coronary angioplasty with stent placement   . Corneal transplant    No family history on file. History   Social History  . Marital Status: Widowed    Spouse Name: N/A    Number of Children: N/A  . Years of Education: N/A   Occupational History  . Not on file.   Social History Main Topics  . Smoking status: Former Smoker    Types: Cigarettes    Quit date: 10/08/1960  . Smokeless tobacco: Never Used  . Alcohol Use: No  . Drug Use: No  . Sexually Active: No   Other Topics Concern  . Not on file   Social History Narrative   Lives at the [facility] nursing home. He has a daughter in [place] and a  health-care power of attorney. He also has a close friend who takes him for his doctor's appointments and is with him during the [name] admission. 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 Never smoked or drank alcohol in past 40 years. No illicit drug use. Has Medicare.   A/P: 1. CAD with EF of 40% per last 2-D ECHO in 11/2011 -sp valvuloplasty -The patient reports no symptoms and hemodynamically stable.  -being followed by Eugene [name] cardiology)  2. Hypothyroidism -check TSH today -Rf on synthroid  3. Onycho mycoses to all toes - toe nails clipped  4.  Diverticuloses -no new rectal bleed or abdominal Sx -continue with a high-fiber diet  5. Referral to SW -arrange meals on the wheels  F/U on PRN basses.

## 2012-01-29 ENCOUNTER — Telehealth: Payer: Self-pay | Admitting: Licensed Clinical Social Worker

## 2012-01-29 NOTE — Telephone Encounter (Signed)
Pt referred to CSW for referral to [employer].  CSW spoke with pt to confirm referral and to notify pt social worker from [employer] will be contacting him for [place] assessment.  CSW called [employer], referral completed.  According to [employer] scheduling is 3 months out.  CSW called Mr. Ane Payment and notified Mr. Ane Payment of the completed referral and scheduling timing.

## 2012-02-19 ENCOUNTER — Telehealth: Payer: Self-pay | Admitting: *Deleted

## 2012-02-19 NOTE — Telephone Encounter (Signed)
Call from Physical Therapist with Home Health.  # [id number] Pt told PT that his BP before arrival was 167/88, PT walked pt and did exercises and pt " just happened to slide off chair " without injury and BP was 166/92.  Pt was feeling fine.  HR 76 and no distress.  This is FYI  No date of birth given and PT did not give his name.??

## 2012-02-27 ENCOUNTER — Other Ambulatory Visit: Payer: Self-pay | Admitting: *Deleted

## 2012-02-27 MED ORDER — PANTOPRAZOLE SODIUM 40 MG PO TBEC: 40.0000 mg | DELAYED_RELEASE_TABLET | Freq: Every day | ORAL | Status: DC

## 2012-02-27 NOTE — Telephone Encounter (Signed)
Pt has been out for 3 days. Pt # [id number]

## 2012-03-04 ENCOUNTER — Ambulatory Visit (INDEPENDENT_AMBULATORY_CARE_PROVIDER_SITE_OTHER): Payer: Medicare Other | Admitting: Cardiology

## 2012-03-04 ENCOUNTER — Encounter: Payer: Self-pay | Admitting: Cardiology

## 2012-03-04 VITALS — BP 150/80 | HR 60 | Ht 67.0 in | Wt 205.0 lb

## 2012-03-04 DIAGNOSIS — R0602 Shortness of breath: Secondary | ICD-10-CM

## 2012-03-04 NOTE — Patient Instructions (Signed)
The current medical regimen is effective;  continue present plan and medications.  Please have blood work drawn at your next appointment with your primary care [facility] and Hepatic panel)  Your physician has requested that you have an echocardiogram in August. Echocardiography is a painless test that uses sound waves to create images of your heart. It provides your doctor with information about the size and shape of your heart and how well your heart's chambers and valves are working. This procedure takes approximately one hour. There are no restrictions for this procedure.  Follow up with Dr Antoine Poche same day as your echocardiogram.

## 2012-03-04 NOTE — Assessment & Plan Note (Signed)
The patient has no new sypmtoms.  No further cardiovascular testing is indicated.  We will continue with aggressive risk reduction and meds as listed.  

## 2012-03-04 NOTE — Assessment & Plan Note (Signed)
He seems to be doing well symptomatically. I will check an echocardiogram in August. I will see him back at that time.

## 2012-03-04 NOTE — Progress Notes (Signed)
HPI The patient presents for followup of his known coronary disease status post stenting, aortic stenosis status post valvuloplasty and cardiomyopathy. Since his complicated hospitalization earlier this year and his procedures he's done well. He is now at home and doing rehabilitation. He is walking with a walker. He has none of the shortness of breath he had previously. He's having no palpitations. He did have one episode of presyncope while he was seated in a court room a few weeks ago. However, he has not lost consciousness. He says he actually feels quite well. He denies any chest pressure, neck or arm discomfort. He has had no weight gain or edema.  Allergies  Allergen Reactions  . Penicillins     REACTION: "turns red"    Current Outpatient Prescriptions  Medication Sig Dispense Refill  . amiodarone (PACERONE) 200 MG tablet Take 200 mg by mouth daily.      Marland Kitchen amLODipine (NORVASC) 2.5 MG tablet Take 1 tablet (2.5 mg total) by mouth daily.  30 tablet  11  . aspirin 81 MG tablet Take 81 mg by mouth daily.        . Calcium Carbonate-Vitamin D (CALCIUM + D PO) Take by mouth daily.      . carvedilol (COREG) 6.25 MG tablet Take 6.25 mg by mouth 2 (two) times daily.      Marland Kitchen docusate sodium (COLACE) 100 MG capsule Take 100 mg by mouth 2 (two) times daily.       . ferrous sulfate 325 (65 FE) MG tablet Take 325 mg by mouth daily with breakfast.      . furosemide (LASIX) 40 MG tablet Take 40 mg by mouth daily.      Marland Kitchen levothyroxine (LEVOTHROID) 25 MCG tablet Take 1 tablet (25 mcg total) by mouth daily.  30 tablet  11  . losartan (COZAAR) 50 MG tablet Take 50 mg by mouth daily.      Marland Kitchen loteprednol (LOTEMAX) 0.5 % ophthalmic suspension Place 1 drop into the right eye every morning.       . magnesium hydroxide (MILK OF MAGNESIA) 400 MG/5ML suspension Take 30 mLs by mouth daily as needed. For constipation      . pantoprazole (PROTONIX) 40 MG tablet Take 1 tablet (40 mg total) by mouth daily.  30 tablet   3  . pravastatin (PRAVACHOL) 40 MG tablet Take 1 tablet (40 mg total) by mouth every evening.  30 tablet  11  . spironolactone (ALDACTONE) 25 MG tablet Take 12.5 mg by mouth every morning.      . travoprost, benzalkonium, (TRAVATAN) 0.004 % ophthalmic solution Place 1 drop into the right eye at bedtime.          Past Medical History  Diagnosis Date  . Glaucoma     Right eye  . Diverticulosis     multiple hospital admissions for GI bleed, has an episode roughly every 6 months,, last  colonscopy in 2006 showed diverticulosis, was evaluated by Dr. Madilyn Fireman in the hospital in 2011 for lower GI bleed andd it  was suggested that the patient was not actively bleeding at that time and given his comorbid conditions colonoscopy was deferred. He is also chronically constipated  . Macular degeneration of right eye   . Allergic rhinitis   . CAD (coronary artery disease)     s/p PCI in 1991;  Cardiac catheterization 11/02/11: Ostial LAD 95% with aneurysmal dilatation after this, proximal circumflex 70% with a superior branch 30-40%, proximal RCA 75% and  90%, mid RCA 50% and 90% before a PDA, then occluded.;    high risk for CABG;PCI 11/14/11: Bare-metal stent to the proximal LAD    . OSA on CPAP   . Hypertension   . Hyperlipidemia   . Chronic systolic heart failure     Echocardiogram 11/02/11: Moderate LVH, EF 30-35%, multiple wall motion abnormalities, critical aortic stenosis, AVA 0.6, mean gradient 45, mild MR, severe LAE  . Supraventricular tachycardia     2 syndromes-nonsustained atrial tachycardia//adenosine responsive diuretic positive reentry probably AV node reentry  . Aortic stenosis     s/p AV balloon valvuloplasty by Dr. Excell Seltzer 11/2011  . Ischemic cardiomyopathy   . H/O: GI bleed     recurrent  . Heart murmur   . CHF (congestive heart failure)     Past Surgical History  Procedure Date  . Implantation of baerveldt glaucoma device lant with scleral reinforcement using tutoplast tissue graft  right eye.   . Coronary angioplasty with stent placement   . Corneal transplant     ROS:  As stated in the HPI and negative for all other systems.  PHYSICAL EXAM BP 150/80  Pulse 60  Ht 5\' 7"  (1.702 m)  Wt 205 lb (92.987 kg)  BMI 32.11 kg/m2 PHYSICAL EXAM GEN:  No distress NECK:  No jugular venous distention at 90 degrees, waveform within normal limits, carotid upstroke brisk and symmetric, no bruits, no thyromegaly LYMPHATICS:  No cervical adenopathy LUNGS:  Clear to auscultation bilaterally BACK:  No CVA tenderness CHEST:  Unremarkable HEART:  S1 and S2 within normal limits, no S3, no S4, no clicks, no rubs, apical systolic radiating out the aortic outflow tract and mid peaking murmur, no diastolic murmur ABD:  Positive bowel sounds normal in frequency in pitch, no bruits, no rebound, no guarding, unable to assess midline mass or bruit with the patient seated. EXT:  2 plus pulses throughout, moderate edema, no cyanosis no clubbing SKIN:  No rashes no nodules NEURO:  Cranial nerves II through XII grossly intact, motor grossly intact throughout PSYCH:  Cognitively intact, oriented to person place and time  ASSESSMENT AND PLAN

## 2012-03-04 NOTE — Assessment & Plan Note (Signed)
He has had no symptomatic arrhythmias. He will continue with medications as listed. He's going to see his primary provider next month and I last for a CMET and liver enzymes. He had a recent normal TSH.

## 2012-03-12 ENCOUNTER — Other Ambulatory Visit: Payer: Self-pay | Admitting: Internal Medicine

## 2012-03-18 ENCOUNTER — Ambulatory Visit (INDEPENDENT_AMBULATORY_CARE_PROVIDER_SITE_OTHER): Payer: Medicare Other | Admitting: Internal Medicine

## 2012-03-18 ENCOUNTER — Encounter: Payer: Self-pay | Admitting: Internal Medicine

## 2012-03-18 VITALS — BP 145/73 | HR 56 | Temp 97.4°F | Ht 67.0 in | Wt 207.9 lb

## 2012-03-18 DIAGNOSIS — R001 Bradycardia, unspecified: Secondary | ICD-10-CM

## 2012-03-18 DIAGNOSIS — B351 Tinea unguium: Secondary | ICD-10-CM

## 2012-03-18 DIAGNOSIS — I10 Essential (primary) hypertension: Secondary | ICD-10-CM

## 2012-03-18 DIAGNOSIS — K5730 Diverticulosis of large intestine without perforation or abscess without bleeding: Secondary | ICD-10-CM

## 2012-03-18 DIAGNOSIS — I498 Other specified cardiac arrhythmias: Secondary | ICD-10-CM

## 2012-03-18 DIAGNOSIS — H409 Unspecified glaucoma: Secondary | ICD-10-CM

## 2012-03-18 DIAGNOSIS — I350 Nonrheumatic aortic (valve) stenosis: Secondary | ICD-10-CM

## 2012-03-18 DIAGNOSIS — I359 Nonrheumatic aortic valve disorder, unspecified: Secondary | ICD-10-CM

## 2012-03-18 LAB — CBC WITH DIFFERENTIAL/PLATELET
Basophils Absolute: 0.1 10*3/uL (ref 0.0–0.1)
Eosinophils Relative: 2 % (ref 0–5)
Lymphocytes Relative: 25 % (ref 12–46)
Lymphs Abs: 1.8 10*3/uL (ref 0.7–4.0)
MCV: 86.5 fL (ref 78.0–100.0)
Neutro Abs: 4.3 10*3/uL (ref 1.7–7.7)
Neutrophils Relative %: 62 % (ref 43–77)
Platelets: 241 10*3/uL (ref 150–400)
RBC: 5.27 MIL/uL (ref 4.22–5.81)
RDW: 16.9 % — ABNORMAL HIGH (ref 11.5–15.5)
WBC: 7 10*3/uL (ref 4.0–10.5)

## 2012-03-18 MED ORDER — LOSARTAN POTASSIUM 50 MG PO TABS: 50.0000 mg | ORAL_TABLET | Freq: Every day | ORAL | Status: DC

## 2012-03-18 NOTE — Assessment & Plan Note (Signed)
Referral made to podiatry. No oral/topical anti-fungal agents prescribed today.

## 2012-03-18 NOTE — Assessment & Plan Note (Signed)
Patient brings record of BP and HR and yesterday HR dropped to 48, asymptomatic. Will not make any dose adjustments as HR wnl today, but advised patient to contact clinic or cardiologist if HR drops into 40s and he is feeling lethargic/dizzy.

## 2012-03-18 NOTE — Assessment & Plan Note (Signed)
Asymptomatic currently, scheduled for Echo in August.

## 2012-03-18 NOTE — Assessment & Plan Note (Signed)
Stable without bleeding. Continue good fiber regimen, and stool softner.

## 2012-03-18 NOTE — Assessment & Plan Note (Signed)
Lab Results  Component Value Date   NA 138 12/13/2011   K 4.2 12/13/2011   CL 107 12/13/2011   CO2 22 12/13/2011   BUN 22 12/13/2011   CREATININE 0.89 12/13/2011   CREATININE 0.90 07/24/2011    BP Readings from Last 3 Encounters:  03/18/12 145/73  03/04/12 150/80  01/28/12 127/61    Assessment: Hypertension control:  controlled  Progress toward goals:  at goal Barriers to meeting goals:  no barriers identified  Plan: Hypertension treatment:  continue current medications Of note, amlodipine was reduced to 2.5 mg by Dr. Antoine Poche, BP stable since this change

## 2012-03-18 NOTE — Progress Notes (Signed)
Subjective:     Patient ID: Eugene [name], male   DOB: Nov 12, 1923, 78 y.o.   MRN: [id number]  HPI Mr. Dada is a 78 year old man with pmh significant for AS with recent repair, glaucoma, HLD, CAD, and diverticulosis/AVMs with numerous admissions in the past for GI bleeds who presents for a general monthly check-up. Patient has been doing well, has completed physical therapy, and is living back at home. He is present with his friend who also reports that he is doing well, and has progressed from wheelchair to walker. The court case that involved his daughter has reached a resolution and thus patient is also in better spirits because of this. He was seen by Dr. Antoine Poche of cardiology on 5/28 and is scheduled for echo this August.   Patient has no other complaints or concerns today. He denies chest pain, cough, sob, headache, N/V, changes in abdominal and urinary character.   Review of Systems  Respiratory: Negative for cough, chest tightness and shortness of breath.   Cardiovascular: Negative for chest pain.  Gastrointestinal: Negative for constipation, blood in stool, anal bleeding and rectal pain.  Neurological: Negative for dizziness, syncope, weakness and light-headedness.       Objective:   Physical Exam  Constitutional: He is oriented to person, place, and time. He appears well-developed.  HENT:  Head: Normocephalic and atraumatic.       Many crowns/fillings noted  Eyes: EOM are normal. Pupils are equal, round, and reactive to light.  Neck: Normal range of motion. Neck supple. No JVD present.  Cardiovascular: Normal heart sounds.        Systolic murmur, bradycardic  Pulmonary/Chest: Effort normal and breath sounds normal. He has no rales.  Abdominal: Soft. Bowel sounds are normal. He exhibits no distension.  Musculoskeletal: Normal range of motion.       Bilateral lower extremity edema noted extending to mid-calf  Neurological: He is alert and oriented to person, place, and time.    Skin:       Chronic venous stasis dermatitis noted, no erythema, warmth noted.   Psychiatric: He has a normal mood and affect. Thought content normal.

## 2012-03-18 NOTE — Patient Instructions (Addendum)
Please follow up with Dr. Antoine Poche as previously scheduled. Please follow up with your new primary care provider in 2-3 months. Please do not take Coreg (Carvedilol) if your heart rate drops into the 40s and you feel dizzy, lethargic or low energy.  Please follow up with Podiatrist for foot care.

## 2012-03-19 ENCOUNTER — Other Ambulatory Visit: Payer: Self-pay | Admitting: Internal Medicine

## 2012-03-19 DIAGNOSIS — N289 Disorder of kidney and ureter, unspecified: Secondary | ICD-10-CM

## 2012-03-19 LAB — COMPREHENSIVE METABOLIC PANEL
ALT: 9 U/L (ref 0–53)
AST: 14 U/L (ref 0–37)
Alkaline Phosphatase: 79 U/L (ref 39–117)
BUN: 30 mg/dL — ABNORMAL HIGH (ref 6–23)
Calcium: 10 mg/dL (ref 8.4–10.5)
Chloride: 103 mEq/L (ref 96–112)
Creat: 1.39 mg/dL — ABNORMAL HIGH (ref 0.50–1.35)
Total Bilirubin: 0.4 mg/dL (ref 0.3–1.2)

## 2012-03-19 NOTE — Progress Notes (Signed)
Talked with pt about results of lab. Appt made in 1-2 weeks to repeat lab per Dr Baltazar Apo.

## 2012-03-25 ENCOUNTER — Telehealth: Payer: Self-pay | Admitting: *Deleted

## 2012-03-25 NOTE — Telephone Encounter (Signed)
Eugene [name] has a history of systolic and diastolic left ventricular failure, previous inferior wall MI, and supraventricular tachycardia who is on carvedilol for his heart failure and CAD and amiodarone for his supraventricular tachycardia.  Eugene [name] apparently is currently asymptomatic with his heart rates in the 50's which is exactly where we would like his pulse to be.  If Eugene [name] becomes symptomatic secondary to bradycardia we may want to reassess the dose of the carvedilol but we definitely do not want to make any changes if Eugene [name] is currently asymptomatic on this regimen.

## 2012-03-25 NOTE — Telephone Encounter (Signed)
Call from [facility] with [facility]. # [phone] ex [phone] She reports pt's heart rate has been 60 - 70's in past but  Since Friday it has dropped to 40 - 50's. No change in meds. Weight stable. No SOB. C/o decrease energy.   RN wants [place] to be aware of this. Pt # [id number]  Last OV 6/11 Heart Rate 56 I called pt and he reports pulse is 58 now.  He feels better and thinks he felt tired when his rate was lower.  He is eating and feels good with no problems at present. Asked pt to call us if his rate goes below 55.

## 2012-04-02 ENCOUNTER — Other Ambulatory Visit (INDEPENDENT_AMBULATORY_CARE_PROVIDER_SITE_OTHER): Payer: Medicare Other

## 2012-04-02 ENCOUNTER — Other Ambulatory Visit: Payer: Medicare Other

## 2012-04-02 DIAGNOSIS — N289 Disorder of kidney and ureter, unspecified: Secondary | ICD-10-CM

## 2012-04-02 LAB — BASIC METABOLIC PANEL
CO2: 29 mEq/L (ref 19–32)
Calcium: 9.8 mg/dL (ref 8.4–10.5)
Chloride: 102 mEq/L (ref 96–112)
Creat: 1.17 mg/dL (ref 0.50–1.35)
Glucose, Bld: 98 mg/dL (ref 70–99)

## 2012-05-21 ENCOUNTER — Other Ambulatory Visit: Payer: Self-pay

## 2012-05-21 ENCOUNTER — Ambulatory Visit (HOSPITAL_COMMUNITY): Payer: Medicare Other | Attending: Cardiovascular Disease | Admitting: Radiology

## 2012-05-21 ENCOUNTER — Encounter: Payer: Self-pay | Admitting: Cardiology

## 2012-05-21 ENCOUNTER — Ambulatory Visit (INDEPENDENT_AMBULATORY_CARE_PROVIDER_SITE_OTHER): Payer: Medicare Other | Admitting: Cardiology

## 2012-05-21 VITALS — BP 154/92 | HR 60 | Ht 67.0 in | Wt 206.8 lb

## 2012-05-21 DIAGNOSIS — E785 Hyperlipidemia, unspecified: Secondary | ICD-10-CM | POA: Insufficient documentation

## 2012-05-21 DIAGNOSIS — I319 Disease of pericardium, unspecified: Secondary | ICD-10-CM | POA: Insufficient documentation

## 2012-05-21 DIAGNOSIS — I379 Nonrheumatic pulmonary valve disorder, unspecified: Secondary | ICD-10-CM | POA: Insufficient documentation

## 2012-05-21 DIAGNOSIS — T50905A Adverse effect of unspecified drugs, medicaments and biological substances, initial encounter: Secondary | ICD-10-CM

## 2012-05-21 DIAGNOSIS — I359 Nonrheumatic aortic valve disorder, unspecified: Secondary | ICD-10-CM

## 2012-05-21 DIAGNOSIS — I2589 Other forms of chronic ischemic heart disease: Secondary | ICD-10-CM

## 2012-05-21 DIAGNOSIS — R0602 Shortness of breath: Secondary | ICD-10-CM

## 2012-05-21 DIAGNOSIS — I2510 Atherosclerotic heart disease of native coronary artery without angina pectoris: Secondary | ICD-10-CM | POA: Insufficient documentation

## 2012-05-21 DIAGNOSIS — R001 Bradycardia, unspecified: Secondary | ICD-10-CM

## 2012-05-21 DIAGNOSIS — E669 Obesity, unspecified: Secondary | ICD-10-CM | POA: Insufficient documentation

## 2012-05-21 DIAGNOSIS — I509 Heart failure, unspecified: Secondary | ICD-10-CM | POA: Insufficient documentation

## 2012-05-21 DIAGNOSIS — I079 Rheumatic tricuspid valve disease, unspecified: Secondary | ICD-10-CM | POA: Insufficient documentation

## 2012-05-21 NOTE — Progress Notes (Signed)
HPI The patient presents for followup of his known coronary disease status post stenting, aortic stenosis status post valvuloplasty and cardiomyopathy. Since I last saw him he has well. He is living at home and gets around with walker. With this he denies any chest pressure, neck or arm discomfort. He's not having any shortness of breath, PND or orthopnea. He has had no palpitations, presyncope or syncope. He has had no weight gain or edema. He did have a fall reaching over to pick something up. He sleeps with CPAP and does well with this. He has got a blood pressure diary as described below. He did have an echo today and the pulmonary result is below.  Allergies  Allergen Reactions  . Penicillins     REACTION: "turns red"    Current Outpatient Prescriptions  Medication Sig Dispense Refill  . amiodarone (PACERONE) 200 MG tablet Take 200 mg by mouth daily.      Marland Kitchen amLODipine (NORVASC) 2.5 MG tablet Take 1 tablet (2.5 mg total) by mouth daily.  30 tablet  11  . aspirin 81 MG tablet Take 81 mg by mouth daily.        . Calcium Carbonate-Vitamin D (CALCIUM + D PO) Take by mouth daily.      . carvedilol (COREG) 6.25 MG tablet Take 6.25 mg by mouth 2 (two) times daily.      Marland Kitchen docusate sodium (COLACE) 100 MG capsule Take 100 mg by mouth 2 (two) times daily.       . ferrous sulfate 325 (65 FE) MG tablet Take 325 mg by mouth daily with breakfast.      . furosemide (LASIX) 40 MG tablet Take 40 mg by mouth daily.      Marland Kitchen levothyroxine (LEVOTHROID) 25 MCG tablet Take 1 tablet (25 mcg total) by mouth daily.  30 tablet  11  . losartan (COZAAR) 50 MG tablet Take 1 tablet (50 mg total) by mouth daily.  30 tablet  4  . loteprednol (LOTEMAX) 0.5 % ophthalmic suspension Place 1 drop into the right eye every morning.       . magnesium hydroxide (MILK OF MAGNESIA) 400 MG/5ML suspension Take 30 mLs by mouth daily as needed. For constipation      . pantoprazole (PROTONIX) 40 MG tablet Take 1 tablet (40 mg total) by  mouth daily.  30 tablet  3  . pravastatin (PRAVACHOL) 40 MG tablet Take 1 tablet (40 mg total) by mouth every evening.  30 tablet  11  . spironolactone (ALDACTONE) 25 MG tablet Take 12.5 mg by mouth every morning.      . travoprost, benzalkonium, (TRAVATAN) 0.004 % ophthalmic solution Place 1 drop into the right eye at bedtime.          Past Medical History  Diagnosis Date  . Glaucoma     Right eye  . Diverticulosis     multiple hospital admissions for GI bleed, has an episode roughly every 6 months,, last  colonscopy in 2006 showed diverticulosis, was evaluated by Dr. Madilyn Fireman in the hospital in 2011 for lower GI bleed andd it  was suggested that the patient was not actively bleeding at that time and given his comorbid conditions colonoscopy was deferred. He is also chronically constipated  . Macular degeneration of right eye   . Allergic rhinitis   . CAD (coronary artery disease)     s/p PCI in 1991;  Cardiac catheterization 11/02/11: Ostial LAD 95% with aneurysmal dilatation after this, proximal circumflex  70% with a superior branch 30-40%, proximal RCA 75% and 90%, mid RCA 50% and 90% before a PDA, then occluded.;    high risk for CABG;PCI 11/14/11: Bare-metal stent to the proximal LAD    . OSA on CPAP   . Hypertension   . Hyperlipidemia   . Chronic systolic heart failure     Echocardiogram 11/02/11: Moderate LVH, EF 30-35%, multiple wall motion abnormalities, critical aortic stenosis, AVA 0.6, mean gradient 45, mild MR, severe LAE  . Supraventricular tachycardia     2 syndromes-nonsustained atrial tachycardia//adenosine responsive diuretic positive reentry probably AV node reentry  . Aortic stenosis     s/p AV balloon valvuloplasty by Dr. Excell Seltzer 11/2011  . Ischemic cardiomyopathy   . H/O: GI bleed     recurrent  . Heart murmur   . CHF (congestive heart failure)     Past Surgical History  Procedure Date  . Implantation of baerveldt glaucoma device lant with scleral reinforcement using  tutoplast tissue graft right eye.   . Coronary angioplasty with stent placement   . Corneal transplant     ROS:  As stated in the HPI and negative for all other systems.  PHYSICAL EXAM BP 154/92  Pulse 60  Ht 5\' 7"  (1.702 m)  Wt 206 lb 12.8 oz (93.804 kg)  BMI 32.39 kg/m2 PHYSICAL EXAM GEN:  No distress NECK:  No jugular venous distention at 90 degrees, waveform within normal limits, carotid upstroke brisk and symmetric, no bruits, no thyromegaly LYMPHATICS:  No cervical adenopathy LUNGS:  Clear to auscultation bilaterally CHEST:  Unremarkable HEART:  S1 and S2 within normal limits, no S3, no S4, no clicks, no rubs, apical systolic radiating out the aortic outflow tract and mid peaking murmur, no diastolic murmur ABD:  Positive bowel sounds normal in frequency in pitch, no bruits, no rebound, no guarding, unable to assess midline mass or bruit with the patient seated. EXT:  2 plus pulses throughout, moderate edema, no cyanosis no clubbing NEURO:  Cranial nerves II through XII grossly intact, motor grossly intact throughout  EKG:  Sinus rhythm, rate 60, first degree AV block, no acute ST-T wave changes.  05/21/2012  ASSESSMENT AND PLAN   CAD (coronary artery disease) -  The patient has no new sypmtoms. No further cardiovascular testing is indicated. We will continue with aggressive risk reduction and meds as listed.   Aortic stenosis -  His peak gradient is 66 with a mean of 44.  This is increased compared with previous.  However in the absence of symptoms no further intervention is indicated. I reviewed the symptoms with the patient today. On his next visit I will have him see Dr. Excell Seltzer in the valve clinic. For now he will remain on the medications as listed.  SVT (supraventricular tachycardia) -  He has had no symptomatic arrhythmias. He will continue with medications as listed. He is up-to-date with followup of his labs on amiodarone.

## 2012-05-21 NOTE — Patient Instructions (Addendum)
Your physician recommends that you schedule a follow-up appointment in: 3 months in the valve clinic with Dr Excell Seltzer

## 2012-05-21 NOTE — Progress Notes (Signed)
Echocardiogram performed.  

## 2012-05-23 ENCOUNTER — Other Ambulatory Visit: Payer: Self-pay | Admitting: *Deleted

## 2012-05-23 DIAGNOSIS — I359 Nonrheumatic aortic valve disorder, unspecified: Secondary | ICD-10-CM

## 2012-05-27 ENCOUNTER — Other Ambulatory Visit: Payer: Self-pay | Admitting: *Deleted

## 2012-05-27 MED ORDER — CARVEDILOL 6.25 MG PO TABS: 6.2500 mg | ORAL_TABLET | Freq: Two times a day (BID) | ORAL | Status: DC

## 2012-06-09 ENCOUNTER — Other Ambulatory Visit: Payer: Self-pay | Admitting: Internal Medicine

## 2012-06-20 ENCOUNTER — Telehealth: Payer: Self-pay | Admitting: Cardiovascular Disease

## 2012-06-20 NOTE — Telephone Encounter (Signed)
I will place this order in Dr Hochrein's box for review.   This pt only had a cardiac cath by Dr Excell Seltzer in February.  The pt's primary Cardiologist is Dr Antoine Poche.

## 2012-06-20 NOTE — Telephone Encounter (Signed)
Calling re status of request for c-pap machine for pt, resent it and it is in box outside of scheduling for med records to bring to [facility]

## 2012-06-23 ENCOUNTER — Telehealth: Payer: Self-pay | Admitting: *Deleted

## 2012-06-23 NOTE — Telephone Encounter (Signed)
Called Eugene [name] to remind him of TAVR appt on Friday September 20th.  PFTS & 6-minute walk at 12p.  He will see Dr. Excell Seltzer and Dr. Cornelius Moras at 2:00pm.  Answered all of patients questions.

## 2012-06-24 NOTE — Telephone Encounter (Signed)
Paperwork given to Dr Antoine Poche to review

## 2012-06-27 ENCOUNTER — Ambulatory Visit (HOSPITAL_COMMUNITY)
Admission: RE | Admit: 2012-06-27 | Discharge: 2012-06-27 | Disposition: A | Discharge status: Home / Self Care | Payer: Medicare Other | Source: Ambulatory Visit | Attending: Thoracic Surgery (Cardiothoracic Vascular Surgery) | Admitting: Thoracic Surgery (Cardiothoracic Vascular Surgery)

## 2012-06-27 ENCOUNTER — Ambulatory Visit (HOSPITAL_BASED_OUTPATIENT_CLINIC_OR_DEPARTMENT_OTHER)
Admission: RE | Admit: 2012-06-27 | Discharge: 2012-06-27 | Disposition: A | Discharge status: Home / Self Care | Payer: Medicare Other | Source: Ambulatory Visit | Attending: Cardiovascular Disease | Admitting: Cardiovascular Disease

## 2012-06-27 ENCOUNTER — Ambulatory Visit (HOSPITAL_BASED_OUTPATIENT_CLINIC_OR_DEPARTMENT_OTHER)
Admission: RE | Admit: 2012-06-27 | Discharge: 2012-06-27 | Disposition: A | Discharge status: Home / Self Care | Payer: Medicare Other | Source: Ambulatory Visit | Attending: Thoracic Surgery (Cardiothoracic Vascular Surgery) | Admitting: Thoracic Surgery (Cardiothoracic Vascular Surgery)

## 2012-06-27 ENCOUNTER — Encounter (HOSPITAL_COMMUNITY): Payer: Self-pay | Admitting: Cardiovascular Disease

## 2012-06-27 VITALS — BP 97/64 | HR 57 | Resp 20 | Ht 67.0 in | Wt 207.0 lb

## 2012-06-27 DIAGNOSIS — R0989 Other specified symptoms and signs involving the circulatory and respiratory systems: Secondary | ICD-10-CM | POA: Insufficient documentation

## 2012-06-27 DIAGNOSIS — I2510 Atherosclerotic heart disease of native coronary artery without angina pectoris: Secondary | ICD-10-CM

## 2012-06-27 DIAGNOSIS — R0609 Other forms of dyspnea: Secondary | ICD-10-CM | POA: Insufficient documentation

## 2012-06-27 DIAGNOSIS — I359 Nonrheumatic aortic valve disorder, unspecified: Secondary | ICD-10-CM | POA: Insufficient documentation

## 2012-06-27 DIAGNOSIS — I350 Nonrheumatic aortic (valve) stenosis: Secondary | ICD-10-CM

## 2012-06-27 NOTE — Progress Notes (Signed)
6 Minute Walk Test Results  Patient: TRAVARES NELLES Date:  06/27/2012   Supplemental O2 during test? No      Baseline   End  Time   1337    1343 Heartrate  56    71 Dyspnea  NONE    MILD Fatigue  NONE    MILD O2 sat   92%    94% Blood pressure 94/62    102/58   Patient ambulated at a normal pace for a total distance of 1500 feet with 2 brief stops with a rolling walker.  Ambulation was limited primarily due to mild shortness of breath and some weakness.  Overall the test was tolerated well.  Pulmonary Function Tests  Baseline-Pre-bronchodilator      Post-bronchodilator-not completed  FVC  2.76 L  (84% predicted)  FEV1  2.05 L  (91% predicted)  FEF25-75 1.45 L  (103% predicted)   RV  2.73 L  (104% predicted) DLCO  65% predicted  STS Risk Calculator  Procedure    EVAL FOR TAVR  Risk of Mortality   2.770% Morbidity or Mortality  19.083% Prolonged LOS   7.486% Short LOS    25.638% Permanent Stroke   2.047% Prolonged Vent Support  10.941% DSW Infection    0.298% Renal Failure    5.241% Reoperation    7.741%    Review of Systems:   General:  NO loss of appetite, NO decreased energy, NO weight gain, NO weight loss, NO fever  Cardiac:  NO chest pain with exertion, NO chest pain at rest, NO SOB with YES exertion, YES resting SOB, NO PND, NO orthopnea, NO palpitations, NO arrhythmia, NO atrial fibrillation, NO LE edema, NO dizzy spells, NO syncope  Respiratory:  NO shortness of breath, NO home oxygen, NO productive cough, NO dry cough, NO bronchitis, NO wheezing, NO hemoptysis, NO asthma, NO pain with inspiration or cough, NO sleep apnea, NO CPAP at night  GI:   NO difficulty swallowing, NO reflux, NO frequent heartburn, NO hiatal hernia, NO abdominal pain, NO constipation, NO diarrhea, NO hematochezia, NO hematemesis, NO melena  GU:   NO dysuria,  YES frequency, NO urinary tract infection, NO hematuria, NO enlarged prostate, NO kidney stones, NO kidney  disease  Vascular:  NO pain suggestive of claudication, NO pain in feet, NO leg cramps, NO varicose veins, NO DVT, NO non-healing foot ulcer  Neuro:   NO stroke, NO TIA's, NO seizures, NO headaches, NOtemporary blindness one eye,  NO slurred speech, NO peripheral neuropathy, NO chronic pain, NO instability of gait, NO memory/cognitive dysfunction  Musculoskeletal: NO arthritis, NO joint swelling, NO myalgias, NO difficulty walking, NO mobility   Skin:   NO rash, NO itching, NO skin infections, NO pressure sores or ulcerations  Psych:   NO anxiety, NO depression, NO nervousness, NO unusual recent stress  Eyes:   NO blurry vision, NO floaters, NO recent vision changes, YES wears glasses or contacts  ENT:   NO hearing loss, NO loose or painful teeth, NO dentures, last saw dentist SPRING 2013  Hematologic:  NO easy bruising, NO abnormal bleeding, NOclotting disorder, NO frequent epistaxis  Endocrine:  NO diabetes

## 2012-06-27 NOTE — Progress Notes (Addendum)
[facility]   MULTIDISCIPLINARY HEART VALVE CLINIC       CARDIOTHORACIC SURGERY NOTE  Referring Provider is Tonny Bollman, MD PCP is Tacey Heap, MD   HPI:  Patient returns to the multidisciplinary heart valve clinic for follow up of severe aortic stenosis.  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 The patient's cardiac history dates back to 42 when he suffered an acute myocardial infarction. He was treated with primary angioplasty.  The patient has had known history of aortic stenosis with previous echocardiogram from 2010 demonstrating mild aortic stenosis  (AVA estimated 1.64cm2, mean gradient 16 mmHg) and ejection fraction 50-55%. He also had episodes of supraventricular tachycardia which were treated medically.  On October 31, 2011 the patient suffered a sudden syncopal episode while he was carrying in groceries from the car to his home. He fell and hit his head and was unconscious for at least 20 minutes. A friend was present and summoned EMS. The patient was brought directly to the emergency room where he arrived in sinus rhythm.  He ruled out for acute myocardial infarction. Echocardiogram demonstrated severe aortic stenosis with moderate to severe left ventricular dysfunction. Left heart catheterization was performed demonstrating severe three-vessel coronary artery disease. I had the opportunity to evaluate the patient during that hospitalization, and we discussed possible high-risk aortic valve replacement and coronary artery bypass grafting versus less invasive and palliative means of treating his aortic stenosis and coronary artery disease at that time.  The patient had a somewhat complicated hospital course for a variety of reasons, and mental status waxed and waned a bit. A decision was made to treat him with primary balloon valvuloplasty  and PCI with bare-metal stent to the high-grade proximal stenosis of the left anterior descending coronary artery.  The patient did remarkably well after these procedures, although he did suffer a lower GI bleed felt likely related to diverticular disease.  He ultimately recovered and he returns to the clinic today for further followup.  Patient reports that he feels better than he has in quite some time. He reports no problems getting back to his previous level of activity and he is living independently at home, although he is disappointed that he is still not been given the okay to drive his car. He denies any problems with exertional chest pain or shortness of breath. He has not had any dizzy spells, tachypalpitations, nor syncope.  He states that he had one episode of exertional chest pain last April, and another brief episode of vague discomfort across his chest 2 or 3 weeks ago that was different and not described as discomfort or pressure but more as though he felt he had some indigestion. He has not had any PND, orthopnea, nor lower extremity edema. He denies any melena, hematochezia, nor hematemesis.      Past Medical History  Diagnosis Date  . Glaucoma     Right eye  . Diverticulosis     multiple hospital admissions for GI bleed, has an episode roughly every 6 months,, last  colonscopy in 2006 showed diverticulosis, was evaluated by Dr. Madilyn Fireman in the hospital in 2011 for lower GI bleed andd it  was suggested that the patient was not actively bleeding at that time and given his comorbid conditions colonoscopy was deferred. He is also chronically constipated  . Macular degeneration of right eye   .  Allergic rhinitis   . CAD (coronary artery disease)     s/p PCI in 1991;  Cardiac catheterization 11/02/11: Ostial LAD 95% with aneurysmal dilatation after this, proximal circumflex 70% with a superior branch 30-40%, proximal RCA 75% and 90%, mid RCA 50% and 90% before a PDA, then occluded.;    high  risk for CABG;PCI 11/14/11: Bare-metal stent to the proximal LAD    . OSA on CPAP   . Hypertension   . Hyperlipidemia   . Chronic systolic heart failure     Echocardiogram 11/02/11: Moderate LVH, EF 30-35%, multiple wall motion abnormalities, critical aortic stenosis, AVA 0.6, mean gradient 45, mild MR, severe LAE  . Supraventricular tachycardia     2 syndromes-nonsustained atrial tachycardia//adenosine responsive diuretic positive reentry probably AV node reentry  . Aortic stenosis     s/p AV balloon valvuloplasty by Dr. Excell Seltzer 11/2011  . Ischemic cardiomyopathy   . H/O: GI bleed     recurrent  . Heart murmur   . CHF (congestive heart failure)     Past Surgical History  Procedure Date  . Implantation of [place] glaucoma device lant with scleral reinforcement using tutoplast tissue graft right eye.   . Coronary angioplasty with stent placement   . Corneal transplant     No family history on file.  Social History History  Substance Use Topics  . Smoking status: Former Smoker    Types: Cigarettes    Quit date: 10/08/1960  . Smokeless tobacco: Never Used  . Alcohol Use: No    Current Outpatient Prescriptions  Medication Sig Dispense Refill  . amiodarone (PACERONE) 200 MG tablet Take 200 mg by mouth daily.      Marland Kitchen amLODipine (NORVASC) 2.5 MG tablet Take 1 tablet (2.5 mg total) by mouth daily.  30 tablet  11  . aspirin 81 MG tablet Take 81 mg by mouth daily.        . Calcium Carbonate-Vitamin D (CALCIUM + D PO) Take by mouth daily.      . carvedilol (COREG) 6.25 MG tablet Take 1 tablet (6.25 mg total) by mouth 2 (two) times daily.  60 tablet  11  . docusate sodium (COLACE) 100 MG capsule Take 100 mg by mouth 2 (two) times daily.       . ferrous sulfate 325 (65 FE) MG tablet Take 325 mg by mouth daily with breakfast.      . furosemide (LASIX) 40 MG tablet Take 40 mg by mouth daily.      Marland Kitchen levothyroxine (LEVOTHROID) 25 MCG tablet Take 1 tablet (25 mcg total) by mouth daily.  30  tablet  11  . losartan (COZAAR) 50 MG tablet Take 1 tablet (50 mg total) by mouth daily.  30 tablet  4  . loteprednol (LOTEMAX) 0.5 % ophthalmic suspension Place 1 drop into the right eye every morning.       . magnesium hydroxide (MILK OF MAGNESIA) 400 MG/5ML suspension Take 30 mLs by mouth daily as needed. For constipation      . pantoprazole (PROTONIX) 40 MG tablet TAKE 1 TABLET BY MOUTH EVERY DAY  30 tablet  3  . pravastatin (PRAVACHOL) 40 MG tablet Take 1 tablet (40 mg total) by mouth every evening.  30 tablet  11  . spironolactone (ALDACTONE) 25 MG tablet Take 12.5 mg by mouth every morning.      . travoprost, benzalkonium, (TRAVATAN) 0.004 % ophthalmic solution Place 1 drop into the right eye at bedtime.  Allergies  Allergen Reactions  . Penicillins     REACTION: "turns red"    Review of Systems:              General:                      NO loss of appetite, NO decreased energy, NO weight gain, NO weight loss, NO fever             Cardiac:                      NO chest pain with exertion, NO chest pain at rest, NO SOB with YES exertion, YES resting SOB, NO PND, NO orthopnea, NO palpitations, NO arrhythmia, NO atrial fibrillation, NO LE edema, NO dizzy spells, NO syncope             Respiratory:                NO shortness of breath, NO home oxygen, NO productive cough, NO dry cough, NO bronchitis, NO wheezing, NO hemoptysis, NO asthma, NO pain with inspiration or cough, NO sleep apnea, NO CPAP at night             GI:                                NO difficulty swallowing, NO reflux, NO frequent heartburn, NO hiatal hernia, NO abdominal pain, NO constipation, NO diarrhea, NO hematochezia, NO hematemesis, NO melena             GU:                              NO dysuria,  YES frequency, NO urinary tract infection, NO hematuria, NO enlarged prostate, NO kidney stones, NO kidney disease             Vascular:                     NO pain suggestive of claudication, NO pain in feet,  NO leg cramps, NO varicose veins, NO DVT, NO non-healing foot ulcer             Neuro:                         NO stroke, NO TIA's, NO seizures, NO headaches, NOtemporary blindness one eye,  NO slurred speech, NO peripheral neuropathy, NO chronic pain, NO instability of gait, NO memory/cognitive dysfunction             Musculoskeletal:         NO arthritis, NO joint swelling, NO myalgias, NO difficulty walking, NO mobility               Skin:                            NO rash, NO itching, NO skin infections, NO pressure sores or ulcerations             Psych:                         NO anxiety, NO depression, NO nervousness, NO unusual recent stress  Eyes:                           NO blurry vision, NO floaters, NO recent vision changes, YES wears glasses or contacts             ENT:                            NO hearing loss, NO loose or painful teeth, NO dentures, last saw dentist SPRING 2013             Hematologic:               NO easy bruising, NO abnormal bleeding, NOclotting disorder, NO frequent epistaxis             Endocrine:                   NO diabetes       Physical Exam:   There were no vitals taken for this visit.  General:  Elderly and somewhat frail-appearing  HEENT:  Unremarkable   Neck:   no JVD, no bruits, no adenopathy   Chest:   clear to auscultation, symmetrical breath sounds, no wheezes, no rhonchi   CV:   RRR, grade III/VI crescendo/decrescendo murmur heard best at sternal border,  no diastolic murmur  Abdomen:  soft, non-tender, no masses   Extremities:  warm, well-perfused, pulses not palpable, no LE edema  Rectal/GU  Deferred  Neuro:   Grossly non-focal and symmetrical throughout  Skin:   Clean and dry, no rashes, no breakdown   Diagnostic Tests:  Procedure: Left Heart Cath, Selective Coronary Angiography, LV angiography  Indication:  Ischemic cardiomyopathy, AS, questionable syncope  Procedural details: The right groin was prepped, draped,  and anesthetized with 1% lidocaine. Using modified Seldinger technique, a 5 French sheath was introduced into the right femoral artery. Standard Judkins catheters were used for coronary angiography and left ventriculography. Catheter exchanges were performed over a guidewire. There were no immediate procedural complications. The patient was transferred to the post catheterization recovery area for further monitoring.  Of note I did try multiple catheters including an Amplatz left with a straight long exchange wire to cross the aortic valve. I was unsuccessful.  Procedural Findings:             Hemodynamics:                                      AO 128/69                                     LV  NA              Coronary angiography:  Coronary dominance: Right  Left mainstem:   Luminal irregularities  Left anterior descending (LAD):   Ostial 95% stenosis. There is an aneurysmal dilatation immediately following this. The LAD otherwise has diffuse nonobstructive plaque. The LAD does feed collaterals to an occluded large posterior lateral. Mid diagonal is small to moderate sized and free of high-grade disease.  Ramus intermedius:  Very large vessel with mild luminal irregularities.  Left circumflex (LCx):  Circumflex in the AV groove happens proximal luminal irregularities. There is a branching mid obtuse marginal. There  appears to be a proximal 70% stenosis prior to bifurcation. The remainder of the vessel is free of high-grade disease although there is a superior branch 30-40% stenosis.  Right coronary artery (RCA):  Severe diffuse disease. There is proximal 75 followed by 90% stenosis. There is diffuse mid heavy calcification with tandem focal 50% lesions. There is a long 90% stenosis before a small PDA. The vessel is then occluded before the large posterior lateral with collaterals as described.  Left ventriculography: I was unable to cross the aortic valve.      Cardiac Catheterization  Procedure Note  Name: ZACKERIAH SHATZER MRN: [id number] DOB: Mar 03, 1924  Procedure: Right heart catheterization, temporary transvenous pacemaker placement, left ventriculography, balloon aortic valvuloplasty.  Indication: Severe aortic stenosis with congestive heart failure           Procedural details: The right groin was prepped, draped, and anesthetized with 1% lidocaine. Using modified Seldinger technique, a 5 French sheath was introduced into the right femoral artery. A 7 French sheath was introduced into the right femoral vein. A right femoral artery angiogram was performed and this showed access into the common femoral artery and a good site. 2 Perclose devices were deployed. The sheath was then upsized to a 12 [place]. A Swan-Ganz catheter was advanced in the right heart catheterization was performed. The Swan-Ganz catheter was removed and a temporary transvenous pacing wire was advanced into the right ventricular apex. At that point attention was turned to the aortic valve. The pacing threshold had been determined that 0.4 milliamps. Using an AL-1 catheter, a straight wire was advanced across the aortic valve without much difficulty into the LV apex. A pigtail catheter was used to cross the valve and left ventriculography was performed.  I tried to advance an Amplatz superstiff J-wire into the LV apex but I could not get positioned properly. Several attempts were made and the pigtail catheter was used as well. Pad across the valve on a few different occasions in order to get proper position. Ultimately, I used a straight Amplatz wire with a curved and was able to place it in the LV apex in good position. At that point a 22 x 50 mm Nucleus balloon was advanced across the aortic valve. The patient was paced at 180 beats per minute. Double and was inflated by hand pressure on 2 separate occasions. The patient tolerated the inflations well. There were no immediate complications in his blood pressure  returned to normal fairly quickly. The 12 French sheath had to be replaced in the right femoral artery because it was difficult to get the balloon now. This was changed out to a new 12 French sheath over the Amplatz stiff wire. The aortic valve was crossed one more time and simultaneous pressures were recorded. A pullback across the aortic valve was done. The pigtail catheter was removed, the temporary pacemaker wire was removed, and the Perclose devices were deployed for femoral hemostasis. There were no immediate procedural complications.  Procedural Findings: Hemodynamics:  RA 3 RV 30/4 PA 27/8 with a mean of 21   pulmonary capillary wedge pressure 9 AO 130/57 LV 168/13  Post balloon valvuloplasty AO 138/61 LV 163/9              Left ventriculography: There is severe hypokinesis/akinesis of the basal and mid inferior walls. The anterior wall contracts normally. There is mild mitral regurgitation. There is heavy calcification of the aortic valve and coronary arteries.   CARDIAC CATH NOTE  Name: Deuntay  APOLO CUTSHAW MRN: [id number] DOB: [birth year]  Procedure: PTCA and stenting of the proximal LAD, Perclose of the left femoral artery.  Indication: This is an 78 year old gentleman with complex cardiac disease. He has ischemic cardiomyopathy, severe aortic stenosis, and severe coronary disease. He underwent balloon aortic valvuloplasty earlier this week and presents today for percutaneous intervention of the proximal LAD.  Procedural Details:  The right wrist was prepped draped and anesthetized with 1% lidocaine. Utilizing the modified Seldinger technique, a 6 French sheath was placed in the right radial artery. Verapamil was administered through the sheath. There was a great deal of tortuosity beginning at the level of the elbow all the way to the aorta. I tried multiple guide catheters but was unable to selectively engage the left main stem. At that point, I shifted access to the left femoral  artery. The right femoral artery had been utilized for the patient's oculoplasty procedure. Access was obtained via a front wall puncture. A 6 French sheath was introduced. I still was unable to engage the left main using multiple guide catheters. Ultimately, I changed out to a long 6 French sheath and was able to engage the left mainstem with a JL 4.5 cm guide. Angiomax was then started for anticoagulation. A therapeutic ACT was achieved. The patient had been adequately preloaded with Plavix. Initially, I attempted to wire the LAD with a cougar guidewire. The lesion in the proximal LAD was extremely tortuous and calcified in a wire would not cross. I left a cougar wire and the intermediate branch and used a whisper wire which successfully navigated the proximal LAD stenosis. I was able to advance a 2.5 x 12 mm sprinter legend balloon across the lesion it was inflated to 10 atmospheres. I then attempted a 3.0 x 12 mm sprinter but this would not cross. Noncompliant balloons would also not cross. At that point I rewired the LAD with a cougar guidewire for better support. I was eventually able to cross the lesion with a 3.0 x 12 mm sprinter balloon and it was dilated to 12 atmospheres with good expansion. The vessel was then stented with an integrity bare-metal stent. A 3.0 x 15 mm stent platform was utilized and the stent was deployed at 14 atmospheres with good expansion. The stent was postdilated with a 3.5 x 12 mm [place] sprinter balloon. There was an excellent angiographic result the completion of the procedure was 0% residual stenosis and TIMI-3 flow. The left femoral artery was closed with a Perclose device.       Transthoracic Echocardiography  Patient:    [place], Boyett MR #:       [id number] Study Date: 05/21/2012 Gender:     M Age:        87 Height:     170.2cm Weight:     93kg BSA:        2.54m^2 Pt. Status: Room:    ATTENDING    Nahser, Ellyn Hack  REFERRING     Hochrein, [name]  PERFORMING   Redge Gainer, [facility]  SONOGRAPHER  Junious Dresser, RDCS cc:  ------------------------------------------------------------ LV EF: 45% -   50%  ------------------------------------------------------------ Indications:      424.1 Aortic valve disorders. Cardiomyopathy - ischemic 414.8.  ------------------------------------------------------------ History:   PMH:  Acquired from the patient and from the patient's chart. Apical systolic murmur radiating out the aortic outflow tract and mid peaking.  Supraventricular tachycardia.  Coronary artery disease.  Ischemic cardiomyopathy, with  congestive heart failure, with an ejection fraction of 35%by echocardiography. The dysfunction is primarily systolic.  Severe aortic stenosis.  Risk factors:  Hypertension. Obese. Dyslipidemia.  ------------------------------------------------------------ Study Conclusions  - Left ventricle: The cavity size was moderately dilated.   Wall thickness was increased in a pattern of mild LVH.   There was focal basal hypertrophy. Systolic function was   mildly reduced. The estimated ejection fraction was in the   range of 45% to 50%. Doppler parameters are consistent   with abnormal left ventricular relaxation (grade 1   diastolic dysfunction). - Aortic valve: There was severe stenosis. Mild   regurgitation. Mean gradient: 46mm Hg (S). Peak gradient:   76mm Hg (S). - Left atrium: The atrium was moderately dilated. - Pulmonary arteries: PA peak pressure: 31mm Hg (S). - Pericardium, extracardiac: A trivial pericardial effusion   was identified.  ------------------------------------------------------------ Labs, prior tests, procedures, and surgery: Echocardiography (February 2013).    The aortic valve showed severe stenosis.  EF was 35%. Aortic valve: peak gradient of 56mm Hg and mean gradient of 36mm Hg.  Catheterization.    There was a stenosis which was treated with a  stent. Valve surgery (February 2013).    Aortic balloon valvuloplasty. Transthoracic echocardiography.  M-mode, complete 2D, spectral Doppler, and color Doppler.  Height:  Height: 170.2cm. Height: 67in.  Weight:  Weight: 93kg. Weight: 204.6lb.  Body mass index:  BMI: 32.1kg/m^2.  Body surface area:    BSA: 2.27m^2.  Blood pressure:     130/75.  Patient status:  Outpatient.  Location:  [place] [facility]  ------------------------------------------------------------  ------------------------------------------------------------ Left ventricle:  The cavity size was moderately dilated. Wall thickness was increased in a pattern of mild LVH. There was focal basal hypertrophy. Systolic function was mildly reduced. The estimated ejection fraction was in the range of 45% to 50%. Doppler parameters are consistent with abnormal left ventricular relaxation (grade 1 diastolic dysfunction).   ------------------------------------------------------------ Aortic valve:   Mildly thickened, mildly calcified leaflets.  Doppler:   There was severe stenosis.    Mild regurgitation.    VTI ratio of LVOT to aortic valve: 0.22. Valve area: 1.18cm^2(VTI). Indexed valve area: 0.58cm^2/m^2 (VTI). Peak velocity ratio of LVOT to aortic valve: 0.2. Valve area: 1.08cm^2 (Vmax). Indexed valve area: 0.53cm^2/m^2 (Vmax).    Mean gradient: 46mm Hg (S). Peak gradient: 76mm Hg (S).  ------------------------------------------------------------ Mitral valve:   Structurally normal valve.   Leaflet separation was normal.  Doppler:  Transvalvular velocity was within the normal range. There was no evidence for stenosis.  No regurgitation.  ------------------------------------------------------------ Left atrium:  The atrium was moderately dilated.  ------------------------------------------------------------ Right ventricle:  The cavity size was normal. Wall thickness was normal. Systolic function was  normal.  ------------------------------------------------------------ Pulmonic valve:    Doppler:   Trivial regurgitation.  ------------------------------------------------------------ Tricuspid valve:   Doppler:   Trivial regurgitation.  ------------------------------------------------------------ Right atrium:  The atrium was normal in size.  ------------------------------------------------------------ Pericardium:  A trivial pericardial effusion was identified.  ------------------------------------------------------------  2D measurements        Normal  Doppler measurements   Normal Left ventricle                 Main pulmonary LVID ED,   54.7 mm     43-52   artery chord,                         Pressure,    31 mm Hg  =30 PLAX  S LVID ES,   48.6 mm     23-38   Left ventricle chord,                         Ea, lat    3.29 cm/s   ------ PLAX                           ann, tiss FS, chord,   11 %      >29     DP PLAX                           E/Ea, lat  20.5        ------ LVPW, ED   9.05 mm     ------  ann, tiss     5 IVS/LVPW   2.07        <1.3    DP ratio, ED                      Ea, med    3.51 cm/s   ------ Ventricular septum             ann, tiss IVS, ED    18.7 mm     ------  DP LVOT                           E/Ea, med  19.2        ------ Diam, S      26 mm     ------  ann, tiss     6 Area       5.31 cm^2   ------  DP Diam         26 mm     ------  LVOT Aorta                          Peak vel,  88.7 cm/s   ------ Root diam,   39 mm     ------  S ED                             VTI, S       24 cm     ------ AAo AP       33 mm     ------  Stroke vol 127. ml     ------ diam, S                                      4 Left atrium                    Stroke     62.5 ml/m^2 ------ AP dim       49 mm     ------  index AP dim      2.4 cm/m^2 <2.2    Aortic valve index                          Peak vel,   437 cm/s   ------  S                                Mean vel,   319 cm/s   ------                                S                                VTI, S      108 cm     ------                                Mean         46 mm Hg  ------                                gradient,                                S                                Peak         76 mm Hg  ------                                gradient,                                S                                VTI ratio  0.22        ------                                LVOT/AV                                Area, VTI  1.18 cm^2   ------                                Area index 0.58 cm^2/m ------                                (VTI)           ^2                                Peak vel    0.2        ------  ratio,                                LVOT/AV                                Area, Vmax 1.08 cm^2   ------                                Area index 0.53 cm^2/m ------                                (Vmax)          ^2                                Regurg PHT  795 ms     ------                                Mitral valve                                Peak E vel 67.6 cm/s   ------                                Peak A vel 91.8 cm/s   ------                                Decelerati  563 ms     150-23                                on time                0                                Peak E/A    0.7        ------                                ratio                                Tricuspid valve                                Regurg      253 cm/s   ------                                peak vel  Peak RV-RA   26 mm Hg  ------                                gradient,                                S                                Systemic veins                                Estimated     5 mm Hg  ------                                CVP                                 Right ventricle                                Pressure,    31 mm Hg  <30                                S                                Sa vel,    10.2 cm/s   ------                                lat ann,                                tiss DP   ------------------------------------------------------------ Prepared and Electronically Authenticated by  Kristeen Miss 2013-08-14T12:53:[id number]        6 Minute Walk Test Results  Patient:            MORRILL BOMKAMP Date:               06/27/2012   Supplemental O2 during test?            No                                       Baseline                                 End  Time                            1337  1343 Heartrate                     56                                            71 Dyspnea                      NONE                                      MILD Fatigue                        NONE                                      MILD O2 sat                         92%                                         94% Blood pressure           94/62                                       102/58   Patient ambulated at a normal pace for a total distance of 1500 feet with 2 brief stops with a rolling walker.  Ambulation was limited primarily due to mild shortness of breath and some weakness.  Overall the test was tolerated well.  Pulmonary Function Tests  Baseline-Pre-bronchodilator                                                               Post-bronchodilator-not completed  FVC                 2.76 L  (84% predicted)           FEV1               2.05 L  (91% predicted)           FEF25-75        1.45 L  (103% predicted)          RV                   2.73 L  (104% predicted) DLCO              65% predicted    Impression:  The patient has severe aortic stenosis with mild to moderate left ventricular dysfunction and two-vessel coronary artery disease. He underwent primary balloon  valvuloplasty of aortic valve and PCI and stenting of the high-grade proximal left anterior descending coronary artery stenosis last February and has  done remarkably well ever since. Followup echocardiogram demonstrates that his gradient across the aortic valve is already back to a similar level that it was prior to valvuloplasty.  He seems to be doing remarkably well at this time and is essentially asymptomatic, although he did originally presented with syncope last January. At this time he looks much better than he did at the time of his presentation last January, and he seems to have recovered to his previous functional status. However, there is no question that conventional surgical intervention would be associated with very high-risk because of his age and comorbidities.  Transcatheter aortic valve replacement could be considered as an alternative any upon findings on repeat catheterization with regards to his underlying coronary artery disease.  The patient was seen in consultation with Dr. Excell Seltzer in his most recent echocardiogram and previous diagnostic cardiac catheterization were both reviewed in detail.   Plan:  Mr. Rathgeber hopes to avoid any type of aggressive surgical intervention if possible. Given that fact that he seems to be essentially asymptomatic at this time this seems reasonable.  However, the risks of syncope and sudden death are difficult to estimate.  Because of this I support the recommendation that he should not resume driving an automobile. We will plan to followup with Dr. Excell Seltzer in the office within the next 3-6 months. All of his questions been addressed.    Salvatore Decent. Cornelius Moras, MD 06/27/2012 5:53 PM

## 2012-06-27 NOTE — Progress Notes (Signed)
MULTIDISCIPLINARY HEART VALVE CLINIC NOTE  Patient ID: RAEES [name] MRN: [id number] DOB/AGE: 1923-11-19 78 y.o.  Primary Care Physician:NUTAN, FNU, MD Primary Cardiologist: DR Eugene [name]  HPI: 78 year old gentleman with severe aortic stenosis presenting for evaluation. Eugene [name] presented in January 2013 after a syncopal episode. He has been followed for several years for supraventricular tachycardia. He also has a known history of coronary artery disease with myocardial infarction in the early 1990s for which he underwent primary angioplasty.   During that hospitalization,uring that hospital stay, he was diagnosed with severe aortic stenosis. His left ventricular ejection fraction was estimated to be 30-35% with akinesis of the basal to mid inferior wall and akinesis of the lateral wall. He had critical stenosis with a valve area of 0.6 and mean and peak gradients of 45 and 67 mmHg. He underwent balloon aortic valvuloplasty and his mean and peak gradients were reduced to 36 and 56 mmHg, respectively. He had a followup echocardiogram last month that demonstrated improvement in his left ventricular ejection fraction, now up to 45-50%. His transaortic valve gradients are back to his original baseline with a mean gradient of 46 and peak gradient of 76 mm mercury.  From a symptomatic perspective, he feels better than he has in some time. He spent a short time in a skilled nursing facility after his hospitalization in January and again in March. However, he has been independent at home since April. He has not been able to drive, but does get out of the home and he is able to do his activities of daily living without cardiopulmonary symptoms. He specifically denies exertional chest pain or pressure, dyspnea, lightheadedness, or syncope. He had an episode of chest discomfort about 2 weeks ago that occurred when he was lying in bed. This lasted about 5 minutes and resolved spontaneously without any intervention.  He has had no other episodes of chest pain or shortness of breath.  He also denies orthopnea, PND, or palpitations. He's had no further signs of GI bleeding and he denies melena, hematochezia, or any recent change in his bowel habits.  Past Medical History  Diagnosis Date  . Glaucoma     Right eye  . Diverticulosis     multiple hospital admissions for GI bleed, has an episode roughly every 6 months,, last  colonscopy in 2006 showed diverticulosis, was evaluated by Eugene [name] in the hospital in 2011 for lower GI bleed andd it  was suggested that the patient was not actively bleeding at that time and given his comorbid conditions colonoscopy was deferred. He is also chronically constipated  . Macular degeneration of right eye   . Allergic rhinitis   . CAD (coronary artery disease)     s/p PCI in 1991;  Cardiac catheterization 11/02/11: Ostial LAD 95% with aneurysmal dilatation after this, proximal circumflex 70% with a superior branch 30-40%, proximal RCA 75% and 90%, mid RCA 50% and 90% before a PDA, then occluded.;    high risk for CABG;PCI 11/14/11: Bare-metal stent to the proximal LAD    . OSA on CPAP   . Hypertension   . Hyperlipidemia   . Chronic systolic heart failure     Echocardiogram 11/02/11: Moderate LVH, EF 30-35%, multiple wall motion abnormalities, critical aortic stenosis, AVA 0.6, mean gradient 45, mild MR, severe LAE  . Supraventricular tachycardia     2 syndromes-nonsustained atrial tachycardia//adenosine responsive diuretic positive reentry probably AV node reentry  . Aortic stenosis     s/p AV balloon valvuloplasty by  Eugene [name] 11/2011  . Ischemic cardiomyopathy   . H/O: GI bleed     recurrent  . Heart murmur   . CHF (congestive heart failure)     Past Surgical History  Procedure Date  . Implantation of baerveldt glaucoma device lant with scleral reinforcement using tutoplast tissue graft right eye.   . Coronary angioplasty with stent placement   . Corneal transplant       No family history on file.  History   Social History  . Marital Status: Widowed    Spouse Name: N/A    Number of Children: N/A  . Years of Education: N/A   Occupational History  . Not on file.   Social History Main Topics  . Smoking status: Former Smoker    Types: Cigarettes    Quit date: 10/08/1960  . Smokeless tobacco: Never Used  . Alcohol Use: No  . Drug Use: No  . Sexually Active: No   Other Topics Concern  . Not on file   Social History Narrative   Lives at the [facility]. He has a daughter in [place] and a health-care power of attorney. He also has a close friend who takes him for his doctor's appointments and is with him during the hospital admission. 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 Never smoked or drank alcohol in past 40 years. No illicit drug use. Has Medicare.      (Not in a hospital admission)  Allergies  Allergen Reactions  . Penicillins     REACTION: "turns red"    ROS:  General: no fevers/chills/night sweats Eyes: no blurry vision, diplopia, or amaurosis ENT: no sore throat or hearing loss Resp: no cough, wheezing, or hemoptysis CV: no edema or palpitations GI: no abdominal pain, nausea, vomiting, diarrhea, or constipation GU: no dysuria, positive for frequent urination Skin: no rash Neuro: no headache, numbness, tingling, or weakness of extremities Musculoskeletal: no joint pain or swelling Heme: no bleeding, DVT, or easy bruising Endo: no polydipsia or polyuria  BP 97/64  Pulse 57  Resp 20  Ht 5\' 7"  (1.702 m)  Wt 207 lb (93.895 kg)  BMI 32.42 kg/m2  SpO2 92%  PHYSICAL EXAM: Pt is alert and oriented, pleasant elderly male, in no distress. HEENT: normal Neck: JVP normal. Carotid upstrokes normal with bilateral bruits and delayed carotid upstrokes. No thyromegaly. Lungs: equal expansion, clear bilaterally CV: Apex is discrete and nondisplaced, RRR with grade 3/6  harsh systolic murmur at the LSB. Abd: soft, NT, +BS, no bruit, no hepatosplenomegaly Back: no CVA tenderness Ext: 2+ pretibial edema bilaterally        DP/PT pulses intact and = Skin: warm and dry without rash Neuro: CNII-XII intact             Strength intact = bilaterally  2D ECHO: Study Conclusions  - Left ventricle: The cavity size was moderately dilated. Wall thickness was increased in a pattern of mild LVH. There was focal basal hypertrophy. Systolic function was mildly reduced. The estimated ejection fraction was in the range of 45% to 50%. Doppler parameters are consistent with abnormal left ventricular relaxation (grade 1 diastolic dysfunction). - Aortic valve: There was severe stenosis. Mild regurgitation. Mean gradient: 46mm Hg (S). Peak gradient: 76mm Hg (S). - Left atrium: The atrium was moderately dilated. - Pulmonary arteries: PA peak pressure: 31mm Hg (S). - Pericardium, extracardiac: A trivial pericardial effusion was identified.  CARDIAC CATH: October 31, 2011: Left mainstem: Luminal irregularities  Left anterior descending (LAD): Ostial 95% stenosis. There is an aneurysmal dilatation immediately following this. The LAD otherwise has diffuse nonobstructive plaque. The LAD does feed collaterals to an occluded large posterior lateral. Mid diagonal is small to moderate sized and free of high-grade disease.  Ramus intermedius: Very large vessel with mild luminal irregularities.  Left circumflex (LCx): Circumflex in the AV groove happens proximal luminal irregularities. There is a branching mid obtuse marginal. There appears to be a proximal 70% stenosis prior to bifurcation. The remainder of the vessel is free of high-grade disease although there is a superior branch 30-40% stenosis.  Right coronary artery (RCA): Severe diffuse disease. There is proximal 75 followed by 90% stenosis. There is diffuse mid heavy calcification with tandem focal 50% lesions. There is a long  90% stenosis before a small PDA. The vessel is then occluded before the large posterior lateral with collaterals as described.   6 Minute Walk Test Results  Patient: Eugene [name]  Date: 06/27/2012  Supplemental O2 during test? No  Baseline End  Time 1337 1343  Heartrate 56 71  Dyspnea NONE MILD  Fatigue NONE MILD  O2 sat 92% 94%  Blood pressure 94/62 102/58  Patient ambulated at a normal pace for a total distance of 1500 feet with 2 brief stops with a rolling walker.  Ambulation was limited primarily due to mild shortness of breath and some weakness.  Overall the test was tolerated well.  Pulmonary Function Tests  Baseline-Pre-bronchodilator Post-bronchodilator-not completed  FVC 2.76 L (84% predicted)  FEV1 2.05 L (91% predicted)  FEF25-75 1.45 L (103% predicted)  RV 2.73 L (104% predicted)  DLCO 65% predicted  STS Risk Calculator  Procedure EVAL FOR TAVR  Risk of Mortality 2.770%  Morbidity or Mortality 19.083%  Prolonged LOS 7.486%  Short LOS 25.638%  Permanent Stroke 2.047%  Prolonged Vent Support 10.941%  DSW Infection 0.298%  Renal Failure 5.241%  Reoperation 7.741%   ASSESSMENT AND PLAN:  1. Severe aortic stenosis. The patient's recent echocardiogram was reviewed and his gradients are elevated again, once again clearly in the severe aortic stenosis range. Despite this, the patient feels much better than he has in some time. He is not limited by exertional dyspnea or chest discomfort. He does not clearly have any symptoms attributable to his aortic stenosis, although he did have frank syncope several months ago now. I have reviewed his echocardiogram images and discussed his case in depth with Dr. Cornelius Moras. Considering his advanced age of 47 years and relative lack of symptoms, it seems most prudent to follow him clinically and see him back in 3 months with a repeat echocardiogram. If the patient becomes more symptomatic or there signs of progressive LV dysfunction by echo, I  would be inclined to move forward with aortic valve intervention. The decision regarding transcatheter aortic valve replacement versus open surgical aortic valve replacement will in part depend on his coronary status (see discussion below).   2. Coronary artery disease. The patient is stable without anginal symptoms. He has severe diffuse stenosis of the right coronary artery and history of previous inferior wall infarction. He underwent LAD intervention earlier this year and he would require repeat cardiac catheterization if we were to pursue treatment for his aortic stenosis. He remains on antiplatelet therapy with aspirin.  For followup, we'll see him back in 3 months with a repeat echocardiogram. He understands that if he were to develop chest pain, shortness of breath, or  syncope, he should seek immediate medical attention.  In excess of 45 minutes was spent in review of data including cath and echo images, chart review, and direct patient care and discussion compromised greater than 50% of this time.  Tonny Bollman 06/27/2012 3:37 PM

## 2012-06-28 NOTE — Addendum Note (Signed)
Encounter addended by: Purcell Nails, MD on: 06/28/2012  7:44 AM<BR>     Documentation filed: Charting, Inpatient Notes

## 2012-07-02 ENCOUNTER — Other Ambulatory Visit: Payer: Self-pay

## 2012-07-02 DIAGNOSIS — I359 Nonrheumatic aortic valve disorder, unspecified: Secondary | ICD-10-CM

## 2012-07-03 ENCOUNTER — Encounter: Payer: Self-pay | Admitting: Internal Medicine

## 2012-07-03 ENCOUNTER — Ambulatory Visit (INDEPENDENT_AMBULATORY_CARE_PROVIDER_SITE_OTHER): Payer: Medicare Other | Admitting: Internal Medicine

## 2012-07-03 VITALS — BP 139/79 | HR 60 | Temp 97.9°F | Resp 20 | Ht 65.0 in | Wt 213.1 lb

## 2012-07-03 DIAGNOSIS — R0602 Shortness of breath: Secondary | ICD-10-CM

## 2012-07-03 DIAGNOSIS — Z23 Encounter for immunization: Secondary | ICD-10-CM

## 2012-07-03 DIAGNOSIS — R42 Dizziness and giddiness: Secondary | ICD-10-CM

## 2012-07-03 DIAGNOSIS — I10 Essential (primary) hypertension: Secondary | ICD-10-CM

## 2012-07-03 DIAGNOSIS — K922 Gastrointestinal hemorrhage, unspecified: Secondary | ICD-10-CM

## 2012-07-03 DIAGNOSIS — Z0000 Encounter for general adult medical examination without abnormal findings: Secondary | ICD-10-CM

## 2012-07-03 MED ORDER — LOSARTAN POTASSIUM 50 MG PO TABS: 50.0000 mg | ORAL_TABLET | Freq: Every day | ORAL | Status: DC

## 2012-07-03 MED ORDER — ZOSTER VACCINE LIVE 19400 UNT/0.65ML ~~LOC~~ SOLR: 0.6500 mL | Freq: Once | SUBCUTANEOUS | Status: DC

## 2012-07-03 NOTE — Progress Notes (Signed)
INTERNAL MEDICINE TEACHING ATTENDING ADDENDUM Lonzo Cloud , MD: I personally saw and evaluated Mr.Swingler in this clinic visit in conjunction with the resident, Dr. Algernon Huxley. I have discussed the patient's plan of care with Dr. Algernon Huxley during this visit. I have confirmed the physical exam findings and have read and agree with the clinic note including the plan.

## 2012-07-03 NOTE — Progress Notes (Signed)
Patient ID: Eugene [name], male   DOB: February 04, 1924, 78 y.o.   MRN: [id number]  Subjective:   Patient ID: Eugene [name] male   DOB: January 25, 1924 78 y.o.   MRN: [id number]  HPI: Eugene [name] is a 78 y.o. male w/ PMH AS with recent valvuloplasty, glaucoma, HLD, CAD, and diverticulosis/AVMs with numerous admissions in the past for GI bleeds who presents for a general monthly check-up.  He was seen by his Cardiologist on 9/20 and was told that he is doing well. He has chronic SOB and has been experiencing mild dizziness. He is checking his blood pressure twice a week at home, and his readings have all been normal. He does weigh himself daily and endorses recent weight gain, which he attributes to too much cake and celebration from his recent birthday.  He doe have a h/o GI blood and has diverticulosis. He states that his diet has not been the best lately, and he has been eating nuts. He did have one episode of blood in his stool last weekend which has not reoccurred.    Past Medical History  Diagnosis Date  . Glaucoma     Right eye  . Diverticulosis     multiple hospital admissions for GI bleed, has an episode roughly every 6 months,, last  colonscopy in 2006 showed diverticulosis, was evaluated by Dr. Madilyn Fireman in the hospital in 2011 for lower GI bleed andd it  was suggested that the patient was not actively bleeding at that time and given his comorbid conditions colonoscopy was deferred. He is also chronically constipated  . Macular degeneration of right eye   . Allergic rhinitis   . CAD (coronary artery disease)     s/p PCI in 1991;  Cardiac catheterization 11/02/11: Ostial LAD 95% with aneurysmal dilatation after this, proximal circumflex 70% with a superior branch 30-40%, proximal RCA 75% and 90%, mid RCA 50% and 90% before a PDA, then occluded.;    high risk for CABG;PCI 11/14/11: Bare-metal stent to the proximal LAD    . OSA on CPAP   . Hypertension   . Hyperlipidemia   . Chronic systolic  heart failure     Echocardiogram 11/02/11: Moderate LVH, EF 30-35%, multiple wall motion abnormalities, critical aortic stenosis, AVA 0.6, mean gradient 45, mild MR, severe LAE  . Supraventricular tachycardia     2 syndromes-nonsustained atrial tachycardia//adenosine responsive diuretic positive reentry probably AV node reentry  . Aortic stenosis     s/p AV balloon valvuloplasty by Dr. Excell Seltzer 11/2011  . Ischemic cardiomyopathy   . H/O: GI bleed     recurrent  . Heart murmur   . CHF (congestive heart failure)    Current Outpatient Prescriptions  Medication Sig Dispense Refill  . amiodarone (PACERONE) 200 MG tablet Take 200 mg by mouth daily.      Marland Kitchen amLODipine (NORVASC) 2.5 MG tablet Take 1 tablet (2.5 mg total) by mouth daily.  30 tablet  11  . aspirin 81 MG tablet Take 81 mg by mouth daily.        . Calcium Carbonate-Vitamin D (CALCIUM + D PO) Take by mouth daily.      . carvedilol (COREG) 6.25 MG tablet Take 1 tablet (6.25 mg total) by mouth 2 (two) times daily.  60 tablet  11  . docusate sodium (COLACE) 100 MG capsule Take 100 mg by mouth 2 (two) times daily.       . ferrous sulfate 325 (65 FE) MG tablet Take 325  mg by mouth daily with breakfast.      . furosemide (LASIX) 40 MG tablet Take 40 mg by mouth daily.      Marland Kitchen levothyroxine (LEVOTHROID) 25 MCG tablet Take 1 tablet (25 mcg total) by mouth daily.  30 tablet  11  . losartan (COZAAR) 50 MG tablet Take 1 tablet (50 mg total) by mouth daily.  30 tablet  6  . loteprednol (LOTEMAX) 0.5 % ophthalmic suspension Place 1 drop into the right eye every morning.       . pantoprazole (PROTONIX) 40 MG tablet TAKE 1 TABLET BY MOUTH EVERY DAY  30 tablet  3  . pravastatin (PRAVACHOL) 40 MG tablet Take 1 tablet (40 mg total) by mouth every evening.  30 tablet  11  . spironolactone (ALDACTONE) 25 MG tablet Take 12.5 mg by mouth every morning.      . travoprost, benzalkonium, (TRAVATAN) 0.004 % ophthalmic solution Place 1 drop into the right eye at  bedtime.        . magnesium hydroxide (MILK OF MAGNESIA) 400 MG/5ML suspension Take 30 mLs by mouth daily as needed. For constipation      . zoster vaccine live, PF, (ZOSTAVAX) 16109 UNT/0.65ML injection Inject 19,400 Units into the skin once.  1 each  0   No family history on file. History   Social History  . Marital Status: Widowed    Spouse Name: N/A    Number of Children: N/A  . Years of Education: N/A   Social History Main Topics  . Smoking status: Former Smoker    Types: Cigarettes    Quit date: 10/08/1960  . Smokeless tobacco: Never Used  . Alcohol Use: No  . Drug Use: No  . Sexually Active: No   Other Topics Concern  . None   Social History Narrative   Lives at the [facility] nursing [facility]. He has a daughter in [place] and a health-care power of attorney. He also has a close friend who takes him for his doctor's appointments and is with him during the hospital admission. 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 Never smoked or drank alcohol in past 40 years. No illicit drug use. Has Medicare.   Review of Systems: Constitutional: Denies fever, chills, diaphoresis, appetite change and fatigue.  HEENT: Endorses redness of right eye, and hearing loss. Denies photophobia, eye pain, congestion, sore throat, rhinorrhea, sneezing, mouth sores, trouble swallowing, neck pain, or neck stiffness.   Respiratory: Endorses chronic SOB. Denies chest tightness or wheezing.   Cardiovascular: Endorses BLE swelling. Denies chest pain.  Gastrointestinal: Endorses blood in his stool x1. Denies nausea, vomiting, abdominal pain, diarrhea, constipation, hemorrhoids, or abdominal distention.  Genitourinary: Denies dysuria, urgency, frequency, hematuria, flank pain and difficulty urinating.  Musculoskeletal: Endorses that he is unsteady on his feet and has to use a walker.  Neurological: Endorses occasional dizziness. Denies seizures, numbness and  headaches.  Hematological: Denies adenopathy.  Psychiatric/Behavioral: Denies mood changes or agitation.  Objective:  Physical Exam: Filed Vitals:   07/03/12 1411  BP: 139/79  Pulse: 60  Temp: 97.9 F (36.6 C)  TempSrc: Oral  Resp: 20  Height: 5\' 5"  (1.651 m)  Weight: 213 lb 1.6 oz (96.662 kg)  SpO2: 95%   Constitutional: Vital signs reviewed.  Patient is a well-developed and well-nourished male in no acute distress and cooperative with exam. Alert and oriented x3.  Head: Normocephalic and atraumatic Mouth: Multiple gold fillings Eyes: PERRL,  EOMI, no scleral icterus. Injected right conjunctiva.   Neck: Supple, Trachea midline normal ROM, No JVD, mass, or thyromegaly.  Cardiovascular: RRR, no MRG, pulses symmetric and intact bilaterally,  1+ pitting edema BLE. Pulmonary/Chest: CTAB, no wheezes, rales, or rhonchi Abdominal: Soft. Non-tender, non-distended. Musculoskeletal: No joint deformities or erythema Hematology: No cervical adenopathy.  Neurological: A&O x3, Strength is normal and symmetric bilaterally, cranial nerve II-XII are grossly intact, no focal motor deficit, sensory intact to light touch bilaterally.  Skin: Warm, dry and intact. No rash, cyanosis, or clubbing.  Psychiatric: Normal mood and affect. Speech and behavior appear normal.   Assessment & Plan:  Please refer to Problem List based A&P.

## 2012-07-03 NOTE — Patient Instructions (Addendum)
Continue to take all of your medications as prescribed.  You will follow up with Dr. Lonzo Cloud in about 3 months.

## 2012-07-06 NOTE — Assessment & Plan Note (Signed)
One episode of blood in his stool in the weekend prior to his clinic visit. No further episodes. He endorses eating nuts recently, which he knows he is not supposed to do 2/2 diverticulosis. He knows to call the clinic or go to the ED if he continues to experience bleeding.

## 2012-07-06 NOTE — Assessment & Plan Note (Signed)
BP 139/79. Pt taking all medications as prescribed. - Continue current medication regimen.

## 2012-07-06 NOTE — Assessment & Plan Note (Addendum)
Chronic for the pt, and is unchanged. Pt recently has PFTs due to his aortic stenosis, results are pending.

## 2012-07-06 NOTE — Assessment & Plan Note (Signed)
Improved per pt. Mild dizziness a few days prior to his clinic visit, but sx less frequent than previously.

## 2012-08-29 NOTE — Telephone Encounter (Signed)
This was completed - faxed and scanned into the system

## 2012-09-18 ENCOUNTER — Other Ambulatory Visit: Payer: Self-pay | Admitting: Internal Medicine

## 2012-09-23 ENCOUNTER — Ambulatory Visit (HOSPITAL_COMMUNITY): Payer: Medicare Other | Attending: Cardiovascular Disease | Admitting: Radiology

## 2012-09-23 ENCOUNTER — Encounter: Payer: Self-pay | Admitting: Cardiovascular Disease

## 2012-09-23 ENCOUNTER — Ambulatory Visit (INDEPENDENT_AMBULATORY_CARE_PROVIDER_SITE_OTHER): Payer: Medicare Other | Admitting: Cardiovascular Disease

## 2012-09-23 VITALS — BP 152/86 | HR 58 | Ht 65.0 in | Wt 213.0 lb

## 2012-09-23 DIAGNOSIS — I359 Nonrheumatic aortic valve disorder, unspecified: Secondary | ICD-10-CM | POA: Insufficient documentation

## 2012-09-23 DIAGNOSIS — R011 Cardiac murmur, unspecified: Secondary | ICD-10-CM | POA: Insufficient documentation

## 2012-09-23 DIAGNOSIS — I509 Heart failure, unspecified: Secondary | ICD-10-CM | POA: Insufficient documentation

## 2012-09-23 DIAGNOSIS — I4891 Unspecified atrial fibrillation: Secondary | ICD-10-CM

## 2012-09-23 DIAGNOSIS — I252 Old myocardial infarction: Secondary | ICD-10-CM | POA: Insufficient documentation

## 2012-09-23 DIAGNOSIS — Z79899 Other long term (current) drug therapy: Secondary | ICD-10-CM

## 2012-09-23 DIAGNOSIS — E785 Hyperlipidemia, unspecified: Secondary | ICD-10-CM | POA: Insufficient documentation

## 2012-09-23 DIAGNOSIS — I517 Cardiomegaly: Secondary | ICD-10-CM | POA: Insufficient documentation

## 2012-09-23 DIAGNOSIS — I10 Essential (primary) hypertension: Secondary | ICD-10-CM | POA: Insufficient documentation

## 2012-09-23 DIAGNOSIS — I2510 Atherosclerotic heart disease of native coronary artery without angina pectoris: Secondary | ICD-10-CM | POA: Insufficient documentation

## 2012-09-23 NOTE — Progress Notes (Signed)
Echocardiogram performed.  

## 2012-09-23 NOTE — Progress Notes (Signed)
HPI:  78 year old gentleman presenting for followup of severe aortic stenosis. He presented in January of this year with syncope. He ultimately underwent balloon aortic valvuloplasty and stenting of the proximal LAD. He was seen in the multidisciplinary heart valve clinic in September and he elected to continue with medical therapy and routine followup at that time.  He complains of lightheadedness when he first stands up. He has not had frank syncope, but he has fallen. He complains of gait unsteadiness. He also complains of dyspnea with exertion. He is dyspneic with walking to his mailbox to get his newspaper and back. He tells me it takes him about 5 minutes to recover from this. He denies chest pain or pressure. He has no other complaints. He remains independent at home.  Outpatient Encounter Prescriptions as of 09/23/2012  Medication Sig Dispense Refill  . amiodarone (PACERONE) 200 MG tablet Take 200 mg by mouth daily.      Marland Kitchen amLODipine (NORVASC) 2.5 MG tablet Take 1 tablet (2.5 mg total) by mouth daily.  30 tablet  11  . aspirin 81 MG tablet Take 81 mg by mouth daily.        . Calcium Carbonate-Vitamin D (CALCIUM + D PO) Take by mouth daily.      . carvedilol (COREG) 6.25 MG tablet Take 1 tablet (6.25 mg total) by mouth 2 (two) times daily.  60 tablet  11  . docusate sodium (COLACE) 100 MG capsule Take 100 mg by mouth 2 (two) times daily.       . ferrous sulfate 325 (65 FE) MG tablet Take 325 mg by mouth daily with breakfast.      . furosemide (LASIX) 40 MG tablet Take 40 mg by mouth daily.      Marland Kitchen levothyroxine (LEVOTHROID) 25 MCG tablet Take 1 tablet (25 mcg total) by mouth daily.  30 tablet  11  . losartan (COZAAR) 50 MG tablet TAKE 1 TABLET (50 MG TOTAL) BY MOUTH DAILY.  30 tablet  4  . loteprednol (LOTEMAX) 0.5 % ophthalmic suspension Place 1 drop into the right eye every morning.       . magnesium hydroxide (MILK OF MAGNESIA) 400 MG/5ML suspension Take 30 mLs by mouth daily as needed.  For constipation      . pantoprazole (PROTONIX) 40 MG tablet TAKE 1 TABLET BY MOUTH EVERY DAY  30 tablet  3  . pravastatin (PRAVACHOL) 40 MG tablet Take 1 tablet (40 mg total) by mouth every evening.  30 tablet  11  . spironolactone (ALDACTONE) 25 MG tablet Take 12.5 mg by mouth every morning.      . travoprost, benzalkonium, (TRAVATAN) 0.004 % ophthalmic solution Place 1 drop into the right eye at bedtime.        . [DISCONTINUED] losartan (COZAAR) 50 MG tablet Take 1 tablet (50 mg total) by mouth daily.  30 tablet  6  . [DISCONTINUED] zoster vaccine live, PF, (ZOSTAVAX) 47829 UNT/0.65ML injection Inject 19,400 Units into the skin once.  1 each  0    Allergies  Allergen Reactions  . Penicillins     REACTION: "turns red"    Past Medical History  Diagnosis Date  . Glaucoma(365)     Right eye  . Diverticulosis     multiple hospital admissions for GI bleed, has an episode roughly every 6 months,, last  colonscopy in 2006 showed diverticulosis, was evaluated by Dr. Madilyn Fireman in the hospital in 2011 for lower GI bleed andd it  was suggested  that the patient was not actively bleeding at that time and given his comorbid conditions colonoscopy was deferred. He is also chronically constipated  . Macular degeneration of right eye   . Allergic rhinitis   . CAD (coronary artery disease)     s/p PCI in 1991;  Cardiac catheterization 11/02/11: Ostial LAD 95% with aneurysmal dilatation after this, proximal circumflex 70% with a superior branch 30-40%, proximal RCA 75% and 90%, mid RCA 50% and 90% before a PDA, then occluded.;    high risk for CABG;PCI 11/14/11: Bare-metal stent to the proximal LAD    . OSA on CPAP   . Hypertension   . Hyperlipidemia   . Chronic systolic heart failure     Echocardiogram 11/02/11: Moderate LVH, EF 30-35%, multiple wall motion abnormalities, critical aortic stenosis, AVA 0.6, mean gradient 45, mild MR, severe LAE  . Supraventricular tachycardia     2 syndromes-nonsustained  atrial tachycardia//adenosine responsive diuretic positive reentry probably AV node reentry  . Aortic stenosis     s/p AV balloon valvuloplasty by Dr. Excell Seltzer 11/2011  . Ischemic cardiomyopathy   . H/O: GI bleed     recurrent  . Heart murmur   . CHF (congestive heart failure)    ROS: Negative except as per HPI  BP 152/86  Pulse 58  Ht 5\' 5"  (1.651 m)  Wt 96.616 kg (213 lb)  BMI 35.44 kg/m2  SpO2 97%  PHYSICAL EXAM: Pt is alert and oriented, pleasant elderly male in NAD HEENT: normal Neck: JVP - normal, carotids 2+= without bruits Lungs: CTA bilaterally CV: RRR with grade 3/6 harsh systolic murmur at the right upper sternal border. There is no diastolic murmur. Abd: soft, NT, Positive BS, no hepatomegaly Ext: 1+ ankle edema bilaterally, distal pulses intact and equal Skin: warm/dry no rash  Echo: Official interpretation pending. I have reviewed the patient's echo images. He has a severely calcified, restricted aortic valve. His LV outflow tract diameter is 2.0 cm. The LVOT velocity is 61 cm/s and a peak aortic valve velocity is 411 cm/s. The mean transaortic valvular gradient is 43 mm mercury and a peak gradient of 76 mmHg. The patient's LV systolic function appears preserved and he has moderate LVH.  ASSESSMENT AND PLAN: 1. Severe aortic stenosis. I have personally reviewed the patient's echo images. He clearly has severe aortic stenosis by echo criteria and he has symptoms attributable to this. I have reviewed potential treatment options with him once again. At this point he prefers to continue with observation and close followup. He really does not want to pursue TAVR unless he becomes more symptomatic. He tells me he is satisfied with his functional capacity and current quality of life. He understands that his symptoms will likely progress and that we may not be able to offer him treatment if he declined substantially. I will see him back in 3 months for followup.  2. Paroxysmal  atrial fibrillation. The patient remains on amiodarone. I have reviewed his lab work it has been over 6 months since his liver function and thyroid function testing has been done. Will repeat his labs today.  3. Coronary artery disease, native vessel. He remains on aspirin for antiplatelet therapy. He is having no anginal symptoms.  Tonny Bollman 09/23/2012 4:00 PM

## 2012-09-23 NOTE — Patient Instructions (Addendum)
The current medical regimen is effective;  continue present plan and medications.  Please have blood work today.  Follow up in 4 months with Dr Excell Seltzer.  You will receive a letter in the mail 2 months before you are due.  Please call us when you receive this letter to schedule your follow up appointment.

## 2012-09-24 LAB — HEPATIC FUNCTION PANEL
AST: 17 U/L (ref 0–37)
Alkaline Phosphatase: 74 U/L (ref 39–117)
Bilirubin, Direct: 0.1 mg/dL (ref 0.0–0.3)
Total Bilirubin: 0.8 mg/dL (ref 0.3–1.2)

## 2012-09-24 LAB — TSH: TSH: 1.57 u[IU]/mL (ref 0.35–5.50)

## 2012-10-01 ENCOUNTER — Emergency Department (HOSPITAL_COMMUNITY): Payer: Medicare Other

## 2012-10-01 ENCOUNTER — Emergency Department (HOSPITAL_COMMUNITY)
Admission: EM | Admit: 2012-10-01 | Discharge: 2012-10-01 | Disposition: A | Discharge status: Home / Self Care | Payer: Medicare Other | Attending: Emergency Medicine | Admitting: Emergency Medicine

## 2012-10-01 ENCOUNTER — Encounter (HOSPITAL_COMMUNITY): Payer: Self-pay

## 2012-10-01 DIAGNOSIS — Z7982 Long term (current) use of aspirin: Secondary | ICD-10-CM | POA: Insufficient documentation

## 2012-10-01 DIAGNOSIS — E860 Dehydration: Secondary | ICD-10-CM | POA: Insufficient documentation

## 2012-10-01 DIAGNOSIS — Y9301 Activity, walking, marching and hiking: Secondary | ICD-10-CM | POA: Insufficient documentation

## 2012-10-01 DIAGNOSIS — S0083XA Contusion of other part of head, initial encounter: Secondary | ICD-10-CM

## 2012-10-01 DIAGNOSIS — Z8719 Personal history of other diseases of the digestive system: Secondary | ICD-10-CM | POA: Insufficient documentation

## 2012-10-01 DIAGNOSIS — E785 Hyperlipidemia, unspecified: Secondary | ICD-10-CM | POA: Insufficient documentation

## 2012-10-01 DIAGNOSIS — Z8669 Personal history of other diseases of the nervous system and sense organs: Secondary | ICD-10-CM | POA: Insufficient documentation

## 2012-10-01 DIAGNOSIS — Z8709 Personal history of other diseases of the respiratory system: Secondary | ICD-10-CM | POA: Insufficient documentation

## 2012-10-01 DIAGNOSIS — R21 Rash and other nonspecific skin eruption: Secondary | ICD-10-CM | POA: Insufficient documentation

## 2012-10-01 DIAGNOSIS — H409 Unspecified glaucoma: Secondary | ICD-10-CM | POA: Insufficient documentation

## 2012-10-01 DIAGNOSIS — R55 Syncope and collapse: Secondary | ICD-10-CM | POA: Insufficient documentation

## 2012-10-01 DIAGNOSIS — S0003XA Contusion of scalp, initial encounter: Secondary | ICD-10-CM | POA: Insufficient documentation

## 2012-10-01 DIAGNOSIS — R296 Repeated falls: Secondary | ICD-10-CM | POA: Insufficient documentation

## 2012-10-01 DIAGNOSIS — Y9289 Other specified places as the place of occurrence of the external cause: Secondary | ICD-10-CM | POA: Insufficient documentation

## 2012-10-01 DIAGNOSIS — I2510 Atherosclerotic heart disease of native coronary artery without angina pectoris: Secondary | ICD-10-CM | POA: Insufficient documentation

## 2012-10-01 DIAGNOSIS — Z79899 Other long term (current) drug therapy: Secondary | ICD-10-CM | POA: Insufficient documentation

## 2012-10-01 DIAGNOSIS — R011 Cardiac murmur, unspecified: Secondary | ICD-10-CM | POA: Insufficient documentation

## 2012-10-01 DIAGNOSIS — I5022 Chronic systolic (congestive) heart failure: Secondary | ICD-10-CM | POA: Insufficient documentation

## 2012-10-01 DIAGNOSIS — Z8679 Personal history of other diseases of the circulatory system: Secondary | ICD-10-CM | POA: Insufficient documentation

## 2012-10-01 DIAGNOSIS — G4733 Obstructive sleep apnea (adult) (pediatric): Secondary | ICD-10-CM | POA: Insufficient documentation

## 2012-10-01 DIAGNOSIS — I10 Essential (primary) hypertension: Secondary | ICD-10-CM | POA: Insufficient documentation

## 2012-10-01 DIAGNOSIS — Z87891 Personal history of nicotine dependence: Secondary | ICD-10-CM | POA: Insufficient documentation

## 2012-10-01 LAB — CBC WITH DIFFERENTIAL/PLATELET
Basophils Absolute: 0 10*3/uL (ref 0.0–0.1)
Basophils Relative: 1 % (ref 0–1)
Eosinophils Relative: 5 % (ref 0–5)
HCT: 45.6 % (ref 39.0–52.0)
MCH: 30.3 pg (ref 26.0–34.0)
MCHC: 32.9 g/dL (ref 30.0–36.0)
MCV: 92.1 fL (ref 78.0–100.0)
Monocytes Absolute: 0.7 10*3/uL (ref 0.1–1.0)
RDW: 13 % (ref 11.5–15.5)

## 2012-10-01 LAB — PROTIME-INR: Prothrombin Time: 12.6 seconds (ref 11.6–15.2)

## 2012-10-01 LAB — COMPREHENSIVE METABOLIC PANEL
AST: 13 U/L (ref 0–37)
Albumin: 3.5 g/dL (ref 3.5–5.2)
Calcium: 9.5 mg/dL (ref 8.4–10.5)
Creatinine, Ser: 1.19 mg/dL (ref 0.50–1.35)
GFR calc non Af Amer: 53 mL/min — ABNORMAL LOW (ref >90)

## 2012-10-01 LAB — TROPONIN I: Troponin I: <0.3 ng/mL (ref <0.30)

## 2012-10-01 LAB — PRO B NATRIURETIC PEPTIDE: Pro B Natriuretic peptide (BNP): 1990 pg/mL — ABNORMAL HIGH (ref 0–450)

## 2012-10-01 MED ORDER — SODIUM CHLORIDE 0.9 % IV BOLUS (SEPSIS): 500.0000 mL | Freq: Once | INTRAVENOUS | Status: DC

## 2012-10-01 NOTE — ED Notes (Signed)
Pt d/c'd from monitor, continuous pulse oximetry and blood pressure cuff; pt getting dressed to be discharged home 

## 2012-10-01 NOTE — ED Notes (Signed)
Pt has returned from being out of the department; pt placed back on monitor,continuous pulse oximetry and blood pressure cuff; family at bedside; repeat EKG performed per MD

## 2012-10-01 NOTE — ED Notes (Signed)
Pt still out of the department for procedures; family at bedside

## 2012-10-01 NOTE — ED Notes (Signed)
Pt undressed, in gown, on monitor, continuous pulse oximetry and blood pressure cuff; EKG performed; family at bedside 

## 2012-10-01 NOTE — ED Notes (Signed)
Patient transported to CT 

## 2012-10-01 NOTE — ED Notes (Signed)
Pt walking in church parking lot at midnight and "lost use of my body and fell"

## 2012-10-01 NOTE — ED Provider Notes (Signed)
History     CSN: [id number]  Arrival date & time 10/01/12  0800   First MD Initiated Contact with Patient 10/01/12 [phone]      Chief Complaint  Patient presents with  . Fall    (Consider location/radiation/quality/duration/timing/severity/associated sxs/prior treatment) The history is provided by the patient.  Eugene [name] is a 78 y.o. male hx of CHF, HTN, aortic stenosis here with syncope. He was walking to the car yesterday and felt lightheaded and passed out. Hit his head and had an abrasion on L side of his face. Tetanus up to date. No fevers or chills. No chest pain or SOB. + chronic leg swelling not getting worse and weight was stable. Brought in today for eval. Patient not on anticoagulants.    Past Medical History  Diagnosis Date  . Glaucoma(365)     Right eye  . Diverticulosis     multiple hospital admissions for GI bleed, has an episode roughly every 6 months,, last  colonscopy in 2006 showed diverticulosis, was evaluated by Dr. Madilyn Fireman in the hospital in 2011 for lower GI bleed andd it  was suggested that the patient was not actively bleeding at that time and given his comorbid conditions colonoscopy was deferred. He is also chronically constipated  . Macular degeneration of right eye   . Allergic rhinitis   . CAD (coronary artery disease)     s/p PCI in 1991;  Cardiac catheterization 11/02/11: Ostial LAD 95% with aneurysmal dilatation after this, proximal circumflex 70% with a superior branch 30-40%, proximal RCA 75% and 90%, mid RCA 50% and 90% before a PDA, then occluded.;    high risk for CABG;PCI 11/14/11: Bare-metal stent to the proximal LAD    . OSA on CPAP   . Hypertension   . Hyperlipidemia   . Chronic systolic heart failure     Echocardiogram 11/02/11: Moderate LVH, EF 30-35%, multiple wall motion abnormalities, critical aortic stenosis, AVA 0.6, mean gradient 45, mild MR, severe LAE  . Supraventricular tachycardia     2 syndromes-nonsustained atrial  tachycardia//adenosine responsive diuretic positive reentry probably AV node reentry  . Aortic stenosis     s/p AV balloon valvuloplasty by Dr. Excell Seltzer 11/2011  . Ischemic cardiomyopathy   . H/O: GI bleed     recurrent  . Heart murmur   . CHF (congestive heart failure)     Past Surgical History  Procedure Date  . Implantation of baerveldt glaucoma device lant with scleral reinforcement using tutoplast tissue graft right eye.   . Coronary angioplasty with stent placement   . Corneal transplant     History reviewed. No pertinent family history.  History  Substance Use Topics  . Smoking status: Former Smoker    Types: Cigarettes    Quit date: 10/08/1960  . Smokeless tobacco: Never Used  . Alcohol Use: No      Review of Systems  Skin: Positive for rash.  Neurological: Positive for syncope.  All other systems reviewed and are negative.    Allergies  Penicillins  Home Medications   Current Outpatient Rx  Name  Route  Sig  Dispense  Refill  . AMIODARONE HCL 200 MG PO TABS   Oral   Take 200 mg by mouth daily.         Marland Kitchen AMLODIPINE BESYLATE 2.5 MG PO TABS   Oral   Take 1 tablet (2.5 mg total) by mouth daily.   30 tablet   11   . ASPIRIN 81 MG PO  TABS   Oral   Take 81 mg by mouth daily.           Marland Kitchen CALCIUM + D PO   Oral   Take 1 tablet by mouth daily.          Marland Kitchen CARVEDILOL 6.25 MG PO TABS   Oral   Take 1 tablet (6.25 mg total) by mouth 2 (two) times daily.   60 tablet   11   . DOCUSATE SODIUM 100 MG PO CAPS   Oral   Take 100 mg by mouth 2 (two) times daily.          Marland Kitchen FERROUS SULFATE 325 (65 FE) MG PO TABS   Oral   Take 325 mg by mouth daily with breakfast.         . FUROSEMIDE 40 MG PO TABS   Oral   Take 40 mg by mouth daily.         Marland Kitchen LEVOTHYROXINE SODIUM 25 MCG PO TABS   Oral   Take 1 tablet (25 mcg total) by mouth daily.   30 tablet   11   . LOSARTAN POTASSIUM 50 MG PO TABS   Oral   Take 50 mg by mouth daily.         Marland Kitchen  LOTEPREDNOL ETABONATE 0.5 % OP SUSP   Right Eye   Place 1 drop into the right eye every morning.          Marland Kitchen MAGNESIUM HYDROXIDE 400 MG/5ML PO SUSP   Oral   Take 30 mLs by mouth daily as needed. For constipation         . PANTOPRAZOLE SODIUM 40 MG PO TBEC   Oral   Take 40 mg by mouth daily.         Marland Kitchen PRAVASTATIN SODIUM 40 MG PO TABS   Oral   Take 1 tablet (40 mg total) by mouth every evening.   30 tablet   11   . SPIRONOLACTONE 25 MG PO TABS   Oral   Take 12.5 mg by mouth every morning.         . TRAVOPROST 0.004 % OP SOLN   Right Eye   Place 1 drop into the right eye at bedtime.             BP 161/81  Pulse 54  Temp 98.8 F (37.1 C) (Oral)  Resp 17  SpO2 92%  Physical Exam  Nursing note and vitals reviewed. Constitutional: He is oriented to person, place, and time. He appears well-developed.       Calm, NAD   HENT:  Head: Normocephalic.  Mouth/Throat: Oropharynx is clear and moist.       Abrasion on L face. Bruise on L cheek with minimal tenderness.   Eyes: Conjunctivae normal are normal. Pupils are equal, round, and reactive to light.  Neck: Normal range of motion. Neck supple.       No midline tenderness   Cardiovascular: Normal rate, regular rhythm and normal heart sounds.   Pulmonary/Chest: Effort normal.       Minimal crackles bilateral bases. No JVD.   Abdominal: Soft. Bowel sounds are normal. He exhibits no distension. There is no tenderness. There is no rebound and no guarding.  Musculoskeletal: Normal range of motion.       2+ edema (chronic per patient)   Neurological: He is alert and oriented to person, place, and time.  Skin: Skin is warm and dry.  Psychiatric: He has a  normal mood and affect. His behavior is normal. Judgment and thought content normal.    ED Course  Procedures (including critical care time)  Labs Reviewed  COMPREHENSIVE METABOLIC PANEL - Abnormal; Notable for the following:    Glucose, Bld 100 (*)     BUN 25 (*)      GFR calc non Af Amer 53 (*)     GFR calc Af Amer 62 (*)     All other components within normal limits  PRO B NATRIURETIC PEPTIDE - Abnormal; Notable for the following:    Pro B Natriuretic peptide (BNP) 1990.0 (*)     All other components within normal limits  CBC WITH DIFFERENTIAL  TROPONIN I  PROTIME-INR   Dg Chest 2 View  10/01/2012  *RADIOLOGY REPORT*  Clinical Data: Shortness of breath, cough  CHEST - 2 VIEW  Comparison: 12/12/2011  Findings: Cardiac leads overlie the chest and obscure detail. Incomplete healing of previously seen comminuted right proximal humeral fracture is noted.  Curvilinear left lower lobe scarring or atelectasis is stable.  No new pulmonary opacity allowing for hypoaeration.  IMPRESSION: Left lower lobe presumed scarring, or possibly atelectasis.   Original Report Authenticated By: Christiana Pellant, M.D.    Ct Head Wo Contrast  10/01/2012  *RADIOLOGY REPORT*  Clinical Data:  Larey Seat last night injuring the left side of face and head  CT HEAD WITHOUT CONTRAST CT MAXILLOFACIAL WITHOUT CONTRAST CT CERVICAL SPINE WITHOUT CONTRAST  Technique:  Multidetector CT imaging of the head, cervical spine, and maxillofacial structures were performed using the standard protocol without intravenous contrast. Multiplanar CT image reconstructions of the cervical spine and maxillofacial structures were also generated.  Comparison:  CT brain scan of 10/31/2011  CT HEAD  Findings: There is ectasia of the basilar artery with calcification.  Ventricular system is slightly prominent, as are the cortical sulci, indicative of diffuse atrophy.  Mild small vessel ischemic change is noted throughout the periventricular white matter.  No hemorrhage, mass lesion, or acute infarction is seen.  On the bone window images, no calvarial abnormality is seen.  IMPRESSION: Atrophy and small vessel disease.  No acute intracranial abnormality.  CT MAXILLOFACIAL  Findings:  No mandibular fracture is seen.  The  zygomatic arches are intact as are the orbital rims.  A curvilinear opacity overlies the right colon from prior surgery.  The paranasal sinuses are clear.  The nasal bone appears intact.  The mandibular condyles are in normal position.  IMPRESSION: No maxillofacial fracture is seen.  CT CERVICAL SPINE  Findings:   The cervical vertebrae are straightened in alignment. There is diffuse degenerative change with loss of disc spaces C4-5, C5-6, and C6-7 levels with considerable anterior osteophyte formation.  Degenerative changes also are noted diffusely throughout the facet joints of the entire cervical spine.  However, no acute fracture is seen.  The odontoid process is intact.  No soft tissue abnormality is seen.  The thyroid gland is unremarkable.  IMPRESSION: Diffuse degenerative change throughout the cervical spine with diffuse osteopenia.  No acute fracture.   Original Report Authenticated By: Dwyane Dee, M.D.    Ct Cervical Spine Wo Contrast  10/01/2012  *RADIOLOGY REPORT*  Clinical Data:  Larey Seat last night injuring the left side of face and head  CT HEAD WITHOUT CONTRAST CT MAXILLOFACIAL WITHOUT CONTRAST CT CERVICAL SPINE WITHOUT CONTRAST  Technique:  Multidetector CT imaging of the head, cervical spine, and maxillofacial structures were performed using the standard protocol without intravenous contrast. Multiplanar CT image  reconstructions of the cervical spine and maxillofacial structures were also generated.  Comparison:  CT brain scan of 10/31/2011  CT HEAD  Findings: There is ectasia of the basilar artery with calcification.  Ventricular system is slightly prominent, as are the cortical sulci, indicative of diffuse atrophy.  Mild small vessel ischemic change is noted throughout the periventricular white matter.  No hemorrhage, mass lesion, or acute infarction is seen.  On the bone window images, no calvarial abnormality is seen.  IMPRESSION: Atrophy and small vessel disease.  No acute intracranial  abnormality.  CT MAXILLOFACIAL  Findings:  No mandibular fracture is seen.  The zygomatic arches are intact as are the orbital rims.  A curvilinear opacity overlies the right colon from prior surgery.  The paranasal sinuses are clear.  The nasal bone appears intact.  The mandibular condyles are in normal position.  IMPRESSION: No maxillofacial fracture is seen.  CT CERVICAL SPINE  Findings:   The cervical vertebrae are straightened in alignment. There is diffuse degenerative change with loss of disc spaces C4-5, C5-6, and C6-7 levels with considerable anterior osteophyte formation.  Degenerative changes also are noted diffusely throughout the facet joints of the entire cervical spine.  However, no acute fracture is seen.  The odontoid process is intact.  No soft tissue abnormality is seen.  The thyroid gland is unremarkable.  IMPRESSION: Diffuse degenerative change throughout the cervical spine with diffuse osteopenia.  No acute fracture.   Original Report Authenticated By: Dwyane Dee, M.D.    Ct Maxillofacial Wo Cm  10/01/2012  *RADIOLOGY REPORT*  Clinical Data:  Larey Seat last night injuring the left side of face and head  CT HEAD WITHOUT CONTRAST CT MAXILLOFACIAL WITHOUT CONTRAST CT CERVICAL SPINE WITHOUT CONTRAST  Technique:  Multidetector CT imaging of the head, cervical spine, and maxillofacial structures were performed using the standard protocol without intravenous contrast. Multiplanar CT image reconstructions of the cervical spine and maxillofacial structures were also generated.  Comparison:  CT brain scan of 10/31/2011  CT HEAD  Findings: There is ectasia of the basilar artery with calcification.  Ventricular system is slightly prominent, as are the cortical sulci, indicative of diffuse atrophy.  Mild small vessel ischemic change is noted throughout the periventricular white matter.  No hemorrhage, mass lesion, or acute infarction is seen.  On the bone window images, no calvarial abnormality is seen.   IMPRESSION: Atrophy and small vessel disease.  No acute intracranial abnormality.  CT MAXILLOFACIAL  Findings:  No mandibular fracture is seen.  The zygomatic arches are intact as are the orbital rims.  A curvilinear opacity overlies the right colon from prior surgery.  The paranasal sinuses are clear.  The nasal bone appears intact.  The mandibular condyles are in normal position.  IMPRESSION: No maxillofacial fracture is seen.  CT CERVICAL SPINE  Findings:   The cervical vertebrae are straightened in alignment. There is diffuse degenerative change with loss of disc spaces C4-5, C5-6, and C6-7 levels with considerable anterior osteophyte formation.  Degenerative changes also are noted diffusely throughout the facet joints of the entire cervical spine.  However, no acute fracture is seen.  The odontoid process is intact.  No soft tissue abnormality is seen.  The thyroid gland is unremarkable.  IMPRESSION: Diffuse degenerative change throughout the cervical spine with diffuse osteopenia.  No acute fracture.   Original Report Authenticated By: Dwyane Dee, M.D.      No diagnosis found.   Date: 10/01/2012  Rate: 54  Rhythm: normal sinus  rhythm  QRS Axis: normal  Intervals: normal  ST/T Wave abnormalities: nonspecific ST changes  Conduction Disutrbances:nonspecific intraventricular conduction delay  Narrative Interpretation:   Old EKG Reviewed: unchanged    MDM  Eugene [name] is a 78 y.o. male here with syncope. Will get orthostatics. Will also consider worsening CHF so will get BNP and CXR. Will do CT head/face/neck to r/o bleed vs fracture.   10:55 AM Patient not orthostatic. Labs at baseline (BNP baseline). CT head/neck/face showed no fracture. I don't think he has CHF exacerbation currently. He said that he didn't eat a lot yesterday. He may be a little dehydrated yesterday. Recommend gentle hydration at home and outpatient f/u.        Richardean Canal, MD 10/01/12 1056

## 2012-10-02 ENCOUNTER — Telehealth: Payer: Self-pay | Admitting: Cardiovascular Disease

## 2012-10-02 NOTE — Telephone Encounter (Signed)
New problem:   Larey Seat coming out of church on 12/24. went to er on 12/25.  But was discharged. Patient still having black out spells.

## 2012-10-02 NOTE — Telephone Encounter (Signed)
Mr. Ane Payment, [place], for Mr. Arieh calls today for follow-up appoiintment. Pt experienced a "black out" spell on 09/30/12 at church. He went to the Ed on 12/25.  According to Mr. [name] understanding pt has had no further episodes and will accompany pt to his upcoming appointment. Appt made with Norma Fredrickson on 10/15/11 same day as Dr. Excell Seltzer is here. Mylo Red RN

## 2012-10-14 ENCOUNTER — Ambulatory Visit: Payer: Medicare Other | Admitting: Nurse Practitioner

## 2012-10-15 ENCOUNTER — Encounter: Payer: Self-pay | Admitting: Nurse Practitioner

## 2012-10-15 ENCOUNTER — Other Ambulatory Visit: Payer: Self-pay | Admitting: *Deleted

## 2012-10-15 ENCOUNTER — Ambulatory Visit (INDEPENDENT_AMBULATORY_CARE_PROVIDER_SITE_OTHER): Payer: Medicare Other | Admitting: Nurse Practitioner

## 2012-10-15 VITALS — BP 140/80 | HR 58 | Ht 67.0 in | Wt 209.1 lb

## 2012-10-15 DIAGNOSIS — R55 Syncope and collapse: Secondary | ICD-10-CM

## 2012-10-15 DIAGNOSIS — N289 Disorder of kidney and ureter, unspecified: Secondary | ICD-10-CM

## 2012-10-15 DIAGNOSIS — I359 Nonrheumatic aortic valve disorder, unspecified: Secondary | ICD-10-CM

## 2012-10-15 NOTE — Progress Notes (Signed)
Eugene [name] Date of Birth: [birth year] Medical Record #[id number]  History of Present Illness: Mr. Eugene [name] is seen today for a work in visit. He is seen for Dr. Excell Seltzer. He has severe AS and a history of atrial fib, on amiodarone. He presented last January with syncope and ultimately underwent balloon aortic valvuloplasty and stenting of the proximal LAD. He was seen in the multidisciplinary heart valve clinic in September and he elected to have medical therapy. Last seen here about 3 weeks ago. Patient preferred to continue with observation and did not to pursue TAVR.   He has had another syncopal episode right at Christmas. Went to the ED. They mentioned maybe dehydration. No mention of his known severe AS.   He comes in today. 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 He is more short winded. Had the syncope on Christmas day and has had another spell where he is not sure he passed out but did fall. No actual chest pain. Using a walker. Remains frail. His POA notes decline as well. He says he is now ready to proceed on with TAVR.   Current Outpatient Prescriptions on File Prior to Visit  Medication Sig Dispense Refill  . amiodarone (PACERONE) 200 MG tablet Take 200 mg by mouth daily.      Marland Kitchen amLODipine (NORVASC) 2.5 MG tablet Take 1 tablet (2.5 mg total) by mouth daily.  30 tablet  11  . aspirin 81 MG tablet Take 81 mg by mouth daily.        . Calcium Carbonate-Vitamin D (CALCIUM + D PO) Take 1 tablet by mouth daily.       . carvedilol (COREG) 6.25 MG tablet Take 1 tablet (6.25 mg total) by mouth 2 (two) times daily.  60 tablet  11  . docusate sodium (COLACE) 100 MG capsule Take 100 mg by mouth 2 (two) times daily.       . ferrous sulfate 325 (65 FE) MG tablet Take 325 mg by mouth daily with breakfast.      . furosemide (LASIX) 40 MG tablet Take 40 mg by mouth daily.      Marland Kitchen levothyroxine (LEVOTHROID) 25 MCG tablet Take 1 tablet (25 mcg total) by mouth daily.  30 tablet  11  .  losartan (COZAAR) 50 MG tablet Take 50 mg by mouth daily.      Marland Kitchen loteprednol (LOTEMAX) 0.5 % ophthalmic suspension Place 1 drop into the right eye every morning.       . magnesium hydroxide (MILK OF MAGNESIA) 400 MG/5ML suspension Take 30 mLs by mouth daily as needed. For constipation      . pantoprazole (PROTONIX) 40 MG tablet Take 40 mg by mouth daily.      . pravastatin (PRAVACHOL) 40 MG tablet Take 1 tablet (40 mg total) by mouth every evening.  30 tablet  11  . spironolactone (ALDACTONE) 25 MG tablet Take 12.5 mg by mouth every morning.      . travoprost, benzalkonium, (TRAVATAN) 0.004 % ophthalmic solution Place 1 drop into the right eye at bedtime.          Allergies  Allergen Reactions  . Penicillins Swelling    REACTION: "turns red"    Past Medical History  Diagnosis Date  . Glaucoma(365)     Right eye  . Diverticulosis     multiple hospital admissions for GI bleed, has an episode roughly every 6 months,, last  colonscopy in 2006 showed diverticulosis, was  evaluated by Dr. Madilyn Fireman in the hospital in 2011 for lower GI bleed andd it  was suggested that the patient was not actively bleeding at that time and given his comorbid conditions colonoscopy was deferred. He is also chronically constipated  . Macular degeneration of right eye   . Allergic rhinitis   . CAD (coronary artery disease)     s/p PCI in 1991;  Cardiac catheterization 11/02/11: Ostial LAD 95% with aneurysmal dilatation after this, proximal circumflex 70% with a superior branch 30-40%, proximal RCA 75% and 90%, mid RCA 50% and 90% before a PDA, then occluded.;    high risk for CABG;PCI 11/14/11: Bare-metal stent to the proximal LAD    . OSA on CPAP   . Hypertension   . Hyperlipidemia   . Chronic systolic heart failure     Echocardiogram 11/02/11: Moderate LVH, EF 30-35%, multiple wall motion abnormalities, critical aortic stenosis, AVA 0.6, mean gradient 45, mild MR, severe LAE  . Supraventricular tachycardia     2  syndromes-nonsustained atrial tachycardia//adenosine responsive diuretic positive reentry probably AV node reentry  . Aortic stenosis     s/p AV balloon valvuloplasty by Dr. Excell Seltzer 11/2011  . Ischemic cardiomyopathy   . H/O: GI bleed     recurrent  . Heart murmur   . CHF (congestive heart failure)     Past Surgical History  Procedure Date  . Implantation of baerveldt glaucoma device lant with scleral reinforcement using tutoplast tissue graft right eye.   . Coronary angioplasty with stent placement   . Corneal transplant     History  Smoking status  . Former Smoker  . Types: Cigarettes  . Quit date: 10/08/1960  Smokeless tobacco  . Never Used    History  Alcohol Use No    History reviewed. No pertinent family history.  Review of Systems: The review of systems is per the HPI.  All other systems were reviewed and are negative.  Physical Exam: BP 140/80  Pulse 58  Ht 5\' 7"  (1.702 m)  Wt 209 lb 1.9 oz (94.856 kg)  BMI 32.75 kg/m2  SpO2 93% Patient is an elderly male and in no acute distress. He looks frail. He is using a walker.  Skin is warm and dry. Color is normal.  Bruising of the left face noted from prior fall/syncope. HEENT is unremarkable. Normocephalic/atraumatic. PERRL. Sclera are nonicteric. Neck is supple. No masses. No JVD. Lungs are clear. Cardiac exam shows a regular rate and rhythm. Harsh murmur of AS noted. Abdomen is soft. Extremities are full without edema. Gait and ROM are intact. No gross neurologic deficits noted.   LABORATORY DATA:   Lab Results  Component Value Date   WBC 6.6 10/01/2012   HGB 15.0 10/01/2012   HCT 45.6 10/01/2012   PLT 178 10/01/2012   GLUCOSE 100* 10/01/2012   CHOL 168 07/10/2011   TRIG 242* 07/10/2011   HDL 39* 07/10/2011   LDLCALC 81 07/10/2011   ALT 10 10/01/2012   AST 13 10/01/2012   NA 137 10/01/2012   K 4.3 10/01/2012   CL 101 10/01/2012   CREATININE 1.19 10/01/2012   BUN 25* 10/01/2012   CO2 25 10/01/2012   TSH  1.57 09/23/2012   INR 0.95 10/01/2012   HGBA1C  Value: 5.4 (NOTE)  According to the ADA Clinical Practice Recommendations for 2011, when HbA1c is used as a screening test:   >=6.5%   Diagnostic of Diabetes Mellitus           (if abnormal result  is confirmed)  5.7-6.4%   Increased risk of developing Diabetes Mellitus  References:Diagnosis and Classification of Diabetes Mellitus,Diabetes Care,2011,34(Suppl 1):S62-S69 and Standards of Medical Care in         Diabetes - 2011,Diabetes Care,2011,34  (Suppl 1):S11-S61. 11/23/2010   Echo Study Conclusions from December 2013  - Left ventricle: The cavity size was mildly dilated. Wall thickness was increased in a pattern of moderate LVH. Systolic function was mildly reduced. The estimated ejection fraction was in the range of 45% to 50%. - Aortic valve: There was severe stenosis. Trivial regurgitation. - Left atrium: The atrium was moderately dilated. - Pericardium, extracardiac: A trivial pericardial effusion was identified.  Assessment / Plan: 1. Syncope - most likely due to his severe AS.  2. AS - severe - he is subsequently seen by Dr. Excell Seltzer. Patient is agreeable to proceeding on with TAVR. He will be in touch with the TAVR coordinator and order the appropriate scans. Dr. Excell Seltzer and Dr. Cornelius Moras will then see him back and decide our course of action (TAVR vs palliative care).  Patient is agreeable to this plan and will call if any problems develop in the interim.

## 2012-10-15 NOTE — Patient Instructions (Addendum)
We are going to start your work up for the TAVR  For now, stay on your current medicines  Dr. Excell Seltzer & Dr. Cornelius Moras will see you after all your scans to see if we can proceed.  Call the [facility] office at [phone] if you have any questions, problems or concerns.

## 2012-10-17 ENCOUNTER — Encounter (HOSPITAL_COMMUNITY): Payer: Self-pay | Admitting: *Deleted

## 2012-10-21 ENCOUNTER — Ambulatory Visit (HOSPITAL_COMMUNITY)
Admission: RE | Admit: 2012-10-21 | Discharge: 2012-10-21 | Disposition: A | Discharge status: Home / Self Care | Payer: Medicare Other | Source: Ambulatory Visit | Attending: Cardiovascular Disease | Admitting: Cardiovascular Disease

## 2012-10-21 DIAGNOSIS — K5732 Diverticulitis of large intestine without perforation or abscess without bleeding: Secondary | ICD-10-CM | POA: Insufficient documentation

## 2012-10-21 DIAGNOSIS — Z01818 Encounter for other preprocedural examination: Secondary | ICD-10-CM | POA: Insufficient documentation

## 2012-10-21 DIAGNOSIS — N289 Disorder of kidney and ureter, unspecified: Secondary | ICD-10-CM

## 2012-10-21 DIAGNOSIS — J438 Other emphysema: Secondary | ICD-10-CM | POA: Insufficient documentation

## 2012-10-21 DIAGNOSIS — I359 Nonrheumatic aortic valve disorder, unspecified: Secondary | ICD-10-CM | POA: Insufficient documentation

## 2012-10-21 DIAGNOSIS — R911 Solitary pulmonary nodule: Secondary | ICD-10-CM | POA: Insufficient documentation

## 2012-10-21 MED ORDER — NITROGLYCERIN 0.4 MG SL SUBL
SUBLINGUAL_TABLET | SUBLINGUAL | Status: AC
  Administered 2012-10-21: 0.4 mg via SUBLINGUAL
  Filled 2012-10-21: qty 25

## 2012-10-21 MED ORDER — IOHEXOL 350 MG/ML SOLN
80.0000 mL | Freq: Once | INTRAVENOUS | Status: AC | PRN
Start: 2012-10-21 — End: 2012-10-21
  Administered 2012-10-21: 80 mL via INTRAVENOUS

## 2012-10-21 MED ORDER — IOHEXOL 300 MG/ML  SOLN
70.0000 mL | Freq: Once | INTRAMUSCULAR | Status: AC | PRN
  Administered 2012-10-21: 70 mL via INTRAVENOUS

## 2012-10-22 ENCOUNTER — Other Ambulatory Visit: Payer: Self-pay | Admitting: Internal Medicine

## 2012-10-23 NOTE — Telephone Encounter (Signed)
Pls assign new PCP and make appt within next 90 days.

## 2012-11-07 ENCOUNTER — Ambulatory Visit (HOSPITAL_COMMUNITY)
Admission: RE | Admit: 2012-11-07 | Discharge: 2012-11-07 | Disposition: A | Discharge status: Home / Self Care | Payer: Medicare Other | Source: Ambulatory Visit | Attending: Cardiovascular Disease | Admitting: Cardiovascular Disease

## 2012-11-07 ENCOUNTER — Encounter (HOSPITAL_COMMUNITY): Payer: Self-pay | Admitting: Cardiovascular Disease

## 2012-11-07 ENCOUNTER — Encounter (HOSPITAL_COMMUNITY): Payer: Self-pay | Admitting: Thoracic Surgery (Cardiothoracic Vascular Surgery)

## 2012-11-07 ENCOUNTER — Ambulatory Visit (HOSPITAL_BASED_OUTPATIENT_CLINIC_OR_DEPARTMENT_OTHER)
Admission: RE | Admit: 2012-11-07 | Discharge: 2012-11-07 | Disposition: A | Discharge status: Home / Self Care | Payer: Medicare Other | Source: Ambulatory Visit | Attending: Thoracic Surgery (Cardiothoracic Vascular Surgery) | Admitting: Thoracic Surgery (Cardiothoracic Vascular Surgery)

## 2012-11-07 VITALS — BP 132/86 | HR 54 | Resp 19 | Ht 67.0 in | Wt 203.0 lb

## 2012-11-07 DIAGNOSIS — I359 Nonrheumatic aortic valve disorder, unspecified: Secondary | ICD-10-CM

## 2012-11-07 DIAGNOSIS — I350 Nonrheumatic aortic (valve) stenosis: Secondary | ICD-10-CM

## 2012-11-07 NOTE — Progress Notes (Signed)
[facility]   MULTIDISCIPLINARY HEART VALVE CLINIC       CARDIOTHORACIC SURGERY NOTE  Referring Provider is Rollene Rotunda, MD PCP is Jonah Blue, DO   HPI:  Patient returns for followup of severe symptomatic aortic stenosis. Since he underwent balloon aortic valvuloplasty followup echocardiograms have demonstrated recurrence of severe stenosis. He continues to have stable exertional shortness of breath. He suffered another syncopal episode and fall at Christmas time and was seen in the ED.  He has not had any further dizzy spells nor falls since then. He still is dyspneic with any activity but comfortable at rest. He is not having any chest pain.   Current Outpatient Prescriptions  Medication Sig Dispense Refill  . amiodarone (PACERONE) 200 MG tablet Take 200 mg by mouth daily.      Marland Kitchen amLODipine (NORVASC) 2.5 MG tablet Take 1 tablet (2.5 mg total) by mouth daily.  30 tablet  11  . aspirin 81 MG tablet Take 81 mg by mouth daily.        . Calcium Carbonate-Vitamin D (CALCIUM + D PO) Take 1 tablet by mouth daily.       . carvedilol (COREG) 6.25 MG tablet Take 1 tablet (6.25 mg total) by mouth 2 (two) times daily.  60 tablet  11  . docusate sodium (COLACE) 100 MG capsule Take 100 mg by mouth 2 (two) times daily.       . ferrous sulfate 325 (65 FE) MG tablet Take 325 mg by mouth daily with breakfast.      . furosemide (LASIX) 40 MG tablet Take 40 mg by mouth daily.      Marland Kitchen levothyroxine (LEVOTHROID) 25 MCG tablet Take 1 tablet (25 mcg total) by mouth daily.  30 tablet  11  . losartan (COZAAR) 50 MG tablet Take 50 mg by mouth daily.      Marland Kitchen loteprednol (LOTEMAX) 0.5 % ophthalmic suspension Place 1 drop into the right eye every morning.       . magnesium hydroxide (MILK OF MAGNESIA) 400 MG/5ML suspension Take 30 mLs by mouth daily as needed. For constipation      . pantoprazole (PROTONIX) 40 MG tablet TAKE 1 TABLET BY MOUTH EVERY DAY  30 tablet  2  . pravastatin  (PRAVACHOL) 40 MG tablet Take 1 tablet (40 mg total) by mouth every evening.  30 tablet  11  . spironolactone (ALDACTONE) 25 MG tablet Take 12.5 mg by mouth every morning.      . travoprost, benzalkonium, (TRAVATAN) 0.004 % ophthalmic solution Place 1 drop into the right eye at bedtime.            Physical Exam:   BP 132/86  Pulse 54  Resp 19  Ht 5\' 7"  (1.702 m)  Wt 203 lb (92.08 kg)  BMI 31.79 kg/m2  SpO2 93%  General:  Elderly and frail  Chest:   Few basilar crackles  CV:   Regular rhythm with systolic murmur  Incisions:  n/a  Abdomen:  Soft and nontender  Extremities:  Warm and adequately perfused  Diagnostic Tests:  Transthoracic Echocardiography  Patient: Eugene [name] MR #: [id number] Study Date: 09/23/2012 Gender: M Age: 79 Height: 167.6cm Weight: 93kg BSA: 2.42m^2 Pt. Status: Room:  ATTENDING Kristeen Miss SONOGRAPHER Luvenia Redden, RDCS ORDERING Azzie Glatter PERFORMING [facility], [facility] cc:  ------------------------------------------------------------ LV EF: 45% - 50%  ------------------------------------------------------------ Indications: 424.1 Aortic valve disorders.  ------------------------------------------------------------ History: PMH: Acquired from the patient and  from the patient's chart. Murmur. Coronary artery disease. Congestive heart failure. Aortic valve disease. PMH: Myocardial infarction. Risk factors: Hypertension. Dyslipidemia.  ------------------------------------------------------------ Study Conclusions  - Left ventricle: The cavity size was mildly dilated. Wall thickness was increased in a pattern of moderate LVH. Systolic function was mildly reduced. The estimated ejection fraction was in the range of 45% to 50%. - Aortic valve: There was severe stenosis. Trivial regurgitation. - Left atrium: The atrium was moderately dilated. - Pericardium, extracardiac: A trivial pericardial  effusion was identified. Transthoracic echocardiography. M-mode, complete 2D, spectral Doppler, and color Doppler. Height: Height: 167.6cm. Height: 66in. Weight: Weight: 93kg. Weight: 204.6lb. Body mass index: BMI: 33.1kg/m^2. Body surface area: BSA: 2.31m^2. Blood pressure: 135/70. Patient status: Outpatient. Location: [facility]  ------------------------------------------------------------  ------------------------------------------------------------ Left ventricle: The cavity size was mildly dilated. Wall thickness was increased in a pattern of moderate LVH. Systolic function was mildly reduced. The estimated ejection fraction was in the range of 45% to 50%.  ------------------------------------------------------------ Aortic valve: Moderately thickened, moderately calcified leaflets. Doppler: There was severe stenosis. Trivial regurgitation. VTI ratio of LVOT to aortic valve: 0.13. Valve area: 0.41cm^2(VTI). Indexed valve area: 0.2cm^2/m^2 (VTI). Peak velocity ratio of LVOT to aortic valve: 0.14. Valve area: 0.44cm^2 (Vmax). Indexed valve area: 0.22cm^2/m^2 (Vmax). Mean gradient: 43mm Hg (S). Peak gradient: 76mm Hg (S).  ------------------------------------------------------------ Mitral valve: Doppler: Trivial regurgitation.  ------------------------------------------------------------ Left atrium: The atrium was moderately dilated.  ------------------------------------------------------------ Right ventricle: The cavity size was normal. Wall thickness was normal. Systolic function was normal.  ------------------------------------------------------------ Pulmonic valve: Doppler: No significant regurgitation.  ------------------------------------------------------------ Tricuspid valve: Doppler: No significant regurgitation.  ------------------------------------------------------------ Right atrium: The atrium was normal in  size.  ------------------------------------------------------------ Pericardium: A trivial pericardial effusion was identified.  ------------------------------------------------------------  2D measurements Normal Doppler measurements Normal Left ventricle Main pulmonary LVID ED, 60.7 mm 43-52 artery chord, Pressure, 30 mm Hg =30 PLAX S LVID ES, 46.7 mm 23-38 LVOT chord, Peak vel, 60.9 cm/s ------ PLAX S FS, chord, 23 % >29 VTI, S 16.8 cm ------ PLAX Aortic valve LVPW, ED 9.88 mm ------ Peak vel, 436 cm/s ------ IVS/LVPW 1.32 <1.3 S ratio, ED Mean vel, 302 cm/s ------ Ventricular septum S IVS, ED 13 mm ------ VTI, S 129 cm ------ LVOT Mean 43 mm Hg ------ Diam, S 20 mm ------ gradient, Area 3.14 cm^2 ------ S Aorta Peak 76 mm Hg ------ Root diam, 42 mm ------ gradient, ED S Left atrium VTI ratio 0.13 ------ AP dim 52 mm ------ LVOT/AV AP dim 2.57 cm/m^2 <2.2 Area, VTI 0.41 cm^2 ------ index Area index 0.2 cm^2/m ------ (VTI) ^2 Peak vel 0.14 ------ ratio, LVOT/AV Area, Vmax 0.44 cm^2 ------ Area index 0.22 cm^2/m ------ (Vmax) ^2 Mitral valve Peak E vel 69.1 cm/s ------ Peak A vel 92.3 cm/s ------ Decelerati 567 ms 150-23 on time 0 Peak E/A 0.7 ------ ratio Max regurg 577 cm/s ------ vel Regurg VTI 236 cm ------ Tricuspid valve Regurg 252 cm/s ------ peak vel Peak RV-RA 25 mm Hg ------ gradient, S Systemic veins Estimated 5 mm Hg ------ CVP Right ventricle Pressure, 30 mm Hg <30 S  ------------------------------------------------------------ Prepared and Electronically Authenticated by  Kristeen Miss 2013-12-17T18:22:20.160        CT ANGIOGRAPHY OF THE HEART, CORONARY ARTERY, STRUCTURE, AND  MORPHOLOGY  COMPARISON: No priors.  CONTRAST: 80mL OMNIPAQUE IOHEXOL 350 MG/ML SOLN  TECHNIQUE: CT angiography of the coronary vessels was performed on  a 256 channel system using prospective ECG gating. A scout and ECG-  gated noncontrast exam (for  calcium scoring) were performed.  Appropriate delay was determined by bolus tracking after injection  of iodinated contrast, and an ECG-gated coronary CTA was performed  with sub-mm slice collimation throughout the cardiac cycle.  Imaging post processing was performed on an independent workstation  creating multiplanar and 3-D images, allowing for quantitative  analysis of the heart and coronary arteries. Note that this exam  targets the heart and the chest was not imaged in its entirety.  PREMEDICATION:  None  FINDINGS:  Technical quality: Good.  Heart rate: 55.  CORONARY ARTERIES: Assessment of the coronary arteries is  nondiagnostic secondary to the small caliber and high calcification  of the coronary arteries, as well as some motion related artifact,  which limits accurate assessment for percentage of luminal  stenosis.  AORTIC ROOT:  Aortic Valve Description: The aortic valve is tricuspid with  marked thickening and calcification of each of the cusps.  Aortic Valve Area: 0.85 cm-sq  Aortic Annulus (systolic measurents):  Long-axis: 29.9 mm  Short-axis: 25.0 mm  Cross-sectional area: 5.29 cm-sq  Circumference: 84.0 mm  Sinuses of Valsalva (diastolic measurements:  L-SOV - Width: 33.4 mm  Height: 18.9 mm  R-SOV - Width: 33.3 mm  Height: 21.4 mm  Aspinwall-SOV - Width: 36.1 mm  Height: 25.7 mm  Coronary Artery Ostia:  L Main Ostium - 15.4 mm above the annulus.  RCA Ostium - 15.8 mm above the annulus.  OTHER AORTA AND PULMONARY MEASUREMENTS:  Ascending aorta: (< 40 mm): 36 mm  Descending aorta: (< 40 mm): 31 mm  Main pulmonary artery: (< 30 mm): 25 mm  OTHER FINDINGS:  There is a mosaic appearance of the lung parenchyma, suggesting air  trapping from small airway disease. The lateral segment of the  right middle lobe there is a 4 mm nodule (image 51 of series 4)  which is nonspecific. Architectural distortion in the left lower  lobe likely reflects chronic scarring. No acute  consolidative  airspace disease. No pleural effusions. There are some patchy  peripheral areas of subpleural reticulation and ground-glass  attenuation with slight upper lung predominance, which is  nonspecific, but could be indicative of an interstitial lung  disease such as nonspecific interstitial pneumonia (NSIP). There  is also a background of mild centrilobular emphysema and mild  diffuse bronchial wall thickening. There are no aggressive  appearing lytic or blastic lesions noted in the visualized portions  of the skeleton.  IMPRESSION:  1. The severely sclerotic tricuspid aortic valve with estimated  valve area of 0.85 cm2 by planimetry, compatible with severe aortic  stenosis. Aortic root measurements pertinent to potential TAVR  procedure, as detailed above. There are no adverse aortic root  features to suggest potential contraindication to TAVR procedure.  2. Assessment of the coronary arteries was nondiagnostic on this  examination secondary to the small size of the vessels, high  calcification of the vessels, and motion related artifact.  3. The appearance of the lungs suggests some degree of air  trapping for small airway disease. The patient also has diffuse  bronchial wall thickening and mild centrilobular emphysema, imaging  findings compatible with underlying COPD.  4. There is a pattern of some subpleural reticulation with some  patchy peripheral ground-glass attenuation that has a slight upper  lung predominance. 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  5. 4 mm nodule in the lateral segment of the right middle lobe. If  the patient is at high risk for bronchogenic carcinoma, follow-up  chest CT at 1 year is recommended. If the patient is at low risk,  no follow-up is needed. This recommendation follows the consensus  statement: Guidelines for Management of Small Pulmonary Nodules  Detected on CT Scans: A  Statement from the Fleischner Society as  published in Radiology 2005; 237:395-400.  Original Report Authenticated By: Trudie Reed, M.D.    CT ANGIOGRAPHY ABDOMEN AND PELVIS  Technique: Multidetector CT imaging of the abdomen and pelvis was  performed using the standard protocol during bolus administration  of intravenous contrast. Multiplanar reconstructed images  including MIPs were obtained and reviewed to evaluate the vascular  anatomy.  Contrast: 70mL OMNIPAQUE IOHEXOL 300 MG/ML SOLN  Comparison: No priors.  Findings:  VASCULAR MEASUREMENTS PERTINENT TO TAVR:  AORTA:  Minimal Aortic Diameter - 13 x 11 mm (this is immediately above  the inferior mesenteric artery origin where there is nearly  circumferential thick calcified plaque)  Severity of Aortic Calcification - severe  RIGHT PELVIS:  Right Common Iliac Artery -  Minimal Diameter - 12.8 x 7.0 mm  Tortuosity - moderate  Calcification - moderate  Right External Iliac Artery -  Minimal Diameter - 9.9 x 9.3 mm  Tortuosity - mild  Calcification - none  Right Common Femoral Artery -  Minimal Diameter - 9.0 x 10.1 mm  Tortuosity - minimal  Calcification - mild  LEFT PELVIS:  Left Common Iliac Artery -  Minimal Diameter - 13.0 x 12.4 mm  Tortuosity - mild  Calcification - mild  Left External Iliac Artery -  Minimal Diameter - 9.9 x 9.4 mm  Tortuosity - mild  Calcification - none  Left Common Femoral Artery -  Minimal Diameter - 10.6 x 10.2 mm  Tortuosity - minimal  Calcification - mild  Lung Bases: Mosaic attenuation in the lung parenchyma with areas of  ground-glass attenuation interspersed with areas of lucency with  geographic margins, suggesting air trapping related to a small  airway disease. 4 mm nodule in the lateral segment of the right  middle lobe (image 3 of series 6). Focal area of architectural  distortion in the left lower lobe, likely to represent an area of  chronic scarring.  Abdomen/Pelvis:  Numerous well-defined low and intermediate  attenuation hepatic lesions, largest of which are likely to  represent cysts, measuring up to 2.3 cm in diameter in segment 4A  Small high attenuation lesion in the dependent portion of the  gallbladder is most compatible with a small gallstone. No findings  to suggest acute cholecystitis at this time. The appearance of the  pancreas and spleen is unremarkable. There is adreniform  thickening of the left adrenal gland without a dominant nodule.  There is a small 1.1 x 0.9 cm nodule in the right adrenal gland  which is incompletely characterized. Multiple renal lesions are  noted bilaterally, the vast majority are low attenuation and well-  defined, although sub centimeter in size and too small to  definitively characterize. There is a cyst measuring 2.0 cm in  diameter in the lower pole of the right kidney with some dependent  calcifications (likely some milk of calcium). No definite  suspicious-appearing renal lesions are noted. Extensive  atherosclerosis of the abdominal and pelvic vasculature, with  vascular measurements pertinent to TAVR procedure, as detailed  above. In addition, on image 105 of series 5 there is a small  ulcerated plaque in the infrarenal abdominal aorta, shortly before  the inferior mesenteric artery origin.  Numerous colonic diverticula are noted, without surrounding  inflammatory changes to suggest acute diverticulitis at this time.  Normal appendix. No ascites or pneumoperitoneum and no pathologic  distension of small bowel. A tiny umbilical hernia containing only  omental fat. Prostate urinary bladder are unremarkable in  appearance.  Musculoskeletal: There are no aggressive appearing lytic or blastic  lesions noted in the visualized portions of the skeleton. There  are multiple vertebral body compression fractures at L2, L3 and L4.  At L2 there is approximately 25% loss of anterior vertebral body  height, at L3  there is approximately 70% loss of anterior vertebral  body height and 30% loss of posterior vertebral body height, and L4  there is approximately 40% loss of anterior vertebral body height.  L2 and L3 appear similar to prior lumbar spine radiograph  10/10/2009, however, the compression of L4 has progressed since the  prior examination. 5 mm of anterolisthesis of L4 upon L5 is noted,  similar to the prior study.  Review of the MIP images confirms the above findings.  IMPRESSION:  1. Vascular measurements pertinent to potential TAVR procedure, as  detailed above. The patient does appear to have a suitable pelvic  access, and based off the lower degree of calcification and  tortuosity, left sided access may be preferable.  2. 4 mm right middle lobe nodule. This is highly nonspecific. If  the patient is at high risk for bronchogenic carcinoma, follow-up  chest CT at 1 year is recommended. If the patient is at low risk,  no follow-up is needed. This recommendation follows the consensus  statement: Guidelines for Management of Small Pulmonary Nodules  Detected on CT Scans: A Statement from the Fleischner Society as  published in Radiology 2005; 237:395-400.  3. The appearance of the visualized lung parenchyma suggests  significant areas of air trapping, likely from a small airways  disease.  4. Multiple hepatic and renal lesions, as above, many of which are  too small to definitively characterize. Statistically, these are  all favored to represent cysts.  5. Cholelithiasis without findings to suggest acute cholecystitis  at this time.  6. Extensive colonic diverticulosis without findings to suggest  acute diverticulitis.  7. Multiple vertebral body compression fractures, as above,  including a fracture at L4 which appears to have progressed  compared to the prior study from 10/10/2009.  Original Report Authenticated By: Trudie Reed, M.D.     Impression:  The patient has severe  symptomatic aortic stenosis with chronic diastolic congestive heart failure further exacerbated by history of coronary artery disease, long-standing hypertension and atrial fibrillation.  Left ventricular systolic function is mildly reduced. The patient remains clinically stable although he is had another syncopal episode with fall recently. Speaking directly with him and his family it is unclear whether or not his falls or at least partly related to his limited mobility and difficulty with balance. Based upon his recent cardiac CT scan his aortic valve annulus is too big for transcatheter aortic valve replacement using currently available commercial devices.   Plan:  Dr. Excell Seltzer and I spent in excess of 30 minutes with the patient and his family discussing options. The patient would not be candidate for conventional aortic valve replacement and he is not interested in conventional surgery. Options include continued medical therapy for the time being with reconsideration for possible transcatheter aortic valve replacement in the future when commercially available devices include devices sized appropriately for the patient. It  is very possible that within the next few months a larger device will become available. Alternatively we discussed the possibility of sending the patient to a tertiary referral center where ongoing clinical trials are being performed that include larger sized valves.  The patient is not interested in being referred at this time and prefers to continue to followup with [place]. He will be seen in 2 months by Dr. Excell Seltzer.   Salvatore Decent. Cornelius Moras, MD 11/07/2012 6:05 PM

## 2012-11-09 ENCOUNTER — Encounter (HOSPITAL_COMMUNITY): Payer: Self-pay | Admitting: Cardiovascular Disease

## 2012-11-09 NOTE — Progress Notes (Signed)
MULTIDISCIPLINARY HEART VALVE CLINIC NOTE  HPI:  Mr Fertig returns for follow-up of his severe aortic stenosis. The patient presented in January 2013 with syncope and ultimately underwent balloon aortic valvuloplasty of a severely stenotic aortic mouth. He also underwent stenting of critical stenosis in the proximal LAD. He has undergone evaluation for TAVR and returns to the multidisciplinary heart clinic for further discussion.  He last had a syncopal episode in December 2014. He's had recurrent falls it is unclear whether this is related to hemodynamic changes or gait instability. He continues to have some limitation related to exertional dyspnea. He has no resting dyspnea or chest tightness. He denies edema, orthopnea, PND, or palpitations. Since he was seen last he has undergone a gated cardiac CTA and a CTA of his abdomen and pelvis.  Allergies  Allergen Reactions  . Penicillins Swelling    REACTION: "turns red"    Past Medical History  Diagnosis Date  . Glaucoma(365)     Right eye  . Diverticulosis     multiple hospital admissions for GI bleed, has an episode roughly every 6 months,, last  colonscopy in 2006 showed diverticulosis, was evaluated by Dr. Madilyn Fireman in the hospital in 2011 for lower GI bleed andd it  was suggested that the patient was not actively bleeding at that time and given his comorbid conditions colonoscopy was deferred. He is also chronically constipated  . Macular degeneration of right eye   . Allergic rhinitis   . CAD (coronary artery disease)     s/p PCI in 1991;  Cardiac catheterization 11/02/11: Ostial LAD 95% with aneurysmal dilatation after this, proximal circumflex 70% with a superior branch 30-40%, proximal RCA 75% and 90%, mid RCA 50% and 90% before a PDA, then occluded.;    high risk for CABG;PCI 11/14/11: Bare-metal stent to the proximal LAD    . OSA on CPAP   . Hypertension   . Hyperlipidemia   . Chronic systolic heart failure     Echocardiogram 11/02/11:  Moderate LVH, EF 30-35%, multiple wall motion abnormalities, critical aortic stenosis, AVA 0.6, mean gradient 45, mild MR, severe LAE  . Supraventricular tachycardia     2 syndromes-nonsustained atrial tachycardia//adenosine responsive diuretic positive reentry probably AV node reentry  . Aortic stenosis     s/p AV balloon valvuloplasty by Dr. Excell Seltzer 11/2011  . Ischemic cardiomyopathy   . H/O: GI bleed     recurrent  . Heart murmur   . CHF (congestive heart failure)    ROS: Negative except as per HPI  BP 132/86  Pulse 54  Resp 19  Ht 5\' 7"  (1.702 m)  Wt 203 lb (92.08 kg)  BMI 31.79 kg/m2  SpO2 93%  PHYSICAL EXAM: Pt is alert and oriented, pleasant elderly man in NAD Remaining exam deferred today time was spent in discussion  2-D echo: Left ventricle: The cavity size was mildly dilated. Wall thickness was increased in a pattern of moderate LVH. Systolic function was mildly reduced. The estimated ejection fraction was in the range of 45% to 50%.  ------------------------------------------------------------ Aortic valve: Moderately thickened, moderately calcified leaflets. Doppler: There was severe stenosis. Trivial regurgitation. VTI ratio of LVOT to aortic valve: 0.13. Valve area: 0.41cm^2(VTI). Indexed valve area: 0.2cm^2/m^2 (VTI). Peak velocity ratio of LVOT to aortic valve: 0.14. Valve area: 0.44cm^2 (Vmax). Indexed valve area: 0.22cm^2/m^2 (Vmax). Mean gradient: 43mm Hg (S). Peak gradient: 76mm Hg (S).  ------------------------------------------------------------ Mitral valve: Doppler: Trivial regurgitation.  ------------------------------------------------------------ Left atrium: The atrium was moderately dilated.  ------------------------------------------------------------  Right ventricle: The cavity size was normal. Wall thickness was normal. Systolic function was normal.  ------------------------------------------------------------ Pulmonic valve: Doppler:  No significant regurgitation.  ------------------------------------------------------------ Tricuspid valve: Doppler: No significant regurgitation.  ------------------------------------------------------------ Right atrium: The atrium was normal in size.  ------------------------------------------------------------ Pericardium: A trivial pericardial effusion was identified.  Cardiac CTA: Aortic Valve Description: The aortic valve is tricuspid with  marked thickening and calcification of each of the cusps.  Aortic Valve Area: 0.85 cm-sq  Aortic Annulus (systolic measurents):  Long-axis: 29.9 mm  Short-axis: 25.0 mm  Cross-sectional area: 5.29 cm-sq  Circumference: 84.0 mm  Sinuses of Valsalva (diastolic measurements:  L-SOV - Width: 33.4 mm  Height: 18.9 mm  R-SOV - Width: 33.3 mm  Height: 21.4 mm  [place]-SOV - Width: 36.1 mm  Height: 25.7 mm  Coronary Artery Ostia:  L Main Ostium - 15.4 mm above the annulus.  RCA Ostium - 15.8 mm above the annulus.  OTHER AORTA AND PULMONARY MEASUREMENTS:  Ascending aorta: (< 40 mm): 36 mm  Descending aorta: (< 40 mm): 31 mm  Main pulmonary artery: (< 30 mm): 25 mm  OTHER FINDINGS:  There is a mosaic appearance of the lung parenchyma, suggesting air  trapping from small airway disease. The lateral segment of the  right middle lobe there is a 4 mm nodule (image 51 of series 4)  which is nonspecific. Architectural distortion in the left lower  lobe likely reflects chronic scarring. No acute consolidative  airspace disease. No pleural effusions. There are some patchy  peripheral areas of subpleural reticulation and ground-glass  attenuation with slight upper lung predominance, which is  nonspecific, but could be indicative of an interstitial lung  disease such as nonspecific interstitial pneumonia (NSIP). There  is also a background of mild centrilobular emphysema and mild  diffuse bronchial wall thickening. There are no aggressive  appearing  lytic or blastic lesions noted in the visualized portions  of the skeleton.  IMPRESSION:  1. The severely sclerotic tricuspid aortic valve with estimated  valve area of 0.85 cm2 by planimetry, compatible with severe aortic  stenosis. Aortic root measurements pertinent to potential TAVR  procedure, as detailed above. There are no adverse aortic root  features to suggest potential contraindication to TAVR procedure.  2. Assessment of the coronary arteries was nondiagnostic on this  examination secondary to the small size of the vessels, high  calcification of the vessels, and motion related artifact.  3. The appearance of the lungs suggests some degree of air  trapping for small airway disease. The patient also has diffuse  bronchial wall thickening and mild centrilobular emphysema, imaging  findings compatible with underlying COPD.  4. There is a pattern of some subpleural reticulation with some  patchy peripheral ground-glass attenuation that has a slight upper  lung predominance. 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  5. 4 mm nodule in the lateral segment of the right middle lobe. If  the patient is at high risk for bronchogenic carcinoma, follow-up  chest CT at 1 year is recommended. If the patient is at low risk,  no follow-up is needed. This recommendation follows the consensus  statement: Guidelines for Management of Small Pulmonary Nodules  Detected on CT Scans: A Statement from the Fleischner Society as  published in Radiology 2005; 237:395-400.   CTA ABD/PELVIS: VASCULAR MEASUREMENTS PERTINENT TO TAVR:  AORTA:  Minimal Aortic Diameter - 13 x 11 mm (this is immediately above  the  inferior mesenteric artery origin where there is nearly  circumferential thick calcified plaque)  Severity of Aortic Calcification - severe  RIGHT PELVIS:  Right Common Iliac Artery -  Minimal Diameter - 12.8 x 7.0 mm  Tortuosity - moderate   Calcification - moderate  Right External Iliac Artery -  Minimal Diameter - 9.9 x 9.3 mm  Tortuosity - mild  Calcification - none  Right Common Femoral Artery -  Minimal Diameter - 9.0 x 10.1 mm  Tortuosity - minimal  Calcification - mild  LEFT PELVIS:  Left Common Iliac Artery -  Minimal Diameter - 13.0 x 12.4 mm  Tortuosity - mild  Calcification - mild  Left External Iliac Artery -  Minimal Diameter - 9.9 x 9.4 mm  Tortuosity - mild  Calcification - none  Left Common Femoral Artery -  Minimal Diameter - 10.6 x 10.2 mm  Tortuosity - minimal  Calcification - mild  IMPRESSION:  1. Vascular measurements pertinent to potential TAVR procedure, as  detailed above. The patient does appear to have a suitable pelvic  access, and based off the lower degree of calcification and  tortuosity, left sided access may be preferable.  ASSESSMENT AND PLAN: 79 year old gentleman with severe symptomatic aortic stenosis. I evaluated him today in conjunction with Dr. Cornelius Moras. We spent in excess of 30 minutes in discussion with the patient. He is not a candidate for traditional open cardiac surgery. Therefore, he is being considered for transcatheter aortic valve replacement. His aortic valve annulus appears to large for the currently available commercial valves. We discussed options of waiting until the larger valve is available. This is anticipated within the calendar year. We also discussed whether he would want to be referred to a tertiary center to potential he received the valve through a clinical trial. He is not interested in referral elsewhere. I will see him back in 2 months for followup and we will continue to observe him at this time. Fortunately his cardiac status appears stable at this time.  If we get to a point where he is ready to proceed with TAVR, he will require repeat coronary angiography because of his history of severe LAD stenosis and treatment with bare-metal stenting now one year  ago.  Tonny Bollman 11/09/2012 6:06 AM

## 2012-11-12 ENCOUNTER — Encounter: Payer: Medicare Other | Admitting: Surgery

## 2012-11-27 ENCOUNTER — Ambulatory Visit (INDEPENDENT_AMBULATORY_CARE_PROVIDER_SITE_OTHER): Payer: Medicare Other | Admitting: Internal Medicine

## 2012-11-27 ENCOUNTER — Encounter: Payer: Self-pay | Admitting: Internal Medicine

## 2012-11-27 VITALS — BP 147/78 | HR 54 | Temp 97.0°F | Ht 67.0 in | Wt 211.7 lb

## 2012-11-27 DIAGNOSIS — R296 Repeated falls: Secondary | ICD-10-CM

## 2012-11-27 DIAGNOSIS — E785 Hyperlipidemia, unspecified: Secondary | ICD-10-CM

## 2012-11-27 DIAGNOSIS — R7302 Impaired glucose tolerance (oral): Secondary | ICD-10-CM

## 2012-11-27 LAB — LIPID PANEL
Cholesterol: 132 mg/dL (ref 0–200)
Triglycerides: 186 mg/dL — ABNORMAL HIGH (ref <150)

## 2012-11-27 LAB — HEMOGLOBIN A1C: Mean Plasma Glucose: 117 mg/dL — ABNORMAL HIGH (ref <117)

## 2012-11-27 NOTE — Progress Notes (Signed)
  Subjective:    Patient ID: Eugene [name], male    DOB: [birth year], 79 y.o.   MRN: [id number]  HPI Today is the first time I establish a physician-patient relationship with this patient.  Feels well.  States he's discussing aortic valve replacement with his cardiologist.  Denies CP.  Denies syncope.  States he fell three weeks ago while going up his ramp and lost his balance.  He also fell on christmas.  He uses a walker.  He feels he needs help with his balance. He was able to walk to clinic today from the parking lot without much DOE.  He lives with his grandson and states "he's lazy" and uses profanity to describe him. He has no other complaints for me.  Review of Systems Complete 12 point review of systems is otherwise negative except for that stated in the HPI.    Objective:   Physical Exam Filed Vitals:   11/27/12 1051  BP: 147/78  Pulse: 54  Temp: 97 F (36.1 C)   GEN: AAOx,3 NAD. HEENT: EOMI, PERRLA, no icterus. CV: S1S2, 4/6 SEM, decreased S2. PULM: CTA bilat ABD/GI: Soft, NT, +BS, he has a 2x3 cm echymotic region over his LUQ, no guarding, non tender. LE/UE: 2/4 pulses, trace bilat pitting edema of LE. NEURO: CN II-XII intact, no focal deficits.      Assessment & Plan:   79 yr. Old male w/ hx severe symptomatic AS followed by cardiology, CAD w/ severe LAD stenosis s/p BMS placement, HL, ISCM Chronic systolic heart failure, P Afib, presents for follow up. 1) Fall: Refer to PT. Continue to use walker.  I advised him to attempt to walk with someone next to him.  He has a friend with him that assists him.  I asked him to please help him with ambulation as much as he can. He agrees that his balance is what he needs the most help with. 2) HTN: States this morning his BP was 90/60, went up after walking here from the parking garage. No changes today. 3) HL: Check Lipid panel. 4) Health Maintenance: Flu, DTap, Pneumovax UTD.  Return in three months.   Jonah Blue

## 2012-11-27 NOTE — Patient Instructions (Signed)
Blood work today. Referral to physical therapy made. Return in three months.

## 2012-12-11 ENCOUNTER — Encounter: Payer: Self-pay | Admitting: *Deleted

## 2012-12-26 ENCOUNTER — Ambulatory Visit: Payer: Medicare Other | Attending: Internal Medicine | Admitting: Physical Therapy

## 2012-12-26 DIAGNOSIS — IMO0001 Reserved for inherently not codable concepts without codable children: Secondary | ICD-10-CM | POA: Insufficient documentation

## 2012-12-26 DIAGNOSIS — R269 Unspecified abnormalities of gait and mobility: Secondary | ICD-10-CM | POA: Insufficient documentation

## 2012-12-26 DIAGNOSIS — M6281 Muscle weakness (generalized): Secondary | ICD-10-CM | POA: Insufficient documentation

## 2012-12-31 ENCOUNTER — Ambulatory Visit: Payer: Medicare Other | Admitting: Physical Therapy

## 2012-12-31 ENCOUNTER — Other Ambulatory Visit: Payer: Self-pay | Admitting: Cardiology

## 2013-01-05 ENCOUNTER — Ambulatory Visit: Payer: Medicare Other | Admitting: Physical Therapy

## 2013-01-06 ENCOUNTER — Other Ambulatory Visit: Payer: Self-pay | Admitting: Cardiology

## 2013-01-12 ENCOUNTER — Encounter: Payer: Self-pay | Admitting: Internal Medicine

## 2013-01-12 DIAGNOSIS — R269 Unspecified abnormalities of gait and mobility: Secondary | ICD-10-CM | POA: Insufficient documentation

## 2013-01-13 ENCOUNTER — Ambulatory Visit: Payer: Medicare Other | Attending: Internal Medicine | Admitting: Physical Therapy

## 2013-01-13 DIAGNOSIS — M6281 Muscle weakness (generalized): Secondary | ICD-10-CM | POA: Insufficient documentation

## 2013-01-13 DIAGNOSIS — IMO0001 Reserved for inherently not codable concepts without codable children: Secondary | ICD-10-CM | POA: Insufficient documentation

## 2013-01-13 DIAGNOSIS — R269 Unspecified abnormalities of gait and mobility: Secondary | ICD-10-CM | POA: Insufficient documentation

## 2013-01-14 ENCOUNTER — Other Ambulatory Visit: Payer: Self-pay | Admitting: Cardiology

## 2013-01-14 ENCOUNTER — Other Ambulatory Visit: Payer: Self-pay | Admitting: *Deleted

## 2013-01-14 DIAGNOSIS — E039 Hypothyroidism, unspecified: Secondary | ICD-10-CM

## 2013-01-14 MED ORDER — LEVOTHYROXINE SODIUM 25 MCG PO TABS: 25.0000 ug | ORAL_TABLET | Freq: Every day | ORAL | Status: DC

## 2013-01-16 ENCOUNTER — Ambulatory Visit: Payer: Medicare Other | Admitting: Physical Therapy

## 2013-01-17 ENCOUNTER — Other Ambulatory Visit: Payer: Self-pay | Admitting: Internal Medicine

## 2013-01-17 ENCOUNTER — Other Ambulatory Visit: Payer: Self-pay | Admitting: Cardiology

## 2013-01-20 ENCOUNTER — Encounter: Payer: Medicare Other | Admitting: Physical Therapy

## 2013-01-21 ENCOUNTER — Ambulatory Visit: Payer: Medicare Other | Admitting: Physical Therapy

## 2013-01-26 ENCOUNTER — Encounter: Payer: Self-pay | Admitting: Physical Therapy

## 2013-01-26 ENCOUNTER — Encounter (HOSPITAL_COMMUNITY): Payer: Self-pay | Admitting: *Deleted

## 2013-01-28 ENCOUNTER — Ambulatory Visit: Payer: Medicare Other | Admitting: Physical Therapy

## 2013-02-06 ENCOUNTER — Ambulatory Visit: Payer: Medicare Other | Attending: Internal Medicine | Admitting: Physical Therapy

## 2013-02-06 DIAGNOSIS — R269 Unspecified abnormalities of gait and mobility: Secondary | ICD-10-CM | POA: Insufficient documentation

## 2013-02-06 DIAGNOSIS — IMO0001 Reserved for inherently not codable concepts without codable children: Secondary | ICD-10-CM | POA: Insufficient documentation

## 2013-02-06 DIAGNOSIS — M6281 Muscle weakness (generalized): Secondary | ICD-10-CM | POA: Insufficient documentation

## 2013-02-07 ENCOUNTER — Other Ambulatory Visit: Payer: Self-pay | Admitting: Cardiology

## 2013-02-13 ENCOUNTER — Ambulatory Visit: Payer: Medicare Other | Admitting: Physical Therapy

## 2013-02-14 ENCOUNTER — Other Ambulatory Visit: Payer: Self-pay | Admitting: Internal Medicine

## 2013-02-17 ENCOUNTER — Ambulatory Visit: Payer: Medicare Other

## 2013-02-20 ENCOUNTER — Ambulatory Visit (INDEPENDENT_AMBULATORY_CARE_PROVIDER_SITE_OTHER): Payer: Medicare Other | Admitting: Cardiovascular Disease

## 2013-02-20 ENCOUNTER — Ambulatory Visit: Payer: Medicare Other | Admitting: Cardiovascular Disease

## 2013-02-20 ENCOUNTER — Encounter: Payer: Self-pay | Admitting: Cardiovascular Disease

## 2013-02-20 VITALS — BP 136/62 | HR 58 | Ht 67.0 in | Wt 203.0 lb

## 2013-02-20 DIAGNOSIS — I4891 Unspecified atrial fibrillation: Secondary | ICD-10-CM

## 2013-02-20 NOTE — Patient Instructions (Addendum)
Your physician has requested that you have an echocardiogram. Echocardiography is a painless test that uses sound waves to create images of your heart. It provides your doctor with information about the size and shape of your heart and how well your heart's chambers and valves are working. This procedure takes approximately one hour. There are no restrictions for this procedure.  Your physician recommends that you return for lab work: LIVER and BMP (same day as Echo)  Your physician recommends that you schedule a follow-up appointment in: 3 MONTHS with Dr Excell Seltzer  Your physician recommends that you continue on your current medications as directed. Please refer to the Current Medication list given to you today.

## 2013-02-21 ENCOUNTER — Encounter: Payer: Self-pay | Admitting: Cardiovascular Disease

## 2013-02-21 NOTE — Progress Notes (Signed)
HPI:   Eugene [name] returns for follow-up evaluation today. He is followed for aortic stenosis, CAD, and diastolic heart failure. He presented in January 2013 with syncope and was noted to have progressive and severe aortic stenosis. He underwent balloon aortic valvuloplasty as a palliative therapy and also was treated with stenting of the proximal LAD. He has been followed in the multidisciplinary valve clinic and is awaiting commercial release of a 29 mm transcatheter valve. He has been offered referral to a tertiary center for enrollment in a clinical TAVR trial but has declined this.   Overall he reports stable symptoms of exertional dyspnea with low-level activity (NYHA Class 3). He has no resting shortness of breath. He denies orthopnea, PND, or recurrence of syncope. He has no exertional chest pain or pressure. He denies further bleeding problems (GI bleed in 2013 on ASA and plavix). He has developed ulcerations on his toes and has seen a podiatrist for this. Thought to have occurred because of shoes being too tight.  Outpatient Encounter Prescriptions as of 02/20/2013  Medication Sig Dispense Refill  . amiodarone (PACERONE) 200 MG tablet TAKE 1 TABLET EVERY DAY  30 tablet  5  . amLODipine (NORVASC) 2.5 MG tablet TAKE 1 TABLET EVERY DAY  30 tablet  11  . aspirin 81 MG tablet Take 81 mg by mouth daily.        . Calcium Carbonate-Vitamin D (CALCIUM + D PO) Take 1 tablet by mouth daily.       . carvedilol (COREG) 6.25 MG tablet Take 1 tablet (6.25 mg total) by mouth 2 (two) times daily.  60 tablet  11  . docusate sodium (COLACE) 100 MG capsule Take 100 mg by mouth 2 (two) times daily.       . ferrous sulfate 325 (65 FE) MG tablet Take 325 mg by mouth daily with breakfast.      . furosemide (LASIX) 40 MG tablet TAKE 1 TABLET EVERY MORNING  30 tablet  5  . levothyroxine (LEVOTHROID) 25 MCG tablet Take 1 tablet (25 mcg total) by mouth daily.  30 tablet  3  . losartan (COZAAR) 50 MG tablet TAKE 1  TABLET (50 MG TOTAL) BY MOUTH DAILY.  30 tablet  1  . loteprednol (LOTEMAX) 0.5 % ophthalmic suspension Place 1 drop into the right eye every morning.       . magnesium hydroxide (MILK OF MAGNESIA) 400 MG/5ML suspension Take 30 mLs by mouth daily as needed. For constipation      . pantoprazole (PROTONIX) 40 MG tablet TAKE 1 TABLET BY MOUTH EVERY DAY  30 tablet  2  . pravastatin (PRAVACHOL) 40 MG tablet TAKE 1 TABLET (40 MG TOTAL) BY MOUTH EVERY EVENING.  30 tablet  5  . spironolactone (ALDACTONE) 25 MG tablet TAKE 1/2 TABLET EVERY MORNING  15 tablet  12  . travoprost, benzalkonium, (TRAVATAN) 0.004 % ophthalmic solution Place 1 drop into the right eye at bedtime.        . [DISCONTINUED] furosemide (LASIX) 40 MG tablet Take 40 mg by mouth daily.      . [DISCONTINUED] losartan (COZAAR) 50 MG tablet Take 50 mg by mouth daily.       No facility-administered encounter medications on file as of 02/20/2013.    Allergies  Allergen Reactions  . Penicillins Swelling    REACTION: "turns red"    Past Medical History  Diagnosis Date  . Glaucoma(365)     Right eye  . Diverticulosis  multiple hospital admissions for GI bleed, has an episode roughly every 6 months,, last  colonscopy in 2006 showed diverticulosis, was evaluated by Dr. Madilyn Fireman in the hospital in 2011 for lower GI bleed andd it  was suggested that the patient was not actively bleeding at that time and given his comorbid conditions colonoscopy was deferred. He is also chronically constipated  . Macular degeneration of right eye   . Allergic rhinitis   . CAD (coronary artery disease)     s/p PCI in 1991;  Cardiac catheterization 11/02/11: Ostial LAD 95% with aneurysmal dilatation after this, proximal circumflex 70% with a superior branch 30-40%, proximal RCA 75% and 90%, mid RCA 50% and 90% before a PDA, then occluded.;    high risk for CABG;PCI 11/14/11: Bare-metal stent to the proximal LAD    . OSA on CPAP   . Hypertension   .  Hyperlipidemia   . Chronic systolic heart failure     Echocardiogram 11/02/11: Moderate LVH, EF 30-35%, multiple wall motion abnormalities, critical aortic stenosis, AVA 0.6, mean gradient 45, mild Eugene, severe LAE  . Supraventricular tachycardia     2 syndromes-nonsustained atrial tachycardia//adenosine responsive diuretic positive reentry probably AV node reentry  . Aortic stenosis     s/p AV balloon valvuloplasty by Dr. Excell Seltzer 11/2011  . Ischemic cardiomyopathy   . H/O: GI bleed     recurrent  . Heart murmur   . CHF (congestive heart failure)     ROS: Negative except as per HPI  BP 136/62  Pulse 58  Ht 5\' 7"  (1.702 m)  Wt 92.08 kg (203 lb)  BMI 31.79 kg/m2  PHYSICAL EXAM: Pt is alert and oriented, elderly male in NAD HEENT: normal Neck: JVP - normal, carotids delayed, 1+ Lungs: CTA bilaterally CV: RRR with grade 3/6 harsh systolic murmur at the base Abd: soft, NT, Positive BS, no hepatomegaly Ext: no edema, DP pulses 2+= bilaterally, PT pulses faint Skin: ulcer at the dorsum of the right great toe, no surrounding erythema or discharge  EKG:  Sinus brady with 1st degree AVB, nonspecific IVCD, Left axis deviation  ASSESSMENT AND PLAN: 1. Severe symptomatic aortic stenosis. The patient continues to have exertional dyspnea as his primary symptom. Considering his advance age and reduced activity level, he is not too limited and his symptoms have not progressed. However, he was noted to have mild LV dysfunction at the time of his last echo and this should be repeated to evaluate for further deterioration of LV function. Will continue same medical therapy and see him back in 3 months. Will notify him if the Sapien XT valve comes to market before his next visit.  2. CAD, native vessel. Stable without anginal symptoms. He continues on low-dose ASA.  3. Atrial tach. Maintaining sinus rhythm on amio. Needs amio lab surveillance (TSH/LFT's) - will arrange.  For follow-up I'll see him back  in 3 months.   Eugene [name] 02/21/2013 3:17 PM

## 2013-02-24 ENCOUNTER — Ambulatory Visit: Payer: Medicare Other

## 2013-02-26 ENCOUNTER — Encounter: Payer: Self-pay | Admitting: Internal Medicine

## 2013-02-26 ENCOUNTER — Ambulatory Visit (INDEPENDENT_AMBULATORY_CARE_PROVIDER_SITE_OTHER): Payer: Medicare Other | Admitting: Internal Medicine

## 2013-02-26 VITALS — BP 153/83 | HR 63 | Temp 97.0°F | Ht 64.4 in | Wt 202.6 lb

## 2013-02-26 DIAGNOSIS — I10 Essential (primary) hypertension: Secondary | ICD-10-CM

## 2013-02-26 MED ORDER — AMLODIPINE BESYLATE 5 MG PO TABS: ORAL_TABLET | ORAL | Status: DC

## 2013-02-26 NOTE — Progress Notes (Signed)
  Subjective:    Patient ID: Eugene [name], male    DOB: 1924-09-27, 79 y.o.   MRN: [id number]  HPI He's doing well. He was having some difficulty with his grandchildren taking advantage of his home situation. He presents with his pastor who is helping him resolve that situation.  His pastor states that he has had some ulcers on his toes, followed by his podiatrist Dr. Brynda Greathouse at [facility].  He has been wearing leather shoed and these have been changed.  He states he's been having some bleeding on his left cheek when he shaves. He has been attending physical therapy.  He is attending 1-2 times a week. No more falls since last visit. He brings me a BP log today.    Review of Systems Complete 12 point ROS otherwise negative except for that stated in the HPI.    Objective:   Physical Exam Filed Vitals:   02/26/13 0841  BP: 153/83  Pulse: 63  Temp: 97 F (36.1 C)   GEN: AAOx3, NAD HEENT: EOMI, PERRL, no icterus, no adenopathy. CV: S1S2, no m/r/g, RRR, distant heart sounds. PULM: CTA bilat ABD/GI: Soft, NT, +BS, no guarding, no distention. LE/UE: 1x1 cm clean based ulcer on right superior toe, 0.5x0.5 cm clean based ulcer on distal left toe. 2/4 pulses. NEURO: CN II-XII intact, no definite focal deficits.      Assessment & Plan:  79 yr. Old male w/ hx severe symptomatic AS followed by cardiology, CAD w/ severe LAD stenosis s/p BMS placement, HL, ISCM Chronic systolic heart failure, P Afib, presents for follow up.  1) Fall: Attending PT, uses a walker no more falls. 2) Glucose intolerance: Diet controlled, no need for therapy. 3) Pressure ulcers: No more leather shoes. Agree with current open toe shoes. Follow with podiatry. 4) HTN: Increase amlodipine to 5 mg daily. 5) HL: On pravachol. 6) Health Maintenance: Flu, DTap, Pneumovax UTD. Return in three months.

## 2013-02-26 NOTE — Patient Instructions (Addendum)
Increase amlodipine to 5 mg daily. Continue to monitor his BP at home. Return in 3 months. No blood work today.

## 2013-03-06 ENCOUNTER — Other Ambulatory Visit: Payer: Medicare Other

## 2013-03-06 ENCOUNTER — Other Ambulatory Visit (HOSPITAL_COMMUNITY): Payer: Medicare Other

## 2013-03-09 ENCOUNTER — Telehealth: Payer: Self-pay | Admitting: *Deleted

## 2013-03-09 NOTE — Telephone Encounter (Signed)
Patient should go to the ER.  He is 65 with serious illnesses and his BP is much lower than usual.

## 2013-03-09 NOTE — Telephone Encounter (Signed)
Pt calls and states he has recently been feeling tired and weak more so today, states his BP yesterday 114/63 hr 48, today 98/62 hr 48, having diarrhea 3-4 times daily since 3 days ago. Starting to feel bad. Please advise

## 2013-03-09 NOTE — Telephone Encounter (Signed)
Pt is getting ride now

## 2013-03-10 ENCOUNTER — Ambulatory Visit: Payer: Medicare Other | Admitting: Physical Therapy

## 2013-03-10 ENCOUNTER — Telehealth: Payer: Self-pay | Admitting: *Deleted

## 2013-03-10 NOTE — Telephone Encounter (Signed)
i called pt to f/u on advisement to ED yesterday, he chose not to go and states that today he is much better and will f/u as needed

## 2013-03-10 NOTE — Telephone Encounter (Signed)
Called pt and got vmail, left a message for him to call for f/u appt, will call him again tomorrow

## 2013-03-10 NOTE — Telephone Encounter (Signed)
Advise a follow up appointment with [place] this week or go to the ED as he was informed.

## 2013-03-11 NOTE — Telephone Encounter (Signed)
appt 6/5 dr brown

## 2013-03-11 NOTE — Telephone Encounter (Signed)
appt 6/5 dr brown 

## 2013-03-12 ENCOUNTER — Ambulatory Visit (INDEPENDENT_AMBULATORY_CARE_PROVIDER_SITE_OTHER): Payer: Medicare Other | Admitting: Internal Medicine

## 2013-03-12 ENCOUNTER — Encounter: Payer: Self-pay | Admitting: Internal Medicine

## 2013-03-12 VITALS — BP 122/70 | HR 84 | Temp 98.1°F | Resp 20 | Ht 64.5 in | Wt 203.0 lb

## 2013-03-12 DIAGNOSIS — R197 Diarrhea, unspecified: Secondary | ICD-10-CM

## 2013-03-12 DIAGNOSIS — I4891 Unspecified atrial fibrillation: Secondary | ICD-10-CM

## 2013-03-12 NOTE — Patient Instructions (Addendum)
General Instructions: Your diarrhea is most likely being caused by your use of Docusate, the stool softener.  Stop taking Docusate at this time.  Once your bowel movements return to normal, and you are having no more than 1 bowel movement per day, you may restart Docusate, 1 tablet per day.  If you again have loose stools, you may need to decrease this to 1 tablet every other day.  Because of these loose bowel movements, you may be mildly dehydrated.  Try to drink extra water over the next few days.  We are checking some basic labs today, and will call you if the results are abnormal.  Please keep your existing follow-up appointment with our office.   Treatment Goals:  Goals (1 Years of Data) as of 03/12/13         As of Today 02/26/13 02/20/13 11/27/12 11/07/12     Lifestyle    . Family to eat at dinner table more often           Weight    . Weight < 200 lb (90.719 kg)  203 lb (92.08 kg) 202 lb 9.6 oz (91.899 kg) 203 lb (92.08 kg) 211 lb 11.2 oz (96.026 kg) 203 lb (92.08 kg)      Progress Toward Treatment Goals:  Treatment Goal 03/12/2013  Blood pressure at goal  Prevent falls unchanged    Self Care Goals & Plans:  Self Care Goal 03/12/2013  Manage my medications take my medicines as prescribed; bring my medications to every visit; refill my medications on time  Monitor my health keep track of my blood pressure  Eat healthy foods eat foods that are low in salt; drink diet soda or water instead of juice or soda  Be physically active take a walk every day  Prevent falls use home fall prevention checklist to improve safety; wear appropriate shoes       Care Management & Community Referrals:  Referral 03/12/2013  Referrals made for care management support none needed  Referrals made to community resources -

## 2013-03-12 NOTE — Assessment & Plan Note (Addendum)
The patient notes a 4-day history of loose stools.  The etiology of this is likely due to overuse of docusate, +/- milk of magnesia.  Symptoms are improving since decreasing docusate usage.  The patient notes no blood/melena in stool, and no risk factors for C Diff vs bacterial diarrhea.  The patient noted asymptomatic hypotension at home, but BP is well-controlled today, and orthostatic vitals show no change in BP, though elevation in HR from 58-84, possibly representing mild volume depletion -stop docusate usage altogether.  May resume at 1 tablet every 1-2 days once bowel movements return to normal (no more than 1 BM/day) -d/c milk of magnesia -if symptoms do not improve, consider sending stool for c diff -will send BMET today, to evaluate BUN/Cr -encouraged patient to drink extra water for the next few days.  Patient can continue diuretics (lasix, spironolactone)  Addendum:  BMET shows elevated creatinine, likely representing AKI from pre-renal azotemia due to volume depletion from diarrhea.  Plan per above to stop diarrhea, and increase water intake.

## 2013-03-12 NOTE — Progress Notes (Signed)
HPI The patient is a 79 y.o. male with a history of HTN, HL, CAD, aortic stenosis, presenting for an acute visit.  The patient notes a 4-day history of diarrhea and hypotension (as low as 98/62, with a HR of 48, per patient report).  The patient describes the diarrhea as 3-4 loose stools per day, while taking docusate 100 mg BID (of note, milk of magnesia was also on the patient's problem list, but he denies taking this).  The patient's pastor was concerned, and decreased docusate to 100 mg daily 2 days ago.  The patient now notes solid formed stools, though still 2-3 per day.  He notes no blood or melena in his bowel movements.  No fever.  No abdominal pain, nausea, or vomiting.  Normal PO intake, which his pastor describes as "small".  The patient notes no recent contact with the healthcare system (other than a podiatry appointment after symptoms started), or antibiotic usage within the last month.  ROS: General: no fevers, chills, changes in weight, changes in appetite Skin: no rash HEENT: no blurry vision, hearing changes, sore throat Pulm: no dyspnea, coughing, wheezing CV: no chest pain, palpitations, shortness of breath Abd: no abdominal pain, nausea/vomiting GU: no dysuria, hematuria, polyuria Ext: no arthralgias, myalgias Neuro: no weakness, numbness, or tingling  Filed Vitals:   03/12/13 1356  BP: 122/70  Pulse: 84  Temp:   Resp:     Orthostatic Vital Signs Sitting: BP 118/69,  HR 58 Standing: BP 122/70,  HR 84  PEX General: alert, cooperative, and in no apparent distress HEENT: pupils equal round and reactive to light, vision grossly intact, oropharynx clear and non-erythematous, mucous membranes moist Neck: supple, no lymphadenopathy Lungs: clear to ascultation bilaterally, normal work of respiration, no wheezes, rales, ronchi Heart: regular rate and rhythm, no murmurs, gallops, or rubs Abdomen: soft, non-tender, non-distended, normal to mildly hyperactive bowel sounds,  no guarding or rebound tenderness Extremities: no cyanosis, clubbing, or edema Neurologic: alert & oriented X3, cranial nerves II-XII intact, strength grossly intact, sensation intact to light touch  Current Outpatient Prescriptions on File Prior to Visit  Medication Sig Dispense Refill  . amiodarone (PACERONE) 200 MG tablet TAKE 1 TABLET EVERY DAY  30 tablet  5  . amLODipine (NORVASC) 5 MG tablet TAKE 1 TABLET EVERY DAY  30 tablet  1  . aspirin 81 MG tablet Take 81 mg by mouth daily.        . Calcium Carbonate-Vitamin D (CALCIUM + D PO) Take 1 tablet by mouth daily.       . carvedilol (COREG) 6.25 MG tablet Take 1 tablet (6.25 mg total) by mouth 2 (two) times daily.  60 tablet  11  . docusate sodium (COLACE) 100 MG capsule Take 100 mg by mouth 2 (two) times daily.       . ferrous sulfate 325 (65 FE) MG tablet Take 325 mg by mouth daily with breakfast.      . furosemide (LASIX) 40 MG tablet TAKE 1 TABLET EVERY MORNING  30 tablet  5  . levothyroxine (LEVOTHROID) 25 MCG tablet Take 1 tablet (25 mcg total) by mouth daily.  30 tablet  3  . losartan (COZAAR) 50 MG tablet TAKE 1 TABLET (50 MG TOTAL) BY MOUTH DAILY.  30 tablet  1  . loteprednol (LOTEMAX) 0.5 % ophthalmic suspension Place 1 drop into the right eye every morning.       . magnesium hydroxide (MILK OF MAGNESIA) 400 MG/5ML suspension Take 30 mLs  by mouth daily as needed. For constipation      . pantoprazole (PROTONIX) 40 MG tablet TAKE 1 TABLET BY MOUTH EVERY DAY  30 tablet  2  . pravastatin (PRAVACHOL) 40 MG tablet TAKE 1 TABLET (40 MG TOTAL) BY MOUTH EVERY EVENING.  30 tablet  5  . spironolactone (ALDACTONE) 25 MG tablet TAKE 1/2 TABLET EVERY MORNING  15 tablet  12  . travoprost, benzalkonium, (TRAVATAN) 0.004 % ophthalmic solution Place 1 drop into the right eye at bedtime.         No current facility-administered medications on file prior to visit.    Assessment/Plan

## 2013-03-13 ENCOUNTER — Ambulatory Visit (INDEPENDENT_AMBULATORY_CARE_PROVIDER_SITE_OTHER): Payer: Medicare Other | Admitting: *Deleted

## 2013-03-13 ENCOUNTER — Ambulatory Visit (HOSPITAL_COMMUNITY): Payer: Medicare Other | Attending: Cardiovascular Disease | Admitting: Radiology

## 2013-03-13 DIAGNOSIS — E669 Obesity, unspecified: Secondary | ICD-10-CM | POA: Insufficient documentation

## 2013-03-13 DIAGNOSIS — I359 Nonrheumatic aortic valve disorder, unspecified: Secondary | ICD-10-CM

## 2013-03-13 DIAGNOSIS — E785 Hyperlipidemia, unspecified: Secondary | ICD-10-CM

## 2013-03-13 DIAGNOSIS — I2510 Atherosclerotic heart disease of native coronary artery without angina pectoris: Secondary | ICD-10-CM | POA: Insufficient documentation

## 2013-03-13 DIAGNOSIS — I2589 Other forms of chronic ischemic heart disease: Secondary | ICD-10-CM | POA: Insufficient documentation

## 2013-03-13 DIAGNOSIS — I10 Essential (primary) hypertension: Secondary | ICD-10-CM

## 2013-03-13 DIAGNOSIS — I872 Venous insufficiency (chronic) (peripheral): Secondary | ICD-10-CM

## 2013-03-13 DIAGNOSIS — I4891 Unspecified atrial fibrillation: Secondary | ICD-10-CM | POA: Insufficient documentation

## 2013-03-13 DIAGNOSIS — I509 Heart failure, unspecified: Secondary | ICD-10-CM | POA: Insufficient documentation

## 2013-03-13 LAB — HEPATIC FUNCTION PANEL
AST: 12 U/L (ref 0–37)
Albumin: 3.4 g/dL — ABNORMAL LOW (ref 3.5–5.2)
Alkaline Phosphatase: 76 U/L (ref 39–117)
Alkaline Phosphatase: 70 U/L (ref 39–117)
Bilirubin, Direct: 0.1 mg/dL (ref 0.0–0.3)
Indirect Bilirubin: 0.3 mg/dL (ref 0.0–0.9)
Total Bilirubin: 0.4 mg/dL (ref 0.3–1.2)
Total Protein: 6.2 g/dL (ref 6.0–8.3)

## 2013-03-13 LAB — BASIC METABOLIC PANEL WITH GFR
CO2: 30 mEq/L (ref 19–32)
Chloride: 102 mEq/L (ref 96–112)
Potassium: 4.9 mEq/L (ref 3.5–5.3)
Sodium: 140 mEq/L (ref 135–145)

## 2013-03-13 LAB — BASIC METABOLIC PANEL
BUN: 34 mg/dL — ABNORMAL HIGH (ref 6–23)
CO2: 29 mEq/L (ref 19–32)
Calcium: 9.2 mg/dL (ref 8.4–10.5)
Creatinine, Ser: 1.6 mg/dL — ABNORMAL HIGH (ref 0.4–1.5)
Glucose, Bld: 91 mg/dL (ref 70–99)
Sodium: 137 mEq/L (ref 135–145)

## 2013-03-13 NOTE — Progress Notes (Signed)
Echocardiogram performed.  

## 2013-03-17 ENCOUNTER — Ambulatory Visit: Payer: Medicare Other | Attending: Internal Medicine | Admitting: Physical Therapy

## 2013-03-17 ENCOUNTER — Ambulatory Visit: Payer: Medicare Other

## 2013-03-17 DIAGNOSIS — R269 Unspecified abnormalities of gait and mobility: Secondary | ICD-10-CM | POA: Insufficient documentation

## 2013-03-17 DIAGNOSIS — IMO0001 Reserved for inherently not codable concepts without codable children: Secondary | ICD-10-CM | POA: Insufficient documentation

## 2013-03-17 DIAGNOSIS — M6281 Muscle weakness (generalized): Secondary | ICD-10-CM | POA: Insufficient documentation

## 2013-03-23 ENCOUNTER — Ambulatory Visit: Payer: Medicare Other

## 2013-03-24 ENCOUNTER — Encounter: Payer: Self-pay | Admitting: Vascular Surgery

## 2013-03-24 ENCOUNTER — Other Ambulatory Visit: Payer: Self-pay | Admitting: *Deleted

## 2013-03-24 DIAGNOSIS — L97909 Non-pressure chronic ulcer of unspecified part of unspecified lower leg with unspecified severity: Secondary | ICD-10-CM

## 2013-03-25 ENCOUNTER — Ambulatory Visit (INDEPENDENT_AMBULATORY_CARE_PROVIDER_SITE_OTHER): Payer: Medicare Other | Admitting: Vascular Surgery

## 2013-03-25 ENCOUNTER — Encounter (INDEPENDENT_AMBULATORY_CARE_PROVIDER_SITE_OTHER): Payer: Medicare Other | Admitting: *Deleted

## 2013-03-25 ENCOUNTER — Encounter: Payer: Self-pay | Admitting: Vascular Surgery

## 2013-03-25 VITALS — BP 144/69 | HR 57 | Resp 16 | Ht 67.0 in | Wt 202.0 lb

## 2013-03-25 DIAGNOSIS — L98499 Non-pressure chronic ulcer of skin of other sites with unspecified severity: Secondary | ICD-10-CM

## 2013-03-25 DIAGNOSIS — L97509 Non-pressure chronic ulcer of other part of unspecified foot with unspecified severity: Secondary | ICD-10-CM

## 2013-03-25 DIAGNOSIS — I739 Peripheral vascular disease, unspecified: Secondary | ICD-10-CM

## 2013-03-25 DIAGNOSIS — M79609 Pain in unspecified limb: Secondary | ICD-10-CM | POA: Insufficient documentation

## 2013-03-25 DIAGNOSIS — L97909 Non-pressure chronic ulcer of unspecified part of unspecified lower leg with unspecified severity: Secondary | ICD-10-CM

## 2013-03-25 NOTE — Assessment & Plan Note (Signed)
Patient has a nonhealing wound on the tip of his left great toe. 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 Given the nonhealing wound with monophasic flow in the left foot this could certainly become a limb threatening situation. This reason, I have recommended we proceed with arteriography. If he has disease amenable to angioplasty this could potentially be addressed at the same time. If he would require extensive bypass for revascularization and he would be at high risk for this given his age and medical comorbidities. If the arteriogram shows that he has adequate circulation for healing, and the toe wound fails to improve then we will have confidence that he will heal a left great toe amputation. I have reviewed with the patient the indications for arteriography. In addition, I have reviewed the potential complications of arteriography including but not limited to: Bleeding, arterial injury, arterial thrombosis, dye action, renal insufficiency, or other unpredictable medical problems. I have explained to the patient that if we find disease amenable to angioplasty we could potentially address this at the same time. I have discussed the potential complications of angioplasty and stenting, including but not limited to: Bleeding, arterial thrombosis, arterial injury, dissection, or the need for surgical intervention. Make further recommendations pending the results of his arteriogram which is scheduled for 03/30/2013.

## 2013-03-25 NOTE — Progress Notes (Signed)
[facility] [place]  Patient name: Eugene [name] MRN: [id number] DOB: 02/16/1924 Sex: male  REASON FOR CONSULT: nonhealing wounds of both feet. For by Dr. Petery  HPI: Eugene [name] is a 79 y.o. male well-developed wounds on both feet approximately 4 months ago after he was wearing shoes that were too tight for him. He has been followed by Dr. Petery and has shown some improvement however given the slow progress he was sent for vascular consultation. He denies any history of significant claudication all the way think his activity is very limited because of his age. He denies any history of rest pain. He does state that he lives at home and is ambulatory with a walker. He denies any history of diabetes. He does not smoke.  Past Medical History  Diagnosis Date  . Glaucoma     Right eye  . Diverticulosis     multiple hospital admissions for GI bleed, has an episode roughly every 6 months,, last  colonscopy in 2006 showed diverticulosis, was evaluated by Dr. Hayes in the hospital in 2011 for lower GI bleed andd it  was suggested that the patient was not actively bleeding at that time and given his comorbid conditions colonoscopy was deferred. He is also chronically constipated  . Macular degeneration of right eye   . Allergic rhinitis   . CAD (coronary artery disease)     s/p PCI in 1991;  Cardiac catheterization 11/02/11: Ostial LAD 95% with aneurysmal dilatation after this, proximal circumflex 70% with a superior branch 30-40%, proximal RCA 75% and 90%, mid RCA 50% and 90% before a PDA, then occluded.;    high risk for CABG;PCI 11/14/11: Bare-metal stent to the proximal LAD    . OSA on CPAP   . Hypertension   . Hyperlipidemia   . Chronic systolic heart failure     Echocardiogram 11/02/11: Moderate LVH, EF 30-35%, multiple wall motion abnormalities, critical aortic stenosis, AVA 0.6, mean gradient 45, mild MR, severe LAE  . Supraventricular tachycardia     2  syndromes-nonsustained atrial tachycardia//adenosine responsive diuretic positive reentry probably AV node reentry  . Aortic stenosis     s/p AV balloon valvuloplasty by Dr. Cooper 11/2011  . Ischemic cardiomyopathy   . H/O: GI bleed     recurrent  . Heart murmur   . CHF (congestive heart failure)   . Myocardial infarction    Family History  Problem Relation Age of Onset  . Cancer Mother   . Cancer Father   . Heart disease Sister     Heart disease before age 60   For VQI Use Only  PRE-ADM LIVING: [X ] Home, [ ] Nursing home, [ ] Homeless  AMB STATUS: [ ] Walking, [X ] Walking w/ Assistance, [ ] Wheelchair, [ ]Bed ridden  RECENT HEART ATTACK (<6 mon): No  CAD Sx: [X ] No, [ ] Asx, h/o MI, [ ] Stable angina, [ ] Unstable angina  PRIOR CHF: [X ] No, [ ] Asx, [ ] Mild, [ ] Moderate, [ ] Severe  STRESS TEST: [X ] No, [ ] Normal, [ ] + ischemia, [ ] + MI, [ ] Both  SOCIAL HISTORY: History  Substance Use Topics  . Smoking status: Former Smoker    Types: Cigarettes    Quit date: 10/08/1960  . Smokeless tobacco: Never Used  . Alcohol Use: No    Allergies  Allergen Reactions  . Penicillins Swelling    REACTION: "turns red"      Current Outpatient Prescriptions  Medication Sig Dispense Refill  . amiodarone (PACERONE) 200 MG tablet TAKE 1 TABLET EVERY DAY  30 tablet  5  . amLODipine (NORVASC) 5 MG tablet TAKE 1 TABLET EVERY DAY  30 tablet  1  . aspirin 81 MG tablet Take 81 mg by mouth daily.        . Calcium Carbonate-Vitamin D (CALCIUM + D PO) Take 1 tablet by mouth daily.       . carvedilol (COREG) 6.25 MG tablet Take 1 tablet (6.25 mg total) by mouth 2 (two) times daily.  60 tablet  11  . docusate sodium (COLACE) 100 MG capsule Take 100 mg by mouth 2 (two) times daily.       . ferrous sulfate 325 (65 FE) MG tablet Take 325 mg by mouth daily with breakfast.      . furosemide (LASIX) 40 MG tablet TAKE 1 TABLET EVERY MORNING  30 tablet  5  . levothyroxine (LEVOTHROID) 25 MCG  tablet Take 1 tablet (25 mcg total) by mouth daily.  30 tablet  3  . losartan (COZAAR) 50 MG tablet TAKE 1 TABLET (50 MG TOTAL) BY MOUTH DAILY.  30 tablet  1  . loteprednol (LOTEMAX) 0.5 % ophthalmic suspension Place 1 drop into the right eye every morning.       . magnesium hydroxide (MILK OF MAGNESIA) 400 MG/5ML suspension Take 30 mLs by mouth daily as needed. For constipation      . pantoprazole (PROTONIX) 40 MG tablet TAKE 1 TABLET BY MOUTH EVERY DAY  30 tablet  2  . pravastatin (PRAVACHOL) 40 MG tablet TAKE 1 TABLET (40 MG TOTAL) BY MOUTH EVERY EVENING.  30 tablet  5  . spironolactone (ALDACTONE) 25 MG tablet TAKE 1/2 TABLET EVERY MORNING  15 tablet  12  . travoprost, benzalkonium, (TRAVATAN) 0.004 % ophthalmic solution Place 1 drop into the right eye at bedtime.         No current facility-administered medications for this visit.    REVIEW OF SYSTEMS: [X ] denotes positive finding; [  ] denotes negative finding  CARDIOVASCULAR:  [ ] chest pain   [ ] chest pressure   [ ] palpitations   [ ] orthopnea   [X ] dyspnea on exertion   [ ] claudication   [ ] rest pain   [ ] DVT   [ ] phlebitis PULMONARY:   [ ] productive cough   [ ] asthma   [ ] wheezing NEUROLOGIC:   [ ] weakness  [ ] paresthesias  [ ] aphasia  [ ] amaurosis  [ ] dizziness HEMATOLOGIC:   [ ] bleeding problems   [ ] clotting disorders MUSCULOSKELETAL:  [ ] joint pain   [ ] joint swelling [X ] leg swelling GASTROINTESTINAL: [ ]  blood in stool  [ ]  hematemesis GENITOURINARY:  [ ]  dysuria  [ ]  hematuria PSYCHIATRIC:  [ ] history of major depression INTEGUMENTARY:  [ ] rashes  [ ] ulcers CONSTITUTIONAL:  [ ] fever   [ ] chills  PHYSICAL EXAM: Filed Vitals:   03/25/13 1242  BP: 144/69  Pulse: 57  Resp: 16  Height: 5' 7" (1.702 m)  Weight: 202 lb (91.627 kg)  SpO2: 97%   Body mass index is 31.63 kg/(m^2). GENERAL: The patient is a well-nourished male, in no acute distress. The vital signs are documented  above. CARDIOVASCULAR: There is a regular rate and rhythm. Do not detect   carotid bruits. He has palpable femoral pulses. On the left side he has a palpable popliteal pulse. I cannot palpate pedal pulses on the left or the right. I cannot palpate a popliteal pulse on the right. He has mild bilateral lower extremity swelling PULMONARY: There is good air exchange bilaterally without wheezing or rales. ABDOMEN: Soft and non-tender with normal pitched bowel sounds. I do not appreciate an abdominal aortic aneurysm although he is obese and it is difficult to assess his abdomen.  MUSCULOSKELETAL: There are no major deformities or cyanosis. NEUROLOGIC: No focal weakness or paresthesias are detected. SKIN: there is an open ulcer on the tip of his left great toe with minimal drainage. There is a very small ulcer on the tip of his third toe. PSYCHIATRIC: The patient has a normal affect.  DATA:  I have independently interpreted his duplex of his lower extremities which show [place] sick waveforms in the left common femoral artery and superficial femoral artery proximally. He has monophasic signals in the posterior tibial anterior tibial and peroneal arteries on the left. There is no area of high-grade stenosis identified. On the right side he has triphasic in biphasic waveforms throughout.  I've interpreted his arterial Doppler study which shows an ABI of her percent on the right and 87% on the left although this is likely falsely elevated because of calcific disease.  I have reviewed his records from Dr. Petery's office. He is been following he slowly healing wounds on his feet.  MEDICAL ISSUES:  Atherosclerotic PVD with ulceration Patient has a nonhealing wound on the tip of his left great toe. 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 Given the nonhealing wound with monophasic flow in the left foot this could certainly become a limb threatening situation. This  reason, I have recommended we proceed with arteriography. If he has disease amenable to angioplasty this could potentially be addressed at the same time. If he would require extensive bypass for revascularization and he would be at high risk for this given his age and medical comorbidities. If the arteriogram shows that he has adequate circulation for healing, and the toe wound fails to improve then we will have confidence that he will heal a left great toe amputation. I have reviewed with the patient the indications for arteriography. In addition, I have reviewed the potential complications of arteriography including but not limited to: Bleeding, arterial injury, arterial thrombosis, dye action, renal insufficiency, or other unpredictable medical problems. I have explained to the patient that if we find disease amenable to angioplasty we could potentially address this at the same time. I have discussed the potential complications of angioplasty and stenting, including but not limited to: Bleeding, arterial thrombosis, arterial injury, dissection, or the need for surgical intervention. Make further recommendations pending the results of his arteriogram which is scheduled for 03/30/2013.    DICKSON,CHRISTOPHER S [facility] [place] Beeper: [phone]    

## 2013-03-26 ENCOUNTER — Encounter (HOSPITAL_COMMUNITY): Payer: Self-pay | Admitting: Pharmacy Technician

## 2013-03-26 ENCOUNTER — Other Ambulatory Visit: Payer: Self-pay

## 2013-03-30 ENCOUNTER — Other Ambulatory Visit: Payer: Self-pay | Admitting: *Deleted

## 2013-03-30 ENCOUNTER — Telehealth: Payer: Self-pay | Admitting: Vascular Surgery

## 2013-03-30 ENCOUNTER — Encounter (HOSPITAL_COMMUNITY)
Admission: RE | Disposition: A | Discharge status: Skilled Nursing Facility | Payer: Self-pay | Source: Ambulatory Visit | Attending: Vascular Surgery

## 2013-03-30 ENCOUNTER — Encounter (HOSPITAL_COMMUNITY): Payer: Self-pay | Admitting: Anesthesiology

## 2013-03-30 ENCOUNTER — Inpatient Hospital Stay (HOSPITAL_COMMUNITY)
Admission: RE | Admit: 2013-03-30 | Discharge: 2013-04-02 | DRG: 253 | Disposition: A | Discharge status: Skilled Nursing Facility | Payer: Medicare Other | Source: Ambulatory Visit | Attending: Vascular Surgery | Admitting: Vascular Surgery

## 2013-03-30 DIAGNOSIS — I252 Old myocardial infarction: Secondary | ICD-10-CM

## 2013-03-30 DIAGNOSIS — L97509 Non-pressure chronic ulcer of other part of unspecified foot with unspecified severity: Secondary | ICD-10-CM | POA: Diagnosis present

## 2013-03-30 DIAGNOSIS — Z87891 Personal history of nicotine dependence: Secondary | ICD-10-CM

## 2013-03-30 DIAGNOSIS — Z79899 Other long term (current) drug therapy: Secondary | ICD-10-CM

## 2013-03-30 DIAGNOSIS — L98499 Non-pressure chronic ulcer of skin of other sites with unspecified severity: Principal | ICD-10-CM | POA: Diagnosis present

## 2013-03-30 DIAGNOSIS — I739 Peripheral vascular disease, unspecified: Secondary | ICD-10-CM

## 2013-03-30 DIAGNOSIS — K219 Gastro-esophageal reflux disease without esophagitis: Secondary | ICD-10-CM | POA: Diagnosis present

## 2013-03-30 DIAGNOSIS — E669 Obesity, unspecified: Secondary | ICD-10-CM | POA: Diagnosis present

## 2013-03-30 DIAGNOSIS — Y9210 Unspecified residential institution as the place of occurrence of the external cause: Secondary | ICD-10-CM | POA: Diagnosis not present

## 2013-03-30 DIAGNOSIS — E039 Hypothyroidism, unspecified: Secondary | ICD-10-CM | POA: Diagnosis present

## 2013-03-30 DIAGNOSIS — I5022 Chronic systolic (congestive) heart failure: Secondary | ICD-10-CM | POA: Diagnosis present

## 2013-03-30 DIAGNOSIS — I10 Essential (primary) hypertension: Secondary | ICD-10-CM | POA: Diagnosis present

## 2013-03-30 DIAGNOSIS — I701 Atherosclerosis of renal artery: Secondary | ICD-10-CM | POA: Diagnosis present

## 2013-03-30 DIAGNOSIS — IMO0002 Reserved for concepts with insufficient information to code with codable children: Secondary | ICD-10-CM | POA: Diagnosis not present

## 2013-03-30 DIAGNOSIS — Z9861 Coronary angioplasty status: Secondary | ICD-10-CM

## 2013-03-30 DIAGNOSIS — G4733 Obstructive sleep apnea (adult) (pediatric): Secondary | ICD-10-CM | POA: Diagnosis present

## 2013-03-30 DIAGNOSIS — M129 Arthropathy, unspecified: Secondary | ICD-10-CM | POA: Diagnosis present

## 2013-03-30 DIAGNOSIS — I2589 Other forms of chronic ischemic heart disease: Secondary | ICD-10-CM | POA: Diagnosis present

## 2013-03-30 DIAGNOSIS — Z7982 Long term (current) use of aspirin: Secondary | ICD-10-CM

## 2013-03-30 DIAGNOSIS — H409 Unspecified glaucoma: Secondary | ICD-10-CM | POA: Diagnosis present

## 2013-03-30 DIAGNOSIS — E785 Hyperlipidemia, unspecified: Secondary | ICD-10-CM | POA: Diagnosis present

## 2013-03-30 DIAGNOSIS — I2510 Atherosclerotic heart disease of native coronary artery without angina pectoris: Secondary | ICD-10-CM | POA: Diagnosis present

## 2013-03-30 DIAGNOSIS — I509 Heart failure, unspecified: Secondary | ICD-10-CM | POA: Diagnosis present

## 2013-03-30 DIAGNOSIS — Y842 Radiological procedure and radiotherapy as the cause of abnormal reaction of the patient, or of later complication, without mention of misadventure at the time of the procedure: Secondary | ICD-10-CM | POA: Diagnosis not present

## 2013-03-30 HISTORY — PX: ABDOMINAL AORTAGRAM: SHX5454

## 2013-03-30 HISTORY — DX: Gastro-esophageal reflux disease without esophagitis: K21.9

## 2013-03-30 HISTORY — PX: ANGIOPLASTY: SHX39

## 2013-03-30 HISTORY — DX: Hypothyroidism, unspecified: E03.9

## 2013-03-30 LAB — POCT I-STAT, CHEM 8
Creatinine, Ser: 1.5 mg/dL — ABNORMAL HIGH (ref 0.50–1.35)
HCT: 39 % (ref 39.0–52.0)
Hemoglobin: 13.3 g/dL (ref 13.0–17.0)
Potassium: 4.4 mEq/L (ref 3.5–5.1)
Sodium: 139 mEq/L (ref 135–145)
TCO2: 25 mmol/L (ref 0–100)

## 2013-03-30 LAB — COMPREHENSIVE METABOLIC PANEL
ALT: 7 U/L (ref 0–53)
BUN: 28 mg/dL — ABNORMAL HIGH (ref 6–23)
Calcium: 7.9 mg/dL — ABNORMAL LOW (ref 8.4–10.5)
GFR calc Af Amer: 65 mL/min — ABNORMAL LOW (ref >90)
Glucose, Bld: 129 mg/dL — ABNORMAL HIGH (ref 70–99)
Sodium: 137 mEq/L (ref 135–145)
Total Protein: 4.8 g/dL — ABNORMAL LOW (ref 6.0–8.3)

## 2013-03-30 LAB — URINALYSIS, ROUTINE W REFLEX MICROSCOPIC
Leukocytes, UA: NEGATIVE
Nitrite: NEGATIVE
Specific Gravity, Urine: 1.028 (ref 1.005–1.030)
pH: 7.5 (ref 5.0–8.0)

## 2013-03-30 LAB — CBC
Hemoglobin: 11.7 g/dL — ABNORMAL LOW (ref 13.0–17.0)
MCH: 31.5 pg (ref 26.0–34.0)
MCHC: 33.6 g/dL (ref 30.0–36.0)

## 2013-03-30 LAB — PROTIME-INR: Prothrombin Time: 14.1 seconds (ref 11.6–15.2)

## 2013-03-30 LAB — POCT ACTIVATED CLOTTING TIME: Activated Clotting Time: 206 seconds

## 2013-03-30 SURGERY — EVACUATION HEMATOMA
Anesthesia: General | Laterality: Right

## 2013-03-30 SURGERY — ABDOMINAL AORTAGRAM
Anesthesia: LOCAL

## 2013-03-30 MED ORDER — ONDANSETRON HCL 4 MG/2ML IJ SOLN: 4.0000 mg | Freq: Four times a day (QID) | INTRAMUSCULAR | Status: DC | PRN

## 2013-03-30 MED ORDER — GUAIFENESIN-DM 100-10 MG/5ML PO SYRP: 15.0000 mL | ORAL_SOLUTION | ORAL | Status: DC | PRN

## 2013-03-30 MED ORDER — SODIUM CHLORIDE 0.9 % IV SOLN: 1.0000 mL/kg/h | INTRAVENOUS | Status: DC

## 2013-03-30 MED ORDER — SODIUM CHLORIDE 0.9 % IV SOLN: INTRAVENOUS | Status: DC

## 2013-03-30 MED ORDER — ALUM & MAG HYDROXIDE-SIMETH 200-200-20 MG/5ML PO SUSP: 15.0000 mL | ORAL | Status: DC | PRN

## 2013-03-30 MED ORDER — ATROPINE SULFATE 1 MG/ML IJ SOLN
INTRAMUSCULAR | Status: AC
  Filled 2013-03-30: qty 1

## 2013-03-30 MED ORDER — METOPROLOL TARTRATE 1 MG/ML IV SOLN: 2.0000 mg | INTRAVENOUS | Status: DC | PRN

## 2013-03-30 MED ORDER — PANTOPRAZOLE SODIUM 40 MG PO TBEC
40.0000 mg | DELAYED_RELEASE_TABLET | Freq: Every day | ORAL | Status: DC
  Administered 2013-03-30 – 2013-04-02 (×4): 40 mg via ORAL
  Filled 2013-03-30 (×4): qty 1

## 2013-03-30 MED ORDER — KCL IN DEXTROSE-NACL 20-5-0.45 MEQ/L-%-% IV SOLN
INTRAVENOUS | Status: DC
  Administered 2013-03-30: 16:00:00 via INTRAVENOUS
  Filled 2013-03-30 (×2): qty 1000

## 2013-03-30 MED ORDER — MIDAZOLAM HCL 2 MG/2ML IJ SOLN
INTRAMUSCULAR | Status: AC
  Filled 2013-03-30: qty 2

## 2013-03-30 MED ORDER — ACETAMINOPHEN 325 MG PO TABS: 650.0000 mg | ORAL_TABLET | ORAL | Status: DC | PRN

## 2013-03-30 MED ORDER — LIDOCAINE HCL (PF) 1 % IJ SOLN
INTRAMUSCULAR | Status: AC
  Filled 2013-03-30: qty 30

## 2013-03-30 MED ORDER — LABETALOL HCL 5 MG/ML IV SOLN
10.0000 mg | INTRAVENOUS | Status: DC | PRN
  Filled 2013-03-30: qty 4

## 2013-03-30 MED ORDER — POTASSIUM CHLORIDE CRYS ER 20 MEQ PO TBCR: 20.0000 meq | EXTENDED_RELEASE_TABLET | Freq: Once | ORAL | Status: DC

## 2013-03-30 MED ORDER — PHENOL 1.4 % MT LIQD
1.0000 | OROMUCOSAL | Status: DC | PRN
  Filled 2013-03-30: qty 177

## 2013-03-30 MED ORDER — HYDRALAZINE HCL 20 MG/ML IJ SOLN: 10.0000 mg | INTRAMUSCULAR | Status: DC | PRN

## 2013-03-30 MED ORDER — FENTANYL CITRATE 0.05 MG/ML IJ SOLN
INTRAMUSCULAR | Status: AC
  Filled 2013-03-30: qty 2

## 2013-03-30 MED ORDER — MORPHINE SULFATE 4 MG/ML IJ SOLN: 3.0000 mg | INTRAMUSCULAR | Status: DC | PRN

## 2013-03-30 MED ORDER — OXYCODONE-ACETAMINOPHEN 5-325 MG PO TABS: 1.0000 | ORAL_TABLET | ORAL | Status: DC | PRN

## 2013-03-30 MED ORDER — HEPARIN SODIUM (PORCINE) 1000 UNIT/ML IJ SOLN
INTRAMUSCULAR | Status: AC
  Filled 2013-03-30: qty 1

## 2013-03-30 NOTE — H&P (View-Only) (Signed)
[facility] [place]  Patient name: Eugene [name] MRN: [id number] DOB: 1924/05/20 Sex: male  REASON FOR CONSULT: nonhealing wounds of both feet. For by Dr. Elvin So  HPI: Eugene [name] is a 79 y.o. male well-developed wounds on both feet approximately 4 months ago after he was wearing shoes that were too tight for him. He has been followed by Dr. Elvin So and has shown some improvement however given the slow progress he was sent for vascular consultation. He denies any history of significant claudication all the way think his activity is very limited because of his age. He denies any history of rest pain. He does state that he lives at home and is ambulatory with a walker. He denies any history of diabetes. He does not smoke.  Past Medical History  Diagnosis Date  . Glaucoma     Right eye  . Diverticulosis     multiple hospital admissions for GI bleed, has an episode roughly every 6 months,, last  colonscopy in 2006 showed diverticulosis, was evaluated by Dr. Madilyn Fireman in the hospital in 2011 for lower GI bleed andd it  was suggested that the patient was not actively bleeding at that time and given his comorbid conditions colonoscopy was deferred. He is also chronically constipated  . Macular degeneration of right eye   . Allergic rhinitis   . CAD (coronary artery disease)     s/p PCI in 1991;  Cardiac catheterization 11/02/11: Ostial LAD 95% with aneurysmal dilatation after this, proximal circumflex 70% with a superior branch 30-40%, proximal RCA 75% and 90%, mid RCA 50% and 90% before a PDA, then occluded.;    high risk for CABG;PCI 11/14/11: Bare-metal stent to the proximal LAD    . OSA on CPAP   . Hypertension   . Hyperlipidemia   . Chronic systolic heart failure     Echocardiogram 11/02/11: Moderate LVH, EF 30-35%, multiple wall motion abnormalities, critical aortic stenosis, AVA 0.6, mean gradient 45, mild MR, severe LAE  . Supraventricular tachycardia     2  syndromes-nonsustained atrial tachycardia//adenosine responsive diuretic positive reentry probably AV node reentry  . Aortic stenosis     s/p AV balloon valvuloplasty by Dr. Excell Seltzer 11/2011  . Ischemic cardiomyopathy   . H/O: GI bleed     recurrent  . Heart murmur   . CHF (congestive heart failure)   . Myocardial infarction    Family History  Problem Relation Age of Onset  . Cancer Mother   . Cancer Father   . Heart disease Sister     Heart disease before age 58   For VQI Use Only  PRE-ADM LIVING: [facility].Keller ] Home, [ ]  Nursing home, [ ]  Homeless  AMB STATUS: [ ]  Walking, [facility]eller ] Walking w/ Assistance, [ ]  Wheelchair, [ ] Bed ridden  RECENT HEART ATTACK (<6 mon): No  CAD Sx: Arly.Keller ] No, [ ]  Asx, h/o MI, [ ]  Stable angina, [ ]  Unstable angina  PRIOR CHF: Arly.Keller ] No, [ ]  Asx, [ ]  Mild, [ ]  Moderate, [ ]  Severe  STRESS TEST: Arly.Keller ] No, [ ]  Normal, [ ]  + ischemia, [ ]  + MI, [ ]  Both  SOCIAL HISTORY: History  Substance Use Topics  . Smoking status: Former Smoker    Types: Cigarettes    Quit date: 10/08/1960  . Smokeless tobacco: Never Used  . Alcohol Use: No    Allergies  Allergen Reactions  . Penicillins Swelling    REACTION: "turns red"  Current Outpatient Prescriptions  Medication Sig Dispense Refill  . amiodarone (PACERONE) 200 MG tablet TAKE 1 TABLET EVERY DAY  30 tablet  5  . amLODipine (NORVASC) 5 MG tablet TAKE 1 TABLET EVERY DAY  30 tablet  1  . aspirin 81 MG tablet Take 81 mg by mouth daily.        . Calcium Carbonate-Vitamin D (CALCIUM + D PO) Take 1 tablet by mouth daily.       . carvedilol (COREG) 6.25 MG tablet Take 1 tablet (6.25 mg total) by mouth 2 (two) times daily.  60 tablet  11  . docusate sodium (COLACE) 100 MG capsule Take 100 mg by mouth 2 (two) times daily.       . ferrous sulfate 325 (65 FE) MG tablet Take 325 mg by mouth daily with breakfast.      . furosemide (LASIX) 40 MG tablet TAKE 1 TABLET EVERY MORNING  30 tablet  5  . levothyroxine (LEVOTHROID) 25 MCG  tablet Take 1 tablet (25 mcg total) by mouth daily.  30 tablet  3  . losartan (COZAAR) 50 MG tablet TAKE 1 TABLET (50 MG TOTAL) BY MOUTH DAILY.  30 tablet  1  . loteprednol (LOTEMAX) 0.5 % ophthalmic suspension Place 1 drop into the right eye every morning.       . magnesium hydroxide (MILK OF MAGNESIA) 400 MG/5ML suspension Take 30 mLs by mouth daily as needed. For constipation      . pantoprazole (PROTONIX) 40 MG tablet TAKE 1 TABLET BY MOUTH EVERY DAY  30 tablet  2  . pravastatin (PRAVACHOL) 40 MG tablet TAKE 1 TABLET (40 MG TOTAL) BY MOUTH EVERY EVENING.  30 tablet  5  . spironolactone (ALDACTONE) 25 MG tablet TAKE 1/2 TABLET EVERY MORNING  15 tablet  12  . travoprost, benzalkonium, (TRAVATAN) 0.004 % ophthalmic solution Place 1 drop into the right eye at bedtime.         No current facility-administered medications for this visit.    REVIEW OF SYSTEMS: Arly.Keller ] denotes positive finding; [  ] denotes negative finding  CARDIOVASCULAR:  [ ]  chest pain   [ ]  chest pressure   [ ]  palpitations   [ ]  orthopnea   Arly.Keller ] dyspnea on exertion   [ ]  claudication   [ ]  rest pain   [ ]  DVT   [ ]  phlebitis PULMONARY:   [ ]  productive cough   [ ]  asthma   [ ]  wheezing NEUROLOGIC:   [ ]  weakness  [ ]  paresthesias  [ ]  aphasia  [ ]  amaurosis  [ ]  dizziness HEMATOLOGIC:   [ ]  bleeding problems   [ ]  clotting disorders MUSCULOSKELETAL:  [ ]  joint pain   [ ]  joint swelling Arly.Keller ] leg swelling GASTROINTESTINAL: [ ]   blood in stool  [ ]   hematemesis GENITOURINARY:  [ ]   dysuria  [ ]   hematuria PSYCHIATRIC:  [ ]  history of major depression INTEGUMENTARY:  [ ]  rashes  [ ]  ulcers CONSTITUTIONAL:  [ ]  fever   [ ]  chills  PHYSICAL EXAM: Filed Vitals:   03/25/13 1242  BP: 144/69  Pulse: 57  Resp: 16  Height: 5\' 7"  (1.702 m)  Weight: 202 lb (91.627 kg)  SpO2: 97%   Body mass index is 31.63 kg/(m^2). GENERAL: The patient is a well-nourished male, in no acute distress. The vital signs are documented  above. CARDIOVASCULAR: There is a regular rate and rhythm. Do not detect  carotid bruits. He has palpable femoral pulses. On the left side he has a palpable popliteal pulse. I cannot palpate pedal pulses on the left or the right. I cannot palpate a popliteal pulse on the right. He has mild bilateral lower extremity swelling PULMONARY: There is good air exchange bilaterally without wheezing or rales. ABDOMEN: Soft and non-tender with normal pitched bowel sounds. I do not appreciate an abdominal aortic aneurysm although he is obese and it is difficult to assess his abdomen.  MUSCULOSKELETAL: There are no major deformities or cyanosis. NEUROLOGIC: No focal weakness or paresthesias are detected. SKIN: there is an open ulcer on the tip of his left great toe with minimal drainage. There is a very small ulcer on the tip of his third toe. PSYCHIATRIC: The patient has a normal affect.  DATA:  I have independently interpreted his duplex of his lower extremities which show [place] sick waveforms in the left common femoral artery and superficial femoral artery proximally. He has monophasic signals in the posterior tibial anterior tibial and peroneal arteries on the left. There is no area of high-grade stenosis identified. On the right side he has triphasic in biphasic waveforms throughout.  I've interpreted his arterial Doppler study which shows an ABI of her percent on the right and 87% on the left although this is likely falsely elevated because of calcific disease.  I have reviewed his records from Dr. Dorisann Frames office. He is been following he slowly healing wounds on his feet.  MEDICAL ISSUES:  Atherosclerotic PVD with ulceration Patient has a nonhealing wound on the tip of his left great toe. 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 Given the nonhealing wound with monophasic flow in the left foot this could certainly become a limb threatening situation. This  reason, I have recommended we proceed with arteriography. If he has disease amenable to angioplasty this could potentially be addressed at the same time. If he would require extensive bypass for revascularization and he would be at high risk for this given his age and medical comorbidities. If the arteriogram shows that he has adequate circulation for healing, and the toe wound fails to improve then we will have confidence that he will heal a left great toe amputation. I have reviewed with the patient the indications for arteriography. In addition, I have reviewed the potential complications of arteriography including but not limited to: Bleeding, arterial injury, arterial thrombosis, dye action, renal insufficiency, or other unpredictable medical problems. I have explained to the patient that if we find disease amenable to angioplasty we could potentially address this at the same time. I have discussed the potential complications of angioplasty and stenting, including but not limited to: Bleeding, arterial thrombosis, arterial injury, dissection, or the need for surgical intervention. Make further recommendations pending the results of his arteriogram which is scheduled for 03/30/2013.    Woodard Perrell S [facility] [place] Beeper: [phone]

## 2013-03-30 NOTE — Progress Notes (Addendum)
VASCULAR PROGRESS NOTE  SUBJECTIVE: no specific complaints.  PHYSICAL EXAM: Filed Vitals:   03/30/13 0633 03/30/13 0759  BP: 147/76   Pulse: 51 48  Temp: 97.3 F (36.3 C)   TempSrc: Oral   Resp: 18   Height: 5\' 7"  (1.702 m)   Weight: 198 lb (89.812 kg)   SpO2: 96%    Has right groin hematoma.  LABS: Lab Results  Component Value Date   WBC 6.6 10/01/2012   HGB 13.3 03/30/2013   HCT 39.0 03/30/2013   MCV 92.1 10/01/2012   PLT 178 10/01/2012   Lab Results  Component Value Date   CREATININE 1.50* 03/30/2013   Lab Results  Component Value Date   INR 0.95 10/01/2012   ASSESSMENT AND PLAN: Patient has developed a small right groin hematoma after the sheath was removed. Given his age we will keep him overnight for observation. I will check a CBC in the morning.   Cari Caraway Beeper: [phone] 03/30/2013  Patient was transferred to 6500. There's was concerned that his hematoma had enlarged. Pressure was held. On exam, I do not see any evidence of change in his hematoma. Based on his anatomy this was a low stick I am not concerned about a high stick and retroperitoneal hematoma. Will check a CBC again now also in the morning. We will keep him at bed rest. I would like to avoid exploration given his history of aortic stenosis and his age which would put him at high risk for general anesthetic.  Waverly Ferrari, MD, FACS Beeper [phone] 03/30/2013

## 2013-03-30 NOTE — Preoperative (Signed)
Beta Blockers   Reason not to administer Beta Blockers:Coreg 0530 today 

## 2013-03-30 NOTE — Telephone Encounter (Signed)
Message copied by Margaretmary Eddy on Mon Mar 30, 2013 11:52 AM ------      Message from: Phillips Odor      Created: Mon Mar 30, 2013 11:16 AM      Regarding: FW: charge and f/u                   ----- Message -----         From: Chuck Hint, MD         Sent: 03/30/2013   9:15 AM           To: Reuel Derby, Melene Plan, RN, #      Subject: charge and f/u                                           PROCEDURE:       1. Ultrasound-guided access to the right common femoral artery      2. Aortogram with bilateral iliac arteriogram and left lower extremity runoff      3. Selective catheterization of the left anterior tibial artery with PTA of the left anterior tibial artery            SURGEON: Di Kindle. Edilia Bo, MD, FACS            He needs a follow up study in approximately 3 weeks with ABIs.      CD ------

## 2013-03-30 NOTE — Telephone Encounter (Signed)
lvm re appt info, sent letter - kf °

## 2013-03-30 NOTE — Interval H&P Note (Signed)
History and Physical Interval Note:  03/30/2013 7:37 AM  Eugene [name]  has presented today for surgery, with the diagnosis of pvd  The various methods of treatment have been discussed with the patient and family. After consideration of risks, benefits and other options for treatment, the patient has consented to  Procedure(s): ABDOMINAL AORTAGRAM (N/A) as a surgical intervention .  The patient's history has been reviewed, patient examined, no change in status, stable for surgery.  I have reviewed the patient's chart and labs.  Questions were answered to the patient's satisfaction.     Lilliahna Schubring S

## 2013-03-30 NOTE — Progress Notes (Signed)
Patient received from cath lab holding.  Right groin hematoma reported in phone hand off by phone report.  Upon arrival, hematoma had extended to approximately twice the size marked on leg/pubis.  Manual pressure applied at 1455 and continues at this time.  Dr. Edilia Bo called, on his way to see paitent.  Della Goo, NP here to examine patient.  Patient has vomitted approx 400 cc.

## 2013-03-30 NOTE — Op Note (Signed)
PATIENT: Eugene [name]   MRN: [id number] DOB: 1923-10-14    DATE OF PROCEDURE: 03/30/2013  INDICATIONS: Eugene [name] is a 79 y.o. male who presents with a nonhealing wound of his left great toe. He is 71 with significant cardiac history he is brought in for arteriography and possible endovascular revascularization of the left lower extremity.  PROCEDURE:  1. Ultrasound-guided access to the right common femoral artery 2. Aortogram with bilateral iliac arteriogram and left lower extremity runoff 3. Selective catheterization of the left anterior tibial artery with PTA of the left anterior tibial artery  SURGEON: Di Kindle. Edilia Bo, MD, FACS  ANESTHESIA: local with sedation   EBL: minimal  TECHNIQUE: The patient was brought to the peripheral vascular lab and she proceeded to half a milligram of Versed and 25 mcg of fentanyl. The groins were prepped and draped in usual sterile fashion. The skin was infiltrated with 1% lidocaine, and under ultrasound guidance, the right common femoral artery was cannulated. Initially the wire would not pass superiorly and repeatedly tracked inferiorly as the wire was obviously hitting a plaque. I tried to manipulate the needle to get around this but was unsuccessful therefore removed the needle and held pressure for hemostasis. The artery was then re\re cannulated under ultrasound guidance. A 5 French sheath was introduced over the wire and a pigtail catheter positioned at the L1 vertebral body. Flush aortogram was obtained. This cath was exchanged for a crossover catheter which was positioned into the left common iliac artery. The wire was then advanced into the external iliac artery and an end hole catheter placed. Selective left common femoral arteriotomy was obtained. This demonstrated 95% stenosis of the proximal anterior tibial artery which was then patent below this. The peroneal artery was occluded and was a short segment occlusion of the posterior tibial  artery on the left. I elected to address the anterior tibial artery stenosis.  A Rosen wire was advanced through the end hole catheter down into the superficial femoral artery and then the 5 French sheath was exchanged for a 6 [place] Terumo sheath. The patient then received 4500 units of heparin and then when the ACT was suboptimal, an additional 1500 units of heparin.  I was then able to advance a [place] core wire down the anterior tibial artery past the stenosis. I initially selected a 2 mm x 4 cm angiosculpt balloon which was inflated to 18 atmospheres for 60 seconds. The lesion film showed some residual stenosis. I went back with a 3 mm x 4 cm angiosculpt balloon which was inflated to 18 atmospheres for 60 seconds. Completion film showed excellent result with no residual stenosis. I then pulled the wire back and using the balloon as a guide attempted to traverse the short segment occlusion of the proximal posterior tibial artery but was not successful. Given his mildly elevated creatinine I elected not to study the right leg given he had normal Doppler signals on the right and no significant ulcers at this point.  At the completion the sheath was pulled back into the right external iliac artery. [name]. No immediate palpitations were noted.  FINDINGS:  1. Their single renal arteries bilaterally with a moderate left renal artery stenosis proximally. 2. Is diffuse calcific disease but the aorta, bilateral common iliac arteries, bilateral hypogastric arteries, and bilateral external iliac arteries, are patent. 3. On the left side, the common femoral, superficial femoral, deep femoral arteries are patent. There  is some mild disease of the distal left superficial femoral artery. Popped artery is patent. There is a 95% stenosis of the proximal left anterior tibial artery which was successfully and as described above with no residual stenosis. There  was mild disease of the proximal tibial peroneal trunk. The peroneal artery is occluded on the left. There is a short segment occlusion of the proximal posterior tibial artery on the left which then reconstitutes below that.   Waverly Ferrari, MD, FACS [facility]  DATE OF DICTATION:   03/30/2013

## 2013-03-31 DIAGNOSIS — L97509 Non-pressure chronic ulcer of other part of unspecified foot with unspecified severity: Secondary | ICD-10-CM

## 2013-03-31 LAB — CBC
HCT: 32.3 % — ABNORMAL LOW (ref 39.0–52.0)
Hemoglobin: 10.7 g/dL — ABNORMAL LOW (ref 13.0–17.0)
MCV: 93.4 fL (ref 78.0–100.0)
RBC: 3.46 MIL/uL — ABNORMAL LOW (ref 4.22–5.81)
RDW: 13.5 % (ref 11.5–15.5)
WBC: 7.6 10*3/uL (ref 4.0–10.5)

## 2013-03-31 MED ORDER — FERROUS SULFATE 325 (65 FE) MG PO TABS
325.0000 mg | ORAL_TABLET | Freq: Every day | ORAL | Status: DC
  Administered 2013-04-01 – 2013-04-02 (×2): 325 mg via ORAL
  Filled 2013-03-31 (×3): qty 1

## 2013-03-31 MED ORDER — TRAVOPROST (BAK FREE) 0.004 % OP SOLN
1.0000 [drp] | Freq: Every day | OPHTHALMIC | Status: DC
  Administered 2013-03-31 – 2013-04-01 (×2): 1 [drp] via OPHTHALMIC
  Filled 2013-03-31: qty 2.5

## 2013-03-31 MED ORDER — AMIODARONE HCL 200 MG PO TABS
200.0000 mg | ORAL_TABLET | Freq: Every day | ORAL | Status: DC
  Administered 2013-03-31 – 2013-04-02 (×3): 200 mg via ORAL
  Filled 2013-03-31 (×3): qty 1

## 2013-03-31 MED ORDER — DOCUSATE SODIUM 100 MG PO CAPS
100.0000 mg | ORAL_CAPSULE | Freq: Two times a day (BID) | ORAL | Status: DC
  Administered 2013-03-31 – 2013-04-02 (×4): 100 mg via ORAL
  Filled 2013-03-31 (×5): qty 1

## 2013-03-31 MED ORDER — CARVEDILOL 6.25 MG PO TABS
6.2500 mg | ORAL_TABLET | Freq: Two times a day (BID) | ORAL | Status: DC
  Administered 2013-03-31 – 2013-04-01 (×3): 6.25 mg via ORAL
  Filled 2013-03-31 (×6): qty 1

## 2013-03-31 MED ORDER — SPIRONOLACTONE 12.5 MG HALF TABLET
12.5000 mg | ORAL_TABLET | Freq: Every morning | ORAL | Status: DC
  Administered 2013-03-31 – 2013-04-02 (×3): 12.5 mg via ORAL
  Filled 2013-03-31 (×3): qty 1

## 2013-03-31 MED ORDER — TRAVOPROST 0.004 % OP SOLN: 1.0000 [drp] | Freq: Every day | OPHTHALMIC | Status: DC

## 2013-03-31 MED ORDER — LEVOTHYROXINE SODIUM 25 MCG PO TABS
25.0000 ug | ORAL_TABLET | Freq: Every day | ORAL | Status: DC
  Administered 2013-03-31 – 2013-04-02 (×3): 25 ug via ORAL
  Filled 2013-03-31 (×4): qty 1

## 2013-03-31 MED ORDER — SIMVASTATIN 20 MG PO TABS
20.0000 mg | ORAL_TABLET | Freq: Every day | ORAL | Status: DC
  Administered 2013-03-31 – 2013-04-01 (×2): 20 mg via ORAL
  Filled 2013-03-31 (×4): qty 1

## 2013-03-31 MED ORDER — PANTOPRAZOLE SODIUM 40 MG PO TBEC: 40.0000 mg | DELAYED_RELEASE_TABLET | Freq: Every day | ORAL | Status: DC

## 2013-03-31 MED ORDER — FUROSEMIDE 40 MG PO TABS
40.0000 mg | ORAL_TABLET | Freq: Every morning | ORAL | Status: DC
Start: 2013-03-31 — End: 2013-04-02
  Administered 2013-03-31 – 2013-04-02 (×3): 40 mg via ORAL
  Filled 2013-03-31 (×3): qty 1

## 2013-03-31 MED ORDER — LOTEPREDNOL ETABONATE 0.5 % OP SUSP
1.0000 [drp] | Freq: Every morning | OPHTHALMIC | Status: DC
  Administered 2013-03-31 – 2013-04-02 (×3): 1 [drp] via OPHTHALMIC
  Filled 2013-03-31: qty 5

## 2013-03-31 MED ORDER — ASPIRIN EC 81 MG PO TBEC
81.0000 mg | DELAYED_RELEASE_TABLET | Freq: Every day | ORAL | Status: DC
  Administered 2013-03-31 – 2013-04-02 (×3): 81 mg via ORAL
  Filled 2013-03-31 (×3): qty 1

## 2013-03-31 MED ORDER — LOSARTAN POTASSIUM 50 MG PO TABS
50.0000 mg | ORAL_TABLET | Freq: Every day | ORAL | Status: DC
  Administered 2013-03-31 – 2013-04-01 (×2): 50 mg via ORAL
  Filled 2013-03-31 (×3): qty 1

## 2013-03-31 MED ORDER — AMLODIPINE BESYLATE 5 MG PO TABS
5.0000 mg | ORAL_TABLET | Freq: Every day | ORAL | Status: DC
Start: 2013-03-31 — End: 2013-04-02
  Administered 2013-03-31 – 2013-04-01 (×2): 5 mg via ORAL
  Filled 2013-03-31 (×3): qty 1

## 2013-03-31 NOTE — Care Management Note (Addendum)
    Page 1 of 1   04/02/2013     4:11:38 PM   CARE MANAGEMENT NOTE 04/02/2013  Patient:  KAISEI, GILBO   Account Number:  [id number]  Date Initiated:  03/31/2013  Documentation initiated by:  [facility]  Subjective/Objective Assessment:   79 y.o. male well-developed wounds on both feet approximately 4 months ago after he was wearing shoes that were too tight for him.     Action/Plan:   Abdominal Aortagram/ possible SNF placement   Anticipated DC Date:  04/02/2013   Anticipated DC Plan:  SKILLED NURSING FACILITY  In-house referral  Clinical Social Worker      DC Planning Services  CM consult      Choice offered to / List presented to:             Status of service:  Completed, signed off Medicare Important Message given?   (If response is "NO", the following Medicare IM given date fields will be blank) Date Medicare IM given:   Date Additional Medicare IM given:    Discharge Disposition:  SKILLED NURSING FACILITY  Per UR Regulation:  Reviewed for med. necessity/level of care/duration of stay  If discussed at Long Length of Stay Meetings, dates discussed:    Comments:  04/02/13 Seymore Brodowski,RN,BSN [phone] PT DISCHARGED TO SNF TODAY, PER CSW ARRANGEMENTS.  03/31/13.Marland KitchenMarland KitchenOletta Cohn, RN, BSN, [place] [phone] Pt lives home alone.  States he has daughter that "won't come see me."  PT recommennds SNF placement possibe [facility] for rehab.  CSW Genelle Bal working up pt for placement.

## 2013-03-31 NOTE — Progress Notes (Signed)
Pt setup with CPAP nasal mask. Auto Titrate Hi: 18 Lo: 6. No O2 bled in. Pt wear CPAP at home and wishes to put mask on when he is ready. RT will continue to monitor

## 2013-03-31 NOTE — Progress Notes (Signed)
Patient will not be wearing CPAP tonight. He says it is too late at night for him to wear it now.

## 2013-03-31 NOTE — Progress Notes (Signed)
VASCULAR PROGRESS NOTE  SUBJECTIVE: No complaints this AM.  PHYSICAL EXAM: Filed Vitals:   03/30/13 2100 03/31/13 0034 03/31/13 0544 03/31/13 0806  BP: 106/73 130/75 140/66 150/62  Pulse:  52 61 56  Temp:  97.7 F (36.5 C) 97.7 F (36.5 C) 97.4 F (36.3 C)  TempSrc:  Oral Oral Oral  Resp: 18 18 16 16   Height:      Weight:  202 lb 9.6 oz (91.9 kg)    SpO2: 95% 97% 97% 97%   Ecchymosis right groin, but no significant mass. With firm compression yesterday, the hematoma was compressed into the surrounding tissue.  Both feet warm. Palpable popliteal pulses.    LABS: Lab Results  Component Value Date   WBC 7.6 03/31/2013   HGB 10.7* 03/31/2013   HCT 32.3* 03/31/2013   MCV 93.4 03/31/2013   PLT 167 03/31/2013   Lab Results  Component Value Date   CREATININE 1.15 03/30/2013   Lab Results  Component Value Date   INR 1.10 03/30/2013    ASSESSMENT AND PLAN:  * Drop in hgb not unexpected given the post procedural hematoma. No hypotension and the stick was low, so think that there is a very low risk of RP hematoma.   * Ambulate with PT this AM and then plan D/C.   * Successful PTA of left ATA stenosis. Left great toe wound dry, no erythema.   Cari Caraway Beeper: [phone] 03/31/2013

## 2013-03-31 NOTE — Evaluation (Signed)
Occupational Therapy Evaluation Patient Details Name: Eugene [name] MRN: [id number] DOB: Feb 26, 1924 Today's Date: 03/31/2013 Time: 4098-1191 OT Time Calculation (min): 31 min  OT Assessment / Plan / Recommendation Clinical Impression  Pt is an 79 yr old male admitted for Aortogram with bilateral iliac arteriogram and left lower extremity runoff, secondary to PVD.  Pt currently min assist to min guard assist for mobility and selfcare tasks.  Lives alone and has history of frequent fallat home as well.  Think he will need SNF for follow-up therapy based on discharge situation and home setup.  Feel pt should also consider transition to ALF since his house has multiple steps inside just to go to the bedroom, bathroom, kitchen, etc.      OT Assessment  Patient needs continued OT Services    Follow Up Recommendations  SNF    Barriers to Discharge Decreased caregiver support    Equipment Recommendations  None recommended by OT       Frequency  Min 2X/week    Precautions / Restrictions Precautions Precautions: Fall Precaution Comments: Repeated h/o falls Restrictions Weight Bearing Restrictions: No   Pertinent Vitals/Pain No report of pain during session, O2 sats 97% with HR 75 BPM    ADL  Eating/Feeding: Simulated;Independent Where Assessed - Eating/Feeding: Chair Grooming: Performed;Min guard Where Assessed - Grooming: Supported standing Upper Body Bathing: Simulated;Set up Where Assessed - Upper Body Bathing: Unsupported sitting Lower Body Bathing: Simulated;Min guard Where Assessed - Lower Body Bathing: Supported sit to stand Upper Body Dressing: Simulated;Set up Where Assessed - Upper Body Dressing: Unsupported sitting Lower Body Dressing: Performed;Minimal assistance Where Assessed - Lower Body Dressing: Supported sit to stand Toilet Transfer: Min guard;Performed Statistician Method: Other (comment) (ambulate with assistive device) Acupuncturist: Comfort  height toilet;Grab bars Toileting - Clothing Manipulation and Hygiene: Simulated;Min guard Where Assessed - Engineer, mining and Hygiene: Sit to stand from 3-in-1 or toilet Tub/Shower Transfer Method: Not assessed Equipment Used: Rolling walker Transfers/Ambulation Related to ADLs: Pt is overall min guard assist for mobility with use of the RW.   ADL Comments: Pt with slight decreased ability to reach his LLE for dressing tasks.  Reports having walker with walker tray for home but has multiple steps throughout the house to negotiate to get to the kitchen and to the bathroom.  Will benefit from SNF for rehab secondary to not having assistance at home.       OT Diagnosis: Generalized weakness  OT Problem List: Decreased strength;Decreased activity tolerance;Impaired balance (sitting and/or standing);Decreased safety awareness OT Treatment Interventions: Self-care/ADL training;Patient/family education;Balance training;Therapeutic activities;DME and/or AE instruction   OT Goals Acute Rehab OT Goals OT Goal Formulation: With patient Time For Goal Achievement: 04/14/13 Potential to Achieve Goals: Good ADL Goals Pt Will Perform Grooming: with modified independence;Standing at sink ADL Goal: Grooming - Progress: Goal set today Pt Will Perform Lower Body Bathing: with modified independence;Sit to stand from bed ADL Goal: Lower Body Bathing - Progress: Progressing toward goals Pt Will Perform Lower Body Dressing: with modified independence;with adaptive equipment;Sit to stand from bed ADL Goal: Lower Body Dressing - Progress: Goal set today Pt Will Transfer to Toilet: with modified independence;with DME;Ambulation;3-in-1 ADL Goal: Toilet Transfer - Progress: Goal set today Pt Will Perform Toileting - Clothing Manipulation: with modified independence;Sitting on 3-in-1 or toilet;Standing ADL Goal: Toileting - Clothing Manipulation - Progress: Goal set today Pt Will Perform Toileting -  Hygiene: with modified independence;Sit to stand from 3-in-1/toilet ADL Goal: Toileting - Hygiene -  Progress: Goal set today  Visit Information  Last OT Received On: 03/31/13 Assistance Needed: +1    Subjective Data  Subjective: I don't think I need to go to rehab but I will I guess. Patient Stated Goal: Pt wants to go home.     Prior Functioning     Home Living Lives With: Alone Available Help at Discharge:  (nurse at night) Type of Home: House Home Access: Ramped entrance Entrance Stairs-Rails: Can reach both Home Layout: Two level Alternate Level Stairs-Number of Steps: 5-6 Alternate Level Stairs-Rails: Right;Left Bathroom Shower/Tub: Tub only Firefighter: Standard Home Adaptive Equipment: Shower chair without back;Walker - rolling Prior Function Level of Independence: Independent with assistive device(s) Able to Take Stairs?: Yes Driving: No Comments: Pt reports frequent falls with 2 in past 2 weeks. Pt reports "I just call the EMTs and they come pick me up and here we go again."  Communication Communication: HOH Dominant Hand: Right         Vision/Perception Vision - History Baseline Vision: No visual deficits Patient Visual Report: No change from baseline Vision - Assessment Eye Alignment: Within Functional Limits Vision Assessment: Vision not tested Perception Perception: Within Functional Limits Praxis Praxis: Intact   Cognition  Cognition Arousal/Alertness: Awake/alert Behavior During Therapy: Impulsive Overall Cognitive Status: Impaired/Different from baseline Area of Impairment: Safety/judgement;Awareness Current Attention Level: Selective Memory: Decreased short-term memory Safety/Judgement: Decreased awareness of safety Awareness: Intellectual Problem Solving: Slow processing General Comments: Pt feels he does not really need therapy even though he has a history of falls frequently at home.  Poor awareness that his home is not safe with  the current changes in levels with steps.      Extremity/Trunk Assessment Right Upper Extremity Assessment RUE ROM/Strength/Tone: Deficits RUE ROM/Strength/Tone Deficits: Shoulder flexion AROM 0-90 degrees, pt with history of fractured shoulder previously.  All other joints AROM WFLs and strength 4/5 throughout. RUE Sensation: WFL - Light Touch RUE Coordination: Deficits RUE Coordination Deficits: Pt reports decreased ability to perform buttons and tying. 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  Decreased ability to manipulate buttons. Trunk Assessment Trunk Assessment: Normal     Mobility Bed Mobility Bed Mobility: Supine to Sit Supine to Sit: 5: Supervision;With rails Details for Bed Mobility Assistance: Pt with decreased awareness of lower portion of bed being elevated, kept trying to sit up without request to lower the bed. Pt also attempting to sit up while bed alarm on.  Transfers Transfers: Sit to Stand Sit to Stand: 4: Min assist;With upper extremity assist;From toilet Stand to Sit: 4: Min assist;To chair/3-in-1;To toilet;With upper extremity assist Details for Transfer Assistance: Pt impulsive with transitioning from standing to sitting, not reaching back to the chair before sitting.          Balance Balance Balance Assessed: Yes Static Standing Balance Static Standing - Balance Support: Right upper extremity supported;Left upper extremity supported Static Standing - Level of Assistance: 5: Stand by assistance Static Standing - Comment/# of Minutes: Decreased stability however pt reports balance is not an issue   End of Session OT - End of Session Activity Tolerance: Patient limited by fatigue Patient left: in chair;with call bell/phone within reach;with chair alarm set Nurse  Communication: Mobility status     Sheela Mcculley OTR/L Pager number [phone] 03/31/2013, 12:58 PM

## 2013-03-31 NOTE — Evaluation (Signed)
Physical Therapy Evaluation Patient Details Name: Eugene [name] MRN: [id number] DOB: 02/18/1924 Today's Date: 03/31/2013 Time: 0272-5366 PT Time Calculation (min): 37 min  PT Assessment / Plan / Recommendation Clinical Impression  79 yo pt admitted s/p aortagram surgery performed as a result of PVD. Pt currently needs min assist with mobility secondary to poor balance,  Decreased awareness of deficits, decreased safety with RW, some impulsivity, and decreased memory. Pt currently lives alone most of the day and has had recent h/o 2 falls, pt reports "I just call the EMTs, they get me up and here we go again." Pt has to negotiate steps within home to access bed and bath which will be very unsafe. Recommend SNF  followup at this time.     PT Assessment  Patient needs continued PT services    Follow Up Recommendations  SNF;Supervision/Assistance - 24 hour       Barriers to Discharge Decreased caregiver support;Inaccessible home environment      Equipment Recommendations  None recommended by PT       Frequency Min 3X/week    Precautions / Restrictions Precautions Precautions: Fall Precaution Comments: Repeated h/o falls   Pertinent Vitals/Pain No c/o pain, SpO2 down to 90% during ambulation on RA, HR up to 92 bpm.       Mobility  Bed Mobility Bed Mobility: Supine to Sit Supine to Sit: 5: Supervision;With rails Details for Bed Mobility Assistance: Pt with decreased awareness of lower portion of bed being elevated, kept trying to sit up without request to lower the bed. Pt also attempting to sit up while bed alarm on.  Transfers Transfers: Sit to Stand;Stand to Sit Sit to Stand: 4: Min assist Stand to Sit: 4: Min assist Details for Transfer Assistance: Sit > stand min assist for slight balance deficits, stand > sit min assist needed due to decreased safety with RW, decreased stability with fatigue.  Ambulation/Gait Ambulation/Gait Assistance: 4: Min assist Ambulation Distance  (Feet): 100 Feet Assistive device: Rolling walker Ambulation/Gait Assistance Details: Pt tends to allow RW to advance significantly too far ahead of self, only able to correct with verbal cues for ~1 min then reverts to progressing RW further anteriorly Gait Pattern: Step-through pattern;Trunk flexed (decreased coordination of bil. LEs, poor foot clearance) Stairs: No Wheelchair Mobility Wheelchair Mobility: No        PT Diagnosis: Difficulty walking;Abnormality of gait;Generalized weakness;Altered mental status  PT Problem List: Decreased strength;Decreased activity tolerance;Decreased balance;Decreased mobility;Decreased cognition;Impaired sensation;Decreased knowledge of precautions;Decreased safety awareness;Decreased knowledge of use of DME PT Treatment Interventions: DME instruction;Gait training;Stair training;Functional mobility training;Therapeutic activities;Therapeutic exercise;Balance training;Neuromuscular re-education;Cognitive remediation;Patient/family education   PT Goals Acute Rehab PT Goals PT Goal Formulation: With patient Time For Goal Achievement: 04/07/13 Potential to Achieve Goals: Good Pt will go Supine/Side to Sit: with modified independence PT Goal: Supine/Side to Sit - Progress: Goal set today Pt will go Sit to Supine/Side: with modified independence PT Goal: Sit to Supine/Side - Progress: Goal set today Pt will go Sit to Stand: with supervision PT Goal: Sit to Stand - Progress: Goal set today Pt will go Stand to Sit: with supervision PT Goal: Stand to Sit - Progress: Goal set today Pt will Ambulate: 51 - 150 feet;with supervision PT Goal: Ambulate - Progress: Goal set today Pt will Go Up / Down Stairs: 3-5 stairs;with supervision;with rail(s) PT Goal: Up/Down Stairs - Progress: Goal set today  Visit Information  Last PT Received On: 03/31/13 Assistance Needed: +1    Subjective Data  Subjective: Somebody needs to help me out here. (Pt with breakfast  all over front and water all over floor, pt attempting to sit at EOB) Patient Stated Goal: Go home   Prior Functioning  Home Living Lives With: Alone Available Help at Discharge:  (nurse at night) Type of Home: House Home Access: Ramped entrance Entrance Stairs-Rails: Can reach both Home Layout: Two level Alternate Level Stairs-Number of Steps: 5-6 Alternate Level Stairs-Rails: Right;Left Bathroom Shower/Tub: Tub only Firefighter: Standard Home Adaptive Equipment: Shower chair without back;Walker - rolling Prior Function Level of Independence: Independent with assistive device(s) Able to Take Stairs?: Yes Driving: No Comments: Pt reports frequent falls with 2 in past 2 weeks. Pt reports "I just call the EMTs and they come pick me up and here we go again."  Communication Communication: HOH    Cognition  Cognition Arousal/Alertness: Awake/alert Behavior During Therapy: Impulsive Overall Cognitive Status: Impaired/Different from baseline (although suspect many deficits are his baseline) Area of Impairment: Attention;Memory;Safety/judgement;Awareness;Problem solving Current Attention Level: Selective Memory: Decreased short-term memory Safety/Judgement: Decreased awareness of safety;Decreased awareness of deficits Awareness: Intellectual Problem Solving: Slow processing General Comments: Pt found sitting in bed saturated with urine, pt reports he had trouble hitting urinal when asked about it. Pt also completely covered with breakfast, needed new gown and to be wiped down by therapist. Pt had water all over tray, phone, floor pt reports he poured the water out to wash his hands off. Pt unable to find room after short ambulation and after therapist gave him room # x 3. Decreased awareness of deficits throughout treatment, decreased awareness of consequences of falling "It's no big deal, I just call the EMTs and here we go again." Pt unsafe with negotiation of RW.        Balance  Balance Balance Assessed: Yes Static Standing Balance Static Standing - Balance Support: Bilateral upper extremity supported Static Standing - Level of Assistance: 4: Min assist Static Standing - Comment/# of Minutes: Decreased stability however pt reports balance is not an issue  End of Session PT - End of Session Equipment Utilized During Treatment: Gait belt Patient left: in chair;with call bell/phone within reach Nurse Communication: Mobility status  GP Functional Limitation: Mobility: Walking and moving around Mobility: Walking and Moving Around Current Status ([id number]): At least 40 percent but less than 60 percent impaired, limited or restricted Mobility: Walking and Moving Around Goal Status [phone]): At least 1 percent but less than 20 percent impaired, limited or restricted   Wilhemina Bonito 03/31/2013, 9:51 AM

## 2013-03-31 NOTE — Progress Notes (Signed)
*  PRELIMINARY RESULTS* Vascular Ultrasound Right groin pseudoaneurysm check  has been completed.  Preliminary findings: No evidence of a pseudo. A hematoma is noted.    Farrel Demark, RDMS, RVT  03/31/2013, 1:28 PM

## 2013-04-01 NOTE — Progress Notes (Signed)
Updated patient and anticipte SNF bed for him at [facility]-  He is pleased with this locatin- FL2 on chart for MD signature  Reece Levy, MSW, [employer] [phone]

## 2013-04-01 NOTE — Progress Notes (Signed)
Patient's HR has been in the upper 30s-low 40s during the night while sleeping.  Patient is wearing his CPAP and it has been fitted properly and checked twice by respiratory.  He was put on two liters of oxygen while sleeping. Patient is easily arousable. Will continue to monitor.

## 2013-04-01 NOTE — Clinical Social Work Placement (Signed)
Clinical Social Work Department CLINICAL SOCIAL WORK PLACEMENT NOTE 04/01/2013  Patient:  Eugene [name], Eugene [name]  Account Number:  [id number] Admit date:  03/30/2013  Clinical Social Worker:  Genelle Bal, LCSW  Date/time:  04/01/2013 10:27 AM  Clinical Social Work is seeking post-discharge placement for this patient at the following level of care:   SKILLED NURSING   (*CSW will update this form in Epic as items are completed)   03/31/2013  Patient/family provided with [facility] Department of Clinical Social Work's list of facilities offering this level of care within the geographic area requested by the patient (or if unable, by the patient's family).  03/31/2013  Patient/family informed of their freedom to choose among providers that offer the needed level of care, that participate in Medicare, Medicaid or managed care program needed by the patient, have an available bed and are willing to accept the patient.    Patient/family informed of [facility]' ownership interest in [facility], as well as of the fact that they are under no obligation to receive care at this facility.  PASARR submitted to EDS on  PASARR number received from EDS on   FL2 transmitted to all facilities in geographic area requested by pt/family on  03/31/2013 FL2 transmitted to all facilities within larger geographic area on   Patient informed that his/her managed care company has contracts with or will negotiate with  certain facilities, including the following:     Patient/family informed of bed offers received:   Patient chooses bed at  Physician recommends and patient chooses bed at    Patient to be transferred to  on   Patient to be transferred to facility by   The following physician request were entered in Epic:   Additional Comments:

## 2013-04-01 NOTE — Progress Notes (Signed)
VASCULAR PROGRESS NOTE  SUBJECTIVE: Comfortable  PHYSICAL EXAM: Filed Vitals:   03/31/13 1228 03/31/13 1955 03/31/13 2101 04/01/13 0529  BP:  117/75  157/72  Pulse: 72 54 60 50  Temp:  97.8 F (36.6 C)  98.3 F (36.8 C)  TempSrc:  Oral  Axillary  Resp:  18 18 18   Height:      Weight:      SpO2: 97% 96% 96% 99%   Ecchymosis right groin but no significant masses noted. Both feet warm and well-perfused.  LABS: Lab Results  Component Value Date   WBC 7.6 03/31/2013   HGB 10.7* 03/31/2013   HCT 32.3* 03/31/2013   MCV 93.4 03/31/2013   PLT 167 03/31/2013   Lab Results  Component Value Date   CREATININE 1.15 03/30/2013   Lab Results  Component Value Date   INR 1.10 03/30/2013   ASSESSMENT AND PLAN:  * no evidence of pseudoaneurysm on duplex.  * Discharge to [facility] for physical therapy when bed available.  * I'll see him back in the office in 3 weeks.  Cari Caraway Beeper: [phone] 04/01/2013

## 2013-04-01 NOTE — Clinical Social Work Psychosocial (Signed)
Clinical Social Work Department BRIEF PSYCHOSOCIAL ASSESSMENT 04/01/2013  Patient:  Eugene [name], Eugene [name]     Account Number:  [id number]     Admit date:  03/30/2013  Clinical Social Worker:  Delmer Islam  Date/Time:  04/01/2013 08:28 AM  Referred by:  Physician  Date Referred:  03/31/2013 Referred for  SNF Placement   Other Referral:   Interview type:  Family Other interview type:   CSW talked with patient and his friend and POA [name] McDEaniel [phone])    PSYCHOSOCIAL DATA Living Status:  ALONE Admitted from facility:   Level of care:   Primary support name:  Allison Quarry Primary support relationship to patient:  FRIEND Degree of support available:   Mr. Julien Girt is patient's POA    CURRENT CONCERNS Current Concerns  Post-Acute Placement   Other Concerns:    SOCIAL WORK ASSESSMENT / PLAN On 03/31/13 CSW talked with patient regarding d/c plans and PT/MD's recommendation of ST rehab. Patient reported that he has been to rehab before at [facility].  Mr. Vallecillo stated that he has friends that can assist him: Lynnell Grain, Benard Halsted and male friend Aram Beecham. Patient also added that Mr. Julien Girt can get someone to stay with him for a few days.    CSW was given permission to contact Mr. Julien Girt, which was done by phone in patient's room. After being updated regarding our conversation, Mr. Julien Girt indicated that he was not in agreement with patient going home as he needed rehab for a fast recuperation. Mr. Julien Girt added that he would have to determine if patient can afford to have someone with him 24/7 for a few days. This information was relayed to patient and he was informed that Mr. Julien Girt was coming to the hospital to visit with him. When asked, patient did give CSW permission to send his information out to facilities. CSW later met with Mr. Julien Girt and provided him with a SNF list.   Assessment/plan status:  Psychosocial Support/Ongoing Assessment  of Needs Other assessment/ plan:   Information/referral to community resources:   POA given SNF list on 03/31/13    PATIENT'S/FAMILY'S RESPONSE TO PLAN OF CARE: Mr. Rozo and friend (POA) Mr. Julien Girt very receptive to talking with CSW. Patient agreeable to SNF search and Mr. Julien Girt reported that he will talk with patient about SNF placement.

## 2013-04-01 NOTE — Discharge Summary (Signed)
[facility] Discharge Summary   Patient ID:  Eugene [name] MRN: [id number] DOB/AGE: Feb 10, 1924 79 y.o.  Admit date: 03/30/2013 Discharge date: 04/02/2013  Date of Surgery: 03/30/2013 Surgeon: Surgeon(s): Chuck Hint, MD  Admission Diagnosis: pvd Hematoma  Discharge Diagnoses:  pvd Hematoma  Secondary Diagnoses: Past Medical History  Diagnosis Date  . Glaucoma     Right eye  . Diverticulosis     multiple hospital admissions for GI bleed, has an episode roughly every 6 months,, last  colonscopy in 2006 showed diverticulosis, was evaluated by Dr. Madilyn Fireman in the hospital in 2011 for lower GI bleed andd it  was suggested that the patient was not actively bleeding at that time and given his comorbid conditions colonoscopy was deferred. He is also chronically constipated  . Macular degeneration of right eye   . Allergic rhinitis   . CAD (coronary artery disease)     s/p PCI in 1991;  Cardiac catheterization 11/02/11: Ostial LAD 95% with aneurysmal dilatation after this, proximal circumflex 70% with a superior branch 30-40%, proximal RCA 75% and 90%, mid RCA 50% and 90% before a PDA, then occluded.;    high risk for CABG;PCI 11/14/11: Bare-metal stent to the proximal LAD    . OSA on CPAP   . Hypertension   . Hyperlipidemia   . Chronic systolic heart failure     Echocardiogram 11/02/11: Moderate LVH, EF 30-35%, multiple wall motion abnormalities, critical aortic stenosis, AVA 0.6, mean gradient 45, mild MR, severe LAE  . Supraventricular tachycardia     2 syndromes-nonsustained atrial tachycardia//adenosine responsive diuretic positive reentry probably AV node reentry  . Aortic stenosis     s/p AV balloon valvuloplasty by Dr. Excell Seltzer 11/2011  . Ischemic cardiomyopathy   . H/O: GI bleed     recurrent  . Heart murmur   . CHF (congestive heart failure)   . Myocardial infarction 10/28/2011  . Pneumonia 2012  . Exertional shortness of breath   . Hypothyroidism   .  GERD (gastroesophageal reflux disease)   . Arthritis     "might have slight; have trouble w/my fingers" (03/30/2013)    Procedure(s): ABDOMINAL AORTAGRAM ANGIOGRAM EXTREMITY LEFT PTA PERIPHERAL ARTERY  Discharged Condition: good  HPI: Eugene [name] is a 79 y.o. male well-developed wounds on both feet approximately 4 months ago after he was wearing shoes that were too tight for him. He has been followed by Dr. Elvin So and has shown some improvement however given the slow progress he was sent for vascular consultation. He denies any history of significant claudication all the way think his activity is very limited because of his age. He denies any history of rest pain. He does state that he lives at home and is ambulatory with a walker. He denies any history of diabetes. He does not smoke    Hospital Course:  Eugene [name] is a 79 y.o. male is S/P Procedure(s): ABDOMINAL AORTAGRAM ANGIOGRAM EXTREMITY LEFT PTA PERIPHERAL ARTERY Pt developed right groin hematoma. He was kept overnight for observation. Physical therapy walked pt and did not feel he was safe to return home alone. They recommended short term SNF for rehab. Physical exam: Right groin soft with resolving hematoma USG of right groins showed NO psuedoaneurysm Pt remained stable Pt. voiding and taking PO diet without difficulty. Pt pain controlled with PO pain meds. Labs as below Complications: post angio hematoma  Consults:     Significant Diagnostic Studies: CBC Lab Results  Component Value Date  WBC 7.6 03/31/2013   HGB 10.7* 03/31/2013   HCT 32.3* 03/31/2013   MCV 93.4 03/31/2013   PLT 167 03/31/2013    BMET    Component Value Date/Time   NA 137 03/30/2013 1331   K 4.4 03/30/2013 1331   CL 109 03/30/2013 1331   CO2 22 03/30/2013 1331   GLUCOSE 129* 03/30/2013 1331   BUN 28* 03/30/2013 1331   CREATININE 1.15 03/30/2013 1331   CREATININE 1.87* 03/12/2013 1452   CALCIUM 7.9* 03/30/2013 1331   GFRNONAA 56* 03/30/2013  1331   GFRAA 65* 03/30/2013 1331   COAG Lab Results  Component Value Date   INR 1.10 03/30/2013   INR 0.95 10/01/2012   INR 1.05 12/12/2011     Disposition:  Discharge to SNF Discharge Orders   Future Appointments Provider Department Dept Phone   04/29/2013 11:30 AM [facility] [facility] [phone]   04/29/2013 12:30 PM Chuck Hint, MD [facility] [phone]   06/02/2013 2:15 PM Tonny Bollman, MD [facility] Main Office [place]) [phone]   Future Orders Complete By Expires     Discharge patient  As directed     Comments:      Discharge pt to SNF when bed available        Medication List    TAKE these medications       amiodarone 200 MG tablet  Commonly known as:  PACERONE  Take 200 mg by mouth daily.     amLODipine 5 MG tablet  Commonly known as:  NORVASC  Take 5 mg by mouth daily.     aspirin EC 81 MG tablet  Take 81 mg by mouth daily.     CALCIUM + D PO  Take 1 tablet by mouth daily.     carvedilol 6.25 MG tablet  Commonly known as:  COREG  Take 1 tablet (6.25 mg total) by mouth 2 (two) times daily.     docusate sodium 100 MG capsule  Commonly known as:  COLACE  Take 100 mg by mouth 2 (two) times daily.     ferrous sulfate 325 (65 FE) MG tablet  Take 325 mg by mouth daily with breakfast.     furosemide 40 MG tablet  Commonly known as:  LASIX  Take 40 mg by mouth every morning.     levothyroxine 25 MCG tablet  Commonly known as:  LEVOTHROID  Take 1 tablet (25 mcg total) by mouth daily.     losartan 50 MG tablet  Commonly known as:  COZAAR  Take 50 mg by mouth daily.     loteprednol 0.5 % ophthalmic suspension  Commonly known as:  LOTEMAX  Place 1 drop into the right eye every morning.     magnesium hydroxide 400 MG/5ML suspension  Commonly known as:  MILK OF MAGNESIA  Take 30 mLs by mouth daily as needed. For constipation     pantoprazole 40 MG tablet   Commonly known as:  PROTONIX  Take 40 mg by mouth daily.     pravastatin 40 MG tablet  Commonly known as:  PRAVACHOL  Take 40 mg by mouth every evening.     spironolactone 25 MG tablet  Commonly known as:  ALDACTONE  Take 12.5 mg by mouth every morning.     travoprost (benzalkonium) 0.004 % ophthalmic solution  Commonly known as:  TRAVATAN  Place 1 drop into the right eye at bedtime.       Verbal  and written Discharge instructions given to the patient. Wound care per Discharge AVS   Signed: Marlowe Shores 04/01/2013, 9:29 PM

## 2013-04-02 ENCOUNTER — Encounter: Payer: Self-pay | Admitting: Vascular Surgery

## 2013-04-02 NOTE — Discharge Summary (Signed)
Agree with plans for discharge today.  Waverly Ferrari, MD, FACS Beeper [phone] 04/02/2013

## 2013-04-02 NOTE — Progress Notes (Signed)
Pt d/c to [facility] via [facility], report called to facility

## 2013-04-02 NOTE — Progress Notes (Signed)
Placed pt. On CPAP via nasal mask, auto titrate settings (min 5.0, max 18.0) with 2 lpm O2 bleed in.  Pt. Tolerating well at this time. RN aware.

## 2013-04-02 NOTE — Progress Notes (Signed)
Clinical Social Work Department CLINICAL SOCIAL WORK PLACEMENT NOTE 04/02/2013  Patient:  Eugene [name], Eugene [name]  Account Number:  [id number] Admit date:  03/30/2013  Clinical Social Worker:  Genelle Bal, LCSW  Date/time:  04/01/2013 10:27 AM  Clinical Social Work is seeking post-discharge placement for this patient at the following level of care:   SKILLED NURSING   (*CSW will update this form in Epic as items are completed)   03/31/2013  Patient/family provided with [facility] Department of Clinical Social Work's list of facilities offering this level of care within the geographic area requested by the patient (or if unable, by the patient's family).  03/31/2013  Patient/family informed of their freedom to choose among providers that offer the needed level of care, that participate in Medicare, Medicaid or managed care program needed by the patient, have an available bed and are willing to accept the patient.    Patient/family informed of [facility]' ownership interest in [facility], as well as of the fact that they are under no obligation to receive care at this facility.  PASARR submitted to EDS on  PASARR number received from EDS on   FL2 transmitted to all facilities in geographic area requested by pt/family on  03/31/2013 FL2 transmitted to all facilities within larger geographic area on   Patient informed that his/her managed care company has contracts with or will negotiate with  certain facilities, including the following:     Patient/family informed of bed offers received:  04/02/2013 Patient chooses bed at [facility] Physician recommends and patient chooses bed at    Patient to be transferred to [facility] on  04/02/2013 Patient to be transferred to facility by ems  The following physician request were entered in Epic: Reece Levy, MSW, [place] [phone]

## 2013-04-02 NOTE — Progress Notes (Signed)
VASCULAR PROGRESS NOTE  SUBJECTIVE: No complaints.  PHYSICAL EXAM: Filed Vitals:   04/01/13 2000 04/01/13 2235 04/02/13 0517 04/02/13 1121  BP: 98/50  102/49 90/48  Pulse:  52 51   Temp:   98.4 F (36.9 C)   TempSrc:   Oral   Resp:  18 18   Height:      Weight:      SpO2:  97% 95%    Ecchymosis right groin. Palpable left dorsalis pedis pulse.  LABS: Lab Results  Component Value Date   WBC 7.6 03/31/2013   HGB 10.7* 03/31/2013   HCT 32.3* 03/31/2013   MCV 93.4 03/31/2013   PLT 167 03/31/2013   Lab Results  Component Value Date   CREATININE 1.15 03/30/2013   Lab Results  Component Value Date   INR 1.10 03/30/2013   CBG (last 3)  No results found for this basename: GLUCAP,  in the last 72 hours  ASSESSMENT AND PLAN: Plan discharge to [facility] today at that available.  Cari Caraway Beeper: [phone] 04/02/2013

## 2013-04-02 NOTE — Progress Notes (Signed)
Physical Therapy Treatment Patient Details Name: Eugene [name] MRN: [id number] DOB: [birth year] Today's Date: 04/02/2013 Time: 4098-1191 PT Time Calculation (min): 24 min  PT Assessment / Plan / Recommendation PT Comments   Pt admitted for aortogram and developed right groin hematoma.  Evaluated by PT who felt pt not safe to go home along.  Plan is for short term SNF to address patient's decr safety with RW, decr balance and decr endurance.  Pt in agreement.    Follow Up Recommendations  SNF;Supervision/Assistance - 24 hour                 Equipment Recommendations  None recommended by PT        Frequency Min 3X/week   Progress towards PT Goals Progress towards PT goals: Progressing toward goals  Plan Current plan remains appropriate    Precautions / Restrictions Precautions Precautions: Fall Precaution Comments: Repeated h/o falls Restrictions Weight Bearing Restrictions: No   Pertinent Vitals/Pain VSS, No pain    Mobility  Bed Mobility Bed Mobility: Not assessed Transfers Transfers: Sit to Stand;Stand to Sit Sit to Stand: 4: Min assist;With upper extremity assist;From toilet Stand to Sit: 4: Min assist;To chair/3-in-1;To toilet;With upper extremity assist Details for Transfer Assistance: Pt impulsive with transitioning from standing to sitting, not reaching back to the chair before sitting.   Ambulation/Gait Ambulation/Gait Assistance: 4: Min assist Ambulation Distance (Feet): 150 Feet Assistive device: Rolling walker Ambulation/Gait Assistance Details: Pt does not stay close enought to RW.  Needs cues constantly for safety with RW.  Poor upright posture as well.   Gait Pattern: Step-through pattern;Trunk flexed (decr coordination of bil LEs, poor foot clearance. ) Gait velocity: decreased Stairs: No Wheelchair Mobility Wheelchair Mobility: No    Exercises General Exercises - Lower Extremity Ankle Circles/Pumps: AROM;Both;10 reps;Seated Long Arc Quad:  AROM;Both;15 reps;Seated Hip Flexion/Marching: AROM;Both;15 reps;Seated    PT Goals (current goals can now be found in the care plan section) Acute Rehab PT Goals Patient Stated Goal: to go to a NH and then home  Visit Information  Last PT Received On: 04/02/13 Assistance Needed: +1    Subjective Data  Subjective: "I feel so weak." Patient Stated Goal: to go to a NH and then home   Cognition  Cognition Arousal/Alertness: Awake/alert Behavior During Therapy: WFL for tasks assessed/performed Overall Cognitive Status: Impaired/Different from baseline Area of Impairment: Safety/judgement;Awareness Current Attention Level: Selective Safety/Judgement: Decreased awareness of safety Awareness: Intellectual Problem Solving: Slow processing General Comments: Pt feels he does not really need therapy even though he has a history of falls frequently at home.  Poor awareness that his home is not safe with the current changes in levels with steps.      Balance  Static Standing Balance Static Standing - Balance Support: Right upper extremity supported;Left upper extremity supported Static Standing - Level of Assistance: 5: Stand by assistance;4: Min assist Static Standing - Comment/# of Minutes: 2 minutes with poor stability overall.    End of Session PT - End of Session Equipment Utilized During Treatment: Gait belt Activity Tolerance: Patient limited by fatigue Patient left: in chair;with call bell/phone within reach Nurse Communication: Mobility status        INGOLD,Bera Pinela 04/02/2013, 12:58 PM [facility] Acute Rehabilitation [phone] (pager)

## 2013-04-02 NOTE — Progress Notes (Signed)
Patient for d/c today to SNF bed at [facility].   POA and patient agreeable to this plan- plan transfer via EMS. Reece Levy, MSW, [facility] [phone]

## 2013-04-20 ENCOUNTER — Telehealth: Payer: Self-pay | Admitting: *Deleted

## 2013-04-20 NOTE — Telephone Encounter (Signed)
Discuss Protonix - cont to take. Home nurse saw pt today and told him same information. Stanton Kidney Malaney Mcbean RN 04/20/13 5PM

## 2013-04-28 ENCOUNTER — Encounter: Payer: Self-pay | Admitting: Vascular Surgery

## 2013-04-29 ENCOUNTER — Ambulatory Visit: Payer: Medicare Other | Admitting: Vascular Surgery

## 2013-04-29 ENCOUNTER — Encounter: Payer: Self-pay | Admitting: Vascular Surgery

## 2013-04-29 ENCOUNTER — Ambulatory Visit (INDEPENDENT_AMBULATORY_CARE_PROVIDER_SITE_OTHER): Payer: Medicare Other | Admitting: Vascular Surgery

## 2013-04-29 ENCOUNTER — Encounter (INDEPENDENT_AMBULATORY_CARE_PROVIDER_SITE_OTHER): Payer: Medicare Other | Admitting: *Deleted

## 2013-04-29 VITALS — BP 131/66 | HR 55 | Ht 67.0 in | Wt 205.0 lb

## 2013-04-29 DIAGNOSIS — I739 Peripheral vascular disease, unspecified: Secondary | ICD-10-CM

## 2013-04-29 DIAGNOSIS — L98499 Non-pressure chronic ulcer of skin of other sites with unspecified severity: Secondary | ICD-10-CM

## 2013-04-29 DIAGNOSIS — I70209 Unspecified atherosclerosis of native arteries of extremities, unspecified extremity: Secondary | ICD-10-CM

## 2013-04-29 NOTE — Progress Notes (Signed)
[facility] [place]  Patient name: Eugene [name] MRN: [id number] DOB: 1923-12-26 Sex: male  REASON FOR VISIT: follow up after left anterior tibial artery PTA.  HPI: Eugene [name] is a 79 y.o. male who presented with a nonhealing wound of his left great toe. He is 27 with significant cardiac history. He underwent an arteriogram and was found to have a 95% stenosis of the proximal left anterior tibial artery which underwent successful PTA. He did have some hematoma in the right groin stayed overnight for observation. He comes in for a wound check. The wound on his left great toe appears to be improving.  REVIEW OF SYSTEMS: Arly.Keller ] denotes positive finding; [  ] denotes negative finding  CARDIOVASCULAR:  [ ]  chest pain   [ ]  dyspnea on exertion    CONSTITUTIONAL:  [ ]  fever   [ ]  chills  PHYSICAL EXAM: Filed Vitals:   04/29/13 1551  BP: 131/66  Pulse: 55  Height: 5\' 7"  (1.702 m)  Weight: 205 lb (92.987 kg)  SpO2: 96%   Body mass index is 32.1 kg/(m^2). GENERAL: The patient is a well-nourished male, in no acute distress. The vital signs are documented above. CARDIOVASCULAR: There is a regular rate and rhythm  PULMONARY: There is good air exchange bilaterally without wheezing or rales. The wound on his left great toe appears to be improving. There is no swelling or ecchymosis in the right groin where he was cathed.  Arterial Doppler study today shows an ABI of 100% with a biphasic posterior tibial signal and a triphasic anterior tibial signal.  MEDICAL ISSUES: Overall pleased with his progress and hopefully he should have adequate circulation to heal this wound at this point. He has a superficial eschar on the tip of his great toe which is almost healed see him back in 3 months. He knows to call sooner if he has problems.  Rasheedah Reis S [facility] [place] Beeper: [phone]

## 2013-05-01 ENCOUNTER — Other Ambulatory Visit: Payer: Self-pay | Admitting: *Deleted

## 2013-05-01 MED ORDER — PANTOPRAZOLE SODIUM 40 MG PO TBEC: 40.0000 mg | DELAYED_RELEASE_TABLET | Freq: Every day | ORAL | Status: DC

## 2013-05-20 ENCOUNTER — Other Ambulatory Visit: Payer: Self-pay | Admitting: Cardiology

## 2013-05-20 NOTE — Telephone Encounter (Signed)
Fax Received. Refill Completed. Eugene [name] (R.M.A)   

## 2013-05-22 ENCOUNTER — Other Ambulatory Visit: Payer: Self-pay | Admitting: *Deleted

## 2013-05-22 MED ORDER — LOSARTAN POTASSIUM 50 MG PO TABS: 50.0000 mg | ORAL_TABLET | Freq: Every day | ORAL | Status: DC

## 2013-05-27 ENCOUNTER — Other Ambulatory Visit: Payer: Self-pay | Admitting: Internal Medicine

## 2013-06-02 ENCOUNTER — Ambulatory Visit (INDEPENDENT_AMBULATORY_CARE_PROVIDER_SITE_OTHER): Payer: Medicare Other | Admitting: Cardiovascular Disease

## 2013-06-02 ENCOUNTER — Encounter: Payer: Self-pay | Admitting: Cardiovascular Disease

## 2013-06-02 VITALS — BP 142/86 | HR 65 | Ht 67.0 in | Wt 209.0 lb

## 2013-06-02 DIAGNOSIS — I350 Nonrheumatic aortic (valve) stenosis: Secondary | ICD-10-CM

## 2013-06-02 DIAGNOSIS — I359 Nonrheumatic aortic valve disorder, unspecified: Secondary | ICD-10-CM

## 2013-06-02 NOTE — Progress Notes (Signed)
HPI:   Eugene [name] returns for follow-up evaluation. He is followed for CAD and aortic stenosis. The patient also has PAD and has undergone PTA by Dr Edilia Bo for treatment of nonhealing foot ulcers.   He presented in January 2013 with syncope and was noted to have progressive and severe aortic stenosis. He underwent balloon aortic valvuloplasty as a palliative therapy and also was treated with stenting of the proximal LAD.  He continues to live independently but requires significant support. He is here with his friend today who provides a great deal of care for him. The patient's main complaints are related to gait unsteadiness and generalized weakness. He has chronic DOE which has not significantly changes, but occurs with modest activities such as walking to his mailbox. He denies chest pain, chest pressure, or lightheadedness/syncope.  Outpatient Encounter Prescriptions as of 06/02/2013  Medication Sig Dispense Refill  . amiodarone (PACERONE) 200 MG tablet Take 200 mg by mouth daily.      Marland Kitchen amLODipine (NORVASC) 5 MG tablet Take 5 mg by mouth daily.      Marland Kitchen aspirin EC 81 MG tablet Take 81 mg by mouth daily.      . Calcium Carbonate-Vitamin D (CALCIUM + D PO) Take 1 tablet by mouth daily.       . carvedilol (COREG) 6.25 MG tablet TAKE 1 TABLET (6.25 MG TOTAL) BY MOUTH 2 (TWO) TIMES DAILY.  60 tablet  5  . docusate sodium (COLACE) 100 MG capsule Take 100 mg by mouth 2 (two) times daily.       . ferrous sulfate 325 (65 FE) MG tablet Take 325 mg by mouth daily with breakfast.      . furosemide (LASIX) 40 MG tablet Take 40 mg by mouth every morning.      Marland Kitchen levothyroxine (LEVOTHROID) 25 MCG tablet Take 1 tablet (25 mcg total) by mouth daily.  30 tablet  3  . losartan (COZAAR) 50 MG tablet Take 1 tablet (50 mg total) by mouth daily.  30 tablet  0  . loteprednol (LOTEMAX) 0.5 % ophthalmic suspension Place 1 drop into the right eye every morning.       . pantoprazole (PROTONIX) 40 MG tablet Take 1  tablet (40 mg total) by mouth daily.  90 tablet  2  . pravastatin (PRAVACHOL) 40 MG tablet Take 40 mg by mouth every evening.      Marland Kitchen spironolactone (ALDACTONE) 25 MG tablet Take 12.5 mg by mouth every morning.      . [DISCONTINUED] amLODipine (NORVASC) 5 MG tablet TAKE 1 TABLET EVERY DAY  30 tablet  1  . [DISCONTINUED] magnesium hydroxide (MILK OF MAGNESIA) 400 MG/5ML suspension Take 30 mLs by mouth daily as needed. For constipation      . [DISCONTINUED] travoprost, benzalkonium, (TRAVATAN) 0.004 % ophthalmic solution Place 1 drop into the right eye at bedtime.        No facility-administered encounter medications on file as of 06/02/2013.    Allergies  Allergen Reactions  . Penicillins Swelling    REACTION: "turns red"    Past Medical History  Diagnosis Date  . Glaucoma     Right eye  . Diverticulosis     multiple hospital admissions for GI bleed, has an episode roughly every 6 months,, last  colonscopy in 2006 showed diverticulosis, was evaluated by Dr. Madilyn Fireman in the hospital in 2011 for lower GI bleed andd it  was suggested that the patient was not actively bleeding at that time and  given his comorbid conditions colonoscopy was deferred. He is also chronically constipated  . Macular degeneration of right eye   . Allergic rhinitis   . CAD (coronary artery disease)     s/p PCI in 1991;  Cardiac catheterization 11/02/11: Ostial LAD 95% with aneurysmal dilatation after this, proximal circumflex 70% with a superior branch 30-40%, proximal RCA 75% and 90%, mid RCA 50% and 90% before a PDA, then occluded.;    high risk for CABG;PCI 11/14/11: Bare-metal stent to the proximal LAD    . OSA on CPAP   . Hypertension   . Hyperlipidemia   . Chronic systolic heart failure     Echocardiogram 11/02/11: Moderate LVH, EF 30-35%, multiple wall motion abnormalities, critical aortic stenosis, AVA 0.6, mean gradient 45, mild Eugene, severe LAE  . Supraventricular tachycardia     2 syndromes-nonsustained atrial  tachycardia//adenosine responsive diuretic positive reentry probably AV node reentry  . Aortic stenosis     s/p AV balloon valvuloplasty by Dr. Excell Seltzer 11/2011  . Ischemic cardiomyopathy   . H/O: GI bleed     recurrent  . Heart murmur   . CHF (congestive heart failure)   . Myocardial infarction 10/28/2011  . Pneumonia 2012  . Exertional shortness of breath   . Hypothyroidism   . GERD (gastroesophageal reflux disease)   . Arthritis     "might have slight; have trouble w/my fingers" (03/30/2013)    ROS: Negative except as per HPI  BP 142/86  Pulse 65  Ht 5\' 7"  (1.702 m)  Wt 209 lb (94.802 kg)  BMI 32.73 kg/m2  SpO2 94%  PHYSICAL EXAM: Pt is alert and oriented, pleasant elderly male in NAD HEENT: normal Neck: JVP - normal, carotids delayed bilaterally Lungs: CTA bilaterally CV: RRR with grade 3/6 harsh systolic murmur at the RUSB Abd: soft, NT, Positive BS, no hepatomegaly Ext: no C/C/E, distal pulses intact and equal Skin: warm/dry no rash  EKG:  Sinus rhythm with first-degree AV block, heart rate 65 beats per minute.  Echocardiogram 03/13/2013: Left ventricle: The cavity size was normal. Wall thickness was increased in a pattern of moderate LVH. Systolic function was normal. The estimated ejection fraction was in the range of 60% to 65%. Wall motion was normal; there were no regional wall motion abnormalities. There was an increased relative contribution of atrial contraction to ventricular filling. Early diastolic septal annular tissue Doppler velocities Ea were abnormal. Doppler parameters are consistent with abnormal left ventricular relaxation (grade 1 diastolic dysfunction).  ------------------------------------------------------------ Aortic valve: Moderately thickened, severely calcified leaflets. Doppler: There was severe stenosis. Trivial regurgitation. VTI ratio of LVOT to aortic valve: 0.2. Valve area: 0.76cm^2(VTI). Indexed valve area: 0.38cm^2/m^2 (VTI).  Peak velocity ratio of LVOT to aortic valve: 0.2. Valve area: 0.78cm^2 (Vmax). Indexed valve area: 0.39cm^2/m^2 (Vmax). Mean gradient: 50mm Hg (S). Peak gradient: 77mm Hg (S).  ------------------------------------------------------------ Mitral valve: Doppler: Trivial regurgitation.  ------------------------------------------------------------ Left atrium: The atrium was mildly dilated.  ------------------------------------------------------------ Right ventricle: The cavity size was normal. Systolic function was normal.  ------------------------------------------------------------ Pulmonic valve: Doppler: Mild regurgitation.  ------------------------------------------------------------ Tricuspid valve: Doppler: Trivial regurgitation.  ------------------------------------------------------------ Right atrium: The atrium was mildly dilated.  ------------------------------------------------------------ Pericardium: There was no pericardial effusion.  ------------------------------------------------------------  2D measurements Normal Doppler measurements Normal Left ventricle Left ventricle LVID ED, 49.5 mm 43-52 Ea, lat 3.95 cm/s ------ chord, ann, tiss PLAX DP LVID ES, 43.2 mm 23-38 E/Ea, lat 17.7 ------ chord, ann, tiss 5 PLAX DP FS, chord, 13 % >29 Ea, med 4.17  cm/s ------ PLAX ann, tiss LVPW, ED 14.6 mm ------ DP IVS/LVPW 1.23 <1.3 E/Ea, med 16.8 ------ ratio, ED ann, tiss 1 Ventricular septum DP IVS, ED 17.9 mm ------ LVOT LVOT Peak vel, 89.7 cm/s ------ Diam, S 22 mm ------ S Area 3.8 cm^2 ------ VTI, S 23.3 cm ------ Diam 22 mm ------ Stroke vol 88.6 ml ------ Aorta Stroke 44.1 ml/m^2 ------ Root diam, 38 mm ------ index ED Aortic valve Left atrium Peak vel, 439 cm/s ------ AP dim 54 mm ------ S AP dim 2.69 cm/m^2 <2.2 Mean vel, 329 cm/s ------ index S VTI, S 117 cm ------ Mean 50 mm Hg ------ gradient, S Peak 77 mm Hg ------ gradient, S VTI ratio  0.2 ------ LVOT/AV Area, VTI 0.76 cm^2 ------ Area index 0.38 cm^2/m ------ (VTI) ^2 Peak vel 0.2 ------ ratio, LVOT/AV Area, Vmax 0.78 cm^2 ------ Area index 0.39 cm^2/m ------ (Vmax) ^2 Mitral valve Peak E vel 70.1 cm/s ------ Peak A vel 101 cm/s ------ Decelerati 430 ms 150-23 on time 0 Peak E/A 0.7 ------ ratio Right ventricle Sa vel, 12.5 cm/s ------ lat ann, tiss DP  ASSESSMENT AND PLAN: 79 year-old gentleman with severe symptomatic aortic stenosis and CAD with hx of remote inferior MI and severe LAD stenosis treated last year with a bare-metal stent in the prox LAD.   I had a lengthy discussion with the patient and hhis friend today about treatment options for his aortic stenosis. This discussion occurred in the context of his advanced age and comorbid medical conditions. He is at high/prohibitive risk of surgery, primarily because of his physical disability and very poor mobility which would place him at very high risk of a prolonged and difficult recovery. He has undergone extensive workup for TAVR and has anatomic features compatible with a 29 mm Sapien valve. We discussed the details of TAVR, including expectations for clinical benefit, recovery, and potential complications as well as the risks of these complications. This was contrasted with palliative medical therapy which is his other treatment option.   The patient and his caregiver report that each procedure/hospitaization over the past few years has resulted in more physical limitation and disability. They are not eager to pursue further therapies at this point and understand that the prognosis of untreated severe aortic stenosis is poor with expectation of congestive heart failure and death within the next few years. Consider he is not particularly limited by cardiopulmonary symptoms at this point, they prefer an approach of medical therapy with follow-up evaluation in 6 months which I will arrange. No changes were made  to his medical program today.  Greater than 45 minutes was spent in evaluation today, greater than half of that time in face-to-face discussion related to medical problems as outlined above.  Eugene [name] 06/07/2013 12:06 PM

## 2013-06-02 NOTE — Patient Instructions (Addendum)
Your physician wants you to follow-up in: 6 MONTHS with Dr Cooper.  You will receive a reminder letter in the mail two months in advance. If you don't receive a letter, please call our office to schedule the follow-up appointment.  Your physician recommends that you continue on your current medications as directed. Please refer to the Current Medication list given to you today.  

## 2013-06-07 ENCOUNTER — Encounter: Payer: Self-pay | Admitting: Cardiovascular Disease

## 2013-06-18 ENCOUNTER — Other Ambulatory Visit: Payer: Self-pay | Admitting: Internal Medicine

## 2013-06-24 ENCOUNTER — Other Ambulatory Visit: Payer: Self-pay | Admitting: Internal Medicine

## 2013-06-25 ENCOUNTER — Ambulatory Visit (INDEPENDENT_AMBULATORY_CARE_PROVIDER_SITE_OTHER): Payer: Medicare Other | Admitting: Internal Medicine

## 2013-06-25 ENCOUNTER — Encounter: Payer: Self-pay | Admitting: Internal Medicine

## 2013-06-25 VITALS — BP 125/73 | HR 56 | Temp 97.3°F | Ht <= 58 in | Wt 206.8 lb

## 2013-06-25 DIAGNOSIS — Z9181 History of falling: Secondary | ICD-10-CM

## 2013-06-25 DIAGNOSIS — I10 Essential (primary) hypertension: Secondary | ICD-10-CM

## 2013-06-25 DIAGNOSIS — I509 Heart failure, unspecified: Secondary | ICD-10-CM

## 2013-06-25 DIAGNOSIS — I359 Nonrheumatic aortic valve disorder, unspecified: Secondary | ICD-10-CM

## 2013-06-25 DIAGNOSIS — I350 Nonrheumatic aortic (valve) stenosis: Secondary | ICD-10-CM

## 2013-06-25 DIAGNOSIS — R296 Repeated falls: Secondary | ICD-10-CM

## 2013-06-25 DIAGNOSIS — Z23 Encounter for immunization: Secondary | ICD-10-CM

## 2013-06-25 MED ORDER — ZOSTER VACCINE LIVE 19400 UNT/0.65ML ~~LOC~~ SOLR: 0.6500 mL | Freq: Once | SUBCUTANEOUS | Status: DC

## 2013-06-25 NOTE — Patient Instructions (Addendum)
Please follow physical therapy instructions. Make sure you get up slowly. Always use your walker. Return in 3 months. Please let me know if his mental status changes (confused, seems slower). Shingles vaccine sent to the pharmacy (they can give him this). Flu vaccine today.

## 2013-06-25 NOTE — Progress Notes (Signed)
  Subjective:    Patient ID: Eugene [name], male    DOB: 09-23-1924, 79 y.o.   MRN: [id number]  HPI Presents with his caretaker today.  He is SOB with minimal exertion.  Denies CP.  Sleeps well, not taking up with episodes of PND or worsening orthopnea. He has fallen 3 times since the last time I saw him.  He did attend physical therapy, but per his caretaker.  He is not having episodes of LOC. Per his caretaker, he is not following the advice of PT and taking steps that are too big.  He fell and hit his head on Saturday. Denies any HA at this time.    Review of Systems  Constitutional: Negative for fever, activity change, appetite change and fatigue.  HENT: Negative for trouble swallowing, neck stiffness and voice change.   Eyes: Negative for photophobia, discharge, itching and visual disturbance.  Respiratory: Positive for shortness of breath. Negative for apnea, cough, chest tightness, wheezing and stridor.   Cardiovascular: Positive for leg swelling. Negative for chest pain and palpitations.  Gastrointestinal: Negative for nausea, vomiting, diarrhea and abdominal distention.  Endocrine: Negative for polyuria.  Genitourinary: Negative for dysuria, hematuria and difficulty urinating.  Neurological: Negative for dizziness, syncope, speech difficulty, weakness and headaches.  Psychiatric/Behavioral: Negative for agitation.       Objective:   Physical Exam  Constitutional: He is oriented to person, place, and time. He appears well-nourished. No distress.  HENT:  2x3 cm area of echymosis of occiput  Eyes: Conjunctivae and EOM are normal. Pupils are equal, round, and reactive to light.  Neck: Normal range of motion. Neck supple. No JVD present. No thyromegaly present.  Cardiovascular: Normal rate, regular rhythm, S1 normal, S2 normal and normal pulses.  Exam reveals no gallop and no friction rub.   Murmur heard.  Crescendo decrescendo systolic murmur is present with a grade of 4/6   Pulmonary/Chest: Effort normal and breath sounds normal.  Abdominal: Soft. Bowel sounds are normal. He exhibits no distension. There is no tenderness.  Musculoskeletal: He exhibits edema.  Neurological: He is alert and oriented to person, place, and time. No cranial nerve deficit. Coordination normal.  Skin: Skin is warm. He is not diaphoretic.  Psychiatric: He has a normal mood and affect.        Assessment & Plan:  79 yr old male w/ hx of severe aortic stenosis (followed by Dr. Excell Seltzer), CAD s/p BMS to proximal LAD, hx NSTEMI, recently s/p PTA of left anterior tibial artery in 03/2013, HTN, HL, hx non-healing foot ulcers, presents for follow up. 1) Recurrent falls: This is his main issue. He has attended PT. His caretaker states his falls are a result of him not following their directions. He feels with reminders he will do fine. I had a long discussion with the patient. He is ok with this plan. He needs to continue using his walker.   His recent fall resulted in him hitting his head. He will need to be monitored for any mental status changes, his neurological exam is normal today. I discussed this with his caretaker. No need for CT head at this time. 2) Severe AS: Managed by cardiology. 3) Health Maintenance: Flu vaccine today. Interested in Zostavax. -return in 3 months.  Jonah Blue, DO, FACP [facility] 06/26/2013, 1:54 PM

## 2013-07-23 ENCOUNTER — Other Ambulatory Visit: Payer: Self-pay | Admitting: Internal Medicine

## 2013-07-30 ENCOUNTER — Ambulatory Visit (INDEPENDENT_AMBULATORY_CARE_PROVIDER_SITE_OTHER): Payer: Medicare Other | Admitting: Internal Medicine

## 2013-07-30 VITALS — BP 101/75 | HR 60

## 2013-07-30 DIAGNOSIS — N3941 Urge incontinence: Secondary | ICD-10-CM

## 2013-07-30 DIAGNOSIS — E039 Hypothyroidism, unspecified: Secondary | ICD-10-CM

## 2013-07-30 DIAGNOSIS — R269 Unspecified abnormalities of gait and mobility: Secondary | ICD-10-CM

## 2013-07-30 DIAGNOSIS — R229 Localized swelling, mass and lump, unspecified: Secondary | ICD-10-CM

## 2013-07-30 DIAGNOSIS — R252 Cramp and spasm: Secondary | ICD-10-CM

## 2013-07-30 DIAGNOSIS — E538 Deficiency of other specified B group vitamins: Secondary | ICD-10-CM

## 2013-07-30 LAB — MAGNESIUM: Magnesium: 2.2 mg/dL (ref 1.5–2.5)

## 2013-07-30 LAB — BASIC METABOLIC PANEL
BUN: 30 mg/dL — ABNORMAL HIGH (ref 6–23)
Calcium: 9.6 mg/dL (ref 8.4–10.5)
Creat: 1.25 mg/dL (ref 0.50–1.35)

## 2013-07-30 LAB — TSH: TSH: 2.758 u[IU]/mL (ref 0.350–4.500)

## 2013-07-30 LAB — VITAMIN B12: Vitamin B-12: 195 pg/mL — ABNORMAL LOW (ref 211–911)

## 2013-07-30 MED ORDER — OXYBUTYNIN CHLORIDE ER 5 MG PO TB24: 5.0000 mg | ORAL_TABLET | Freq: Every day | ORAL | Status: DC

## 2013-07-30 MED ORDER — ZOSTER VACCINE LIVE 19400 UNT/0.65ML ~~LOC~~ SOLR: 0.6500 mL | Freq: Once | SUBCUTANEOUS | Status: DC

## 2013-07-30 MED ORDER — TERAZOSIN HCL 1 MG PO CAPS: 1.0000 mg | ORAL_CAPSULE | Freq: Every day | ORAL | Status: DC

## 2013-07-30 NOTE — Progress Notes (Signed)
HPI The patient is a 79 y.o. male with a history of severe AS, chronic venous insufficiency, macular degeneration, and frequent falls, presenting for re-evaluation for frequent falls.  The patient has a history of frequent falls.  At OV 9/18, it was noted that the patient was having frequent falls, which his caretaker attributed to not following the advice of PT and taking steps that were "too big".  Since that time, the patient's caregiver believes his falls are becoming more frequent, with 2 falls since that visit.  The patient describes one episode when he tried to sit in a chair but "missed" the chair (misjudged the position of the chair in space).  In another episode, he tried to get out of bed but slid to the floor.  The patient notes feelings of "poor balance" when walking.  The patient notes some stable DOE.  The patient notes poor vision, particularly in his R eye (history of glaucoma, macular degeneration), next eye appointment December.  The patient last saw PT a few weeks ago, and notes always walking with a walker.  The patient's caretaker believes there may be a cognitive (memory, understanding issues) component to these falls.  He notes no dizziness, lightheadedness.   The patient notes urinary incontinence, present for the last 4 years, though worsening lately.  He describes this as having a sudden urge to go to the bathroom, and having to rush to get to the bathroom, sometimes with dribbling of urine if he is unable to get there in time. He notes no loss of urine with coughing or laughing.  The patient is on lasix, which he states at times can make him go to the bathroom every 15 minutes.  He notes occasional difficulty starting urination, but notes a strong stream, and no dysuria.  The patient notes a "knot" on the front of his head, x1 week.  He notes that the area drained some fluid, and since then it has decreased in size.  He denies hitting this area during one of his falls.  The  patient notes a "cramping" sensation in his left leg, which typically only occurs at night, and improves with position.  The patient has a history of severe AS.  He has discussed with cardiology options of TAVR vs medical management/palliative care, and has opted against TAVR for now.  ROS: General: no fevers, chills, changes in weight, changes in appetite Skin: no rash HEENT: no hearing changes, sore throat Pulm: no dyspnea, coughing, wheezing CV: no chest pain, palpitations, shortness of breath Abd: no abdominal pain, nausea/vomiting, diarrhea/constipation GU: no dysuria, hematuria, polyuria Ext: see HPI Neuro: no weakness, numbness, or tingling  Filed Vitals:   07/31/13 1211  BP: 101/75  Pulse: 60    PEX General: alert, cooperative, and in no apparent distress HEENT: pupils equal round and reactive to light, vision grossly intact, oropharynx clear and non-erythematous. Left forehead with small, firm, raised area, with healed punctum. Neck: supple Lungs: clear to ascultation bilaterally, normal work of respiration, no wheezes, rales, ronchi Heart: regular rate and rhythm Abdomen: soft, non-tender, non-distended, normal bowel sounds Extremities: no cyanosis, clubbing, or edema. Left leg with no ttp Neurologic: alert & oriented X3, cranial nerves II-XII grossly intact, strength grossly intact, sensation intact to light touch  Current Outpatient Prescriptions on File Prior to Visit  Medication Sig Dispense Refill  . amiodarone (PACERONE) 200 MG tablet Take 200 mg by mouth daily.      Marland Kitchen amLODipine (NORVASC) 5 MG tablet Take 5  mg by mouth daily.      Marland Kitchen amLODipine (NORVASC) 5 MG tablet TAKE 1 TABLET EVERY DAY  30 tablet  2  . aspirin EC 81 MG tablet Take 81 mg by mouth daily.      . Calcium Carbonate-Vitamin D (CALCIUM + D PO) Take 1 tablet by mouth daily.       . carvedilol (COREG) 6.25 MG tablet TAKE 1 TABLET (6.25 MG TOTAL) BY MOUTH 2 (TWO) TIMES DAILY.  60 tablet  5  . docusate  sodium (COLACE) 100 MG capsule Take 100 mg by mouth 2 (two) times daily.       . ferrous sulfate 325 (65 FE) MG tablet Take 325 mg by mouth daily with breakfast.      . furosemide (LASIX) 40 MG tablet Take 40 mg by mouth every morning.      Marland Kitchen levothyroxine (SYNTHROID, LEVOTHROID) 25 MCG tablet TAKE 1 TABLET (25 MCG TOTAL) BY MOUTH DAILY.  30 tablet  3  . losartan (COZAAR) 50 MG tablet TAKE 1 TABLET (50 MG TOTAL) BY MOUTH DAILY.  30 tablet  3  . loteprednol (LOTEMAX) 0.5 % ophthalmic suspension Place 1 drop into the right eye every morning.       . pantoprazole (PROTONIX) 40 MG tablet Take 1 tablet (40 mg total) by mouth daily.  90 tablet  2  . pravastatin (PRAVACHOL) 40 MG tablet Take 40 mg by mouth every evening.      Marland Kitchen spironolactone (ALDACTONE) 25 MG tablet Take 12.5 mg by mouth every morning.      . zoster vaccine live, PF, (ZOSTAVAX) 14782 UNT/0.65ML injection Inject 19,400 Units into the skin once.  1 each  0   No current facility-administered medications on file prior to visit.    Assessment/Plan

## 2013-07-30 NOTE — Patient Instructions (Addendum)
General Instructions: For your falls, we are checking a thyroid level and a vitamin B12 level today.  If either of these are abnormal, I will contact you.  If you do not hear from me, that means these were normal. -we can continue to try additional rounds of Physical Therapy in the future  For your urinary incontinence, we are trying the medication Terazosin.  Take 1 tablet once per day, before bed.  If this is not helpful within the first week, you can stop this medication. -other things to try include going to the bathroom on a schedule, and locating the nearest bathroom whenever you enter a building.  For your leg cramps, we are checking your potassium and magnesium levels today.  Please return for a follow-up visit in 1-2 months.   Treatment Goals:  Goals (1 Years of Data) as of 07/30/13         06/25/13 06/02/13 04/29/13 03/31/13 03/30/13     Lifestyle    . Family to eat at dinner table more often           Weight    . Weight < 200 lb (90.719 kg)  206 lb 12.8 oz (93.804 kg) 209 lb (94.802 kg) 205 lb (92.987 kg) 202 lb 9.6 oz (91.9 kg) 198 lb (89.812 kg)      Progress Toward Treatment Goals:  Treatment Goal 07/30/2013  Blood pressure at goal  Prevent falls deteriorated    Self Care Goals & Plans:  Self Care Goal 06/25/2013  Manage my medications take my medicines as prescribed; bring my medications to every visit; refill my medications on time  Monitor my health -  Eat healthy foods drink diet soda or water instead of juice or soda; eat more vegetables; eat foods that are low in salt; eat baked foods instead of fried foods  Be physically active -  Prevent falls -    No flowsheet data found.   Care Management & Community Referrals:  Referral 07/30/2013  Referrals made for care management support none needed  Referrals made to community resources -

## 2013-07-31 ENCOUNTER — Telehealth: Payer: Self-pay | Admitting: *Deleted

## 2013-07-31 ENCOUNTER — Encounter: Payer: Self-pay | Admitting: Internal Medicine

## 2013-07-31 DIAGNOSIS — R252 Cramp and spasm: Secondary | ICD-10-CM | POA: Insufficient documentation

## 2013-07-31 DIAGNOSIS — N3941 Urge incontinence: Secondary | ICD-10-CM | POA: Insufficient documentation

## 2013-07-31 DIAGNOSIS — E538 Deficiency of other specified B group vitamins: Secondary | ICD-10-CM | POA: Insufficient documentation

## 2013-07-31 DIAGNOSIS — R229 Localized swelling, mass and lump, unspecified: Secondary | ICD-10-CM | POA: Insufficient documentation

## 2013-07-31 MED ORDER — CYANOCOBALAMIN 1000 MCG/ML IJ SOLN
1000.0000 ug | Freq: Every day | INTRAMUSCULAR | Status: AC
  Administered 2013-08-10 – 2013-08-12 (×3): 1000 ug via INTRAMUSCULAR

## 2013-07-31 MED ORDER — SPIRONOLACTONE 25 MG PO TABS: 12.5000 mg | ORAL_TABLET | ORAL | Status: DC

## 2013-07-31 MED ORDER — CYANOCOBALAMIN 1000 MCG/ML IJ SOLN: 1000.0000 ug | INTRAMUSCULAR | Status: AC

## 2013-07-31 NOTE — Assessment & Plan Note (Signed)
The patient notes frequent falls, which seemed to be getting more frequent. I believe the etiology of this is multifactorial, with components of poor vision (macular degeneration), deconditioning, and possible components of symptomatic aortic stenosis. The patient has gone through several rounds of physical therapy, finishing 1 within the last month. -Will check TSH and vitamin B12 as other potential contributors -Patient has eye exam in December -Can pursue further rounds of physical therapy at future visits as needed

## 2013-07-31 NOTE — Assessment & Plan Note (Signed)
Addendum: Vit B12 levels were low.  Will start Vit B12 injections daily x5 days (starting 11/3), then weekly x3 weeks, then will change to vitamin B12 tablets daily.

## 2013-07-31 NOTE — Assessment & Plan Note (Signed)
There is a small, firm, raised area on the patient's left forehead with a punctum that appears to have healed. I believe this likely represented an abscess, which drained on its own, with no residual evidence of infection. -No further treatment indicated

## 2013-07-31 NOTE — Assessment & Plan Note (Signed)
The patient has symptoms of urge urinary incontinence. This may be due to BPH versus difficulty getting to the bathroom in time because of mobility issues. CT of the head 10 months ago did not show signs of NPH. Oxybutynin contraindicated due to glaucoma. -Will start Terazosin for BPH, to encourage complete emptying of bladder, and prevent frequent trips to the bathroom (to take at night, to minimize symptoms of orthostatic hypotension)

## 2013-07-31 NOTE — Addendum Note (Signed)
Addended by: Linward Headland on: 07/31/2013 04:33 PM   Modules accepted: Orders

## 2013-07-31 NOTE — Telephone Encounter (Signed)
Pt needs immunization order sent to pharm

## 2013-07-31 NOTE — Assessment & Plan Note (Addendum)
The patient notes left leg cramping sensation, which occurs only at night when lying down, and improves with sitting up.  Differential includes spinal stenosis vs electrolyte abnormality. -check BMET, Mg today  Addendum: K high at 5.6.  Will decrease spironolactone to 12.5 mg every other day for now, recheck at next visit.

## 2013-08-02 ENCOUNTER — Other Ambulatory Visit: Payer: Self-pay | Admitting: Internal Medicine

## 2013-08-02 NOTE — Telephone Encounter (Signed)
Eugene [name], which immunization order? Shingles vaccine?

## 2013-08-03 NOTE — Progress Notes (Signed)
Case discussed with Dr. Brown at the time of the visit.  We reviewed the resident's history and exam and pertinent patient test results.  I agree with the assessment, diagnosis, and plan of care documented in the resident's note. 

## 2013-08-04 MED ORDER — ZOSTER VACCINE LIVE 19400 UNT/0.65ML ~~LOC~~ SOLR: 0.6500 mL | Freq: Once | SUBCUTANEOUS | Status: DC

## 2013-08-04 NOTE — Addendum Note (Signed)
Addended by: Linward Headland on: 08/04/2013 04:41 PM   Modules accepted: Orders

## 2013-08-04 NOTE — Telephone Encounter (Signed)
Thank you for this note.  Looking back, I had sent the shingles vaccine to the patient's pharmacy at our last office visit on 10/23.  However, since the patient is reporting it was not received, I went ahead and re-ordered this today.  Myriam Jacobson, could you call the pharmacy to follow-up that it went through?  Thank you.

## 2013-08-04 NOTE — Telephone Encounter (Signed)
Yes, in looking at the office note it is the shingles vaccine

## 2013-08-10 ENCOUNTER — Ambulatory Visit: Payer: Medicare Other | Admitting: *Deleted

## 2013-08-11 ENCOUNTER — Ambulatory Visit: Payer: Medicare Other | Admitting: *Deleted

## 2013-08-11 ENCOUNTER — Encounter: Payer: Self-pay | Admitting: Vascular Surgery

## 2013-08-12 ENCOUNTER — Encounter: Payer: Self-pay | Admitting: Vascular Surgery

## 2013-08-12 ENCOUNTER — Ambulatory Visit: Payer: Medicare Other | Admitting: *Deleted

## 2013-08-12 ENCOUNTER — Ambulatory Visit: Payer: Medicare Other

## 2013-08-12 ENCOUNTER — Ambulatory Visit (INDEPENDENT_AMBULATORY_CARE_PROVIDER_SITE_OTHER): Payer: Medicare Other | Admitting: Vascular Surgery

## 2013-08-12 VITALS — BP 142/67 | HR 57 | Ht <= 58 in | Wt 212.0 lb

## 2013-08-12 DIAGNOSIS — I70209 Unspecified atherosclerosis of native arteries of extremities, unspecified extremity: Secondary | ICD-10-CM

## 2013-08-12 DIAGNOSIS — I739 Peripheral vascular disease, unspecified: Secondary | ICD-10-CM

## 2013-08-12 DIAGNOSIS — L98499 Non-pressure chronic ulcer of skin of other sites with unspecified severity: Secondary | ICD-10-CM

## 2013-08-12 NOTE — Progress Notes (Signed)
[facility]  Patient name: Eugene [name] MRN: [id number] DOB: 14-May-1924 Sex: male  REASON FOR VISIT: Follow up of peripheral vascular disease.  HPI: Eugene [name] is a 79 y.o. male who had presented with a nonhealing wound of his left great toe. He underwent an arteriogram and was found have a 95% stenosis of the proximal left anterior tibial artery. He underwent successful PTA of this stenosis. He did have a hematoma in his right groin incision overnight for observation. He was last seen in the office on 04/29/2013. At that time, arterial Doppler study showed an ABI of 100% on the left with biphasic posterior tibial signal and a triphasic anterior tibial signal. The wound on the toe had improved significantly. He was set up for 3 month follow up visit.  Since I saw him last, he denies any claudication or rest pain. He has had some cramps in his thighs at night but this improved after he cut back on his potassium. The wound on his left great toe healed completely.   Past Medical History  Diagnosis Date  . Glaucoma     Right eye  . Diverticulosis     multiple hospital admissions for GI bleed, has an episode roughly every 6 months,, last  colonscopy in 2006 showed diverticulosis, was evaluated by Dr. Madilyn Fireman in the hospital in 2011 for lower GI bleed andd it  was suggested that the patient was not actively bleeding at that time and given his comorbid conditions colonoscopy was deferred. He is also chronically constipated  . Macular degeneration of right eye   . Allergic rhinitis   . CAD (coronary artery disease)     s/p PCI in 1991;  Cardiac catheterization 11/02/11: Ostial LAD 95% with aneurysmal dilatation after this, proximal circumflex 70% with a superior branch 30-40%, proximal RCA 75% and 90%, mid RCA 50% and 90% before a PDA, then occluded.;    high risk for CABG;PCI 11/14/11: Bare-metal stent to the proximal LAD    . OSA on CPAP   . Hypertension   .  Hyperlipidemia   . Chronic systolic heart failure     Echocardiogram 11/02/11: Moderate LVH, EF 30-35%, multiple wall motion abnormalities, critical aortic stenosis, AVA 0.6, mean gradient 45, mild MR, severe LAE  . Supraventricular tachycardia     2 syndromes-nonsustained atrial tachycardia//adenosine responsive diuretic positive reentry probably AV node reentry  . Aortic stenosis     s/p AV balloon valvuloplasty by Dr. Excell Seltzer 11/2011  . Ischemic cardiomyopathy   . H/O: GI bleed     recurrent  . Heart murmur   . CHF (congestive heart failure)   . Myocardial infarction 10/28/2011  . Pneumonia 2012  . Exertional shortness of breath   . Hypothyroidism   . GERD (gastroesophageal reflux disease)   . Arthritis     "might have slight; have trouble w/my fingers" (03/30/2013)   Family History  Problem Relation Age of Onset  . Cancer Mother   . Cancer Father   . Heart disease Sister     Heart disease before age 42   SOCIAL HISTORY: History  Substance Use Topics  . Smoking status: Former Smoker -- 3.00 packs/day for 21 years    Types: Cigarettes    Quit date: 10/08/1958  . Smokeless tobacco: Never Used  . Alcohol Use: 0.6 oz/week    1 Cans of beer per week   Allergies  Allergen Reactions  . Penicillins Swelling    REACTION: "turns  red"   Current Outpatient Prescriptions  Medication Sig Dispense Refill  . amiodarone (PACERONE) 200 MG tablet Take 200 mg by mouth daily.      Marland Kitchen amLODipine (NORVASC) 5 MG tablet TAKE 1 TABLET EVERY DAY  30 tablet  2  . aspirin EC 81 MG tablet Take 81 mg by mouth daily.      . Calcium Carbonate-Vitamin D (CALCIUM + D PO) Take 1 tablet by mouth daily.       . carvedilol (COREG) 6.25 MG tablet TAKE 1 TABLET (6.25 MG TOTAL) BY MOUTH 2 (TWO) TIMES DAILY.  60 tablet  5  . Cyanocobalamin (VITAMIN B-12 IJ) Inject 1,000 mcg as directed daily.      Marland Kitchen docusate sodium (COLACE) 100 MG capsule Take 100 mg by mouth 2 (two) times daily.       . ferrous sulfate 325 (65  FE) MG tablet Take 325 mg by mouth daily with breakfast.      . furosemide (LASIX) 40 MG tablet Take 40 mg by mouth every morning.      Marland Kitchen levothyroxine (SYNTHROID, LEVOTHROID) 25 MCG tablet TAKE 1 TABLET (25 MCG TOTAL) BY MOUTH DAILY.  30 tablet  3  . losartan (COZAAR) 50 MG tablet TAKE 1 TABLET (50 MG TOTAL) BY MOUTH DAILY.  30 tablet  3  . loteprednol (LOTEMAX) 0.5 % ophthalmic suspension Place 1 drop into the right eye every morning.       Marland Kitchen oxybutynin (DITROPAN-XL) 5 MG 24 hr tablet Take 1 tablet (5 mg total) by mouth daily.  30 tablet  3  . pantoprazole (PROTONIX) 40 MG tablet Take 1 tablet (40 mg total) by mouth daily.  90 tablet  2  . pravastatin (PRAVACHOL) 40 MG tablet Take 40 mg by mouth every evening.      Marland Kitchen spironolactone (ALDACTONE) 25 MG tablet Take 0.5 tablets (12.5 mg total) by mouth every other day.  15 tablet  2  . terazosin (HYTRIN) 1 MG capsule Take 1 capsule (1 mg total) by mouth at bedtime.  30 capsule  2  . zoster vaccine live, PF, (ZOSTAVAX) 40981 UNT/0.65ML injection Inject 19,400 Units into the skin once.  1 each  0   No current facility-administered medications for this visit.   Facility-Administered Medications Ordered in Other Visits  Medication Dose Route Frequency Provider Last Rate Last Dose  . cyanocobalamin ((VITAMIN B-12)) injection 1,000 mcg  1,000 mcg Intramuscular Daily Linward Headland, MD   1,000 mcg at 08/12/13 1423  . [START ON 08/17/2013] cyanocobalamin ((VITAMIN B-12)) injection 1,000 mcg  1,000 mcg Intramuscular Weekly Linward Headland, MD       REVIEW OF SYSTEMS: Arly.Keller ] denotes positive finding; [  ] denotes negative finding  CARDIOVASCULAR:  [ ]  chest pain   [ ]  chest pressure   [ ]  palpitations   [ ]  orthopnea   Arly.Keller ] dyspnea on exertion   [ ]  claudication   [ ]  rest pain   [ ]  DVT   [ ]  phlebitis PULMONARY:   [ ]  productive cough   [ ]  asthma   [ ]  wheezing NEUROLOGIC:   [ ]  weakness  [ ]  paresthesias  [ ]  aphasia  [ ]  amaurosis  [ ]   dizziness HEMATOLOGIC:   [ ]  bleeding problems   [ ]  clotting disorders MUSCULOSKELETAL:  [ ]  joint pain   [ ]  joint swelling [ ]  leg swelling GASTROINTESTINAL: [ ]   blood in stool  [ ]   hematemesis GENITOURINARY:  [ ]   dysuria  [ ]   hematuria PSYCHIATRIC:  [ ]  history of major depression INTEGUMENTARY:  [ ]  rashes  [ ]  ulcers CONSTITUTIONAL:  [ ]  fever   [ ]  chills  PHYSICAL EXAM: Filed Vitals:   08/12/13 1447  BP: 142/67  Pulse: 57  Height: 4\' 9"  (1.448 m)  Weight: 212 lb (96.163 kg)  SpO2: 97%   Body mass index is 45.86 kg/(m^2). GENERAL: The patient is a well-nourished male, in no acute distress. The vital signs are documented above. CARDIOVASCULAR: There is a regular rate and rhythm. He has a palpable left femoral and left dorsalis pedis pulse. He has a palpable right femoral pulse. PULMONARY: There is good air exchange bilaterally without wheezing or rales. ABDOMEN: Soft and non-tender with normal pitched bowel sounds.  MUSCULOSKELETAL: There are no major deformities or cyanosis. NEUROLOGIC: No focal weakness or paresthesias are detected. SKIN: There are no ulcers or rashes noted. PSYCHIATRIC: The patient has a normal affect.  MEDICAL ISSUES: The patient is doing well status post angioplasty of a left proximal anterior tibial artery stenosis. The wound on his left great toe healed. He has a palpable left dorsalis pedis pulse. I'll see him back in one year with follow up ABIs. He knows to call sooner if he has problems.  DICKSON,CHRISTOPHER S [facility] Beeper: [phone]

## 2013-08-12 NOTE — Addendum Note (Signed)
Addended by: Sharee Pimple on: 08/12/2013 03:15 PM   Modules accepted: Orders

## 2013-08-13 ENCOUNTER — Ambulatory Visit (INDEPENDENT_AMBULATORY_CARE_PROVIDER_SITE_OTHER): Payer: Medicare Other | Admitting: *Deleted

## 2013-08-13 DIAGNOSIS — E538 Deficiency of other specified B group vitamins: Secondary | ICD-10-CM

## 2013-08-13 MED ORDER — CYANOCOBALAMIN 1000 MCG/ML IJ SOLN
1000.0000 ug | Freq: Once | INTRAMUSCULAR | Status: AC
  Administered 2013-08-13: 1000 ug via INTRAMUSCULAR

## 2013-08-14 ENCOUNTER — Ambulatory Visit: Payer: Medicare Other

## 2013-08-14 ENCOUNTER — Ambulatory Visit (INDEPENDENT_AMBULATORY_CARE_PROVIDER_SITE_OTHER): Payer: Medicare Other | Admitting: *Deleted

## 2013-08-14 DIAGNOSIS — E538 Deficiency of other specified B group vitamins: Secondary | ICD-10-CM

## 2013-08-14 MED ORDER — CYANOCOBALAMIN 1000 MCG/ML IJ SOLN
1000.0000 ug | Freq: Once | INTRAMUSCULAR | Status: AC
  Administered 2013-08-14: 1000 ug via INTRAMUSCULAR

## 2013-08-17 ENCOUNTER — Other Ambulatory Visit: Payer: Self-pay | Admitting: Cardiovascular Disease

## 2013-08-18 ENCOUNTER — Telehealth: Payer: Self-pay | Admitting: Cardiovascular Disease

## 2013-08-18 NOTE — Telephone Encounter (Signed)
New message     CPAP machine is not working.   He has had it since 1996.  He needs a presc for [facility] to get a new machine.

## 2013-08-19 NOTE — Telephone Encounter (Signed)
Per Dr Excell Seltzer he does not manage CPAP. Dr Excell Seltzer would like the pt to contact his PCP. I spoke with the pt and he will contact Dr Kem Kays for assistance in obtaining a new CPAP machine.

## 2013-08-21 ENCOUNTER — Ambulatory Visit (INDEPENDENT_AMBULATORY_CARE_PROVIDER_SITE_OTHER): Payer: Medicare Other | Admitting: Internal Medicine

## 2013-08-21 ENCOUNTER — Ambulatory Visit: Payer: Medicare Other

## 2013-08-21 DIAGNOSIS — N390 Urinary tract infection, site not specified: Secondary | ICD-10-CM

## 2013-08-21 DIAGNOSIS — I10 Essential (primary) hypertension: Secondary | ICD-10-CM

## 2013-08-21 DIAGNOSIS — E538 Deficiency of other specified B group vitamins: Secondary | ICD-10-CM

## 2013-08-21 DIAGNOSIS — N3941 Urge incontinence: Secondary | ICD-10-CM

## 2013-08-21 LAB — BASIC METABOLIC PANEL
BUN: 30 mg/dL — ABNORMAL HIGH (ref 6–23)
Calcium: 9.1 mg/dL (ref 8.4–10.5)
Creat: 1.49 mg/dL — ABNORMAL HIGH (ref 0.50–1.35)

## 2013-08-21 MED ORDER — CYANOCOBALAMIN 1000 MCG/ML IJ SOLN
1000.0000 ug | Freq: Once | INTRAMUSCULAR | Status: AC
  Administered 2013-08-21: 1000 ug via INTRAMUSCULAR

## 2013-08-21 NOTE — Assessment & Plan Note (Addendum)
Stable blood pressure.   Vitals - 1 value per visit 08/12/2013 07/30/2013 06/25/2013  SYSTOLIC [phone]  DIASTOLIC 67 75 73   Plan- Continue current medication regimen. - BMP check today, as last BMP- 5.6, spironolactone dose was decreased by half after last visit.

## 2013-08-21 NOTE — Assessment & Plan Note (Addendum)
Pt still complains of urge incontinence, but also complains of some new frequency in the past  2-3 days mostly present in the mornings only, which was not present before.   Plan- Urinalysis - Increase terazosin to 2mg  daily.   08/26/2013- Results of urinalysis- Pt was not able to void on the day he initaily presented, so urine some was brought about 3 days later, after the weekend. Urinalysis Was positive for Nitrites and PH- 8.5. Will start treatment with 250mg  of ciprofloxacin for 5 days. Called pt and explained the treatment plan, he verbalized understanding and agreed.

## 2013-08-21 NOTE — Progress Notes (Signed)
Patient ID: Eugene [name], male   DOB: [birth year], 79 y.o.   MRN: [id number]   Subjective:   Patient ID: Eugene [name] male   DOB: [birth year] 79 y.o.   MRN: [id number]  HPI: [name] is a 79 y.o. PMH of SVT, HTN, chronic venous insufficiency, CAD, PVD, Aortic stenosis, Hyperlipidemia, acute renal insufficiency, bradycardia.  Presented today with complaints of urinary frequency, and passing urine before he can get to the bathroom, present for the past 2-3 days, different from the usual urinary problem he has been having in the past- which is incontinence. No fever, abdominal pain or flank pain, no obstructive symptoms like hesitancy or straining. Pt also complains that since his CPAP machine broke a few days ago he has not been able to sleep well, and has been using the CPAP machine for over 20years.   Past Medical History  Diagnosis Date  . Glaucoma     Right eye  . Diverticulosis     multiple hospital admissions for GI bleed, has an episode roughly every 6 months,, last  colonscopy in 2006 showed diverticulosis, was evaluated by Dr. Madilyn Fireman in the hospital in 2011 for lower GI bleed andd it  was suggested that the patient was not actively bleeding at that time and given his comorbid conditions colonoscopy was deferred. He is also chronically constipated  . Macular degeneration of right eye   . Allergic rhinitis   . CAD (coronary artery disease)     s/p PCI in 1991;  Cardiac catheterization 11/02/11: Ostial LAD 95% with aneurysmal dilatation after this, proximal circumflex 70% with a superior branch 30-40%, proximal RCA 75% and 90%, mid RCA 50% and 90% before a PDA, then occluded.;    high risk for CABG;PCI 11/14/11: Bare-metal stent to the proximal LAD    . OSA on CPAP   . Hypertension   . Hyperlipidemia   . Chronic systolic heart failure     Echocardiogram 11/02/11: Moderate LVH, EF 30-35%, multiple wall motion abnormalities, critical aortic stenosis, AVA 0.6, mean gradient 45, mild MR,  severe LAE  . Supraventricular tachycardia     2 syndromes-nonsustained atrial tachycardia//adenosine responsive diuretic positive reentry probably AV node reentry  . Aortic stenosis     s/p AV balloon valvuloplasty by Dr. Excell Seltzer 11/2011  . Ischemic cardiomyopathy   . H/O: GI bleed     recurrent  . Heart murmur   . CHF (congestive heart failure)   . Myocardial infarction 10/28/2011  . Pneumonia 2012  . Exertional shortness of breath   . Hypothyroidism   . GERD (gastroesophageal reflux disease)   . Arthritis     "might have slight; have trouble w/my fingers" (03/30/2013)   Current Outpatient Prescriptions  Medication Sig Dispense Refill  . amiodarone (PACERONE) 200 MG tablet Take 200 mg by mouth daily.      Marland Kitchen amLODipine (NORVASC) 5 MG tablet TAKE 1 TABLET EVERY DAY  30 tablet  2  . aspirin EC 81 MG tablet Take 81 mg by mouth daily.      . Calcium Carbonate-Vitamin D (CALCIUM + D PO) Take 1 tablet by mouth daily.       . carvedilol (COREG) 6.25 MG tablet TAKE 1 TABLET (6.25 MG TOTAL) BY MOUTH 2 (TWO) TIMES DAILY.  60 tablet  5  . Cyanocobalamin (VITAMIN B-12 IJ) Inject 1,000 mcg as directed daily.      Marland Kitchen docusate sodium (COLACE) 100 MG capsule Take 100 mg by mouth 2 (two) times  daily.       . ferrous sulfate 325 (65 FE) MG tablet Take 325 mg by mouth daily with breakfast.      . furosemide (LASIX) 40 MG tablet Take 40 mg by mouth every morning.      Marland Kitchen levothyroxine (SYNTHROID, LEVOTHROID) 25 MCG tablet TAKE 1 TABLET (25 MCG TOTAL) BY MOUTH DAILY.  30 tablet  3  . losartan (COZAAR) 50 MG tablet TAKE 1 TABLET (50 MG TOTAL) BY MOUTH DAILY.  30 tablet  3  . loteprednol (LOTEMAX) 0.5 % ophthalmic suspension Place 1 drop into the right eye every morning.       Marland Kitchen oxybutynin (DITROPAN-XL) 5 MG 24 hr tablet Take 1 tablet (5 mg total) by mouth daily.  30 tablet  3  . pantoprazole (PROTONIX) 40 MG tablet Take 1 tablet (40 mg total) by mouth daily.  90 tablet  2  . pravastatin (PRAVACHOL) 40 MG  tablet Take 40 mg by mouth every evening.      . pravastatin (PRAVACHOL) 40 MG tablet TAKE 1 TABLET (40 MG TOTAL) BY MOUTH EVERY EVENING.  30 tablet  2  . spironolactone (ALDACTONE) 25 MG tablet Take 0.5 tablets (12.5 mg total) by mouth every other day.  15 tablet  2  . terazosin (HYTRIN) 1 MG capsule Take 1 capsule (1 mg total) by mouth at bedtime.  30 capsule  2  . zoster vaccine live, PF, (ZOSTAVAX) 16109 UNT/0.65ML injection Inject 19,400 Units into the skin once.  1 each  0   No current facility-administered medications for this visit.   Facility-Administered Medications Ordered in Other Visits  Medication Dose Route Frequency Provider Last Rate Last Dose  . cyanocobalamin ((VITAMIN B-12)) injection 1,000 mcg  1,000 mcg Intramuscular Weekly Linward Headland, MD       Family History  Problem Relation Age of Onset  . Cancer Mother   . Cancer Father   . Heart disease Sister     Heart disease before age 59   History   Social History  . Marital Status: Widowed    Spouse Name: N/A    Number of Children: N/A  . Years of Education: N/A   Social History Main Topics  . Smoking status: Former Smoker -- 3.00 packs/day for 21 years    Types: Cigarettes    Quit date: 10/08/1958  . Smokeless tobacco: Never Used  . Alcohol Use: 0.6 oz/week    1 Cans of beer per week  . Drug Use: No  . Sexual Activity: No   Other Topics Concern  . Not on file   Social History Narrative   Lives at the [facility]. He has a daughter in [place] and a health-care power of attorney. He also has a close friend who takes him for his doctor's appointments and is with him during the hospital admission. 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 Never smoked or drank alcohol in past 40 years. No illicit drug use. Has Medicare.   Review of Systems: CONSTITUTIONAL- No Fever, or weigtloss. SKIN- No Rash, colour changes, itching. HEAD- No  Headache. Mouth/throat- No Sorethroat. RESPIRATORY- No Cough, or SOB. CARDIAC- No Palpitations, or chest pain. GI- No, vomiting, diarrhoea, constipation, abd pain, jaundice, last colonoscopy. NEUROLOGIC- No Numbness, syncope,or burning.  Objective:  Physical Exam: There were no vitals filed for this visit.  GENERAL- alert, co-operative, appears as stated age, not in any distress. HEENT- Atraumatic, normocephalic, Rt  eye- fixed, not reactive to light- Has had 4 surgeries on Rt eye, EOMI, oral mucosa appears moist, no cervical LN enlargement, thyroid does not appear enlarged. CARDIAC- RRR, systolic murmur loudest in the aortic area-3/6 , rubs or gallops. RESP- Moving equal volumes of air, and clear to auscultation bilaterally. ABDOMEN- Soft, nontender, no palpable masses or organomegaly, bowel sounds present. NEURO- No cranial N abnormality, strenght equal and present in all extremities, EXTREMITIES- pulse 2+, symmetric. SKIN- Warm, dry, No rash or lesion. PSYCH- Normal mood and affect, appropriate thought content and speech.  Assessment & Plan:  The patient's case and plan of care was discussed with attending physician, Dr. Sharlene Motts.  Please see problem based chatting for asessment and plan.

## 2013-08-21 NOTE — Patient Instructions (Signed)
We will be prescribing your CPAP machine for you. But you might not get it today.  Also we will be increasing the dose of your medication called Terazosin to 2mg  daily to help with your bladder problems.  We will also be doing some blood work, if there are any abnormalities requiring adjustments of your medications we will let you know.

## 2013-08-24 ENCOUNTER — Encounter: Payer: Self-pay | Admitting: Cardiology

## 2013-08-24 ENCOUNTER — Encounter: Payer: Self-pay | Admitting: Cardiovascular Disease

## 2013-08-24 ENCOUNTER — Other Ambulatory Visit: Payer: Medicare Other

## 2013-08-24 ENCOUNTER — Encounter: Payer: Self-pay | Admitting: Gastroenterology

## 2013-08-24 LAB — URINALYSIS
Bilirubin Urine: NEGATIVE
Nitrite: POSITIVE — AB
Protein, ur: NEGATIVE mg/dL
Urobilinogen, UA: 1 mg/dL (ref 0.0–1.0)

## 2013-08-25 NOTE — Progress Notes (Signed)
I saw and evaluated the patient.  I personally confirmed the key portions of the history and exam documented by Dr. Emokpae and I reviewed pertinent patient test results.  The assessment, diagnosis, and plan were formulated together and I agree with the documentation in the resident's note. 

## 2013-08-26 ENCOUNTER — Other Ambulatory Visit: Payer: Self-pay | Admitting: Internal Medicine

## 2013-08-26 MED ORDER — CIPROFLOXACIN HCL 250 MG PO TABS: 250.0000 mg | ORAL_TABLET | Freq: Two times a day (BID) | ORAL | Status: DC

## 2013-08-28 ENCOUNTER — Ambulatory Visit (INDEPENDENT_AMBULATORY_CARE_PROVIDER_SITE_OTHER): Payer: Medicare Other | Admitting: *Deleted

## 2013-08-28 DIAGNOSIS — E538 Deficiency of other specified B group vitamins: Secondary | ICD-10-CM

## 2013-08-28 MED ORDER — CYANOCOBALAMIN 1000 MCG/ML IJ SOLN
1000.0000 ug | Freq: Once | INTRAMUSCULAR | Status: AC
  Administered 2013-08-28: 1000 ug via INTRAMUSCULAR

## 2013-08-29 ENCOUNTER — Other Ambulatory Visit: Payer: Self-pay | Admitting: Internal Medicine

## 2013-09-05 ENCOUNTER — Other Ambulatory Visit: Payer: Self-pay | Admitting: Cardiology

## 2013-09-08 ENCOUNTER — Other Ambulatory Visit: Payer: Self-pay | Admitting: Licensed Clinical Social Worker

## 2013-09-08 ENCOUNTER — Encounter: Payer: Self-pay | Admitting: Internal Medicine

## 2013-09-08 ENCOUNTER — Ambulatory Visit (INDEPENDENT_AMBULATORY_CARE_PROVIDER_SITE_OTHER): Payer: Medicare Other | Admitting: Internal Medicine

## 2013-09-08 VITALS — BP 124/65 | HR 42 | Temp 98.3°F | Wt 210.3 lb

## 2013-09-08 DIAGNOSIS — R296 Repeated falls: Secondary | ICD-10-CM

## 2013-09-08 DIAGNOSIS — R05 Cough: Secondary | ICD-10-CM | POA: Insufficient documentation

## 2013-09-08 DIAGNOSIS — G4733 Obstructive sleep apnea (adult) (pediatric): Secondary | ICD-10-CM

## 2013-09-08 DIAGNOSIS — Z9181 History of falling: Secondary | ICD-10-CM

## 2013-09-08 DIAGNOSIS — E538 Deficiency of other specified B group vitamins: Secondary | ICD-10-CM

## 2013-09-08 MED ORDER — BENZONATATE 100 MG PO CAPS: 100.0000 mg | ORAL_CAPSULE | Freq: Three times a day (TID) | ORAL | Status: DC | PRN

## 2013-09-08 NOTE — Progress Notes (Signed)
Patient ID: Eugene [name], male   DOB: Jun 09, 1924, 79 y.o.   MRN: [id number]  Subjective:   Patient ID: Eugene [name] male   DOB: Oct 05, 1924 79 y.o.   MRN: [id number]  HPI: Eugene [name] is a 79 y.o. M w/ PMH SVT, HTN, chronic venous insufficiency, CAD, PVD, Aortic stenosis, Hyperlipidemia, acute renal insufficiency, bradycardia who presents today with paperwork for a new CPAP machine.  He was seen on 11/14 and was c/o that his 20+yo CPAP machine had broken. He was required to be seen back in the clinic today for a new CPAP machine. He has been sleeping much less because he doesn't have his CPAP.   He has a caregiver with him today who states that the pt is falling more frequently, and has fallen 2 times over the past 3 days, most recently today. He fell on Saturday walking up the stairs and hit his head. He endorses dizziness after hitting his head, but denies any LOC. he does endorse occasional light-headedness since he hit his head. He denies any pain. His caregiver states that control of his BLE is worsening. He is having more difficulty getting in and out of the car. He states that the pt is trying to move faster than his body is able to move.    He does have a home attendant who takes care of the cleanining and laundry. He has someone with him most days, but he was alone when he fell both times. Per his attendant, his speech is slurring more. This has been occurring over the past several months. His caregiver states that these symptoms have been more pronounced since his CPAP machine broke and the pt has not been sleeping well at night.   He also presents today with a productive cough for the past 2 days.n He denies any fevers or chills. He states that in the past when he developed a cough he would take 2 aspirin and take a shot of whiskey, which would resolve the cough. However, he states that he is not able to do that now a days.    Past Medical History  Diagnosis Date  . Glaucoma    Right eye  . Diverticulosis     multiple hospital admissions for GI bleed, has an episode roughly every 6 months,, last  colonscopy in 2006 showed diverticulosis, was evaluated by Dr. Madilyn Fireman in the hospital in 2011 for lower GI bleed andd it  was suggested that the patient was not actively bleeding at that time and given his comorbid conditions colonoscopy was deferred. He is also chronically constipated  . Macular degeneration of right eye   . Allergic rhinitis   . CAD (coronary artery disease)     s/p PCI in 1991;  Cardiac catheterization 11/02/11: Ostial LAD 95% with aneurysmal dilatation after this, proximal circumflex 70% with a superior branch 30-40%, proximal RCA 75% and 90%, mid RCA 50% and 90% before a PDA, then occluded.;    high risk for CABG;PCI 11/14/11: Bare-metal stent to the proximal LAD    . OSA on CPAP   . Hypertension   . Hyperlipidemia   . Chronic systolic heart failure     Echocardiogram 11/02/11: Moderate LVH, EF 30-35%, multiple wall motion abnormalities, critical aortic stenosis, AVA 0.6, mean gradient 45, mild MR, severe LAE  . Supraventricular tachycardia     2 syndromes-nonsustained atrial tachycardia//adenosine responsive diuretic positive reentry probably AV node reentry  . Aortic stenosis     s/p AV  balloon valvuloplasty by Dr. Excell Seltzer 11/2011  . Ischemic cardiomyopathy   . H/O: GI bleed     recurrent  . Heart murmur   . CHF (congestive heart failure)   . Myocardial infarction 10/28/2011  . Pneumonia 2012  . Exertional shortness of breath   . Hypothyroidism   . GERD (gastroesophageal reflux disease)   . Arthritis     "might have slight; have trouble w/my fingers" (03/30/2013)   Current Outpatient Prescriptions  Medication Sig Dispense Refill  . amiodarone (PACERONE) 200 MG tablet Take 200 mg by mouth daily.      Marland Kitchen amiodarone (PACERONE) 200 MG tablet TAKE 1 TABLET EVERY DAY  30 tablet  1  . amLODipine (NORVASC) 5 MG tablet TAKE 1 TABLET EVERY DAY  30 tablet  2  .  amLODipine (NORVASC) 5 MG tablet TAKE 1 TABLET EVERY DAY  30 tablet  2  . aspirin EC 81 MG tablet Take 81 mg by mouth daily.      . Calcium Carbonate-Vitamin D (CALCIUM + D PO) Take 1 tablet by mouth daily.       . carvedilol (COREG) 6.25 MG tablet TAKE 1 TABLET (6.25 MG TOTAL) BY MOUTH 2 (TWO) TIMES DAILY.  60 tablet  5  . ciprofloxacin (CIPRO) 250 MG tablet Take 1 tablet (250 mg total) by mouth 2 (two) times daily.  10 tablet  0  . Cyanocobalamin (VITAMIN B-12 IJ) Inject 1,000 mcg as directed daily.      Marland Kitchen docusate sodium (COLACE) 100 MG capsule Take 100 mg by mouth 2 (two) times daily.       . ferrous sulfate 325 (65 FE) MG tablet Take 325 mg by mouth daily with breakfast.      . furosemide (LASIX) 40 MG tablet Take 40 mg by mouth every morning.      Marland Kitchen levothyroxine (SYNTHROID, LEVOTHROID) 25 MCG tablet TAKE 1 TABLET (25 MCG TOTAL) BY MOUTH DAILY.  30 tablet  3  . losartan (COZAAR) 50 MG tablet TAKE 1 TABLET (50 MG TOTAL) BY MOUTH DAILY.  30 tablet  3  . loteprednol (LOTEMAX) 0.5 % ophthalmic suspension Place 1 drop into the right eye every morning.       Marland Kitchen oxybutynin (DITROPAN-XL) 5 MG 24 hr tablet Take 1 tablet (5 mg total) by mouth daily.  30 tablet  3  . pantoprazole (PROTONIX) 40 MG tablet Take 1 tablet (40 mg total) by mouth daily.  90 tablet  2  . pravastatin (PRAVACHOL) 40 MG tablet Take 40 mg by mouth every evening.      . pravastatin (PRAVACHOL) 40 MG tablet TAKE 1 TABLET (40 MG TOTAL) BY MOUTH EVERY EVENING.  30 tablet  2  . spironolactone (ALDACTONE) 25 MG tablet Take 0.5 tablets (12.5 mg total) by mouth every other day.  15 tablet  2  . terazosin (HYTRIN) 1 MG capsule Take 1 capsule (1 mg total) by mouth at bedtime.  30 capsule  2  . zoster vaccine live, PF, (ZOSTAVAX) 91478 UNT/0.65ML injection Inject 19,400 Units into the skin once.  1 each  0   No current facility-administered medications for this visit.   Family History  Problem Relation Age of Onset  . Cancer Mother   .  Cancer Father   . Heart disease Sister     Heart disease before age 77   History   Social History  . Marital Status: Widowed    Spouse Name: N/A    Number of Children:  N/A  . Years of Education: N/A   Social History Main Topics  . Smoking status: Former Smoker -- 3.00 packs/day for 21 years    Types: Cigarettes    Quit date: 10/08/1958  . Smokeless tobacco: Never Used  . Alcohol Use: 0.6 oz/week    1 Cans of beer per week  . Drug Use: No  . Sexual Activity: No   Other Topics Concern  . Not on file   Social History Narrative   Lives at the [facility]. He has a daughter in [place] and a health-care power of attorney. He also has a close friend who takes him for his doctor's appointments and is with him during the hospital admission. 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 Never smoked or drank alcohol in past 40 years. No illicit drug use. Has Medicare.   Review of Systems: Constitutional: Denies fever, chills, diaphoresis, appetite change and fatigue.  HEENT: Denies eye pain, redness, hearing loss, ear pain, neck pain,.  Respiratory: +DOE and productive cough   Cardiovascular: Denies leg swelling.  Gastrointestinal: Denies nausea, vomiting, abdominal pain, diarrhea, constipation Musculoskeletal: Denies myalgias or arthralgias. +gait problem  Skin: Bruising to the left shoulder Neurological: +falls, light-headedness and dizziness that has resolved. Denies headaches, syncope, or LOC. Denies vision changes Psychiatric/Behavioral: Denies mood changes  Objective:  Physical Exam: Filed Vitals:   09/08/13 1553  BP: 124/65  Pulse: 42   Constitutional: Vital signs reviewed.  Patient is a well-developed and well-nourished male in no acute distress and cooperative with exam.  Head: Normocephalic and atraumatic, no edema or ecchymoses to the head/face Eyes: EOMI, conjunctivae normal, No scleral icterus.  Cardiovascular:  Bradycardic but regular rhythm, 3/6 systolic murmur, pulses symmetric and intact bilaterally Pulmonary/Chest: Normal respiratory effort. End-expiratory wheezes predominantly heard at the right base. +Productive cough. Abdominal: Soft. Non-tender, non-distended Musculoskeletal: moves all 4 extremities. Trace pitting edema to BLE at the shins.  Neurological: A&O x3, Strength is normal and symmetric in BUE, pt sitting in wheel chair, but BLE with slower movements, cranial nerve II-XII are grossly intact. Skin: Warm, dry and intact. +large ecchymosis to the left shoulder Psychiatric: Normal mood and affect. Speech and behavior appear normal. Cognition and memory appear normal.   Assessment & Plan:   Please refer to Problem List based Assessment and Plan

## 2013-09-08 NOTE — Assessment & Plan Note (Signed)
Patient's bone twice in the past 3 days, and hit his head on Saturday after fall. He says he did land on his left shoulder first which does have a large ecchymoses on it, and then "bumped" his head. He denies any pain, but does endorse some dizziness after the fall. He denies any headaches currently, but states he has been experiencing some intermittent lightheadedness. He denies any changes in vision or acute changes in speech. He must call both times, but apparently did not feel that the patient required a trip to emergency department. Patient  and his caregiver were notified to called EMS for the patient to be emergently taken to the ED for any increased dizziness/lightheadedness, headache, changes in vision, changes in speech, or changes in level of consciousness. I placed a referral for social work for a home safety evaluation, nursing aide, and physical therapy do to his increased falls.

## 2013-09-08 NOTE — Patient Instructions (Addendum)
**If you begin to have increasing confusion, changes in speech or vision, headaches, and/or increased dizziness or lightheadedness, please immediately go to the emergency room for further evaluation.   **I have called in a prescription for a cough suppressant for you to your pharmacy. You can take this 3 times a day as needed for your cough.  **We will fax in the form for your CPAP.     Fall Prevention and Home Safety Falls cause injuries and can affect all age groups. It is possible to use preventive measures to significantly decrease the likelihood of falls. There are many simple measures which can make your home safer and prevent falls. OUTDOORS  Repair cracks and edges of walkways and driveways.  Remove high doorway thresholds.  Trim shrubbery on the main path into your home.  Have good outside lighting.  Clear walkways of tools, rocks, debris, and clutter.  Check that handrails are not broken and are securely fastened. Both sides of steps should have handrails.  Have leaves, snow, and ice cleared regularly.  Use sand or salt on walkways during winter months.  In the garage, clean up grease or oil spills. BATHROOM  Install night lights.  Install grab bars by the toilet and in the tub and shower.  Use non-skid mats or decals in the tub or shower.  Place a plastic non-slip stool in the shower to sit on, if needed.  Keep floors dry and clean up all water on the floor immediately.  Remove soap buildup in the tub or shower on a regular basis.  Secure bath mats with non-slip, double-sided rug tape.  Remove throw rugs and tripping hazards from the floors. BEDROOMS  Install night lights.  Make sure a bedside light is easy to reach.  Do not use oversized bedding.  Keep a telephone by your bedside.  Have a firm chair with side arms to use for getting dressed.  Remove throw rugs and tripping hazards from the floor. KITCHEN  Keep handles on pots and pans turned  toward the center of the stove. Use back burners when possible.  Clean up spills quickly and allow time for drying.  Avoid walking on wet floors.  Avoid hot utensils and knives.  Position shelves so they are not too high or low.  Place commonly used objects within easy reach.  If necessary, use a sturdy step stool with a grab bar when reaching.  Keep electrical cables out of the way.  Do not use floor polish or wax that makes floors slippery. If you must use wax, use non-skid floor wax.  Remove throw rugs and tripping hazards from the floor. STAIRWAYS  Never leave objects on stairs.  Place handrails on both sides of stairways and use them. Fix any loose handrails. Make sure handrails on both sides of the stairways are as long as the stairs.  Check carpeting to make sure it is firmly attached along stairs. Make repairs to worn or loose carpet promptly.  Avoid placing throw rugs at the top or bottom of stairways, or properly secure the rug with carpet tape to prevent slippage. Get rid of throw rugs, if possible.  Have an electrician put in a light switch at the top and bottom of the stairs. OTHER FALL PREVENTION TIPS  Wear low-heel or rubber-soled shoes that are supportive and fit well. Wear closed toe shoes.  When using a stepladder, make sure it is fully opened and both spreaders are firmly locked. Do not climb a closed stepladder.  Add color or contrast paint or tape to grab bars and handrails in your home. Place contrasting color strips on first and last steps.  Learn and use mobility aids as needed. Install an electrical emergency response system.  Turn on lights to avoid dark areas. Replace light bulbs that burn out immediately. Get light switches that glow.  Arrange furniture to create clear pathways. Keep furniture in the same place.  Firmly attach carpet with non-skid or double-sided tape.  Eliminate uneven floor surfaces.  Select a carpet pattern that does not  visually hide the edge of steps.  Be aware of all pets. OTHER HOME SAFETY TIPS  Set the water temperature for 120 F (48.8 C).  Keep emergency numbers on or near the telephone.  Keep smoke detectors on every level of the home and near sleeping areas. Document Released: 09/14/2002 Document Revised: 03/25/2012 Document Reviewed: 12/14/2011 [facility] Patient Information 2014 [place].

## 2013-09-08 NOTE — Assessment & Plan Note (Signed)
Patient with a productive cough for the past 2 days. He is requesting a some sort of cough suppressant. He does have end expiratory wheezes the right base, and some diffuse coarseness but overall sounds fairly clear. He denies any fevers or chills. Prescribing Tessalon Perles 1 tablet 3 times a day as needed for cough.

## 2013-09-08 NOTE — Assessment & Plan Note (Signed)
Patient states that his CPAP machine of over 20 years broke about a month ago. He's been without machine, and has been unable to get one until he was seen in the clinic had a face-to-face encounter. Unfortunately without his CPAP he has not been sleeping well, and his caregiver feels this has contributed to worsening of his symptoms, including falls and decompensation of his ability to ambulate, and increased slurred speech. He face-to-face encounter for new CPAP, and the forms for the new CPAP machine were sent to the home health equipment folks. Hopefully he'll be up against the CPAP very soon, and that way he'll be able to get a good nights rest.

## 2013-09-09 MED ORDER — CYANOCOBALAMIN 1000 MCG/ML IJ SOLN
1000.0000 ug | Freq: Once | INTRAMUSCULAR | Status: AC
  Administered 2013-09-08: 1000 ug via INTRAMUSCULAR

## 2013-09-09 NOTE — Addendum Note (Signed)
Addended by: Neomia Dear on: 09/09/2013 10:41 AM   Modules accepted: Orders

## 2013-09-09 NOTE — Addendum Note (Signed)
Addended by: Angelina Ok F on: 09/09/2013 08:25 AM   Modules accepted: Orders

## 2013-09-10 NOTE — Progress Notes (Signed)
Case discussed with Dr. Glenn soon after the resident saw the patient.  We reviewed the resident's history and exam and pertinent patient test results.  I agree with the assessment, diagnosis, and plan of care documented in the resident's note. 

## 2013-09-11 ENCOUNTER — Encounter (HOSPITAL_COMMUNITY): Payer: Self-pay | Admitting: Emergency Medicine

## 2013-09-11 ENCOUNTER — Inpatient Hospital Stay (HOSPITAL_COMMUNITY)
Admission: EM | Admit: 2013-09-11 | Discharge: 2013-09-15 | DRG: 309 | Disposition: A | Discharge status: Skilled Nursing Facility | Payer: Medicare Other | Attending: Internal Medicine | Admitting: Internal Medicine

## 2013-09-11 ENCOUNTER — Other Ambulatory Visit: Payer: Self-pay

## 2013-09-11 ENCOUNTER — Emergency Department (HOSPITAL_COMMUNITY): Payer: Medicare Other

## 2013-09-11 ENCOUNTER — Inpatient Hospital Stay (HOSPITAL_COMMUNITY): Payer: Medicare Other

## 2013-09-11 ENCOUNTER — Other Ambulatory Visit: Payer: Self-pay | Admitting: Internal Medicine

## 2013-09-11 DIAGNOSIS — R001 Bradycardia, unspecified: Secondary | ICD-10-CM

## 2013-09-11 DIAGNOSIS — L97909 Non-pressure chronic ulcer of unspecified part of unspecified lower leg with unspecified severity: Secondary | ICD-10-CM | POA: Diagnosis present

## 2013-09-11 DIAGNOSIS — I2589 Other forms of chronic ischemic heart disease: Secondary | ICD-10-CM | POA: Diagnosis present

## 2013-09-11 DIAGNOSIS — Z6841 Body Mass Index (BMI) 40.0 and over, adult: Secondary | ICD-10-CM

## 2013-09-11 DIAGNOSIS — L97509 Non-pressure chronic ulcer of other part of unspecified foot with unspecified severity: Secondary | ICD-10-CM | POA: Diagnosis present

## 2013-09-11 DIAGNOSIS — I509 Heart failure, unspecified: Secondary | ICD-10-CM | POA: Diagnosis present

## 2013-09-11 DIAGNOSIS — Z880 Allergy status to penicillin: Secondary | ICD-10-CM

## 2013-09-11 DIAGNOSIS — R269 Unspecified abnormalities of gait and mobility: Secondary | ICD-10-CM | POA: Diagnosis present

## 2013-09-11 DIAGNOSIS — I5022 Chronic systolic (congestive) heart failure: Secondary | ICD-10-CM | POA: Diagnosis present

## 2013-09-11 DIAGNOSIS — I4891 Unspecified atrial fibrillation: Secondary | ICD-10-CM | POA: Diagnosis present

## 2013-09-11 DIAGNOSIS — Z954 Presence of other heart-valve replacement: Secondary | ICD-10-CM | POA: Diagnosis present

## 2013-09-11 DIAGNOSIS — E039 Hypothyroidism, unspecified: Secondary | ICD-10-CM | POA: Diagnosis present

## 2013-09-11 DIAGNOSIS — J189 Pneumonia, unspecified organism: Secondary | ICD-10-CM

## 2013-09-11 DIAGNOSIS — F0390 Unspecified dementia without behavioral disturbance: Secondary | ICD-10-CM | POA: Diagnosis present

## 2013-09-11 DIAGNOSIS — I2510 Atherosclerotic heart disease of native coronary artery without angina pectoris: Secondary | ICD-10-CM | POA: Diagnosis present

## 2013-09-11 DIAGNOSIS — Z951 Presence of aortocoronary bypass graft: Secondary | ICD-10-CM | POA: Diagnosis present

## 2013-09-11 DIAGNOSIS — I10 Essential (primary) hypertension: Secondary | ICD-10-CM | POA: Diagnosis present

## 2013-09-11 DIAGNOSIS — Z7982 Long term (current) use of aspirin: Secondary | ICD-10-CM

## 2013-09-11 DIAGNOSIS — H409 Unspecified glaucoma: Secondary | ICD-10-CM | POA: Diagnosis present

## 2013-09-11 DIAGNOSIS — G4733 Obstructive sleep apnea (adult) (pediatric): Secondary | ICD-10-CM | POA: Diagnosis present

## 2013-09-11 DIAGNOSIS — Y92009 Unspecified place in unspecified non-institutional (private) residence as the place of occurrence of the external cause: Secondary | ICD-10-CM

## 2013-09-11 DIAGNOSIS — E785 Hyperlipidemia, unspecified: Secondary | ICD-10-CM | POA: Diagnosis present

## 2013-09-11 DIAGNOSIS — K219 Gastro-esophageal reflux disease without esophagitis: Secondary | ICD-10-CM | POA: Diagnosis present

## 2013-09-11 DIAGNOSIS — R5381 Other malaise: Secondary | ICD-10-CM | POA: Diagnosis present

## 2013-09-11 DIAGNOSIS — I959 Hypotension, unspecified: Secondary | ICD-10-CM | POA: Diagnosis present

## 2013-09-11 DIAGNOSIS — R0602 Shortness of breath: Secondary | ICD-10-CM | POA: Diagnosis present

## 2013-09-11 DIAGNOSIS — I495 Sick sinus syndrome: Principal | ICD-10-CM | POA: Diagnosis present

## 2013-09-11 DIAGNOSIS — Z9861 Coronary angioplasty status: Secondary | ICD-10-CM

## 2013-09-11 DIAGNOSIS — R296 Repeated falls: Secondary | ICD-10-CM

## 2013-09-11 DIAGNOSIS — Z87891 Personal history of nicotine dependence: Secondary | ICD-10-CM

## 2013-09-11 DIAGNOSIS — E669 Obesity, unspecified: Secondary | ICD-10-CM

## 2013-09-11 DIAGNOSIS — W19XXXA Unspecified fall, initial encounter: Secondary | ICD-10-CM

## 2013-09-11 DIAGNOSIS — R443 Hallucinations, unspecified: Secondary | ICD-10-CM | POA: Diagnosis not present

## 2013-09-11 DIAGNOSIS — I252 Old myocardial infarction: Secondary | ICD-10-CM | POA: Diagnosis present

## 2013-09-11 DIAGNOSIS — E861 Hypovolemia: Secondary | ICD-10-CM | POA: Diagnosis not present

## 2013-09-11 DIAGNOSIS — H3530 Unspecified macular degeneration: Secondary | ICD-10-CM | POA: Diagnosis present

## 2013-09-11 HISTORY — DX: Repeated falls: R29.6

## 2013-09-11 LAB — COMPREHENSIVE METABOLIC PANEL
Albumin: 3.5 g/dL (ref 3.5–5.2)
BUN: 31 mg/dL — ABNORMAL HIGH (ref 6–23)
CO2: 27 mEq/L (ref 19–32)
Calcium: 9.2 mg/dL (ref 8.4–10.5)
Chloride: 102 mEq/L (ref 96–112)
Creatinine, Ser: 1.22 mg/dL (ref 0.50–1.35)
GFR calc Af Amer: 60 mL/min — ABNORMAL LOW (ref >90)
GFR calc non Af Amer: 51 mL/min — ABNORMAL LOW (ref >90)
Potassium: 4.4 mEq/L (ref 3.5–5.1)
Total Bilirubin: 0.5 mg/dL (ref 0.3–1.2)
Total Protein: 6.3 g/dL (ref 6.0–8.3)

## 2013-09-11 LAB — URINALYSIS, ROUTINE W REFLEX MICROSCOPIC
Bilirubin Urine: NEGATIVE
Leukocytes, UA: NEGATIVE
Nitrite: NEGATIVE
Protein, ur: NEGATIVE mg/dL
Specific Gravity, Urine: 1.011 (ref 1.005–1.030)
Urobilinogen, UA: 0.2 mg/dL (ref 0.0–1.0)
pH: 5 (ref 5.0–8.0)

## 2013-09-11 LAB — CBC WITH DIFFERENTIAL/PLATELET
Basophils Relative: 0 % (ref 0–1)
Eosinophils Relative: 3 % (ref 0–5)
HCT: 41.6 % (ref 39.0–52.0)
Hemoglobin: 13.9 g/dL (ref 13.0–17.0)
Lymphocytes Relative: 18 % (ref 12–46)
MCHC: 33.4 g/dL (ref 30.0–36.0)
MCV: 94.3 fL (ref 78.0–100.0)
Monocytes Absolute: 0.7 10*3/uL (ref 0.1–1.0)
Monocytes Relative: 9 % (ref 3–12)
Neutro Abs: 4.8 10*3/uL (ref 1.7–7.7)
Neutrophils Relative %: 69 % (ref 43–77)
Platelets: 172 10*3/uL (ref 150–400)
RDW: 14.3 % (ref 11.5–15.5)
WBC: 6.9 10*3/uL (ref 4.0–10.5)

## 2013-09-11 LAB — PRO B NATRIURETIC PEPTIDE: Pro B Natriuretic peptide (BNP): 1443 pg/mL — ABNORMAL HIGH (ref 0–450)

## 2013-09-11 LAB — POCT I-STAT TROPONIN I

## 2013-09-11 MED ORDER — ACETAMINOPHEN 325 MG PO TABS: 650.0000 mg | ORAL_TABLET | Freq: Four times a day (QID) | ORAL | Status: DC | PRN

## 2013-09-11 MED ORDER — HEPARIN SODIUM (PORCINE) 5000 UNIT/ML IJ SOLN
5000.0000 [IU] | Freq: Three times a day (TID) | INTRAMUSCULAR | Status: DC
  Administered 2013-09-11 – 2013-09-15 (×12): 5000 [IU] via SUBCUTANEOUS
  Filled 2013-09-11 (×14): qty 1

## 2013-09-11 MED ORDER — PANTOPRAZOLE SODIUM 40 MG PO TBEC: 40.0000 mg | DELAYED_RELEASE_TABLET | Freq: Every day | ORAL | Status: DC

## 2013-09-11 MED ORDER — LEVOTHYROXINE SODIUM 25 MCG PO TABS
25.0000 ug | ORAL_TABLET | Freq: Every day | ORAL | Status: DC
  Administered 2013-09-12 – 2013-09-15 (×4): 25 ug via ORAL
  Filled 2013-09-11 (×5): qty 1

## 2013-09-11 MED ORDER — ALBUTEROL SULFATE (5 MG/ML) 0.5% IN NEBU: 2.5000 mg | INHALATION_SOLUTION | Freq: Four times a day (QID) | RESPIRATORY_TRACT | Status: DC | PRN

## 2013-09-11 MED ORDER — ACETAMINOPHEN 650 MG RE SUPP: 650.0000 mg | Freq: Four times a day (QID) | RECTAL | Status: DC | PRN

## 2013-09-11 MED ORDER — CARVEDILOL 6.25 MG PO TABS
6.2500 mg | ORAL_TABLET | Freq: Two times a day (BID) | ORAL | Status: DC
  Administered 2013-09-12 – 2013-09-15 (×6): 6.25 mg via ORAL
  Filled 2013-09-11 (×9): qty 1

## 2013-09-11 MED ORDER — AMLODIPINE BESYLATE 5 MG PO TABS
5.0000 mg | ORAL_TABLET | Freq: Every day | ORAL | Status: DC
Start: 2013-09-12 — End: 2013-09-15
  Administered 2013-09-12 – 2013-09-15 (×3): 5 mg via ORAL
  Filled 2013-09-11 (×4): qty 1

## 2013-09-11 MED ORDER — FUROSEMIDE 40 MG PO TABS
40.0000 mg | ORAL_TABLET | Freq: Every morning | ORAL | Status: DC
  Administered 2013-09-12 – 2013-09-13 (×2): 40 mg via ORAL
  Filled 2013-09-11 (×3): qty 1

## 2013-09-11 MED ORDER — ASPIRIN EC 81 MG PO TBEC
81.0000 mg | DELAYED_RELEASE_TABLET | Freq: Every day | ORAL | Status: DC
  Administered 2013-09-12 – 2013-09-15 (×4): 81 mg via ORAL
  Filled 2013-09-11 (×4): qty 1

## 2013-09-11 MED ORDER — AMIODARONE HCL 200 MG PO TABS
200.0000 mg | ORAL_TABLET | Freq: Every day | ORAL | Status: DC
  Administered 2013-09-12: 200 mg via ORAL
  Filled 2013-09-11: qty 1

## 2013-09-11 MED ORDER — SIMVASTATIN 20 MG PO TABS
20.0000 mg | ORAL_TABLET | Freq: Every day | ORAL | Status: DC
  Administered 2013-09-12 – 2013-09-14 (×3): 20 mg via ORAL
  Filled 2013-09-11 (×4): qty 1

## 2013-09-11 MED ORDER — PANTOPRAZOLE SODIUM 40 MG PO TBEC
40.0000 mg | DELAYED_RELEASE_TABLET | Freq: Every day | ORAL | Status: DC
  Administered 2013-09-12 – 2013-09-15 (×4): 40 mg via ORAL
  Filled 2013-09-11 (×2): qty 1

## 2013-09-11 MED ORDER — DOCUSATE SODIUM 100 MG PO CAPS
100.0000 mg | ORAL_CAPSULE | Freq: Two times a day (BID) | ORAL | Status: DC
  Administered 2013-09-11 – 2013-09-15 (×8): 100 mg via ORAL
  Filled 2013-09-11 (×9): qty 1

## 2013-09-11 MED ORDER — OXYBUTYNIN CHLORIDE ER 5 MG PO TB24
5.0000 mg | ORAL_TABLET | Freq: Every day | ORAL | Status: DC
  Administered 2013-09-12 – 2013-09-15 (×4): 5 mg via ORAL
  Filled 2013-09-11 (×4): qty 1

## 2013-09-11 MED ORDER — TERAZOSIN HCL 1 MG PO CAPS
1.0000 mg | ORAL_CAPSULE | Freq: Every day | ORAL | Status: DC
  Administered 2013-09-11 – 2013-09-14 (×4): 1 mg via ORAL
  Filled 2013-09-11 (×5): qty 1

## 2013-09-11 MED ORDER — LEVOFLOXACIN IN D5W 750 MG/150ML IV SOLN
750.0000 mg | Freq: Once | INTRAVENOUS | Status: AC
  Administered 2013-09-11: 750 mg via INTRAVENOUS
  Filled 2013-09-11: qty 150

## 2013-09-11 MED ORDER — LOTEPREDNOL ETABONATE 0.5 % OP SUSP
1.0000 [drp] | Freq: Every morning | OPHTHALMIC | Status: DC
  Administered 2013-09-12 – 2013-09-15 (×4): 1 [drp] via OPHTHALMIC
  Filled 2013-09-11: qty 5

## 2013-09-11 MED ORDER — SPIRONOLACTONE 12.5 MG HALF TABLET
12.5000 mg | ORAL_TABLET | ORAL | Status: DC
  Administered 2013-09-12: 11:00:00 12.5 mg via ORAL
  Filled 2013-09-11 (×2): qty 1

## 2013-09-11 MED ORDER — FERROUS SULFATE 325 (65 FE) MG PO TABS
325.0000 mg | ORAL_TABLET | Freq: Every day | ORAL | Status: DC
  Administered 2013-09-12 – 2013-09-15 (×4): 325 mg via ORAL
  Filled 2013-09-11 (×5): qty 1

## 2013-09-11 MED ORDER — SODIUM CHLORIDE 0.9 % IJ SOLN
3.0000 mL | Freq: Two times a day (BID) | INTRAMUSCULAR | Status: DC
  Administered 2013-09-11 – 2013-09-15 (×8): 3 mL via INTRAVENOUS

## 2013-09-11 MED ORDER — LOSARTAN POTASSIUM 50 MG PO TABS
50.0000 mg | ORAL_TABLET | Freq: Every day | ORAL | Status: DC
  Administered 2013-09-12 – 2013-09-13 (×2): 50 mg via ORAL
  Filled 2013-09-11 (×3): qty 1

## 2013-09-11 MED ORDER — BENZONATATE 100 MG PO CAPS
100.0000 mg | ORAL_CAPSULE | Freq: Three times a day (TID) | ORAL | Status: DC | PRN
  Filled 2013-09-11: qty 1

## 2013-09-11 NOTE — ED Notes (Signed)
Pt has bruising to left shoulder, pt states bruising has been there since Saturday as well as to his back.

## 2013-09-11 NOTE — ED Notes (Signed)
Pt has fallen 3xs in the last week. Pt states he fell Saturday after getting caught on the step, Tuesday after being caught up with his walker, and today he tripped over his walker. Pt states he did hit his head on the carpet but he reports no injuries. Pt also bradycardic, HR 45. Pt lives home alone. MD requesting to be notified if pt is brought here to ED. Vitals 150/82, 45 HR, 103cbg, 94%.

## 2013-09-11 NOTE — Progress Notes (Signed)
RN called, patient agrees to attempt CPAP--placed patient on auto titration mode with max 20/min 5 via FFM, Oxygen bled in. Patient states doesn't use humidification at home.

## 2013-09-11 NOTE — ED Provider Notes (Signed)
CSN: [id number]     Arrival date & time 09/11/13  1220 History   First MD Initiated Contact with Patient 09/11/13 1254     Chief Complaint  Patient presents with  . Fall   (Consider location/radiation/quality/duration/timing/severity/associated sxs/prior Treatment) The history is provided by the patient, medical records and a caregiver. No language interpreter was used.  \ Eugene [name] is a very pleasant 79 year old male who is brought into the emergency departmentwith a past medical history of coronary artery disease, chronic heart failure, aortic stenosis,peripheral vascular disease, vitamin B12 deficiency, who presents the emergency department for falls.  The patient was sent in by Dr. Kem Kays of the internal medicine teaching service.  The past week the patient has had several falls.  The first being this past weekend.  He tripped over the steps and fell and hit his head and his left shoulder.  Patient had 2 other falls in which he did not hit his head.  He complains of pain in his tailbone.  He has no other complaints.  He is attended by his health care power of attorney, states that the patient has had increasing slurred speech over the past day.  He notices that he has had worsening falls and his ability to get around his house with his walker or is worsening.  He has increased weakness in his legs.  The patient's power of attorney states that he is "declining."  The Patient denies any fevers, chills, chest pain, abdominal pain, nausea or vomiting.  Past Medical History  Diagnosis Date  . Glaucoma     Right eye  . Diverticulosis     multiple hospital admissions for GI bleed, has an episode roughly every 6 months,, last  colonscopy in 2006 showed diverticulosis, was evaluated by Dr. Madilyn Fireman in the hospital in 2011 for lower GI bleed andd it  was suggested that the patient was not actively bleeding at that time and given his comorbid conditions colonoscopy was deferred. He is also chronically  constipated  . Macular degeneration of right eye   . Allergic rhinitis   . CAD (coronary artery disease)     s/p PCI in 1991;  Cardiac catheterization 11/02/11: Ostial LAD 95% with aneurysmal dilatation after this, proximal circumflex 70% with a superior branch 30-40%, proximal RCA 75% and 90%, mid RCA 50% and 90% before a PDA, then occluded.;    high risk for CABG;PCI 11/14/11: Bare-metal stent to the proximal LAD    . OSA on CPAP   . Hypertension   . Hyperlipidemia   . Chronic systolic heart failure     Echocardiogram 11/02/11: Moderate LVH, EF 30-35%, multiple wall motion abnormalities, critical aortic stenosis, AVA 0.6, mean gradient 45, mild MR, severe LAE  . Supraventricular tachycardia     2 syndromes-nonsustained atrial tachycardia//adenosine responsive diuretic positive reentry probably AV node reentry  . Aortic stenosis     s/p AV balloon valvuloplasty by Dr. Excell Seltzer 11/2011  . Ischemic cardiomyopathy   . H/O: GI bleed     recurrent  . Heart murmur   . CHF (congestive heart failure)   . Myocardial infarction 10/28/2011  . Pneumonia 2012  . Exertional shortness of breath   . Hypothyroidism   . GERD (gastroesophageal reflux disease)   . Arthritis     "might have slight; have trouble w/my fingers" (03/30/2013)   Past Surgical History  Procedure Laterality Date  . Implantation of [place]erveldt glaucoma device lant with scleral reinforcement using tutoplast tissue graft right eye.    Marland Kitchen  Coronary angioplasty with stent placement  Jan. 23, 2013  . Corneal transplant    . Cardiac valve replacement  11/30/2011    aortic valve  . Angioplasty Left 03/30/2013    tibial  . Tonsillectomy  1946   Family History  Problem Relation Age of Onset  . Cancer Mother   . Cancer Father   . Heart disease Sister     Heart disease before age 77   History  Substance Use Topics  . Smoking status: Former Smoker -- 3.00 packs/day for 21 years    Types: Cigarettes    Quit date: 10/08/1958  . Smokeless  tobacco: Never Used  . Alcohol Use: 0.6 oz/week    1 Cans of beer per week    Review of Systems  Constitutional: Negative for fever and chills.  Respiratory: Negative for cough and shortness of breath.   Cardiovascular: Negative for chest pain and palpitations.  Gastrointestinal: Negative for vomiting, abdominal pain, diarrhea and constipation.  Genitourinary: Negative for dysuria, urgency and frequency.  Musculoskeletal: Negative for arthralgias and myalgias.  Skin: Negative for rash.  Neurological: Positive for weakness. Negative for dizziness and headaches.   Review of Systems  Constitutional: Negative for fever and chills.  Respiratory: Negative for cough and shortness of breath.   Cardiovascular: Negative for chest pain and palpitations.  Gastrointestinal: Negative for vomiting, abdominal pain, diarrhea and constipation.  Genitourinary: Negative for dysuria, urgency and frequency.  Musculoskeletal: Negative for arthralgias and myalgias.  Skin: Negative for rash.  Neurological: Positive for weakness. Negative for dizziness and headaches.     Allergies  Penicillins  Home Medications   Current Outpatient Rx  Name  Route  Sig  Dispense  Refill  . aspirin EC 81 MG tablet   Oral   Take 81 mg by mouth daily.         . benzonatate (TESSALON PERLES) 100 MG capsule   Oral   Take 1 capsule (100 mg total) by mouth 3 (three) times daily as needed for cough.   30 capsule   1   . Calcium Carbonate-Vitamin D (CALCIUM + D PO)   Oral   Take 1 tablet by mouth daily.          . Cyanocobalamin (VITAMIN B-12 IJ)   Injection   Inject 1,000 mcg as directed daily.         Marland Kitchen docusate sodium (COLACE) 100 MG capsule   Oral   Take 100 mg by mouth 2 (two) times daily.          . ferrous sulfate 325 (65 FE) MG tablet   Oral   Take 325 mg by mouth daily with breakfast.         . furosemide (LASIX) 40 MG tablet   Oral   Take 40 mg by mouth every morning.         .  loteprednol (LOTEMAX) 0.5 % ophthalmic suspension   Right Eye   Place 1 drop into the right eye every morning.          Marland Kitchen oxybutynin (DITROPAN-XL) 5 MG 24 hr tablet   Oral   Take 1 tablet (5 mg total) by mouth daily.   30 tablet   3   . pantoprazole (PROTONIX) 40 MG tablet   Oral   Take 1 tablet (40 mg total) by mouth daily.   90 tablet   2   . spironolactone (ALDACTONE) 25 MG tablet   Oral   Take 0.5 tablets (12.5  mg total) by mouth every other day.   15 tablet   2   . terazosin (HYTRIN) 1 MG capsule   Oral   Take 1 capsule (1 mg total) by mouth at bedtime.   30 capsule   2   . zoster vaccine live, PF, (ZOSTAVAX) 16109 UNT/0.65ML injection   Subcutaneous   Inject 19,400 Units into the skin once.   1 each   0    BP 143/62  Pulse 50  Temp(Src) 97.1 F (36.2 C) (Oral)  Resp 14  SpO2 96% Physical Exam  Nursing note and vitals reviewed. Constitutional: He is oriented to person, place, and time. No distress.  Very pleasant male in no acute distress.  HENT:  Head: Normocephalic and atraumatic.  Eyes: EOM are normal. Pupils are equal, round, and reactive to light.  Neck: No JVD present.  Cardiovascular:  No murmur heard. Bilateral peripheral edema. bradycardic  Pulmonary/Chest: Effort normal and breath sounds normal. He has no wheezes.  Abdominal: Soft. Bowel sounds are normal. He exhibits no distension.  Umbilical hernia   Musculoskeletal: Normal range of motion.  Neurological: He is alert and oriented to person, place, and time.  Skin: Skin is warm and dry.    ED Course  Procedures (including critical care time) Labs Review Labs Reviewed - No data to display Imaging Review No results found.  EKG Interpretation   None       MDM  No diagnosis found. BP 143/62  Pulse 50  Temp(Src) 97.1 F (36.2 C) (Oral)  Resp 14  SpO2 96% Patient with multiple falls.he has an elevated BNP however it appears consistent with the last 3 values.hemoglobin is  elevated since his last reading 5 months ago. BUN appears at baseline creatinine is not elevated. He is normal orthostatic vital signs.shoulder x-ray is negative for acute abnormality. Xray reveals a Left lower lobe CAP. Patient begun on levaquin.    4:17 PM I have spoken with Internal Med Resident Dr. Garald Braver who will  Evaluate the patient and disposition.  Arthor Captain, PA-C 09/13/13 0631  Arthor Captain, PA-C 09/13/13 [phone]

## 2013-09-11 NOTE — Progress Notes (Signed)
Spoke with patient in regards to CPAP HS, pt states does not wish to wear CPAP provided by hospital. RT will assist as needed. RN made aware of refusal.

## 2013-09-11 NOTE — ED Notes (Signed)
Arnold Long [name] (POA) [phone].

## 2013-09-11 NOTE — H&P (Signed)
Date: 09/11/2013               Patient Name:  Eugene [name] MRN: [id number]  DOB: 30-Jan-1924 Age / Sex: 79 y.o., male   PCP: Jonah Blue, DO         Medical Service: Internal Medicine Teaching Service         Attending Physician: Dr. Burns Spain, MD    First Contact: Dr. Angelina Sheriff, MD Pager: [phone]  Second Contact: Dr. Leonia Reeves, MD Pager: [phone]       After Hours (After 5p/  First Contact Pager: [phone]  weekends / holidays): Second Contact Pager: [phone]   Chief Complaint: Recurrent Falls  History of Present Illness: Eugene [name] is a 79 y.o. man w/ a complicated pmhx detailed below who presents with 3rd fall in the last week.  All three falls appear to be 2/2 to tripping on slick floor, over stairs and over his other foot. His most recent fall was this morning and it occurred when he was trying to get to the door to get his meals on wheels. He reports that he slipped on the slick kitchen floor. This caused him to [facility] on his tailbone. He was unable to get up so he called a friend who called EMS. EMS arrived on the scene and found his HR to be 45. He was then brought the the ED for evaluation. He denies SOB, CP, lightheadedness, dizziness, LOC, weakness, incontinence of bowel or bladder. The patient is eating and drinking without problems. He is moving his bowels and bladder without problems. He denies palpitations. He states that his normal HR is 60.  Associated symptoms of left foot pain and wound for 1 week.  Meds: Current Facility-Administered Medications  Medication Dose Route Frequency Provider Last Rate Last Dose  . levofloxacin (LEVAQUIN) IVPB 750 mg  750 mg Intravenous Once Arthor Captain, PA-C 100 mL/hr at 09/11/13 1639 750 mg at 09/11/13 1639   Current Outpatient Prescriptions  Medication Sig Dispense Refill  . amiodarone (PACERONE) 200 MG tablet Take 200 mg by mouth daily.      Marland Kitchen amLODipine (NORVASC) 5 MG tablet Take 5 mg by mouth daily.        Marland Kitchen aspirin EC 81 MG tablet Take 81 mg by mouth daily.      . benzonatate (TESSALON PERLES) 100 MG capsule Take 1 capsule (100 mg total) by mouth 3 (three) times daily as needed for cough.  30 capsule  1  . Calcium Carbonate-Vitamin D (CALCIUM + D PO) Take 1 tablet by mouth daily.       . carvedilol (COREG) 6.25 MG tablet Take 6.25 mg by mouth 2 (two) times daily with a meal.      . Cyanocobalamin (VITAMIN B-12 IJ) Inject 1,000 mcg as directed every 7 (seven) days. Every tuesday      . docusate sodium (COLACE) 100 MG capsule Take 100 mg by mouth 2 (two) times daily.       . ferrous sulfate 325 (65 FE) MG tablet Take 325 mg by mouth daily with breakfast.      . furosemide (LASIX) 40 MG tablet Take 40 mg by mouth every morning.      Marland Kitchen levothyroxine (SYNTHROID, LEVOTHROID) 25 MCG tablet Take 25 mcg by mouth daily before breakfast.      . losartan (COZAAR) 50 MG tablet Take 50 mg by mouth daily.      Marland Kitchen loteprednol (LOTEMAX) 0.5 % ophthalmic suspension Place  1 drop into the right eye every morning.       Marland Kitchen oxybutynin (DITROPAN-XL) 5 MG 24 hr tablet Take 1 tablet (5 mg total) by mouth daily.  30 tablet  3  . pantoprazole (PROTONIX) 40 MG tablet Take 1 tablet (40 mg total) by mouth daily.  90 tablet  2  . pravastatin (PRAVACHOL) 40 MG tablet Take 40 mg by mouth daily.      Marland Kitchen spironolactone (ALDACTONE) 25 MG tablet Take 0.5 tablets (12.5 mg total) by mouth every other day.  15 tablet  2  . terazosin (HYTRIN) 1 MG capsule Take 1 capsule (1 mg total) by mouth at bedtime.  30 capsule  2  . zoster vaccine live, PF, (ZOSTAVAX) 16109 UNT/0.65ML injection Inject 19,400 Units into the skin once.  1 each  0  . ciprofloxacin (CIPRO) 250 MG tablet Take 250 mg by mouth 2 (two) times daily. For 5 days        Allergies: Allergies as of 09/11/2013 - Review Complete 09/11/2013  Allergen Reaction Noted  . Penicillins Swelling    Past Medical History  Diagnosis Date  . Glaucoma     Right eye  . Diverticulosis      multiple hospital admissions for GI bleed, has an episode roughly every 6 months,, last  colonscopy in 2006 showed diverticulosis, was evaluated by Dr. Madilyn Fireman in the hospital in 2011 for lower GI bleed andd it  was suggested that the patient was not actively bleeding at that time and given his comorbid conditions colonoscopy was deferred. He is also chronically constipated  . Macular degeneration of right eye   . Allergic rhinitis   . CAD (coronary artery disease)     s/p PCI in 1991;  Cardiac catheterization 11/02/11: Ostial LAD 95% with aneurysmal dilatation after this, proximal circumflex 70% with a superior branch 30-40%, proximal RCA 75% and 90%, mid RCA 50% and 90% before a PDA, then occluded.;    high risk for CABG;PCI 11/14/11: Bare-metal stent to the proximal LAD    . OSA on CPAP   . Hypertension   . Hyperlipidemia   . Chronic systolic heart failure     Echocardiogram 11/02/11: Moderate LVH, EF 30-35%, multiple wall motion abnormalities, critical aortic stenosis, AVA 0.6, mean gradient 45, mild MR, severe LAE  . Supraventricular tachycardia     2 syndromes-nonsustained atrial tachycardia//adenosine responsive diuretic positive reentry probably AV node reentry  . Aortic stenosis     s/p AV balloon valvuloplasty by Dr. Excell Seltzer 11/2011  . Ischemic cardiomyopathy   . H/O: GI bleed     recurrent  . Heart murmur   . CHF (congestive heart failure)   . Myocardial infarction 10/28/2011  . Pneumonia 2012  . Exertional shortness of breath   . Hypothyroidism   . GERD (gastroesophageal reflux disease)   . Arthritis     "might have slight; have trouble w/my fingers" (03/30/2013)   Past Surgical History  Procedure Laterality Date  . Implantation of baerveldt glaucoma device lant with scleral reinforcement using tutoplast tissue graft right eye.    . Coronary angioplasty with stent placement  Jan. 23, 2013  . Corneal transplant    . Cardiac valve replacement  11/30/2011    aortic valve  .  Angioplasty Left 03/30/2013    tibial  . Tonsillectomy  1946   Family History  Problem Relation Age of Onset  . Cancer Mother   . Cancer Father   . Heart disease Sister  Heart disease before age 59   History   Social History  . Marital Status: Widowed    Spouse Name: N/A    Number of Children: N/A  . Years of Education: N/A   Occupational History  . Not on file.   Social History Main Topics  . Smoking status: Former Smoker -- 3.00 packs/day for 21 years    Types: Cigarettes    Quit date: 10/08/1958  . Smokeless tobacco: Never Used  . Alcohol Use: 0.6 oz/week    1 Cans of beer per week  . Drug Use: No  . Sexual Activity: No   Other Topics Concern  . Not on file   Social History Narrative   Lives at the [facility] nursing [facility]. His pastor is his HCPOA. His daugther has been removed because she use $500k from his estate without his approval.  He also has a close friend who takes him for his doctor's appointments and is with him during the hospital admission. 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 Never smoked or drank alcohol in past 40 years. No illicit drug use. Has Medicare.    Review of Systems: Pertinent items are noted in HPI.  Physical Exam: Blood pressure 126/76, pulse 44, temperature 97.1 F (36.2 C), temperature source Oral, resp. rate 17, SpO2 95.00%. Physical Exam  Constitutional: He is oriented to person, place, and time. He appears well-developed and well-nourished. No distress.  HENT:  Head: Normocephalic.  Mouth/Throat: Oropharynx is clear and moist. No oropharyngeal exudate.  Eyes: Conjunctivae and EOM are normal.  OD: s/p K transplant. Fixed irregular pupil. OS: Normal pupillary reflex  Cardiovascular: Normal rate, regular rhythm, normal heart sounds and intact distal pulses.  Exam reveals no friction rub.   No murmur heard. Pulmonary/Chest: Effort normal. No respiratory distress. He has wheezes. He  has no rales.  Abdominal: Soft. Bowel sounds are normal. He exhibits no distension. There is no tenderness. There is no rebound and no guarding.  Musculoskeletal: He exhibits edema.       Arms:      Feet:  Ecchymoses over left shoulder.  Eschar with central drainage from very small wound. Discharge is serosanguinous. No surrounding erythema. Mildly painful to palpation.  Neurological: He is alert and oriented to person, place, and time.  Skin: He is not diaphoretic.  Psychiatric: He has a normal mood and affect. His behavior is normal.     Lab results: Basic Metabolic Panel:  Recent Labs  16/10/96 1258  NA 138  K 4.4  CL 102  CO2 27  GLUCOSE 92  BUN 31*  CREATININE 1.22  CALCIUM 9.2   Liver Function Tests:  Recent Labs  09/11/13 1258  AST 15  ALT 14  ALKPHOS 80  BILITOT 0.5  PROT 6.3  ALBUMIN 3.5   CBC:  Recent Labs  09/11/13 1258  WBC 6.9  NEUTROABS 4.8  HGB 13.9  HCT 41.6  MCV 94.3  PLT 172   BNP:  Recent Labs  09/11/13 1316  PROBNP 1443.0*   Urine Drug Screen: Drugs of Abuse     Component Value Date/Time   LABOPIA NONE DETECTED 11/23/2010 0646   COCAINSCRNUR NONE DETECTED 11/23/2010 0646   LABBENZ NONE DETECTED 11/23/2010 0646   AMPHETMU NONE DETECTED 11/23/2010 0646   THCU NONE DETECTED 11/23/2010 0646   LABBARB  Value: NONE DETECTED        DRUG SCREEN FOR MEDICAL PURPOSES ONLY.  IF CONFIRMATION  IS NEEDED FOR ANY PURPOSE, NOTIFY LAB WITHIN 5 DAYS.        LOWEST DETECTABLE LIMITS FOR URINE DRUG SCREEN Drug Class       Cutoff (ng/mL) Amphetamine      1000 Barbiturate      200 Benzodiazepine   200 Tricyclics       300 Opiates          300 Cocaine          300 THC              50 11/23/2010 0646    Urinalysis:  Recent Labs  09/11/13 1420  COLORURINE YELLOW  LABSPEC 1.011  PHURINE 5.0  GLUCOSEU NEGATIVE  HGBUR NEGATIVE  BILIRUBINUR NEGATIVE  KETONESUR NEGATIVE  PROTEINUR NEGATIVE  UROBILINOGEN 0.2  NITRITE NEGATIVE  LEUKOCYTESUR NEGATIVE    Imaging results:  Dg Chest 2 View  09/11/2013   CLINICAL DATA:  Cough.  Cough.  EXAM: CHEST  2 VIEW  COMPARISON:  Chest x-ray 10/01/2012. Marland Kitchen  FINDINGS: Mild infiltrate left lower lobe cannot be excluded. Lungs are otherwise clear of acute infiltrates. Mild subsegmental atelectasis present both lung bases. No pleural effusion and pneumothorax. Large airways and hilar structures normal. Cardiomegaly, normal pulmonary vascularity. Degenerative changes both shoulders. No acute bony abnormality  IMPRESSION: Mild left lower lobe infiltrate consistent with pneumonia.   Electronically Signed   By: Maisie Fus  Register   On: 09/11/2013 13:59   Ct Head Wo Contrast  09/11/2013   CLINICAL DATA:  79 year old male status post fall. Head injury. Severe headache. Initial encounter.  EXAM: CT HEAD WITHOUT CONTRAST  CT CERVICAL SPINE WITHOUT CONTRAST  TECHNIQUE: Multidetector CT imaging of the head and cervical spine was performed following the standard protocol without intravenous contrast. Multiplanar CT image reconstructions of the cervical spine were also generated.  COMPARISON:  10/01/2012.  FINDINGS: CT HEAD FINDINGS  Stable paranasal sinuses and mastoids. Left superior forehead scalp hematoma measuring up to 10 mm in thickness. Underlying left frontal bone intact. Calvarium stable and intact. Stable orbits soft tissues, postoperative changes on the right.  Calcified atherosclerosis at the skull base. Stable dural calcifications. Stable cerebral volume. No ventriculomegaly. Tortuous intracranial artery is re- identified. No suspicious intracranial vascular hyperdensity. No intracranial mass effect. Patchy white matter hypodensity is stable. No evidence of cortically based acute infarction identified. No acute intracranial hemorrhage identified.  CT CERVICAL SPINE FINDINGS  Chronic bulky flowing anterior endplate osteophytes in the cervical spine. Stable straightening of cervical lordosis. *INSERT* skullbase partially  calcified ligamentous hypertrophy re- identified about the odontoid. Cervicothoracic junction alignment is within normal limits. Bilateral posterior element alignment is within normal limits. Multilevel facet hypertrophy. No acute cervical spine fracture identified.  Stable lung apices. Grossly stable and intact visualized upper thoracic levels. Widespread arterial calcified atherosclerosis. Partially retropharyngeal course of the right common carotid artery. Otherwise negative paraspinal soft tissues.  IMPRESSION: 1. Left scalp hematoma without underlying fracture.  2. No acute intracranial abnormality.  3. No acute fracture or listhesis identified in the cervical spine. Ligamentous injury is not excluded. Diffuse idiopathic skeletal hyperostosis .   Electronically Signed   By: Augusto Gamble M.D.   On: 09/11/2013 15:30   Ct Cervical Spine Wo Contrast  09/11/2013   CLINICAL DATA:  79 year old male status post fall. Head injury. Severe headache. Initial encounter.  EXAM: CT HEAD WITHOUT CONTRAST  CT CERVICAL SPINE WITHOUT CONTRAST  TECHNIQUE: Multidetector CT imaging of the head and cervical spine was performed following the  standard protocol without intravenous contrast. Multiplanar CT image reconstructions of the cervical spine were also generated.  COMPARISON:  10/01/2012.  FINDINGS: CT HEAD FINDINGS  Stable paranasal sinuses and mastoids. Left superior forehead scalp hematoma measuring up to 10 mm in thickness. Underlying left frontal bone intact. Calvarium stable and intact. Stable orbits soft tissues, postoperative changes on the right.  Calcified atherosclerosis at the skull base. Stable dural calcifications. Stable cerebral volume. No ventriculomegaly. Tortuous intracranial artery is re- identified. No suspicious intracranial vascular hyperdensity. No intracranial mass effect. Patchy white matter hypodensity is stable. No evidence of cortically based acute infarction identified. No acute intracranial  hemorrhage identified.  CT CERVICAL SPINE FINDINGS  Chronic bulky flowing anterior endplate osteophytes in the cervical spine. Stable straightening of cervical lordosis. *INSERT* skullbase partially calcified ligamentous hypertrophy re- identified about the odontoid. Cervicothoracic junction alignment is within normal limits. Bilateral posterior element alignment is within normal limits. Multilevel facet hypertrophy. No acute cervical spine fracture identified.  Stable lung apices. Grossly stable and intact visualized upper thoracic levels. Widespread arterial calcified atherosclerosis. Partially retropharyngeal course of the right common carotid artery. Otherwise negative paraspinal soft tissues.  IMPRESSION: 1. Left scalp hematoma without underlying fracture.  2. No acute intracranial abnormality.  3. No acute fracture or listhesis identified in the cervical spine. Ligamentous injury is not excluded. Diffuse idiopathic skeletal hyperostosis .   Electronically Signed   By: Augusto Gamble M.D.   On: 09/11/2013 15:30   Dg Shoulder Left  09/11/2013   CLINICAL DATA:  Fall.  EXAM: LEFT SHOULDER - 2+ VIEW  COMPARISON:  Chest x-ray 09/11/2013.  FINDINGS: Degenerative changes left shoulder involving the glenohumeral joint and acromioclavicular joint. Left shoulder is high-riding suggesting chronic rotator cuff tear. No acute abnormality identified. No evidence of fracture, dislocation, or separation. Tiny metallic foreign body noted in the anterior aspect of the left upper arm soft tissues. This is most likely old, no evidence of adjacent acute soft tissue injury.  IMPRESSION: 1. DJD, no acute abnormality. Probable chronic left rotator cuff tear.  2.  No evidence of fracture, dislocation, or separation.   Electronically Signed   By: Maisie Fus  Register   On: 09/11/2013 14:44    Other results: EKG: sinus bradycardia, nonspecific IVCD   Assessment & Plan by Problem: Principal Problem:   Multiple falls Active Problems:    HYPERLIPIDEMIA   Chronic systolic heart failure   Bradycardia    CAD (coronary artery disease)   Shortness of breath   Abnormality of gait   Ulcer of lower limb, unspecified- Bilateral foot  # Falls in the setting of chronic bradycardia The patient has had multiple falls. I believe that these are due to deconditioning and worsening foot pain. I believe that the patient requires inpatient rehab before he will be safe for discharge home. It is unlikely but possible that this is due to symptomatic bradycardia. However, this bradycardia appears chronic and unchanged over the last few years  - PT OT Eval and treat - Wound Care left foot wound - Admit to tele and monitor HR and signs of symptomatic bradycardia - Consult SW for placement in SNF - F/U CBC andBMP - F/U Sacrum and Coccyx Xray - Falls precautions and strict bed rest until PT OT eval - Tylenol prn pain  # SOB The patient has chronic SOB at baseline and mild wheezing on physical exam.CXR demonstrated a opacity of the left lower lobe (mild) that has been visualized before. There is concern for PNA. Patient  received levofloxacin in the ED. Will consider continuing abx in AM.   -Duonebs with albuterol -Tessalon  # CHF (last EF 65% with severe AS, previous 40-45%) in setting of CAS HTN and HLD - Continue home statin - losartan, spironolactone, amlodipine, furosemide,  ASA 81 mg  #PAD Balloon angioplasty of porximal left anterior tibial artery in June 2014.  # Paroxysmal afib - amiodarone 200 mg qd -  Not anticoagulated at this time due to multiple recurrent diverticular GI bleeds  #OSA Continue home CPAP.    DVT PPx: Heparin sq Nutrition: Heart Healthy  Dispo: Disposition is deferred at this time, awaiting improvement of current medical problems. Anticipated discharge in approximately 2-3 day(s).   The patient does have a current PCP Jonah Blue, DO) and does need an [facility] hospital follow-up appointment after  discharge.  The patient does have transportation limitations that hinder transportation to clinic appointments.  Signed: Pleas Koch, MD 09/11/2013, 5:02 PM

## 2013-09-12 ENCOUNTER — Other Ambulatory Visit: Payer: Self-pay

## 2013-09-12 LAB — CBC
HCT: 40 % (ref 39.0–52.0)
Hemoglobin: 13.1 g/dL (ref 13.0–17.0)
MCHC: 32.8 g/dL (ref 30.0–36.0)
Platelets: 169 10*3/uL (ref 150–400)
RDW: 14.3 % (ref 11.5–15.5)
WBC: 6.6 10*3/uL (ref 4.0–10.5)

## 2013-09-12 LAB — BASIC METABOLIC PANEL
BUN: 27 mg/dL — ABNORMAL HIGH (ref 6–23)
Calcium: 9 mg/dL (ref 8.4–10.5)
Chloride: 104 mEq/L (ref 96–112)
Creatinine, Ser: 1.17 mg/dL (ref 0.50–1.35)
GFR calc Af Amer: 63 mL/min — ABNORMAL LOW (ref >90)
Glucose, Bld: 84 mg/dL (ref 70–99)

## 2013-09-12 MED ORDER — AMIODARONE HCL 100 MG PO TABS
100.0000 mg | ORAL_TABLET | Freq: Every day | ORAL | Status: DC
  Administered 2013-09-13 – 2013-09-15 (×3): 100 mg via ORAL
  Filled 2013-09-12 (×3): qty 1

## 2013-09-12 NOTE — H&P (Signed)
  Date: 09/12/2013  Patient name: Eugene [name]  Medical record number: [id number]  Date of birth: 1924-09-26   I have seen and evaluated Eugene [name] and discussed their care with the Residency Team.   Assessment and Plan: I have seen and evaluated the patient as outlined above. I agree with the formulated Assessment and Plan as detailed in the residents' admission note, with the following changes:   1. Frequent Falls - These resulted in no fracture although he does have hematomas and soft tissue injury. No cause found on lab testing. Will need PT / OT and likely SNF and pt is willing to follow this tx course.   2. Asymptomatic bradycardia - pt has been brady since 2011 with HR in 50 - 60. This is his rate today. Will decreased amiodarone and run by his cardiologist.   3. Home care - pt has a good friend and a retired Optician, dispensing that help in home and drive him to appts. We can offer [facility] and [facility] if he wishes to consider these resources once he is D/C'd SNF  4. B 12 def - his level was low end of Oct 2014. He had B 12 shots Q weekly on his admission meds. Will need repeat level to ensure level was repleted.  Burns Spain, MD 12/6/201412:05 PM

## 2013-09-12 NOTE — Evaluation (Signed)
Physical Therapy Evaluation Patient Details Name: Eugene [name] MRN: [id number] DOB: 08-Apr-1924 Today's Date: 09/12/2013 Time: 4098-1191 PT Time Calculation (min): 16 min  PT Assessment / Plan / Recommendation History of Present Illness   Pt is a 79 y.o. Male adm from home, where he lives alone, secondary to frequent falls. Pt with bradycardia. PMH includes HTN, glaucoma, CHF, aortic stenonsis, ischemic cardiomyopathy, GERD, hypothyroidism, MI.   Clinical Impression  Pt adm due to the above. Presents with decreased independence with functional mobility. Pt to benefit from skilled Pt to address deficits listed below. Pt agreeable to ST SNF upon acute D/C. Will benefit due to decreased caregiver support and fall risk.      PT Assessment  Patient needs continued PT services    Follow Up Recommendations  SNF    Does the patient have the potential to tolerate intense rehabilitation      Barriers to Discharge Decreased caregiver support pt lives alone    Equipment Recommendations  Other (comment) (TBD)    Recommendations for Other Services     Frequency Min 3X/week    Precautions / Restrictions Precautions Precautions: Fall Precaution Comments: recurrent falls; fell 3 times last week  Restrictions Weight Bearing Restrictions: No   Pertinent Vitals/Pain No c/o pain.       Mobility  Bed Mobility Bed Mobility: Supine to Sit;Sitting - Scoot to Edge of Bed Supine to Sit: 4: Min assist;HOB elevated;With rails Sitting - Scoot to Edge of Bed: 6: Modified independent (Device/Increase time) Details for Bed Mobility Assistance: pt required (A) to bring trunk to upright sitting position on EOB Transfers Transfers: Stand to Sit;Sit to Stand Sit to Stand: 4: Min assist;From bed;With upper extremity assist Stand to Sit: 4: Min assist;To chair/3-in-1;With armrests;With upper extremity assist Details for Transfer Assistance: (A) to maintain balance when transferring ; cues for hand  placement and safety with RW   Ambulation/Gait Ambulation/Gait Assistance: 4: Min assist Ambulation Distance (Feet): 70 Feet Assistive device: 2 person hand held assist Ambulation/Gait Assistance Details: pt tends to lean to Rt with ambulation; unsteady gt; pt with fwd flexed Rt LE during ambulation; Gait Pattern: Step-through pattern;Decreased stride length;Wide base of support Gait velocity: decreased  Stairs: No Wheelchair Mobility Wheelchair Mobility: No         PT Diagnosis: Difficulty walking  PT Problem List: Decreased activity tolerance;Decreased balance;Decreased mobility;Decreased knowledge of use of DME;Decreased safety awareness PT Treatment Interventions: DME instruction;Gait training;Therapeutic activities;Functional mobility training;Therapeutic exercise;Balance training;Neuromuscular re-education;Patient/family education     PT Goals(Current goals can be found in the care plan section) Acute Rehab PT Goals Patient Stated Goal: to stop falling  PT Goal Formulation: With patient Time For Goal Achievement: 09/19/13 Potential to Achieve Goals: Good  Visit Information  Last PT Received On: 09/12/13 Assistance Needed: +1       Prior Functioning  Home Living Family/patient expects to be discharged to:: Private residence Living Arrangements: Alone Available Help at Discharge: Friend(s);Available PRN/intermittently Type of Home: House Home Access: Stairs to enter [employer] of Steps: 3 Entrance Stairs-Rails: Can reach both Additional Comments: pt agreeable to SNF prior to returning home  Prior Function Level of Independence: Independent with assistive device(s) Communication Communication: HOH Dominant Hand: Right    Cognition  Cognition Arousal/Alertness: Awake/alert Behavior During Therapy: WFL for tasks assessed/performed Overall Cognitive Status: Within Functional Limits for tasks assessed    Extremity/Trunk Assessment Upper Extremity  Assessment Upper Extremity Assessment: Overall WFL for tasks assessed Lower Extremity Assessment Lower Extremity Assessment: Overall  WFL for tasks assessed Cervical / Trunk Assessment Cervical / Trunk Assessment: Normal   Balance Balance Balance Assessed: Yes Static Sitting Balance Static Sitting - Balance Support: Feet supported;No upper extremity supported Static Sitting - Level of Assistance: 5: Stand by assistance  End of Session PT - End of Session Equipment Utilized During Treatment: Gait belt Activity Tolerance: Patient tolerated treatment well Patient left: in chair;with call bell/phone within reach Nurse Communication: Mobility status  GP     Donell Sievert, [place] [phone] 09/12/2013, 1:05 PM

## 2013-09-12 NOTE — Progress Notes (Signed)
Clinical Social Work Department BRIEF PSYCHOSOCIAL ASSESSMENT 09/12/2013  Patient:  Eugene [name], Eugene [name]     Account Number:  [id number]     Admit date:  09/11/2013  Clinical Social Worker:  Jacelyn Grip  Date/Time:  09/12/2013 02:45 PM  Referred by:  Physician  Date Referred:  09/12/2013 Referred for  SNF Placement   Other Referral:   Interview type:  Patient Other interview type:    PSYCHOSOCIAL DATA Living Status:  ALONE Admitted from facility:   Level of care:   Primary support name:  [name] [name]/POA/[phone] Primary support relationship to patient:  FRIEND Degree of support available:   adequate    CURRENT CONCERNS Current Concerns  Post-Acute Placement   Other Concerns:    SOCIAL WORK ASSESSMENT / PLAN CSW received referral for New SNF.    CSW  met with pt at bedside to discuss. Pt discussed that he lives alone and recognizes that he needs rehab at [facility] prior to returning to home. CSW discussed process of SNF search. Pt wants to go to [facility], but agreeable to [facility] search in order to have secondary options in the event that [facility] is full.    CSW completed FL2 and initiated SNF search to [facility].    CSW to follow up with pt re: bed offers.    CSW to continue to follow and facilitate pt discharge needs when pt medically ready for discharge.   Assessment/plan status:  Psychosocial Support/Ongoing Assessment of Needs Other assessment/ plan:   discharge planning   Information/referral to community resources:   [facility] list    PATIENT'S/FAMILY'S RESPONSE TO PLAN OF CARE: Pt alert and oriented x 4. Pt familiar with process of SNF placement and hopeful that he will be able to go to [facility] as he has been there three times in the past.    Jacklynn Lewis, MSW, LCSWA (weekend coverage) Clinical Social Work [phone]

## 2013-09-12 NOTE — Progress Notes (Signed)
Clinical Social Work Department CLINICAL SOCIAL WORK PLACEMENT NOTE 09/12/2013  Patient:  Eugene [name], Eugene [name]  Account Number:  [id number] Admit date:  09/11/2013  Clinical Social Worker:  Jacelyn Grip  Date/time:  09/12/2013 02:55 PM  Clinical Social Work is seeking post-discharge placement for this patient at the following level of care:   SKILLED NURSING   (*CSW will update this form in Epic as items are completed)   09/12/2013  Patient/family provided with [facility] Department of Clinical Social Work's list of facilities offering this level of care within the geographic area requested by the patient (or if unable, by the patient's family).  09/12/2013  Patient/family informed of their freedom to choose among providers that offer the needed level of care, that participate in Medicare, Medicaid or managed care program needed by the patient, have an available bed and are willing to accept the patient.  09/12/2013  Patient/family informed of [facility]' ownership interest in [facility], as well as of the fact that they are under no obligation to receive care at this facility.  PASARR submitted to EDS on 09/12/2013 PASARR number received from EDS on 09/12/2013  FL2 transmitted to all facilities in geographic area requested by pt/family on  09/12/2013 FL2 transmitted to all facilities within larger geographic area on   Patient informed that his/her managed care company has contracts with or will negotiate with  certain facilities, including the following:     Patient/family informed of bed offers received:   Patient chooses bed at  Physician recommends and patient chooses bed at    Patient to be transferred to  on   Patient to be transferred to facility by   The following physician request were entered in Epic:   Additional Comments:   Jacklynn Lewis, MSW, LCSWA (weekend coverage) Clinical Social Work [phone]

## 2013-09-12 NOTE — Progress Notes (Signed)
Nutrition Brief Note  Patient identified on the Malnutrition Screening Tool (MST) Report  Wt Readings from Last 15 Encounters:  09/11/13 202 lb 9.6 oz (91.9 kg)  09/08/13 210 lb 4.8 oz (95.391 kg)  08/12/13 212 lb (96.163 kg)  06/25/13 206 lb 12.8 oz (93.804 kg)  06/02/13 209 lb (94.802 kg)  04/29/13 205 lb (92.987 kg)  03/31/13 202 lb 9.6 oz (91.9 kg)  03/31/13 202 lb 9.6 oz (91.9 kg)  03/31/13 202 lb 9.6 oz (91.9 kg)  03/25/13 202 lb (91.627 kg)  03/12/13 203 lb (92.08 kg)  02/26/13 202 lb 9.6 oz (91.899 kg)  02/20/13 203 lb (92.08 kg)  11/27/12 211 lb 11.2 oz (96.026 kg)  11/07/12 203 lb (92.08 kg)    Body mass index is 43.83 kg/(m^2). Patient meets criteria for morbid obesity based on current BMI.   Current diet order is Heart healthy, patient is consuming approximately 100% of meals at this time. Labs and medications reviewed.   Pt reports 40 lbs wt loss since April 2014.  This wt loss is not supported by chart review, which shows variable weight with overall stability around 205 lbs. Pt currently eating well without need for nutrition interventions at this time. If nutrition issues arise, please consult RD.   Loyce Dys, MS RD LDN Clinical Inpatient Dietitian Pager: [phone] Weekend/After hours pager: [phone]

## 2013-09-12 NOTE — Progress Notes (Addendum)
Subjective:  NAE ON. Patient is sitting comfortably in bed without complaint.   Objective: Vital signs in last 24 hours: Filed Vitals:   09/11/13 2256 09/12/13 0232 09/12/13 0758 09/12/13 1006  BP: 143/63 117/51 144/69 102/41  Pulse: 52 50 55 46  Temp: 97.3 F (36.3 C) 98.1 F (36.7 C) 97.8 F (36.6 C) 97.7 F (36.5 C)  TempSrc: Oral Axillary Oral Oral  Resp: 18 20 20 20   Height:      Weight:   201 lb 1 oz (91.2 kg)   SpO2: 95% 98% 92% 94%   Weight change:   Intake/Output Summary (Last 24 hours) at 09/12/13 1051 Last data filed at 09/12/13 0849  Gross per 24 hour  Intake    776 ml  Output    450 ml  Net    326 ml   Constitutional: He is oriented to person, place, and time. He appears well-developed and well-nourished. No distress.  HENT:  Head: Normocephalic.  Mouth/Throat: Oropharynx is clear and moist. No oropharyngeal exudate.  Eyes: Conjunctivae and EOM are normal.  OD: s/p K transplant. Fixed irregular pupil. OS: Normal pupillary reflex  Cardiovascular: Normal rate, regular rhythm, normal heart sounds and intact distal pulses. Exam reveals no friction rub.  No murmur heard.  Pulmonary/Chest: Effort normal. No respiratory distress. He has wheezes. He has no rales.  Abdominal: Soft. Bowel sounds are normal. He exhibits no distension. There is no tenderness. There is no rebound and no guarding.  Musculoskeletal: He exhibits edema.  Arms: Feet:  Ecchymoses over left shoulder.  Eschar with central drainage from very small wound. Discharge is serosanguinous. No surrounding erythema. Mildly painful to palpation.  Neurological: He is alert and oriented to person, place, and time.  Skin: He is not diaphoretic.  Psychiatric: He has a normal mood and affect. His behavior is normal.   Lab Results: Basic Metabolic Panel:  Recent Labs Lab 09/11/13 1258 09/12/13 0414  NA 138 139  K 4.4 4.0  CL 102 104  CO2 27 27  GLUCOSE 92 84  BUN 31* 27*  CREATININE 1.22  1.17  CALCIUM 9.2 9.0   Liver Function Tests:  Recent Labs Lab 09/11/13 1258  AST 15  ALT 14  ALKPHOS 80  BILITOT 0.5  PROT 6.3  ALBUMIN 3.5   CBC:  Recent Labs Lab 09/11/13 1258 09/12/13 0414  WBC 6.9 6.6  NEUTROABS 4.8  --   HGB 13.9 13.1  HCT 41.6 40.0  MCV 94.3 95.7  PLT 172 169   Cardiac Enzymes: No results found for this basename: CKTOTAL, CKMB, CKMBINDEX, TROPONINI,  in the last 168 hours BNP:  Recent Labs Lab 09/11/13 1316  PROBNP 1443.0*   Urine Drug Screen: Drugs of Abuse     Component Value Date/Time   LABOPIA NONE DETECTED 11/23/2010 0646   COCAINSCRNUR NONE DETECTED 11/23/2010 0646   LABBENZ NONE DETECTED 11/23/2010 0646   AMPHETMU NONE DETECTED 11/23/2010 0646   THCU NONE DETECTED 11/23/2010 0646   LABBARB  Value: NONE DETECTED        DRUG SCREEN FOR MEDICAL PURPOSES ONLY.  IF CONFIRMATION IS NEEDED FOR ANY PURPOSE, NOTIFY LAB WITHIN 5 DAYS.        LOWEST DETECTABLE LIMITS FOR URINE DRUG SCREEN Drug Class       Cutoff (ng/mL) Amphetamine      1000 Barbiturate      200 Benzodiazepine   200 Tricyclics       300 Opiates  300 Cocaine          300 THC              50 11/23/2010 0646    Urinalysis:  Recent Labs Lab 09/11/13 1420  COLORURINE YELLOW  LABSPEC 1.011  PHURINE 5.0  GLUCOSEU NEGATIVE  HGBUR NEGATIVE  BILIRUBINUR NEGATIVE  KETONESUR NEGATIVE  PROTEINUR NEGATIVE  UROBILINOGEN 0.2  NITRITE NEGATIVE  LEUKOCYTESUR NEGATIVE    Studies/Results: Dg Chest 2 View  09/11/2013   CLINICAL DATA:  Cough.  Cough.  EXAM: CHEST  2 VIEW  COMPARISON:  Chest x-ray 10/01/2012. Marland Kitchen  FINDINGS: Mild infiltrate left lower lobe cannot be excluded. Lungs are otherwise clear of acute infiltrates. Mild subsegmental atelectasis present both lung bases. No pleural effusion and pneumothorax. Large airways and hilar structures normal. Cardiomegaly, normal pulmonary vascularity. Degenerative changes both shoulders. No acute bony abnormality  IMPRESSION: Mild left  lower lobe infiltrate consistent with pneumonia.   Electronically Signed   By: Maisie Fus  Register   On: 09/11/2013 13:59   Dg Sacrum/coccyx  09/11/2013   CLINICAL DATA:  Sacrococcygeal pain following a fall.  EXAM: SACRUM AND COCCYX - 2+ VIEW  COMPARISON:  Pelvic CT dated 10/21/2012.  FINDINGS: Mild posterior subluxation of the distal coccygeal segment is again demonstrated. No acute fracture or subluxation seen. The previously demonstrated L4 superior endplate compression deformity and grade 1 anterolisthesis at the L4-5 level are unchanged. Facet degenerative changes throughout the lower lumbar spine.  IMPRESSION: No acute fracture or subluxation.   Electronically Signed   By: Gordan Payment M.D.   On: 09/11/2013 19:00   Ct Head Wo Contrast  09/11/2013   CLINICAL DATA:  79 year old male status post fall. Head injury. Severe headache. Initial encounter.  EXAM: CT HEAD WITHOUT CONTRAST  CT CERVICAL SPINE WITHOUT CONTRAST  TECHNIQUE: Multidetector CT imaging of the head and cervical spine was performed following the standard protocol without intravenous contrast. Multiplanar CT image reconstructions of the cervical spine were also generated.  COMPARISON:  10/01/2012.  FINDINGS: CT HEAD FINDINGS  Stable paranasal sinuses and mastoids. Left superior forehead scalp hematoma measuring up to 10 mm in thickness. Underlying left frontal bone intact. Calvarium stable and intact. Stable orbits soft tissues, postoperative changes on the right.  Calcified atherosclerosis at the skull base. Stable dural calcifications. Stable cerebral volume. No ventriculomegaly. Tortuous intracranial artery is re- identified. No suspicious intracranial vascular hyperdensity. No intracranial mass effect. Patchy white matter hypodensity is stable. No evidence of cortically based acute infarction identified. No acute intracranial hemorrhage identified.  CT CERVICAL SPINE FINDINGS  Chronic bulky flowing anterior endplate osteophytes in the  cervical spine. Stable straightening of cervical lordosis. *INSERT* skullbase partially calcified ligamentous hypertrophy re- identified about the odontoid. Cervicothoracic junction alignment is within normal limits. Bilateral posterior element alignment is within normal limits. Multilevel facet hypertrophy. No acute cervical spine fracture identified.  Stable lung apices. Grossly stable and intact visualized upper thoracic levels. Widespread arterial calcified atherosclerosis. Partially retropharyngeal course of the right common carotid artery. Otherwise negative paraspinal soft tissues.  IMPRESSION: 1. Left scalp hematoma without underlying fracture.  2. No acute intracranial abnormality.  3. No acute fracture or listhesis identified in the cervical spine. Ligamentous injury is not excluded. Diffuse idiopathic skeletal hyperostosis .   Electronically Signed   By: Augusto Gamble M.D.   On: 09/11/2013 15:30   Ct Cervical Spine Wo Contrast  09/11/2013   CLINICAL DATA:  79 year old male status post fall. Head injury. Severe headache. Initial  encounter.  EXAM: CT HEAD WITHOUT CONTRAST  CT CERVICAL SPINE WITHOUT CONTRAST  TECHNIQUE: Multidetector CT imaging of the head and cervical spine was performed following the standard protocol without intravenous contrast. Multiplanar CT image reconstructions of the cervical spine were also generated.  COMPARISON:  10/01/2012.  FINDINGS: CT HEAD FINDINGS  Stable paranasal sinuses and mastoids. Left superior forehead scalp hematoma measuring up to 10 mm in thickness. Underlying left frontal bone intact. Calvarium stable and intact. Stable orbits soft tissues, postoperative changes on the right.  Calcified atherosclerosis at the skull base. Stable dural calcifications. Stable cerebral volume. No ventriculomegaly. Tortuous intracranial artery is re- identified. No suspicious intracranial vascular hyperdensity. No intracranial mass effect. Patchy white matter hypodensity is stable. No  evidence of cortically based acute infarction identified. No acute intracranial hemorrhage identified.  CT CERVICAL SPINE FINDINGS  Chronic bulky flowing anterior endplate osteophytes in the cervical spine. Stable straightening of cervical lordosis. *INSERT* skullbase partially calcified ligamentous hypertrophy re- identified about the odontoid. Cervicothoracic junction alignment is within normal limits. Bilateral posterior element alignment is within normal limits. Multilevel facet hypertrophy. No acute cervical spine fracture identified.  Stable lung apices. Grossly stable and intact visualized upper thoracic levels. Widespread arterial calcified atherosclerosis. Partially retropharyngeal course of the right common carotid artery. Otherwise negative paraspinal soft tissues.  IMPRESSION: 1. Left scalp hematoma without underlying fracture.  2. No acute intracranial abnormality.  3. No acute fracture or listhesis identified in the cervical spine. Ligamentous injury is not excluded. Diffuse idiopathic skeletal hyperostosis .   Electronically Signed   By: Augusto Gamble M.D.   On: 09/11/2013 15:30   Dg Shoulder Left  09/11/2013   CLINICAL DATA:  Fall.  EXAM: LEFT SHOULDER - 2+ VIEW  COMPARISON:  Chest x-ray 09/11/2013.  FINDINGS: Degenerative changes left shoulder involving the glenohumeral joint and acromioclavicular joint. Left shoulder is high-riding suggesting chronic rotator cuff tear. No acute abnormality identified. No evidence of fracture, dislocation, or separation. Tiny metallic foreign body noted in the anterior aspect of the left upper arm soft tissues. This is most likely old, no evidence of adjacent acute soft tissue injury.  IMPRESSION: 1. DJD, no acute abnormality. Probable chronic left rotator cuff tear.  2.  No evidence of fracture, dislocation, or separation.   Electronically Signed   By: Maisie Fus  Register   On: 09/11/2013 14:44   Medications: I have reviewed the patient's current  medications. Scheduled Meds: . [START ON 09/13/2013] amiodarone  100 mg Oral Daily  . amLODipine  5 mg Oral Daily  . aspirin EC  81 mg Oral Daily  . carvedilol  6.25 mg Oral BID WC  . docusate sodium  100 mg Oral BID  . ferrous sulfate  325 mg Oral Q breakfast  . furosemide  40 mg Oral q morning - 10a  . heparin  5,000 Units Subcutaneous Q8H  . levothyroxine  25 mcg Oral QAC breakfast  . losartan  50 mg Oral Daily  . loteprednol  1 drop Right Eye q morning - 10a  . oxybutynin  5 mg Oral Daily  . pantoprazole  40 mg Oral Daily  . simvastatin  20 mg Oral q1800  . sodium chloride  3 mL Intravenous Q12H  . spironolactone  12.5 mg Oral QODAY  . terazosin  1 mg Oral QHS   Continuous Infusions:  PRN Meds:.acetaminophen, acetaminophen, albuterol, benzonatate Assessment/Plan: Principal Problem:   Multiple falls Active Problems:   HYPERLIPIDEMIA   Chronic systolic heart failure   Bradycardia  CAD (coronary artery disease)   Shortness of breath   Abnormality of gait   Ulcer of lower limb, unspecified- Bilateral foot  # Falls in the setting of chronic bradycardia  The patient has had multiple falls. I believe that these are due to deconditioning and worsening foot pain. I believe that the patient requires inpatient rehab before he will be safe for discharge home. It is unlikely but possible that this is due to symptomatic bradycardia. However, this bradycardia appears chronic and unchanged over the last few years  - PT OT Eval and treat  - Wound Care left foot wound  - Admit to tele and monitor HR and signs of symptomatic bradycardia  - Consult SW for placement in SNF  - Falls precautions and  PT OT eval  - Tylenol prn pain  - F/U B12  # SOB  The patient has chronic SOB at baseline and mild wheezing on physical exam.CXR demonstrated a opacity of the left lower lobe (mild) that has been visualized before. There is concern for PNA. Patient received levofloxacin in the ED. Will  consider continuing abx in AM.  - Albuterol  -Tessalon   # CHF (last EF 65% with severe AS, previous 40-45%) in setting of CAS HTN and HLD  - Continue home statin  - losartan, spironolactone, amlodipine, furosemide, ASA 81 mg   #PAD  Balloon angioplasty of porximal left anterior tibial artery in June 2014. No signs of limb ischemia during this admission.  # Paroxysmal afib  - decreased amiodarone from 200 mg qd to 100 mg qd after discussing case with Dr. Elease Hashimoto. - Not anticoagulated at this time due to multiple recurrent diverticular GI bleeds   #OSA  Continue home CPAP.   Dispo: Disposition is deferred at this time, awaiting improvement of current medical problems.  Anticipated discharge in approximately 1-2 day(s).   The patient does have a current PCP Jonah Blue, DO) and does need an [facility] hospital follow-up appointment after discharge.  The patient does not have transportation limitations that hinder transportation to clinic appointments.  .Services Needed at time of discharge: Y = Yes, Blank = No PT:   OT:   RN:   Equipment:   Other:     LOS: 1 day   Pleas Koch, MD 09/12/2013, 10:51 AM

## 2013-09-13 NOTE — Consult Note (Signed)
WOC wound consult note Reason for Consult:Ulceration on foot-chronic.  Patient is followed by Dr. Grandville Silos at the outpatient wound care center.  He has had this wound for many months, but does not know for how long. Patient states that he "usually has a bandage on it" but does not know what type. Wound type:Neuropathic Pressure Ulcer POA: No Measurement:1cm round, unable to determine depth as there is a soft yellow center. Wound bed:as described above Drainage (amount, consistency, odor) scant amount of serous exudate. Periwound:intact.  There is a small callus surrounding ulcer. Dressing procedure/placement/frequency:I will begin a twice daily saline dressing in an attempt to keep the skin softened so that upon return to the outpatient [facility], Dr. Jearld Pies may debride the callus. WOC nursing team will not follow, but will remain available to this patient, the nursing and medical team.  Please re-consult if needed. Thanks, Ladona Mow, MSN, RN, GNP, [place], CWON-AP [phone])

## 2013-09-13 NOTE — Significant Event (Signed)
Confused  Pt found sitting on floor states is" feeding his cats " no indication of a fall . No signs of injury noted. [facility] internal med MD notified  . POA notified  . Charge nurse notified.  Assisted  pt back up into bed bed, alarm on,rails up x3. Will continue to monitor patient .

## 2013-09-13 NOTE — Progress Notes (Signed)
Subjective: No overnight events. Awaiting placement.   Objective: Vital signs in last 24 hours: Filed Vitals:   09/12/13 1945 09/13/13 0434 09/13/13 1406 09/13/13 1740  BP: 124/54 119/64 97/53 118/59  Pulse: 50 59 62 57  Temp: 98.1 F (36.7 C) 97.9 F (36.6 C) 98 F (36.7 C) 97.6 F (36.4 C)  TempSrc: Oral Oral Axillary Oral  Resp: 18 20 18 18   Height:      Weight:  203 lb 7.8 oz (92.3 kg)    SpO2: 94% 95% 95% 97%   Weight change: -1 lb 8.7 oz (-0.7 kg)  Intake/Output Summary (Last 24 hours) at 09/13/13 1911 Last data filed at 09/13/13 1852  Gross per 24 hour  Intake    900 ml  Output    495 ml  Net    405 ml   Vitals reviewed. General: resting in bed, NAD HEENT: no scleral icterus Cardiac: RRR, no rubs, murmurs or gallops Pulm: clear to auscultation bilaterally, no wheezes, rales, or rhonchi Abd: soft, nontender, nondistended, BS present Ext: warm and well perfused, trace pedal edema bilaterally Neuro: alert and oriented X3, moves all extremities voluntarily    Lab Results: Basic Metabolic Panel:  Recent Labs Lab 09/11/13 1258 09/12/13 0414  NA 138 139  K 4.4 4.0  CL 102 104  CO2 27 27  GLUCOSE 92 84  BUN 31* 27*  CREATININE 1.22 1.17  CALCIUM 9.2 9.0   Liver Function Tests:  Recent Labs Lab 09/11/13 1258  AST 15  ALT 14  ALKPHOS 80  BILITOT 0.5  PROT 6.3  ALBUMIN 3.5   CBC:  Recent Labs Lab 09/11/13 1258 09/12/13 0414  WBC 6.9 6.6  NEUTROABS 4.8  --   HGB 13.9 13.1  HCT 41.6 40.0  MCV 94.3 95.7  PLT 172 169   BNP:  Recent Labs Lab 09/11/13 1316  PROBNP 1443.0*   Anemia Panel:  Recent Labs Lab 09/12/13 1356  VITAMINB12 875   Urine Drug Screen: Drugs of Abuse     Component Value Date/Time   LABOPIA NONE DETECTED 11/23/2010 0646   COCAINSCRNUR NONE DETECTED 11/23/2010 0646   LABBENZ NONE DETECTED 11/23/2010 0646   AMPHETMU NONE DETECTED 11/23/2010 0646   THCU NONE DETECTED 11/23/2010 0646   LABBARB  Value: NONE  DETECTED        DRUG SCREEN FOR MEDICAL PURPOSES ONLY.  IF CONFIRMATION IS NEEDED FOR ANY PURPOSE, NOTIFY LAB WITHIN 5 DAYS.        LOWEST DETECTABLE LIMITS FOR URINE DRUG SCREEN Drug Class       Cutoff (ng/mL) Amphetamine      1000 Barbiturate      200 Benzodiazepine   200 Tricyclics       300 Opiates          300 Cocaine          300 THC              50 11/23/2010 0646    Urinalysis:  Recent Labs Lab 09/11/13 1420  COLORURINE YELLOW  LABSPEC 1.011  PHURINE 5.0  GLUCOSEU NEGATIVE  HGBUR NEGATIVE  BILIRUBINUR NEGATIVE  KETONESUR NEGATIVE  PROTEINUR NEGATIVE  UROBILINOGEN 0.2  NITRITE NEGATIVE  LEUKOCYTESUR NEGATIVE   Medications: I have reviewed the patient's current medications. Scheduled Meds: . amiodarone  100 mg Oral Daily  . amLODipine  5 mg Oral Daily  . aspirin EC  81 mg Oral Daily  . carvedilol  6.25 mg Oral BID WC  .  docusate sodium  100 mg Oral BID  . ferrous sulfate  325 mg Oral Q breakfast  . furosemide  40 mg Oral q morning - 10a  . heparin  5,000 Units Subcutaneous Q8H  . levothyroxine  25 mcg Oral QAC breakfast  . losartan  50 mg Oral Daily  . loteprednol  1 drop Right Eye q morning - 10a  . oxybutynin  5 mg Oral Daily  . pantoprazole  40 mg Oral Daily  . simvastatin  20 mg Oral q1800  . sodium chloride  3 mL Intravenous Q12H  . spironolactone  12.5 mg Oral QODAY  . terazosin  1 mg Oral QHS   Continuous Infusions:  PRN Meds:.acetaminophen, acetaminophen, albuterol, benzonatate Assessment/Plan: # Falls in the setting of chronic bradycardia  The patient has had multiple falls likely due to deconditioning and worsening foot pain and will require inpatient rehab before he will be safe for discharge home. It is unlikely but possible that this is due to symptomatic bradycardia. However, this bradycardia appears chronic and unchanged over the last few years  - Wound Care left foot wound  - Continue telemetry for HR and signs of symptomatic bradycardia  - Consult  SW for placement in SNF  - Falls precautions and PT OT eval  - Tylenol prn pain  - F/U B12   # SOB  The patient has chronic SOB at baseline with no wheezing today.  CXR demonstrated a opacity of the left lower lobe (mild) that has been visualized before. There is concern for PNA. Patient received levofloxacin in the ED but this was discontinued with no clinical signs of PNA. Will consider continuing abx in AM.  - Albuterol  -Tessalon   # CHF (last EF 65% with severe AS, previous 40-45%) in setting of CAS HTN and HLD  - Continue home statin  - losartan, spironolactone, amlodipine, furosemide, ASA 81 mg   #PAD  Balloon angioplasty of porximal left anterior tibial artery in June 2014. No signs of limb ischemia during this admission.   # Paroxysmal afib  - decreased amiodarone from 200 mg qd to 100 mg qd after discussing case with Dr. Elease Hashimoto.  - Not anticoagulated at this time due to multiple recurrent diverticular GI bleeds   #OSA  Continue home CPAP.   Dispo: Disposition is deferred at this time, awaiting improvement of current medical problems. Anticipated discharge in approximately 1-2 day(s).  .   The patient does have a current PCP Jonah Blue, DO) and does need an [facility] hospital follow-up appointment after discharge.  The patient does not have transportation limitations that hinder transportation to clinic appointments.  .Services Needed at time of discharge: Y = Yes, Blank = No PT:   OT:   RN:   Equipment:   Other:     LOS: 2 days   Ky Barban, MD 09/13/2013, 7:11 PM

## 2013-09-13 NOTE — Clinical Social Work Placement (Signed)
Clinical Social Work Department CLINICAL SOCIAL WORK PLACEMENT NOTE 09/13/2013  Patient:  Eugene [name], Eugene [name]  Account Number:  [id number] Admit date:  09/11/2013  Clinical Social Worker:  Jacelyn Grip  Date/time:  09/12/2013 02:55 PM  Clinical Social Work is seeking post-discharge placement for this patient at the following level of care:   SKILLED NURSING   (*CSW will update this form in Epic as items are completed)   09/12/2013  Patient/family provided with [facility] Department of Clinical Social Work's list of facilities offering this level of care within the geographic area requested by the patient (or if unable, by the patient's family).  09/12/2013  Patient/family informed of their freedom to choose among providers that offer the needed level of care, that participate in Medicare, Medicaid or managed care program needed by the patient, have an available bed and are willing to accept the patient.  09/12/2013  Patient/family informed of [facility]' ownership interest in [facility], as well as of the fact that they are under no obligation to receive care at this facility.  PASARR submitted to EDS on 09/12/2013 PASARR number received from EDS on 09/12/2013  FL2 transmitted to all facilities in geographic area requested by pt/family on  09/12/2013 FL2 transmitted to all facilities within larger geographic area on   Patient informed that his/her managed care company has contracts with or will negotiate with  certain facilities, including the following:     Patient/family informed of bed offers received:  09/13/2013 Patient chooses bed at [facility] AND [facility] Physician recommends and patient chooses bed at    Patient to be transferred to  on   Patient to be transferred to facility by   The following physician request were entered in Epic:   Additional Comments:   Roddie Mc, Bryon Lions, [phone]

## 2013-09-13 NOTE — Progress Notes (Signed)
Utilization Review Completed.Ashleyann Shoun T12/04/2013  

## 2013-09-14 DIAGNOSIS — I498 Other specified cardiac arrhythmias: Secondary | ICD-10-CM

## 2013-09-14 DIAGNOSIS — Z9181 History of falling: Secondary | ICD-10-CM

## 2013-09-14 DIAGNOSIS — I4891 Unspecified atrial fibrillation: Secondary | ICD-10-CM

## 2013-09-14 DIAGNOSIS — I509 Heart failure, unspecified: Secondary | ICD-10-CM

## 2013-09-14 MED ORDER — FUROSEMIDE 20 MG PO TABS
20.0000 mg | ORAL_TABLET | Freq: Every morning | ORAL | Status: DC
  Administered 2013-09-15: 11:00:00 20 mg via ORAL
  Filled 2013-09-14: qty 1

## 2013-09-14 NOTE — Progress Notes (Signed)
Pt's HCPOA had concerns about pt's mental status which has been "deteriorating over the past 3 months." He wanted to suggest the possibility of a psych eval before sending pt to SNF. Notified MD on call of POA's concerns. MD will follow up tomorrow. Will continue to monitor pt closely.  Juliane Lack, RN

## 2013-09-14 NOTE — Progress Notes (Signed)
Physical Therapy Treatment Patient Details Name: Eugene [name] MRN: [id number] DOB: 1924/02/24 Today's Date: 09/14/2013 Time: 4132-4401 PT Time Calculation (min): 18 min  PT Assessment / Plan / Recommendation  History of Present Illness Pt. with multiple falls at home.   PT Comments   Pt progressing with activity but limited memory of events or need for assist for safety and mobility. Pt educated for need for assist to prevent fall and encouraged to continue HEP.  Follow Up Recommendations  SNF;Supervision for mobility/OOB     Does the patient have the potential to tolerate intense rehabilitation     Barriers to Discharge        Equipment Recommendations       Recommendations for Other Services    Frequency     Progress towards PT Goals Progress towards PT goals: Progressing toward goals  Plan Current plan remains appropriate    Precautions / Restrictions Precautions Precautions: Fall Precaution Comments: recurrent falls; fell 3 times last week  Restrictions Weight Bearing Restrictions: No   Pertinent Vitals/Pain No pain BP 100/56 sitting HR 45-60 during session sats 97% on rA   Mobility  Bed Mobility Bed Mobility: Not assessed Details for Bed Mobility Assistance: pt in recliner on arrival Transfers Sit to Stand: 4: Min assist;From chair/3-in-1;With armrests Stand to Sit: 4: Min guard;To chair/3-in-1;With armrests Details for Transfer Assistance: cueing for hand placement and safety with cues to control descent to surface Ambulation/Gait Ambulation/Gait Assistance: 4: Min guard Ambulation Distance (Feet): 110 Feet Assistive device: Rolling walker Ambulation/Gait Assistance Details: cues for posture and position in RW with directional cues to room Gait Pattern: Step-through pattern;Decreased stride length;Wide base of support;Trunk flexed Gait velocity: decreased  Stairs: Yes Stairs Assistance: 4: Min guard Stair Management Technique: Step to pattern;Two  rails;Forwards Number of Stairs: 4    Exercises General Exercises - Lower Extremity Long Arc Quad: AROM;Seated;Both;15 reps Hip ABduction/ADduction: AROM;Seated;Both;20 reps Hip Flexion/Marching: AROM;Seated;Both;15 reps   PT Diagnosis:    PT Problem List:   PT Treatment Interventions:     PT Goals (current goals can now be found in the care plan section) Acute Rehab PT Goals Patient Stated Goal: to stop falling   Visit Information  Last PT Received On: 09/14/13 Assistance Needed: +1 History of Present Illness: Pt. with multiple falls at home.    Subjective Data  Patient Stated Goal: to stop falling    Cognition  Cognition Arousal/Alertness: Awake/alert Behavior During Therapy: WFL for tasks assessed/performed Overall Cognitive Status: Within Functional Limits for tasks assessed Memory: Decreased short-term memory (pt doesn't recall prior session or "feeding cats" earlier)    Balance     End of Session PT - End of Session Equipment Utilized During Treatment: Gait belt Activity Tolerance: Patient tolerated treatment well Patient left: in chair;with call bell/phone within reach;with chair alarm set   GP     Toney Sang Beth 09/14/2013, 2:12 PM Delaney Meigs, PT [phone]

## 2013-09-14 NOTE — Progress Notes (Signed)
    Date: 09/14/2013      Patient name: Eugene [name]  Medical record number: [id number]  Date of birth: 28-Sep-1924  Assessment: 79 year old man with multiple falls. Admitted to provide safe discharge and rehab plan. No fractures. No events in the hospital.   Exam today: The patient appears sleepy and is not interested in a conversation. He allows me to examine him, but dozes off. His recent vitals remarkable for new onset hypotension.  Filed Vitals:   09/13/13 2050 09/14/13 0443 09/14/13 0952 09/14/13 1000  BP:  123/66 89/50 84/56   Pulse: 62 61 59 59  Temp:  97.1 F (36.2 C)  97.6 F (36.4 C)  TempSrc:  Oral  Oral  Resp: 16 18    Height:      Weight:  199 lb 15.3 oz (90.7 kg)    SpO2:  94% 98%     He denies dizziness. His exam is otherwise unremarkable.   Plan  Relative hypotension - agree with stopping hypertension meds for now. We will get repeat measurement and take it from there, and so orthostatic vitals as needed. A small 250 ml bolus or NS or oral fluids if the BP does not come up. We will keep in mind that his CXR on admission showed mild pneumonia, although we did not think he had a compelling argument in favor of it at the time, and was kept off antibiotics.   Disposion - SNF, currently waiting.   Case reviewed with IM resident team; rest of the plan per resident note,   Reznor Ferrando, [facility] 09/14/2013, 12:12 PM.

## 2013-09-14 NOTE — Progress Notes (Signed)
Subjective: Became confused overnight. Thought he saw cats in his room and wanted to feed them. Awaiting SNF placement.   Objective: Vital signs in last 24 hours: Filed Vitals:   09/13/13 1740 09/13/13 2044 09/13/13 2050 09/14/13 0443  BP: 118/59 150/72  123/66  Pulse: 57 60 62 61  Temp: 97.6 F (36.4 C) 97.6 F (36.4 C)  97.1 F (36.2 C)  TempSrc: Oral Oral  Oral  Resp: 18 18 16 18   Height:      Weight:    199 lb 15.3 oz (90.7 kg)  SpO2: 97% 94%  94%   Weight change: -1 lb 1.6 oz (-0.5 kg)  Intake/Output Summary (Last 24 hours) at 09/14/13 0741 Last data filed at 09/14/13 0558  Gross per 24 hour  Intake    960 ml  Output    925 ml  Net     35 ml   Vitals reviewed. General: resting in bed, NAD HEENT: no scleral icterus Cardiac: RRR, no rubs, murmurs or gallops Pulm: clear to auscultation bilaterally, no wheezes, rales, or rhonchi Abd: soft, nontender, nondistended, BS present Ext: warm and well perfused, trace pedal edema bilaterally Neuro: alert and oriented X3, moves all extremities voluntarily    Lab Results: Basic Metabolic Panel:  Recent Labs Lab 09/11/13 1258 09/12/13 0414  NA 138 139  K 4.4 4.0  CL 102 104  CO2 27 27  GLUCOSE 92 84  BUN 31* 27*  CREATININE 1.22 1.17  CALCIUM 9.2 9.0   Liver Function Tests:  Recent Labs Lab 09/11/13 1258  AST 15  ALT 14  ALKPHOS 80  BILITOT 0.5  PROT 6.3  ALBUMIN 3.5   CBC:  Recent Labs Lab 09/11/13 1258 09/12/13 0414  WBC 6.9 6.6  NEUTROABS 4.8  --   HGB 13.9 13.1  HCT 41.6 40.0  MCV 94.3 95.7  PLT 172 169   BNP:  Recent Labs Lab 09/11/13 1316  PROBNP 1443.0*   Anemia Panel:  Recent Labs Lab 09/12/13 1356  VITAMINB12 875   Urine Drug Screen: Drugs of Abuse     Component Value Date/Time   LABOPIA NONE DETECTED 11/23/2010 0646   COCAINSCRNUR NONE DETECTED 11/23/2010 0646   LABBENZ NONE DETECTED 11/23/2010 0646   AMPHETMU NONE DETECTED 11/23/2010 0646   THCU NONE DETECTED  11/23/2010 0646   LABBARB  Value: NONE DETECTED        DRUG SCREEN FOR MEDICAL PURPOSES ONLY.  IF CONFIRMATION IS NEEDED FOR ANY PURPOSE, NOTIFY LAB WITHIN 5 DAYS.        LOWEST DETECTABLE LIMITS FOR URINE DRUG SCREEN Drug Class       Cutoff (ng/mL) Amphetamine      1000 Barbiturate      200 Benzodiazepine   200 Tricyclics       300 Opiates          300 Cocaine          300 THC              50 11/23/2010 0646    Urinalysis:  Recent Labs Lab 09/11/13 1420  COLORURINE YELLOW  LABSPEC 1.011  PHURINE 5.0  GLUCOSEU NEGATIVE  HGBUR NEGATIVE  BILIRUBINUR NEGATIVE  KETONESUR NEGATIVE  PROTEINUR NEGATIVE  UROBILINOGEN 0.2  NITRITE NEGATIVE  LEUKOCYTESUR NEGATIVE   Medications: I have reviewed the patient's current medications. Scheduled Meds: . amiodarone  100 mg Oral Daily  . amLODipine  5 mg Oral Daily  . aspirin EC  81 mg Oral Daily  .  carvedilol  6.25 mg Oral BID WC  . docusate sodium  100 mg Oral BID  . ferrous sulfate  325 mg Oral Q breakfast  . furosemide  40 mg Oral q morning - 10a  . heparin  5,000 Units Subcutaneous Q8H  . levothyroxine  25 mcg Oral QAC breakfast  . losartan  50 mg Oral Daily  . loteprednol  1 drop Right Eye q morning - 10a  . oxybutynin  5 mg Oral Daily  . pantoprazole  40 mg Oral Daily  . simvastatin  20 mg Oral q1800  . sodium chloride  3 mL Intravenous Q12H  . spironolactone  12.5 mg Oral QODAY  . terazosin  1 mg Oral QHS   Continuous Infusions:  PRN Meds:.acetaminophen, acetaminophen, albuterol, benzonatate Assessment/Plan: # Falls in the setting of chronic bradycardia  The patient has had multiple falls likely due to deconditioning and worsening foot pain and will require inpatient rehab before he will be safe for discharge home. It is unlikely but possible that this is due to symptomatic bradycardia. However, this bradycardia appears chronic and unchanged over the last few years  - Wound Care left foot wound  - Continue telemetry for HR and signs  of symptomatic bradycardia  - Consult SW for placement in SNF  - Falls precautions and PT OT eval  - Tylenol prn pain  - F/U B12 was WNL  # SOB  The patient has chronic SOB at baseline with no wheezing today.  CXR demonstrated a opacity of the left lower lobe (mild) that has been visualized before. There is concern for PNA. Patient received levofloxacin in the ED but this was discontinued with no clinical signs of PNA. Will consider continuing abx in AM.  - Albuterol  -Tessalon   # CHF (last EF 65% with severe AS, previous 40-45%) in setting of CAS HTN and HLD  - Continue home statin  - losartan, spironolactone, amlodipine, furosemide, coreg, ASA 81 mg   # HTN Low BP 80's/50's this AM. - Place hold parameters on meds - losartan, spironolactone, amlodipine, furosemide, coreg  #PAD  Balloon angioplasty of porximal left anterior tibial artery in June 2014. No signs of limb ischemia during this admission.   # Paroxysmal afib  - decreased amiodarone from 200 mg qd to 100 mg qd after discussing case with Dr. Elease Hashimoto.  - Continue coreg 6.25 mg with old parameters - Not anticoagulated at this time due to multiple recurrent diverticular GI bleeds   #OSA  Continue home CPAP.   Dispo: Disposition is deferred at this time, awaiting improvement of current medical problems. Anticipated discharge in approximately 1-2 day(s).  .   The patient does have a current PCP Jonah Blue, DO) and does need an [facility] hospital follow-up appointment after discharge.  The patient does not have transportation limitations that hinder transportation to clinic appointments.  .Services Needed at time of discharge: Y = Yes, Blank = No PT:   OT:   RN:   Equipment:   Other:     LOS: 3 days   Pleas Koch, MD 09/14/2013, 7:41 AM

## 2013-09-14 NOTE — Evaluation (Signed)
Occupational Therapy Evaluation Patient Details Name: Eugene [name] MRN: [id number] DOB: 08-27-1924 Today's Date: 09/14/2013 Time: 4098-1191 OT Time Calculation (min): 33 min  OT Assessment / Plan / Recommendation History of present illness Pt. with multiple falls at home.   Clinical Impression   Pt. Is pleasant male with a recent hx. Of falls. Pt. Has generalized weakness and would benefit from further OT at SNF to increase safety for d/c home. Pt. Is an agreement with plan and no further OT needed in acute care.     OT Assessment  All further OT needs can be met in the next venue of care    Follow Up Recommendations  SNF    Barriers to Discharge      Equipment Recommendations       Recommendations for Other Services    Frequency       Precautions / Restrictions Precautions Precautions: Fall Precaution Comments: recurrent falls; fell 3 times last week  Restrictions Weight Bearing Restrictions: No   Pertinent Vitals/Pain     ADL  Eating/Feeding: Independent Where Assessed - Eating/Feeding: Chair Grooming: Simulated;Brushing hair;Set up Where Assessed - Grooming: Unsupported sitting Upper Body Bathing: Simulated;Set up Where Assessed - Upper Body Bathing: Unsupported sitting Lower Body Bathing: Simulated;Min guard Where Assessed - Lower Body Bathing: Unsupported sitting;Supported sit to stand Upper Body Dressing: Performed;Minimal assistance Where Assessed - Upper Body Dressing: Unsupported sitting Lower Body Dressing: Minimal assistance;Performed Where Assessed - Lower Body Dressing: Unsupported sitting;Supported standing Toilet Transfer: Simulated;Min guard;Minimal assistance Toilet Transfer Method: Stand pivot ADL Comments: Pt. has decreased SHLD strength and ROM and has difficulty with reaching back of head to comb hair. Pt. unable to tie and untie gown sescondary to decreased SHLD ROM.     OT Diagnosis:    OT Problem List:   OT Treatment Interventions:      OT Goals(Current goals can be found in the care plan section) Acute Rehab OT Goals Patient Stated Goal: to stop falling   Visit Information  Last OT Received On: 09/14/13 Assistance Needed: +1 History of Present Illness: Pt. with multiple falls at home.       Prior Functioning     Home Living Family/patient expects to be discharged to:: Skilled nursing facility Living Arrangements: Alone Available Help at Discharge: Friend(s);Available PRN/intermittently Type of Home: House Home Access: Stairs to enter Entrance Stairs-Number of Steps: 3 Entrance Stairs-Rails: Can reach both Additional Comments: pt agreeable to SNF prior to returning home  Prior Function Level of Independence: Independent with assistive device(s) Comments: Pt reports frequent falls with 2 in past 2 weeks. Pt reports "I just call the EMTs and they come pick me up and here we go again."  Communication Communication: HOH Dominant Hand: Right         Vision/Perception Vision - History Baseline Vision: Wears glasses all the time   Cognition  Cognition Arousal/Alertness: Awake/alert Behavior During Therapy: WFL for tasks assessed/performed Overall Cognitive Status: Within Functional Limits for tasks assessed    Extremity/Trunk Assessment Upper Extremity Assessment Upper Extremity Assessment: Generalized weakness     Mobility       Exercise     Balance     End of Session OT - End of Session Activity Tolerance: Patient tolerated treatment well Patient left: in chair;with call bell/phone within reach;with chair alarm set;with family/visitor present  GO     Eugene [name] 09/14/2013, 2:07 PM

## 2013-09-14 NOTE — Progress Notes (Signed)
RT placed patient on cpap auto 20cmH20 max and 5cmH20 min via full face mask. RT will continue to monitor.

## 2013-09-14 NOTE — Progress Notes (Signed)
Pt with bp of 89/50 in left arm and 84/56 in right arm. Pt sitting in chair and denied any dizziness or weakness. Pt stated "I feel fine." MD made aware of BP. Held Norvasc, Lasix, Cozaar, and spironolactone per MD. Encouraged PO intake. Will continue to monitor pt closely.  Juliane Lack, RN

## 2013-09-14 NOTE — Discharge Summary (Signed)
Physician Discharge Summary  Patient ID: Eugene [name] MRN: [id number] DOB/AGE: 1923/11/19 79 y.o.  Admit date: 09/11/2013 Discharge date: 09/14/2013  Admission Diagnoses: Multiple falls   Discharge Diagnoses: Multiple falls  Hospital Course: Eugene [name] is a 79 year old man who was admitted on 09/11/13 for multiple recent falls, three occuring within the past week, all thought to be due to mechanical factors (tripping on a slick floor, tripping over stairs and tripping over his other foot).  He called EMS since he was unable to get himself up and was transported to the ED by ambulance since EMS noted he was bradycardic to 44.  At the time of admission, he denied SOB, CP, lightheadedness, dizziness, LOC, weakness, incontinence of bowel or bladder.  His physical exam was significant for bradycardia to 44, pulmonary exam was significant for wheezes and his extremities were significant only for bilateral lower extremity edema and a small wound on the dorsal plantar aspect of his left foot, draining serosanguinous fluid.  CXR performed in the ED demonstrated LLL opacity, which is unchanged from prior imaging studies.  A hospital course, arranged by problem, follows:  Falls in the setting of bradycardia: This was evaluated to likely be secondary to deconditioning and worsening foot pain, rather than bradycardia.  PT and OT were consulted to work with him while inpatient.  He was started on acetaminophen 650 mg every six hours as needed for his pain; he was discharged on acetaminophen 650 mg every six hours as needed for pain.  SOB: this is a chronic problem managed in the outpatient setting with albuterol and Tessalon Perles.  Due to the unchanged nature of his CXR and the fact that his symptoms are not worsening, this is thought to be unrelated to his recent falls.  Outpatient management with benzonate (Tesssalon Perles) 100 mg as needed up to three times daily and was given Duonebs with albuterol.  He was  discharged on benzonate (Tesssalon Perles) 100 mg as needed every eight hours.  CHF in the setting of HTN and HLD: This is a chronic problem managed in the outpatient setting with pravastatin 40 mg daily, losartan 50 mg daily, spironolactone 12.5 mg every other day, amlodipine 200 mg daily, furosemide 40 mg daily and ASA 81 mg.  His home medications were continued, except for substituting simvastatin for pravastatin.  He was discharged on his prior regimen.    Paroxysmal atrial fibrillation: This is a chronic problem managed in the outpatient setting with amiodarone.  He is not anticoagulated due to his recent history of diverticular bleeds.  His outpatient regimen was started, though his amiodarone was decreased from 200 mg qd to 100 mg qd; he discharged on amiodarone 100 qd.  At the time of discharge, patient is eating and drinking without problems. He is moving his bowels and bladder without problems and is able to ambulate.   Discharge Exam: Blood pressure 84/56, pulse 59, temperature 97.6 F (36.4 C), temperature source Oral, resp. rate 18, height 4\' 9"  (1.448 m), weight 90.7 kg (199 lb 15.3 oz), SpO2 98.00%. General appearance: alert and no distress Head: Normocephalic, without obvious abnormality, atraumatic Resp: clear to auscultation bilaterally Cardio: regular rate and rhythm, S1, S2 normal, no murmur, click, rub or gallop    Disposition: 03-Skilled Nursing Facility   Future Appointments Provider Department Dept Phone   09/15/2013 2:30 PM Imp-Imcr Nurse [facility] [phone]   09/24/2013 8:15 AM Jonah Blue, DO [facility] [phone]  08/18/2014 1:00 PM [facility] [address] [phone]   08/18/2014 1:30 PM Chuck Hint, MD [facility] [phone]       Medication List    ASK your doctor about these medications       amiodarone 200 MG tablet   Commonly known as:  PACERONE  Take 200 mg by mouth daily.     amLODipine 5 MG tablet  Commonly known as:  NORVASC  Take 5 mg by mouth daily.     aspirin EC 81 MG tablet  Take 81 mg by mouth daily.     benzonatate 100 MG capsule  Commonly known as:  TESSALON PERLES  Take 1 capsule (100 mg total) by mouth 3 (three) times daily as needed for cough.     CALCIUM + D PO  Take 1 tablet by mouth daily.     carvedilol 6.25 MG tablet  Commonly known as:  COREG  Take 6.25 mg by mouth 2 (two) times daily with a meal.     ciprofloxacin 250 MG tablet  Commonly known as:  CIPRO  Take 250 mg by mouth 2 (two) times daily. For 5 days     docusate sodium 100 MG capsule  Commonly known as:  COLACE  Take 100 mg by mouth 2 (two) times daily.     ferrous sulfate 325 (65 FE) MG tablet  Take 325 mg by mouth daily with breakfast.     furosemide 40 MG tablet  Commonly known as:  LASIX  Take 40 mg by mouth every morning.     levothyroxine 25 MCG tablet  Commonly known as:  SYNTHROID, LEVOTHROID  Take 25 mcg by mouth daily before breakfast.     losartan 50 MG tablet  Commonly known as:  COZAAR  Take 50 mg by mouth daily.     loteprednol 0.5 % ophthalmic suspension  Commonly known as:  LOTEMAX  Place 1 drop into the right eye every morning.     oxybutynin 5 MG 24 hr tablet  Commonly known as:  DITROPAN-XL  Take 1 tablet (5 mg total) by mouth daily.     pantoprazole 40 MG tablet  Commonly known as:  PROTONIX  Take 1 tablet (40 mg total) by mouth daily.     pravastatin 40 MG tablet  Commonly known as:  PRAVACHOL  Take 40 mg by mouth daily.     spironolactone 25 MG tablet  Commonly known as:  ALDACTONE  Take 0.5 tablets (12.5 mg total) by mouth every other day.     terazosin 1 MG capsule  Commonly known as:  HYTRIN  Take 1 capsule (1 mg total) by mouth at bedtime.     VITAMIN B-12 IJ  Inject 1,000 mcg as directed every 7 (seven) days. Every tuesday     zoster vaccine live (PF)  19400 UNT/0.65ML injection  Commonly known as:  ZOSTAVAX  Inject 19,400 Units into the skin once.         Signed: Lang Snow 09/14/2013, 10:33 AM

## 2013-09-15 ENCOUNTER — Ambulatory Visit: Payer: Medicare Other

## 2013-09-15 ENCOUNTER — Other Ambulatory Visit: Payer: Self-pay | Admitting: Internal Medicine

## 2013-09-15 DIAGNOSIS — F0391 Unspecified dementia with behavioral disturbance: Secondary | ICD-10-CM

## 2013-09-15 DIAGNOSIS — F03918 Unspecified dementia, unspecified severity, with other behavioral disturbance: Secondary | ICD-10-CM

## 2013-09-15 MED ORDER — AMIODARONE HCL 100 MG PO TABS: 100.0000 mg | ORAL_TABLET | Freq: Every day | ORAL | Status: DC

## 2013-09-15 MED ORDER — CARVEDILOL 3.125 MG PO TABS: 3.1250 mg | ORAL_TABLET | Freq: Two times a day (BID) | ORAL | Status: DC

## 2013-09-15 MED ORDER — ACETAMINOPHEN 325 MG PO TABS: 650.0000 mg | ORAL_TABLET | Freq: Four times a day (QID) | ORAL | Status: DC | PRN

## 2013-09-15 MED ORDER — FUROSEMIDE 20 MG PO TABS: 20.0000 mg | ORAL_TABLET | Freq: Every morning | ORAL | Status: DC

## 2013-09-15 NOTE — Discharge Summary (Addendum)
   Date: 09/15/2013    Patient name: Eugene [name]  MRN: [id number]  Date of birth: 1923-11-05  I evaluated the patient on the day of discharge and discussed the discharge plan with my resident team. I agree with the discharge documentation and disposition.   -- Given the recent Bradycardia in 30-40s, we will cut corge to 3.125 mg BID and schedule follow up as outpatient with cardiology as early as possible. The patient is being discharged to SNF so he will be in a monitored environement.      Aletta Edouard 09/15/2013, 3:39 PM

## 2013-09-15 NOTE — Progress Notes (Signed)
1600 Report given to Atoinette <RN . [facility] . Transferrred to [facility]  [facility]AD Pt fullt awake atert and oriented. Moving all  Extremities. Marland Kitchen

## 2013-09-15 NOTE — Clinical Documentation Improvement (Signed)
THIS DOCUMENT IS NOT A PERMANENT PART OF THE MEDICAL RECORD  Please update your documentation with the medical record to reflect your response to this query. If you need help knowing how to do this please call [phone].  09/15/13  Dear Dr. Dalphine Handing Marton Redwood  In an effort to better capture your patient's severity of illness, reflect appropriate length of stay and utilization of resources, a review of the patient medical record has revealed the following indicators.    Based on your clinical judgment, please clarify and document in a progress note and/or discharge summary the clinical condition associated with the following supporting information:  In responding to this query please exercise your independent judgment.  The fact that a query is asked, does not imply that any particular answer is desired or expected.  Possible Clinical conditions:  Morbid Obesity W/ BMI= 43.83 kg/(m^2).  Other condition  Cannot Clinically determine   Nutrition Note:Body mass index is 43.83 kg/(m^2). Patient meets criteria for morbid obesity based on current BMI.  Reviewed: additional documentation in the medical record  Thank You,  Nevin Bloodgood, RN, BSN, CCDS  Clinical Documentation Specialist: [phone] [phone] Health Information Management     I have included Morbid Obesity in the discharge problem list.  Thanks   CK, MD

## 2013-09-15 NOTE — Progress Notes (Signed)
Subjective:  Patient is A+Ox3 this morning. No complaints of visual hallucinations. Mental status seemed to improve with better PO intake. Guardian request cognitive assessment.  Objective: Vital signs in last 24 hours: Filed Vitals:   09/14/13 1409 09/14/13 2039 09/14/13 2055 09/15/13 0532  BP: 100/56 107/52  125/49  Pulse: 60 61 50 62  Temp: 97.4 F (36.3 C) 97.2 F (36.2 C)  97.5 F (36.4 C)  TempSrc: Oral Oral  Oral  Resp: 18 18  19   Height:      Weight:    202 lb 2.6 oz (91.7 kg)  SpO2: 97% 97% 94% 93%   Weight change: 2 lb 3.3 oz (1 kg)  Intake/Output Summary (Last 24 hours) at 09/15/13 0839 Last data filed at 09/15/13 0600  Gross per 24 hour  Intake   1083 ml  Output    501 ml  Net    582 ml   Vitals reviewed. General: resting in bed, NAD HEENT: no scleral icterus Cardiac: RRR, no rubs, murmurs or gallops Pulm: clear to auscultation bilaterally, no wheezes, rales, or rhonchi Abd: soft, nontender, nondistended, BS present Ext: warm and well perfused, trace pedal edema bilaterally Neuro: alert and oriented X3, moves all extremities voluntarily    Lab Results: Basic Metabolic Panel:  Recent Labs Lab 09/11/13 1258 09/12/13 0414  NA 138 139  K 4.4 4.0  CL 102 104  CO2 27 27  GLUCOSE 92 84  BUN 31* 27*  CREATININE 1.22 1.17  CALCIUM 9.2 9.0   Liver Function Tests:  Recent Labs Lab 09/11/13 1258  AST 15  ALT 14  ALKPHOS 80  BILITOT 0.5  PROT 6.3  ALBUMIN 3.5   CBC:  Recent Labs Lab 09/11/13 1258 09/12/13 0414  WBC 6.9 6.6  NEUTROABS 4.8  --   HGB 13.9 13.1  HCT 41.6 40.0  MCV 94.3 95.7  PLT 172 169   BNP:  Recent Labs Lab 09/11/13 1316  PROBNP 1443.0*   Anemia Panel:  Recent Labs Lab 09/12/13 1356  VITAMINB12 875   Urine Drug Screen: Drugs of Abuse     Component Value Date/Time   LABOPIA NONE DETECTED 11/23/2010 0646   COCAINSCRNUR NONE DETECTED 11/23/2010 0646   LABBENZ NONE DETECTED 11/23/2010 0646   AMPHETMU NONE  DETECTED 11/23/2010 0646   THCU NONE DETECTED 11/23/2010 0646   LABBARB  Value: NONE DETECTED        DRUG SCREEN FOR MEDICAL PURPOSES ONLY.  IF CONFIRMATION IS NEEDED FOR ANY PURPOSE, NOTIFY LAB WITHIN 5 DAYS.        LOWEST DETECTABLE LIMITS FOR URINE DRUG SCREEN Drug Class       Cutoff (ng/mL) Amphetamine      1000 Barbiturate      200 Benzodiazepine   200 Tricyclics       300 Opiates          300 Cocaine          300 THC              50 11/23/2010 0646    Urinalysis:  Recent Labs Lab 09/11/13 1420  COLORURINE YELLOW  LABSPEC 1.011  PHURINE 5.0  GLUCOSEU NEGATIVE  HGBUR NEGATIVE  BILIRUBINUR NEGATIVE  KETONESUR NEGATIVE  PROTEINUR NEGATIVE  UROBILINOGEN 0.2  NITRITE NEGATIVE  LEUKOCYTESUR NEGATIVE   Medications: I have reviewed the patient's current medications. Scheduled Meds: . amiodarone  100 mg Oral Daily  . amLODipine  5 mg Oral Daily  . aspirin EC  81  mg Oral Daily  . carvedilol  6.25 mg Oral BID WC  . docusate sodium  100 mg Oral BID  . ferrous sulfate  325 mg Oral Q breakfast  . furosemide  20 mg Oral q morning - 10a  . heparin  5,000 Units Subcutaneous Q8H  . levothyroxine  25 mcg Oral QAC breakfast  . loteprednol  1 drop Right Eye q morning - 10a  . oxybutynin  5 mg Oral Daily  . pantoprazole  40 mg Oral Daily  . simvastatin  20 mg Oral q1800  . sodium chloride  3 mL Intravenous Q12H  . spironolactone  12.5 mg Oral QODAY  . terazosin  1 mg Oral QHS   Continuous Infusions:  PRN Meds:.acetaminophen, acetaminophen, albuterol, benzonatate Assessment/Plan: # Falls in the setting of chronic bradycardia  The patient has had multiple falls likely due to deconditioning and worsening foot pain and will require inpatient rehab before he will be safe for discharge home. It is unlikely but possible that this is due to symptomatic bradycardia. However, this bradycardia appears chronic and unchanged over the last few years  - Wound Care left foot wound  - Continue telemetry for  HR and signs of symptomatic bradycardia (d/c tele) - Consult SW for placement in SNF  - Falls precautions and PT OT eval  - Cogntive Assessment by Speech - Tylenol prn pain  -  B12 was WNL  # SOB  The patient has chronic SOB at baseline with no wheezing today.  CXR demonstrated a opacity of the left lower lobe (mild) that has been visualized before. There is concern for PNA. Patient received levofloxacin in the ED but this was discontinued with no clinical signs of PNA. Will consider continuing abx in AM.  - Albuterol  -Tessalon   # CHF (last EF 65% with severe AS, previous 40-45%) in setting of CAS HTN and HLD  - Continue home statin  - losartan, spironolactone, amlodipine, furosemide, coreg, ASA 81 mg  - Placed hold parameters for SBP less than 100  # HTN Low BP 80's/50's on 12/8, asymptomatic. Returned to [facility] after increased PO intake of fluids. Likely was due to hypovolemia resulting from poor PO intake in hospital. - Place hold parameters on meds for SBP < 100 - losartan, spironolactone, amlodipine, furosemide, coreg  # Paroxysmal afib  - decreased amiodarone from 200 mg qd to 100 mg qd after discussing case with Dr. Elease Hashimoto.  - Continue coreg 6.25 mg with hold parameters - Not anticoagulated at this time due to multiple recurrent diverticular GI bleeds   #OSA  Continue home CPAP.   Dispo: Disposition is deferred at this time, awaiting improvement of current medical problems. Anticipated discharge in approximately 1-2 day(s).  .   The patient does have a current PCP Jonah Blue, DO) and does need an [facility] hospital follow-up appointment after discharge.  The patient does not have transportation limitations that hinder transportation to clinic appointments.  .Services Needed at time of discharge: Y = Yes, Blank = No PT:   OT:   RN:   Equipment:   Other:     LOS: 4 days   Pleas Koch, MD 09/15/2013, 8:39 AM

## 2013-09-15 NOTE — Progress Notes (Signed)
1300  Sinus brady 30-40 's no sustain MD aware

## 2013-09-15 NOTE — Discharge Summary (Addendum)
Name: Eugene [name] MRN: [id number] DOB: 1924/01/02 79 y.o. PCP: Jonah Blue, DO  Date of Admission: 09/11/2013 12:20 PM Date of Discharge: 09/15/2013 Attending Physician: Burns Spain, MD  Discharge Diagnosis:  Principal Problem:   Multiple falls Active Problems:   HYPERLIPIDEMIA   Chronic systolic heart failure   Bradycardia    CAD (coronary artery disease)   Shortness of breath   Abnormality of gait   Ulcer of lower limb, unspecified- Bilateral foot  Discharge Medications:   Medication List    STOP taking these medications       ciprofloxacin 250 MG tablet  Commonly known as:  CIPRO      TAKE these medications       acetaminophen 325 MG tablet  Commonly known as:  TYLENOL  Take 2 tablets (650 mg total) by mouth every 6 (six) hours as needed for mild pain (or Fever >/= 101).     amiodarone 100 MG tablet  Commonly known as:  PACERONE  Take 1 tablet (100 mg total) by mouth daily.     amLODipine 5 MG tablet  Commonly known as:  NORVASC  Take 5 mg by mouth daily.     aspirin EC 81 MG tablet  Take 81 mg by mouth daily.     benzonatate 100 MG capsule  Commonly known as:  TESSALON PERLES  Take 1 capsule (100 mg total) by mouth 3 (three) times daily as needed for cough.     CALCIUM + D PO  Take 1 tablet by mouth daily.     carvedilol 3.125 MG tablet  Commonly known as:  COREG  Take 1 tablet (3.125 mg total) by mouth 2 (two) times daily with a meal.     docusate sodium 100 MG capsule  Commonly known as:  COLACE  Take 100 mg by mouth 2 (two) times daily.     ferrous sulfate 325 (65 FE) MG tablet  Take 325 mg by mouth daily with breakfast.     furosemide 20 MG tablet  Commonly known as:  LASIX  Take 1 tablet (20 mg total) by mouth every morning.     levothyroxine 25 MCG tablet  Commonly known as:  SYNTHROID, LEVOTHROID  Take 25 mcg by mouth daily before breakfast.     losartan 50 MG tablet  Commonly known as:  COZAAR  Take 50 mg by  mouth daily.     loteprednol 0.5 % ophthalmic suspension  Commonly known as:  LOTEMAX  Place 1 drop into the right eye every morning.     oxybutynin 5 MG 24 hr tablet  Commonly known as:  DITROPAN-XL  Take 1 tablet (5 mg total) by mouth daily.     pantoprazole 40 MG tablet  Commonly known as:  PROTONIX  Take 1 tablet (40 mg total) by mouth daily.     pravastatin 40 MG tablet  Commonly known as:  PRAVACHOL  Take 40 mg by mouth daily.     spironolactone 25 MG tablet  Commonly known as:  ALDACTONE  Take 0.5 tablets (12.5 mg total) by mouth every other day.     terazosin 1 MG capsule  Commonly known as:  HYTRIN  Take 1 capsule (1 mg total) by mouth at bedtime.     VITAMIN B-12 IJ  Inject 1,000 mcg as directed every 7 (seven) days. Every tuesday     zoster vaccine live (PF) 19400 UNT/0.65ML injection  Commonly known as:  ZOSTAVAX  Inject 19,400 Units  into the skin once.        Disposition and follow-up:   [name] was discharged from [facility] in Stable condition.  At the hospital follow up visit please address:  1.  Bradycardia, CHF, HTN, Dementia (halluciantions during hospitalization), Falls Risk, Foot Wound  2.  Labs / imaging needed at time of follow-up: BMP, Cognitive Assessment by Dr. Leonides Cave  3.  Pending labs/ test needing follow-up: None  Follow-up Appointments:   Discharge Instructions: Discharge Orders   Future Appointments Provider Department Dept Phone   09/24/2013 8:15 AM Jonah Blue, DO [facility] [phone]   08/18/2014 1:00 PM [facility]  CARDIOVASCULAR [address] [phone]   08/18/2014 1:30 PM Chuck Hint, MD [facility] [phone]   Future Orders Complete By Expires   Call MD for:  difficulty breathing, headache or visual disturbances  As directed    Call MD for:  persistant dizziness or light-headedness  As directed    Call MD  for:  persistant nausea and vomiting  As directed    Diet - low sodium heart healthy  As directed    Discharge instructions  As directed    Comments:     Please do not take Coreg until advised by PCP or cardiologist due to low HR and borderline low BP.   Please make your appointment with the neuropsychiatrist for a cognitive evalation.   Please decrease your amiodarone dose to 100 mg daily.  Please decrease your lasix dose to 20 mg daily.   Increase activity slowly  As directed       Consultations:    Procedures Performed:  Dg Chest 2 View  09/11/2013   CLINICAL DATA:  Cough.  Cough.  EXAM: CHEST  2 VIEW  COMPARISON:  Chest x-ray 10/01/2012. Marland Kitchen  FINDINGS: Mild infiltrate left lower lobe cannot be excluded. Lungs are otherwise clear of acute infiltrates. Mild subsegmental atelectasis present both lung bases. No pleural effusion and pneumothorax. Large airways and hilar structures normal. Cardiomegaly, normal pulmonary vascularity. Degenerative changes both shoulders. No acute bony abnormality  IMPRESSION: Mild left lower lobe infiltrate consistent with pneumonia.   Electronically Signed   By: Maisie Fus  Register   On: 09/11/2013 13:59   Dg Sacrum/coccyx  09/11/2013   CLINICAL DATA:  Sacrococcygeal pain following a fall.  EXAM: SACRUM AND COCCYX - 2+ VIEW  COMPARISON:  Pelvic CT dated 10/21/2012.  FINDINGS: Mild posterior subluxation of the distal coccygeal segment is again demonstrated. No acute fracture or subluxation seen. The previously demonstrated L4 superior endplate compression deformity and grade 1 anterolisthesis at the L4-5 level are unchanged. Facet degenerative changes throughout the lower lumbar spine.  IMPRESSION: No acute fracture or subluxation.   Electronically Signed   By: Gordan Payment M.D.   On: 09/11/2013 19:00   Ct Head Wo Contrast  09/11/2013   CLINICAL DATA:  79 year old male status post fall. Head injury. Severe headache. Initial encounter.  EXAM: CT HEAD WITHOUT CONTRAST   CT CERVICAL SPINE WITHOUT CONTRAST  TECHNIQUE: Multidetector CT imaging of the head and cervical spine was performed following the standard protocol without intravenous contrast. Multiplanar CT image reconstructions of the cervical spine were also generated.  COMPARISON:  10/01/2012.  FINDINGS: CT HEAD FINDINGS  Stable paranasal sinuses and mastoids. Left superior forehead scalp hematoma measuring up to 10 mm in thickness. Underlying left frontal bone intact. Calvarium stable and intact. Stable orbits soft tissues, postoperative changes on  the right.  Calcified atherosclerosis at the skull base. Stable dural calcifications. Stable cerebral volume. No ventriculomegaly. Tortuous intracranial artery is re- identified. No suspicious intracranial vascular hyperdensity. No intracranial mass effect. Patchy white matter hypodensity is stable. No evidence of cortically based acute infarction identified. No acute intracranial hemorrhage identified.  CT CERVICAL SPINE FINDINGS  Chronic bulky flowing anterior endplate osteophytes in the cervical spine. Stable straightening of cervical lordosis. *INSERT* skullbase partially calcified ligamentous hypertrophy re- identified about the odontoid. Cervicothoracic junction alignment is within normal limits. Bilateral posterior element alignment is within normal limits. Multilevel facet hypertrophy. No acute cervical spine fracture identified.  Stable lung apices. Grossly stable and intact visualized upper thoracic levels. Widespread arterial calcified atherosclerosis. Partially retropharyngeal course of the right common carotid artery. Otherwise negative paraspinal soft tissues.  IMPRESSION: 1. Left scalp hematoma without underlying fracture.  2. No acute intracranial abnormality.  3. No acute fracture or listhesis identified in the cervical spine. Ligamentous injury is not excluded. Diffuse idiopathic skeletal hyperostosis .   Electronically Signed   By: Augusto Gamble M.D.   On:  09/11/2013 15:30   Ct Cervical Spine Wo Contrast  09/11/2013   CLINICAL DATA:  79 year old male status post fall. Head injury. Severe headache. Initial encounter.  EXAM: CT HEAD WITHOUT CONTRAST  CT CERVICAL SPINE WITHOUT CONTRAST  TECHNIQUE: Multidetector CT imaging of the head and cervical spine was performed following the standard protocol without intravenous contrast. Multiplanar CT image reconstructions of the cervical spine were also generated.  COMPARISON:  10/01/2012.  FINDINGS: CT HEAD FINDINGS  Stable paranasal sinuses and mastoids. Left superior forehead scalp hematoma measuring up to 10 mm in thickness. Underlying left frontal bone intact. Calvarium stable and intact. Stable orbits soft tissues, postoperative changes on the right.  Calcified atherosclerosis at the skull base. Stable dural calcifications. Stable cerebral volume. No ventriculomegaly. Tortuous intracranial artery is re- identified. No suspicious intracranial vascular hyperdensity. No intracranial mass effect. Patchy white matter hypodensity is stable. No evidence of cortically based acute infarction identified. No acute intracranial hemorrhage identified.  CT CERVICAL SPINE FINDINGS  Chronic bulky flowing anterior endplate osteophytes in the cervical spine. Stable straightening of cervical lordosis. *INSERT* skullbase partially calcified ligamentous hypertrophy re- identified about the odontoid. Cervicothoracic junction alignment is within normal limits. Bilateral posterior element alignment is within normal limits. Multilevel facet hypertrophy. No acute cervical spine fracture identified.  Stable lung apices. Grossly stable and intact visualized upper thoracic levels. Widespread arterial calcified atherosclerosis. Partially retropharyngeal course of the right common carotid artery. Otherwise negative paraspinal soft tissues.  IMPRESSION: 1. Left scalp hematoma without underlying fracture.  2. No acute intracranial abnormality.  3. No  acute fracture or listhesis identified in the cervical spine. Ligamentous injury is not excluded. Diffuse idiopathic skeletal hyperostosis .   Electronically Signed   By: Augusto Gamble M.D.   On: 09/11/2013 15:30   Dg Shoulder Left  09/11/2013   CLINICAL DATA:  Fall.  EXAM: LEFT SHOULDER - 2+ VIEW  COMPARISON:  Chest x-ray 09/11/2013.  FINDINGS: Degenerative changes left shoulder involving the glenohumeral joint and acromioclavicular joint. Left shoulder is high-riding suggesting chronic rotator cuff tear. No acute abnormality identified. No evidence of fracture, dislocation, or separation. Tiny metallic foreign body noted in the anterior aspect of the left upper arm soft tissues. This is most likely old, no evidence of adjacent acute soft tissue injury.  IMPRESSION: 1. DJD, no acute abnormality. Probable chronic left rotator cuff tear.  2.  No evidence of  fracture, dislocation, or separation.   Electronically Signed   By: Maisie Fus  Register   On: 09/11/2013 14:44   Admission HPI: Eugene [name] is a 79 y.o. man w/ a complicated pmhx detailed below who presents with 3rd fall in the last week. All three falls appear to be 2/2 to tripping on slick floor, over stairs and over his other foot. His most recent fall was this morning and it occurred when he was trying to get to the door to get his meals on wheels. He reports that he slipped on the slick kitchen floor. This caused him to [facility] on his tailbone. He was unable to get up so he called a friend who called EMS. EMS arrived on the scene and found his HR to be 45. He was then brought the the ED for evaluation. He denies SOB, CP, lightheadedness, dizziness, LOC, weakness, incontinence of bowel or bladder. The patient is eating and drinking without problems. He is moving his bowels and bladder without problems. He denies palpitations. He states that his normal HR is 60.  Associated symptoms of left foot pain and wound for 1 week.   Hospital Course by problem  list: Principal Problem:   Multiple falls Active Problems:   HYPERLIPIDEMIA   Chronic systolic heart failure   Bradycardia    CAD (coronary artery disease)   Shortness of breath   Abnormality of gait   Ulcer of lower limb, unspecified- Bilateral foot   Morbid obesity   Eugene [name] is a 79 year old man who was admitted on 09/11/13 for multiple recent falls, three occuring within the past week, all thought to be due to mechanical factors (tripping on a slick floor, tripping over stairs and tripping over his other foot). He called EMS since he was unable to get himself up and was transported to the ED by ambulance since EMS noted he was bradycardic to 44. At the time of admission, he denied SOB, CP, lightheadedness, dizziness, LOC, weakness, incontinence of bowel or bladder. His physical exam was significant for bradycardia to 44, pulmonary exam was significant for wheezes and his extremities were significant only for bilateral lower extremity edema and a small wound on the dorsal plantar aspect of his left foot, draining serosanguinous fluid. CXR performed in the ED demonstrated LLL opacity, which is unchanged from prior imaging studies. A hospital course, arranged by problem, follows:   Falls in the setting of bradycardia: This was evaluated to likely be secondary to deconditioning and worsening foot pain, rather than bradycardia. PT and OT were consulted to work with him while inpatient. He was started on acetaminophen 650 mg every six hours as needed for his pain; he was discharged on acetaminophen 650 mg every six hours as needed for pain.   SOB: this is a chronic problem managed in the outpatient setting with albuterol and Tessalon Perles. Due to the unchanged nature of his CXR and the fact that his symptoms are not worsening, this is thought to be unrelated to his recent falls. Outpatient management with benzonate (Tesssalon Perles) 100 mg as needed up to three times daily and was given Duonebs  with albuterol. He was discharged on benzonate (Tesssalon Perles) 100 mg as needed every eight hours.   CHF in the setting of HTN and HLD: This is a chronic problem managed in the outpatient setting with pravastatin 40 mg daily, losartan 50 mg daily, spironolactone 12.5 mg every other day, amlodipine 200 mg daily, furosemide 40 mg daily and ASA 81  mg. His home medications were continued, except for substituting simvastatin for pravastatin. Amiodarone was decreased to 100 mg daily after discussion with Dr. Elease Hashimoto. Coreg was changed to 3.125 mg BID due to borderline low BP. Recommend f/u of anti-hypertensives as outpatient.   Paroxysmal atrial fibrillation: This is a chronic problem managed in the outpatient setting with amiodarone. He is not anticoagulated due to his recent history of diverticular bleeds. His outpatient regimen was started, though his amiodarone was decreased from 200 mg qd to 100 mg qd; he discharged on amiodarone 100 qd.  At the time of discharge, patient is eating and drinking without problems. He is moving his bowels and bladder without problems and is able to ambulate.   Dementia Dementia is likely contributing to the patients increased confusion. Furthermore, his hallucination while in the hospital is likely due to chronic dementia. I recommended the patient to undergo formal neuropsych eva by Dr. Leonides Cave as an outpatient. I spoke with the patient guardian, Eugene [name], who is agreement with this plan. Dr. Maxwell Marion office was infromed of the patient and will contact Eugene [name] to schedule the appointment.  [facility] RT placed patient on cpap auto 20cmH20 max and 5cmH20 min via full face mask. He toelrated CPAP at night without problems.  Morbid Obesity   Discharge Vitals:   BP 90/50  Pulse 45  Temp(Src) 97.5 F (36.4 C) (Oral)  Resp 19  Ht 4\' 9"  (1.448 m)  Wt 202 lb 2.6 oz (91.7 kg)  BMI 43.74 kg/m2  SpO2 97%  Discharge Labs:  No results found for this or any previous  visit (from the past 24 hour(s)).  Signed: Pleas Koch, MD 09/15/2013, 11:26 AM   Time Spent on Discharge: 35 minutes Services Ordered on Discharge: None Equipment Ordered on Discharge: None

## 2013-09-16 DIAGNOSIS — E669 Obesity, unspecified: Secondary | ICD-10-CM

## 2013-09-16 NOTE — Clinical Social Work Placement (Addendum)
    Clinical Social Work Department CLINICAL SOCIAL WORK PLACEMENT NOTE 09/16/2013  Patient:  Eugene [name], Eugene [name]  Account Number:  [id number] Admit date:  09/11/2013  Clinical Social Worker:  Jacelyn Grip  Date/time:  09/12/2013 02:55 PM  Clinical Social Work is seeking post-discharge placement for this patient at the following level of care:   SKILLED NURSING   (*CSW will update this form in Epic as items are completed)   09/12/2013  Patient/family provided with [facility] Department of Clinical Social Work's list of facilities offering this level of care within the geographic area requested by the patient (or if unable, by the patient's family).  09/12/2013  Patient/family informed of their freedom to choose among providers that offer the needed level of care, that participate in Medicare, Medicaid or managed care program needed by the patient, have an available bed and are willing to accept the patient.  09/12/2013  Patient/family informed of [facility]' ownership interest in [facility], as well as of the fact that they are under no obligation to receive care at this facility.  PASARR submitted to EDS on 09/12/2013 PASARR number received from EDS on 09/12/2013  FL2 transmitted to all facilities in geographic area requested by pt/family on  09/12/2013 FL2 transmitted to all facilities within larger geographic area on   Patient informed that his/her managed care company has contracts with or will negotiate with  certain facilities, including the following:     Patient/family informed of bed offers received:  09/13/2013 Patient chooses bed at [facility] Physician recommends and patient chooses bed at    Patient to be transferred to [facility] on  09/15/2013 Patient to be transferred to facility by Ambulance  Sharin Mons)  The following physician request were entered in Epic:   Additional Comments: 09/15/13  OK per  MD for d/c today to SNF.  Patient and family notified and are in agreement with SNF placement. Nursing notified and called report.  No further CSW needs identified.  CSW signing off.  Lorri Frederick. Evaluna Utke, LCSWA  [phone]

## 2013-09-17 NOTE — Discharge Summary (Signed)
   Date: 09/17/2013    Patient name: Eugene [name]  MRN: [id number]  Date of birth: 05-17-1924  I evaluated the patient on the day of discharge and discussed the discharge plan with my resident team. I agree with the discharge documentation and disposition.     Aletta Edouard 09/17/2013, 2:52 PM

## 2013-09-17 NOTE — Discharge Summary (Signed)
I agree with students note, See my d/c summary for additional comments.  Angelina Sheriff, MD

## 2013-09-21 NOTE — ED Provider Notes (Signed)
Medical screening examination/treatment/procedure(s) were performed by non-physician practitioner and as supervising physician I was immediately available for consultation/collaboration.  EKG Interpretation    Date/Time:    Ventricular Rate:    PR Interval:    QRS Duration:   QT Interval:    QTC Calculation:   R Axis:     Text Interpretation:                Roney Marion, MD 09/21/13 2315

## 2013-09-24 ENCOUNTER — Ambulatory Visit: Payer: Medicare Other | Admitting: Internal Medicine

## 2013-10-06 ENCOUNTER — Telehealth: Payer: Self-pay | Admitting: *Deleted

## 2013-10-06 NOTE — Telephone Encounter (Signed)
Call made to [facility] to follow up neuropsychology referral placed on 12/09 by Dr Glendell Docker.  Pt was dischaged to SNF ([facility]) on 09/15/2013, but pt's legal guardian expects pt to be discharged from SNF in the next 1-2 weeks.  I spoke with Dr Leonides Cave at [facility] and he confirmed pt's appt time to be Feb 9th at 2pm , which was scheduled w/Mr [name] (legal guardian).  Mr Ane Payment is aware of the appt time/date and will be transporting pt to this visit as he is still concerned about pt's decreased mental status over the last few months. Phone call complete.Kingsley Spittle Cassady12/30/20141:34 PM

## 2013-10-20 ENCOUNTER — Other Ambulatory Visit: Payer: Self-pay | Admitting: *Deleted

## 2013-10-22 ENCOUNTER — Ambulatory Visit: Payer: Medicare Other

## 2013-10-23 ENCOUNTER — Ambulatory Visit: Payer: Medicare Other

## 2013-10-28 ENCOUNTER — Telehealth: Payer: Self-pay | Admitting: *Deleted

## 2013-10-28 NOTE — Telephone Encounter (Signed)
Call from Summer [facility] -  # [phone] Pt started home health yesterday.  Nurse wants to verify some medications.  He is on spironolactone 25 mg take 1/2 tab every day  and Lasix 20 mg one daily, also carvedilol 6.25 mg one tab twice daily. Pulse was 52, BP 102/60 He has pedal edema, 2 plus, lungs clear and pt is stable.   D/c notes have lasix 20 mg one daily- carvedilol 3.125 mg twice daily and spironolactone 25 mg 1/2 tab every other day. D/c note also states hold carvedilol until you see cards or PCP.  Pt Hospital d/c 12/9 then went to SNF.  Home 1/20 HFU 2/19 with Dr Kem KaysPaya

## 2013-10-28 NOTE — Telephone Encounter (Signed)
Notes have been faxed

## 2013-10-28 NOTE — Telephone Encounter (Signed)
Called RN at number listed, no answer. Inocencio HomesGayle, please fax her the D/C summary from 09/15/2013. The medications are clearly stated there. Coreg was decreased to 3.125 mg twice daily. Thanks.

## 2013-10-30 ENCOUNTER — Ambulatory Visit: Payer: Medicare Other

## 2013-11-03 ENCOUNTER — Other Ambulatory Visit: Payer: Self-pay | Admitting: Cardiology

## 2013-11-03 IMAGING — CR DG CHEST 2V
1 series · 1 of 1 positions shown · non-contrast
Comparison: Chest radiograph 10/31/2011

CLINICAL DATA: Cough, short of breath

CHEST - 2 VIEW

[view not recorded]
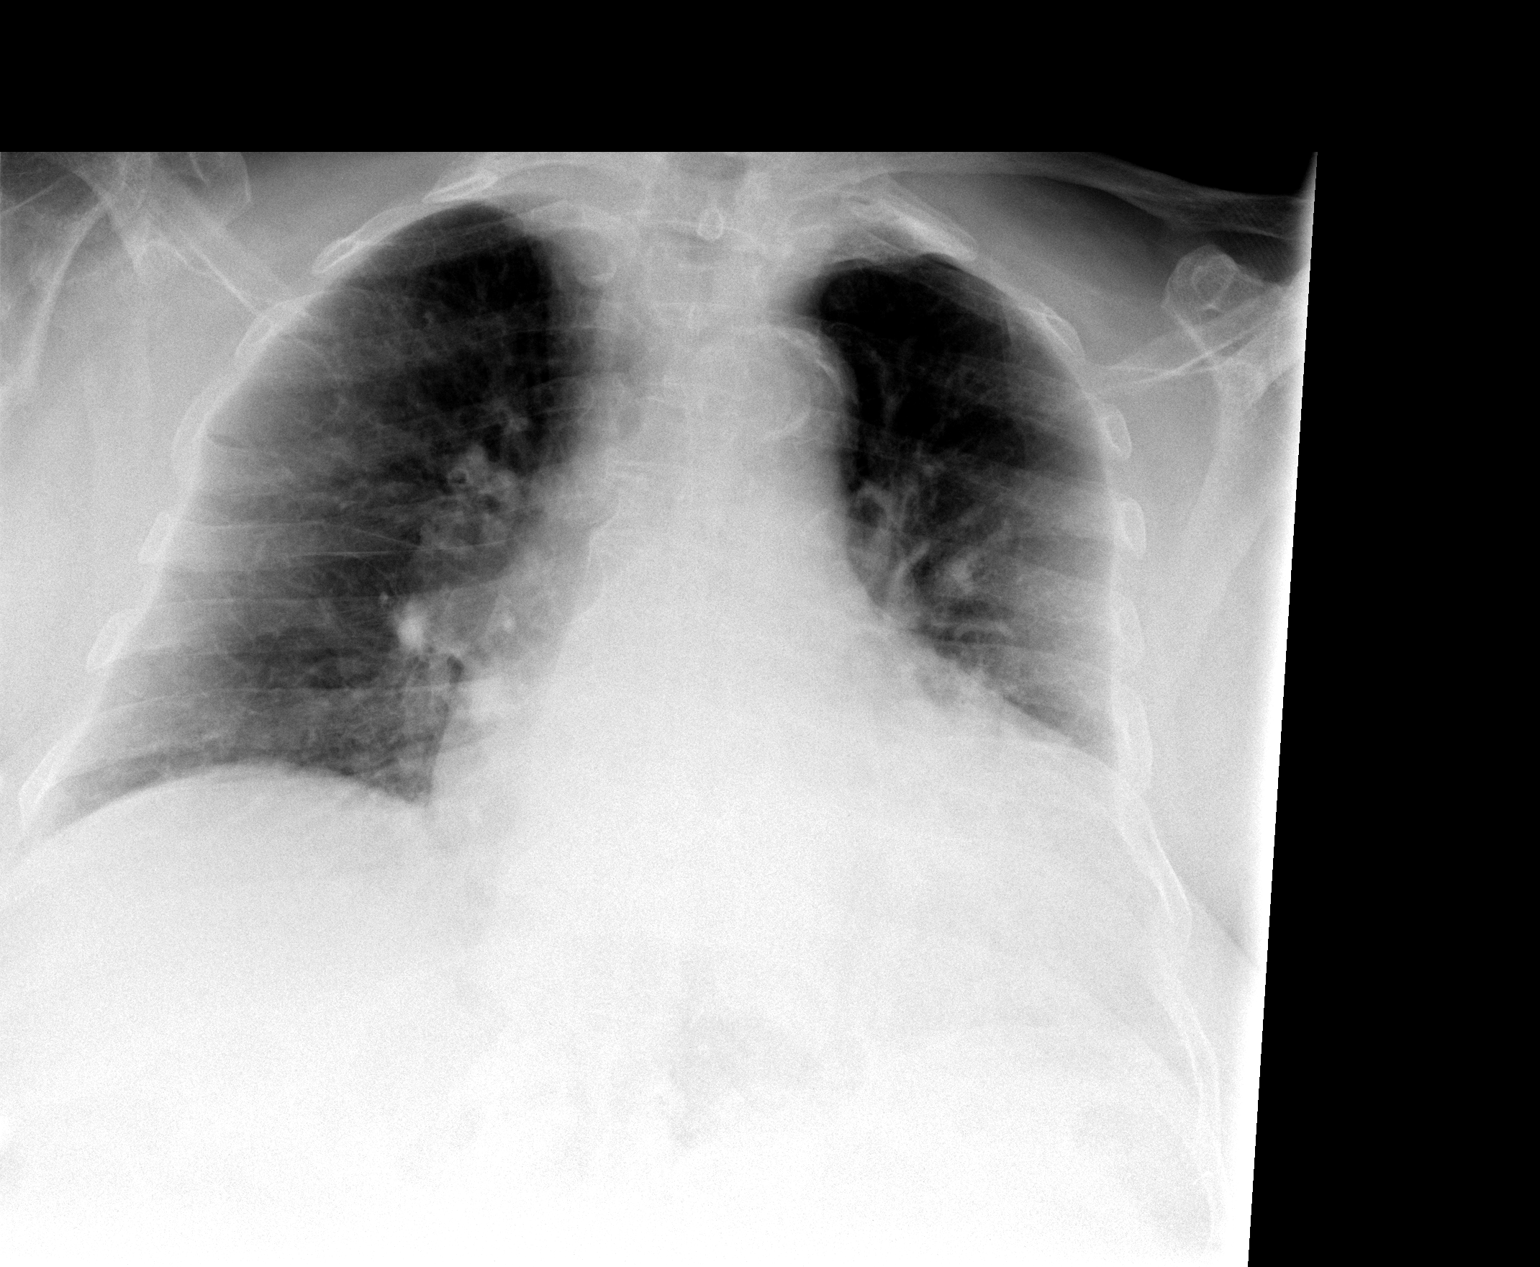

[1 of 1 positions shown; findings below may reference images not displayed]

FINDINGS: Stable enlarged cardiac silhouette.  There is a left
lower lobe atelectasis.  There is central venous pulmonary
congestion.  No evidence of focal consolidation.  No pneumothorax.
IMPRESSION: 1..  Cardiomegaly and  left lower lobe atelectasis is similar
prior.
2.  Central venous pulmonary congestion is similar to prior.

## 2013-11-03 MED ORDER — AMIODARONE HCL 100 MG PO TABS: 100.0000 mg | ORAL_TABLET | Freq: Every day | ORAL | Status: DC

## 2013-11-06 ENCOUNTER — Telehealth: Payer: Self-pay | Admitting: *Deleted

## 2013-11-06 NOTE — Telephone Encounter (Signed)
Call from [place], [facility] - # [phone] Nurse reports pts goal weight is 200 lb's, patient was 213 when they started working with him.  His weight today is 206.  They are asking if they can adjust the goal weight so they don't need to inform us each visit if his weight is over 200 lb's Can they change parameters to call for 3 lb weight gain over night or 5 lb weight gain in a week?  Next week will be the last nursing visit. PT and OT will remain seeing pt.

## 2013-11-06 NOTE — Telephone Encounter (Signed)
Yes, that is reasonable. Thanks.

## 2013-11-06 NOTE — Telephone Encounter (Signed)
[facility]

## 2013-11-10 ENCOUNTER — Encounter: Payer: Self-pay | Admitting: Vascular Surgery

## 2013-11-11 ENCOUNTER — Encounter: Payer: Self-pay | Admitting: Vascular Surgery

## 2013-11-11 ENCOUNTER — Ambulatory Visit (INDEPENDENT_AMBULATORY_CARE_PROVIDER_SITE_OTHER): Payer: Medicare Other | Admitting: Vascular Surgery

## 2013-11-11 ENCOUNTER — Other Ambulatory Visit: Payer: Self-pay | Admitting: *Deleted

## 2013-11-11 VITALS — BP 143/94 | HR 79 | Ht <= 58 in | Wt 213.0 lb

## 2013-11-11 DIAGNOSIS — L98499 Non-pressure chronic ulcer of skin of other sites with unspecified severity: Secondary | ICD-10-CM

## 2013-11-11 DIAGNOSIS — I739 Peripheral vascular disease, unspecified: Secondary | ICD-10-CM

## 2013-11-11 DIAGNOSIS — I70209 Unspecified atherosclerosis of native arteries of extremities, unspecified extremity: Secondary | ICD-10-CM

## 2013-11-11 MED ORDER — OXYBUTYNIN CHLORIDE 5 MG PO TABS: 5.0000 mg | ORAL_TABLET | Freq: Two times a day (BID) | ORAL | Status: DC

## 2013-11-11 NOTE — Telephone Encounter (Signed)
Dr Kem KaysPaya, the oxybutynin 24 hr tablet is not on the preferred list for insurance provider, could you prescribe one of the regular forms of medication

## 2013-11-11 NOTE — Telephone Encounter (Signed)
Pt, responsible caregiver and HHN informed of change of med and they may pick up this pm at pharmacy

## 2013-11-11 NOTE — Telephone Encounter (Signed)
Also could we do 90 day script?

## 2013-11-11 NOTE — Addendum Note (Signed)
Addended by: Sharee PimpleMCCHESNEY, Taya Ashbaugh K on: 11/11/2013 05:38 PM   Modules accepted: Orders

## 2013-11-11 NOTE — Assessment & Plan Note (Signed)
He does have some very small wounds in both feet as described above but no cellulitis or drainage I think these will heal without any problem. He does have biphasic Doppler signals in both feet. I think his swelling in the legs is likely related to his heart failure and he scheduled to follow up with his cardiologist and primary care physician soon. He also has some history of chronic venous insufficiency and for this reason leg elevation would also be helpful. We'll see him back in 6 months with follow up ABIs. He knows to call sooner if he has problems.

## 2013-11-11 NOTE — Progress Notes (Signed)
[facility] [place] [facility]  Patient name: Eugene [name] MRN: [id number] DOB: 04-18-1924 Sex: male  REASON FOR VISIT: Follow up of peripheral vascular disease.   HPI: Eugene [name] is a 80 y.o. male who I last saw on 08/12/2013. He had originally presented with a nonhealing wound on his left great toe. He underwent an arteriogram was found to have a 95% stenosis of the proximal left anterior tibial artery which was successfully ballooned. Follow up study showed an ABI of 100% on the left with a biphasic posterior tibial signal and a triphasic anterior tibial signal. The wound on the left toe had healed at that point.  He comes in sooner than his regularly scheduled appointment as he developed swelling in both legs and also some small wounds. He states that the swelling in his legs came on gradually. He does not remember any specific injury to his feet. His activity is very limited although he is ambulatory with a walker.   Past Medical History  Diagnosis Date  . Glaucoma     Right eye  . Diverticulosis     multiple hospital admissions for GI bleed, has an episode roughly every 6 months,, last  colonscopy in 2006 showed diverticulosis, was evaluated by Dr. Madilyn FiremanHayes in the hospital in 2011 for lower GI bleed andd it  was suggested that the patient was not actively bleeding at that time and given his comorbid conditions colonoscopy was deferred. He is also chronically constipated  . Macular degeneration of right eye   . Allergic rhinitis   . CAD (coronary artery disease)     s/p PCI in 1991;  Cardiac catheterization 11/02/11: Ostial LAD 95% with aneurysmal dilatation after this, proximal circumflex 70% with a superior branch 30-40%, proximal RCA 75% and 90%, mid RCA 50% and 90% before a PDA, then occluded.;    high risk for CABG;PCI 11/14/11: Bare-metal stent to the proximal LAD    . OSA on CPAP   . Hypertension   . Hyperlipidemia   . Chronic systolic heart failure    Echocardiogram 11/02/11: Moderate LVH, EF 30-35%, multiple wall motion abnormalities, critical aortic stenosis, AVA 0.6, mean gradient 45, mild MR, severe LAE  . Supraventricular tachycardia     2 syndromes-nonsustained atrial tachycardia//adenosine responsive diuretic positive reentry probably AV node reentry  . Aortic stenosis     s/p AV balloon valvuloplasty by Dr. [name] 11/2011  . Ischemic cardiomyopathy   . H/O: GI bleed     recurrent  . Heart murmur   . CHF (congestive heart failure)   . Pneumonia 2012  . Hypothyroidism   . GERD (gastroesophageal reflux disease)   . Falls frequently     3 times in the past week/notes 09/11/2013  . Myocardial infarction 1991; 10/28/2011  . Exertional shortness of breath     "not much" (09/11/2013)  . Arthritis     "might have slight; have trouble w/my fingers" (09/11/2013)   Family History  Problem Relation Age of Onset  . Cancer Mother   . Cancer Father   . Heart disease Sister     Heart disease before age 660   SOCIAL HISTORY: History  Substance Use Topics  . Smoking status: Former Smoker -- 3.00 packs/day for 21 years    Types: Cigarettes    Quit date: 10/08/1958  . Smokeless tobacco: Never Used  . Alcohol Use: 0.0 oz/week   Allergies  Allergen Reactions  . Penicillins Swelling    REACTION: "turns red"  Current Outpatient Prescriptions  Medication Sig Dispense Refill  . acetaminophen (TYLENOL) 325 MG tablet Take 2 tablets (650 mg total) by mouth every 6 (six) hours as needed for mild pain (or Fever >/= 101).  90 tablet  0  . amiodarone (PACERONE) 100 MG tablet Take 1 tablet (100 mg total) by mouth daily.  30 tablet  1  . amLODipine (NORVASC) 5 MG tablet Take 5 mg by mouth daily.      Marland Kitchen aspirin EC 81 MG tablet Take 81 mg by mouth daily.      . benzonatate (TESSALON PERLES) 100 MG capsule Take 1 capsule (100 mg total) by mouth 3 (three) times daily as needed for cough.  30 capsule  1  . Calcium Carbonate-Vitamin D (CALCIUM + D PO)  Take 1 tablet by mouth daily.       . carvedilol (COREG) 3.125 MG tablet Take 1 tablet (3.125 mg total) by mouth 2 (two) times daily with a meal.  30 tablet  0  . Cyanocobalamin (VITAMIN B-12 IJ) Inject 1,000 mcg as directed every 7 (seven) days. Every tuesday      . docusate sodium (COLACE) 100 MG capsule Take 100 mg by mouth 2 (two) times daily.       . ferrous sulfate 325 (65 FE) MG tablet Take 325 mg by mouth daily with breakfast.      . furosemide (LASIX) 20 MG tablet Take 1 tablet (20 mg total) by mouth every morning.  30 tablet  0  . levothyroxine (SYNTHROID, LEVOTHROID) 25 MCG tablet Take 25 mcg by mouth daily before breakfast.      . losartan (COZAAR) 50 MG tablet Take 50 mg by mouth daily.      Marland Kitchen loteprednol (LOTEMAX) 0.5 % ophthalmic suspension Place 1 drop into the right eye every morning.       Marland Kitchen oxybutynin (DITROPAN-XL) 5 MG 24 hr tablet Take 1 tablet (5 mg total) by mouth daily.  30 tablet  3  . pantoprazole (PROTONIX) 40 MG tablet Take 1 tablet (40 mg total) by mouth daily.  90 tablet  2  . pravastatin (PRAVACHOL) 40 MG tablet Take 40 mg by mouth daily.      Marland Kitchen spironolactone (ALDACTONE) 25 MG tablet Take 0.5 tablets (12.5 mg total) by mouth every other day.  15 tablet  2  . terazosin (HYTRIN) 1 MG capsule Take 1 capsule (1 mg total) by mouth at bedtime.  30 capsule  2  . zoster vaccine live, PF, (ZOSTAVAX) 16109 UNT/0.65ML injection Inject 19,400 Units into the skin once.  1 each  0   No current facility-administered medications for this visit.   REVIEW OF SYSTEMS: Arly.Keller ] denotes positive finding; [  ] denotes negative finding  CARDIOVASCULAR:  [ ]  chest pain   [ ]  chest pressure   [ ]  palpitations   [ ]  orthopnea   Arly.Keller ] dyspnea on exertion   [ ]  claudication   [ ]  rest pain   [ ]  DVT   [ ]  phlebitis PULMONARY:   [ ]  productive cough   [ ]  asthma   [ ]  wheezing NEUROLOGIC:   Arly.Keller ] weakness  [ ]  paresthesias  [ ]  aphasia  [ ]  amaurosis  [ ]  dizziness HEMATOLOGIC:   [ ]  bleeding  problems   [ ]  clotting disorders MUSCULOSKELETAL:  [ ]  joint pain   [ ]  joint swelling Arly.Keller ] leg swelling GASTROINTESTINAL: [ ]   blood in stool  [ ]   hematemesis GENITOURINARY:  [ ]   dysuria  [ ]   hematuria PSYCHIATRIC:  [ ]  history of major depression INTEGUMENTARY:  [ ]  rashes  [ ]  ulcers CONSTITUTIONAL:  [ ]  fever   [ ]  chills  PHYSICAL EXAM: Filed Vitals:   11/11/13 1123  BP: 143/94  Pulse: 79  Height: 4\' 9"  (1.448 m)  Weight: 213 lb (96.616 kg)  SpO2: 95%   Body mass index is 46.08 kg/(m^2). GENERAL: The patient is a well-nourished male, in no acute distress. The vital signs are documented above. CARDIOVASCULAR: There is a regular rate and rhythm. I do not detect carotid bruits. He has palpable femoral pulses bilaterally. He has biphasic dorsalis pedis signals bilaterally. PULMONARY: There is good air exchange bilaterally without wheezing or rales. ABDOMEN: Soft and non-tender with normal pitched bowel sounds.  MUSCULOSKELETAL: There are no major deformities or cyanosis. NEUROLOGIC: No focal weakness or paresthesias are detected. SKIN: There are no ulcers or rashes noted. He has is very small eschar at the tip of the left great toe and also the tip of the right great toe. This is about 2 mm in diameter. Also there is a small callus at the base of his left fifth metatarsal on the plantar aspect of his left foot. PSYCHIATRIC: The patient has a normal affect.   MEDICAL ISSUES:  Atherosclerotic PVD with ulceration He does have some very small wounds in both feet as described above but no cellulitis or drainage I think these will heal without any problem. He does have biphasic Doppler signals in both feet. I think his swelling in the legs is likely related to his heart failure and he scheduled to follow up with his cardiologist and primary care physician soon. He also has some history of chronic venous insufficiency and for this reason leg elevation would also be helpful. We'll see him  back in 6 months with follow up ABIs. He knows to call sooner if he has problems.   Return in about 6 months (around 05/11/2014).  DICKSON,CHRISTOPHER S [facility] [place] Beeper: [phone]

## 2013-11-12 ENCOUNTER — Encounter: Payer: Self-pay | Admitting: Vascular Surgery

## 2013-11-15 ENCOUNTER — Emergency Department (HOSPITAL_COMMUNITY): Payer: Medicare Other

## 2013-11-15 ENCOUNTER — Encounter (HOSPITAL_COMMUNITY): Payer: Self-pay | Admitting: Emergency Medicine

## 2013-11-15 ENCOUNTER — Emergency Department (HOSPITAL_COMMUNITY)
Admission: EM | Admit: 2013-11-15 | Discharge: 2013-11-15 | Disposition: A | Discharge status: Home / Self Care | Payer: Medicare Other | Attending: Emergency Medicine | Admitting: Emergency Medicine

## 2013-11-15 DIAGNOSIS — D72829 Elevated white blood cell count, unspecified: Secondary | ICD-10-CM | POA: Diagnosis present

## 2013-11-15 DIAGNOSIS — K219 Gastro-esophageal reflux disease without esophagitis: Secondary | ICD-10-CM | POA: Insufficient documentation

## 2013-11-15 DIAGNOSIS — W010XXA Fall on same level from slipping, tripping and stumbling without subsequent striking against object, initial encounter: Secondary | ICD-10-CM | POA: Insufficient documentation

## 2013-11-15 DIAGNOSIS — I498 Other specified cardiac arrhythmias: Secondary | ICD-10-CM | POA: Insufficient documentation

## 2013-11-15 DIAGNOSIS — Z880 Allergy status to penicillin: Secondary | ICD-10-CM | POA: Insufficient documentation

## 2013-11-15 DIAGNOSIS — R945 Abnormal results of liver function studies: Secondary | ICD-10-CM

## 2013-11-15 DIAGNOSIS — E785 Hyperlipidemia, unspecified: Secondary | ICD-10-CM | POA: Insufficient documentation

## 2013-11-15 DIAGNOSIS — R7989 Other specified abnormal findings of blood chemistry: Secondary | ICD-10-CM | POA: Diagnosis present

## 2013-11-15 DIAGNOSIS — M1990 Unspecified osteoarthritis, unspecified site: Secondary | ICD-10-CM | POA: Insufficient documentation

## 2013-11-15 DIAGNOSIS — E039 Hypothyroidism, unspecified: Secondary | ICD-10-CM | POA: Insufficient documentation

## 2013-11-15 DIAGNOSIS — I5022 Chronic systolic (congestive) heart failure: Secondary | ICD-10-CM | POA: Insufficient documentation

## 2013-11-15 DIAGNOSIS — Z9981 Dependence on supplemental oxygen: Secondary | ICD-10-CM | POA: Insufficient documentation

## 2013-11-15 DIAGNOSIS — G4733 Obstructive sleep apnea (adult) (pediatric): Secondary | ICD-10-CM | POA: Insufficient documentation

## 2013-11-15 DIAGNOSIS — I2510 Atherosclerotic heart disease of native coronary artery without angina pectoris: Secondary | ICD-10-CM | POA: Insufficient documentation

## 2013-11-15 DIAGNOSIS — R1110 Vomiting, unspecified: Secondary | ICD-10-CM | POA: Insufficient documentation

## 2013-11-15 DIAGNOSIS — Z9181 History of falling: Secondary | ICD-10-CM | POA: Insufficient documentation

## 2013-11-15 DIAGNOSIS — R011 Cardiac murmur, unspecified: Secondary | ICD-10-CM | POA: Insufficient documentation

## 2013-11-15 DIAGNOSIS — Y9389 Activity, other specified: Secondary | ICD-10-CM | POA: Insufficient documentation

## 2013-11-15 DIAGNOSIS — Y9289 Other specified places as the place of occurrence of the external cause: Secondary | ICD-10-CM | POA: Insufficient documentation

## 2013-11-15 DIAGNOSIS — W19XXXA Unspecified fall, initial encounter: Secondary | ICD-10-CM | POA: Diagnosis present

## 2013-11-15 DIAGNOSIS — I252 Old myocardial infarction: Secondary | ICD-10-CM | POA: Insufficient documentation

## 2013-11-15 DIAGNOSIS — Z8669 Personal history of other diseases of the nervous system and sense organs: Secondary | ICD-10-CM | POA: Insufficient documentation

## 2013-11-15 DIAGNOSIS — I509 Heart failure, unspecified: Secondary | ICD-10-CM | POA: Insufficient documentation

## 2013-11-15 DIAGNOSIS — Z9861 Coronary angioplasty status: Secondary | ICD-10-CM | POA: Insufficient documentation

## 2013-11-15 DIAGNOSIS — Z9889 Other specified postprocedural states: Secondary | ICD-10-CM | POA: Insufficient documentation

## 2013-11-15 DIAGNOSIS — I10 Essential (primary) hypertension: Secondary | ICD-10-CM | POA: Insufficient documentation

## 2013-11-15 DIAGNOSIS — Z8701 Personal history of pneumonia (recurrent): Secondary | ICD-10-CM | POA: Insufficient documentation

## 2013-11-15 DIAGNOSIS — Z954 Presence of other heart-valve replacement: Secondary | ICD-10-CM | POA: Insufficient documentation

## 2013-11-15 LAB — URINALYSIS W MICROSCOPIC + REFLEX CULTURE
GLUCOSE, UA: NEGATIVE mg/dL
HGB URINE DIPSTICK: NEGATIVE
Ketones, ur: NEGATIVE mg/dL
LEUKOCYTES UA: NEGATIVE
Nitrite: NEGATIVE
PH: 5.5 (ref 5.0–8.0)
Protein, ur: NEGATIVE mg/dL
Specific Gravity, Urine: 1.015 (ref 1.005–1.030)
Urobilinogen, UA: 1 mg/dL (ref 0.0–1.0)

## 2013-11-15 LAB — COMPREHENSIVE METABOLIC PANEL
ALBUMIN: 3.3 g/dL — AB (ref 3.5–5.2)
ALT: 174 U/L — AB (ref 0–53)
AST: 178 U/L — ABNORMAL HIGH (ref 0–37)
Alkaline Phosphatase: 219 U/L — ABNORMAL HIGH (ref 39–117)
BUN: 20 mg/dL (ref 6–23)
CALCIUM: 9.1 mg/dL (ref 8.4–10.5)
CO2: 22 mEq/L (ref 19–32)
CREATININE: 1.24 mg/dL (ref 0.50–1.35)
Chloride: 103 mEq/L (ref 96–112)
GFR calc Af Amer: 58 mL/min — ABNORMAL LOW (ref >90)
GFR calc non Af Amer: 50 mL/min — ABNORMAL LOW (ref >90)
Glucose, Bld: 119 mg/dL — ABNORMAL HIGH (ref 70–99)
Potassium: 4 mEq/L (ref 3.7–5.3)
SODIUM: 140 meq/L (ref 137–147)
Total Bilirubin: 0.8 mg/dL (ref 0.3–1.2)
Total Protein: 6.2 g/dL (ref 6.0–8.3)

## 2013-11-15 LAB — CBC WITH DIFFERENTIAL/PLATELET
BASOS PCT: 0 % (ref 0–1)
Basophils Absolute: 0 10*3/uL (ref 0.0–0.1)
Eosinophils Absolute: 0 10*3/uL (ref 0.0–0.7)
Eosinophils Relative: 0 % (ref 0–5)
HCT: 40.7 % (ref 39.0–52.0)
Hemoglobin: 14 g/dL (ref 13.0–17.0)
Lymphocytes Relative: 4 % — ABNORMAL LOW (ref 12–46)
Lymphs Abs: 0.8 10*3/uL (ref 0.7–4.0)
MCH: 32 pg (ref 26.0–34.0)
MCHC: 34.4 g/dL (ref 30.0–36.0)
MCV: 92.9 fL (ref 78.0–100.0)
MONO ABS: 0.9 10*3/uL (ref 0.1–1.0)
Monocytes Relative: 5 % (ref 3–12)
NEUTROS PCT: 91 % — AB (ref 43–77)
Neutro Abs: 16.1 10*3/uL — ABNORMAL HIGH (ref 1.7–7.7)
PLATELETS: 162 10*3/uL (ref 150–400)
RBC: 4.38 MIL/uL (ref 4.22–5.81)
RDW: 13.7 % (ref 11.5–15.5)
WBC: 17.8 10*3/uL — AB (ref 4.0–10.5)

## 2013-11-15 LAB — TROPONIN I: Troponin I: <0.3 ng/mL (ref <0.30)

## 2013-11-15 MED ORDER — SODIUM CHLORIDE 0.9 % IV BOLUS (SEPSIS)
250.0000 mL | INTRAVENOUS | Status: AC
  Administered 2013-11-15: 250 mL via INTRAVENOUS

## 2013-11-15 NOTE — ED Notes (Signed)
Ambulated patient with dr. Romeo AppleHarrison.

## 2013-11-15 NOTE — ED Notes (Signed)
Received pt from home via EMS with c/o while trying to close the doggy door, pt loss his balance and fell. Pt denies head injury or + LOC. Upon arrival of EMS pt BP noted to be 79/49, HR 80-96 AFIB and LBBB. Pt reports that he vomited at 1200, but denies nausea, dizziness or pain at present. Pt reports shortness of breath. Pt initally refused EMS transport but POA Jim [name] request pt to be evaluated.

## 2013-11-15 NOTE — ED Notes (Signed)
Patient transported to X-ray 

## 2013-11-15 NOTE — ED Provider Notes (Signed)
CSN: [id number]     Arrival date & time 11/15/13  1717 History   First MD Initiated Contact with Patient 11/15/13 1728     Chief Complaint  Patient presents with  . Fall  . Shortness of Breath   (Consider location/radiation/quality/duration/timing/severity/associated sxs/prior Treatment) Patient is a 80 y.o. male presenting with fall and shortness of breath. The history is provided by the patient.  Fall This is a new problem. The current episode started less than 1 hour ago. Episode frequency: once. The problem has been resolved. Pertinent negatives include no chest pain, no abdominal pain, no headaches and no shortness of breath. Nothing aggravates the symptoms. Nothing relieves the symptoms. He has tried nothing for the symptoms. The treatment provided no relief.  Shortness of Breath Associated symptoms: vomiting (1 episode of emesis this morning)   Associated symptoms: no abdominal pain, no chest pain, no cough, no fever, no headaches and no neck pain     Past Medical History  Diagnosis Date  . Glaucoma     Right eye  . Diverticulosis     multiple hospital admissions for GI bleed, has an episode roughly every 6 months,, last  colonscopy in 2006 showed diverticulosis, was evaluated by Dr. Madilyn Fireman in the hospital in 2011 for lower GI bleed andd it  was suggested that the patient was not actively bleeding at that time and given his comorbid conditions colonoscopy was deferred. He is also chronically constipated  . Macular degeneration of right eye   . Allergic rhinitis   . CAD (coronary artery disease)     s/p PCI in 1991;  Cardiac catheterization 11/02/11: Ostial LAD 95% with aneurysmal dilatation after this, proximal circumflex 70% with a superior branch 30-40%, proximal RCA 75% and 90%, mid RCA 50% and 90% before a PDA, then occluded.;    high risk for CABG;PCI 11/14/11: Bare-metal stent to the proximal LAD    . OSA on CPAP   . Hypertension   . Hyperlipidemia   . Chronic systolic heart  failure     Echocardiogram 11/02/11: Moderate LVH, EF 30-35%, multiple wall motion abnormalities, critical aortic stenosis, AVA 0.6, mean gradient 45, mild MR, severe LAE  . Supraventricular tachycardia     2 syndromes-nonsustained atrial tachycardia//adenosine responsive diuretic positive reentry probably AV node reentry  . Aortic stenosis     s/p AV balloon valvuloplasty by Dr. Excell Seltzer 11/2011  . Ischemic cardiomyopathy   . H/O: GI bleed     recurrent  . Heart murmur   . CHF (congestive heart failure)   . Pneumonia 2012  . Hypothyroidism   . GERD (gastroesophageal reflux disease)   . Falls frequently     3 times in the past week/notes 09/11/2013  . Myocardial infarction 1991; 10/28/2011  . Exertional shortness of breath     "not much" (09/11/2013)  . Arthritis     "might have slight; have trouble w/my fingers" (09/11/2013)   Past Surgical History  Procedure Laterality Date  . Glaucoma surgery      Implantation of Baerveldt glaucoma device lant with scleral reinforcement using tutoplast tissue graft right eye. [Other]  . Coronary angioplasty with stent placement  Jan. 23, 2013  . Corneal transplant Right   . Cardiac valve replacement  11/30/2011    aortic valve  . Angioplasty Left 03/30/2013    tibial  . Tonsillectomy  1946  . Cataract extraction w/ intraocular lens  implant, bilateral Bilateral    Family History  Problem Relation Age of Onset  .  Cancer Mother   . Cancer Father   . Heart disease Sister     Heart disease before age 78   History  Substance Use Topics  . Smoking status: Former Smoker -- 3.00 packs/day for 21 years    Types: Cigarettes    Quit date: 10/08/1958  . Smokeless tobacco: Never Used  . Alcohol Use: 0.0 oz/week    Review of Systems  Constitutional: Negative for fever.  HENT: Negative for drooling and rhinorrhea.   Eyes: Negative for pain.  Respiratory: Negative for cough, chest tightness and shortness of breath.   Cardiovascular: Negative for  chest pain and leg swelling.  Gastrointestinal: Positive for vomiting (1 episode of emesis this morning). Negative for nausea, abdominal pain and diarrhea.  Genitourinary: Negative for dysuria and hematuria.  Musculoskeletal: Negative for gait problem and neck pain.  Skin: Negative for color change.  Neurological: Negative for numbness and headaches.  Hematological: Negative for adenopathy.  Psychiatric/Behavioral: Negative for behavioral problems.  All other systems reviewed and are negative.    Allergies  Penicillins  Home Medications   Current Outpatient Rx  Name  Route  Sig  Dispense  Refill  . acetaminophen (TYLENOL) 325 MG tablet   Oral   Take 2 tablets (650 mg total) by mouth every 6 (six) hours as needed for mild pain (or Fever >/= 101).   90 tablet   0   . amiodarone (PACERONE) 100 MG tablet   Oral   Take 1 tablet (100 mg total) by mouth daily.   30 tablet   1   . amLODipine (NORVASC) 5 MG tablet   Oral   Take 5 mg by mouth daily.         Marland Kitchen. aspirin EC 81 MG tablet   Oral   Take 81 mg by mouth daily.         . benzonatate (TESSALON PERLES) 100 MG capsule   Oral   Take 1 capsule (100 mg total) by mouth 3 (three) times daily as needed for cough.   30 capsule   1   . Calcium Carbonate-Vitamin D (CALCIUM + D PO)   Oral   Take 1 tablet by mouth daily.          . carvedilol (COREG) 3.125 MG tablet   Oral   Take 1 tablet (3.125 mg total) by mouth 2 (two) times daily with a meal.   30 tablet   0   . Cyanocobalamin (VITAMIN B-12 IJ)   Injection   Inject 1,000 mcg as directed every 7 (seven) days. Every tuesday         . docusate sodium (COLACE) 100 MG capsule   Oral   Take 100 mg by mouth 2 (two) times daily.          . ferrous sulfate 325 (65 FE) MG tablet   Oral   Take 325 mg by mouth daily with breakfast.         . furosemide (LASIX) 20 MG tablet   Oral   Take 1 tablet (20 mg total) by mouth every morning.   30 tablet   0   .  levothyroxine (SYNTHROID, LEVOTHROID) 25 MCG tablet   Oral   Take 25 mcg by mouth daily before breakfast.         . losartan (COZAAR) 50 MG tablet   Oral   Take 50 mg by mouth daily.         Marland Kitchen. loteprednol (LOTEMAX) 0.5 % ophthalmic suspension  Right Eye   Place 1 drop into the right eye every morning.          Marland Kitchen oxybutynin (DITROPAN) 5 MG tablet   Oral   Take 1 tablet (5 mg total) by mouth 2 (two) times daily.   180 tablet   1   . pantoprazole (PROTONIX) 40 MG tablet   Oral   Take 1 tablet (40 mg total) by mouth daily.   90 tablet   2   . pravastatin (PRAVACHOL) 40 MG tablet   Oral   Take 40 mg by mouth daily.         Marland Kitchen spironolactone (ALDACTONE) 25 MG tablet   Oral   Take 0.5 tablets (12.5 mg total) by mouth every other day.   15 tablet   2   . terazosin (HYTRIN) 1 MG capsule   Oral   Take 1 capsule (1 mg total) by mouth at bedtime.   30 capsule   2   . zoster vaccine live, PF, (ZOSTAVAX) 16109 UNT/0.65ML injection   Subcutaneous   Inject 19,400 Units into the skin once.   1 each   0    BP 96/54  Pulse 76  Temp(Src) 98.9 F (37.2 C) (Oral)  Resp 24  Ht 5\' 7"  (1.702 m)  Wt 207 lb (93.895 kg)  BMI 32.41 kg/m2  SpO2 97% Physical Exam  Nursing note and vitals reviewed. Constitutional: He is oriented to person, place, and time. He appears well-developed and well-nourished.  HENT:  Head: Normocephalic and atraumatic.  Right Ear: External ear normal.  Left Ear: External ear normal.  Nose: Nose normal.  Mouth/Throat: Oropharynx is clear and moist. No oropharyngeal exudate.  Eyes: Conjunctivae and EOM are normal. Pupils are equal, round, and reactive to light.  Neck: Normal range of motion. Neck supple.  Cardiovascular: Normal rate, regular rhythm, normal heart sounds and intact distal pulses.  Exam reveals no gallop and no friction rub.   No murmur heard. Pulmonary/Chest: Effort normal and breath sounds normal. No respiratory distress. He has no  wheezes.  Abdominal: Soft. Bowel sounds are normal. He exhibits no distension. There is no tenderness. There is no rebound and no guarding.  Musculoskeletal: Normal range of motion. He exhibits no edema and no tenderness.  Neurological: He is alert and oriented to person, place, and time. He has normal strength. No cranial nerve deficit or sensory deficit. Coordination and gait normal.  Normal finger to nose bilaterally.  Normal speech and understanding. Patient is alert and oriented x3.  He typically ambulates with a walker but is able to walk around the room with minimal assistance by the staff.  Skin: Skin is warm and dry.  Psychiatric: He has a normal mood and affect. His behavior is normal.    ED Course  Procedures (including critical care time) Labs Review Labs Reviewed  CBC WITH DIFFERENTIAL - Abnormal; Notable for the following:    WBC 17.8 (*)    Neutrophils Relative % 91 (*)    Neutro Abs 16.1 (*)    Lymphocytes Relative 4 (*)    All other components within normal limits  COMPREHENSIVE METABOLIC PANEL - Abnormal; Notable for the following:    Glucose, Bld 119 (*)    Albumin 3.3 (*)    AST 178 (*)    ALT 174 (*)    Alkaline Phosphatase 219 (*)    GFR calc non Af Amer 50 (*)    GFR calc Af Amer 58 (*)  All other components within normal limits  URINALYSIS W MICROSCOPIC + REFLEX CULTURE - Abnormal; Notable for the following:    APPearance CLOUDY (*)    Bilirubin Urine SMALL (*)    All other components within normal limits  TROPONIN I   Imaging Review Dg Chest 2 View  11/15/2013   CLINICAL DATA:  Possible infection.  EXAM: CHEST  2 VIEW  COMPARISON:  09/11/2013.  FINDINGS: Cardiomegaly and aortic atherosclerosis appear stable for technique. There are lower lung volumes with mild resulting bibasilar atelectasis. There is no consolidation or significant pleural effusion. Posttraumatic deformity of the right humerus is noted. Mild mid thoracic compression deformities  appear stable.  IMPRESSION: Low lung volumes with mild resulting bibasilar atelectasis. No evidence of pneumonia.   Electronically Signed   By: Roxy Horseman M.D.   On: 11/15/2013 20:41    EKG Interpretation   None       Date: 11/15/2013  Rate: 77  Rhythm: normal sinus rhythm  QRS Axis: left  Intervals: normal  ST/T Wave abnormalities: normal  Conduction Disutrbances:none  Narrative Interpretation: LVH, old inf infarct, no new ST or T wave changes c/w ischemia  Old EKG Reviewed: unchanged   MDM   1. Elevated LFTs   2. Leukocytosis   3. Fall    .5:39 PM 80 y.o. male presents with a mechanical fall which occurred prior to arrival. The patient states that he had bent down to close the cat door and then lost his balance sliding down the wall onto the ground. He states that he was on the ground for approximately 10 minutes. He pressed his life alert button because he was unable to stand. He denies hitting his head or loss of consciousness. He has no complaints on exam currently. He is afebrile and his blood pressure is soft here with initial readings in the 90s over 50s. He got 250cc IVF en route, will give another 250cc IVF. He has a grossly normal neurologic exam. Will get screening labs.   9:16 PM: I interpreted/reviewed the labs and/or imaging which were non-contributory.  BP has improved w/ minimal IVF and remained normal. Pt's pastor is here and there is a program in place where people stay w/ the pt. Renato Gails will have someone stay w/ the pt tonight. He has lab abn's including a leukocytosis which is non-spec given pt's well appearance and no complaints. Also mildly elev LFT's which are new. Pt denies abd pain and repeat abd exams show a soft abdomen. Pastor to arrange close f/u w/ his pcp next week.  I have discussed the diagnosis/risks/treatment options with the patient and his pastor and believe the pt to be eligible for discharge home to follow-up with his pcp next week for repeat labs.  We also discussed returning to the ED immediately if new or worsening sx occur. We discussed the sx which are most concerning (e.g., recurrent falls, fever, abd pain) that necessitate immediate return. Medications administered to the patient during their visit and any new prescriptions provided to the patient are listed below.  Medications given during this visit Medications  sodium chloride 0.9 % bolus 250 mL (0 mLs Intravenous Stopped 11/15/13 1901)      Junius Argyle, MD 11/16/13 1353

## 2013-11-15 NOTE — ED Notes (Signed)
Patient denies pain at this time.  Asking for something to eat.  Will wait until after his xray

## 2013-11-15 NOTE — ED Notes (Signed)
Pt unable to urinate at this time.  

## 2013-11-16 DIAGNOSIS — R413 Other amnesia: Secondary | ICD-10-CM

## 2013-11-18 ENCOUNTER — Ambulatory Visit (INDEPENDENT_AMBULATORY_CARE_PROVIDER_SITE_OTHER): Payer: Medicare Other | Admitting: Internal Medicine

## 2013-11-18 ENCOUNTER — Encounter: Payer: Self-pay | Admitting: Internal Medicine

## 2013-11-18 VITALS — BP 103/64 | HR 69 | Temp 99.1°F | Wt 215.0 lb

## 2013-11-18 DIAGNOSIS — R945 Abnormal results of liver function studies: Principal | ICD-10-CM

## 2013-11-18 DIAGNOSIS — E785 Hyperlipidemia, unspecified: Secondary | ICD-10-CM

## 2013-11-18 DIAGNOSIS — W19XXXA Unspecified fall, initial encounter: Secondary | ICD-10-CM

## 2013-11-18 DIAGNOSIS — R7989 Other specified abnormal findings of blood chemistry: Secondary | ICD-10-CM

## 2013-11-18 DIAGNOSIS — D72829 Elevated white blood cell count, unspecified: Secondary | ICD-10-CM

## 2013-11-18 LAB — COMPLETE METABOLIC PANEL WITH GFR
ALBUMIN: 2.7 g/dL — AB (ref 3.5–5.2)
ALT: 50 U/L (ref 0–53)
AST: 16 U/L (ref 0–37)
Alkaline Phosphatase: 119 U/L — ABNORMAL HIGH (ref 39–117)
BUN: 26 mg/dL — ABNORMAL HIGH (ref 6–23)
CHLORIDE: 102 meq/L (ref 96–112)
CO2: 25 mEq/L (ref 19–32)
Calcium: 9 mg/dL (ref 8.4–10.5)
Creat: 1.1 mg/dL (ref 0.50–1.35)
GFR, EST NON AFRICAN AMERICAN: 60 mL/min
GFR, Est African American: 69 mL/min
GLUCOSE: 120 mg/dL — AB (ref 70–99)
POTASSIUM: 4.8 meq/L (ref 3.5–5.3)
SODIUM: 138 meq/L (ref 135–145)
TOTAL PROTEIN: 6.2 g/dL (ref 6.0–8.3)
Total Bilirubin: 0.6 mg/dL (ref 0.3–1.2)

## 2013-11-18 LAB — CBC WITH DIFFERENTIAL/PLATELET
BASOS ABS: 0 10*3/uL (ref 0.0–0.1)
BASOS PCT: 0 % (ref 0–1)
Eosinophils Absolute: 0.1 10*3/uL (ref 0.0–0.7)
Eosinophils Relative: 1 % (ref 0–5)
HCT: 38.8 % — ABNORMAL LOW (ref 39.0–52.0)
Hemoglobin: 13.1 g/dL (ref 13.0–17.0)
Lymphocytes Relative: 7 % — ABNORMAL LOW (ref 12–46)
Lymphs Abs: 0.7 10*3/uL (ref 0.7–4.0)
MCH: 31.9 pg (ref 26.0–34.0)
MCHC: 33.8 g/dL (ref 30.0–36.0)
MCV: 94.4 fL (ref 78.0–100.0)
MONO ABS: 1.1 10*3/uL — AB (ref 0.1–1.0)
Monocytes Relative: 11 % (ref 3–12)
Neutro Abs: 8 10*3/uL — ABNORMAL HIGH (ref 1.7–7.7)
Neutrophils Relative %: 81 % — ABNORMAL HIGH (ref 43–77)
Platelets: 147 10*3/uL — ABNORMAL LOW (ref 150–400)
RBC: 4.11 MIL/uL — ABNORMAL LOW (ref 4.22–5.81)
RDW: 14.1 % (ref 11.5–15.5)
WBC: 10 10*3/uL (ref 4.0–10.5)

## 2013-11-18 MED ORDER — PRAVASTATIN SODIUM 40 MG PO TABS: 40.0000 mg | ORAL_TABLET | Freq: Every day | ORAL | Status: DC

## 2013-11-18 NOTE — Progress Notes (Signed)
Patient ID: Eugene [name], male   DOB: 08-Dec-1923, 80 y.o.   MRN: [id number] HPI The patient is a 80 y.o. male with a history of   ROS: General: no fevers, chills, changes in weight, changes in appetite Skin: no rash HEENT: no blurry vision, hearing changes, sore throat Pulm: no dyspnea, coughing, wheezing CV: no chest pain, palpitations, shortness of breath Abd: no abdominal pain, nausea/vomiting, diarrhea/constipation GU: no dysuria, hematuria, polyuria Ext: no arthralgias, myalgias Neuro: no weakness, numbness, or tingling  Filed Vitals:   11/18/13 1519  BP: 103/64  Pulse: 69  Temp: 99.1 F (37.3 C)  SpO2 93%  Physical Exam General: alert, cooperative, and in no apparent distress HEENT: pupils equal round and reactive to light, vision grossly intact, oropharynx clear and non-erythematous  Neck: supple, no lymphadenopathy, JVD, or carotid bruits Lungs: clear to ascultation bilaterally, normal work of respiration, no wheezes, rales, ronchi Heart: regular rate and rhythm, no murmurs, gallops, or rubs Abdomen: soft, non-tender, non-distended, normal bowel sounds Extremities: 2+ DP/PT pulses bilaterally, no cyanosis, clubbing, or edema Neurologic: alert & oriented X3, cranial nerves II-XII intact, strength grossly intact, sensation intact to light touch  Current Outpatient Prescriptions on File Prior to Visit  Medication Sig Dispense Refill  . acetaminophen (TYLENOL) 325 MG tablet Take 2 tablets (650 mg total) by mouth every 6 (six) hours as needed for mild pain (or Fever >/= 101).  90 tablet  0  . amiodarone (PACERONE) 100 MG tablet Take 1 tablet (100 mg total) by mouth daily.  30 tablet  1  . amLODipine (NORVASC) 5 MG tablet Take 5 mg by mouth daily.      Marland Kitchen aspirin EC 81 MG tablet Take 81 mg by mouth daily.      . benzonatate (TESSALON PERLES) 100 MG capsule Take 1 capsule (100 mg total) by mouth 3 (three) times daily as needed for cough.  30 capsule  1  . Calcium  Carbonate-Vitamin D (CALCIUM + D PO) Take 1 tablet by mouth daily.       . carvedilol (COREG) 3.125 MG tablet Take 1 tablet (3.125 mg total) by mouth 2 (two) times daily with a meal.  30 tablet  0  . Cyanocobalamin (VITAMIN B-12 IJ) Inject 1,000 mcg as directed every 7 (seven) days. Every tuesday      . docusate sodium (COLACE) 100 MG capsule Take 100 mg by mouth 2 (two) times daily.       . ferrous sulfate 325 (65 FE) MG tablet Take 325 mg by mouth daily with breakfast.      . furosemide (LASIX) 20 MG tablet Take 1 tablet (20 mg total) by mouth every morning.  30 tablet  0  . levothyroxine (SYNTHROID, LEVOTHROID) 25 MCG tablet Take 25 mcg by mouth daily before breakfast.      . losartan (COZAAR) 50 MG tablet Take 50 mg by mouth daily.      Marland Kitchen loteprednol (LOTEMAX) 0.5 % ophthalmic suspension Place 1 drop into the right eye every morning.       Marland Kitchen oxybutynin (DITROPAN) 5 MG tablet Take 1 tablet (5 mg total) by mouth 2 (two) times daily.  180 tablet  1  . pantoprazole (PROTONIX) 40 MG tablet Take 1 tablet (40 mg total) by mouth daily.  90 tablet  2  . pravastatin (PRAVACHOL) 40 MG tablet Take 40 mg by mouth daily.      Marland Kitchen spironolactone (ALDACTONE) 25 MG tablet Take 0.5 tablets (12.5 mg total) by  mouth every other day.  15 tablet  2  . terazosin (HYTRIN) 1 MG capsule Take 1 capsule (1 mg total) by mouth at bedtime.  30 capsule  2  . zoster vaccine live, PF, (ZOSTAVAX) [id number] UNT/0.65ML injection Inject 19,400 Units into the skin once.  1 each  0   No current facility-administered medications on file prior to visit.    Assessment/Plan

## 2013-11-18 NOTE — Progress Notes (Signed)
Patient ID: NATION CRADLE, male   DOB: [birth year], 80 y.o.   MRN: [id number] HPI The patient is a 80 y.o. male with a history of SVT, HTN, chronic venous insufficiency, CAD, PVD w/ foot ulcers, Aortic stenosis, Hyperlipidemia, acute renal insufficiency, bradycardia, CHF (systolic) who presents for an ED follow up visit.  Patient was evaluated in the ED on 2/8 after he fell. Patient notes that prior to his ED visit he had 1 episode of N/V, no diarrhea. Patient notes that he was bending over to shut the cat door when he felt as though his legs were too weak to push himself back up to standing, so he slid down the wall. He did not loose consciousness nor did he hit his head. There were no associated symptoms such as SOB, dizziness, CP, palpitations, diaphoresis, bowel or bladder incontinence. In the ED, patient was found to have elevated LFTs (AST 178, ALT 174, Alk phos 219) as well as a leukocytosis w/ neutrophil predominance (WBC count 17.8). He is here for follow up of these lab abnormalities. Patient feels as though he has been doing fairly well at home since his ED visit.  Of note, patient was hospitalized in Dec. 2014 for 3 falls which were thought to be mechanical in nature. Patient had not had a fall since this admission until Sunday and has not fallen since Sunday this week.  Patient uses a walker at home and per his pastor he does well using the walker.   ROS: General: no fevers, chills, changes in appetite  Skin: ulcers to b/l feet HEENT: no blurry vision, sore throat, HA Pulm: no dyspnea, coughing, wheezing CV: no chest pain, palpitations, shortness of breath Abd: no abdominal pain, nausea/vomiting, diarrhea/constipation GU: no dysuria, hematuria Ext: no arthralgias, myalgias Neuro: no weakness, numbness, or tingling  Filed Vitals:   11/18/13 1519  BP: 103/64  Pulse: 69  Temp: 99.1 F (37.3 C)   Physical Exam General: pleasant elderly man; alert, cooperative, and in no apparent  distress HEENT: pupils equal round and reactive to light, vision grossly intact, oropharynx clear and non-erythematous; patient is hard of hearing Neck: supple Lungs: clear to ascultation bilaterally, normal work of respiration Heart: regular rate and rhythm, no murmurs, gallops, or rubs Abdomen: soft, non-tender, non-distended, normal bowel sounds Extremities: warm b/l, 1-2+ pitting edema to BLE; there are a few healing ulcerations w/ overlying brown crust to b/l great toes and multiple small scattered crusted plaques to BLE anteriorly; there is one 2x1cm ulceration to L plantar foot laterally; no drainage, erythema, warmth or induration to any of these lesions Neurologic: alert & oriented X3, cranial nerves II-XII grossly intact, strength grossly intact, sensation intact to light touch  Current Outpatient Prescriptions on File Prior to Visit  Medication Sig Dispense Refill  . acetaminophen (TYLENOL) 325 MG tablet Take 2 tablets (650 mg total) by mouth every 6 (six) hours as needed for mild pain (or Fever >/= 101).  90 tablet  0  . amiodarone (PACERONE) 100 MG tablet Take 1 tablet (100 mg total) by mouth daily.  30 tablet  1  . amLODipine (NORVASC) 5 MG tablet Take 5 mg by mouth daily.      Marland Kitchen aspirin EC 81 MG tablet Take 81 mg by mouth daily.      . benzonatate (TESSALON PERLES) 100 MG capsule Take 1 capsule (100 mg total) by mouth 3 (three) times daily as needed for cough.  30 capsule  1  . Calcium Carbonate-Vitamin D (  CALCIUM + D PO) Take 1 tablet by mouth daily.       . carvedilol (COREG) 3.125 MG tablet Take 1 tablet (3.125 mg total) by mouth 2 (two) times daily with a meal.  30 tablet  0  . Cyanocobalamin (VITAMIN B-12 IJ) Inject 1,000 mcg as directed every 7 (seven) days. Every tuesday      . docusate sodium (COLACE) 100 MG capsule Take 100 mg by mouth 2 (two) times daily.       . ferrous sulfate 325 (65 FE) MG tablet Take 325 mg by mouth daily with breakfast.      . furosemide (LASIX) 20  MG tablet Take 1 tablet (20 mg total) by mouth every morning.  30 tablet  0  . levothyroxine (SYNTHROID, LEVOTHROID) 25 MCG tablet Take 25 mcg by mouth daily before breakfast.      . losartan (COZAAR) 50 MG tablet Take 50 mg by mouth daily.      Marland Kitchen loteprednol (LOTEMAX) 0.5 % ophthalmic suspension Place 1 drop into the right eye every morning.       Marland Kitchen oxybutynin (DITROPAN) 5 MG tablet Take 1 tablet (5 mg total) by mouth 2 (two) times daily.  180 tablet  1  . pantoprazole (PROTONIX) 40 MG tablet Take 1 tablet (40 mg total) by mouth daily.  90 tablet  2  . pravastatin (PRAVACHOL) 40 MG tablet Take 40 mg by mouth daily.      Marland Kitchen spironolactone (ALDACTONE) 25 MG tablet Take 0.5 tablets (12.5 mg total) by mouth every other day.  15 tablet  2  . terazosin (HYTRIN) 1 MG capsule Take 1 capsule (1 mg total) by mouth at bedtime.  30 capsule  2  . zoster vaccine live, PF, (ZOSTAVAX) [id number] UNT/0.65ML injection Inject 19,400 Units into the skin once.  1 each  0   No current facility-administered medications on file prior to visit.    Assessment/Plan

## 2013-11-18 NOTE — Assessment & Plan Note (Signed)
Patient noted to have leukocytosis of 17.8 when in the ED on 2/8. Patient without signs of infection at this time and his UA and CXR were both wnl. Patient does not have new complaints today suggestive of infection. He does have some scattered ulcerations to his feet and anterior lower legs b/l, but these are healing well. I have no concern that these areas are a source of infection. Patient also with a new transaminitis on lab work in the ED on 2/8. It is unclear if these laboratory abnormalities are related or not. -check CBC today, if WBC count is persistently elevated we may need to have the patient return to clinic for further work up

## 2013-11-18 NOTE — Assessment & Plan Note (Signed)
Refilled pravastatin today 

## 2013-11-18 NOTE — Assessment & Plan Note (Addendum)
Patient is completely asymptomatic. I do not have any explanation for the elevated LFTs today, though patient did have 1 episode of N/V on Sunday prior to his ED visit. Perhaps 2/2 pravastatin effect. Another consideration is hepatic congestion as patient does have known systolic CHF and today has pitting BLE edema, which may be somewhat worse than his baseline.  -repeat CMP today -if LFTs are persistently elevated patient will likely need to return to clinic for further work up.

## 2013-11-18 NOTE — Assessment & Plan Note (Signed)
No falls since he was seen and evaluated in the ED on 11/15/13. Patient uses a walker at home and per his pastor he does well with the walker, but does not always use it when he is at home. I encouraged patient to continue using the walker as his falls appear to be more mechanical in nature and I believe future falls can be prevented if he uses the walker consistently.

## 2013-11-18 NOTE — Patient Instructions (Signed)
Thank you for your visit.  Please follow up with your PCP on Feb. 19th as previously scheduled. However, depending on your lab work we may call you and ask you to come into clinic earlier than this. We also want you to call our clinic if you develop worsening shortness of breath, leg swelling, chest pain.   Today I refilled your pravastatin.  We checked some basic lab work and I will call you with the results.

## 2013-11-19 ENCOUNTER — Telehealth: Payer: Self-pay | Admitting: Internal Medicine

## 2013-11-19 NOTE — Telephone Encounter (Signed)
I called Mr. Eugene [name] this morning and informed him of the results of his lab work that was done yesterday. I told him that his liver function and the white count both looked to be back to normal. He has a follow up appointment with his PCP next week and he will keep this appointment.  I also called patient's pastor and legal guardian, Mr. Eugene [name], and informed him of the results. He will arrange for patient to come back to clinic to see his PCP for a routine visit next week.

## 2013-11-26 ENCOUNTER — Encounter: Payer: Medicare Other | Admitting: Internal Medicine

## 2013-11-30 ENCOUNTER — Ambulatory Visit: Payer: Medicare Other | Admitting: Internal Medicine

## 2013-12-03 ENCOUNTER — Encounter: Payer: Medicare Other | Admitting: Internal Medicine

## 2013-12-07 ENCOUNTER — Telehealth: Payer: Self-pay | Admitting: *Deleted

## 2013-12-07 NOTE — Telephone Encounter (Signed)
If the intensity of the cramps is severe, I would advise the patient to be seen as soon as possible. Differential could be anything, - PAD, DVT, electrolyte abnormalities, myopathy, myalgia. It is hard to advise anything without clinical exam.

## 2013-12-07 NOTE — Telephone Encounter (Signed)
Pt unable to come in today.  Scheduled for tomorrow AM

## 2013-12-07 NOTE — Telephone Encounter (Signed)
Call from [place], caregiver for pt - # [phone] Pt is c/o severe leg cramps at night.  Cramps in left leg, around knee area.  He can stretch leg with relief but it returns soon.  He also has pain to the left knee, rates it 6/10. Tried tylenol with out relief.   No pain with ambulation or when sitting in chair.  Onset 3 weeks ago. No change in color or temperature to left leg.  He was told [employer] nurse that his Kcl may be low so that is why he is calling, also not sleeping at night is a problem for him.  Last OV 2/11 and Potassium was 4.8. Please advise.

## 2013-12-08 ENCOUNTER — Ambulatory Visit (INDEPENDENT_AMBULATORY_CARE_PROVIDER_SITE_OTHER): Payer: Medicare Other | Admitting: Internal Medicine

## 2013-12-08 ENCOUNTER — Encounter: Payer: Self-pay | Admitting: Internal Medicine

## 2013-12-08 VITALS — BP 99/70 | HR 78 | Temp 97.1°F | Ht 67.0 in | Wt 212.0 lb

## 2013-12-08 DIAGNOSIS — Z0000 Encounter for general adult medical examination without abnormal findings: Secondary | ICD-10-CM

## 2013-12-08 DIAGNOSIS — G4733 Obstructive sleep apnea (adult) (pediatric): Secondary | ICD-10-CM

## 2013-12-08 DIAGNOSIS — I739 Peripheral vascular disease, unspecified: Secondary | ICD-10-CM

## 2013-12-08 DIAGNOSIS — L98499 Non-pressure chronic ulcer of skin of other sites with unspecified severity: Secondary | ICD-10-CM

## 2013-12-08 DIAGNOSIS — R252 Cramp and spasm: Secondary | ICD-10-CM

## 2013-12-08 MED ORDER — ZOSTER VACCINE LIVE 19400 UNT/0.65ML ~~LOC~~ SOLR: 0.6500 mL | Freq: Once | SUBCUTANEOUS | Status: DC

## 2013-12-08 NOTE — Assessment & Plan Note (Addendum)
Having issues adjusting the mask nightly and cpap machine is no longer working, pt reports last sleep study ~1991; per EPIC records last CPAP titration 07/1999 Will order split night sleep study; discussed with PCP Dr. Kem KaysPaya

## 2013-12-08 NOTE — Patient Instructions (Signed)
   Stretch your legs before bedtime  Untuck the bed linens at night  You may also find that a pillow behind your knees will help the cramps as well  We have also given you a prescription for the shingles vaccination.  We will schedule you for another sleep study.   Leg Cramps Leg cramps that occur during exercise can be caused by poor circulation or dehydration. However, muscle cramps that occur at rest or during the night are usually not due to any serious medical problem. Heat cramps may cause muscle spasms during hot weather.  CAUSES There is no clear cause for muscle cramps. However, dehydration may be a factor for those who do not drink enough fluids and those who exercise in the heat. Imbalances in the level of sodium, potassium, calcium or magnesium in the muscle tissue may also be a factor. Some medications, such as water pills (diuretics), may cause loss of chemicals that the body needs (like sodium and potassium) and cause muscle cramps. TREATMENT   Make sure your diet has enough fluids and essential minerals for the muscle to work normally.  Avoid strenuous exercise for several days if you have been having frequent leg cramps.  Stretch and massage the cramped muscle for several minutes.  Some medicines may be helpful in some patients with night cramps. Only take over-the-counter or prescription medicines as directed by your caregiver. SEEK IMMEDIATE MEDICAL CARE IF:   Your leg cramps become worse.  Your foot becomes cold, numb, or blue. Document Released: 11/01/2004 Document Revised: 12/17/2011 Document Reviewed: 10/19/2008 [facility] Patient Information 2014 [employer] [place], [place].

## 2013-12-08 NOTE — Assessment & Plan Note (Signed)
Will print and give RX for zostavax.   Pt states that he never received the RX in 2013.

## 2013-12-08 NOTE — Progress Notes (Signed)
   Subjective:    Patient ID: Eugene [name], male    DOB: 1924/04/04, 80 y.o.   MRN: [id number]  HPI  Pt presents today with complaints of nighttime leg cramps especially on the left side.  Needed to sleep in recliner last night which he states worked well in relieving cramps. This is a chronic issue of which he previously had his electrolytes tested which were within normal limits.  Also reports that his cpap machine is broken and mask needs some adjusting to fit well.    Review of Systems  Constitutional: Negative for fever and fatigue.  Eyes: Negative.   Respiratory: Negative for cough and shortness of breath.   Cardiovascular: Negative for chest pain.  Gastrointestinal: Negative.   Genitourinary: Negative.   Musculoskeletal:       Left lateral leg cramp at night   Skin: Negative for rash.  Neurological: Negative.   Hematological: Does not bruise/bleed easily.  Psychiatric/Behavioral: Negative.        Objective:   Physical Exam  Constitutional: He is oriented to person, place, and time. He appears well-developed and well-nourished. No distress.  In wheelchair with aide present  HENT:  Head: Normocephalic and atraumatic.  Eyes: Conjunctivae and EOM are normal. Pupils are equal, round, and reactive to light.  Neck: Normal range of motion. Neck supple.  Cardiovascular: Normal rate, regular rhythm and intact distal pulses.   Distant heart sounds  Pulmonary/Chest: Effort normal and breath sounds normal. No respiratory distress. He has no wheezes.  Abdominal: Soft. Bowel sounds are normal.  Musculoskeletal: He exhibits no edema and no tenderness.  Neurological: He is alert and oriented to person, place, and time.  Skin: Skin is warm and dry.  Excoriations to left shin area  Psychiatric: He has a normal mood and affect.          Assessment & Plan:  See separate problem list charting:

## 2013-12-08 NOTE — Progress Notes (Signed)
Case discussed with Dr. Schooler soon after the resident saw the patient.  We reviewed the resident's history and exam and pertinent patient test results.  I agree with the assessment, diagnosis, and plan of care documented in the resident's note. 

## 2013-12-08 NOTE — Assessment & Plan Note (Signed)
Having nocturnal leg cramps.  Willing to try repositioning with pillow under his knees, pre-bedtime leg stretches, and not tucking the bed linens in at the bottom of the bed. Sleeping in the recliner he does not have the symptoms. Will defer electrolyte testing as pt recently had lab 11/18/2013  BMET    Component Value Date/Time   NA 138 11/18/2013 1609   K 4.8 11/18/2013 1609   CL 102 11/18/2013 1609   CO2 25 11/18/2013 1609   GLUCOSE 120* 11/18/2013 1609   BUN 26* 11/18/2013 1609   CREATININE 1.10 11/18/2013 1609   CREATININE 1.24 11/15/2013 1757   CALCIUM 9.0 11/18/2013 1609   GFRNONAA 50* 11/15/2013 1757   GFRAA 58* 11/15/2013 1757

## 2013-12-09 ENCOUNTER — Ambulatory Visit: Payer: Medicare Other | Admitting: Cardiovascular Disease

## 2013-12-10 ENCOUNTER — Encounter: Payer: Self-pay | Admitting: Cardiovascular Disease

## 2013-12-10 ENCOUNTER — Ambulatory Visit (INDEPENDENT_AMBULATORY_CARE_PROVIDER_SITE_OTHER): Payer: Medicare Other | Admitting: Cardiovascular Disease

## 2013-12-10 VITALS — BP 116/78 | HR 74 | Ht 67.0 in | Wt 210.0 lb

## 2013-12-10 DIAGNOSIS — I350 Nonrheumatic aortic (valve) stenosis: Secondary | ICD-10-CM

## 2013-12-10 DIAGNOSIS — I359 Nonrheumatic aortic valve disorder, unspecified: Secondary | ICD-10-CM

## 2013-12-10 DIAGNOSIS — I4891 Unspecified atrial fibrillation: Secondary | ICD-10-CM

## 2013-12-10 DIAGNOSIS — I2510 Atherosclerotic heart disease of native coronary artery without angina pectoris: Secondary | ICD-10-CM

## 2013-12-10 DIAGNOSIS — I5022 Chronic systolic (congestive) heart failure: Secondary | ICD-10-CM

## 2013-12-10 NOTE — Progress Notes (Signed)
HPI:  Eugene [name] returns for followup evaluation. He is an 80 year old gentleman with aortic stenosis and coronary artery disease. He underwent balloon aortic valvuloplasty 2 years ago after he presented with syncope and was noted to have severe aortic stenosis. Around that same time he also underwent PCI with a bare-metal stent in his proximal LAD. His transvalvular gradients are again in the severe range, but he has been managed medically with stable symptoms.  He has mild dyspnea with exertion and this is essentially unchanged over time. He denies any recurrent episodes of lightheadedness or near syncope. He's had no chest pain or pressure. Since I have seen him last, he has had 3 mechanical falls. He is slowly recovering from this. He was hospitalized in December and went to short-term skilled nursing. He is now back home and has a regular caregiver who is with him today.  Outpatient Encounter Prescriptions as of 12/10/2013  Medication Sig  . acetaminophen (TYLENOL) 325 MG tablet Take 2 tablets (650 mg total) by mouth every 6 (six) hours as needed for mild pain (or Fever >/= 101).  Marland Kitchen amiodarone (PACERONE) 100 MG tablet Take 1 tablet (100 mg total) by mouth daily.  Marland Kitchen amLODipine (NORVASC) 5 MG tablet Take 5 mg by mouth daily.  Marland Kitchen aspirin EC 81 MG tablet Take 81 mg by mouth daily.  . benzonatate (TESSALON PERLES) 100 MG capsule Take 1 capsule (100 mg total) by mouth 3 (three) times daily as needed for cough.  . Calcium Carbonate-Vitamin D (CALCIUM + D PO) Take 1 tablet by mouth daily.   . Cyanocobalamin (VITAMIN B-12 IJ) Inject 1,000 mcg as directed every 7 (seven) days. Every tuesday  . docusate sodium (COLACE) 100 MG capsule Take 100 mg by mouth 2 (two) times daily.   . ferrous sulfate 325 (65 FE) MG tablet Take 325 mg by mouth daily with breakfast.  . furosemide (LASIX) 20 MG tablet Take 1 tablet (20 mg total) by mouth every morning.  Marland Kitchen levothyroxine (SYNTHROID, LEVOTHROID) 25 MCG tablet Take  25 mcg by mouth daily before breakfast.  . losartan (COZAAR) 50 MG tablet Take 50 mg by mouth daily.  Marland Kitchen loteprednol (LOTEMAX) 0.5 % ophthalmic suspension Place 1 drop into the right eye every morning.   Marland Kitchen oxybutynin (DITROPAN) 5 MG tablet Take 5 mg by mouth daily.  . pantoprazole (PROTONIX) 40 MG tablet Take 1 tablet (40 mg total) by mouth daily.  . pravastatin (PRAVACHOL) 40 MG tablet Take 1 tablet (40 mg total) by mouth daily.  Marland Kitchen spironolactone (ALDACTONE) 25 MG tablet Take 0.5 tablets (12.5 mg total) by mouth every other day.  . terazosin (HYTRIN) 1 MG capsule Take 1 capsule (1 mg total) by mouth at bedtime.  . zoster vaccine live, PF, (ZOSTAVAX) 40981 UNT/0.65ML injection Inject 19,400 Units into the skin once.  . zoster vaccine live, PF, (ZOSTAVAX) 19147 UNT/0.65ML injection Inject 19,400 Units into the skin once.  . [DISCONTINUED] oxybutynin (DITROPAN) 5 MG tablet Take 1 tablet (5 mg total) by mouth 2 (two) times daily.  . [DISCONTINUED] carvedilol (COREG) 3.125 MG tablet Take 1 tablet (3.125 mg total) by mouth 2 (two) times daily with a meal.    Allergies  Allergen Reactions  . Penicillins Swelling    REACTION: "turns red"    Past Medical History  Diagnosis Date  . Glaucoma     Right eye  . Diverticulosis     multiple hospital admissions for GI bleed, has an episode roughly every 6  months,, last  colonscopy in 2006 showed diverticulosis, was evaluated by Dr. Madilyn Fireman in the hospital in 2011 for lower GI bleed andd it  was suggested that the patient was not actively bleeding at that time and given his comorbid conditions colonoscopy was deferred. He is also chronically constipated  . Macular degeneration of right eye   . Allergic rhinitis   . CAD (coronary artery disease)     s/p PCI in 1991;  Cardiac catheterization 11/02/11: Ostial LAD 95% with aneurysmal dilatation after this, proximal circumflex 70% with a superior branch 30-40%, proximal RCA 75% and 90%, mid RCA 50% and 90% before  a PDA, then occluded.;    high risk for CABG;PCI 11/14/11: Bare-metal stent to the proximal LAD    . OSA on CPAP   . Hypertension   . Hyperlipidemia   . Chronic systolic heart failure     Echocardiogram 11/02/11: Moderate LVH, EF 30-35%, multiple wall motion abnormalities, critical aortic stenosis, AVA 0.6, mean gradient 45, mild MR, severe LAE  . Supraventricular tachycardia     2 syndromes-nonsustained atrial tachycardia//adenosine responsive diuretic positive reentry probably AV node reentry  . Aortic stenosis     s/p AV balloon valvuloplasty by Dr. Excell Seltzer 11/2011  . Ischemic cardiomyopathy   . H/O: GI bleed     recurrent  . Heart murmur   . CHF (congestive heart failure)   . Pneumonia 2012  . Hypothyroidism   . GERD (gastroesophageal reflux disease)   . Falls frequently     3 times in the past week/notes 09/11/2013  . Myocardial infarction 1991; 10/28/2011  . Exertional shortness of breath     "not much" (09/11/2013)  . Arthritis     "might have slight; have trouble w/my fingers" (09/11/2013)    ROS: Negative except as per HPI  BP 116/78  Pulse 74  Ht 5\' 7"  (1.702 m)  Wt 210 lb (95.255 kg)  BMI 32.88 kg/m2  PHYSICAL EXAM: Pt is alert and oriented, pleasant elderly gentleman in NAD HEENT: normal Neck: JVP - normal, carotids delayed with bilateral bruits Lungs: CTA bilaterally CV: RRR with grade 3/6 crescendo decrescendo murmur at the right upper sternal border Abd: soft, NT, Positive BS, no hepatomegaly Ext: 1+ bilateral pretibial edema Skin: warm/dry no rash  EKG:  Normal sinus rhythm 74 beats per minute, first degree AV block with PR interval 260 ms, left ventricular hypertrophy with QRS widening.  2-D echocardiogram 03/13/2013: Left ventricle: The cavity size was normal. Wall thickness was increased in a pattern of moderate LVH. Systolic function was normal. The estimated ejection fraction was in the range of 60% to 65%. Wall motion was normal; there were no regional  wall motion abnormalities. There was an increased relative contribution of atrial contraction to ventricular filling. Early diastolic septal annular tissue Doppler velocities Ea were abnormal. Doppler parameters are consistent with abnormal left ventricular relaxation (grade 1 diastolic dysfunction).  ------------------------------------------------------------ Aortic valve: Moderately thickened, severely calcified leaflets. Doppler: There was severe stenosis. Trivial regurgitation. VTI ratio of LVOT to aortic valve: 0.2. Valve area: 0.76cm^2(VTI). Indexed valve area: 0.38cm^2/m^2 (VTI). Peak velocity ratio of LVOT to aortic valve: 0.2. Valve area: 0.78cm^2 (Vmax). Indexed valve area: 0.39cm^2/m^2 (Vmax). Mean gradient: 50mm Hg (S). Peak gradient: 77mm Hg (S).  ------------------------------------------------------------ Mitral valve: Doppler: Trivial regurgitation.  ------------------------------------------------------------ Left atrium: The atrium was mildly dilated.  ------------------------------------------------------------ Right ventricle: The cavity size was normal. Systolic function was normal.  ------------------------------------------------------------ Pulmonic valve: Doppler: Mild regurgitation.  ------------------------------------------------------------ Tricuspid valve: Doppler:  Trivial regurgitation.  ------------------------------------------------------------ Right atrium: The atrium was mildly dilated.  ------------------------------------------------------------ Pericardium: There was no pericardial effusion.  ASSESSMENT AND PLAN: 1. Severe aortic stenosis. The patient has minimal cardiac symptoms and his exertional dyspnea is stable. Considering his comorbid conditions, will continue with close observation. I do think he would be reasonable candidate for TAVR if he were to develop progressive cardiac symptoms over time. I will see him back in 6 months  with an echocardiogram prior to that visit.  2. Paroxysmal atrial fibrillation/atrial tachycardia. The patient is on amiodarone at low dose. Will arrange thyroid studies and liver function tests for amiodarone monitoring when he comes back in followup.  3. Coronary artery disease, native vessel. He underwent stenting of the proximal LAD 2 years ago. He is having no ischemic symptoms at this time. He remains on aspirin 81 mg daily. He's had recurrent GI bleeding on Plavix and I think this should be avoided in the future if at all possible.  4. Hypertension. Blood pressure is well controlled on his current medical program which includes amlodipine, furosemide, losartan, and Aldactone.  Tonny BollmanMichael Nyia Tsao 12/10/2013 1:25 PM

## 2013-12-10 NOTE — Patient Instructions (Addendum)
Your physician has requested that you have an echocardiogram in 6 months before your office visit with Dr. [name]. Echocardiography is a painless test that uses sound waves to create images of your heart. It provides your doctor with information about the size and shape of your heart and how well your heart's chambers and valves are working. This procedure takes approximately one hour. There are no restrictions for this procedure.  Your physician recommends that you return for lab work in: 6 months before your office visit with Dr. [name]  Your physician wants you to follow-up in: 6 months with Dr. [name] after your echocardiogram and lab work. You will receive a reminder letter in the mail two months in advance. If you don't receive a letter, please call our office to schedule the follow-up appointment.  Your physician recommends that you continue on your current medications as directed. Please refer to the Current Medication list given to you today.

## 2013-12-14 ENCOUNTER — Telehealth: Payer: Self-pay | Admitting: Nurse Practitioner

## 2013-12-14 NOTE — Telephone Encounter (Signed)
Patient's caregiver, Tawny HoppingMike Kenny called to review patient's medications since during last ov on 3/5 he noticed some discrepancies.  Medication list has been updated.

## 2013-12-15 ENCOUNTER — Other Ambulatory Visit: Payer: Self-pay | Admitting: Internal Medicine

## 2013-12-20 ENCOUNTER — Other Ambulatory Visit: Payer: Self-pay | Admitting: Internal Medicine

## 2013-12-23 ENCOUNTER — Other Ambulatory Visit: Payer: Self-pay

## 2013-12-23 MED ORDER — FUROSEMIDE 20 MG PO TABS: 20.0000 mg | ORAL_TABLET | Freq: Every morning | ORAL | Status: DC

## 2013-12-24 ENCOUNTER — Other Ambulatory Visit: Payer: Self-pay

## 2013-12-24 MED ORDER — FUROSEMIDE 20 MG PO TABS: 20.0000 mg | ORAL_TABLET | Freq: Every morning | ORAL | Status: DC

## 2013-12-28 ENCOUNTER — Other Ambulatory Visit: Payer: Self-pay | Admitting: *Deleted

## 2013-12-28 MED ORDER — AMIODARONE HCL 100 MG PO TABS: 100.0000 mg | ORAL_TABLET | Freq: Every day | ORAL | Status: DC

## 2014-01-04 ENCOUNTER — Ambulatory Visit (INDEPENDENT_AMBULATORY_CARE_PROVIDER_SITE_OTHER): Payer: Medicare Other | Admitting: Internal Medicine

## 2014-01-04 ENCOUNTER — Encounter: Payer: Self-pay | Admitting: Internal Medicine

## 2014-01-04 VITALS — BP 90/62 | HR 82 | Temp 97.1°F | Ht 67.0 in | Wt 219.3 lb

## 2014-01-04 DIAGNOSIS — I10 Essential (primary) hypertension: Secondary | ICD-10-CM

## 2014-01-04 DIAGNOSIS — R269 Unspecified abnormalities of gait and mobility: Secondary | ICD-10-CM

## 2014-01-04 DIAGNOSIS — I2510 Atherosclerotic heart disease of native coronary artery without angina pectoris: Secondary | ICD-10-CM

## 2014-01-04 NOTE — Patient Instructions (Addendum)
We will have you stop taking the amlodipine. Your blood pressure is normal and we will see how it does in about 1-2 months after stopping this medicine.  If you have problems walking, with your breathing, or pain in your leg call the clinic sooner at [phone].  Come back in 1-2 months.  Fall Prevention and Home Safety Falls cause injuries and can affect all age groups. It is possible to prevent falls.  HOW TO PREVENT FALLS  Wear shoes with rubber soles that do not have an opening for your toes.  Keep the inside and outside of your house well lit.  Use night lights throughout your home.  Remove clutter from floors.  Clean up floor spills.  Remove throw rugs or fasten them to the floor with carpet tape.  Do not place electrical cords across pathways.  Put grab bars by your tub, shower, and toilet. Do not use towel bars as grab bars.  Put handrails on both sides of the stairway. Fix loose handrails.  Do not climb on stools or stepladders, if possible.  Do not wax your floors.  Repair uneven or unsafe sidewalks, walkways, or stairs.  Keep items you use a lot within reach.  Be aware of pets.  Keep emergency numbers next to the telephone.  Put smoke detectors in your home and near bedrooms. Ask your doctor what other things you can do to prevent falls. Document Released: 07/21/2009 Document Revised: 03/25/2012 Document Reviewed: 12/25/2011 [facility] Patient Information 2014 [employer], [place].

## 2014-01-04 NOTE — Assessment & Plan Note (Signed)
Patient has fallen 1-2 times in the last week and no LOC or head injury. No indication for imaging. Given fall checklist. He has supportive equipment with walker times 2 and wheelchair at home. He is working on PT exercises. Given checklist and information about preventing falls. Stopped amlodipine as concern for low BP causing instability.

## 2014-01-04 NOTE — Progress Notes (Signed)
Subjective:     Patient ID: Eugene [name], male   DOB: 02/25/1924, 80 y.o.   MRN: [id number]  HPI The patient is an 80 YO man coming in today for a bruise s/p mechanical fall at home. He also has systolic heart failure, aortic stenosis, recurrent falls, hypertension, hyperlipidemia. He was standing on his scale to check his weight when he stepped backwards he fell. He also states that they are getting a new scale as that one takes too long to weight him and it is hard to keep his balance for that amount of time. He does not think he feels dizzy but admits to being off balance at times. He has a walker for the main level and another for upstairs and a wheelchair for longer trips. He also has bars installed to help with balance in the halls which has helped him. He was having a bruise on his butt and thigh which was painful originally and swollen but has gone down and no longer is tender. He was taking tylenol for it but has not taken it since yesterday. He is not having chest pains, fevers, chills. He denies hitting his head or LOC with the fall. He is still exercising 2 times per day with the exercises he learned from PT.   Review of Systems  Constitutional: Positive for fatigue. Negative for fever, chills, diaphoresis, activity change, appetite change and unexpected weight change.  Respiratory: Positive for shortness of breath. Negative for cough, chest tightness and wheezing.        Stable  Cardiovascular: Negative for chest pain, palpitations and leg swelling.  Gastrointestinal: Negative for nausea, vomiting, diarrhea, constipation and abdominal distention.  Musculoskeletal: Positive for gait problem and myalgias. Negative for arthralgias.  Skin: Positive for color change.       Bruise on butt and thigh left side  Neurological: Negative for dizziness, tremors, seizures, syncope, facial asymmetry, speech difficulty, weakness, light-headedness, numbness and headaches.       Patient denies but seems  as though he has problem with balance       Objective:   Physical Exam  Constitutional: He is oriented to person, place, and time. He appears well-developed and well-nourished.  Overweight  HENT:  Head: Normocephalic and atraumatic.  Eyes: EOM are normal. Pupils are equal, round, and reactive to light.  Neck: Normal range of motion. Neck supple. No JVD present. No tracheal deviation present. No thyromegaly present.  Cardiovascular: Normal rate and regular rhythm.   Murmur of aortic stenosis, S2 preserved  Pulmonary/Chest: Effort normal and breath sounds normal. No respiratory distress. He has no wheezes. He has no rales. He exhibits no tenderness.  Abdominal: Bowel sounds are normal.  Genitourinary:  Patient denies exam of the bruise on his left buttock and thigh during today's exam. No tenderness to palpation of his trochanteric bursa region or over the hip itself.  Musculoskeletal: Normal range of motion. He exhibits no edema and no tenderness.  Lymphadenopathy:    He has no cervical adenopathy.  Neurological: He is alert and oriented to person, place, and time. No cranial nerve deficit.       Assessment/Plan:   1. Please see problem oriented charting.  2. Disposition - Patient will be seen back 1-2 months for BP check. Stop amlodipine as he is having low BP and problems with falls and unclear if hypotension is playing a role. He continues to follow with cardiology for his aortic stenosis. Given fall checklist and information at today's visit.

## 2014-01-04 NOTE — Assessment & Plan Note (Signed)
BP low today as well as at his cardiology appointment recently and will stop amlodipine and have him return in 1-2 months for BP recheck. Concern for low BP at home causing falls.

## 2014-01-07 NOTE — Progress Notes (Signed)
Case discussed with Dr. Kollar soon after the resident saw the patient.  We reviewed the resident's history and exam and pertinent patient test results.  I agree with the assessment, diagnosis, and plan of care documented in the resident's note. 

## 2014-01-08 ENCOUNTER — Other Ambulatory Visit: Payer: Self-pay | Admitting: Cardiovascular Disease

## 2014-01-18 ENCOUNTER — Telehealth: Payer: Self-pay | Admitting: *Deleted

## 2014-01-18 NOTE — Telephone Encounter (Signed)
PA to [facility] for patient amiodarone

## 2014-01-19 NOTE — Telephone Encounter (Signed)
[facility] approved amiodarone through 10/07/2014, [place] [id number]

## 2014-02-15 ENCOUNTER — Other Ambulatory Visit: Payer: Self-pay | Admitting: Internal Medicine

## 2014-03-03 ENCOUNTER — Other Ambulatory Visit: Payer: Self-pay

## 2014-03-03 MED ORDER — SPIRONOLACTONE 25 MG PO TABS: 25.0000 mg | ORAL_TABLET | Freq: Every day | ORAL | Status: DC

## 2014-03-09 ENCOUNTER — Other Ambulatory Visit: Payer: Self-pay | Admitting: Cardiovascular Disease

## 2014-03-14 ENCOUNTER — Other Ambulatory Visit: Payer: Self-pay | Admitting: Internal Medicine

## 2014-03-23 ENCOUNTER — Other Ambulatory Visit: Payer: Self-pay | Admitting: Internal Medicine

## 2014-04-07 ENCOUNTER — Emergency Department (HOSPITAL_COMMUNITY): Payer: Medicare Other

## 2014-04-07 ENCOUNTER — Other Ambulatory Visit: Payer: Self-pay

## 2014-04-07 ENCOUNTER — Ambulatory Visit: Payer: Medicare Other | Admitting: Internal Medicine

## 2014-04-07 ENCOUNTER — Observation Stay (HOSPITAL_COMMUNITY)
Admission: EM | Admit: 2014-04-07 | Discharge: 2014-04-08 | Disposition: A | Discharge status: Home / Self Care | Payer: Medicare Other | Attending: Internal Medicine | Admitting: Internal Medicine

## 2014-04-07 ENCOUNTER — Encounter (HOSPITAL_COMMUNITY): Payer: Self-pay | Admitting: Emergency Medicine

## 2014-04-07 DIAGNOSIS — Y92009 Unspecified place in unspecified non-institutional (private) residence as the place of occurrence of the external cause: Secondary | ICD-10-CM | POA: Insufficient documentation

## 2014-04-07 DIAGNOSIS — W19XXXD Unspecified fall, subsequent encounter: Secondary | ICD-10-CM

## 2014-04-07 DIAGNOSIS — I509 Heart failure, unspecified: Secondary | ICD-10-CM | POA: Insufficient documentation

## 2014-04-07 DIAGNOSIS — W010XXA Fall on same level from slipping, tripping and stumbling without subsequent striking against object, initial encounter: Secondary | ICD-10-CM | POA: Insufficient documentation

## 2014-04-07 DIAGNOSIS — I5042 Chronic combined systolic (congestive) and diastolic (congestive) heart failure: Secondary | ICD-10-CM | POA: Diagnosis present

## 2014-04-07 DIAGNOSIS — I359 Nonrheumatic aortic valve disorder, unspecified: Secondary | ICD-10-CM | POA: Insufficient documentation

## 2014-04-07 DIAGNOSIS — I498 Other specified cardiac arrhythmias: Secondary | ICD-10-CM | POA: Insufficient documentation

## 2014-04-07 DIAGNOSIS — R0989 Other specified symptoms and signs involving the circulatory and respiratory systems: Secondary | ICD-10-CM | POA: Insufficient documentation

## 2014-04-07 DIAGNOSIS — R32 Unspecified urinary incontinence: Secondary | ICD-10-CM | POA: Insufficient documentation

## 2014-04-07 DIAGNOSIS — I5033 Acute on chronic diastolic (congestive) heart failure: Secondary | ICD-10-CM

## 2014-04-07 DIAGNOSIS — R0602 Shortness of breath: Secondary | ICD-10-CM | POA: Insufficient documentation

## 2014-04-07 DIAGNOSIS — Z9181 History of falling: Secondary | ICD-10-CM | POA: Insufficient documentation

## 2014-04-07 DIAGNOSIS — I10 Essential (primary) hypertension: Secondary | ICD-10-CM | POA: Insufficient documentation

## 2014-04-07 DIAGNOSIS — I2589 Other forms of chronic ischemic heart disease: Secondary | ICD-10-CM | POA: Insufficient documentation

## 2014-04-07 DIAGNOSIS — R269 Unspecified abnormalities of gait and mobility: Secondary | ICD-10-CM | POA: Insufficient documentation

## 2014-04-07 DIAGNOSIS — I872 Venous insufficiency (chronic) (peripheral): Principal | ICD-10-CM | POA: Diagnosis present

## 2014-04-07 DIAGNOSIS — G4733 Obstructive sleep apnea (adult) (pediatric): Secondary | ICD-10-CM | POA: Diagnosis present

## 2014-04-07 DIAGNOSIS — E785 Hyperlipidemia, unspecified: Secondary | ICD-10-CM | POA: Insufficient documentation

## 2014-04-07 DIAGNOSIS — R600 Localized edema: Secondary | ICD-10-CM | POA: Diagnosis present

## 2014-04-07 DIAGNOSIS — R296 Repeated falls: Secondary | ICD-10-CM

## 2014-04-07 DIAGNOSIS — R609 Edema, unspecified: Secondary | ICD-10-CM | POA: Insufficient documentation

## 2014-04-07 DIAGNOSIS — E783 Hyperchylomicronemia: Secondary | ICD-10-CM | POA: Insufficient documentation

## 2014-04-07 DIAGNOSIS — I5032 Chronic diastolic (congestive) heart failure: Secondary | ICD-10-CM

## 2014-04-07 DIAGNOSIS — I4891 Unspecified atrial fibrillation: Secondary | ICD-10-CM | POA: Insufficient documentation

## 2014-04-07 DIAGNOSIS — I2510 Atherosclerotic heart disease of native coronary artery without angina pectoris: Secondary | ICD-10-CM | POA: Insufficient documentation

## 2014-04-07 DIAGNOSIS — R0609 Other forms of dyspnea: Secondary | ICD-10-CM | POA: Insufficient documentation

## 2014-04-07 LAB — COMPREHENSIVE METABOLIC PANEL
ALK PHOS: 97 U/L (ref 39–117)
ALT: 12 U/L (ref 0–53)
AST: 18 U/L (ref 0–37)
Albumin: 3.6 g/dL (ref 3.5–5.2)
Anion gap: 12 (ref 5–15)
BILIRUBIN TOTAL: 0.5 mg/dL (ref 0.3–1.2)
BUN: 24 mg/dL — ABNORMAL HIGH (ref 6–23)
CHLORIDE: 101 meq/L (ref 96–112)
CO2: 28 meq/L (ref 19–32)
Calcium: 9.4 mg/dL (ref 8.4–10.5)
Creatinine, Ser: 1.26 mg/dL (ref 0.50–1.35)
GFR calc Af Amer: 57 mL/min — ABNORMAL LOW (ref >90)
GFR calc non Af Amer: 49 mL/min — ABNORMAL LOW (ref >90)
Glucose, Bld: 104 mg/dL — ABNORMAL HIGH (ref 70–99)
Potassium: 4.5 mEq/L (ref 3.7–5.3)
Sodium: 141 mEq/L (ref 137–147)
Total Protein: 6.7 g/dL (ref 6.0–8.3)

## 2014-04-07 LAB — CBC WITH DIFFERENTIAL/PLATELET
Basophils Absolute: 0.1 10*3/uL (ref 0.0–0.1)
Basophils Relative: 1 % (ref 0–1)
EOS PCT: 3 % (ref 0–5)
Eosinophils Absolute: 0.2 10*3/uL (ref 0.0–0.7)
HEMATOCRIT: 45.7 % (ref 39.0–52.0)
Hemoglobin: 15.7 g/dL (ref 13.0–17.0)
Lymphocytes Relative: 10 % — ABNORMAL LOW (ref 12–46)
Lymphs Abs: 0.8 10*3/uL (ref 0.7–4.0)
MCH: 31.6 pg (ref 26.0–34.0)
MCHC: 34.4 g/dL (ref 30.0–36.0)
MCV: 92 fL (ref 78.0–100.0)
MONOS PCT: 9 % (ref 3–12)
Monocytes Absolute: 0.7 10*3/uL (ref 0.1–1.0)
NEUTROS ABS: 5.9 10*3/uL (ref 1.7–7.7)
Neutrophils Relative %: 77 % (ref 43–77)
PLATELETS: UNDETERMINED 10*3/uL (ref 150–400)
RBC: 4.97 MIL/uL (ref 4.22–5.81)
RDW: 13.9 % (ref 11.5–15.5)
WBC: 7.7 10*3/uL (ref 4.0–10.5)

## 2014-04-07 LAB — URINALYSIS, ROUTINE W REFLEX MICROSCOPIC
BILIRUBIN URINE: NEGATIVE
GLUCOSE, UA: NEGATIVE mg/dL
Hgb urine dipstick: NEGATIVE
KETONES UR: NEGATIVE mg/dL
Nitrite: NEGATIVE
PROTEIN: NEGATIVE mg/dL
Specific Gravity, Urine: 1.016 (ref 1.005–1.030)
Urobilinogen, UA: 1 mg/dL (ref 0.0–1.0)
pH: 6.5 (ref 5.0–8.0)

## 2014-04-07 LAB — TROPONIN I
Troponin I: <0.3 ng/mL (ref <0.30)
Troponin I: <0.3 ng/mL (ref <0.30)

## 2014-04-07 LAB — URINE MICROSCOPIC-ADD ON

## 2014-04-07 LAB — PRO B NATRIURETIC PEPTIDE: Pro B Natriuretic peptide (BNP): 1533 pg/mL — ABNORMAL HIGH (ref 0–450)

## 2014-04-07 LAB — TSH: TSH: 4 u[IU]/mL (ref 0.350–4.500)

## 2014-04-07 MED ORDER — SPIRONOLACTONE 12.5 MG HALF TABLET: 12.5000 mg | ORAL_TABLET | ORAL | Status: DC

## 2014-04-07 MED ORDER — SIMVASTATIN 20 MG PO TABS
20.0000 mg | ORAL_TABLET | Freq: Every day | ORAL | Status: DC
  Filled 2014-04-07: qty 1

## 2014-04-07 MED ORDER — PANTOPRAZOLE SODIUM 40 MG PO TBEC
40.0000 mg | DELAYED_RELEASE_TABLET | Freq: Every day | ORAL | Status: DC
  Administered 2014-04-08: 40 mg via ORAL
  Filled 2014-04-07: qty 1

## 2014-04-07 MED ORDER — LEVOTHYROXINE SODIUM 25 MCG PO TABS
25.0000 ug | ORAL_TABLET | Freq: Every day | ORAL | Status: DC
  Administered 2014-04-08: 25 ug via ORAL
  Filled 2014-04-07 (×2): qty 1

## 2014-04-07 MED ORDER — ASPIRIN EC 81 MG PO TBEC
81.0000 mg | DELAYED_RELEASE_TABLET | Freq: Every day | ORAL | Status: DC
  Administered 2014-04-08: 81 mg via ORAL
  Filled 2014-04-07: qty 1

## 2014-04-07 MED ORDER — ENOXAPARIN SODIUM 40 MG/0.4ML ~~LOC~~ SOLN
40.0000 mg | SUBCUTANEOUS | Status: DC
  Administered 2014-04-07: 40 mg via SUBCUTANEOUS
  Filled 2014-04-07 (×2): qty 0.4

## 2014-04-07 MED ORDER — CHOLECALCIFEROL 10 MCG (400 UNIT) PO TABS
800.0000 [IU] | ORAL_TABLET | Freq: Every day | ORAL | Status: DC
  Administered 2014-04-08: 800 [IU] via ORAL
  Filled 2014-04-07 (×2): qty 2

## 2014-04-07 MED ORDER — CALCIUM CARBONATE 1250 (500 CA) MG PO TABS
1.0000 | ORAL_TABLET | Freq: Every day | ORAL | Status: DC
  Administered 2014-04-08: 500 mg via ORAL
  Filled 2014-04-07 (×2): qty 1

## 2014-04-07 MED ORDER — ACETAMINOPHEN 650 MG RE SUPP: 650.0000 mg | Freq: Four times a day (QID) | RECTAL | Status: DC | PRN

## 2014-04-07 MED ORDER — ACETAMINOPHEN 325 MG PO TABS
650.0000 mg | ORAL_TABLET | Freq: Four times a day (QID) | ORAL | Status: DC | PRN
  Administered 2014-04-08: 650 mg via ORAL
  Filled 2014-04-07: qty 2

## 2014-04-07 MED ORDER — FUROSEMIDE 10 MG/ML IJ SOLN
80.0000 mg | Freq: Once | INTRAMUSCULAR | Status: AC
Start: 2014-04-07 — End: 2014-04-07
  Administered 2014-04-07: 80 mg via INTRAVENOUS
  Filled 2014-04-07: qty 8

## 2014-04-07 MED ORDER — OXYBUTYNIN CHLORIDE 5 MG PO TABS
5.0000 mg | ORAL_TABLET | Freq: Every day | ORAL | Status: DC
  Administered 2014-04-08: 5 mg via ORAL
  Filled 2014-04-07: qty 1

## 2014-04-07 MED ORDER — CALCIUM CARB-CHOLECALCIFEROL 600-800 MG-UNIT PO TABS: 1.0000 | ORAL_TABLET | Freq: Every day | ORAL | Status: DC

## 2014-04-07 MED ORDER — LOSARTAN POTASSIUM 50 MG PO TABS
50.0000 mg | ORAL_TABLET | Freq: Every day | ORAL | Status: DC
  Administered 2014-04-08: 50 mg via ORAL
  Filled 2014-04-07: qty 1

## 2014-04-07 MED ORDER — TERAZOSIN HCL 1 MG PO CAPS
1.0000 mg | ORAL_CAPSULE | Freq: Every day | ORAL | Status: DC
  Administered 2014-04-07: 1 mg via ORAL
  Filled 2014-04-07 (×2): qty 1

## 2014-04-07 NOTE — ED Notes (Signed)
EMS vitals BP-158/94, HR-80, RR-20, SpO2-94% room, CBG-105.

## 2014-04-07 NOTE — H&P (Signed)
Date: 04/07/2014               Patient Name:  Eugene [name] MRN: [id number]  DOB: 1924/07/02 Age / Sex: 80 y.o., male   PCP: Earl Lagos, MD         Medical Service: Internal Medicine Teaching Service         Attending Physician: Dr. Rocco Serene, MD    First Contact: Dr. Danella Penton Pager: [phone]  Second Contact: Dr. Virgina Organ Pager: [phone]       After Hours (After 5p/  First Contact Pager: [phone]  weekends / holidays): Second Contact Pager: [phone]   Chief Complaint: Fall  History of Present Illness: Eugene [name] is a 80 y.o. male w/ PMHx of hyperlipidemia, chronic systolic heart failure, chronic venous insufficiency, bradycardia, SVT, CAD, aortic stenosis, OSA, urinary incontinence and atherosclerotic PVD with ulercation who presents to the ED by EMS after a fall. The patient stated he was walking to the bathroom and tripped over his own shoes which he was wearing. He states the shoes are too big for him and he has ordered new ones. This problem has happened to him previously. When the patient fell, he fell sideways and hit his head. He denies any dizziness before the fall and denies any LOC before or after the fall. EMS was called and the patient was brought to [facility] ED. Pt also admits to SOB and DOE which is stable since January. He has noticed increased swelling, redness and water "secreting" from both of his legs in the past few weeks. He also admits to a 20 pound weight gain in the last two months. Patient states he has been taking all of his home medications as prescribed.   Meds: No current facility-administered medications for this encounter.    Allergies: Allergies as of 04/07/2014 - Review Complete 04/07/2014  Allergen Reaction Noted  . Penicillins Swelling and Other (See Comments)    Past Medical History  Diagnosis Date  . Glaucoma     Right eye  . Diverticulosis     multiple hospital admissions for GI bleed, has an episode roughly every 6 months,, last   colonscopy in 2006 showed diverticulosis, was evaluated by Dr. Madilyn Fireman in the hospital in 2011 for lower GI bleed andd it  was suggested that the patient was not actively bleeding at that time and given his comorbid conditions colonoscopy was deferred. He is also chronically constipated  . Macular degeneration of right eye   . Allergic rhinitis   . CAD (coronary artery disease)     s/p PCI in 1991;  Cardiac catheterization 11/02/11: Ostial LAD 95% with aneurysmal dilatation after this, proximal circumflex 70% with a superior branch 30-40%, proximal RCA 75% and 90%, mid RCA 50% and 90% before a PDA, then occluded.;    high risk for CABG;PCI 11/14/11: Bare-metal stent to the proximal LAD    . OSA on CPAP   . Hypertension   . Hyperlipidemia   . Chronic systolic heart failure     Echocardiogram 11/02/11: Moderate LVH, EF 30-35%, multiple wall motion abnormalities, critical aortic stenosis, AVA 0.6, mean gradient 45, mild MR, severe LAE  . Supraventricular tachycardia     2 syndromes-nonsustained atrial tachycardia//adenosine responsive diuretic positive reentry probably AV node reentry  . Aortic stenosis     s/p AV balloon valvuloplasty by Dr. Excell Seltzer 11/2011  . Ischemic cardiomyopathy   . H/O: GI bleed     recurrent  . Heart murmur   .  CHF (congestive heart failure)   . Pneumonia 2012  . Hypothyroidism   . GERD (gastroesophageal reflux disease)   . Falls frequently     3 times in the past week/notes 09/11/2013  . Myocardial infarction 1991; 10/28/2011  . Exertional shortness of breath     "not much" (09/11/2013)  . Arthritis     "might have slight; have trouble w/my fingers" (09/11/2013)   Past Surgical History  Procedure Laterality Date  . Glaucoma surgery      Implantation of Baerveldt glaucoma device lant with scleral reinforcement using tutoplast tissue graft right eye. [Other]  . Coronary angioplasty with stent placement  Jan. 23, 2013  . Corneal transplant Right   . Cardiac valve  replacement  11/30/2011    aortic valve  . Angioplasty Left 03/30/2013    tibial  . Tonsillectomy  1946  . Cataract extraction w/ intraocular lens  implant, bilateral Bilateral    Family History  Problem Relation Age of Onset  . Cancer Mother   . Cancer Father   . Heart disease Sister     Heart disease before age 40   History   Social History  . Marital Status: Widowed    Spouse Name: N/A    Number of Children: N/A  . Years of Education: N/A   Occupational History  . Not on file.   Social History Main Topics  . Smoking status: Former Smoker -- 3.00 packs/day for 21 years    Types: Cigarettes    Quit date: 10/08/1958  . Smokeless tobacco: Never Used  . Alcohol Use: 0.0 oz/week  . Drug Use: No  . Sexual Activity: No   Other Topics Concern  . Not on file   Social History Narrative   Lives at the [facility]. His pastor is his HCPOA  Eugene [name] [facility] [place]) [phone]). His daugther has been removed because she use $500k from his estate without his approval.  He also has a close friend who takes him for his doctor's appointments and is with him during the hospital admission. 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 Never smoked or drank alcohol in past 40 years. No illicit drug use. Has Medicare.    Review of Systems: General: Positive weight gain. Denies fever, chills, fatigue.  Respiratory: Positive SOB and DOE. Denies cough, chest tightness, and wheezing.   Cardiovascular: Denies chest pain and palpitations.  Gastrointestinal: Denies nausea, vomiting, abdominal pain, diarrhea, constipation, blood in stool and abdominal distention. Genitourinary: Positive hesitancy. Denies dysuria, urgency, hematuria. Musculoskeletal: Positive back pain. Positive lower extremity edema bilaterally. Skin: Positive erythema and blistering on lower extremities with secretion of fluid.   Neurological: Denies dizziness, syncope,  weakness, lightheadedness. Endocrine: Denies hot or cold intolerance, polyuria, and polydipsia.  Psychiatric/Behavioral: Denies mood changes, confusion, nervousness, sleep disturbance and agitation.   Physical Exam: Blood pressure 143/76, pulse 72, temperature 98.1 F (36.7 C), temperature source Oral, resp. rate 20, height 5\' 7"  (1.702 m), weight 208 lb (94.348 kg), SpO2 100.00%. Filed Vitals:   04/07/14 1730 04/07/14 1800 04/07/14 1830 04/07/14 1929  BP: 139/78 112/68 136/77 143/76  Pulse: 75 82 79 72  Temp:    98.1 F (36.7 C)  TempSrc:    Oral  Resp: 15 17 20 20   Height:    5\' 7"  (1.702 m)  Weight:    208 lb (94.348 kg)  SpO2: 97% 94% 96% 100%   General: Vital signs reviewed. Patient  is a well-developed and well-nourished, in no acute distress and cooperative with exam.  Head: Normocephalic and atraumatic. No obvious edema, bruising or trauma. Neck: Supple, trachea midline. Cardiovascular: RRR, muffled heart sounds, 3/6 systolic ejection murmur heard loudest in aortic area. Pulmonary/Chest: Air entry equal bilaterally, mild expiratory wheezes in lower lung fields bilaterally. No rales, or rhonchi. Abdominal: Soft, non-tender, non-distended, BS +, no masses or guarding present.  Extremities: 3-4+ pitting edema in lower extremities bilaterally extending to knees. Warm and erythematous lower extremities with blistering with clear secretions extending from lower extremities to mid calf and shins bilaterally. Pulses symmetric and intact bilaterally. No cyanosis or clubbing. Neurological: A&O x3, Strength is normal and symmetric bilaterally, cranial nerve II-XII are grossly intact, no focal motor deficit. Psychiatric: Normal mood and affect. speech and behavior is normal. Cognition and memory are normal.    Lab results: Basic Metabolic Panel:  Recent Labs  16/10/96 1544  NA 141  K 4.5  CL 101  CO2 28  GLUCOSE 104*  BUN 24*  CREATININE 1.26  CALCIUM 9.4   Liver Function  Tests:  Recent Labs  04/07/14 1544  AST 18  ALT 12  ALKPHOS 97  BILITOT 0.5  PROT 6.7  ALBUMIN 3.6   No results found for this basename: LIPASE, AMYLASE,  in the last 72 hours No results found for this basename: AMMONIA,  in the last 72 hours CBC:  Recent Labs  04/07/14 1544  WBC 7.7  NEUTROABS 5.9  HGB 15.7  HCT 45.7  MCV 92.0  PLT PLATELET CLUMPS NOTED ON SMEAR, UNABLE TO ESTIMATE   Cardiac Enzymes:  Recent Labs  04/07/14 1544  TROPONINI <0.30   BNP:  Recent Labs  04/07/14 1544  PROBNP 1533.0*   D-Dimer: No results found for this basename: DDIMER,  in the last 72 hours CBG: No results found for this basename: GLUCAP,  in the last 72 hours Hemoglobin A1C: No results found for this basename: HGBA1C,  in the last 72 hours Fasting Lipid Panel: No results found for this basename: CHOL, HDL, LDLCALC, TRIG, CHOLHDL, LDLDIRECT,  in the last 72 hours Thyroid Function Tests:  Recent Labs  04/07/14 1828  TSH 4.000   Anemia Panel: No results found for this basename: VITAMINB12, FOLATE, FERRITIN, TIBC, IRON, RETICCTPCT,  in the last 72 hours Coagulation: No results found for this basename: LABPROT, INR,  in the last 72 hours Urine Drug Screen: Drugs of Abuse     Component Value Date/Time   LABOPIA NONE DETECTED 11/23/2010 0646   COCAINSCRNUR NONE DETECTED 11/23/2010 0646   LABBENZ NONE DETECTED 11/23/2010 0646   AMPHETMU NONE DETECTED 11/23/2010 0646   THCU NONE DETECTED 11/23/2010 0646   LABBARB  Value: NONE DETECTED        DRUG SCREEN FOR MEDICAL PURPOSES ONLY.  IF CONFIRMATION IS NEEDED FOR ANY PURPOSE, NOTIFY LAB WITHIN 5 DAYS.        LOWEST DETECTABLE LIMITS FOR URINE DRUG SCREEN Drug Class       Cutoff (ng/mL) Amphetamine      1000 Barbiturate      200 Benzodiazepine   200 Tricyclics       300 Opiates          300 Cocaine          300 THC              50 11/23/2010 0646    Alcohol Level: No results found for this basename: ETH,  in the last  72  hours Urinalysis:  Recent Labs  04/07/14 1713  COLORURINE YELLOW  LABSPEC 1.016  PHURINE 6.5  GLUCOSEU NEGATIVE  HGBUR NEGATIVE  BILIRUBINUR NEGATIVE  KETONESUR NEGATIVE  PROTEINUR NEGATIVE  UROBILINOGEN 1.0  NITRITE NEGATIVE  LEUKOCYTESUR SMALL*    Imaging results:  Dg Chest 2 View  04/07/2014   CLINICAL DATA:  Lower leg edema, former smoking history  EXAM: CHEST  2 VIEW  COMPARISON:  Chest x-ray of 11/15/2013  FINDINGS: The lungs remain somewhat hyper aerated. There is moderate cardiomegaly present. No infiltrate or effusion is seen, and there is no present evidence of pulmonary vascular congestion. The bones are osteopenic and there are degenerative changes throughout the thoracic spine. There is an apparent old mild partial compression deformity of a mid thoracic vertebral body noted. Deformity of the proximal right humerus most likely is due to prior fracture with healing.  IMPRESSION: Moderate cardiomegaly.  Hyper aeration.  No active lung disease.   Electronically Signed   By: Dwyane DeePaul  Barry M.D.   On: 04/07/2014 17:05   Ct Head Wo Contrast  04/07/2014   CLINICAL DATA:  Fall. Hit back of head on a wall. No loss of consciousness.  EXAM: CT HEAD WITHOUT CONTRAST  CT CERVICAL SPINE WITHOUT CONTRAST  TECHNIQUE: Multidetector CT imaging of the head and cervical spine was performed following the standard protocol without intravenous contrast. Multiplanar CT image reconstructions of the cervical spine were also generated.  COMPARISON:  CT head and cervical spine 09/11/2013.  FINDINGS: CT HEAD FINDINGS  No acute cortical infarct, hemorrhage, or mass lesion is present. Mild generalized atrophy and white matter disease is stable. The ventricles are stable size. No significant extra-axial fluid collection is present.  Right paramedian posterior scalp soft tissue swelling/ hemorrhage is present without an underlying fracture. The paranasal sinuses and mastoid air cells are clear. Postoperative changes  noted in the right globe.  CT CERVICAL SPINE FINDINGS  The cervical spine is imaged from the skullbase through T2-3. Slight anterolisthesis is evident at C3-4 and C4-5. Chronic endplate and facet degenerative changes are stable. No acute fracture or traumatic subluxation is evident.  Edema or atelectasis is present at the lung apices. Atherosclerotic calcifications are noted at the aortic arch and carotid bifurcations bilaterally.  IMPRESSION: 1. Soft swelling/hematoma along the right posteromedial scalp without an underlying fracture. 2. Stable atrophy and white matter disease. 3. Moderate spondylosis of the cervical spine without an underlying fracture.   Electronically Signed   By: Gennette Pachris  Mattern M.D.   On: 04/07/2014 16:47   Ct Cervical Spine Wo Contrast  04/07/2014   CLINICAL DATA:  Fall. Hit back of head on a wall. No loss of consciousness.  EXAM: CT HEAD WITHOUT CONTRAST  CT CERVICAL SPINE WITHOUT CONTRAST  TECHNIQUE: Multidetector CT imaging of the head and cervical spine was performed following the standard protocol without intravenous contrast. Multiplanar CT image reconstructions of the cervical spine were also generated.  COMPARISON:  CT head and cervical spine 09/11/2013.  FINDINGS: CT HEAD FINDINGS  No acute cortical infarct, hemorrhage, or mass lesion is present. Mild generalized atrophy and white matter disease is stable. The ventricles are stable size. No significant extra-axial fluid collection is present.  Right paramedian posterior scalp soft tissue swelling/ hemorrhage is present without an underlying fracture. The paranasal sinuses and mastoid air cells are clear. Postoperative changes noted in the right globe.  CT CERVICAL SPINE FINDINGS  The cervical spine is imaged from the skullbase through T2-3. Slight anterolisthesis  is evident at C3-4 and C4-5. Chronic endplate and facet degenerative changes are stable. No acute fracture or traumatic subluxation is evident.  Edema or atelectasis is  present at the lung apices. Atherosclerotic calcifications are noted at the aortic arch and carotid bifurcations bilaterally.  IMPRESSION: 1. Soft swelling/hematoma along the right posteromedial scalp without an underlying fracture. 2. Stable atrophy and white matter disease. 3. Moderate spondylosis of the cervical spine without an underlying fracture.   Electronically Signed   By: Gennette Pachris  Mattern M.D.   On: 04/07/2014 16:47    Other results: EKG: normal EKG, normal sinus rhythm, unchanged from previous tracings.  Assessment & Plan by Problem: - Acute on chronic CHF- Pt presents with stable SOB, DOE, worsening 3-4+ pitting lower extremity edema bilaterally with erythema, warmth and blistering and weight gain. History of CHF, previous echo in 6/14 showed grade 1 diastolic dysfunction, severe aortic stenosis s/p balloon valvuloplasty, and HTN. CXR shows moderate cardiomegaly, hyperaeration. No active lung disease. At home, Pt is on amiodarone 200 mg daily, ASA 81 mg, lasix 20 mg daily, losartan 50 mg daily, pravastatin 40 mg daily, spironolactone 12.5 mg every other day. Worsening CHF may be due to an ischemic event. Troponins have been ordered. 2 negative troponins. -ASA 81 mg, losartan 50 mg daily, simvastatin 20 mg daily, spironolactone 12.5 mg every other day -lasix 80 mg x1. Pt has diuresed 2L. -amiodarone 200 mg daily. Pt has been on 200 mg for a number of years, but the most recent prescription by Dr. [name] was 100 mg. We will place on 200 mg for now. -2D echocardiogram -oxygen 2L [place] -daily weights -I's/O's  -Fall- Pt presented after a fall from losing his balance. Pt has a history of HTN, aortic stenosis, and HLD. CT head w/o contrast and CT cervical spine showed soft swelling/hematoma along the right posteromedial scalp without an underlying fracture. Stable atrophy and white matter disease. Moderate spondylosis of the cervical spine without an underlying fracture. At home, pt is on ASA,  losartan, and pravastatin. Pt is AAOx3. No focal deficits. CN II-XII grossly intact. - pt on bed rest - PT consult - Continue ASA 81 mg, simvastatin 20 mg, and losartan  -Aortic Stenosis- s/p AV balloon valvuloplasty 2/13. Currently asymptomatic. Home meds include ASA, furosemide, losartan, and amiodarone. Pt has history of GI bleed after being on Plavix for 1 month and therefore not properly anticoagulated.  -Received 80 mg Lasix in the ED. -Continue ASA, amiodarone and losartan -2D echocardiogram  -CAD- s/p cardiac catheterization with bare metal stent placement in proximal LAD 2/13. Pt denies any chest pain, chest tightness or chest pressure. Takes ASA, losartan, pravastatin and spironolactone at home.  -troponins negative x 2 -continue amiodarone, ASA, losartan, pravastatin, and spironolactone  -Paroxysmal Atrial Fibrillation- Currently NSR at 78 bpm with 1st degree AV block. Unchanged from previous EKGs.  -on amiodarone 200 mg daily.  -Atherosclerotic PVD with ulceration- worsening 3-4+ pitting lower extremity edema bilaterally with erythema, warmth and blistering. Likely worsened due to CHF exacerbation. Pts leg could look suspicious for cellulits. However, the erythema has a distribution like PVD and stasis. Nontender, Will monitor that erythema does not spread past mid calf. We also considered DVT as a cause of his LEE, but pt has edema bilaterally and his calves are nontender.  -monitor. Expect improvement with Lasix and resolution of CHF exacerbation.  -OSA- history of OSA. Patient on CPAP at night.  -Hypothyroidism- history of hypothyroidism. Consider amiodarone as a possible cause. Pt  has TSH of 4.0 on admission. Pt takes synthroid 25 mcg at home. -continue synthroid 25 mcg daily.  Urinary incontinence- Pt on terazosin 1 mg daily and oxybutynin 5 mg daily.  -DVT ppx- Lovenox 40 mg SQ daily   Dispo: Disposition is deferred at this time, awaiting improvement of current medical  problems. Anticipated discharge in approximately 2-3 day(s).   The patient does have a current PCP (Earl Lagos, MD) and does need an [facility] follow-up appointment after discharge.  The patient does not have transportation limitations that hinder transportation to clinic appointments.  Signed: Jill Alexanders, DO PGY-1 Internal Medicine Resident Pager # [phone] 04/08/2014 2:01 AM

## 2014-04-07 NOTE — ED Notes (Signed)
80 yo leaving home for PCP for peripheral edema. Pt tripped and fell over his boot. Fell back hit head on the wall. Small hematoma no bruising mild swelling. Denies LOC. No blood thinners. No Vision deficits N/V/D. Soreness to bottom has resolved. Left and right trapezius muscle pain. Passed EMS spine test.

## 2014-04-07 NOTE — ED Provider Notes (Signed)
CSN: [id number]     Arrival date & time 04/07/14  1516 History   First MD Initiated Contact with Patient 04/07/14 1521     Chief Complaint  Patient presents with  . Fall     (Consider location/radiation/quality/duration/timing/severity/associated sxs/prior Treatment) HPI Comments: Presents to the ER for evaluation after a fall. Patient reports that he tripped in his home and lost his balance. He does to cause, throwing him backwards. He did have the back of his head but was not knocked out. He is complaining of slight headache and pain at the back of the neck on both sides, going into the shoulder area. Denies mid and lower back pain.  Patient was getting ready to go to his doctor when he fell. Patient is treated for congestive heart failure with Lasix. He has noticed progressively worsening swelling of both lower extremities. He does report dyspnea on exertion, but this has not significantly changed over his baseline since he had a heart valve replacement. He denies any chest pain associated with the symptoms.  Patient is a 80 y.o. male presenting with fall.  Fall Associated symptoms include headaches and shortness of breath. Pertinent negatives include no chest pain.    Past Medical History  Diagnosis Date  . Glaucoma     Right eye  . Diverticulosis     multiple hospital admissions for GI bleed, has an episode roughly every 6 months,, last  colonscopy in 2006 showed diverticulosis, was evaluated by Dr. Madilyn Fireman in the hospital in 2011 for lower GI bleed andd it  was suggested that the patient was not actively bleeding at that time and given his comorbid conditions colonoscopy was deferred. He is also chronically constipated  . Macular degeneration of right eye   . Allergic rhinitis   . CAD (coronary artery disease)     s/p PCI in 1991;  Cardiac catheterization 11/02/11: Ostial LAD 95% with aneurysmal dilatation after this, proximal circumflex 70% with a superior branch 30-40%, proximal RCA  75% and 90%, mid RCA 50% and 90% before a PDA, then occluded.;    high risk for CABG;PCI 11/14/11: Bare-metal stent to the proximal LAD    . OSA on CPAP   . Hypertension   . Hyperlipidemia   . Chronic systolic heart failure     Echocardiogram 11/02/11: Moderate LVH, EF 30-35%, multiple wall motion abnormalities, critical aortic stenosis, AVA 0.6, mean gradient 45, mild MR, severe LAE  . Supraventricular tachycardia     2 syndromes-nonsustained atrial tachycardia//adenosine responsive diuretic positive reentry probably AV node reentry  . Aortic stenosis     s/p AV balloon valvuloplasty by Dr. Excell Seltzer 11/2011  . Ischemic cardiomyopathy   . H/O: GI bleed     recurrent  . Heart murmur   . CHF (congestive heart failure)   . Pneumonia 2012  . Hypothyroidism   . GERD (gastroesophageal reflux disease)   . Falls frequently     3 times in the past week/notes 09/11/2013  . Myocardial infarction 1991; 10/28/2011  . Exertional shortness of breath     "not much" (09/11/2013)  . Arthritis     "might have slight; have trouble w/my fingers" (09/11/2013)   Past Surgical History  Procedure Laterality Date  . Glaucoma surgery      Implantation of Baerveldt glaucoma device lant with scleral reinforcement using tutoplast tissue graft right eye. [Other]  . Coronary angioplasty with stent placement  Jan. 23, 2013  . Corneal transplant Right   . Cardiac valve replacement  11/30/2011    aortic valve  . Angioplasty Left 03/30/2013    tibial  . Tonsillectomy  1946  . Cataract extraction w/ intraocular lens  implant, bilateral Bilateral    Family History  Problem Relation Age of Onset  . Cancer Mother   . Cancer Father   . Heart disease Sister     Heart disease before age 78   History  Substance Use Topics  . Smoking status: Former Smoker -- 3.00 packs/day for 21 years    Types: Cigarettes    Quit date: 10/08/1958  . Smokeless tobacco: Never Used  . Alcohol Use: 0.0 oz/week    Review of Systems    Respiratory: Positive for shortness of breath.   Cardiovascular: Positive for leg swelling. Negative for chest pain.  Musculoskeletal: Positive for neck pain.  Neurological: Positive for headaches.  All other systems reviewed and are negative.     Allergies  Penicillins  Home Medications   Prior to Admission medications   Medication Sig Start Date End Date Taking? Authorizing Provider  amiodarone (PACERONE) 200 MG tablet Take 200 mg by mouth daily.   Yes Historical Provider, MD  aspirin EC 81 MG tablet Take 81 mg by mouth daily.   Yes Historical Provider, MD  benzonatate (TESSALON PERLES) 100 MG capsule Take 1 capsule (100 mg total) by mouth 3 (three) times daily as needed for cough. 09/08/13 09/08/14 Yes Genelle GatherKathryn F Glenn, MD  Calcium Carb-Cholecalciferol (CALCIUM 600+D) 600-800 MG-UNIT TABS Take 1 tablet by mouth daily.   Yes Historical Provider, MD  ferrous sulfate 325 (65 FE) MG tablet Take 325 mg by mouth daily with breakfast.   Yes Historical Provider, MD  furosemide (LASIX) 20 MG tablet Take 1 tablet (20 mg total) by mouth every morning. 12/24/13  Yes Rollene RotundaJames Hochrein, MD  levothyroxine (SYNTHROID, LEVOTHROID) 25 MCG tablet Take 25 mcg by mouth daily before breakfast.   Yes Historical Provider, MD  losartan (COZAAR) 50 MG tablet Take 50 mg by mouth daily.   Yes Historical Provider, MD  oxybutynin (DITROPAN) 5 MG tablet Take 5 mg by mouth daily. 11/11/13  Yes Alejandro Paya, DO  pantoprazole (PROTONIX) 40 MG tablet Take 40 mg by mouth daily.   Yes Historical Provider, MD  pravastatin (PRAVACHOL) 40 MG tablet Take 1 tablet (40 mg total) by mouth daily. 11/18/13  Yes Windell Hummingbirdachel Chikowski, MD  spironolactone (ALDACTONE) 25 MG tablet Take 12.5 mg by mouth every other day.   Yes Historical Provider, MD  terazosin (HYTRIN) 1 MG capsule Take 1 mg by mouth at bedtime.   Yes Historical Provider, MD   BP 112/68  Pulse 82  Temp(Src) 98.3 F (36.8 C) (Oral)  Resp 17  SpO2 94% Physical Exam   Constitutional: He is oriented to person, place, and time. He appears well-developed and well-nourished. No distress.  HENT:  Head: Normocephalic. Head is with contusion.    Right Ear: Hearing normal.  Left Ear: Hearing normal.  Nose: Nose normal.  Mouth/Throat: Oropharynx is clear and moist and mucous membranes are normal.  Eyes: Conjunctivae and EOM are normal. Pupils are equal, round, and reactive to light.  Neck: Normal range of motion. Neck supple. Muscular tenderness (bilateral paraspinal) present. No spinous process tenderness present.  Cardiovascular: Regular rhythm, S1 normal and S2 normal.  Exam reveals no gallop and no friction rub.   No murmur heard. Pulmonary/Chest: Effort normal and breath sounds normal. No respiratory distress. He exhibits no tenderness.  Abdominal: Soft. Normal appearance and bowel sounds  are normal. There is no hepatosplenomegaly. There is no tenderness. There is no rebound, no guarding, no tenderness at McBurney's point and negative Murphy's sign. No hernia.  Musculoskeletal: Normal range of motion. He exhibits edema (3-4+ pitting edema bilateral lower extremities).  Neurological: He is alert and oriented to person, place, and time. He has normal strength. No cranial nerve deficit or sensory deficit. Coordination normal. GCS eye subscore is 4. GCS verbal subscore is 5. GCS motor subscore is 6.  Skin: Skin is warm, dry and intact. No rash noted. No cyanosis.  Erythema bilateral anterior shin region  Weeping of clear fluid bilateral lower extremities  Psychiatric: He has a normal mood and affect. His speech is normal and behavior is normal. Thought content normal.    ED Course  Procedures (including critical care time) Labs Review Labs Reviewed  CBC WITH DIFFERENTIAL - Abnormal; Notable for the following:    Lymphocytes Relative 10 (*)    All other components within normal limits  COMPREHENSIVE METABOLIC PANEL - Abnormal; Notable for the following:     Glucose, Bld 104 (*)    BUN 24 (*)    GFR calc non Af Amer 49 (*)    GFR calc Af Amer 57 (*)    All other components within normal limits  URINALYSIS, ROUTINE W REFLEX MICROSCOPIC - Abnormal; Notable for the following:    APPearance HAZY (*)    Leukocytes, UA SMALL (*)    All other components within normal limits  PRO B NATRIURETIC PEPTIDE - Abnormal; Notable for the following:    Pro B Natriuretic peptide (BNP) 1533.0 (*)    All other components within normal limits  URINE MICROSCOPIC-ADD ON - Abnormal; Notable for the following:    Casts HYALINE CASTS (*)    All other components within normal limits  URINE CULTURE  TROPONIN I  TSH    Imaging Review Dg Chest 2 View  04/07/2014   CLINICAL DATA:  Lower leg edema, former smoking history  EXAM: CHEST  2 VIEW  COMPARISON:  Chest x-ray of 11/15/2013  FINDINGS: The lungs remain somewhat hyper aerated. There is moderate cardiomegaly present. No infiltrate or effusion is seen, and there is no present evidence of pulmonary vascular congestion. The bones are osteopenic and there are degenerative changes throughout the thoracic spine. There is an apparent old mild partial compression deformity of a mid thoracic vertebral body noted. Deformity of the proximal right humerus most likely is due to prior fracture with healing.  IMPRESSION: Moderate cardiomegaly.  Hyper aeration.  No active lung disease.   Electronically Signed   By: Dwyane DeePaul  Barry M.D.   On: 04/07/2014 17:05   Ct Head Wo Contrast  04/07/2014   CLINICAL DATA:  Fall. Hit back of head on a wall. No loss of consciousness.  EXAM: CT HEAD WITHOUT CONTRAST  CT CERVICAL SPINE WITHOUT CONTRAST  TECHNIQUE: Multidetector CT imaging of the head and cervical spine was performed following the standard protocol without intravenous contrast. Multiplanar CT image reconstructions of the cervical spine were also generated.  COMPARISON:  CT head and cervical spine 09/11/2013.  FINDINGS: CT HEAD FINDINGS  No  acute cortical infarct, hemorrhage, or mass lesion is present. Mild generalized atrophy and white matter disease is stable. The ventricles are stable size. No significant extra-axial fluid collection is present.  Right paramedian posterior scalp soft tissue swelling/ hemorrhage is present without an underlying fracture. The paranasal sinuses and mastoid air cells are clear. Postoperative changes noted in the  right globe.  CT CERVICAL SPINE FINDINGS  The cervical spine is imaged from the skullbase through T2-3. Slight anterolisthesis is evident at C3-4 and C4-5. Chronic endplate and facet degenerative changes are stable. No acute fracture or traumatic subluxation is evident.  Edema or atelectasis is present at the lung apices. Atherosclerotic calcifications are noted at the aortic arch and carotid bifurcations bilaterally.  IMPRESSION: 1. Soft swelling/hematoma along the right posteromedial scalp without an underlying fracture. 2. Stable atrophy and white matter disease. 3. Moderate spondylosis of the cervical spine without an underlying fracture.   Electronically Signed   By: Gennette Pac M.D.   On: 04/07/2014 16:47   Ct Cervical Spine Wo Contrast  04/07/2014   CLINICAL DATA:  Fall. Hit back of head on a wall. No loss of consciousness.  EXAM: CT HEAD WITHOUT CONTRAST  CT CERVICAL SPINE WITHOUT CONTRAST  TECHNIQUE: Multidetector CT imaging of the head and cervical spine was performed following the standard protocol without intravenous contrast. Multiplanar CT image reconstructions of the cervical spine were also generated.  COMPARISON:  CT head and cervical spine 09/11/2013.  FINDINGS: CT HEAD FINDINGS  No acute cortical infarct, hemorrhage, or mass lesion is present. Mild generalized atrophy and white matter disease is stable. The ventricles are stable size. No significant extra-axial fluid collection is present.  Right paramedian posterior scalp soft tissue swelling/ hemorrhage is present without an underlying  fracture. The paranasal sinuses and mastoid air cells are clear. Postoperative changes noted in the right globe.  CT CERVICAL SPINE FINDINGS  The cervical spine is imaged from the skullbase through T2-3. Slight anterolisthesis is evident at C3-4 and C4-5. Chronic endplate and facet degenerative changes are stable. No acute fracture or traumatic subluxation is evident.  Edema or atelectasis is present at the lung apices. Atherosclerotic calcifications are noted at the aortic arch and carotid bifurcations bilaterally.  IMPRESSION: 1. Soft swelling/hematoma along the right posteromedial scalp without an underlying fracture. 2. Stable atrophy and white matter disease. 3. Moderate spondylosis of the cervical spine without an underlying fracture.   Electronically Signed   By: Gennette Pac M.D.   On: 04/07/2014 16:47     EKG Interpretation None      MDM   Final diagnoses:  Lower leg edema  Congestive heart failure, unspecified congestive heart failure chronicity, unspecified congestive heart failure type    Patient presents to the ER for evaluation of a fall. Patient had a mechanical fall when his toe got caught on the carpet and he fell backwards, hitting his head. No loss of consciousness. Normal neurologic exam. Examination revealed bilateral paraspinal muscle tenderness of the neck, no significant midline tenderness. CT head and cervical spine were negative for acute injury. Patient did not have any tenderness in the thoracic or lumbar spine area, no concern for pelvic or hip injury.  Patient was on his way to the doctor's office when he fell. He was to be seen for significant swelling of bilateral lower extremities. This has occurred over the last 2 weeks, but in the last 2 or 3 days has significantly worsened. Today he had started weeping fluid, soaking his pajamas when he woke up this morning. The redness has developed over the past week or so. Suspect that the redness is simple dermatitis, cannot  rule out cellulitis, as these are difficult to differentiate. Will add on a TSH to rule out myxedema.  The patient does have a history of congestive heart failure and aortic valve replacement. He is  not experiencing any chest pain. He does endorse dyspnea on exertion. Initial troponin and EKG unremarkable. BNP is approximately at his baseline. Echo from 2004 that revealed ejection fraction of 60-65%. It is therefore difficult to differentiate acute congestive heart failure from peripheral edema in this patient. He will, however, require extensive diuresis which cannot be performed on an outpatient basis. Patient will be admitted to the hospital for further management.    Gilda Crease, MD 04/07/14 [id number]

## 2014-04-08 DIAGNOSIS — I5042 Chronic combined systolic (congestive) and diastolic (congestive) heart failure: Secondary | ICD-10-CM | POA: Diagnosis present

## 2014-04-08 DIAGNOSIS — I4891 Unspecified atrial fibrillation: Secondary | ICD-10-CM

## 2014-04-08 DIAGNOSIS — I5032 Chronic diastolic (congestive) heart failure: Secondary | ICD-10-CM

## 2014-04-08 DIAGNOSIS — I872 Venous insufficiency (chronic) (peripheral): Principal | ICD-10-CM

## 2014-04-08 DIAGNOSIS — R600 Localized edema: Secondary | ICD-10-CM | POA: Diagnosis present

## 2014-04-08 DIAGNOSIS — R0602 Shortness of breath: Secondary | ICD-10-CM

## 2014-04-08 LAB — BASIC METABOLIC PANEL
ANION GAP: 11 (ref 5–15)
BUN: 27 mg/dL — ABNORMAL HIGH (ref 6–23)
CHLORIDE: 101 meq/L (ref 96–112)
CO2: 27 meq/L (ref 19–32)
Calcium: 8.9 mg/dL (ref 8.4–10.5)
Creatinine, Ser: 1.37 mg/dL — ABNORMAL HIGH (ref 0.50–1.35)
GFR calc Af Amer: 52 mL/min — ABNORMAL LOW (ref >90)
GFR calc non Af Amer: 45 mL/min — ABNORMAL LOW (ref >90)
Glucose, Bld: 104 mg/dL — ABNORMAL HIGH (ref 70–99)
Potassium: 4.1 mEq/L (ref 3.7–5.3)
Sodium: 139 mEq/L (ref 137–147)

## 2014-04-08 LAB — URINE CULTURE
Colony Count: NO GROWTH
Culture: NO GROWTH

## 2014-04-08 LAB — TROPONIN I

## 2014-04-08 MED ORDER — AMIODARONE HCL 200 MG PO TABS
200.0000 mg | ORAL_TABLET | Freq: Every day | ORAL | Status: DC
Start: 2014-04-08 — End: 2014-04-08
  Administered 2014-04-08: 200 mg via ORAL
  Filled 2014-04-08: qty 1

## 2014-04-08 NOTE — Discharge Summary (Signed)
Name: Eugene [name] MRN: [id number] DOB: 04-27-1924 80 y.o. PCP: Earl Lagos, MD  Date of Admission: 04/07/2014  3:16 PM Date of Discharge: 04/09/2014 Attending Physician: Dr. Josem Kaufmann  Discharge Diagnosis: Principal Problem:   Chronic venous insufficiency Active Problems:   Essential hypertension, benign   Aortic stenosis   Multiple falls   Obstructive sleep apnea   Chronic diastolic heart failure   Bilateral lower extremity edema   PAF (paroxysmal atrial fibrillation)  Discharge Medications:   Medication List         amiodarone 200 MG tablet  Commonly known as:  PACERONE  Take 200 mg by mouth daily.     aspirin EC 81 MG tablet  Take 81 mg by mouth daily.     benzonatate 100 MG capsule  Commonly known as:  TESSALON PERLES  Take 1 capsule (100 mg total) by mouth 3 (three) times daily as needed for cough.     CALCIUM 600+D 600-800 MG-UNIT Tabs  Generic drug:  Calcium Carb-Cholecalciferol  Take 1 tablet by mouth daily.     ferrous sulfate 325 (65 FE) MG tablet  Take 325 mg by mouth daily with breakfast.     furosemide 20 MG tablet  Commonly known as:  LASIX  Take 1 tablet (20 mg total) by mouth every morning.     levothyroxine 25 MCG tablet  Commonly known as:  SYNTHROID, LEVOTHROID  Take 25 mcg by mouth daily before breakfast.     losartan 50 MG tablet  Commonly known as:  COZAAR  Take 50 mg by mouth daily.     oxybutynin 5 MG tablet  Commonly known as:  DITROPAN  Take 5 mg by mouth daily.     pantoprazole 40 MG tablet  Commonly known as:  PROTONIX  Take 40 mg by mouth daily.     pravastatin 40 MG tablet  Commonly known as:  PRAVACHOL  Take 1 tablet (40 mg total) by mouth daily.     spironolactone 25 MG tablet  Commonly known as:  ALDACTONE  Take 12.5 mg by mouth every other day.     terazosin 1 MG capsule  Commonly known as:  HYTRIN  Take 1 mg by mouth at bedtime.        Disposition and follow-up:   Mr.Trayce N Bradish was discharged from  [facility] in Stable condition.  At the hospital follow up visit please address:  1.  Please ensure pt is sleeping on his bed or if still sleeping in recliner that he his sleeping with his legs elevated at hip height.   2.  Labs / imaging needed at time of follow-up: pt has ECHO scheduled w/ Dr. Excell Seltzer in 2-3 months.     Follow-up Appointments: Follow-up Information   Follow up with Evelena Peat, DO On 04/15/2014. (at 10:15am)    Specialty:  Internal Medicine   Contact information:   [address] [place] [address] [phone]       Follow up with Tonny Bollman, MD On 06/18/2014. (@ 3:15 PM)    Specialty:  Cardiology   Contact information:   [address] [place] [address] [phone]       Follow up with DICKSON,CHRISTOPHER S, MD. Schedule an appointment as soon as possible for a visit in 1 week. (as recommended on prior office visit and follow up ABIs)    Specialty:  Vascular Surgery   Contact information:   [address] [place]  [phone]       Discharge Instructions:  Discharge Instructions   Call MD for:  difficulty breathing, headache or visual disturbances    Complete by:  As directed      Call MD for:  redness, tenderness, or signs of infection (pain, swelling, redness, odor or green/yellow discharge around incision site)    Complete by:  As directed      Call MD for:  severe uncontrolled pain    Complete by:  As directed      Diet - low sodium heart healthy    Complete by:  As directed      Increase activity slowly    Complete by:  As directed            Procedures Performed:  Dg Chest 2 View  04/07/2014   CLINICAL DATA:  Lower leg edema, former smoking history  EXAM: CHEST  2 VIEW  COMPARISON:  Chest x-ray of 11/15/2013  FINDINGS: The lungs remain somewhat hyper aerated. There is moderate cardiomegaly present. No infiltrate or effusion is seen, and there is no present evidence of pulmonary vascular  congestion. The bones are osteopenic and there are degenerative changes throughout the thoracic spine. There is an apparent old mild partial compression deformity of a mid thoracic vertebral body noted. Deformity of the proximal right humerus most likely is due to prior fracture with healing.  IMPRESSION: Moderate cardiomegaly.  Hyper aeration.  No active lung disease.   Electronically Signed   By: Dwyane DeePaul  Barry M.D.   On: 04/07/2014 17:05   Ct Head Wo Contrast  04/07/2014   CLINICAL DATA:  Fall. Hit back of head on a wall. No loss of consciousness.  EXAM: CT HEAD WITHOUT CONTRAST  CT CERVICAL SPINE WITHOUT CONTRAST  TECHNIQUE: Multidetector CT imaging of the head and cervical spine was performed following the standard protocol without intravenous contrast. Multiplanar CT image reconstructions of the cervical spine were also generated.  COMPARISON:  CT head and cervical spine 09/11/2013.  FINDINGS: CT HEAD FINDINGS  No acute cortical infarct, hemorrhage, or mass lesion is present. Mild generalized atrophy and white matter disease is stable. The ventricles are stable size. No significant extra-axial fluid collection is present.  Right paramedian posterior scalp soft tissue swelling/ hemorrhage is present without an underlying fracture. The paranasal sinuses and mastoid air cells are clear. Postoperative changes noted in the right globe.  CT CERVICAL SPINE FINDINGS  The cervical spine is imaged from the skullbase through T2-3. Slight anterolisthesis is evident at C3-4 and C4-5. Chronic endplate and facet degenerative changes are stable. No acute fracture or traumatic subluxation is evident.  Edema or atelectasis is present at the lung apices. Atherosclerotic calcifications are noted at the aortic arch and carotid bifurcations bilaterally.  IMPRESSION: 1. Soft swelling/hematoma along the right posteromedial scalp without an underlying fracture. 2. Stable atrophy and white matter disease. 3. Moderate spondylosis of the  cervical spine without an underlying fracture.   Electronically Signed   By: Gennette Pachris  Mattern M.D.   On: 04/07/2014 16:47   Ct Cervical Spine Wo Contrast  04/07/2014   CLINICAL DATA:  Fall. Hit back of head on a wall. No loss of consciousness.  EXAM: CT HEAD WITHOUT CONTRAST  CT CERVICAL SPINE WITHOUT CONTRAST  TECHNIQUE: Multidetector CT imaging of the head and cervical spine was performed following the standard protocol without intravenous contrast. Multiplanar CT image reconstructions of the cervical spine were also generated.  COMPARISON:  CT head and cervical spine  09/11/2013.  FINDINGS: CT HEAD FINDINGS  No acute cortical infarct, hemorrhage, or mass lesion is present. Mild generalized atrophy and white matter disease is stable. The ventricles are stable size. No significant extra-axial fluid collection is present.  Right paramedian posterior scalp soft tissue swelling/ hemorrhage is present without an underlying fracture. The paranasal sinuses and mastoid air cells are clear. Postoperative changes noted in the right globe.  CT CERVICAL SPINE FINDINGS  The cervical spine is imaged from the skullbase through T2-3. Slight anterolisthesis is evident at C3-4 and C4-5. Chronic endplate and facet degenerative changes are stable. No acute fracture or traumatic subluxation is evident.  Edema or atelectasis is present at the lung apices. Atherosclerotic calcifications are noted at the aortic arch and carotid bifurcations bilaterally.  IMPRESSION: 1. Soft swelling/hematoma along the right posteromedial scalp without an underlying fracture. 2. Stable atrophy and white matter disease. 3. Moderate spondylosis of the cervical spine without an underlying fracture.   Electronically Signed   By: Gennette Pachris  Mattern M.D.   On: 04/07/2014 16:47     Admission HPI: "Mr. Barrie Dunkerfird N Brandel is a 80 y.o. male w/ PMHx of hyperlipidemia, chronic systolic heart failure, chronic venous insufficiency, bradycardia, SVT, CAD, aortic stenosis,  OSA, urinary incontinence and atherosclerotic PVD with ulercation who presents to the ED by EMS after a fall. The patient stated he was walking to the bathroom and tripped over his own shoes which he was wearing. He states the shoes are too big for him and he has ordered new ones. This problem has happened to him previously. When the patient fell, he fell sideways and hit his head. He denies any dizziness before the fall and denies any LOC before or after the fall. EMS was called and the [facility]. Pt also admits to SOB and DOE which is stable since January. He has noticed increased swelling, redness and water "secreting" from both of his legs in the past few weeks. He also admits to a 20 pound weight gain in the last two months. Patient states he has been taking all of his home medications as prescribed." [facility]   Hospital Course by problem list:   1. Chronic venous insufficiency with increasing lower extremity edema b/l. Continued SOB and DOE that has been present since January. Pt was then admitted and diuresed. Since admission patient has diuresed 2L with lasix. Swelling in legs decreased from +3 to +2 pitting edema the next day. Aside from lasix, decrease in edema also in part due to pt sleeping in bed with legs elevated overnight as it was discovered that he has been sleeping in his recliner the past few days due to muscle cramps. Troponins negative x 3, EKG revealed no new changes, and CXR revealed moderate cardiomegaly. We were not concerned for any acute cardiac process and discharged him on his home meds. Pt instructed not to sleep with legs below hip height to prevent dependent edema. Pt thinks he has gained weight but actually Pt's weight was 208 on admission which was actually decreased from previous admission in December. F/u with Dr. Durwin Noraixon in July for repeat ABI's.   2. Fall: pt presented after falling from loss of balance. CT head and  cervical spine were obtained, soft swelling/hematoma noted along rt posteromedial scalp. Pt had no lucid intervals or change in mental status. No acute abnormalities were noted in CT spine. Recommended fall precautions at home. Has a care taker at home  3.  Chronic diastolic HF: Initially diuresed w/ lasix. HF appears stable. Discharged on home meds. Rec'd f/u with Dr. Excell Seltzer as scheduled. Of note legal guardian says cardiologist is Dr. Leona Singleton. Rec'd f/u with Dr. Leona Singleton after discharge.   4. Paroxysmal Afib: Per cardiology note it appears he was on 100mg  of amiodarone but confirmed with pharmacy that he was last prescribed 200mg  by Dr. Excell Seltzer in June. Continue amiodarone 200mg . Sinus rhythm this admission. Not on anticoagulation. Rec'd cardiology f/u.   Discharge Vitals:   BP 104/62  Pulse 72  Temp(Src) 98.4 F (36.9 C) (Axillary)  Resp 20  Ht 5\' 7"  (1.702 m)  Wt 208 lb 5.4 oz (94.5 kg)  BMI 32.62 kg/m2  SpO2 95%  Discharge Labs:  Basic Metabolic Panel:  Recent Labs  45/40/98 1544 04/08/14 0344  NA 141 139  K 4.5 4.1  CL 101 101  CO2 28 27  GLUCOSE 104* 104*  BUN 24* 27*  CREATININE 1.26 1.37*  CALCIUM 9.4 8.9   Liver Function Tests:  Recent Labs  04/07/14 1544  AST 18  ALT 12  ALKPHOS 97  BILITOT 0.5  PROT 6.7  ALBUMIN 3.6  CBC:  Recent Labs  04/07/14 1544  WBC 7.7  NEUTROABS 5.9  HGB 15.7  HCT 45.7  MCV 92.0  PLT PLATELET CLUMPS NOTED ON SMEAR, UNABLE TO ESTIMATE   Cardiac Enzymes:  Recent Labs  04/07/14 1544 04/07/14 2217 04/08/14 0340  TROPONINI <0.30 <0.30 <0.30   BNP:  Recent Labs  04/07/14 1544  PROBNP 1533.0*   Thyroid Function Tests:  Recent Labs  04/07/14 1828  TSH 4.000   Urine Drug Screen: Drugs of Abuse     Component Value Date/Time   LABOPIA NONE DETECTED 11/23/2010 0646   COCAINSCRNUR NONE DETECTED 11/23/2010 0646   LABBENZ NONE DETECTED 11/23/2010 0646   AMPHETMU NONE DETECTED 11/23/2010 0646   THCU NONE DETECTED  11/23/2010 0646   LABBARB  Value: NONE DETECTED        DRUG SCREEN FOR MEDICAL PURPOSES ONLY.  IF CONFIRMATION IS NEEDED FOR ANY PURPOSE, NOTIFY LAB WITHIN 5 DAYS.        LOWEST DETECTABLE LIMITS FOR URINE DRUG SCREEN Drug Class       Cutoff (ng/mL) Amphetamine      1000 Barbiturate      200 Benzodiazepine   200 Tricyclics       300 Opiates          300 Cocaine          300 THC              50 11/23/2010 0646    Urinalysis:  Recent Labs  04/07/14 1713  COLORURINE YELLOW  LABSPEC 1.016  PHURINE 6.5  GLUCOSEU NEGATIVE  HGBUR NEGATIVE  BILIRUBINUR NEGATIVE  KETONESUR NEGATIVE  PROTEINUR NEGATIVE  UROBILINOGEN 1.0  NITRITE NEGATIVE  LEUKOCYTESUR SMALL*     Signed: Gara Kroner, MD 04/09/2014, 5:34 PM    Services Ordered on Discharge: none Equipment Ordered on Discharge: none

## 2014-04-08 NOTE — Plan of Care (Signed)
Problem: Phase I Progression Outcomes Goal: EF % per last Echo/documented,Core Reminder form on chart EF 60-65% per 2D echo on 03/13/2013

## 2014-04-08 NOTE — Progress Notes (Signed)
Internal Medicine Attending Admission Note Date: 04/08/2014  Patient name: Eugene [name] Medical record number: [id number] Date of birth: 05-Sep-1924 Age: 80 y.o. Gender: male  I saw and evaluated the patient. I reviewed the resident's note and I agree with the resident's findings and plan as documented in the resident's note.  Chief Complaint(s): Lower extremity swelling X 2-3 days  History - key components related to admission:  Eugene [name] is an 80 year old man with a history of chronic systolic heart failure, critical aortic stenosis status post balloon valvuloplasty, chronic venous insufficiency, and obstructive sleep apnea who presented to the emergency department after a mechanical fall. Evaluation revealed no significant trauma. He did note worsening dyspnea on exertion over the last 6 months and approximately 2-3 days of worsening lower extremity edema. He denied any change in his baseline orthopnea or any paroxysmal nocturnal dyspnea. He noted that he did spend the last several days sleeping in a recliner with his legs in a dependent position because of cramping that he had while lying in bed. He started taking a vitamin supplement which resolved the cramping. He was admitted to the hospital for this worsening lower extremity edema and further evaluation. He states he's been compliant with his medications. He is without other complaints at this time.  Since admission he has diuresed well with 2 L coming off overnight after his Lasix dose and lying in bed with his legs elevated. He states he slept extremely well in the bed with no further lower extremity cramping. He feels that after the diuresis his lower extremity edema is markedly improved and he feels safe to return home. Followup BUN and creatinine show a slight bump this morning suggesting that further diuresis could result in a prerenal azotemia.  Physical Exam - key components related to admission:  Filed Vitals:   04/07/14 1830  04/07/14 1929 04/08/14 0121 04/08/14 0527  BP: 136/77 143/76 103/57 104/62  Pulse: 79 72 71 72  Temp:  98.1 F (36.7 C) 98 F (36.7 C) 98.4 F (36.9 C)  TempSrc:  Oral Axillary Axillary  Resp: 20 20 18 20   Height:  5\' 7"  (1.702 m)    Weight:  208 lb (94.348 kg)  208 lb 5.4 oz (94.5 kg)  SpO2: 96% 100% 98% 95%   General: Well-developed, well-nourished, extremely pleasant man lying comfortably in bed in no acute distress. Heart: Distant heart sounds with a crescendo decrescendo murmur in the aortic area. Extremities: Changes consistent with chronic venous stasis bilaterally including erythematous patches distally. He had 3+ pitting edema to the knees bilaterally.  Lab results:  Basic Metabolic Panel:  Recent Labs  11/91/47 1544 04/08/14 0344  NA 141 139  K 4.5 4.1  CL 101 101  CO2 28 27  GLUCOSE 104* 104*  BUN 24* 27*  CREATININE 1.26 1.37*  CALCIUM 9.4 8.9   Liver Function Tests:  Recent Labs  04/07/14 1544  AST 18  ALT 12  ALKPHOS 97  BILITOT 0.5  PROT 6.7  ALBUMIN 3.6   CBC:  Recent Labs  04/07/14 1544  WBC 7.7  NEUTROABS 5.9  HGB 15.7  HCT 45.7  MCV 92.0  PLT PLATELET CLUMPS NOTED ON SMEAR, UNABLE TO ESTIMATE   Cardiac Enzymes:  Recent Labs  04/07/14 1544 04/07/14 2217 04/08/14 0340  TROPONINI <0.30 <0.30 <0.30   Thyroid Function Tests:  Recent Labs  04/07/14 1828  TSH 4.000   Urinalysis:  Yellow, hazy, small leukocytes, 7-10 white blood cells per high-power field.  Imaging results:  Dg Chest 2 View  04/07/2014   CLINICAL DATA:  Lower leg edema, former smoking history  EXAM: CHEST  2 VIEW  COMPARISON:  Chest x-ray of 11/15/2013  FINDINGS: The lungs remain somewhat hyper aerated. There is moderate cardiomegaly present. No infiltrate or effusion is seen, and there is no present evidence of pulmonary vascular congestion. The bones are osteopenic and there are degenerative changes throughout the thoracic spine. There is an apparent old mild  partial compression deformity of a mid thoracic vertebral body noted. Deformity of the proximal right humerus most likely is due to prior fracture with healing.  IMPRESSION: Moderate cardiomegaly.  Hyper aeration.  No active lung disease.   Electronically Signed   By: Dwyane DeePaul  Barry M.D.   On: 04/07/2014 17:05   Ct Head Wo Contrast  04/07/2014   CLINICAL DATA:  Fall. Hit back of head on a wall. No loss of consciousness.  EXAM: CT HEAD WITHOUT CONTRAST  CT CERVICAL SPINE WITHOUT CONTRAST  TECHNIQUE: Multidetector CT imaging of the head and cervical spine was performed following the standard protocol without intravenous contrast. Multiplanar CT image reconstructions of the cervical spine were also generated.  COMPARISON:  CT head and cervical spine 09/11/2013.  FINDINGS: CT HEAD FINDINGS  No acute cortical infarct, hemorrhage, or mass lesion is present. Mild generalized atrophy and white matter disease is stable. The ventricles are stable size. No significant extra-axial fluid collection is present.  Right paramedian posterior scalp soft tissue swelling/ hemorrhage is present without an underlying fracture. The paranasal sinuses and mastoid air cells are clear. Postoperative changes noted in the right globe.  CT CERVICAL SPINE FINDINGS  The cervical spine is imaged from the skullbase through T2-3. Slight anterolisthesis is evident at C3-4 and C4-5. Chronic endplate and facet degenerative changes are stable. No acute fracture or traumatic subluxation is evident.  Edema or atelectasis is present at the lung apices. Atherosclerotic calcifications are noted at the aortic arch and carotid bifurcations bilaterally.  IMPRESSION: 1. Soft swelling/hematoma along the right posteromedial scalp without an underlying fracture. 2. Stable atrophy and white matter disease. 3. Moderate spondylosis of the cervical spine without an underlying fracture.   Electronically Signed   By: Gennette Pachris  Mattern M.D.   On: 04/07/2014 16:47   Ct  Cervical Spine Wo Contrast  04/07/2014   CLINICAL DATA:  Fall. Hit back of head on a wall. No loss of consciousness.  EXAM: CT HEAD WITHOUT CONTRAST  CT CERVICAL SPINE WITHOUT CONTRAST  TECHNIQUE: Multidetector CT imaging of the head and cervical spine was performed following the standard protocol without intravenous contrast. Multiplanar CT image reconstructions of the cervical spine were also generated.  COMPARISON:  CT head and cervical spine 09/11/2013.  FINDINGS: CT HEAD FINDINGS  No acute cortical infarct, hemorrhage, or mass lesion is present. Mild generalized atrophy and white matter disease is stable. The ventricles are stable size. No significant extra-axial fluid collection is present.  Right paramedian posterior scalp soft tissue swelling/ hemorrhage is present without an underlying fracture. The paranasal sinuses and mastoid air cells are clear. Postoperative changes noted in the right globe.  CT CERVICAL SPINE FINDINGS  The cervical spine is imaged from the skullbase through T2-3. Slight anterolisthesis is evident at C3-4 and C4-5. Chronic endplate and facet degenerative changes are stable. No acute fracture or traumatic subluxation is evident.  Edema or atelectasis is present at the lung apices. Atherosclerotic calcifications are noted at the aortic arch and carotid bifurcations bilaterally.  IMPRESSION: 1. Soft swelling/hematoma along the right posteromedial scalp without an underlying fracture. 2. Stable atrophy and white matter disease. 3. Moderate spondylosis of the cervical spine without an underlying fracture.   Electronically Signed   By: Gennette Pachris  Mattern M.D.   On: 04/07/2014 16:47   Other results:  EKG: Normal sinus rhythm at 80 beats per minute, first degree AV block, left anterior hemiblock, nonspecific ST-T changes with poor R-wave progression laterally, no significant changes from the previous ECG on 12/10/2013.  Assessment & Plan by Problem:  Eugene [name] is an 80 year old man with a  history of chronic systolic heart failure, critical aortic stenosis status post balloon angioplasty, chronic venous insufficiency, paroxysmal atrial fibrillation, and obstructive sleep apnea who presents after a fall with a two to three-day history of worsening lower extremity edema. He has signs and symptoms consistent with chronic systolic heart failure and may benefit from afterload reduction and mild diuresis. The cause of his worsening lower extremity edema though, was likely related to the dependent nature of his lower extremities while he was sleeping in a recliner over the last several days. Given his underlying chronic venous insufficiency this is not surprising. With the IV Lasix diuresis and keeping his legs elevated overnight there was marked improvement in his chronic venous insufficiency and lower extremity edema. Further diuresis may result in prerenal azotemia given the slight bump in the BUN and creatinine from last night's diuresis. Therefore, further diuresis is not warranted, but work on lowering his afterload may be helpful chronically. This can be done in the outpatient clinic.  Plan  1) Chronic lower extremity venous insufficiency: This is markedly improved after the IV diuresis he received last night. Even more important, was the fact that he slept with his legs elevated that further led to a decrease in his venous insufficiency associated edema. We discussed the importance of trying to sleep in bed with his legs elevated rather than in a recliner so that further decreases in his lower extremity edema could occur. He will be placed back on his chronic heart failure regimen with followup [facility].

## 2014-04-08 NOTE — Progress Notes (Signed)
Physical Therapy Note  Spoke with MD who states pt is going home today and PT eval is no longer necessary. PT signing off at this time, if needs change please reconsult.   Ruthann CancerLaura Hamilton, PT, DPT Acute Rehabilitation Services Pager: [phone]

## 2014-04-08 NOTE — Progress Notes (Signed)
Subjective: Pt sleeps on his recliner at home b/c he gets cramps at night. He usually has his feet lowered on the recliner. He states he notices a drastic improvement of the edema in his feet. Denies SOB and states he slept well overnight. States he has right sided neck pain from fall yesterday.   Objective: Vital signs in last 24 hours: Filed Vitals:   04/07/14 1830 04/07/14 1929 04/08/14 0121 04/08/14 0527  BP: 136/77 143/76 103/57 104/62  Pulse: 79 72 71 72  Temp:  98.1 F (36.7 C) 98 F (36.7 C) 98.4 F (36.9 C)  TempSrc:  Oral Axillary Axillary  Resp: 20 20 18 20   Height:  5\' 7"  (1.702 m)    Weight:  208 lb (94.348 kg)  208 lb 5.4 oz (94.5 kg)  SpO2: 96% 100% 98% 95%    Intake/Output Summary (Last 24 hours) at 04/08/14 1225 Last data filed at 04/08/14 [id number]  Gross per 24 hour  Intake    960 ml  Output   2720 ml  Net  -1760 ml   General appearance: alert, cooperative and no distress Head: atraumatic Lungs: clear to auscultation bilaterally Heart: regular rate and rhythm, S1, S2 normal, no murmur, click, rub or gallop Abdomen: soft, non-tender; bowel sounds normal; no masses,  no organomegaly Extremities: +2 pitting edema b/l up to knees, no drainage from lesions on anterior shin, pink/scaley skin on lower legs Lab Results: Basic Metabolic Panel:  Recent Labs  96/01/5406/01/15 1544 04/08/14 0344  NA 141 139  K 4.5 4.1  CL 101 101  CO2 28 27  GLUCOSE 104* 104*  BUN 24* 27*  CREATININE 1.26 1.37*  CALCIUM 9.4 8.9   Liver Function Tests:  Recent Labs  04/07/14 1544  AST 18  ALT 12  ALKPHOS 97  BILITOT 0.5  PROT 6.7  ALBUMIN 3.6  CBC:  Recent Labs  04/07/14 1544  WBC 7.7  NEUTROABS 5.9  HGB 15.7  HCT 45.7  MCV 92.0  PLT PLATELET CLUMPS NOTED ON SMEAR, UNABLE TO ESTIMATE   Cardiac Enzymes:  Recent Labs  04/07/14 1544 04/07/14 2217 04/08/14 0340  TROPONINI <0.30 <0.30 <0.30   BNP:  Recent Labs  04/07/14 1544  PROBNP 1533.0*   Thyroid  Function Tests:  Recent Labs  04/07/14 1828  TSH 4.000     Urinalysis:  Recent Labs  04/07/14 1713  COLORURINE YELLOW  LABSPEC 1.016  PHURINE 6.5  GLUCOSEU NEGATIVE  HGBUR NEGATIVE  BILIRUBINUR NEGATIVE  KETONESUR NEGATIVE  PROTEINUR NEGATIVE  UROBILINOGEN 1.0  NITRITE NEGATIVE  LEUKOCYTESUR SMALL*    Micro Results: Urine cultures pending  Studies/Results: Dg Chest 2 View  04/07/2014   CLINICAL DATA:  Lower leg edema, former smoking history  EXAM: CHEST  2 VIEW  COMPARISON:  Chest x-ray of 11/15/2013  FINDINGS: The lungs remain somewhat hyper aerated. There is moderate cardiomegaly present. No infiltrate or effusion is seen, and there is no present evidence of pulmonary vascular congestion. The bones are osteopenic and there are degenerative changes throughout the thoracic spine. There is an apparent old mild partial compression deformity of a mid thoracic vertebral body noted. Deformity of the proximal right humerus most likely is due to prior fracture with healing.  IMPRESSION: Moderate cardiomegaly.  Hyper aeration.  No active lung disease.   Electronically Signed   By: Dwyane DeePaul  Barry M.D.   On: 04/07/2014 17:05   Ct Head Wo Contrast  04/07/2014   CLINICAL DATA:  Fall. Hit back of  head on a wall. No loss of consciousness.  EXAM: CT HEAD WITHOUT CONTRAST  CT CERVICAL SPINE WITHOUT CONTRAST  TECHNIQUE: Multidetector CT imaging of the head and cervical spine was performed following the standard protocol without intravenous contrast. Multiplanar CT image reconstructions of the cervical spine were also generated.  COMPARISON:  CT head and cervical spine 09/11/2013.  FINDINGS: CT HEAD FINDINGS  No acute cortical infarct, hemorrhage, or mass lesion is present. Mild generalized atrophy and white matter disease is stable. The ventricles are stable size. No significant extra-axial fluid collection is present.  Right paramedian posterior scalp soft tissue swelling/ hemorrhage is present without  an underlying fracture. The paranasal sinuses and mastoid air cells are clear. Postoperative changes noted in the right globe.  CT CERVICAL SPINE FINDINGS  The cervical spine is imaged from the skullbase through T2-3. Slight anterolisthesis is evident at C3-4 and C4-5. Chronic endplate and facet degenerative changes are stable. No acute fracture or traumatic subluxation is evident.  Edema or atelectasis is present at the lung apices. Atherosclerotic calcifications are noted at the aortic arch and carotid bifurcations bilaterally.  IMPRESSION: 1. Soft swelling/hematoma along the right posteromedial scalp without an underlying fracture. 2. Stable atrophy and white matter disease. 3. Moderate spondylosis of the cervical spine without an underlying fracture.   Electronically Signed   By: Gennette Pac M.D.   On: 04/07/2014 16:47   Ct Cervical Spine Wo Contrast  04/07/2014   CLINICAL DATA:  Fall. Hit back of head on a wall. No loss of consciousness.  EXAM: CT HEAD WITHOUT CONTRAST  CT CERVICAL SPINE WITHOUT CONTRAST  TECHNIQUE: Multidetector CT imaging of the head and cervical spine was performed following the standard protocol without intravenous contrast. Multiplanar CT image reconstructions of the cervical spine were also generated.  COMPARISON:  CT head and cervical spine 09/11/2013.  FINDINGS: CT HEAD FINDINGS  No acute cortical infarct, hemorrhage, or mass lesion is present. Mild generalized atrophy and white matter disease is stable. The ventricles are stable size. No significant extra-axial fluid collection is present.  Right paramedian posterior scalp soft tissue swelling/ hemorrhage is present without an underlying fracture. The paranasal sinuses and mastoid air cells are clear. Postoperative changes noted in the right globe.  CT CERVICAL SPINE FINDINGS  The cervical spine is imaged from the skullbase through T2-3. Slight anterolisthesis is evident at C3-4 and C4-5. Chronic endplate and facet degenerative  changes are stable. No acute fracture or traumatic subluxation is evident.  Edema or atelectasis is present at the lung apices. Atherosclerotic calcifications are noted at the aortic arch and carotid bifurcations bilaterally.  IMPRESSION: 1. Soft swelling/hematoma along the right posteromedial scalp without an underlying fracture. 2. Stable atrophy and white matter disease. 3. Moderate spondylosis of the cervical spine without an underlying fracture.   Electronically Signed   By: Gennette Pac M.D.   On: 04/07/2014 16:47   Medications: I have reviewed the patient's current medications. Scheduled Meds: . amiodarone  200 mg Oral Daily  . aspirin EC  81 mg Oral Daily  . calcium carbonate  1 tablet Oral Q breakfast  . cholecalciferol  800 Units Oral Daily  . enoxaparin (LOVENOX) injection  40 mg Subcutaneous Q24H  . levothyroxine  25 mcg Oral QAC breakfast  . losartan  50 mg Oral Daily  . oxybutynin  5 mg Oral Daily  . pantoprazole  40 mg Oral Daily  . simvastatin  20 mg Oral q1800  . [START ON 04/09/2014] spironolactone  12.5 mg Oral QODAY  . terazosin  1 mg Oral QHS   Continuous Infusions:  PRN Meds:.acetaminophen, acetaminophen Assessment/Plan:  Lower extremity edema in the setting of chronic systolic heart failure: - Pt presents with stable SOB, DOE, worsening 3-4+ pitting lower extremity edema bilaterally with erythema, warmth and blistering and weight gain. History of CHF, previous echo in 6/14 showed grade 1 diastolic dysfunction, severe aortic stenosis s/p balloon valvuloplasty, and HTN. CXR shows moderate cardiomegaly, hyperaeration. No active lung disease. At home, Pt is on amiodarone 200 mg daily, ASA 81 mg, lasix 20 mg daily, losartan 50 mg daily, pravastatin 40 mg daily, spironolactone 12.5 mg every other day. Worsening CHF may be due to an ischemic event. Negative troponins x 2 -discussed w/ patient on new sleeping habits - keeping legs at same height of rest of his body while laying  down and if sleeping in his recliner then keeping his legs elevated to at least hip height. As swelling thought to be partly due to dependent edema.  -ASA 81 mg, losartan 50 mg daily, simvastatin 20 mg daily, spironolactone 12.5 mg every other day  -lasix 80 mg x1. Pt has diuresed 2L.  -amiodarone 200 mg daily.  -oxygen 2L [place]  -daily weights  -I's/O's   -Aortic Stenosis- s/p AV balloon valvuloplasty 2/13. Currently asymptomatic. Home meds include ASA, furosemide, losartan, and amiodarone. Pt has history of GI bleed after being on Plavix for 1 month and therefore not properly anticoagulated.  -Continue ASA, amiodarone and losartan   -CAD- s/p cardiac catheterization with bare metal stent placement in proximal LAD 2/13. Pt denies any chest pain, chest tightness or chest pressure. Takes ASA, losartan, pravastatin and spironolactone at home.  -troponins negative x 2  -continue amiodarone, ASA, losartan, pravastatin, and spironolactone   -Paroxysmal Atrial Fibrillation- Currently NSR at 78 bpm with 1st degree AV block. Unchanged from previous EKGs.  -on amiodarone 200 mg daily.   -OSA-CPAP at night.   -Hypothyroidism- history of hypothyroidism. Consider amiodarone as a possible cause. Pt has TSH of 4.0 on admission. Pt takes synthroid 25 mcg at home.  -continue synthroid 25 mcg daily.   Urinary incontinence- Pt on terazosin 1 mg daily and oxybutynin 5 mg daily.   -DVT ppx- Lovenox 40 mg SQ daily  Dispo: Discharge today  The patient does have a current PCP (Earl LagosNischal Narendra, MD) and [facility].  The patient does have transportation limitations that hinder transportation to clinic appointments.     LOS: 1 day   Gara Kroneriana Orilla Templeman, MD 04/08/2014, 12:25 PM

## 2014-04-08 NOTE — Discharge Instructions (Signed)
Please sleep with legs at body level to avoid swelling in legs. If sleeping on recliner make sure legs are not lower than hip height. Please to try to sleep in your bed. Continue all home medication as prescribed. If worsening of swelling, shortness of breath, chest pain, increase in weight (measure your weight daily), or increased pain or redness in legs call your primary care provider office right away. If severe go to the emergency room.    Peripheral Edema You have swelling in your legs (peripheral edema). This swelling is due to excess accumulation of salt and water in your body. Edema may be a sign of heart, kidney or liver disease, or a side effect of a medication. It may also be due to problems in the leg veins. Elevating your legs and using special support stockings may be very helpful, if the cause of the swelling is due to poor venous circulation. Avoid long periods of standing, whatever the cause. Treatment of edema depends on identifying the cause. Chips, pretzels, pickles and other salty foods should be avoided. Restricting salt in your diet is almost always needed. Water pills (diuretics) are often used to remove the excess salt and water from your body via urine. These medicines prevent the kidney from reabsorbing sodium. This increases urine flow. Diuretic treatment may also result in lowering of potassium levels in your body. Potassium supplements may be needed if you have to use diuretics daily. Daily weights can help you keep track of your progress in clearing your edema. You should call your caregiver for follow up care as recommended. SEEK IMMEDIATE MEDICAL CARE IF:   You have increased swelling, pain, redness, or heat in your legs.  You develop shortness of breath, especially when lying down.  You develop chest or abdominal pain, weakness, or fainting.  You have a fever. Document Released: 11/01/2004 Document Revised: 12/17/2011 Document Reviewed: 10/12/2009 [facility] Patient  Information 2015 [place], [place]. This information is not intended to replace advice given to you by your health care provider. Make sure you discuss any questions you have with your health care provider.

## 2014-04-12 NOTE — Discharge Summary (Signed)
Agree with the above except for the following modifications: 1) chronic venous insufficiency exacerbated by legs being dependent while sleeping in a recliner.  He was educated on keeping his legs elevated at night to prevent worsening of his venous insufficiency. 2) He had no periods of LOC or mental status changes.

## 2014-04-15 ENCOUNTER — Encounter: Payer: Self-pay | Admitting: Internal Medicine

## 2014-04-15 ENCOUNTER — Ambulatory Visit (INDEPENDENT_AMBULATORY_CARE_PROVIDER_SITE_OTHER): Payer: Medicare Other | Admitting: Internal Medicine

## 2014-04-15 VITALS — BP 132/83 | HR 65 | Temp 97.9°F | Ht 67.0 in | Wt 213.4 lb

## 2014-04-15 DIAGNOSIS — I5032 Chronic diastolic (congestive) heart failure: Secondary | ICD-10-CM

## 2014-04-15 DIAGNOSIS — I872 Venous insufficiency (chronic) (peripheral): Secondary | ICD-10-CM

## 2014-04-15 DIAGNOSIS — N179 Acute kidney failure, unspecified: Secondary | ICD-10-CM

## 2014-04-15 DIAGNOSIS — I10 Essential (primary) hypertension: Secondary | ICD-10-CM

## 2014-04-15 LAB — BASIC METABOLIC PANEL WITH GFR
BUN: 20 mg/dL (ref 6–23)
CHLORIDE: 104 meq/L (ref 96–112)
CO2: 26 mEq/L (ref 19–32)
CREATININE: 0.96 mg/dL (ref 0.50–1.35)
Calcium: 9.3 mg/dL (ref 8.4–10.5)
GFR, EST NON AFRICAN AMERICAN: 71 mL/min
GFR, Est African American: 82 mL/min
Glucose, Bld: 81 mg/dL (ref 70–99)
POTASSIUM: 5.3 meq/L (ref 3.5–5.3)
Sodium: 141 mEq/L (ref 135–145)

## 2014-04-15 NOTE — Patient Instructions (Addendum)
1. Please keep your legs elevated.  Try sleeping in your bed to help with this.  If you cannot sleep in your bed try to keep your legs elevated by adding pillows to the leg portion of your recliner.  If the skin on your legs opens up and drains colored material, there is pain or odor to your legs please come back to clinic.     2. Please take all medications as prescribed. Keep taking your Lasix.     3. If you have worsening of your symptoms or new symptoms arise, please call the clinic [phone]), or go to the ER immediately if symptoms are severe.  Return to clinic in 3 months for follow-up with primary care doctor.  Return sooner if new or worsening symptoms.

## 2014-04-15 NOTE — Progress Notes (Signed)
   Subjective:    Patient ID: Eugene [name], male    DOB: 21-Jan-1924, 80 y.o.   MRN: [id number]  HPI Comments: Eugene [name] is an 80 year old with a PMH of CAD (s/p LAD stent 2013), dHF (EF 60-65% June 2014), aortic stenosis (s/p balloon valvuloplasty 2013), PAF, PAD, HTN and OSA recently hospitalized with lower extremity edema 2/2 to chronic venous insufficiency exacerbated by sleeping in a recliner with legs in the dependent position.  His heart failure appeared stable.  He was diuresed 2L and discharged on Lasix 20mg  daily.  He presents for hospital follow-up.  His caretaker is present.  His caretaker feels the legs have improved both in swelling and appearance since discharge however they are still swollen.  The patient denies dyspnea, chest pain, recent falls, dizziness or syncope.  He says that other than the leg swelling, he is doing well.  He reports compliance with Lasix but continues to have pitting edema with some weeping especially of the right leg.  He denies pain in the legs.  He tried sleeping in his bed for a few nights after he was discharged but he says he finds it uncomfortable.  He denies dyspnea, reports compliance with CPAP at night.  Says he is uncomfortable in bed 2/2 to muscle cramps.  He has resumed sleeping in his recliner and his caretaker has added a pillow to the leg portion of the recliner to help keep the legs elevated.  He has been sleeping in recliner since Nov 2014.   He is ambulating with walker without difficulty.       Review of Systems  Constitutional: Negative for fever, chills and appetite change.  Respiratory: Negative for shortness of breath.   Cardiovascular: Positive for leg swelling. Negative for chest pain and palpitations.  Gastrointestinal: Negative for nausea, vomiting, diarrhea and constipation.  Genitourinary: Negative for dysuria.  Neurological: Negative for syncope, weakness and light-headedness.       Objective:   Physical Exam  Vitals  reviewed. Constitutional: He is oriented to person, place, and time. He appears well-developed. No distress.  Cardiovascular: Normal rate and regular rhythm.  Exam reveals no gallop and no friction rub.   Murmur heard. Pulmonary/Chest: Effort normal and breath sounds normal. No respiratory distress. He has no wheezes. He has no rales.  Abdominal: Soft. Bowel sounds are normal. He exhibits no distension. There is no tenderness.  Musculoskeletal: Normal range of motion. He exhibits edema. He exhibits no tenderness.  B/L legs are erythematous with 2+ pitting edema; features of stasis dermatitis w/ weeping on R>L; no purulent drainage or tenderness   Neurological: He is alert and oriented to person, place, and time.  Skin: Skin is warm. He is not diaphoretic.  Psychiatric: He has a normal mood and affect. His behavior is normal.          Assessment & Plan:  Please see problem based assessment and plan.

## 2014-04-16 ENCOUNTER — Other Ambulatory Visit: Payer: Self-pay | Admitting: Internal Medicine

## 2014-04-16 NOTE — Assessment & Plan Note (Addendum)
He has gained 5 pounds since discharge 1 week ago but has no dyspnea, lungs clear.  I suspect this weight gain may be due to discrepancy is hospital and clinic scales w/ lower extremity swelling due to venous insufficiency contributing (though swelling has not increased).   - continue current medications including Lasix 20mg  - caretaker will continue to monitor weight daily - patient has follow-up scheduled with Cardiology in 2 months - RTC in 3 months or sooner if symptoms do not improve or worsen

## 2014-04-16 NOTE — Assessment & Plan Note (Addendum)
Reports compliance with Lasix and slight improvement in swelling since discharge but legs still quite swollen.  He continues to sleep in his recliner.  No purulent drainage, tenderness to suggest cellulitis and B/L skin changes seem more c/w stasis dermatitis.   - continue Lasix 20mg  daily - check BMP (he had a Cr bump w/ diuresis during admission) - I instructed him to keep legs elevated while seated and during sleep; I have asked him to try sleeping in his be again; he needs to keep his legs elevated while in his recliner to assist with venous return - will hold off on compression stockings while he has skin weeping and skin is easily friable - advised to return to clinic if worsening of lower extremity edema, increased weeping, drainage of purulent material, odor or new symptoms; advised to seek emergency care for severe symptoms.

## 2014-04-16 NOTE — Assessment & Plan Note (Signed)
BP Readings from Last 3 Encounters:  04/15/14 132/83  04/08/14 104/62  01/04/14 90/62    Lab Results  Component Value Date   NA 141 04/15/2014   K 5.3 04/15/2014   CREATININE 0.96 04/15/2014    Assessment: Blood pressure control:  controlled Progress toward BP goal:   at goal Comments:   Plan: Medications:  continue current medications Educational resources provided:   Self management tools provided:   Other plans: Cardiology follow-up in 2 months; RTC in 3 months

## 2014-04-19 NOTE — Progress Notes (Signed)
Case discussed with Dr. Wilson at the time of the visit.  We reviewed the resident's history and exam and pertinent patient test results.  I agree with the assessment, diagnosis, and plan of care documented in the resident's note. 

## 2014-04-20 ENCOUNTER — Other Ambulatory Visit: Payer: Self-pay | Admitting: Internal Medicine

## 2014-04-22 ENCOUNTER — Other Ambulatory Visit: Payer: Self-pay

## 2014-04-22 MED ORDER — PRAVASTATIN SODIUM 40 MG PO TABS: 40.0000 mg | ORAL_TABLET | Freq: Every day | ORAL | Status: DC

## 2014-04-24 ENCOUNTER — Emergency Department (HOSPITAL_COMMUNITY)
Admission: EM | Admit: 2014-04-24 | Discharge: 2014-04-24 | Disposition: A | Discharge status: Home / Self Care | Payer: Medicare Other | Attending: Emergency Medicine | Admitting: Emergency Medicine

## 2014-04-24 ENCOUNTER — Emergency Department (HOSPITAL_COMMUNITY): Payer: Medicare Other

## 2014-04-24 ENCOUNTER — Encounter (HOSPITAL_COMMUNITY): Payer: Self-pay | Admitting: Emergency Medicine

## 2014-04-24 DIAGNOSIS — Z9181 History of falling: Secondary | ICD-10-CM | POA: Diagnosis not present

## 2014-04-24 DIAGNOSIS — K219 Gastro-esophageal reflux disease without esophagitis: Secondary | ICD-10-CM | POA: Insufficient documentation

## 2014-04-24 DIAGNOSIS — Z954 Presence of other heart-valve replacement: Secondary | ICD-10-CM | POA: Diagnosis not present

## 2014-04-24 DIAGNOSIS — Z79899 Other long term (current) drug therapy: Secondary | ICD-10-CM | POA: Diagnosis not present

## 2014-04-24 DIAGNOSIS — E785 Hyperlipidemia, unspecified: Secondary | ICD-10-CM | POA: Insufficient documentation

## 2014-04-24 DIAGNOSIS — M19049 Primary osteoarthritis, unspecified hand: Secondary | ICD-10-CM | POA: Insufficient documentation

## 2014-04-24 DIAGNOSIS — Z9861 Coronary angioplasty status: Secondary | ICD-10-CM | POA: Diagnosis not present

## 2014-04-24 DIAGNOSIS — Z880 Allergy status to penicillin: Secondary | ICD-10-CM | POA: Diagnosis not present

## 2014-04-24 DIAGNOSIS — H4089 Other specified glaucoma: Secondary | ICD-10-CM | POA: Insufficient documentation

## 2014-04-24 DIAGNOSIS — Z9981 Dependence on supplemental oxygen: Secondary | ICD-10-CM | POA: Insufficient documentation

## 2014-04-24 DIAGNOSIS — I5032 Chronic diastolic (congestive) heart failure: Secondary | ICD-10-CM | POA: Diagnosis not present

## 2014-04-24 DIAGNOSIS — E039 Hypothyroidism, unspecified: Secondary | ICD-10-CM | POA: Diagnosis not present

## 2014-04-24 DIAGNOSIS — G4733 Obstructive sleep apnea (adult) (pediatric): Secondary | ICD-10-CM | POA: Insufficient documentation

## 2014-04-24 DIAGNOSIS — Z7982 Long term (current) use of aspirin: Secondary | ICD-10-CM | POA: Diagnosis not present

## 2014-04-24 DIAGNOSIS — I2510 Atherosclerotic heart disease of native coronary artery without angina pectoris: Secondary | ICD-10-CM | POA: Diagnosis not present

## 2014-04-24 DIAGNOSIS — I491 Atrial premature depolarization: Secondary | ICD-10-CM | POA: Insufficient documentation

## 2014-04-24 DIAGNOSIS — H3530 Unspecified macular degeneration: Secondary | ICD-10-CM | POA: Diagnosis not present

## 2014-04-24 DIAGNOSIS — R011 Cardiac murmur, unspecified: Secondary | ICD-10-CM | POA: Diagnosis not present

## 2014-04-24 DIAGNOSIS — I10 Essential (primary) hypertension: Secondary | ICD-10-CM | POA: Diagnosis present

## 2014-04-24 DIAGNOSIS — I252 Old myocardial infarction: Secondary | ICD-10-CM | POA: Insufficient documentation

## 2014-04-24 DIAGNOSIS — Z8701 Personal history of pneumonia (recurrent): Secondary | ICD-10-CM | POA: Insufficient documentation

## 2014-04-24 DIAGNOSIS — E669 Obesity, unspecified: Secondary | ICD-10-CM | POA: Diagnosis not present

## 2014-04-24 DIAGNOSIS — Z87891 Personal history of nicotine dependence: Secondary | ICD-10-CM | POA: Insufficient documentation

## 2014-04-24 LAB — I-STAT CHEM 8, ED
BUN: 22 mg/dL (ref 6–23)
CHLORIDE: 105 meq/L (ref 96–112)
Calcium, Ion: 1.22 mmol/L (ref 1.13–1.30)
Creatinine, Ser: 1.2 mg/dL (ref 0.50–1.35)
Glucose, Bld: 109 mg/dL — ABNORMAL HIGH (ref 70–99)
HEMATOCRIT: 45 % (ref 39.0–52.0)
Hemoglobin: 15.3 g/dL (ref 13.0–17.0)
POTASSIUM: 4.4 meq/L (ref 3.7–5.3)
Sodium: 140 mEq/L (ref 137–147)
TCO2: 25 mmol/L (ref 0–100)

## 2014-04-24 LAB — I-STAT TROPONIN, ED: Troponin i, poc: 0.02 ng/mL (ref 0.00–0.08)

## 2014-04-24 NOTE — ED Notes (Signed)
Pt returned from xray

## 2014-04-24 NOTE — Discharge Instructions (Signed)
Heart Failure  Heart failure is a condition in which the heart has trouble pumping blood. This means your heart does not pump blood efficiently for your body to work well. In some cases of heart failure, fluid may back up into your lungs or you may have swelling (edema) in your lower legs. Heart failure is usually a long-term (chronic) condition. It is important for you to take good care of yourself and follow your caregiver's treatment plan.  CAUSES   Some health conditions can cause heart failure. Those health conditions include:  · High blood pressure (hypertension) causes the heart muscle to work harder than normal. When pressure in the blood vessels is high, the heart needs to pump (contract) with more force in order to circulate blood throughout the body. High blood pressure eventually causes the heart to become stiff and weak.  · Coronary artery disease (CAD) is the buildup of cholesterol and fat (plaque) in the arteries of the heart. The blockage in the arteries deprives the heart muscle of oxygen and blood. This can cause chest pain and may lead to a heart attack. High blood pressure can also contribute to CAD.  · Heart attack (myocardial infarction) occurs when 1 or more arteries in the heart become blocked. The loss of oxygen damages the muscle tissue of the heart. When this happens, part of the heart muscle dies. The injured tissue does not contract as well and weakens the heart's ability to pump blood.  · Abnormal heart valves can cause heart failure when the heart valves do not open and close properly. This makes the heart muscle pump harder to keep the blood flowing.  · Heart muscle disease (cardiomyopathy or myocarditis) is damage to the heart muscle from a variety of causes. These can include drug or alcohol abuse, infections, or unknown reasons. These can increase the risk of heart failure.  · Lung disease makes the heart work harder because the lungs do not work properly. This can cause a strain  on the heart, leading it to fail.  · Diabetes increases the risk of heart failure. High blood sugar contributes to high fat (lipid) levels in the blood. Diabetes can also cause slow damage to tiny blood vessels that carry important nutrients to the heart muscle. When the heart does not get enough oxygen and food, it can cause the heart to become weak and stiff. This leads to a heart that does not contract efficiently.  · Other conditions can contribute to heart failure. These include abnormal heart rhythms, thyroid problems, and low blood counts (anemia).  Certain unhealthy behaviors can increase the risk of heart failure. Those unhealthy behaviors include:  · Being overweight.  · Smoking or chewing tobacco.  · Eating foods high in fat and cholesterol.  · Abusing illicit drugs or alcohol.  · Lacking physical activity.  SYMPTOMS   Heart failure symptoms may vary and can be hard to detect. Symptoms may include:  · Shortness of breath with activity, such as climbing stairs.  · Persistent cough.  · Swelling of the feet, ankles, legs, or abdomen.  · Unexplained weight gain.  · Difficulty breathing when lying flat (orthopnea).  · Waking from sleep because of the need to sit up and get more air.  · Rapid heartbeat.  · Fatigue and loss of energy.  · Feeling lightheaded, dizzy, or close to fainting.  · Loss of appetite.  · Nausea.  · Increased urination during the night (nocturia).  DIAGNOSIS   A diagnosis   of heart failure is based on your history, symptoms, physical examination, and diagnostic tests.  Diagnostic tests for heart failure may include:  · Echocardiography.  · Electrocardiography.  · Chest X-ray.  · Blood tests.  · Exercise stress test.  · Cardiac angiography.  · Radionuclide scans.  TREATMENT   Treatment is aimed at managing the symptoms of heart failure. Medicines, behavioral changes, or surgical intervention may be necessary to treat heart failure.  · Medicines to help treat heart failure may  include:  ¨ Angiotensin-converting enzyme (ACE) inhibitors. This type of medicine blocks the effects of a blood protein called angiotensin-converting enzyme. ACE inhibitors relax (dilate) the blood vessels and help lower blood pressure.  ¨ Angiotensin receptor blockers. This type of medicine blocks the actions of a blood protein called angiotensin. Angiotensin receptor blockers dilate the blood vessels and help lower blood pressure.  ¨ Water pills (diuretics). Diuretics cause the kidneys to remove salt and water from the blood. The extra fluid is removed through urination. This loss of extra fluid lowers the volume of blood the heart pumps.  ¨ Beta blockers. These prevent the heart from beating too fast and improve heart muscle strength.  ¨ Digitalis. This increases the force of the heartbeat.  · Healthy behavior changes include:  ¨ Obtaining and maintaining a healthy weight.  ¨ Stopping smoking or chewing tobacco.  ¨ Eating heart healthy foods.  ¨ Limiting or avoiding alcohol.  ¨ Stopping illicit drug use.  ¨ Physical activity as directed by your caregiver.  · Surgical treatment for heart failure may include:  ¨ A procedure to open blocked arteries, repair damaged heart valves, or remove damaged heart muscle tissue.  ¨ A pacemaker to improve heart muscle function and control certain abnormal heart rhythms.  ¨ An internal cardioverter defibrillator to treat certain serious abnormal heart rhythms.  ¨ A left ventricular assist device to assist the pumping ability of the heart.  HOME CARE INSTRUCTIONS   · Take your medicine as directed by your caregiver. Medicines are important in reducing the workload of your heart, slowing the progression of heart failure, and improving your symptoms.  ¨ Do not stop taking your medicine unless directed by your caregiver.  ¨ Do not skip any dose of medicine.  ¨ Refill your prescriptions before you run out of medicine. Your medicines are needed every day.  ¨ Take over-the-counter  medicine only as directed by your caregiver or pharmacist.  · Engage in moderate physical activity if directed by your caregiver. Moderate physical activity can benefit some people. The elderly and people with severe heart failure should consult with a caregiver for physical activity recommendations.  · Eat heart healthy foods. Food choices should be free of trans fat and low in saturated fat, cholesterol, and salt (sodium). Healthy choices include fresh or frozen fruits and vegetables, fish, lean meats, legumes, fat-free or low-fat dairy products, and whole grain or high fiber foods. Talk to a dietitian to learn more about heart healthy foods.  · Limit sodium if directed by your caregiver. Sodium restriction may reduce symptoms of heart failure in some people. Talk to a dietitian to learn more about heart healthy seasonings.  · Use healthy cooking methods. Healthy cooking methods include roasting, grilling, broiling, baking, poaching, steaming, or stir-frying. Talk to a dietitian to learn more about healthy cooking methods.  · Limit fluids if directed by your caregiver. Fluid restriction may reduce symptoms of heart failure in some people.  · Weigh yourself every day.   Daily weights are important in the early recognition of excess fluid. You should weigh yourself every morning after you urinate and before you eat breakfast. Wear the same amount of clothing each time you weigh yourself. Record your daily weight. Provide your caregiver with your weight record.  · Monitor and record your blood pressure if directed by your caregiver.  · Check your pulse if directed by your caregiver.  · Lose weight if directed by your caregiver. Weight loss may reduce symptoms of heart failure in some people.  · Stop smoking or chewing tobacco. Nicotine makes your heart work harder by causing your blood vessels to constrict. Do not use nicotine gum or patches before talking to your caregiver.  · Schedule and attend follow-up visits as  directed by your caregiver. It is important to keep all your appointments.  · Limit alcohol intake to no more than 1 drink per day for nonpregnant women and 2 drinks per day for men. Drinking more than that is harmful to your heart. Tell your caregiver if you drink alcohol several times a week. Talk with your caregiver about whether alcohol is safe for you. If your heart has already been damaged by alcohol or you have severe heart failure, drinking alcohol should be stopped completely.  · Stop illicit drug use.  · Stay up-to-date with immunizations. It is especially important to prevent respiratory infections through current pneumococcal and influenza immunizations.  · Manage other health conditions such as hypertension, diabetes, thyroid disease, or abnormal heart rhythms as directed by your caregiver.  · Learn to manage stress.  · Plan rest periods when fatigued.  · Learn strategies to manage high temperatures. If the weather is extremely hot:  ¨ Avoid vigorous physical activity.  ¨ Use air conditioning or fans or seek a cooler location.  ¨ Avoid caffeine and alcohol.  ¨ Wear loose-fitting, lightweight, and light-colored clothing.  · Learn strategies to manage cold temperatures. If the weather is extremely cold:  ¨ Avoid vigorous physical activity.  ¨ Layer clothes.  ¨ Wear mittens or gloves, a hat, and a scarf when going outside.  ¨ Avoid alcohol.  · Obtain ongoing education and support as needed.  · Participate or seek rehabilitation as needed to maintain or improve independence and quality of life.  SEEK MEDICAL CARE IF:   · Your weight increases by 03 lb/1.4 kg in 1 day or 05 lb/2.3 kg in a week.  · You have increasing shortness of breath that is unusual for you.  · You are unable to participate in your usual physical activities.  · You tire easily.  · You cough more than normal, especially with physical activity.  · You have any or more swelling in areas such as your hands, feet, ankles, or abdomen.  · You  are unable to sleep because it is hard to breathe.  · You feel like your heart is beating fast (palpitations).  · You become dizzy or lightheaded upon standing up.  SEEK IMMEDIATE MEDICAL CARE IF:   · You have difficulty breathing.  · There is a change in mental status such as decreased alertness or difficulty with concentration.  · You have a pain or discomfort in your chest.  · You have an episode of fainting (syncope).  MAKE SURE YOU:   · Understand these instructions.  · Will watch your condition.  · Will get help right away if you are not doing well or get worse.  Document Released: 09/24/2005 Document Revised: 01/19/2013   Document Reviewed: 10/16/2012  [employer]® Patient Information ©2015 [employer]. This information is not intended to replace advice given to you by your health care provider. Make sure you discuss any questions you have with your health care provider.

## 2014-04-24 NOTE — ED Notes (Signed)
Pt states yesterday his blood pressure has been very high for the last two days. States his vision is also blurry in both eyes. Pt is AAOX4, in NAD, sitting up in bed talking in clear sentences.

## 2014-04-24 NOTE — ED Provider Notes (Signed)
CSN: [id number]     Arrival date & time 04/24/14  1429 History   First MD Initiated Contact with Patient 04/24/14 1449     Chief Complaint  Patient presents with  . Hypertension     (Consider location/radiation/quality/duration/timing/severity/associated sxs/prior Treatment) Patient is a 80 y.o. male presenting with hypertension. The history is provided by the patient.  Hypertension This is a recurrent problem. Pertinent negatives include no chest pain, no abdominal pain, no headaches and no shortness of breath.   patient is a history of chronic CHF. He was brought in by his caregiver after being hypertensive. Pressures of 178/115. Patient also states he had some blurred vision with it. States it was as if there was followed over both eyes. He does have a history of macular degeneration right eye. He states his left eye is now normal. No headache. No confusion. No chest pain. His weights have been stable at home. His weight today was 211 pounds. He had been up to 215 pounds no fevers. No headache. No confusion. No abdominal pain.  Past Medical History  Diagnosis Date  . Glaucoma     Right eye  . Diverticulosis     multiple hospital admissions for GI bleed, has an episode roughly every 6 months,, last  colonscopy in 2006 showed diverticulosis, was evaluated by Dr. Madilyn FiremanHayes in the hospital in 2011 for lower GI bleed andd it  was suggested that the patient was not actively bleeding at that time and given his comorbid conditions colonoscopy was deferred. He is also chronically constipated  . Macular degeneration of right eye   . Allergic rhinitis   . CAD (coronary artery disease)     s/p PCI in 1991;  Cardiac catheterization 11/02/11: Ostial LAD 95% with aneurysmal dilatation after this, proximal circumflex 70% with a superior branch 30-40%, proximal RCA 75% and 90%, mid RCA 50% and 90% before a PDA, then occluded.;    high risk for CABG;PCI 11/14/11: Bare-metal stent to the proximal LAD    . OSA on  CPAP   . Hypertension   . Hyperlipidemia   . Chronic systolic heart failure     Echocardiogram 11/02/11: Moderate LVH, EF 30-35%, multiple wall motion abnormalities, critical aortic stenosis, AVA 0.6, mean gradient 45, mild MR, severe LAE  . Supraventricular tachycardia     2 syndromes-nonsustained atrial tachycardia//adenosine responsive diuretic positive reentry probably AV node reentry  . Aortic stenosis     s/p AV balloon valvuloplasty by Dr. [name] 11/2011  . Ischemic cardiomyopathy   . H/O: GI bleed     recurrent  . Heart murmur   . CHF (congestive heart failure)   . Pneumonia 2012  . Hypothyroidism   . GERD (gastroesophageal reflux disease)   . Falls frequently     3 times in the past week/notes 09/11/2013  . Myocardial infarction 1991; 10/28/2011  . Exertional shortness of breath     "not much" (09/11/2013)  . Arthritis     "might have slight; have trouble w/my fingers" (09/11/2013)   Past Surgical History  Procedure Laterality Date  . Glaucoma surgery      Implantation of Baerveldt glaucoma device lant with scleral reinforcement using tutoplast tissue graft right eye. [Other]  . Coronary angioplasty with stent placement  Jan. 23, 2013  . Corneal transplant Right   . Cardiac valve replacement  11/30/2011    aortic valve  . Angioplasty Left 03/30/2013    tibial  . Tonsillectomy  1946  . Cataract extraction w/  intraocular lens  implant, bilateral Bilateral    Family History  Problem Relation Age of Onset  . Cancer Mother   . Cancer Father   . Heart disease Sister     Heart disease before age 44   History  Substance Use Topics  . Smoking status: Former Smoker -- 3.00 packs/day for 21 years    Types: Cigarettes    Quit date: 10/08/1958  . Smokeless tobacco: Never Used  . Alcohol Use: No    Review of Systems  Constitutional: Negative for activity change and appetite change.  Eyes: Positive for visual disturbance. Negative for pain.  Respiratory: Negative for chest  tightness and shortness of breath.   Cardiovascular: Positive for leg swelling. Negative for chest pain.  Gastrointestinal: Negative for nausea, vomiting, abdominal pain and diarrhea.  Genitourinary: Negative for flank pain.  Musculoskeletal: Negative for back pain and neck stiffness.  Skin: Negative for rash.  Neurological: Negative for weakness, numbness and headaches.  Psychiatric/Behavioral: Negative for behavioral problems.      Allergies  Penicillins  Home Medications   Prior to Admission medications   Medication Sig Start Date End Date Taking? Authorizing Provider  amiodarone (PACERONE) 200 MG tablet Take 200 mg by mouth daily.   Yes Historical Provider, MD  aspirin EC 81 MG tablet Take 81 mg by mouth daily.   Yes Historical Provider, MD  Calcium Carb-Cholecalciferol (CALCIUM 600+D) 600-800 MG-UNIT TABS Take 1 tablet by mouth daily.   Yes Historical Provider, MD  ferrous sulfate 325 (65 FE) MG tablet Take 325 mg by mouth daily with breakfast.   Yes Historical Provider, MD  furosemide (LASIX) 20 MG tablet Take 1 tablet (20 mg total) by mouth every morning. 12/24/13  Yes Rollene Rotunda, MD  levothyroxine (SYNTHROID, LEVOTHROID) 25 MCG tablet Take 25 mcg by mouth daily before breakfast.   Yes Historical Provider, MD  losartan (COZAAR) 50 MG tablet Take 50 mg by mouth daily.   Yes Historical Provider, MD  Multiple Vitamin (MULTIVITAMIN) tablet Take 1 tablet by mouth at bedtime.   Yes Historical Provider, MD  oxybutynin (DITROPAN) 5 MG tablet Take 5 mg by mouth daily. 11/11/13  Yes Alejandro Paya, DO  pantoprazole (PROTONIX) 40 MG tablet Take 40 mg by mouth daily.   Yes Historical Provider, MD  pravastatin (PRAVACHOL) 40 MG tablet Take 1 tablet (40 mg total) by mouth daily. 04/22/14  Yes Rollene Rotunda, MD  spironolactone (ALDACTONE) 25 MG tablet Take 12.5 mg by mouth every other day.   Yes Historical Provider, MD  terazosin (HYTRIN) 1 MG capsule Take 1 mg by mouth at bedtime.   Yes  Historical Provider, MD  vitamin B-12 (CYANOCOBALAMIN) 1000 MCG tablet Take 1,000 mcg by mouth daily.   Yes Historical Provider, MD  benzonatate (TESSALON PERLES) 100 MG capsule Take 1 capsule (100 mg total) by mouth 3 (three) times daily as needed for cough. 09/08/13 09/08/14  Genelle Gather, MD   BP 153/91  Pulse 67  Temp(Src) 97.9 F (36.6 C) (Oral)  Resp 16  Ht 5\' 7"  (1.702 m)  Wt 211 lb (95.709 kg)  BMI 33.04 kg/m2  SpO2 96% Physical Exam  Nursing note and vitals reviewed. Constitutional: He is oriented to person, place, and time. He appears well-developed and well-nourished.  Patient is obese  HENT:  Head: Normocephalic and atraumatic.  Eyes: EOM are normal. Pupils are equal, round, and reactive to light.  Neck: Normal range of motion. Neck supple.  Cardiovascular: Normal rate and regular rhythm.  Murmur heard. Systolic murmur  Pulmonary/Chest: Effort normal and breath sounds normal.  Abdominal: Soft. Bowel sounds are normal. He exhibits no distension and no mass. There is no tenderness. There is no rebound and no guarding.  Musculoskeletal: Normal range of motion. He exhibits edema.  Chronic bilateral lower extremity pitting edema with chronic venous changes.  Neurological: He is alert and oriented to person, place, and time. No cranial nerve deficit.  Skin: Skin is warm and dry.  Psychiatric: He has a normal mood and affect.    ED Course  Procedures (including critical care time) Labs Review Labs Reviewed  I-STAT CHEM 8, ED - Abnormal; Notable for the following:    Glucose, Bld 109 (*)    All other components within normal limits  Rosezena Sensor, ED    Imaging Review Dg Chest 2 View  04/24/2014   CLINICAL DATA:  Acute hypertension. Prior history of MI, aortic valve replacement and CHF.  EXAM: CHEST  2 VIEW  COMPARISON:  Two-view chest x-ray 04/07/2014, 11/15/2013, 09/11/2013, 10/01/2012.  FINDINGS: Cardiac silhouette moderately enlarged but stable. Thoracic  aorta tortuous and atherosclerotic, unchanged. Mildly prominent central pulmonary arteries on the right, unchanged. Pulmonary vascularity normal without evidence of pulmonary edema. Lungs clear. No pleural effusions. No pneumothorax. Degenerative changes involving the thoracic spine.  IMPRESSION: Stable cardiomegaly.  No acute cardiopulmonary disease.   Electronically Signed   By: Hulan Saas M.D.   On: 04/24/2014 16:22     EKG Interpretation None      MDM   Final diagnoses:  Chronic diastolic heart failure    Patient presented with hypertension home. Also had some visual blurring. All has resolved. Patient feels better. Basic lab work is stable. X-ray does not show heart failure. Will discharge home followup as needed    Juliet Rude. Rubin Payor, MD 04/24/14 [id number]

## 2014-05-11 ENCOUNTER — Encounter: Payer: Self-pay | Admitting: Family

## 2014-05-12 ENCOUNTER — Ambulatory Visit (INDEPENDENT_AMBULATORY_CARE_PROVIDER_SITE_OTHER): Payer: Medicare Other | Admitting: Family

## 2014-05-12 ENCOUNTER — Encounter: Payer: Self-pay | Admitting: Family

## 2014-05-12 ENCOUNTER — Ambulatory Visit (HOSPITAL_COMMUNITY)
Admission: RE | Admit: 2014-05-12 | Discharge: 2014-05-12 | Disposition: A | Discharge status: Home / Self Care | Payer: Medicare Other | Source: Ambulatory Visit | Attending: Family | Admitting: Family

## 2014-05-12 VITALS — BP 128/86 | HR 69 | Resp 16 | Ht 67.0 in | Wt 203.4 lb

## 2014-05-12 DIAGNOSIS — I739 Peripheral vascular disease, unspecified: Secondary | ICD-10-CM | POA: Insufficient documentation

## 2014-05-12 DIAGNOSIS — I70209 Unspecified atherosclerosis of native arteries of extremities, unspecified extremity: Secondary | ICD-10-CM

## 2014-05-12 DIAGNOSIS — L98499 Non-pressure chronic ulcer of skin of other sites with unspecified severity: Secondary | ICD-10-CM | POA: Diagnosis present

## 2014-05-12 NOTE — Patient Instructions (Addendum)
Peripheral Vascular Disease Peripheral Vascular Disease (PVD), also called Peripheral Arterial Disease (PAD), is a circulation problem caused by cholesterol (atherosclerotic plaque) deposits in the arteries. PVD commonly occurs in the lower extremities (legs) but it can occur in other areas of the body, such as your arms. The cholesterol buildup in the arteries reduces blood flow which can cause pain and other serious problems. The presence of PVD can place a person at risk for Coronary Artery Disease (CAD).  CAUSES  Causes of PVD can be many. It is usually associated with more than one risk factor such as:   High Cholesterol.  Smoking.  Diabetes.  Lack of exercise or inactivity.  High blood pressure (hypertension).  Obesity.  Family history. SYMPTOMS   When the lower extremities are affected, patients with PVD may experience:  Leg pain with exertion or physical activity. This is called INTERMITTENT CLAUDICATION. This may present as cramping or numbness with physical activity. The location of the pain is associated with the level of blockage. For example, blockage at the abdominal level (distal abdominal aorta) may result in buttock or hip pain. Lower leg arterial blockage may result in calf pain.  As PVD becomes more severe, pain can develop with less physical activity.  In people with severe PVD, leg pain may occur at rest.  Other PVD signs and symptoms:  Leg numbness or weakness.  Coldness in the affected leg or foot, especially when compared to the other leg.  A change in leg color.  Patients with significant PVD are more prone to ulcers or sores on toes, feet or legs. These may take longer to heal or may reoccur. The ulcers or sores can become infected.  If signs and symptoms of PVD are ignored, gangrene may occur. This can result in the loss of toes or loss of an entire limb.  Not all leg pain is related to PVD. Other medical conditions can cause leg pain such  as:  Blood clots (embolism) or Deep Vein Thrombosis.  Inflammation of the blood vessels (vasculitis).  Spinal stenosis. DIAGNOSIS  Diagnosis of PVD can involve several different types of tests. These can include:  Pulse Volume Recording Method (PVR). This test is simple, painless and does not involve the use of X-rays. PVR involves measuring and comparing the blood pressure in the arms and legs. An ABI (Ankle-Brachial Index) is calculated. The normal ratio of blood pressures is 1. As this number becomes smaller, it indicates more severe disease.  < 0.95 - indicates significant narrowing in one or more leg vessels.  <0.8 - there will usually be pain in the foot, leg or buttock with exercise.  <0.4 - will usually have pain in the legs at rest.  <0.25 - usually indicates limb threatening PVD.  Doppler detection of pulses in the legs. This test is painless and checks to see if you have a pulses in your legs/feet.  A dye or contrast material (a substance that highlights the blood vessels so they show up on x-ray) may be given to help your caregiver better see the arteries for the following tests. The dye is eliminated from your body by the kidney's. Your caregiver may order blood work to check your kidney function and other laboratory values before the following tests are performed:  Magnetic Resonance Angiography (MRA). An MRA is a picture study of the blood vessels and arteries. The MRA machine uses a large magnet to produce images of the blood vessels.  Computed Tomography Angiography (CTA). A CTA   is a specialized x-ray that looks at how the blood flows in your blood vessels. An IV may be inserted into your arm so contrast dye can be injected.  Angiogram. Is a procedure that uses x-rays to look at your blood vessels. This procedure is minimally invasive, meaning a small incision (cut) is made in your groin. A small tube (catheter) is then inserted into the artery of your groin. The catheter  is guided to the blood vessel or artery your caregiver wants to examine. Contrast dye is injected into the catheter. X-rays are then taken of the blood vessel or artery. After the images are obtained, the catheter is taken out. TREATMENT  Treatment of PVD involves many interventions which may include:  Lifestyle changes:  Quitting smoking.  Exercise.  Following a low fat, low cholesterol diet.  Control of diabetes.  Foot care is very important to the PVD patient. Good foot care can help prevent infection.  Medication:  Cholesterol-lowering medicine.  Blood pressure medicine.  Anti-platelet drugs.  Certain medicines may reduce symptoms of Intermittent Claudication.  Interventional/Surgical options:  Angioplasty. An Angioplasty is a procedure that inflates a balloon in the blocked artery. This opens the blocked artery to improve blood flow.  Stent Implant. A wire mesh tube (stent) is placed in the artery. The stent expands and stays in place, allowing the artery to remain open.  Peripheral Bypass Surgery. This is a surgical procedure that reroutes the blood around a blocked artery to help improve blood flow. This type of procedure may be performed if Angioplasty or stent implants are not an option. SEEK IMMEDIATE MEDICAL CARE IF:   You develop pain or numbness in your arms or legs.  Your arm or leg turns cold, becomes blue in color.  You develop redness, warmth, swelling and pain in your arms or legs. MAKE SURE YOU:   Understand these instructions.  Will watch your condition.  Will get help right away if you are not doing well or get worse. Document Released: 11/01/2004 Document Revised: 12/17/2011 Document Reviewed: 09/28/2008 [facility] Patient Information 2015 [place], [place]. This information is not intended to replace advice given to you by your health care provider. Make sure you discuss any questions you have with your health care provider.    Venous Stasis or  Chronic Venous Insufficiency Chronic venous insufficiency, also called venous stasis, is a condition that affects the veins in the legs. The condition prevents blood from being pumped through these veins effectively. Blood may no longer be pumped effectively from the legs back to the heart. This condition can range from mild to severe. With proper treatment, you should be able to continue with an active life. CAUSES  Chronic venous insufficiency occurs when the vein walls become stretched, weakened, or damaged or when valves within the vein are damaged. Some common causes of this include:  High blood pressure inside the veins (venous hypertension).  Increased blood pressure in the leg veins from long periods of sitting or standing.  A blood clot that blocks blood flow in a vein (deep vein thrombosis).  Inflammation of a superficial vein (phlebitis) that causes a blood clot to form. RISK FACTORS Various things can make you more likely to develop chronic venous insufficiency, including:  Family history of this condition.  Obesity.  Pregnancy.  Sedentary lifestyle.  Smoking.  Jobs requiring long periods of standing or sitting in one place.  Being a certain age. Women in their 87s and 79s and men in their 53s are  more likely to develop this condition. SIGNS AND SYMPTOMS  Symptoms may include:   Varicose veins.  Skin breakdown or ulcers.  Reddened or discolored skin on the leg.  Brown, smooth, tight, and painful skin just above the ankle, usually on the inside surface (lipodermatosclerosis).  Swelling. DIAGNOSIS  To diagnose this condition, your health care provider will take a medical history and do a physical exam. The following tests may be ordered to confirm the diagnosis:  Duplex ultrasound--A procedure that produces a picture of a blood vessel and nearby organs and also provides information on blood flow through the blood vessel.  Plethysmography--A procedure that tests  blood flow.  A venogram, or venography--A procedure used to look at the veins using X-ray and dye. TREATMENT The goals of treatment are to help you return to an active life and to minimize pain or disability. Treatment will depend on the severity of the condition. Medical procedures may be needed for severe cases. Treatment options may include:   Use of compression stockings. These can help with symptoms and lower the chances of the problem getting worse, but they do not cure the problem.  Sclerotherapy--A procedure involving an injection of a material that "dissolves" the damaged veins. Other veins in the network of blood vessels take over the function of the damaged veins.  Surgery to remove the vein or cut off blood flow through the vein (vein stripping or laser ablation surgery).  Surgery to repair a valve. HOME CARE INSTRUCTIONS   Wear compression stockings as directed by your health care provider.  Only take over-the-counter or prescription medicines for pain, discomfort, or fever as directed by your health care provider.  Follow up with your health care provider as directed. SEEK MEDICAL CARE IF:   You have redness, swelling, or increasing pain in the affected area.  You see a red streak or line that extends up or down from the affected area.  You have a breakdown or loss of skin in the affected area, even if the breakdown is small.  You have an injury to the affected area. SEEK IMMEDIATE MEDICAL CARE IF:   You have an injury and open wound in the affected area.  Your pain is severe and does not improve with medicine.  You have sudden numbness or weakness in the foot or ankle below the affected area, or you have trouble moving your foot or ankle.  You have a fever or persistent symptoms for more than 2-3 days.  You have a fever and your symptoms suddenly get worse. MAKE SURE YOU:   Understand these instructions.  Will watch your condition.  Will get help right  away if you are not doing well or get worse. Document Released: 01/28/2007 Document Revised: 07/15/2013 Document Reviewed: 06/01/2013 [facility] Patient Information 2015 [place]. This information is not intended to replace advice given to you by your health care provider. Make sure you discuss any questions you have with your health care provider.  Obtain 20-30 mm mercury graduated knee high compression hose, wear during the day, remove at bedtime. Measure the length of your calf from knee to heel, the largest circumference of your calf, and ankle circumference. Obtain stockings at a pharmacy or medical supply store.

## 2014-05-12 NOTE — Progress Notes (Addendum)
[facility] [place] HISTORY AND PHYSICAL -PAD  History of Present Illness Eugene [name] is a 80 y.o. male patient of Dr. Edilia Bo who originally presented with a nonhealing wound on his left great toe. He underwent an arteriogram was found to have a 95% stenosis of the proximal left anterior tibial artery which was successfully ballooned on 03/30/13. Follow up study showed an ABI of 100% on the left with a biphasic posterior tibial signal and a triphasic anterior tibial signal. The wound on the left toe had healed at that point. Dr. Edilia Bo saw pt on 11/11/13. At that time the patient had some very small wounds in both feet as described above but no cellulitis or drainage.He had biphasic Doppler signals in both feet.Dr. Edilia Bo indicated that he thought patient's swelling in the legs was likely related to his heart failure and he was scheduled to follow up with his cardiologist and primary care physician soon. Pt also had some history of chronic venous insufficiency and for this reason leg elevation would also be helpful. He returns today for evaluation of PAD and venous insufficiency.  Pt denies any history of stroke or TIA. He sees a podiatrist every 3 months.  He does not walk much, uses a walker, states he is unsteady on his feet. He does leg exercises daily while holding on to a counter. He denies any non healing wounds lately, a venous ulcer that he had on his right lower leg has healed according to his friend. He was hospitalized at [facility] recently and was diuresed, lower legs are much less swollen since then according to his friend. Since he has been elevating his legs as much as possible his lower legs have not been as swollen. He denies shortness of breath.  Pt Diabetic: No Pt smoker: former smoker, quit over 50 years ago  Pt meds include: Statin :Yes ASA: Yes Other anticoagulants/antiplatelets: no  Past Medical History  Diagnosis Date  . Glaucoma     Right eye   . Diverticulosis     multiple hospital admissions for GI bleed, has an episode roughly every 6 months,, last  colonscopy in 2006 showed diverticulosis, was evaluated by Dr. Madilyn Fireman in the hospital in 2011 for lower GI bleed andd it  was suggested that the patient was not actively bleeding at that time and given his comorbid conditions colonoscopy was deferred. He is also chronically constipated  . Macular degeneration of right eye   . Allergic rhinitis   . CAD (coronary artery disease)     s/p PCI in 1991;  Cardiac catheterization 11/02/11: Ostial LAD 95% with aneurysmal dilatation after this, proximal circumflex 70% with a superior branch 30-40%, proximal RCA 75% and 90%, mid RCA 50% and 90% before a PDA, then occluded.;    high risk for CABG;PCI 11/14/11: Bare-metal stent to the proximal LAD    . OSA on CPAP   . Hypertension   . Hyperlipidemia   . Chronic systolic heart failure     Echocardiogram 11/02/11: Moderate LVH, EF 30-35%, multiple wall motion abnormalities, critical aortic stenosis, AVA 0.6, mean gradient 45, mild MR, severe LAE  . Supraventricular tachycardia     2 syndromes-nonsustained atrial tachycardia//adenosine responsive diuretic positive reentry probably AV node reentry  . Aortic stenosis     s/p AV balloon valvuloplasty by Dr. Excell Seltzer 11/2011  . Ischemic cardiomyopathy   . H/O: GI bleed     recurrent  . Heart murmur   . CHF (congestive heart failure)   .  Pneumonia 2012  . Hypothyroidism   . GERD (gastroesophageal reflux disease)   . Falls frequently     3 times in the past week/notes 09/11/2013  . Myocardial infarction 1991; 10/28/2011  . Exertional shortness of breath     "not much" (09/11/2013)  . Arthritis     "might have slight; have trouble w/my fingers" (09/11/2013)  . Atrial fibrillation   . Peripheral vascular disease     Social History History  Substance Use Topics  . Smoking status: Former Smoker -- 3.00 packs/day for 21 years    Types: Cigarettes    Quit  date: 10/08/1958  . Smokeless tobacco: Never Used  . Alcohol Use: No    Family History Family History  Problem Relation Age of Onset  . Cancer Mother     Ovarian Cancer  . Cancer Father     Lung  . Heart disease Sister     Heart disease before age 18    Past Surgical History  Procedure Laterality Date  . Glaucoma surgery      Implantation of Baerveldt glaucoma device lant with scleral reinforcement using tutoplast tissue graft right eye. [Other]  . Coronary angioplasty with stent placement  Jan. 23, 2013  . Corneal transplant Right   . Cardiac valve replacement  11/30/2011    aortic valve  . Angioplasty Left 03/30/2013    tibial  . Tonsillectomy  1946  . Cataract extraction w/ intraocular lens  implant, bilateral Bilateral     Allergies  Allergen Reactions  . Penicillins Swelling and Other (See Comments)    REACTION: "turns red"    Current Outpatient Prescriptions  Medication Sig Dispense Refill  . amiodarone (PACERONE) 200 MG tablet Take 200 mg by mouth daily.      Marland Kitchen aspirin EC 81 MG tablet Take 81 mg by mouth daily.      . benzonatate (TESSALON PERLES) 100 MG capsule Take 1 capsule (100 mg total) by mouth 3 (three) times daily as needed for cough.  30 capsule  1  . Calcium Carb-Cholecalciferol (CALCIUM 600+D) 600-800 MG-UNIT TABS Take 1 tablet by mouth daily.      . ferrous sulfate 325 (65 FE) MG tablet Take 325 mg by mouth daily with breakfast.      . furosemide (LASIX) 20 MG tablet Take 1 tablet (20 mg total) by mouth every morning.  30 tablet  6  . levothyroxine (SYNTHROID, LEVOTHROID) 25 MCG tablet Take 25 mcg by mouth daily before breakfast.      . losartan (COZAAR) 50 MG tablet Take 50 mg by mouth daily.      . Multiple Vitamin (MULTIVITAMIN) tablet Take 1 tablet by mouth at bedtime.      Marland Kitchen oxybutynin (DITROPAN) 5 MG tablet Take 5 mg by mouth daily.      . pantoprazole (PROTONIX) 40 MG tablet Take 40 mg by mouth daily.      . pravastatin (PRAVACHOL) 40 MG tablet  Take 1 tablet (40 mg total) by mouth daily.  30 tablet  3  . spironolactone (ALDACTONE) 25 MG tablet Take 12.5 mg by mouth every other day.      . terazosin (HYTRIN) 1 MG capsule Take 1 mg by mouth at bedtime.      . vitamin B-12 (CYANOCOBALAMIN) 1000 MCG tablet Take 1,000 mcg by mouth daily.       No current facility-administered medications for this visit.    ROS: See HPI for pertinent positives and negatives.   Physical Examination  Filed Vitals:   05/12/14 1628  BP: 128/86  Pulse: 69  Resp: 16  Height: 5\' 7"  (1.702 m)  Weight: 203 lb 6.4 oz (92.262 kg)  SpO2: 94%   Body mass index is 31.85 kg/(m^2).  General: A&O x 3, WDWN, obese male. Gait: slow and deliberate, using walker Eyes: PERRLA. Pulmonary: CTAB but with little air movement detected, without wheezes , rales or rhonchi. Cardiac: regular Rythm , without detected murmur.         Carotid Bruits Right Left   Negative Negative  Aorta is not palpable. Radial pulses: are 2+ palpable and =                           VASCULAR EXAM: Extremities without ischemic changes  without Gangrene; without open wounds. He does have chronic venous stasis changes in the skin of his lower legs with dry skin and evidence of healed ulcers. Right lower leg has 2+ pitting edema, left has 1+ pitting edema.                                                                                                          LE Pulses Right Left       FEMORAL  not palpable (seated)  not palpable (seated)        POPLITEAL  not palpable   not palpable       POSTERIOR TIBIAL  not palpable   not palpable        DORSALIS PEDIS      ANTERIOR TIBIAL  palpable   palpable    Abdomen: soft, NT, no masses. Skin: see extremities, no ulcers noted. Musculoskeletal: age appropriate muscle wasting and atrophy.  Neurologic: A&O X 3; Appropriate Affect ; SENSATION: normal; MOTOR FUNCTION:  moving all extremities equally, motor strength 5/5 in upper extremities,  3/5 in lower extremities. Speech is fluent/normal. CN 2-12 grossly intact except he is hard of hearing with hearing aids in place   Non-Invasive Vascular Imaging: DATE: 05/12/2014 ABI: RIGHT 1.08, TBI: 0.88, Waveforms: triphasic;  LEFT 1.10, TBI: 0.77, Waveforms: bi and triphasic.   ASSESSMENT: JKAI ARWOOD is a 80 y.o. male who is s/p left anterior tibial artery angioplasty/PTA on 03/30/13. ABI's and TBI's are normal today. He has no non healing arterial ulcers nor venous stasis ulcers. He was recently hospitalized for exacerbation of CHF, was diuresed, and his friend states that pt's lower legs swelling has decreased significantly since then and since pt has been elevating his legs in his recliner.   PLAN:  Graduated knee high compression hose, 20-30 mm Hg, measure to fit, wear during the day. Continue elevating legs as much as ;possible. Based on the patient's vascular studies and examination, pt will return to clinic in 1 year for ABI's. After his next visit in a year, if he remains stable and after discussing with Dr. Edilia Bo, will advise to return as needed.  The patient was given information about PAD including signs, symptoms, treatment, what symptoms should prompt the patient  to seek immediate medical care, and risk reduction measures to take. He was also given information about venous insufficiency and advised to wear knee high graduated compression hose.   Charisse MarchSuzanne Clydean Posas, RN, MSN, FNP-C [facility] [place] Office Phone: [phone]  Clinic MD: Edilia BoDickson  05/12/2014 4:36 PM

## 2014-06-01 ENCOUNTER — Other Ambulatory Visit: Payer: Self-pay | Admitting: Internal Medicine

## 2014-06-03 ENCOUNTER — Other Ambulatory Visit: Payer: Self-pay | Admitting: Internal Medicine

## 2014-06-04 ENCOUNTER — Other Ambulatory Visit: Payer: Self-pay | Admitting: Internal Medicine

## 2014-06-04 NOTE — Telephone Encounter (Signed)
Thank you Eugene [name]... This would not be an appropriate refill for him

## 2014-06-04 NOTE — Telephone Encounter (Signed)
Pt's caregiver called and asked about refill.   He states pt is taking this med to help him sleep more than for cough.   I advised him to make an appointment in clinic and let us examine pt and see what would be best for him.  Tessalon is not for sleep. He will talk with pt and call back

## 2014-06-06 ENCOUNTER — Emergency Department (HOSPITAL_COMMUNITY)
Admission: EM | Admit: 2014-06-06 | Discharge: 2014-06-06 | Disposition: A | Discharge status: Home / Self Care | Payer: Medicare Other | Attending: Emergency Medicine | Admitting: Emergency Medicine

## 2014-06-06 ENCOUNTER — Encounter (HOSPITAL_COMMUNITY): Payer: Self-pay | Admitting: Emergency Medicine

## 2014-06-06 DIAGNOSIS — K219 Gastro-esophageal reflux disease without esophagitis: Secondary | ICD-10-CM | POA: Diagnosis not present

## 2014-06-06 DIAGNOSIS — E039 Hypothyroidism, unspecified: Secondary | ICD-10-CM | POA: Insufficient documentation

## 2014-06-06 DIAGNOSIS — R42 Dizziness and giddiness: Secondary | ICD-10-CM | POA: Diagnosis present

## 2014-06-06 DIAGNOSIS — G4733 Obstructive sleep apnea (adult) (pediatric): Secondary | ICD-10-CM | POA: Insufficient documentation

## 2014-06-06 DIAGNOSIS — Z9181 History of falling: Secondary | ICD-10-CM | POA: Diagnosis not present

## 2014-06-06 DIAGNOSIS — I252 Old myocardial infarction: Secondary | ICD-10-CM | POA: Diagnosis not present

## 2014-06-06 DIAGNOSIS — Z9861 Coronary angioplasty status: Secondary | ICD-10-CM | POA: Insufficient documentation

## 2014-06-06 DIAGNOSIS — R011 Cardiac murmur, unspecified: Secondary | ICD-10-CM | POA: Insufficient documentation

## 2014-06-06 DIAGNOSIS — Z79899 Other long term (current) drug therapy: Secondary | ICD-10-CM | POA: Insufficient documentation

## 2014-06-06 DIAGNOSIS — I2510 Atherosclerotic heart disease of native coronary artery without angina pectoris: Secondary | ICD-10-CM | POA: Insufficient documentation

## 2014-06-06 DIAGNOSIS — Z880 Allergy status to penicillin: Secondary | ICD-10-CM | POA: Insufficient documentation

## 2014-06-06 DIAGNOSIS — I951 Orthostatic hypotension: Secondary | ICD-10-CM | POA: Insufficient documentation

## 2014-06-06 DIAGNOSIS — Z87891 Personal history of nicotine dependence: Secondary | ICD-10-CM | POA: Insufficient documentation

## 2014-06-06 DIAGNOSIS — I5022 Chronic systolic (congestive) heart failure: Secondary | ICD-10-CM | POA: Insufficient documentation

## 2014-06-06 DIAGNOSIS — M129 Arthropathy, unspecified: Secondary | ICD-10-CM | POA: Diagnosis not present

## 2014-06-06 DIAGNOSIS — Z8701 Personal history of pneumonia (recurrent): Secondary | ICD-10-CM | POA: Diagnosis not present

## 2014-06-06 DIAGNOSIS — Z7982 Long term (current) use of aspirin: Secondary | ICD-10-CM | POA: Insufficient documentation

## 2014-06-06 DIAGNOSIS — E785 Hyperlipidemia, unspecified: Secondary | ICD-10-CM | POA: Insufficient documentation

## 2014-06-06 LAB — URINALYSIS, ROUTINE W REFLEX MICROSCOPIC
BILIRUBIN URINE: NEGATIVE
Glucose, UA: NEGATIVE mg/dL
Hgb urine dipstick: NEGATIVE
Ketones, ur: NEGATIVE mg/dL
NITRITE: NEGATIVE
Protein, ur: NEGATIVE mg/dL
SPECIFIC GRAVITY, URINE: 1.019 (ref 1.005–1.030)
Urobilinogen, UA: 0.2 mg/dL (ref 0.0–1.0)
pH: 5 (ref 5.0–8.0)

## 2014-06-06 LAB — BASIC METABOLIC PANEL
ANION GAP: 11 (ref 5–15)
BUN: 22 mg/dL (ref 6–23)
CHLORIDE: 103 meq/L (ref 96–112)
CO2: 28 meq/L (ref 19–32)
Calcium: 9.7 mg/dL (ref 8.4–10.5)
Creatinine, Ser: 1.16 mg/dL (ref 0.50–1.35)
GFR calc Af Amer: 63 mL/min — ABNORMAL LOW (ref >90)
GFR calc non Af Amer: 55 mL/min — ABNORMAL LOW (ref >90)
GLUCOSE: 96 mg/dL (ref 70–99)
POTASSIUM: 5.1 meq/L (ref 3.7–5.3)
SODIUM: 142 meq/L (ref 137–147)

## 2014-06-06 LAB — URINE MICROSCOPIC-ADD ON

## 2014-06-06 LAB — CBC
HCT: 48.3 % (ref 39.0–52.0)
HEMOGLOBIN: 16.5 g/dL (ref 13.0–17.0)
MCH: 31.7 pg (ref 26.0–34.0)
MCHC: 34.2 g/dL (ref 30.0–36.0)
MCV: 92.7 fL (ref 78.0–100.0)
PLATELETS: 180 10*3/uL (ref 150–400)
RBC: 5.21 MIL/uL (ref 4.22–5.81)
RDW: 14.6 % (ref 11.5–15.5)
WBC: 7.9 10*3/uL (ref 4.0–10.5)

## 2014-06-06 LAB — I-STAT TROPONIN, ED: Troponin i, poc: 0.02 ng/mL (ref 0.00–0.08)

## 2014-06-06 MED ORDER — SODIUM CHLORIDE 0.9 % IV BOLUS (SEPSIS)
250.0000 mL | Freq: Once | INTRAVENOUS | Status: AC
  Administered 2014-06-06: 250 mL via INTRAVENOUS

## 2014-06-06 NOTE — ED Provider Notes (Signed)
CSN: [id number]     Arrival date & time 06/06/14  1414 History   First MD Initiated Contact with Patient 06/06/14 1530     Chief Complaint  Patient presents with  . Dizziness  . Hypotension     (Consider location/radiation/quality/duration/timing/severity/associated sxs/prior Treatment) HPI Comments: Patient presents to the ER for evaluation of dizziness. Patient reports that he was feeling fine this morning when he woke up. He wanted to try when he came home, he started to feel dizziness and lightheaded. He checked his blood pressure and it was 97 systolic. At that time he was feeling like he couldn't focus well, did not pass out. No headache, chest pain, palpitations, shortness of breath. He has not had any recent illness, denies nausea, vomiting, diarrhea. At time of evaluation in the ER, symptoms have resolved. He feels like he is back to his normal baseline.  Patient is a 80 y.o. male presenting with dizziness.  Dizziness Associated symptoms: no chest pain, no palpitations and no shortness of breath     Past Medical History  Diagnosis Date  . Glaucoma     Right eye  . Diverticulosis     multiple hospital admissions for GI bleed, has an episode roughly every 6 months,, last  colonscopy in 2006 showed diverticulosis, was evaluated by Dr. Madilyn Fireman in the hospital in 2011 for lower GI bleed andd it  was suggested that the patient was not actively bleeding at that time and given his comorbid conditions colonoscopy was deferred. He is also chronically constipated  . Macular degeneration of right eye   . Allergic rhinitis   . CAD (coronary artery disease)     s/p PCI in 1991;  Cardiac catheterization 11/02/11: Ostial LAD 95% with aneurysmal dilatation after this, proximal circumflex 70% with a superior branch 30-40%, proximal RCA 75% and 90%, mid RCA 50% and 90% before a PDA, then occluded.;    high risk for CABG;PCI 11/14/11: Bare-metal stent to the proximal LAD    . OSA on CPAP   .  Hypertension   . Hyperlipidemia   . Chronic systolic heart failure     Echocardiogram 11/02/11: Moderate LVH, EF 30-35%, multiple wall motion abnormalities, critical aortic stenosis, AVA 0.6, mean gradient 45, mild MR, severe LAE  . Supraventricular tachycardia     2 syndromes-nonsustained atrial tachycardia//adenosine responsive diuretic positive reentry probably AV node reentry  . Aortic stenosis     s/p AV balloon valvuloplasty by Dr. Excell Seltzer 11/2011  . Ischemic cardiomyopathy   . H/O: GI bleed     recurrent  . Heart murmur   . CHF (congestive heart failure)   . Pneumonia 2012  . Hypothyroidism   . GERD (gastroesophageal reflux disease)   . Falls frequently     3 times in the past week/notes 09/11/2013  . Myocardial infarction 1991; 10/28/2011  . Exertional shortness of breath     "not much" (09/11/2013)  . Arthritis     "might have slight; have trouble w/my fingers" (09/11/2013)  . Atrial fibrillation   . Peripheral vascular disease    Past Surgical History  Procedure Laterality Date  . Glaucoma surgery      Implantation of Baerveldt glaucoma device lant with scleral reinforcement using tutoplast tissue graft right eye. [Other]  . Coronary angioplasty with stent placement  Jan. 23, 2013  . Corneal transplant Right   . Cardiac valve replacement  11/30/2011    aortic valve  . Angioplasty Left 03/30/2013    tibial  .  Tonsillectomy  1946  . Cataract extraction w/ intraocular lens  implant, bilateral Bilateral    Family History  Problem Relation Age of Onset  . Cancer Mother     Ovarian Cancer  . Cancer Father     Lung  . Heart disease Sister     Heart disease before age 28   History  Substance Use Topics  . Smoking status: Former Smoker -- 3.00 packs/day for 21 years    Types: Cigarettes    Quit date: 10/08/1958  . Smokeless tobacco: Never Used  . Alcohol Use: No    Review of Systems  Respiratory: Negative for shortness of breath.   Cardiovascular: Negative for chest  pain and palpitations.  Neurological: Positive for dizziness.  All other systems reviewed and are negative.     Allergies  Penicillins  Home Medications   Prior to Admission medications   Medication Sig Start Date End Date Taking? Authorizing Provider  amiodarone (PACERONE) 200 MG tablet Take 200 mg by mouth daily.    Historical Provider, MD  aspirin EC 81 MG tablet Take 81 mg by mouth daily.    Historical Provider, MD  benzonatate (TESSALON PERLES) 100 MG capsule Take 1 capsule (100 mg total) by mouth 3 (three) times daily as needed for cough. 09/08/13 09/08/14  Genelle Gather, MD  Calcium Carb-Cholecalciferol (CALCIUM 600+D) 600-800 MG-UNIT TABS Take 1 tablet by mouth daily.    Historical Provider, MD  ferrous sulfate 325 (65 FE) MG tablet Take 325 mg by mouth daily with breakfast.    Historical Provider, MD  furosemide (LASIX) 20 MG tablet Take 1 tablet (20 mg total) by mouth every morning. 12/24/13   Rollene Rotunda, MD  levothyroxine (SYNTHROID, LEVOTHROID) 25 MCG tablet Take 25 mcg by mouth daily before breakfast.    Historical Provider, MD  losartan (COZAAR) 50 MG tablet Take 50 mg by mouth daily.    Historical Provider, MD  Multiple Vitamin (MULTIVITAMIN) tablet Take 1 tablet by mouth at bedtime.    Historical Provider, MD  oxybutynin (DITROPAN) 5 MG tablet Take 5 mg by mouth daily. 11/11/13   Jonah Blue, DO  pantoprazole (PROTONIX) 40 MG tablet Take 40 mg by mouth daily.    Historical Provider, MD  pravastatin (PRAVACHOL) 40 MG tablet Take 1 tablet (40 mg total) by mouth daily. 04/22/14   Rollene Rotunda, MD  spironolactone (ALDACTONE) 25 MG tablet Take 12.5 mg by mouth every other day.    Historical Provider, MD  terazosin (HYTRIN) 1 MG capsule Take 1 mg by mouth at bedtime.    Historical Provider, MD  vitamin B-12 (CYANOCOBALAMIN) 1000 MCG tablet Take 1,000 mcg by mouth daily.    Historical Provider, MD   BP 143/98  Pulse 104  Temp(Src) 98.6 F (37 C) (Oral)  Resp 18  SpO2  95% Physical Exam  Constitutional: He is oriented to person, place, and time. He appears well-developed and well-nourished. No distress.  HENT:  Head: Normocephalic and atraumatic.  Right Ear: Hearing normal.  Left Ear: Hearing normal.  Nose: Nose normal.  Mouth/Throat: Oropharynx is clear and moist and mucous membranes are normal.  Eyes: Conjunctivae and EOM are normal. Pupils are equal, round, and reactive to light.  Neck: Normal range of motion. Neck supple.  Cardiovascular: Regular rhythm, S1 normal and S2 normal.  Exam reveals no gallop and no friction rub.   No murmur heard. Pulmonary/Chest: Effort normal and breath sounds normal. No respiratory distress. He exhibits no tenderness.  Abdominal: Soft.  Normal appearance and bowel sounds are normal. There is no hepatosplenomegaly. There is no tenderness. There is no rebound, no guarding, no tenderness at McBurney's point and negative Murphy's sign. No hernia.  Musculoskeletal: Normal range of motion.  Neurological: He is alert and oriented to person, place, and time. He has normal strength. No cranial nerve deficit or sensory deficit. Coordination normal. GCS eye subscore is 4. GCS verbal subscore is 5. GCS motor subscore is 6.  Extraocular muscle movement: normal No visual field cut Pupils: equal and reactive both direct and consensual response is normal No nystagmus present Sensory function is intact to light touch, pinprick Proprioception intact  Grip strength 5/5 symmetric in upper extremities Lower extremity strength 5/5 against gravity No pronator drift   Skin: Skin is warm, dry and intact. No rash noted. No cyanosis.  Psychiatric: He has a normal mood and affect. His speech is normal and behavior is normal. Thought content normal.    ED Course  Procedures (including critical care time) Labs Review Labs Reviewed  CBC  BASIC METABOLIC PANEL  URINALYSIS, ROUTINE W REFLEX MICROSCOPIC  I-STAT TROPOININ, ED    Imaging  Review No results found.   EKG Interpretation   Date/Time:  Sunday June 06 2014 14:30:23 EDT Ventricular Rate:  89 PR Interval:  282 QRS Duration: 128 QT Interval:  374 QTC Calculation: 455 R Axis:   -72 Text Interpretation:  Sinus rhythm with 1st degree A-V block with  Premature atrial complexes with Abberant conduction Left axis deviation  Left ventricular hypertrophy with QRS widening and repolarization  abnormality Abnormal ECG No significant change since last tracing  Confirmed by Anacarolina Evelyn  MD, Belenda Alviar ([id number]) on 06/06/2014 3:34:29 PM      MDM   Final diagnoses:  None   dizziness  Transient hypotension  Patient presents to the ER for evaluation of dizziness that occurred earlier today, has not resolved. The dizziness was accompanied by hypotension, patient did take his blood pressure at home and he had a systolic blood pressure below 604. Blood pressure is currently normal for him. His symptoms have completely resolved. He has a normal neurologic exam. Patient never had any cardiac symptoms. He denies any headache, blurred vision, does not require any neuroimaging at this time. Patient likely had a mild vasovagal episode resulting in the dizziness and his blood pressure is back to normal, his symptoms have resolved. No further interventions are necessary. Patient will be discharged to follow up with his doctor as needed. Return for any recurrent symptoms.    Gilda Crease, MD 06/06/14 [phone]

## 2014-06-06 NOTE — Discharge Instructions (Signed)
Dizziness °Dizziness is a common problem. It is a feeling of unsteadiness or light-headedness. You may feel like you are about to faint. Dizziness can lead to injury if you stumble or fall. A person of any age group can suffer from dizziness, but dizziness is more common in older adults. °CAUSES  °Dizziness can be caused by many different things, including: °· Middle ear problems. °· Standing for too long. °· Infections. °· An allergic reaction. °· Aging. °· An emotional response to something, such as the sight of blood. °· Side effects of medicines. °· Tiredness. °· Problems with circulation or blood pressure. °· Excessive use of alcohol or medicines, or illegal drug use. °· Breathing too fast (hyperventilation). °· An irregular heart rhythm (arrhythmia). °· A low red blood cell count (anemia). °· Pregnancy. °· Vomiting, diarrhea, fever, or other illnesses that cause body fluid loss (dehydration). °· Diseases or conditions such as Parkinson's disease, high blood pressure (hypertension), diabetes, and thyroid problems. °· Exposure to extreme heat. °DIAGNOSIS  °Your health care provider will ask about your symptoms, perform a physical exam, and perform an electrocardiogram (ECG) to record the electrical activity of your heart. Your health care provider may also perform other heart or blood tests to determine the cause of your dizziness. These may include: °· Transthoracic echocardiogram (TTE). During echocardiography, sound waves are used to evaluate how blood flows through your heart. °· Transesophageal echocardiogram (TEE). °· Cardiac monitoring. This allows your health care provider to monitor your heart rate and rhythm in real time. °· Holter monitor. This is a portable device that records your heartbeat and can help diagnose heart arrhythmias. It allows your health care provider to track your heart activity for several days if needed. °· Stress tests by exercise or by giving medicine that makes the heart beat  faster. °TREATMENT  °Treatment of dizziness depends on the cause of your symptoms and can vary greatly. °HOME CARE INSTRUCTIONS  °· Drink enough fluids to keep your urine clear or pale yellow. This is especially important in very hot weather. In older adults, it is also important in cold weather. °· Take your medicine exactly as directed if your dizziness is caused by medicines. When taking blood pressure medicines, it is especially important to get up slowly. °¨ Rise slowly from chairs and steady yourself until you feel okay. °¨ In the morning, first sit up on the side of the bed. When you feel okay, stand slowly while holding onto something until you know your balance is fine. °· Move your legs often if you need to stand in one place for a long time. Tighten and relax your muscles in your legs while standing. °· Have someone stay with you for 1-2 days if dizziness continues to be a problem. Do this until you feel you are well enough to stay alone. Have the person call your health care provider if he or she notices changes in you that are concerning. °· Do not drive or use heavy machinery if you feel dizzy. °· Do not drink alcohol. °SEEK IMMEDIATE MEDICAL CARE IF:  °· Your dizziness or light-headedness gets worse. °· You feel nauseous or vomit. °· You have problems talking, walking, or using your arms, hands, or legs. °· You feel weak. °· You are not thinking clearly or you have trouble forming sentences. It may take a friend or family member to notice this. °· You have chest pain, abdominal pain, shortness of breath, or sweating. °· Your vision changes. °· You notice   any bleeding. °· You have side effects from medicine that seems to be getting worse rather than better. °MAKE SURE YOU:  °· Understand these instructions. °· Will watch your condition. °· Will get help right away if you are not doing well or get worse. °Document Released: 03/20/2001 Document Revised: 09/29/2013 Document Reviewed: 04/13/2011 °ExitCare®  Patient Information ©2015 [employer]. This information is not intended to replace advice given to you by your health care provider. Make sure you discuss any questions you have with your health care provider. °Orthostatic Hypotension °Orthostatic hypotension is a sudden drop in blood pressure. It happens when you quickly stand up from a seated or lying position. You may feel dizzy or light-headed. This can last for just a few seconds or for up to a few minutes. It is usually not a serious problem. However, if this happens frequently or gets worse, it can be a sign of something more serious. °CAUSES  °Different things can cause orthostatic hypotension, including:  °· Loss of body fluids (dehydration). °· Medicines that lower blood pressure. °· Sudden changes in posture, such as standing up quickly after you have been sitting or lying down. °· Taking too much of your medicine. °SIGNS AND SYMPTOMS  °· Light-headedness or dizziness.   °· Fainting or near-fainting.   °· A fast heart rate.   °· Weakness.   °· Feeling tired (fatigue).   °DIAGNOSIS  °Your health care provider may do several things to help diagnose your condition and identify the cause. These may include:  °· Taking a medical history and doing a physical exam. °· Checking your blood pressure. Your health care provider will check your blood pressure when you are: °¨ Lying down. °¨ Sitting. °¨ Standing. °· Using tilt table testing. In this test, you lie down on a table that moves from a lying position to a standing position. You will be strapped onto the table. This test monitors your blood pressure and heart rate when you are in different positions. °TREATMENT  °Treatment will vary depending on the cause. Possible treatments include:  °· Changing the dosage of your medicines.  °· Wearing compression stockings on your lower legs. °· Standing up slowly after sitting or lying down. °· Eating more salt. °· Eating frequent, small meals. °· In some cases,  getting IV fluids. °· Taking medicine to enhance fluid retention. °HOME CARE INSTRUCTIONS °· Only take over-the-counter or prescription medicines as directed by your health care provider. °¨ Follow your health care provider's instructions for changing the dosage of your current medicines. °¨ Do not stop or adjust your medicine on your own. °· Stand up slowly after sitting or lying down. This allows your body to adjust to the different position. °· Wear compression stockings as directed. °· Eat extra salt as directed. °¨ Do not add extra salt to your diet unless directed to by your health care provider. °· Eat frequent, small meals. °· Avoid standing suddenly after eating. °· Avoid hot showers or excessive heat as directed by your health care provider. °· Keep all follow-up appointments. °SEEK MEDICAL CARE IF: °· You continue to feel dizzy or light-headed after standing. °· You feel groggy or confused. °· You feel cold, clammy, or sick to your stomach (nauseous). °· You have blurred vision. °· You feel short of breath. °SEEK IMMEDIATE MEDICAL CARE IF:  °· You faint after standing. °· You have chest pain.  °· You have difficulty breathing.   °· You lose feeling or movement in your arms or legs.   °· You have slurred   speech or difficulty talking, or you are unable to talk.   °MAKE SURE YOU:  °· Understand these instructions. °· Will watch your condition. °· Will get help right away if you are not doing well or get worse. °Document Released: 09/14/2002 Document Revised: 09/29/2013 Document Reviewed: 07/17/2013 °ExitCare® Patient Information ©2015 [employer]. This information is not intended to replace advice given to you by your health care provider. Make sure you discuss any questions you have with your health care provider. ° °

## 2014-06-06 NOTE — ED Notes (Signed)
Reports that he had episode of dizziness and hypotension this am, went to church and then checked his bp was low sbp <99. Pt feels lightheaded and like he cant focus. No neuro deficits noted at triage, grips are equal, no arm drift, no facial droop noted. bp is 139/100.

## 2014-06-08 LAB — URINE CULTURE

## 2014-06-11 ENCOUNTER — Encounter: Payer: Self-pay | Admitting: Internal Medicine

## 2014-06-11 ENCOUNTER — Ambulatory Visit (INDEPENDENT_AMBULATORY_CARE_PROVIDER_SITE_OTHER): Payer: Medicare Other | Admitting: Internal Medicine

## 2014-06-11 VITALS — BP 138/80 | HR 70 | Temp 98.1°F | Ht 67.0 in | Wt 208.1 lb

## 2014-06-11 DIAGNOSIS — R058 Other specified cough: Secondary | ICD-10-CM

## 2014-06-11 DIAGNOSIS — I872 Venous insufficiency (chronic) (peripheral): Secondary | ICD-10-CM

## 2014-06-11 DIAGNOSIS — I10 Essential (primary) hypertension: Secondary | ICD-10-CM

## 2014-06-11 DIAGNOSIS — R059 Cough, unspecified: Secondary | ICD-10-CM

## 2014-06-11 DIAGNOSIS — Z23 Encounter for immunization: Secondary | ICD-10-CM

## 2014-06-11 DIAGNOSIS — Z0000 Encounter for general adult medical examination without abnormal findings: Secondary | ICD-10-CM

## 2014-06-11 DIAGNOSIS — R05 Cough: Secondary | ICD-10-CM

## 2014-06-11 NOTE — Patient Instructions (Signed)
General Instructions: -If you are not able to eat or drink make sure to hold your diuretics (spironolactone, Lasix) and your night time blood pressure medicine (tarazosin) for that day as they may cause your blood pressure to become too low.  -Continue to keep your legs elevated to prevent swelling.  -You may take lozenges such as Ricola for your dry cough at night.  -Follow with Dr. Heide Spark in 1-2 months for blood pressure recheck.   Please bring your medicines with you each time you come.   Medicines may be  Eye drops  Herbal   Vitamins  Pills  Seeing these help [place] take care of you.   Treatment Goals:  Goals (1 Years of Data) as of 06/11/14         As of Today 05/12/14 04/24/14 04/15/14 04/08/14     Lifestyle    . Family to eat at dinner table more often           Weight    . Weight < 200 lb (90.719 kg)  208 lb 1.6 oz (94.394 kg) 203 lb 6.4 oz (92.262 kg) 211 lb (95.709 kg) 213 lb 6.4 oz (96.798 kg) 208 lb 5.4 oz (94.5 kg)      Progress Toward Treatment Goals:  Treatment Goal 06/11/2014  Blood pressure at goal  Prevent falls at goal    Self Care Goals & Plans:  Self Care Goal 06/11/2014  Manage my medications take my medicines as prescribed; bring my medications to every visit; refill my medications on time  Monitor my health keep track of my blood pressure  Eat healthy foods eat foods that are low in salt; eat baked foods instead of fried foods; eat more vegetables  Be physically active -  Prevent falls have my vision checked; wear appropriate shoes    No flowsheet data found.   Care Management & Community Referrals:  Referral 06/11/2014  Referrals made for care management support none needed  Referrals made to community resources -

## 2014-06-11 NOTE — Progress Notes (Signed)
   Subjective:    Patient ID: Eugene [name], male    DOB: 1924/02/13, 80 y.o.   MRN: [id number]  HPI Eugene [name] is an 80 year old with a PMH of CAD (s/p LAD stent 2013), dHF (EF 60-65% June 2014), aortic stenosis (s/p balloon valvuloplasty 2013), PAF, PAD, HTN, OSA on CPAP, chronic venous insufficiency, who presents accompanied by his friend Kathlene November, for ED follow up visit.  He was seen in the ED last week for orthostatic hypotension in the setting of missing lunch that day. He states that he was waiting to go out with a friend for lunch but she was very late, coming in at 2:30 PM and he had only eaten an early breakfast and a few crackers. He was given a small bolus of NS , was instructed to hold his diuretics and his terazosin at night and felt much better the next morning. He denies dizziness since then and he continues to tolerate intake per mouth well.  His leg swelling has much improved since his last visit at [facility] as he now keeps his legs elevated during the day and takes his Lasix  daily. His weight is down to 208lbs (was 219lbs earlier this year). He denies DOE or SOB at rest. He has no more weeping from the skin and does not feel the need to weak compression stocking.  Finally, he complains of a dry cough, especially at night when wearing his CPAP that improved with a lozenges prior to putting his mask on. He is concerned about this cough though, as he saw one of his close friends die of PNA in the hospital and he wonders if he needs refill for Tessalon perls to make the cough go away.   Review of Systems  Constitutional: Negative for fever, chills, activity change, appetite change and unexpected weight change.  Respiratory: Positive for cough. Negative for shortness of breath and wheezing.   Cardiovascular: Positive for leg swelling. Negative for chest pain and palpitations.  Gastrointestinal: Negative for abdominal pain.  Genitourinary: Negative for dysuria.  Neurological:  Negative for dizziness, weakness and light-headedness.       Objective:   Physical Exam  Nursing note and vitals reviewed. Constitutional: He is oriented to person, place, and time. No distress.  Morbidly obese, sitting in wheelchair   HENT:  Wearing glasses   Eyes: Conjunctivae are normal. No scleral icterus.  Cardiovascular: Normal rate.   Pulmonary/Chest: Effort normal. No respiratory distress. He has no wheezes. He has no rales.  Abdominal: Soft. There is no tenderness.  Musculoskeletal: He exhibits edema. He exhibits no tenderness.  1+ pitting edema bilaterally up to his mid shins  Neurological: He is alert and oriented to person, place, and time.  Skin: Skin is warm and dry. He is not diaphoretic.  Bilateral lower extremity with chronic venous statis skin changes.  Anterior right shin with small ~1cm in diameter area of superficial skin abrasion with no active bleeding/swelling/discharge.   Psychiatric: He has a normal mood and affect.          Assessment & Plan:

## 2014-06-12 NOTE — Assessment & Plan Note (Signed)
He received flu vaccine during this visit

## 2014-06-12 NOTE — Assessment & Plan Note (Addendum)
BP Readings from Last 3 Encounters:  06/11/14 138/80  06/06/14 103/79  05/12/14 128/86    Lab Results  Component Value Date   NA 142 06/06/2014   K 5.1 06/06/2014   CREATININE 1.16 06/06/2014    Assessment: Blood pressure control: controlled Progress toward BP goal:  at goal Comments: He is on amiodarone  daily, Lasix  daily, Losartan  daily, spironolactone 12.5mg  daily, terazosin  qHS.    Plan: Medications:  continue current medications Educational resources provided:   Self management tools provided:   Other plans: He was counseled to not take Lasix, spironolactone, and terazosin if he has N/V/D or decreased intake per mouth to prevent dizziness. He should be counseled at this next visit to also avoid Losartan if he is hypovolemic to prevent AKI.

## 2014-06-12 NOTE — Assessment & Plan Note (Addendum)
He has a dry cough present at night especially with use of CPAP. This is likely due to laryngeal spasm due to dry air. He has no postnasal drip or nasal congestion, no fever/chills, or physical exam findings worrisome for PNA. The cough improves with use of lozenges.  Pt advised to continue using lozenges.  No refill for tessalon perls at this time given possible SE with this medication in long term use. The pt agreed with this plan

## 2014-06-12 NOTE — Assessment & Plan Note (Signed)
His swelling has improved with leg elevation. He has not wore compression stocking as the swelling has gone down. Continues to use antibacterial cream to prevent infection.

## 2014-06-14 ENCOUNTER — Other Ambulatory Visit: Payer: Self-pay | Admitting: Cardiovascular Disease

## 2014-06-15 ENCOUNTER — Other Ambulatory Visit: Payer: Self-pay | Admitting: Internal Medicine

## 2014-06-15 NOTE — Progress Notes (Signed)
Case discussed with Dr. Kennerly soon after the resident saw the patient.  We reviewed the resident's history and exam and pertinent patient test results.  I agree with the assessment, diagnosis, and plan of care documented in the resident's note. 

## 2014-06-17 ENCOUNTER — Other Ambulatory Visit: Payer: Self-pay | Admitting: *Deleted

## 2014-06-17 ENCOUNTER — Other Ambulatory Visit: Payer: Self-pay | Admitting: Internal Medicine

## 2014-06-17 MED ORDER — PRAVASTATIN SODIUM 40 MG PO TABS: 40.0000 mg | ORAL_TABLET | Freq: Every day | ORAL | Status: AC

## 2014-06-18 ENCOUNTER — Encounter: Payer: Self-pay | Admitting: Cardiovascular Disease

## 2014-06-18 ENCOUNTER — Ambulatory Visit (HOSPITAL_COMMUNITY): Payer: Medicare Other | Attending: Cardiology | Admitting: Radiology

## 2014-06-18 ENCOUNTER — Other Ambulatory Visit: Payer: Medicare Other

## 2014-06-18 ENCOUNTER — Ambulatory Visit (INDEPENDENT_AMBULATORY_CARE_PROVIDER_SITE_OTHER): Payer: Medicare Other | Admitting: Cardiovascular Disease

## 2014-06-18 VITALS — BP 158/88 | HR 64 | Ht 67.0 in | Wt 207.4 lb

## 2014-06-18 DIAGNOSIS — I350 Nonrheumatic aortic (valve) stenosis: Secondary | ICD-10-CM

## 2014-06-18 DIAGNOSIS — E669 Obesity, unspecified: Secondary | ICD-10-CM | POA: Diagnosis not present

## 2014-06-18 DIAGNOSIS — I359 Nonrheumatic aortic valve disorder, unspecified: Secondary | ICD-10-CM | POA: Insufficient documentation

## 2014-06-18 DIAGNOSIS — E785 Hyperlipidemia, unspecified: Secondary | ICD-10-CM | POA: Insufficient documentation

## 2014-06-18 DIAGNOSIS — I10 Essential (primary) hypertension: Secondary | ICD-10-CM | POA: Insufficient documentation

## 2014-06-18 DIAGNOSIS — I2510 Atherosclerotic heart disease of native coronary artery without angina pectoris: Secondary | ICD-10-CM

## 2014-06-18 NOTE — Progress Notes (Signed)
Echocardiogram performed.  

## 2014-06-18 NOTE — Progress Notes (Signed)
HPI:  80 year old gentleman well-known to me presenting for followup evaluation. He's been followed for aortic stenosis and coronary artery disease. He's had coronary artery disease dating back to the early 1990s. The patient had a remote inferior wall MI. He's undergone remote angioplasty of the RCA. He presented with syncope in 2013 and was noted to have severe aortic stenosis as well as high-grade coronary obstruction in the proximal LAD. He was considered very high risk for cardiac surgery and he underwent PCI of the proximal LAD with a bare-metal stent. He then underwent balloon aortic valvuloplasty. He's had episodes of GI bleeding on dual antiplatelet therapy and has been maintained on aspirin alone. The patient has also had cardiac arrhythmia with atrial tachycardia and he is managed with amiodarone. About one year ago he had a nonhealing ulcer on the left great toe and he underwent angioplasty of the left anterior tibial artery by Dr. Edilia Bo.  I last saw him about 6 months ago and he was clinically stable. He has been able to maintain independence, but has become more physically limited. He's had recurrent mechanical falls. He ambulates only with a walker now. He no longer drives a car. The patient has a good support system between neighbors and family. His preacher who generally accompanies him to office visits is his medical power of attorney. He is not present today.   The patient has developed shortness of breath with low-level activity. This is slowly progressive. He's also had episodes of dizziness and recently was noted to have an episode of hypotension with blood pressure about 95/55. He denies chest pain, orthopnea, or PND. He has developed leg swelling and has become dependent on diuretic therapy. There was some concern about his cognitive function and he underwent formal neurologic testing. Per the patient's report, after he completed neurocognitive testing, the neurologist told him  that he had "the mind of a 80 year old."    Outpatient Encounter Prescriptions as of 06/18/2014  Medication Sig  . amiodarone (PACERONE) 200 MG tablet TAKE 1 TABLET BY MOUTH ONCE DAILY  . aspirin EC 81 MG tablet Take 81 mg by mouth daily.  . Calcium Carb-Cholecalciferol (CALCIUM 600+D) 600-800 MG-UNIT TABS Take 1 tablet by mouth daily.  . ferrous sulfate 325 (65 FE) MG tablet Take 325 mg by mouth daily with breakfast.  . furosemide (LASIX) 20 MG tablet Take 1 tablet (20 mg total) by mouth every morning.  Marland Kitchen levothyroxine (SYNTHROID, LEVOTHROID) 25 MCG tablet Take 25 mcg by mouth daily before breakfast.  . losartan (COZAAR) 50 MG tablet TAKE 1 TABLET (50 MG TOTAL) BY MOUTH DAILY.  . Multiple Vitamin (MULTIVITAMIN) tablet Take 1 tablet by mouth at bedtime.  Marland Kitchen oxybutynin (DITROPAN) 5 MG tablet TAKE 1 TABLET (5 MG TOTAL) BY MOUTH 2 (TWO) TIMES DAILY.  . pantoprazole (PROTONIX) 40 MG tablet Take 40 mg by mouth daily.  . pravastatin (PRAVACHOL) 40 MG tablet Take 1 tablet (40 mg total) by mouth daily.  Marland Kitchen spironolactone (ALDACTONE) 25 MG tablet Take 25 mg by mouth every other day. Patient taking one half tablet every other day.  . terazosin (HYTRIN) 1 MG capsule TAKE ONE CAPSULE BY MOUTH AT BEDTIME  . vitamin B-12 (CYANOCOBALAMIN) 1000 MCG tablet Take 1,000 mcg by mouth daily.  . [DISCONTINUED] amiodarone (PACERONE) 200 MG tablet Take 200 mg by mouth daily.    Allergies  Allergen Reactions  . Penicillins Swelling and Other (See Comments)    REACTION: "turns red"    Past Medical  History  Diagnosis Date  . Glaucoma     Right eye  . Diverticulosis     multiple hospital admissions for GI bleed, has an episode roughly every 6 months,, last  colonscopy in 2006 showed diverticulosis, was evaluated by Dr. Madilyn FiremanHayes in the hospital in 2011 for lower GI bleed andd it  was suggested that the patient was not actively bleeding at that time and given his comorbid conditions colonoscopy was deferred. He is also  chronically constipated  . Macular degeneration of right eye   . Allergic rhinitis   . CAD (coronary artery disease)     s/p PCI in 1991;  Cardiac catheterization 11/02/11: Ostial LAD 95% with aneurysmal dilatation after this, proximal circumflex 70% with a superior branch 30-40%, proximal RCA 75% and 90%, mid RCA 50% and 90% before a PDA, then occluded.;    high risk for CABG;PCI 11/14/11: Bare-metal stent to the proximal LAD    . OSA on CPAP   . Hypertension   . Hyperlipidemia   . Chronic systolic heart failure     Echocardiogram 11/02/11: Moderate LVH, EF 30-35%, multiple wall motion abnormalities, critical aortic stenosis, AVA 0.6, mean gradient 45, mild MR, severe LAE  . Supraventricular tachycardia     2 syndromes-nonsustained atrial tachycardia//adenosine responsive diuretic positive reentry probably AV node reentry  . Aortic stenosis     s/p AV balloon valvuloplasty by Dr. [name] 11/2011  . Ischemic cardiomyopathy   . H/O: GI bleed     recurrent  . Heart murmur   . CHF (congestive heart failure)   . Pneumonia 2012  . Hypothyroidism   . GERD (gastroesophageal reflux disease)   . Falls frequently     3 times in the past week/notes 09/11/2013  . Myocardial infarction 1991; 10/28/2011  . Exertional shortness of breath     "not much" (09/11/2013)  . Arthritis     "might have slight; have trouble w/my fingers" (09/11/2013)  . Atrial fibrillation   . Peripheral vascular disease     ROS: Positive for shortness of breath, gait instability, leg weakness, dizziness, leg swelling, and mechanical falls. Otherwise Negative except as per HPI  BP 158/88  Pulse 64  Ht 5\' 7"  (1.702 m)  Wt 207 lb 6.4 oz (94.076 kg)  BMI 32.48 kg/m2  SpO2 95%  PHYSICAL EXAM: Pt is alert and oriented, pleasant elderly male in NAD HEENT: normal Neck: JVP - normal, carotids delayed/diminished bilaterally Lungs: CTA bilaterally CV: Irregular with grade 3/6 harsh crescendo decrescendo murmur best heard at the  right upper sternal border Abd: soft, NT, Positive BS, no hepatomegaly Ext: 1+ edema right ankle, nondominant left Skin: warm/dry no rash  EKG:  Sinus rhythm 74 beats per minute with first degree AV block, fusion complexes, and PACs. LVH with QRS widening is present  Cardiac Gated CT 10/21/2012: AORTIC ROOT:  Aortic Valve Description: The aortic valve is tricuspid with  marked thickening and calcification of each of the cusps.  Aortic Valve Area: 0.85 cm-sq  Aortic Annulus (systolic measurents):  Long-axis: 29.9 mm  Short-axis: 25.0 mm  Cross-sectional area: 5.29 cm-sq  Circumference: 84.0 mm  Sinuses of Valsalva (diastolic measurements:  L-SOV - Width: 33.4 mm  Height: 18.9 mm  R-SOV - Width: 33.3 mm  Height: 21.4 mm  [place]-SOV - Width: 36.1 mm  Height: 25.7 mm  Coronary Artery [place]:  L Main Ostium - 15.4 mm above the annulus.  RCA Ostium - 15.8 mm above the annulus.  OTHER AORTA AND  PULMONARY MEASUREMENTS:  Ascending aorta: (< 40 mm): 36 mm  Descending aorta: (< 40 mm): 31 mm  Main pulmonary artery: (< 30 mm): 25 mm  OTHER FINDINGS:  There is a mosaic appearance of the lung parenchyma, suggesting air  trapping from small airway disease. The lateral segment of the  right middle lobe there is a 4 mm nodule (image 51 of series 4)  which is nonspecific. Architectural distortion in the left lower  lobe likely reflects chronic scarring. No acute consolidative  airspace disease. No pleural effusions. There are some patchy  peripheral areas of subpleural reticulation and ground-glass  attenuation with slight upper lung predominance, which is  nonspecific, but could be indicative of an interstitial lung  disease such as nonspecific interstitial pneumonia (NSIP). There  is also a background of mild centrilobular emphysema and mild  diffuse bronchial wall thickening. There are no aggressive  appearing lytic or blastic lesions noted in the visualized portions  of the skeleton.    IMPRESSION:  1. The severely sclerotic tricuspid aortic valve with estimated  valve area of 0.85 cm2 by planimetry, compatible with severe aortic  stenosis. Aortic root measurements pertinent to potential TAVR  procedure, as detailed above. There are no adverse aortic root  features to suggest potential contraindication to TAVR procedure.  2. Assessment of the coronary arteries was nondiagnostic on this  examination secondary to the small size of the vessels, high  calcification of the vessels, and motion related artifact.  3. The appearance of the lungs suggests some degree of air  trapping for small airway disease. The patient also has diffuse  bronchial wall thickening and mild centrilobular emphysema, imaging  findings compatible with underlying COPD.  4. There is a pattern of some subpleural reticulation with some  patchy peripheral ground-glass attenuation that has a slight upper  lung predominance. 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  5. 4 mm nodule in the lateral segment of the right middle lobe. If  the patient is at high risk for bronchogenic carcinoma, follow-up  chest CT at 1 year is recommended. If the patient is at low risk,  no follow-up is needed. This recommendation follows the consensus  statement: Guidelines for Management of Small Pulmonary Nodules  Detected on CT Scans: A Statement from the Fleischner Society as  published in Radiology 2005; 237:395-400.   CTA Abdomen and Pelvis 10/21/2012: VASCULAR MEASUREMENTS PERTINENT TO TAVR:  AORTA:  Minimal Aortic Diameter - 13 x 11 mm (this is immediately above  the inferior mesenteric artery origin where there is nearly  circumferential thick calcified plaque)  Severity of Aortic Calcification - severe  RIGHT PELVIS:  Right Common Iliac Artery -  Minimal Diameter - 12.8 x 7.0 mm  Tortuosity - moderate  Calcification - moderate  Right External Iliac Artery -   Minimal Diameter - 9.9 x 9.3 mm  Tortuosity - mild  Calcification - none  Right Common Femoral Artery -  Minimal Diameter - 9.0 x 10.1 mm  Tortuosity - minimal  Calcification - mild  LEFT PELVIS:  Left Common Iliac Artery -  Minimal Diameter - 13.0 x 12.4 mm  Tortuosity - mild  Calcification - mild  Left External Iliac Artery -  Minimal Diameter - 9.9 x 9.4 mm  Tortuosity - mild  Calcification - none  Left Common Femoral Artery -  Minimal Diameter - 10.6 x 10.2 mm  Tortuosity - minimal  Calcification - mild  2D Echo 06/18/2014: Study Conclusions  - Left ventricle: The cavity size was normal. Wall thickness was increased in a pattern of moderate LVH. Akinetic inferolateral wall, severely hypokinetic inferior wall and anterolateral wall. The estimated ejection fraction was 35%. Doppler parameters are consistent with abnormal left ventricular relaxation (grade 1 diastolic dysfunction). - Aortic valve: Trileaflet; severely calcified leaflets. There was severe stenosis. There was trivial regurgitation. Mean gradient (S): 60 mm Hg. Peak gradient (S): 76 mm Hg. Valve area (VTI): 0.67 cm^2. - Mitral valve: Moderately calcified annulus. Mildly calcified leaflets . There was mild to moderate regurgitation. - Left atrium: The atrium was moderately dilated. - Right ventricle: The cavity size was normal. Systolic function was normal. - Tricuspid valve: Peak RV-RA gradient 46 mmHg. - Systemic veins: IVC not visualized. - Pericardium, extracardiac: A trivial pericardial effusion was identified.  Impressions:  - Normal LV size with moderate LV hypertrophy. EF 35% with wall motion abnormalities noted above. Severe aortic stenosis. Mild to moderate MR. Normal RV size and systolic function. Moderate pulmonary hypertension.  ASSESSMENT AND PLAN: 1. Severe symptomatic aortic stenosis 2. Ischemic heart disease with moderate-severe LV dysfunction (old inferior MI) 3. Physical  frailty/gait instability 4. PAT maintained on chronic amiodarone 5. Chronic mixed systolic and diastolic heart failure, NYHA functional class III 6. Recurrent GI bleeding when exposed to DAPT  I have personally reviewed the patient's echo images performed earlier today. He has moderate segmental LV dysfunction and progressive severe aortic stenosis with peak and mean transaortic valve gradients of 76 and 60 mm mercury, respectively. There is no question that he has severe symptomatic aortic stenosis. We discussed the natural history of this disease with associated poor prognosis on medical therapy. In the past he and his family/friends have expressed some reluctance to move forward with further interventional procedures, but the patient is increasingly symptomatic and he is interested in considering TAVR. I reviewed the relative pros and cons of TAVR versus an approach of palliative medical therapy with the patient and his friend who was present today. They understand the potential risks of the procedure need to be weighed against the benefits of symptom improvement and potential mortality benefit. If we were to decide to move forward with TAVR, he would require repeat cardiac catheterization. He underwent cardiac and peripheral CTA studies approximately 18 months ago and the abdomen/pelvis CT would also need to be repeated. I think the best course of action is for the patient to return to the multidisciplinary valve clinic with his family and medical power of attorney for further discussion. I would like to see the patient in conjunction with Dr Cornelius Moras to get his input. We will schedule this at the next available time.  Tonny Bollman MD 06/18/2014 3:46 PM

## 2014-06-18 NOTE — Patient Instructions (Signed)
Your physician recommends that you continue on your current medications as directed. Please refer to the Current Medication list given to you today.  Dr Excell Seltzer will be in touch with you in regards to the Referral to a Valve Clinic.

## 2014-06-28 ENCOUNTER — Telehealth: Payer: Self-pay | Admitting: Cardiovascular Disease

## 2014-06-28 NOTE — Telephone Encounter (Signed)
Pt gave me permission over the telephone to speak with Mr Eugene [name].   Tawny Hopping, pt's caretaker, calling to follow up on referral to valve clinic. He states they have not heard about an appt yet.  He is aware I am forwarding to Dr Excell Seltzer for review.

## 2014-06-28 NOTE — Telephone Encounter (Signed)
Follow up:    Eugene [name] called to follow up on the  Referral to a Valve Clinic.   Please give him a call with any information.

## 2014-06-30 NOTE — Telephone Encounter (Signed)
In reviewing the pt's future appointments he is scheduled to see Dr Cornelius Moras 07/12/14.

## 2014-07-02 ENCOUNTER — Encounter: Payer: Medicare Other | Admitting: Thoracic Surgery (Cardiothoracic Vascular Surgery)

## 2014-07-02 ENCOUNTER — Ambulatory Visit: Payer: Medicare Other | Admitting: Internal Medicine

## 2014-07-12 ENCOUNTER — Institutional Professional Consult (permissible substitution) (INDEPENDENT_AMBULATORY_CARE_PROVIDER_SITE_OTHER): Payer: Medicare Other | Admitting: Thoracic Surgery (Cardiothoracic Vascular Surgery)

## 2014-07-12 ENCOUNTER — Encounter: Payer: Self-pay | Admitting: Thoracic Surgery (Cardiothoracic Vascular Surgery)

## 2014-07-12 VITALS — BP 136/86 | HR 63 | Ht 67.0 in | Wt 207.0 lb

## 2014-07-12 DIAGNOSIS — I5032 Chronic diastolic (congestive) heart failure: Secondary | ICD-10-CM

## 2014-07-12 DIAGNOSIS — I350 Nonrheumatic aortic (valve) stenosis: Secondary | ICD-10-CM

## 2014-07-12 DIAGNOSIS — I2510 Atherosclerotic heart disease of native coronary artery without angina pectoris: Secondary | ICD-10-CM

## 2014-07-12 NOTE — Patient Instructions (Signed)
Schedule cardiac cath through Dr Earmon Phoenixooper's office

## 2014-07-12 NOTE — Progress Notes (Signed)
[facility]  MULTIDISCIPLINARY HEART VALVE CLINIC    CARDIOTHORACIC SURGERY CONSULTATION REPORT  Referring Provider is Tonny Bollman, MD PCP is Earl Lagos, MD  Chief Complaint  Patient presents with  . Aortic Stenosis    HPI:  Patient returns for follow up of severe aortic stenosis.  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 The patient's cardiac history dates back to 25 when he suffered an acute myocardial infarction. He was treated with primary angioplasty.  The patient has had known history of aortic stenosis with previous echocardiogram from 2010 demonstrating mild aortic stenosis  (AVA estimated 1.64cm2, mean gradient 16 mmHg) and ejection fraction 50-55%. He also had episodes of supraventricular tachycardia which were treated medically.  On October 31, 2011 the patient suffered a sudden syncopal episode while he was carrying in groceries from the car to his home. He fell and hit his head and was unconscious for at least 20 minutes. A friend was present and summoned EMS. The patient was brought directly to the emergency room where he arrived in sinus rhythm.  He ruled out for acute myocardial infarction. Echocardiogram demonstrated severe aortic stenosis with moderate to severe left ventricular dysfunction. Left heart catheterization was performed demonstrating severe three-vessel coronary artery disease. I had the opportunity to evaluate the patient during that hospitalization in conjunction with Dr Excell Seltzer, and we discussed possible high-risk aortic valve replacement and coronary artery bypass grafting versus less invasive and palliative means of treating his aortic stenosis and coronary artery disease at that time.  The patient had a somewhat complicated hospital course for a variety of reasons, and mental status waxed and waned a  bit. A decision was made to treat him with primary balloon valvuloplasty and PCI with bare-metal stent to the high-grade proximal stenosis of the left anterior descending coronary artery.  The patient did remarkably well after these procedures, although he did suffer a lower GI bleed felt likely related to diverticular disease.  He ultimately recovered and has been followed ever since by Dr. Excell Seltzer.  Dual and type late at therapy for his stent in the left anterior descending coronary artery was discontinued because of GI bleeding. The patient is also developed atrial tachycardia for which she is treated with amiodarone. Last year he developed a nonhealing ulcer on the left great toe for which he underwent angioplasty of the left anterior tibial artery by Dr. Edilia Bo.  In July the patient was hospitalized briefly with acute exacerbation of chronic diastolic congestive heart failure, and in August he was seen briefly in the emergency department for orthostatic dizzy spells without syncope. He has had several mechanical falls, although these have primarily been related to poor balance without any history of syncope.  Last month the patient was seen in followup by Dr. Excell Seltzer. At that time the patient was noted to have progressive symptoms of exertional shortness of breath.  Followup transthoracic echocardiogram demonstrated further progression the severity of aortic stenosis with peak and mean transvalvular gradients across the aortic valve measured 76 and 60 mm mercury, respectively.  The patient has been referred back for repeat surgical consultation to discuss treatment options for the management of severe, symptomatic aortic stenosis.  The patient lives locally and [place] with a gentleman who stays with him nearly 24 hours a day and assists with the patient's physical needs.  The patient has an excellent support  system with a preacher who serves as the patient's medical power of attorney in several family  members who also check on him on a regular basis. He is accompanied to the office today by his preacher (medical POA), his roommate, his nephew, and his nephew's wife.  He lives a very sedentary lifestyle. He is able to walk but his gait is unsteady. He uses a cane or walker to ambulate short distances around the house, but for anything beyond that the patient is essentially down to a wheelchair. However, he is able to get up to the bathroom on his own with minimal assistance and overall the patient states that his quality of life is fairly good. He has developed progressive shortness of breath to the point where he now gets short of breath with minimal activity. The patient has not had resting shortness of breath since he was hospitalized in early July. He denies any history of chest pain or chest tightness either with activity or at rest. He has not had any recent dizzy spells, or syncope. He has chronic bilateral lower extremity edema which has been under better control over the past 2 months.   Past Medical History  Diagnosis Date  . Glaucoma     Right eye  . Diverticulosis     multiple hospital admissions for GI bleed, has an episode roughly every 6 months,, last  colonscopy in 2006 showed diverticulosis, was evaluated by Dr. Madilyn Fireman in the hospital in 2011 for lower GI bleed andd it  was suggested that the patient was not actively bleeding at that time and given his comorbid conditions colonoscopy was deferred. He is also chronically constipated  . Macular degeneration of right eye   . Allergic rhinitis   . CAD (coronary artery disease)     s/p PCI in 1991;  Cardiac catheterization 11/02/11: Ostial LAD 95% with aneurysmal dilatation after this, proximal circumflex 70% with a superior branch 30-40%, proximal RCA 75% and 90%, mid RCA 50% and 90% before a PDA, then occluded.;    high risk for CABG;PCI 11/14/11: Bare-metal stent to the proximal LAD    . OSA on CPAP   . Hypertension   . Hyperlipidemia     . Chronic systolic heart failure     Echocardiogram 11/02/11: Moderate LVH, EF 30-35%, multiple wall motion abnormalities, critical aortic stenosis, AVA 0.6, mean gradient 45, mild MR, severe LAE  . Supraventricular tachycardia     2 syndromes-nonsustained atrial tachycardia//adenosine responsive diuretic positive reentry probably AV node reentry  . Aortic stenosis     s/p AV balloon valvuloplasty by Dr. Excell Seltzer 11/2011  . Ischemic cardiomyopathy   . H/O: GI bleed     recurrent  . Heart murmur   . CHF (congestive heart failure)   . Pneumonia 2012  . Hypothyroidism   . GERD (gastroesophageal reflux disease)   . Falls frequently     3 times in the past week/notes 09/11/2013  . Myocardial infarction 1991; 10/28/2011  . Exertional shortness of breath     "not much" (09/11/2013)  . Arthritis     "might have slight; have trouble w/my fingers" (09/11/2013)  . Atrial fibrillation   . Peripheral vascular disease     Past Surgical History  Procedure Laterality Date  . Glaucoma surgery      Implantation of Baerveldt glaucoma device lant with scleral reinforcement using tutoplast tissue graft right eye. [Other]  . Coronary angioplasty with stent placement  Jan. 23, 2013  . Corneal transplant Right   .  Cardiac valve replacement  11/30/2011    aortic valve  . Angioplasty Left 03/30/2013    tibial  . Tonsillectomy  1946  . Cataract extraction w/ intraocular lens  implant, bilateral Bilateral     Family History  Problem Relation Age of Onset  . Cancer Mother     Ovarian Cancer  . Cancer Father     Lung  . Heart disease Sister     Heart disease before age 84    History   Social History  . Marital Status: Widowed    Spouse Name: N/A    Number of Children: N/A  . Years of Education: N/A   Occupational History  . Not on file.   Social History Main Topics  . Smoking status: Former Smoker -- 3.00 packs/day for 21 years    Types: Cigarettes    Quit date: 10/08/1958  . Smokeless  tobacco: Never Used  . Alcohol Use: No  . Drug Use: No  . Sexual Activity: No   Other Topics Concern  . Not on file   Social History Narrative   Lives at the [facility]. His pastor is his HCPOA  [facility]) [phone]). His daugther has been removed because she use $500k from his estate without his approval.  He also has a close friend who takes him for his doctor's appointments and is with him during the hospital admission. 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 Never smoked or drank alcohol in past 40 years. No illicit drug use. Has Medicare.    Current Outpatient Prescriptions  Medication Sig Dispense Refill  . amiodarone (PACERONE) 200 MG tablet TAKE 1 TABLET BY MOUTH ONCE DAILY  30 tablet  0  . aspirin EC 81 MG tablet Take 81 mg by mouth daily.      . Calcium Carb-Cholecalciferol (CALCIUM 600+D) 600-800 MG-UNIT TABS Take 1 tablet by mouth daily.      . ferrous sulfate 325 (65 FE) MG tablet Take 325 mg by mouth daily with breakfast.      . furosemide (LASIX) 20 MG tablet Take 1 tablet (20 mg total) by mouth every morning.  30 tablet  6  . levothyroxine (SYNTHROID, LEVOTHROID) 25 MCG tablet Take 25 mcg by mouth daily before breakfast.      . losartan (COZAAR) 50 MG tablet TAKE 1 TABLET (50 MG TOTAL) BY MOUTH DAILY.  30 tablet  1  . Multiple Vitamin (MULTIVITAMIN) tablet Take 1 tablet by mouth at bedtime.      Marland Kitchen oxybutynin (DITROPAN) 5 MG tablet TAKE 1 TABLET (5 MG TOTAL) BY MOUTH 2 (TWO) TIMES DAILY.  180 tablet  1  . pantoprazole (PROTONIX) 40 MG tablet Take 40 mg by mouth daily.      . pravastatin (PRAVACHOL) 40 MG tablet Take 1 tablet (40 mg total) by mouth daily.  90 tablet  4  . spironolactone (ALDACTONE) 25 MG tablet Take 25 mg by mouth every other day. Patient taking one half tablet every other day.      . terazosin (HYTRIN) 1 MG capsule TAKE ONE CAPSULE BY MOUTH AT BEDTIME  30 capsule  2  . vitamin B-12  (CYANOCOBALAMIN) 1000 MCG tablet Take 1,000 mcg by mouth daily.       No current facility-administered medications for this visit.    Allergies  Allergen Reactions  . Penicillins Swelling and Other (See Comments)    REACTION: "turns red"  Review of Systems:   General:  good appetite, decreased energy, no weight gain, no weight loss, no fever  Cardiac:  no chest pain with exertion, no chest pain at rest, + SOB with minimal exertion, no resting SOB, no PND, no orthopnea, no palpitations, + arrhythmia, + atrial fibrillation, + LE edema, + dizzy spells, no recent syncope  Respiratory:  + shortness of breath, no home oxygen, no productive cough, + intermittent dry cough, no bronchitis, no wheezing, no hemoptysis, no asthma, no pain with inspiration or cough, + sleep apnea, + CPAP at night  GI:   no difficulty swallowing, no reflux, no frequent heartburn, no hiatal hernia, no abdominal pain, no constipation, no diarrhea, no hematochezia, no hematemesis, no melena  GU:   no dysuria,  + frequency, no urinary tract infection, no hematuria, no enlarged prostate, no kidney stones, + kidney disease  Vascular:  no pain suggestive of claudication, no pain in feet, + occasional leg cramps, no varicose veins, no DVT, + non-healing foot ulcer  Neuro:   no stroke, no TIA's, no seizures, no headaches, no temporary blindness one eye,  no slurred speech, + peripheral neuropathy, no chronic pain, + instability of gait, no memory/cognitive dysfunction  Musculoskeletal: mild arthritis, no joint swelling, no myalgias, + difficulty walking, very limited mobility   Skin:   no rash, no itching, no skin infections, no pressure sores or ulcerations  Psych:   no anxiety, no depression, no nervousness, no unusual recent stress  Eyes:   no blurry vision, no floaters, no recent vision changes, + wears glasses or contacts  ENT:   + hearing loss, no loose or painful teeth, + dentures  Hematologic:  + easy bruising, no  abnormal bleeding, no clotting disorder, no frequent epistaxis  Endocrine:  no diabetes, does not check CBG's at home           Physical Exam:   BP 136/86  Pulse 63  Ht 5\' 7"  (1.702 m)  Wt 207 lb (93.895 kg)  BMI 32.41 kg/m2  SpO2 94%  General:  Obese, elderly and frail-appearing  HEENT:  Unremarkable   Neck:   no JVD, no bruits, no adenopathy   Chest:   Fairly clear to auscultation with exception of crackles R lung base, symmetrical breath sounds, no wheezes, no rhonchi   CV:   RRR, grade IV/VI crescendo/decrescendo murmur heard best at RSB,  no diastolic murmur  Abdomen:  soft, non-tender, no masses   Extremities:  warm, well-perfused, pulses not palpable, mild bilateral LE edema  Rectal/GU  Deferred  Neuro:   Grossly non-focal and symmetrical throughout  Skin:   Clean and dry, no rashes, no breakdown   Diagnostic Tests:  Transthoracic Echocardiography  Patient: Eugene [name], Georgi N MR #: [id number] Study Date: 06/18/2014 Gender: M Age: [age] Height: 170.2 cm Weight: 97.5 kg BSA: 2.18 m^2 Pt. Status: Room:  ORDERING Tonny BollmanMichael Cooper REFERRING Tonny BollmanMichael Cooper ATTENDING Marca Anconaalton McLean, M.D. SONOGRAPHER Aida RaiderNaTashia Rodgers, RDCS PERFORMING [facility], Outpatient  cc:  ------------------------------------------------------------------- LV EF: 35%  ------------------------------------------------------------------- Indications: 424.1 Aortic valve disorders.  ------------------------------------------------------------------- History: PMH: Acquired from the patient and from the patient&'s chart. PMH: Peripheral Vascular Disease. Paroxysmal Atrial Fibrillation. Aortic Stenosis. CAD. Chronic Venous Insufficiency. Obstructive Sleep Apnea-CPAP. Bradycardia. Supraventricular Tachycardia. Murmur. Ischemic Cardiomyopathy. MI. Risk factors: Hypertension. Obese. Dyslipidemia.  ------------------------------------------------------------------- Study Conclusions  - Left ventricle: The  cavity size was normal. Wall thickness was increased in a pattern of moderate LVH. Akinetic inferolateral wall, severely hypokinetic inferior wall and anterolateral wall.  The estimated ejection fraction was 35%. Doppler parameters are consistent with abnormal left ventricular relaxation (grade 1 diastolic dysfunction). - Aortic valve: Trileaflet; severely calcified leaflets. There was severe stenosis. There was trivial regurgitation. Mean gradient (S): 60 mm Hg. Peak gradient (S): 76 mm Hg. Valve area (VTI): 0.67 cm^2. - Mitral valve: Moderately calcified annulus. Mildly calcified leaflets . There was mild to moderate regurgitation. - Left atrium: The atrium was moderately dilated. - Right ventricle: The cavity size was normal. Systolic function was normal. - Tricuspid valve: Peak RV-RA gradient 46 mmHg. - Systemic veins: IVC not visualized. - Pericardium, extracardiac: A trivial pericardial effusion was identified.  Impressions:  - Normal LV size with moderate LV hypertrophy. EF 35% with wall motion abnormalities noted above. Severe aortic stenosis. Mild to moderate MR. Normal RV size and systolic function. Moderate pulmonary hypertension.  ------------------------------------------------------------------- Labs, prior tests, procedures, and surgery: Status post Aortic Valve Balloon Valvuloplasty. Status post PCI-Bare Metal Stent to Proximal LAD. Transthoracic echocardiography. M-mode, complete 2D, spectral Doppler, and color Doppler. Birthdate: Patient birthdate: 04-24-1924. Age: Patient is 81 yr old. Sex: Gender: male. BMI: 33.7 kg/m^2. Blood pressure: 116/78 Patient status: Outpatient. Study date: Study date: 06/18/2014. Study time: 02:16 PM. Location: [place] [facility]  -------------------------------------------------------------------  ------------------------------------------------------------------- Left ventricle: The cavity size was normal. Wall thickness  was increased in a pattern of moderate LVH. Akinetic inferolateral wall, severely hypokinetic inferior wall and anterolateral wall. The estimated ejection fraction was 35%. Doppler parameters are consistent with abnormal left ventricular relaxation (grade 1 diastolic dysfunction).  ------------------------------------------------------------------- Aortic valve: Trileaflet; severely calcified leaflets. Doppler: There was severe stenosis. There was trivial regurgitation. VTI ratio of LVOT to aortic valve: 0.2. Valve area (VTI): 0.67 cm^2. Indexed valve area (VTI): 0.31 cm^2/m^2. Peak velocity ratio of LVOT to aortic valve: 0.16. Valve area (Vmax): 0.6 cm^2. Indexed valve area (Vmax): 0.27 cm^2/m^2. Mean velocity ratio of LVOT to aortic valve: 0.13. Valve area (Vmean): 0.51 cm^2. Indexed valve area (Vmean): 0.23 cm^2/m^2. Mean gradient (S): 60 mm Hg. Peak gradient (S): 76 mm Hg.  ------------------------------------------------------------------- Aorta: Aortic root: The aortic root was normal in size. Ascending aorta: The ascending aorta was normal in size.  ------------------------------------------------------------------- Mitral valve: Moderately calcified annulus. Mildly calcified leaflets . Doppler: There was no evidence for stenosis. There was mild to moderate regurgitation. Peak gradient (D): 3 mm Hg.  ------------------------------------------------------------------- Left atrium: The atrium was moderately dilated.  ------------------------------------------------------------------- Right ventricle: The cavity size was normal. Systolic function was normal.  ------------------------------------------------------------------- Pulmonic valve: Structurally normal valve. Cusp separation was normal. Doppler: Transvalvular velocity was within the normal range. There was trivial regurgitation.  ------------------------------------------------------------------- Tricuspid valve:  Doppler: There was trivial regurgitation. Peak RV-RA gradient 46 mmHg.  ------------------------------------------------------------------- Right atrium: The atrium was normal in size.  ------------------------------------------------------------------- Pericardium: A trivial pericardial effusion was identified.  ------------------------------------------------------------------- Systemic veins: IVC not visualized.  ------------------------------------------------------------------- Measurements  Left ventricle Value Reference LV ID, ED, PLAX chordal (H) 56 mm 43 - 52 LV ID, ES, PLAX chordal (H) 47 mm 23 - 38 LV fx shortening, PLAX chordal (L) 16 % >=29 LV PW thickness, ED 14 mm --------- IVS/LV PW ratio, ED 1.21 <=1.3 Stroke volume, 2D 71 ml --------- Stroke volume/bsa, 2D 33 ml/m^2 --------- LV e&', lateral 5.87 cm/s --------- LV E/e&', lateral 13.51 --------- LV e&', medial 4.35 cm/s --------- LV E/e&', medial 18.23 --------- LV e&', average 5.11 cm/s --------- LV E/e&', average 15.52 ---------  Ventricular septum Value Reference IVS thickness, ED 17 mm ---------  LVOT Value  Reference LVOT ID, S 22 mm --------- LVOT area 3.8 cm^2 --------- LVOT peak velocity, S 70.5 cm/s --------- LVOT mean velocity, S 50.2 cm/s --------- LVOT VTI, S 21 cm ---------  Aortic valve Value Reference Aortic valve peak velocity, S 437 cm/s --------- Aortic valve mean velocity, S 374 cm/s --------- Aortic valve VTI, S 103 cm --------- Aortic mean gradient, S 60 mm Hg --------- Aortic peak gradient, S 76 mm Hg --------- VTI ratio, LVOT/AV 0.2 --------- Aortic valve area, VTI 0.67 cm^2 --------- Aortic valve area/bsa, VTI 0.31 cm^2/m^2 --------- Velocity ratio, peak, LVOT/AV 0.16 --------- Aortic valve area, peak velocity 0.6 cm^2 --------- Aortic valve area/bsa, peak 0.27 cm^2/m^2 --------- velocity Velocity ratio, mean, LVOT/AV 0.13 --------- Aortic valve area, mean velocity 0.51  cm^2 --------- Aortic valve area/bsa, mean 0.23 cm^2/m^2 --------- velocity Aortic regurg pressure half-time 1084 ms ---------  Aorta Value Reference Aortic root ID, ED 33 mm ---------  Left atrium Value Reference LA ID, A-P, ES 54 mm --------- LA ID/bsa, A-P (H) 2.47 cm/m^2 <=2.2  Mitral valve Value Reference Mitral E-wave peak velocity 79.3 cm/s --------- Mitral A-wave peak velocity 111 cm/s --------- Mitral deceleration time 183 ms 150 - 230 Mitral peak gradient, D 3 mm Hg --------- Mitral E/A ratio, peak 0.7 --------- Mitral maximal regurg velocity, 706 cm/s --------- PISA  Right ventricle Value Reference RV s&', lateral, S 13.6 cm/s ---------  Legend: (L) and (H) mark values outside specified reference range.  ------------------------------------------------------------------- Prepared and Electronically Authenticated by  Marca Ancona, M.D. 2015-09-11T18:25:14    STS Risk Calculator  Procedure    AVR + CABG  Risk of Mortality   13.1% Morbidity or Mortality  51.9% Prolonged LOS   34.2% Short LOS    4.9% Permanent Stroke   5.2% Prolonged Vent Support  37.1% DSW Infection    0.9% Renal Failure    17.5% Reoperation    19.0%     Impression:  The patient has stage D severe symptomatic aortic stenosis with moderate left ventricular systolic function and history of multivessel coronary artery disease that is incompletely revascularized.  He has numerous comorbid medical problems and very limited physical mobility which would make conventional surgical aortic valve replacement with or without coronary artery bypass surgery extremely high risk.  I do not feel the patient should be considered a candidate for conventional surgery. However, in the past he demonstrated significant clinical improvement following balloon aortic valvuloplasty, and given his recent progression of symptoms and findings on transthoracic echocardiogram, it might be reasonable to consider  transcatheter aortic valve replacement for definitive management of his aortic stenosis.   Plan:  The patient and his accompanying friends and family were counseled at length regarding treatment alternatives for management of severe symptomatic aortic stenosis. Alternative approaches such as conventional aortic valve replacement, transcatheter aortic valve replacement, and palliative medical therapy were compared and contrasted at length.  The risks associated with conventional surgical aortic valve replacement were been discussed in detail, as were reasons why I don't feel that conventional surgery should be considered an option. Long-term prognosis with medical therapy was discussed. Expectations for his convalescence following transcatheter aortic valve replacement with or without complications were discussed.  This discussion was placed in the context of the patient's own specific clinical presentation and past medical history.  All of their questions been addressed.  The patient is interested in proceeding with transcatheter aortic valve replacement if subsequent diagnostic testing suggests that he will benefit and risks are acceptable. He will need repeat  left and right heart catheterization followed by CT angiography. This will be scheduled as soon as practical and the patient will return for followup once all diagnostic testing has been completed.   I spent in excess of 60 minutes during the conduct of this office consultation and >50% of this time involved direct face-to-face encounter with the patient for counseling and/or coordination of their care.   Salvatore Decent. Cornelius Moras, MD 07/12/2014 8:01 PM

## 2014-07-14 ENCOUNTER — Encounter (HOSPITAL_COMMUNITY): Payer: Self-pay | Admitting: Pharmacy Technician

## 2014-07-16 ENCOUNTER — Ambulatory Visit (HOSPITAL_COMMUNITY)
Admission: RE | Admit: 2014-07-16 | Discharge: 2014-07-16 | Disposition: A | Discharge status: Home / Self Care | Payer: Medicare Other | Source: Ambulatory Visit | Attending: Cardiovascular Disease | Admitting: Cardiovascular Disease

## 2014-07-16 ENCOUNTER — Encounter (HOSPITAL_COMMUNITY)
Admission: RE | Disposition: A | Discharge status: Home / Self Care | Payer: Self-pay | Source: Ambulatory Visit | Attending: Cardiovascular Disease

## 2014-07-16 DIAGNOSIS — I471 Supraventricular tachycardia: Secondary | ICD-10-CM | POA: Insufficient documentation

## 2014-07-16 DIAGNOSIS — I358 Other nonrheumatic aortic valve disorders: Secondary | ICD-10-CM | POA: Diagnosis not present

## 2014-07-16 DIAGNOSIS — I255 Ischemic cardiomyopathy: Secondary | ICD-10-CM | POA: Insufficient documentation

## 2014-07-16 DIAGNOSIS — I10 Essential (primary) hypertension: Secondary | ICD-10-CM | POA: Insufficient documentation

## 2014-07-16 DIAGNOSIS — I5042 Chronic combined systolic (congestive) and diastolic (congestive) heart failure: Secondary | ICD-10-CM | POA: Insufficient documentation

## 2014-07-16 DIAGNOSIS — I700 Atherosclerosis of aorta: Secondary | ICD-10-CM | POA: Diagnosis not present

## 2014-07-16 DIAGNOSIS — Z955 Presence of coronary angioplasty implant and graft: Secondary | ICD-10-CM | POA: Diagnosis not present

## 2014-07-16 DIAGNOSIS — I350 Nonrheumatic aortic (valve) stenosis: Secondary | ICD-10-CM | POA: Insufficient documentation

## 2014-07-16 DIAGNOSIS — E785 Hyperlipidemia, unspecified: Secondary | ICD-10-CM | POA: Diagnosis not present

## 2014-07-16 DIAGNOSIS — I4891 Unspecified atrial fibrillation: Secondary | ICD-10-CM | POA: Diagnosis not present

## 2014-07-16 DIAGNOSIS — I2510 Atherosclerotic heart disease of native coronary artery without angina pectoris: Secondary | ICD-10-CM | POA: Insufficient documentation

## 2014-07-16 DIAGNOSIS — G4733 Obstructive sleep apnea (adult) (pediatric): Secondary | ICD-10-CM | POA: Diagnosis not present

## 2014-07-16 DIAGNOSIS — Z9181 History of falling: Secondary | ICD-10-CM | POA: Insufficient documentation

## 2014-07-16 DIAGNOSIS — I252 Old myocardial infarction: Secondary | ICD-10-CM | POA: Insufficient documentation

## 2014-07-16 DIAGNOSIS — Z7982 Long term (current) use of aspirin: Secondary | ICD-10-CM | POA: Diagnosis not present

## 2014-07-16 DIAGNOSIS — K219 Gastro-esophageal reflux disease without esophagitis: Secondary | ICD-10-CM | POA: Insufficient documentation

## 2014-07-16 DIAGNOSIS — I272 Other secondary pulmonary hypertension: Secondary | ICD-10-CM | POA: Insufficient documentation

## 2014-07-16 DIAGNOSIS — J432 Centrilobular emphysema: Secondary | ICD-10-CM | POA: Diagnosis not present

## 2014-07-16 HISTORY — PX: LEFT AND RIGHT HEART CATHETERIZATION WITH CORONARY ANGIOGRAM: SHX5449

## 2014-07-16 LAB — BASIC METABOLIC PANEL
Anion gap: 12 (ref 5–15)
BUN: 21 mg/dL (ref 6–23)
CALCIUM: 9.3 mg/dL (ref 8.4–10.5)
CO2: 27 mEq/L (ref 19–32)
Chloride: 101 mEq/L (ref 96–112)
Creatinine, Ser: 1.08 mg/dL (ref 0.50–1.35)
GFR calc Af Amer: 69 mL/min — ABNORMAL LOW (ref >90)
GFR calc non Af Amer: 59 mL/min — ABNORMAL LOW (ref >90)
GLUCOSE: 91 mg/dL (ref 70–99)
Potassium: 4.2 mEq/L (ref 3.7–5.3)
Sodium: 140 mEq/L (ref 137–147)

## 2014-07-16 LAB — CBC
HEMATOCRIT: 47.2 % (ref 39.0–52.0)
HEMOGLOBIN: 16.1 g/dL (ref 13.0–17.0)
MCH: 31.8 pg (ref 26.0–34.0)
MCHC: 34.1 g/dL (ref 30.0–36.0)
MCV: 93.1 fL (ref 78.0–100.0)
Platelets: 148 10*3/uL — ABNORMAL LOW (ref 150–400)
RBC: 5.07 MIL/uL (ref 4.22–5.81)
RDW: 14.3 % (ref 11.5–15.5)
WBC: 6.9 10*3/uL (ref 4.0–10.5)

## 2014-07-16 LAB — POCT I-STAT 3, VENOUS BLOOD GAS (G3P V)
Acid-Base Excess: 1 mmol/L (ref 0.0–2.0)
BICARBONATE: 26.6 meq/L — AB (ref 20.0–24.0)
O2 Saturation: 58 %
PCO2 VEN: 43.4 mmHg — AB (ref 45.0–50.0)
TCO2: 28 mmol/L (ref 0–100)
pH, Ven: 7.395 — ABNORMAL HIGH (ref 7.250–7.300)
pO2, Ven: 31 mmHg (ref 30.0–45.0)

## 2014-07-16 LAB — POCT I-STAT 3, ART BLOOD GAS (G3+)
Acid-Base Excess: 1 mmol/L (ref 0.0–2.0)
BICARBONATE: 25.7 meq/L — AB (ref 20.0–24.0)
O2 SAT: 92 %
PCO2 ART: 38.3 mmHg (ref 35.0–45.0)
PH ART: 7.435 (ref 7.350–7.450)
TCO2: 27 mmol/L (ref 0–100)
pO2, Arterial: 62 mmHg — ABNORMAL LOW (ref 80.0–100.0)

## 2014-07-16 LAB — PROTIME-INR
INR: 1 (ref 0.00–1.49)
Prothrombin Time: 13.2 seconds (ref 11.6–15.2)

## 2014-07-16 SURGERY — LEFT AND RIGHT HEART CATHETERIZATION WITH CORONARY ANGIOGRAM
Anesthesia: LOCAL

## 2014-07-16 MED ORDER — SODIUM CHLORIDE 0.9 % IV SOLN: 250.0000 mL | INTRAVENOUS | Status: DC | PRN

## 2014-07-16 MED ORDER — ASPIRIN 81 MG PO CHEW: 81.0000 mg | CHEWABLE_TABLET | ORAL | Status: DC

## 2014-07-16 MED ORDER — SODIUM CHLORIDE 0.9 % IJ SOLN: 3.0000 mL | Freq: Two times a day (BID) | INTRAMUSCULAR | Status: DC

## 2014-07-16 MED ORDER — ONDANSETRON HCL 4 MG/2ML IJ SOLN: 4.0000 mg | Freq: Four times a day (QID) | INTRAMUSCULAR | Status: DC | PRN

## 2014-07-16 MED ORDER — LIDOCAINE HCL (PF) 1 % IJ SOLN
INTRAMUSCULAR | Status: AC
  Filled 2014-07-16: qty 30

## 2014-07-16 MED ORDER — HYDRALAZINE HCL 20 MG/ML IJ SOLN: 10.0000 mg | INTRAMUSCULAR | Status: DC | PRN

## 2014-07-16 MED ORDER — SODIUM CHLORIDE 0.9 % IJ SOLN: 3.0000 mL | INTRAMUSCULAR | Status: DC | PRN

## 2014-07-16 MED ORDER — SODIUM CHLORIDE 0.9 % IV SOLN
INTRAVENOUS | Status: DC
  Administered 2014-07-16: 10:00:00 via INTRAVENOUS

## 2014-07-16 MED ORDER — ACETAMINOPHEN 325 MG PO TABS: 650.0000 mg | ORAL_TABLET | ORAL | Status: DC | PRN

## 2014-07-16 MED ORDER — LABETALOL HCL 5 MG/ML IV SOLN
INTRAVENOUS | Status: AC
  Filled 2014-07-16: qty 4

## 2014-07-16 MED ORDER — NITROGLYCERIN 1 MG/10 ML FOR IR/CATH LAB
INTRA_ARTERIAL | Status: AC
  Filled 2014-07-16: qty 10

## 2014-07-16 MED ORDER — LABETALOL HCL 5 MG/ML IV SOLN: 20.0000 mg | INTRAVENOUS | Status: DC | PRN

## 2014-07-16 MED ORDER — FENTANYL CITRATE 0.05 MG/ML IJ SOLN
INTRAMUSCULAR | Status: AC
  Filled 2014-07-16: qty 2

## 2014-07-16 MED ORDER — MIDAZOLAM HCL 2 MG/2ML IJ SOLN
INTRAMUSCULAR | Status: AC
  Filled 2014-07-16: qty 2

## 2014-07-16 MED ORDER — HEPARIN (PORCINE) IN NACL 2-0.9 UNIT/ML-% IJ SOLN
INTRAMUSCULAR | Status: AC
  Filled 2014-07-16: qty 1000

## 2014-07-16 NOTE — CV Procedure (Signed)
    Cardiac Catheterization Procedure Note  Name: Barrie Dunkerfird N Justice MRN: [id number] DOB: 09/11/1924  Procedure: Right Heart Cath, Selective Coronary Angiography, aortic root angiography  Indication: Severe aortic stenosis   Procedural Details: The right groin was prepped, draped, and anesthetized with 1% lidocaine. A 5 French sheath was inserted into the right femoral artery via a from wall puncture and a 7 French sheath was inserted into the right femoral vein via a from wall puncture. A Swan-Ganz catheter was used for the right heart catheterization. Standard protocol was followed for recording of right heart pressures and sampling of oxygen saturations. Fick cardiac output was calculated. Standard Judkins catheters were used for selective coronary angiography. A JL 5 catheter had to be used for the left coronary artery. Aortic root angiography was performed using a pigtail catheter. There were no immediate procedural complications. The patient was transferred to the post catheterization recovery area for further monitoring.  Procedural Findings: Hemodynamics RA 5 RV 36/7 PA 42/20 with a mean of 29 PCWP 17 LV not recorded AO 160/77 with a mean of 109  Oxygen saturations: PA 58 AO 92  Cardiac Output (Fick) 3.6  Cardiac Index (Fick) 1.8   Coronary angiography: Coronary dominance: right  Left mainstem: The left mainstem arises from the left coronary cusp. The vessel is calcified but widely patent.  Left anterior descending (LAD): The LAD is patent. The stented segment in the proximal LAD is widely patent without stenosis. The mid and distal LAD have mild diffuse irregularity without high-grade stenosis. The apical LAD supplies a well formed collateral to the right posterolateral branch.  Left circumflex (LCx): The left circumflex is medium in caliber. The mid vessel has 60% stenosis before the first obtuse marginal. There is no critical stenosis in the left circumflex  distribution.  Right coronary artery (RCA): The RCA is severely diseased. The vessel is severely calcified. There are 90% stenoses in the proximal, mid, and distal vessel. The PDA branch is patent. The PLA is occluded and fills from left to right collaterals.  Left ventriculography: Not performed  Aortic root angiography: The aortic valve is severely calcified. There is mild aortic insufficiency.  Estimated Blood Loss: Minimal  Final Conclusions:   1. Diffuse coronary artery disease with continued patency of the stented segment in the proximal LAD, moderate stenosis of the left circumflex, and severe diffuse stenosis of the right coronary artery with left-to-right collaterals identified 2. Essentially normal right heart intracardiac filling pressures 3. Severely calcified aortic valve with no known severe aortic stenosis and angiographic evidence of 1+ aortic insufficiency  Recommendations: Medical therapy for CAD. 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 Continue evaluation for TAVR.  Tonny BollmanMichael Rihanna Marseille MD, [facility] 07/16/2014, 12:57 PM

## 2014-07-16 NOTE — Discharge Instructions (Signed)

## 2014-07-16 NOTE — H&P (View-Only) (Signed)
  HPI:  80-year-old gentleman well-known to me presenting for followup evaluation. He's been followed for aortic stenosis and coronary artery disease. He's had coronary artery disease dating back to the early 1990s. The patient had a remote inferior wall MI. He's undergone remote angioplasty of the RCA. He presented with syncope in 2013 and was noted to have severe aortic stenosis as well as high-grade coronary obstruction in the proximal LAD. He was considered very high risk for cardiac surgery and he underwent PCI of the proximal LAD with a bare-metal stent. He then underwent balloon aortic valvuloplasty. He's had episodes of GI bleeding on dual antiplatelet therapy and has been maintained on aspirin alone. The patient has also had cardiac arrhythmia with atrial tachycardia and he is managed with amiodarone. About one year ago he had a nonhealing ulcer on the left great toe and he underwent angioplasty of the left anterior tibial artery by Dr. Dickson.  I last saw him about 6 months ago and he was clinically stable. He has been able to maintain independence, but has become more physically limited. He's had recurrent mechanical falls. He ambulates only with a walker now. He no longer drives a car. The patient has a good support system between neighbors and family. His preacher who generally accompanies him to office visits is his medical power of attorney. He is not present today.   The patient has developed shortness of breath with low-level activity. This is slowly progressive. He's also had episodes of dizziness and recently was noted to have an episode of hypotension with blood pressure about 95/55. He denies chest pain, orthopnea, or PND. He has developed leg swelling and has become dependent on diuretic therapy. There was some concern about his cognitive function and he underwent formal neurologic testing. Per the patient's report, after he completed neurocognitive testing, the neurologist told him  that he had "the mind of a 27-year-old."    Outpatient Encounter Prescriptions as of 06/18/2014  Medication Sig  . amiodarone (PACERONE) 200 MG tablet TAKE 1 TABLET BY MOUTH ONCE DAILY  . aspirin EC 81 MG tablet Take 81 mg by mouth daily.  . Calcium Carb-Cholecalciferol (CALCIUM 600+D) 600-800 MG-UNIT TABS Take 1 tablet by mouth daily.  . ferrous sulfate 325 (65 FE) MG tablet Take 325 mg by mouth daily with breakfast.  . furosemide (LASIX) 20 MG tablet Take 1 tablet (20 mg total) by mouth every morning.  . levothyroxine (SYNTHROID, LEVOTHROID) 25 MCG tablet Take 25 mcg by mouth daily before breakfast.  . losartan (COZAAR) 50 MG tablet TAKE 1 TABLET (50 MG TOTAL) BY MOUTH DAILY.  . Multiple Vitamin (MULTIVITAMIN) tablet Take 1 tablet by mouth at bedtime.  . oxybutynin (DITROPAN) 5 MG tablet TAKE 1 TABLET (5 MG TOTAL) BY MOUTH 2 (TWO) TIMES DAILY.  . pantoprazole (PROTONIX) 40 MG tablet Take 40 mg by mouth daily.  . pravastatin (PRAVACHOL) 40 MG tablet Take 1 tablet (40 mg total) by mouth daily.  . spironolactone (ALDACTONE) 25 MG tablet Take 25 mg by mouth every other day. Patient taking one half tablet every other day.  . terazosin (HYTRIN) 1 MG capsule TAKE ONE CAPSULE BY MOUTH AT BEDTIME  . vitamin B-12 (CYANOCOBALAMIN) 1000 MCG tablet Take 1,000 mcg by mouth daily.  . [DISCONTINUED] amiodarone (PACERONE) 200 MG tablet Take 200 mg by mouth daily.    Allergies  Allergen Reactions  . Penicillins Swelling and Other (See Comments)    REACTION: "turns red"    Past Medical   History  Diagnosis Date  . Glaucoma     Right eye  . Diverticulosis     multiple hospital admissions for GI bleed, has an episode roughly every 6 months,, last  colonscopy in 2006 showed diverticulosis, was evaluated by Dr. Hayes in the hospital in 2011 for lower GI bleed andd it  was suggested that the patient was not actively bleeding at that time and given his comorbid conditions colonoscopy was deferred. He is also  chronically constipated  . Macular degeneration of right eye   . Allergic rhinitis   . CAD (coronary artery disease)     s/p PCI in 1991;  Cardiac catheterization 11/02/11: Ostial LAD 95% with aneurysmal dilatation after this, proximal circumflex 70% with a superior branch 30-40%, proximal RCA 75% and 90%, mid RCA 50% and 90% before a PDA, then occluded.;    high risk for CABG;PCI 11/14/11: Bare-metal stent to the proximal LAD    . OSA on CPAP   . Hypertension   . Hyperlipidemia   . Chronic systolic heart failure     Echocardiogram 11/02/11: Moderate LVH, EF 30-35%, multiple wall motion abnormalities, critical aortic stenosis, AVA 0.6, mean gradient 45, mild MR, severe LAE  . Supraventricular tachycardia     2 syndromes-nonsustained atrial tachycardia//adenosine responsive diuretic positive reentry probably AV node reentry  . Aortic stenosis     s/p AV balloon valvuloplasty by Dr. Damara Klunder 11/2011  . Ischemic cardiomyopathy   . H/O: GI bleed     recurrent  . Heart murmur   . CHF (congestive heart failure)   . Pneumonia 2012  . Hypothyroidism   . GERD (gastroesophageal reflux disease)   . Falls frequently     3 times in the past week/notes 09/11/2013  . Myocardial infarction 1991; 10/28/2011  . Exertional shortness of breath     "not much" (09/11/2013)  . Arthritis     "might have slight; have trouble w/my fingers" (09/11/2013)  . Atrial fibrillation   . Peripheral vascular disease     ROS: Positive for shortness of breath, gait instability, leg weakness, dizziness, leg swelling, and mechanical falls. Otherwise Negative except as per HPI  BP 158/88  Pulse 64  Ht 5' 7" (1.702 m)  Wt 207 lb 6.4 oz (94.076 kg)  BMI 32.48 kg/m2  SpO2 95%  PHYSICAL EXAM: Pt is alert and oriented, pleasant elderly male in NAD HEENT: normal Neck: JVP - normal, carotids delayed/diminished bilaterally Lungs: CTA bilaterally CV: Irregular with grade 3/6 harsh crescendo decrescendo murmur best heard at the  right upper sternal border Abd: soft, NT, Positive BS, no hepatomegaly Ext: 1+ edema right ankle, nondominant left Skin: warm/dry no rash  EKG:  Sinus rhythm 74 beats per minute with first degree AV block, fusion complexes, and PACs. LVH with QRS widening is present  Cardiac Gated CT 10/21/2012: AORTIC ROOT:  Aortic Valve Description: The aortic valve is tricuspid with  marked thickening and calcification of each of the cusps.  Aortic Valve Area: 0.85 cm-sq  Aortic Annulus (systolic measurents):  Long-axis: 29.9 mm  Short-axis: 25.0 mm  Cross-sectional area: 5.29 cm-sq  Circumference: 84.0 mm  Sinuses of Valsalva (diastolic measurements:  L-SOV - Width: 33.4 mm  Height: 18.9 mm  R-SOV - Width: 33.3 mm  Height: 21.4 mm  Micro-SOV - Width: 36.1 mm  Height: 25.7 mm  Coronary Artery Ostia:  L Main Ostium - 15.4 mm above the annulus.  RCA Ostium - 15.8 mm above the annulus.  OTHER AORTA AND   PULMONARY MEASUREMENTS:  Ascending aorta: (< 40 mm): 36 mm  Descending aorta: (< 40 mm): 31 mm  Main pulmonary artery: (< 30 mm): 25 mm  OTHER FINDINGS:  There is a mosaic appearance of the lung parenchyma, suggesting air  trapping from small airway disease. The lateral segment of the  right middle lobe there is a 4 mm nodule (image 51 of series 4)  which is nonspecific. Architectural distortion in the left lower  lobe likely reflects chronic scarring. No acute consolidative  airspace disease. No pleural effusions. There are some patchy  peripheral areas of subpleural reticulation and ground-glass  attenuation with slight upper lung predominance, which is  nonspecific, but could be indicative of an interstitial lung  disease such as nonspecific interstitial pneumonia (NSIP). There  is also a background of mild centrilobular emphysema and mild  diffuse bronchial wall thickening. There are no aggressive  appearing lytic or blastic lesions noted in the visualized portions  of the skeleton.    IMPRESSION:  1. The severely sclerotic tricuspid aortic valve with estimated  valve area of 0.85 cm2 by planimetry, compatible with severe aortic  stenosis. Aortic root measurements pertinent to potential TAVR  procedure, as detailed above. There are no adverse aortic root  features to suggest potential contraindication to TAVR procedure.  2. Assessment of the coronary arteries was nondiagnostic on this  examination secondary to the small size of the vessels, high  calcification of the vessels, and motion related artifact.  3. The appearance of the lungs suggests some degree of air  trapping for small airway disease. The patient also has diffuse  bronchial wall thickening and mild centrilobular emphysema, imaging  findings compatible with underlying COPD.  4. There is a pattern of some subpleural reticulation with some  patchy peripheral ground-glass attenuation that has a slight upper  lung predominance. 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  5. 4 mm nodule in the lateral segment of the right middle lobe. If  the patient is at high risk for bronchogenic carcinoma, follow-up  chest CT at 1 year is recommended. If the patient is at low risk,  no follow-up is needed. This recommendation follows the consensus  statement: Guidelines for Management of Small Pulmonary Nodules  Detected on CT Scans: A Statement from the Fleischner Society as  published in Radiology 2005; 237:395-400.   CTA Abdomen and Pelvis 10/21/2012: VASCULAR MEASUREMENTS PERTINENT TO TAVR:  AORTA:  Minimal Aortic Diameter - 13 x 11 mm (this is immediately above  the inferior mesenteric artery origin where there is nearly  circumferential thick calcified plaque)  Severity of Aortic Calcification - severe  RIGHT PELVIS:  Right Common Iliac Artery -  Minimal Diameter - 12.8 x 7.0 mm  Tortuosity - moderate  Calcification - moderate  Right External Iliac Artery -   Minimal Diameter - 9.9 x 9.3 mm  Tortuosity - mild  Calcification - none  Right Common Femoral Artery -  Minimal Diameter - 9.0 x 10.1 mm  Tortuosity - minimal  Calcification - mild  LEFT PELVIS:  Left Common Iliac Artery -  Minimal Diameter - 13.0 x 12.4 mm  Tortuosity - mild  Calcification - mild  Left External Iliac Artery -  Minimal Diameter - 9.9 x 9.4 mm  Tortuosity - mild  Calcification - none  Left Common Femoral Artery -  Minimal Diameter - 10.6 x 10.2 mm  Tortuosity - minimal  Calcification - mild    2D Echo 06/18/2014: Study Conclusions  - Left ventricle: The cavity size was normal. Wall thickness was increased in a pattern of moderate LVH. Akinetic inferolateral wall, severely hypokinetic inferior wall and anterolateral wall. The estimated ejection fraction was 35%. Doppler parameters are consistent with abnormal left ventricular relaxation (grade 1 diastolic dysfunction). - Aortic valve: Trileaflet; severely calcified leaflets. There was severe stenosis. There was trivial regurgitation. Mean gradient (S): 60 mm Hg. Peak gradient (S): 76 mm Hg. Valve area (VTI): 0.67 cm^2. - Mitral valve: Moderately calcified annulus. Mildly calcified leaflets . There was mild to moderate regurgitation. - Left atrium: The atrium was moderately dilated. - Right ventricle: The cavity size was normal. Systolic function was normal. - Tricuspid valve: Peak RV-RA gradient 46 mmHg. - Systemic veins: IVC not visualized. - Pericardium, extracardiac: A trivial pericardial effusion was identified.  Impressions:  - Normal LV size with moderate LV hypertrophy. EF 35% with wall motion abnormalities noted above. Severe aortic stenosis. Mild to moderate MR. Normal RV size and systolic function. Moderate pulmonary hypertension.  ASSESSMENT AND PLAN: 1. Severe symptomatic aortic stenosis 2. Ischemic heart disease with moderate-severe LV dysfunction (old inferior MI) 3. Physical  frailty/gait instability 4. PAT maintained on chronic amiodarone 5. Chronic mixed systolic and diastolic heart failure, NYHA functional class III 6. Recurrent GI bleeding when exposed to DAPT  I have personally reviewed the patient's echo images performed earlier today. He has moderate segmental LV dysfunction and progressive severe aortic stenosis with peak and mean transaortic valve gradients of 76 and 60 mm mercury, respectively. There is no question that he has severe symptomatic aortic stenosis. We discussed the natural history of this disease with associated poor prognosis on medical therapy. In the past he and his family/friends have expressed some reluctance to move forward with further interventional procedures, but the patient is increasingly symptomatic and he is interested in considering TAVR. I reviewed the relative pros and cons of TAVR versus an approach of palliative medical therapy with the patient and his friend who was present today. They understand the potential risks of the procedure need to be weighed against the benefits of symptom improvement and potential mortality benefit. If we were to decide to move forward with TAVR, he would require repeat cardiac catheterization. He underwent cardiac and peripheral CTA studies approximately 18 months ago and the abdomen/pelvis CT would also need to be repeated. I think the best course of action is for the patient to return to the multidisciplinary valve clinic with his family and medical power of attorney for further discussion. I would like to see the patient in conjunction with Dr Owen to get his input. We will schedule this at the next available time.  Tulio Facundo MD 06/18/2014 3:46 PM     

## 2014-07-16 NOTE — Interval H&P Note (Signed)
History and Physical Interval Note:  07/16/2014 12:14 PM  Eugene [name]  has presented today for surgery, with the diagnosis of as  The various methods of treatment have been discussed with the patient and family. After consideration of risks, benefits and other options for treatment, the patient has consented to  Procedure(s): LEFT AND RIGHT HEART CATHETERIZATION WITH CORONARY ANGIOGRAM (N/A) as a surgical intervention .  The patient's history has been reviewed, patient examined, no change in status, stable for surgery.  I have reviewed the patient's chart and labs.  Questions were answered to the patient's satisfaction.    We are pursuing TAVR evaluation. Plan for right/left heart cath reviewed with patient.   Tonny BollmanMichael Joie Hipps

## 2014-07-19 ENCOUNTER — Other Ambulatory Visit: Payer: Self-pay | Admitting: *Deleted

## 2014-07-19 ENCOUNTER — Other Ambulatory Visit: Payer: Self-pay | Admitting: Cardiovascular Disease

## 2014-07-19 DIAGNOSIS — I350 Nonrheumatic aortic (valve) stenosis: Secondary | ICD-10-CM

## 2014-07-23 ENCOUNTER — Ambulatory Visit (HOSPITAL_COMMUNITY)
Admission: RE | Admit: 2014-07-23 | Discharge: 2014-07-23 | Disposition: A | Discharge status: Home / Self Care | Payer: Medicare Other | Source: Ambulatory Visit | Attending: Thoracic Surgery (Cardiothoracic Vascular Surgery) | Admitting: Thoracic Surgery (Cardiothoracic Vascular Surgery)

## 2014-07-23 DIAGNOSIS — K5790 Diverticulosis of intestine, part unspecified, without perforation or abscess without bleeding: Secondary | ICD-10-CM | POA: Insufficient documentation

## 2014-07-23 DIAGNOSIS — Z01818 Encounter for other preprocedural examination: Secondary | ICD-10-CM | POA: Diagnosis not present

## 2014-07-23 DIAGNOSIS — I350 Nonrheumatic aortic (valve) stenosis: Secondary | ICD-10-CM | POA: Insufficient documentation

## 2014-07-23 DIAGNOSIS — K8020 Calculus of gallbladder without cholecystitis without obstruction: Secondary | ICD-10-CM | POA: Diagnosis not present

## 2014-07-23 DIAGNOSIS — I517 Cardiomegaly: Secondary | ICD-10-CM | POA: Diagnosis not present

## 2014-07-23 MED ORDER — IOHEXOL 350 MG/ML SOLN
100.0000 mL | Freq: Once | INTRAVENOUS | Status: AC | PRN
  Administered 2014-07-23: 100 mL via INTRAVENOUS

## 2014-07-26 ENCOUNTER — Encounter: Payer: Medicare Other | Admitting: Thoracic Surgery (Cardiothoracic Vascular Surgery)

## 2014-07-29 ENCOUNTER — Telehealth: Payer: Self-pay | Admitting: Cardiovascular Disease

## 2014-07-29 NOTE — Telephone Encounter (Signed)
New message    Caregiver calling     Has questions regarding valve replacement.

## 2014-07-29 NOTE — Telephone Encounter (Signed)
Left message on machine for pt to contact the office.   

## 2014-07-29 NOTE — Telephone Encounter (Signed)
I spoke with Eugene [name] and made him aware that the pt needs to see Dr Laneta SimmersBartle tomorrow as scheduled. I also made him aware that PT would be contacting the pt for evaluation.

## 2014-07-30 ENCOUNTER — Encounter: Payer: Self-pay | Admitting: Surgery

## 2014-07-30 ENCOUNTER — Institutional Professional Consult (permissible substitution) (INDEPENDENT_AMBULATORY_CARE_PROVIDER_SITE_OTHER): Payer: Medicare Other | Admitting: Surgery

## 2014-07-30 VITALS — BP 138/90 | HR 78 | Resp 20 | Ht 67.0 in | Wt 200.0 lb

## 2014-07-30 DIAGNOSIS — I350 Nonrheumatic aortic (valve) stenosis: Secondary | ICD-10-CM

## 2014-07-30 DIAGNOSIS — I5032 Chronic diastolic (congestive) heart failure: Secondary | ICD-10-CM

## 2014-07-30 NOTE — Progress Notes (Signed)
Patient ID: Eugene [name], male   DOB: 1923/10/26, 80 y.o.   MRN: [id number]    [facility]  MULTIDISCIPLINARY HEART VALVE CLINIC    CARDIOTHORACIC SURGERY CONSULTATION REPORT  Referring Provider is Tonny Bollman, MD PCP is Earl Lagos, MD  Chief Complaint  Patient presents with  . Aortic Stenosis    Surgical eval for TAVR    HPI:  The patient is an 80 year old gentleman with a history of coronary artery disease and aortic stenosis who presented in January 2013 with a syncopal episode while carrying in groceries from the car. Echocardiogram demonstrated severe aortic stenosis with moderate to severe left ventricular dysfunction. Left heart catheterization was performed demonstrating severe three-vessel coronary artery disease. The patient had a somewhat complicated hospital course for a variety of reasons including his mental status. A decision was made to treat him with primary balloon valvuloplasty and PCI with a bare-metal stent to the high-grade proximal stenosis of the LAD. The patient did well after these procedures although he did suffer a lower GI bleed felt likely related to diverticular disease. Dual anti-platelet therapy for his stent in the left anterior descending coronary artery was discontinued because of GI bleeding. In July the patient was hospitalized briefly with acute exacerbation of chronic diastolic congestive heart failure, and in August he was seen briefly in the emergency department for orthostatic dizzy spells without syncope. He has had several mechanical falls, although these have primarily been related to poor balance without any history of syncope. He uses a walker at all times. Last month the patient was seen in follow-up by Dr. Excell Seltzer. At that time the patient was noted to have progressive symptoms of exertional shortness of breath. Followup trans-thoracic echocardiogram demonstrated further progression in the severity of aortic stenosis with  peak and mean transvalvular gradients across the aortic valve measured 76 and 60 mm mercury.  He currently lives at home with a younger friend who is his caretaker 24/7. His pastor is his medical power of attorney and with him today. He is still relatively active and gets out to do things. frequently. He uses a walker to ambulate short distances around the house, but for anything beyond that the patient is essentially down to a wheelchair. However, he is able to get up to the bathroom on his own with minimal assistance and overall the patient states that his quality of life is fairly good.     Past Medical History  Diagnosis Date  . Glaucoma     Right eye  . Diverticulosis     multiple hospital admissions for GI bleed, has an episode roughly every 6 months,, last  colonscopy in 2006 showed diverticulosis, was evaluated by Dr. Madilyn Fireman in the hospital in 2011 for lower GI bleed andd it  was suggested that the patient was not actively bleeding at that time and given his comorbid conditions colonoscopy was deferred. He is also chronically constipated  . Macular degeneration of right eye   . Allergic rhinitis   . CAD (coronary artery disease)     s/p PCI in 1991;  Cardiac catheterization 11/02/11: Ostial LAD 95% with aneurysmal dilatation after this, proximal circumflex 70% with a superior branch 30-40%, proximal RCA 75% and 90%, mid RCA 50% and 90% before a PDA, then occluded.;    high risk for CABG;PCI 11/14/11: Bare-metal stent to the proximal LAD    . OSA on CPAP   . Hypertension   . Hyperlipidemia   . Chronic  systolic heart failure     Echocardiogram 11/02/11: Moderate LVH, EF 30-35%, multiple wall motion abnormalities, critical aortic stenosis, AVA 0.6, mean gradient 45, mild MR, severe LAE  . Supraventricular tachycardia     2 syndromes-nonsustained atrial tachycardia//adenosine responsive diuretic positive reentry probably AV node reentry  . Aortic stenosis     s/p AV balloon valvuloplasty by  Dr. Excell Seltzer 11/2011  . Ischemic cardiomyopathy   . H/O: GI bleed     recurrent  . Heart murmur   . CHF (congestive heart failure)   . Pneumonia 2012  . Hypothyroidism   . GERD (gastroesophageal reflux disease)   . Falls frequently     3 times in the past week/notes 09/11/2013  . Myocardial infarction 1991; 10/28/2011  . Exertional shortness of breath     "not much" (09/11/2013)  . Arthritis     "might have slight; have trouble w/my fingers" (09/11/2013)  . Atrial fibrillation   . Peripheral vascular disease     Past Surgical History  Procedure Laterality Date  . Glaucoma surgery      Implantation of Baerveldt glaucoma device lant with scleral reinforcement using tutoplast tissue graft right eye. [Other]  . Coronary angioplasty with stent placement  Jan. 23, 2013  . Corneal transplant Right   . Cardiac valve replacement  11/30/2011    aortic valve  . Angioplasty Left 03/30/2013    tibial  . Tonsillectomy  1946  . Cataract extraction w/ intraocular lens  implant, bilateral Bilateral     Family History  Problem Relation Age of Onset  . Cancer Mother     Ovarian Cancer  . Cancer Father     Lung  . Heart disease Sister     Heart disease before age 87    History   Social History  . Marital Status: Widowed    Spouse Name: N/A    Number of Children: N/A  . Years of Education: N/A   Occupational History  . Not on file.   Social History Main Topics  . Smoking status: Former Smoker -- 3.00 packs/day for 21 years    Types: Cigarettes    Quit date: 10/08/1958  . Smokeless tobacco: Never Used  . Alcohol Use: No  . Drug Use: No  . Sexual Activity: No   Other Topics Concern  . Not on file   Social History Narrative   Lives at the [facility]. His pastor is his HCPOA  [facility]) [phone]). His daugther has been removed because she use $500k from his estate without his approval.  He also has a close friend who takes him for his doctor's  appointments and is with him during the hospital admission. 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 Never smoked or drank alcohol in past 40 years. No illicit drug use. Has Medicare.    Current Outpatient Prescriptions  Medication Sig Dispense Refill  . amiodarone (PACERONE) 200 MG tablet TAKE 1 TABLET BY MOUTH ONCE DAILY  30 tablet  11  . aspirin EC 81 MG tablet Take 81 mg by mouth daily.      . Calcium Carb-Cholecalciferol (CALCIUM 600+D) 600-800 MG-UNIT TABS Take 1 tablet by mouth daily.      . ferrous sulfate 325 (65 FE) MG tablet Take 325 mg by mouth daily with breakfast.      . furosemide (LASIX) 20 MG tablet Take 1 tablet (20 mg total) by mouth every morning.  30 tablet  6  . levothyroxine (SYNTHROID, LEVOTHROID) 25 MCG tablet Take 25 mcg by mouth daily before breakfast.      . losartan (COZAAR) 50 MG tablet Take 50 mg by mouth daily.      . Multiple Vitamin (MULTIVITAMIN) tablet Take 1 tablet by mouth at bedtime.      Marland Kitchen oxybutynin (DITROPAN) 5 MG tablet Take 5 mg by mouth 2 (two) times daily.      . pantoprazole (PROTONIX) 40 MG tablet Take 40 mg by mouth daily.      . pravastatin (PRAVACHOL) 40 MG tablet Take 1 tablet (40 mg total) by mouth daily.  90 tablet  4  . spironolactone (ALDACTONE) 25 MG tablet Take 12.5 mg by mouth every other day. Patient taking one half tablet every other day.      . terazosin (HYTRIN) 1 MG capsule Take 1 mg by mouth at bedtime.      . vitamin B-12 (CYANOCOBALAMIN) 1000 MCG tablet Take 1,000 mcg by mouth daily.       No current facility-administered medications for this visit.    Allergies  Allergen Reactions  . Penicillins Swelling and Other (See Comments)    REACTION: "turns red"      Review of Systems:  General: good appetite, decreased energy, no weight gain, no weight loss, no fever  Cardiac: no chest pain with exertion, no chest pain at rest, + SOB with minimal exertion, no resting SOB, no PND,  no orthopnea, no palpitations, + arrhythmia, + atrial fibrillation, + LE edema, + dizzy spells, no recent syncope  Respiratory: + shortness of breath, no home oxygen, no productive cough, + intermittent dry cough, no bronchitis, no wheezing, no hemoptysis, no asthma, no pain with inspiration or cough, + sleep apnea, + CPAP at night  GI: no difficulty swallowing, no reflux, no frequent heartburn, no hiatal hernia, no abdominal pain, no constipation, no diarrhea, no hematochezia, no hematemesis, no melena  GU: no dysuria, + frequency, no urinary tract infection, no hematuria, no enlarged prostate, no kidney stones, + kidney disease  Vascular: no pain suggestive of claudication, no pain in feet, + occasional leg cramps, no varicose veins, no DVT, + non-healing foot ulcer  Neuro: no stroke, no TIA's, no seizures, no headaches, no temporary blindness one eye, no slurred speech, + peripheral neuropathy, no chronic pain, + instability of gait, no memory/cognitive dysfunction  Musculoskeletal: mild arthritis, no joint swelling, no myalgias, + difficulty walking, very limited mobility  Skin: no rash, no itching, no skin infections, no pressure sores or ulcerations  Psych: no anxiety, no depression, no nervousness, no unusual recent stress  Eyes: no blurry vision, no floaters, no recent vision changes, + wears glasses or contacts  ENT: + hearing loss, no loose or painful teeth, + dentures  Hematologic: + easy bruising, no abnormal bleeding, no clotting disorder, no frequent epistaxis  Endocrine: no diabetes, does not check CBG's at home       Physical Exam:   BP 138/90  Pulse 78  Resp 20  Ht 5\' 7"  (1.702 m)  Wt 200 lb (90.719 kg)  BMI 31.32 kg/m2  SpO2 94%  General:  Elderly frail-appearing gentleman in no distress.  HEENT:  NCAT, PERLA, EOMI, wears hearing aids, dentures  Neck:   no JVD, no bruits, no adenopathy or thyromegaly  Chest:   clear to auscultation, symmetrical breath sounds, no wheezes,  no  rhonchi   CV:   RRR, grade III/VI crescendo/decrescendo murmur heard best at                                                       RSB,  no diastolic murmur  Abdomen:  soft, non-tender, no masses or organomegaly  Extremities:  warm, well-perfused, pulses not palpable, minimal LE edema  Rectal/GU  Deferred  Neuro:   Grossly non-focal and symmetrical throughout  Skin:   Clean and dry, no rashes, no breakdown   Diagnostic Tests:  Transthoracic Echocardiography  Patient: Barrie Dunkerhomas, Wil N MR #: [id number] Study Date: 06/18/2014 Gender: M Age: [age] Height: 170.2 cm Weight: 97.5 kg BSA: 2.18 m^2 Pt. Status: Room:  ORDERING Tonny BollmanMichael Cooper REFERRING Tonny BollmanMichael Cooper ATTENDING Marca Anconaalton McLean, M.D. SONOGRAPHER Aida RaiderNaTashia Rodgers, RDCS PERFORMING [facility], Outpatient  cc:  ------------------------------------------------------------------- LV EF: 35%  ------------------------------------------------------------------- Indications: 424.1 Aortic valve disorders.  ------------------------------------------------------------------- History: PMH: Acquired from the patient and from the patient&'s chart. PMH: Peripheral Vascular Disease. Paroxysmal Atrial Fibrillation. Aortic Stenosis. CAD. Chronic Venous Insufficiency. Obstructive Sleep Apnea-CPAP. Bradycardia. Supraventricular Tachycardia. Murmur. Ischemic Cardiomyopathy. MI. Risk factors: Hypertension. Obese. Dyslipidemia.  ------------------------------------------------------------------- Study Conclusions  - Left ventricle: The cavity size was normal. Wall thickness was increased in a pattern of moderate LVH. Akinetic inferolateral wall, severely hypokinetic inferior wall and anterolateral wall. The estimated ejection fraction was 35%. Doppler parameters are consistent with abnormal left ventricular relaxation (grade 1 diastolic dysfunction). - Aortic valve: Trileaflet; severely  calcified leaflets. There was severe stenosis. There was trivial regurgitation. Mean gradient (S): 60 mm Hg. Peak gradient (S): 76 mm Hg. Valve area (VTI): 0.67 cm^2. - Mitral valve: Moderately calcified annulus. Mildly calcified leaflets . There was mild to moderate regurgitation. - Left atrium: The atrium was moderately dilated. - Right ventricle: The cavity size was normal. Systolic function was normal. - Tricuspid valve: Peak RV-RA gradient 46 mmHg. - Systemic veins: IVC not visualized. - Pericardium, extracardiac: A trivial pericardial effusion was identified.  Impressions:  - Normal LV size with moderate LV hypertrophy. EF 35% with wall motion abnormalities noted above. Severe aortic stenosis. Mild to moderate MR. Normal RV size and systolic function. Moderate pulmonary hypertension.  ------------------------------------------------------------------- Labs, prior tests, procedures, and surgery: Status post Aortic Valve Balloon Valvuloplasty. Status post PCI-Bare Metal Stent to Proximal LAD. Transthoracic echocardiography. M-mode, complete 2D, spectral Doppler, and color Doppler. Birthdate: Patient birthdate: Apr 13, 1924. Age: Patient is 80 yr old. Sex: Gender: male. BMI: 33.7 kg/m^2. Blood pressure: 116/78 Patient status: Outpatient. Study date: Study date: 06/18/2014. Study time: 02:16 PM. Location: [facility]  -------------------------------------------------------------------  ------------------------------------------------------------------- Left ventricle: The cavity size was normal. Wall thickness was increased in a pattern of moderate LVH. Akinetic inferolateral wall, severely hypokinetic inferior wall and anterolateral wall. The estimated ejection fraction was 35%. Doppler parameters are consistent with abnormal left ventricular relaxation (grade 1 diastolic dysfunction).  ------------------------------------------------------------------- Aortic valve:  Trileaflet; severely calcified leaflets. Doppler: There was severe stenosis. There was trivial regurgitation. VTI ratio of LVOT to aortic valve: 0.2. Valve area (VTI): 0.67 cm^2. Indexed valve area (VTI): 0.31 cm^2/m^2. Peak velocity ratio of LVOT to aortic valve: 0.16. Valve area (Vmax): 0.6 cm^2. Indexed valve area (Vmax): 0.27 cm^2/m^2. Mean velocity ratio of LVOT to aortic valve: 0.13. Valve area (Vmean): 0.51 cm^2. Indexed valve area (Vmean): 0.23 cm^2/m^2. Mean gradient (  S): 60 mm Hg. Peak gradient (S): 76 mm Hg.  ------------------------------------------------------------------- Aorta: Aortic root: The aortic root was normal in size. Ascending aorta: The ascending aorta was normal in size.  ------------------------------------------------------------------- Mitral valve: Moderately calcified annulus. Mildly calcified leaflets . Doppler: There was no evidence for stenosis. There was mild to moderate regurgitation. Peak gradient (D): 3 mm Hg.  ------------------------------------------------------------------- Left atrium: The atrium was moderately dilated.  ------------------------------------------------------------------- Right ventricle: The cavity size was normal. Systolic function was normal.  ------------------------------------------------------------------- Pulmonic valve: Structurally normal valve. Cusp separation was normal. Doppler: Transvalvular velocity was within the normal range. There was trivial regurgitation.  ------------------------------------------------------------------- Tricuspid valve: Doppler: There was trivial regurgitation. Peak RV-RA gradient 46 mmHg.  ------------------------------------------------------------------- Right atrium: The atrium was normal in size.  ------------------------------------------------------------------- Pericardium: A trivial pericardial effusion was  identified.  ------------------------------------------------------------------- Systemic veins: IVC not visualized.  ------------------------------------------------------------------- Measurements  Left ventricle Value Reference LV ID, ED, PLAX chordal (H) 56 mm 43 - 52 LV ID, ES, PLAX chordal (H) 47 mm 23 - 38 LV fx shortening, PLAX chordal (L) 16 % >=29 LV PW thickness, ED 14 mm --------- IVS/LV PW ratio, ED 1.21 <=1.3 Stroke volume, 2D 71 ml --------- Stroke volume/bsa, 2D 33 ml/m^2 --------- LV e&', lateral 5.87 cm/s --------- LV E/e&', lateral 13.51 --------- LV e&', medial 4.35 cm/s --------- LV E/e&', medial 18.23 --------- LV e&', average 5.11 cm/s --------- LV E/e&', average 15.52 ---------  Ventricular septum Value Reference IVS thickness, ED 17 mm ---------  LVOT Value Reference LVOT ID, S 22 mm --------- LVOT area 3.8 cm^2 --------- LVOT peak velocity, S 70.5 cm/s --------- LVOT mean velocity, S 50.2 cm/s --------- LVOT VTI, S 21 cm ---------  Aortic valve Value Reference Aortic valve peak velocity, S 437 cm/s --------- Aortic valve mean velocity, S 374 cm/s --------- Aortic valve VTI, S 103 cm --------- Aortic mean gradient, S 60 mm Hg --------- Aortic peak gradient, S 76 mm Hg --------- VTI ratio, LVOT/AV 0.2 --------- Aortic valve area, VTI 0.67 cm^2 --------- Aortic valve area/bsa, VTI 0.31 cm^2/m^2 --------- Velocity ratio, peak, LVOT/AV 0.16 --------- Aortic valve area, peak velocity 0.6 cm^2 --------- Aortic valve area/bsa, peak 0.27 cm^2/m^2 --------- velocity Velocity ratio, mean, LVOT/AV 0.13 --------- Aortic valve area, mean velocity 0.51 cm^2 --------- Aortic valve area/bsa, mean 0.23 cm^2/m^2 --------- velocity Aortic regurg pressure half-time 1084 ms ---------  Aorta Value Reference Aortic root ID, ED 33 mm ---------  Left atrium Value Reference LA ID, A-P, ES 54 mm --------- LA ID/bsa, A-P (H) 2.47 cm/m^2 <=2.2  Mitral valve  Value Reference Mitral E-wave peak velocity 79.3 cm/s --------- Mitral A-wave peak velocity 111 cm/s --------- Mitral deceleration time 183 ms 150 - 230 Mitral peak gradient, D 3 mm Hg --------- Mitral E/A ratio, peak 0.7 --------- Mitral maximal regurg velocity, 706 cm/s --------- PISA  Right ventricle Value Reference RV s&', lateral, S 13.6 cm/s ---------  Legend: (L) and (H) mark values outside specified reference range.  ------------------------------------------------------------------- Prepared and Electronically Authenticated by  Marca Anconaalton McLean, M.D. 2015-09-11T18:25:14    *RADIOLOGY REPORT*  INDICATION: 80 year old male with severe aortic stenosis.  Preprocedural study for potential transcatheter aortic valve  replacement (TAVR) procedure.  CT ANGIOGRAPHY OF THE HEART, CORONARY ARTERY, STRUCTURE, AND  MORPHOLOGY  COMPARISON: No priors.  CONTRAST: 80mL OMNIPAQUE IOHEXOL 350 MG/ML SOLN  TECHNIQUE: CT angiography of the coronary vessels was performed on  a 256 channel system using prospective ECG gating. A scout and ECG-  gated noncontrast exam (for calcium scoring) were  performed.  Appropriate delay was determined by bolus tracking after injection  of iodinated contrast, and an ECG-gated coronary CTA was performed  with sub-mm slice collimation throughout the cardiac cycle.  Imaging post processing was performed on an independent workstation  creating multiplanar and 3-D images, allowing for quantitative  analysis of the heart and coronary arteries. Note that this exam  targets the heart and the chest was not imaged in its entirety.  PREMEDICATION:  None  FINDINGS:  Technical quality: Good.  Heart rate: 55.  CORONARY ARTERIES: Assessment of the coronary arteries is  nondiagnostic secondary to the small caliber and high calcification  of the coronary arteries, as well as some motion related artifact,  which limits accurate assessment for percentage of luminal   stenosis.  AORTIC ROOT:  Aortic Valve Description: The aortic valve is tricuspid with  marked thickening and calcification of each of the cusps.  Aortic Valve Area: 0.85 cm-sq  Aortic Annulus (systolic measurents):  Long-axis: 29.9 mm  Short-axis: 25.0 mm  Cross-sectional area: 5.29 cm-sq  Circumference: 84.0 mm  Sinuses of Valsalva (diastolic measurements:  L-SOV - Width: 33.4 mm  Height: 18.9 mm  R-SOV - Width: 33.3 mm  Height: 21.4 mm  [place]-SOV - Width: 36.1 mm  Height: 25.7 mm  Coronary Artery [place]ia:  L Main Ostium - 15.4 mm above the annulus.  RCA Ostium - 15.8 mm above the annulus.  OTHER AORTA AND PULMONARY MEASUREMENTS:  Ascending aorta: (< 40 mm): 36 mm  Descending aorta: (< 40 mm): 31 mm  Main pulmonary artery: (< 30 mm): 25 mm  OTHER FINDINGS:  There is a mosaic appearance of the lung parenchyma, suggesting air  trapping from small airway disease. The lateral segment of the  right middle lobe there is a 4 mm nodule (image 51 of series 4)  which is nonspecific. Architectural distortion in the left lower  lobe likely reflects chronic scarring. No acute consolidative  airspace disease. No pleural effusions. There are some patchy  peripheral areas of subpleural reticulation and ground-glass  attenuation with slight upper lung predominance, which is  nonspecific, but could be indicative of an interstitial lung  disease such as nonspecific interstitial pneumonia (NSIP). There  is also a background of mild centrilobular emphysema and mild  diffuse bronchial wall thickening. There are no aggressive  appearing lytic or blastic lesions noted in the visualized portions  of the skeleton.  IMPRESSION:  1. The severely sclerotic tricuspid aortic valve with estimated  valve area of 0.85 cm2 by planimetry, compatible with severe aortic  stenosis. Aortic root measurements pertinent to potential TAVR  procedure, as detailed above. There are no adverse aortic root  features to  suggest potential contraindication to TAVR procedure.  2. Assessment of the coronary arteries was nondiagnostic on this  examination secondary to the small size of the vessels, high  calcification of the vessels, and motion related artifact.  3. The appearance of the lungs suggests some degree of air  trapping for small airway disease. The patient also has diffuse  bronchial wall thickening and mild centrilobular emphysema, imaging  findings compatible with underlying COPD.  4. There is a pattern of some subpleural reticulation with some  patchy peripheral ground-glass attenuation that has a slight upper  lung predominance. 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  5. 4 mm nodule in the lateral segment of the right middle lobe. If  the patient is at high risk  for bronchogenic carcinoma, follow-up  chest CT at 1 year is recommended. If the patient is at low risk,  no follow-up is needed. This recommendation follows the consensus  statement: Guidelines for Management of Small Pulmonary Nodules  Detected on CT Scans: A Statement from the Fleischner Society as  published in Radiology 2005; 237:395-400.  Original Report Authenticated By: Trudie Reed, M.D.    CLINICAL DATA: 80 year old male with history of severe aortic  stenosis. Preprocedural study prior to potential transcatheter  aortic valve replacement (TAVR).  EXAM:  CT ANGIOGRAPHY CHEST, ABDOMEN AND PELVIS  TECHNIQUE:  Multidetector CT imaging through the chest, abdomen and pelvis was  performed using the standard protocol during bolus administration of  intravenous contrast. Multiplanar reconstructed images and MIPs were  obtained and reviewed to evaluate the vascular anatomy.  CONTRAST: OMNIPAQUE IOHEXOL 350 MG/ML SOLN  COMPARISON: CT of the abdomen and pelvis 10/21/2012.  FINDINGS:  CT CHEST FINDINGS  Mediastinum: Heart size is mildly enlarged with mild left   ventricular dilatation, and areas of myocardial thinning involving  the inferolateral wall of the left ventricle, compatible with  scarring from prior myocardial infarction. There is atherosclerosis  of the thoracic aorta, the great vessels of the mediastinum and the  coronary arteries, including calcified atherosclerotic plaque in the  left main, left anterior descending, left circumflex and right  coronary arteries. Severe thickening and calcification of the aortic  valve. There is no significant pericardial fluid, thickening or  pericardial calcification. No pathologically enlarged mediastinal or  hilar lymph nodes. Small hiatal hernia. Proximal right subclavian  artery aneurysm measuring up to 1.9 cm in diameter.  Lungs/Pleura: Extensive scarring throughout the basal segments of  the lower lobes of the lungs bilaterally. There is a background of  mild diffuse ground-glass attenuation with mild interlobular septal  thickening, favored to reflect mild interstitial pulmonary edema.  Trace bilateral pleural effusions layering dependently.  Musculoskeletal: Old compression fracture of T5 with approximately  15% loss of anterior vertebral body height. There are no aggressive  appearing lytic or blastic lesions noted in the visualized portions  of the skeleton.  CT ABDOMEN AND PELVIS FINDINGS  Hepatobiliary: 8 mm partially calcified gallstone in the dependent  portion of the gallbladder. No current findings to suggest an acute  cholecystitis at this time. Multiple low-attenuation hepatic  lesions, many of which are too small to definitively characterize.  These lesions are generally similar in size, number and pattern of  distribution compared to the prior study, compatible with benign  lesions such as simple cysts. The largest of these lesions are all  compatible with simple cysts, including a 2.6 cm lesion in the  periphery of segment for a. One of the lesions previously noted in  the  lateral aspect of segment 2 has regressed, presumably secondary  to spontaneous involution. No intra or extrahepatic biliary ductal  dilatation.  Pancreas: Unremarkable.  Spleen: Unremarkable.  Adrenals/Urinary Tract: 1.2 x 0.9 cm right adrenal nodule is  unchanged, and although incompletely characterized is favored to be  benign lesions such as a small had adenoma. Minimal thickening of  the left adrenal gland is also unchanged, potentially related of  mild adrenal hyperplasia. There are multiple sub cm low-attenuation  lesions in the kidneys bilaterally which are too small to  definitively characterize, but stable compared to the prior study,  favored to represent tiny cysts. In addition, there is an exophytic  2 cm low-attenuation lesion lower pole of the right kidney, which  again has some dependent high attenuation debris, compatible with a  minimally complex cyst containing proteinaceous or hemorrhagic  debris.  Stomach/Bowel: Small hiatal hernia ; the stomach is otherwise normal  in appearance. No pathologic dilatation of the small bowel or colon.  Numerous colonic diverticulae are noted, most severe in the  descending and sigmoid colon, without surrounding inflammatory  changes to suggest an acute diverticulitis at this time. Normal  appendix.  Vascular/Lymphatic: Extensive atherosclerosis throughout the  abdominal and pelvic vasculature, with vascular findings and  measurements pertinent to potential TAVR procedure, as detailed  below. Image 188 of series 4 again demonstrates a small ulcerated  plaque of the infrarenal abdominal aorta immediately above the IMA  origin (unchanged). No pathologically enlarged lymph nodes in the  abdomen or pelvis.  Reproductive: Prostate gland is unremarkable in appearance.  Other: No significant volume of ascites. No pneumoperitoneum.  Musculoskeletal: Multiple old vertebral body compression fractures  in the lumbar spine appear unchanged,  most severe at L3 where there  is 70% loss of anterior vertebral body height and 30% loss of  posterior vertebral body height. 5 mm of anterolisthesis of L4 upon  L5 is unchanged. There are no aggressive appearing lytic or blastic  lesions noted in the visualized portions of the skeleton.  Well-defined sclerotic lesion in the right side of the ileum is  unchanged, compatible with a bone island.  VASCULAR MEASUREMENTS PERTINENT TO TAVR:  AORTA:  Minimal Aortic Diameter - 13 x 9 mm in the infrarenal abdominal  aorta at the level of the IMA origin  Severity of Aortic Calcification - severe  RIGHT PELVIS:  Right Common Iliac Artery -  Minimal Diameter - 12.9 x 7.5 mm  Tortuosity - moderate  Calcification - moderate  Right External Iliac Artery -  Minimal Diameter - 9.5 x 10.9 mm  Tortuosity - severe  Calcification - mild  Right Common Femoral Artery -  Minimal Diameter - 10.0 x 7.3 mm  Tortuosity - mild  Calcification - moderate  LEFT PELVIS:  Left Common Iliac Artery -  Minimal Diameter - 11.7 x 10.3 mm  Tortuosity - moderate  Calcification - mild  Left External Iliac Artery -  Minimal Diameter - 9.1 x 9.9 mm  Tortuosity - moderate  Calcification - mild  Left Common Femoral Artery -  Minimal Diameter - 10.0 x 8.3 mm  Tortuosity - mild  Calcification - moderate  Review of the MIP images confirms the above findings.  IMPRESSION:  1. Vascular findings and measurements pertinent to potential TAVR  procedure, as detailed above. This patient does appear to have  suitable pelvic arterial access.  2. Cardiomegaly with mild left ventricular dilatation and scarring  in the inferolateral wall of the left ventricle, presumably from  prior myocardial infarction. At this time, there is evidence of mild  interstitial pulmonary edema with trace bilateral pleural effusions,  suggesting mild congestive heart failure.  3. Extensive colonic diverticulosis without findings to suggest  acute  diverticulitis at this time.  4. Cholelithiasis without evidence of acute cholecystitis at this  time.  5. Additional incidental findings, similar to the prior examination,  as detailed above.  Electronically Signed  By: Trudie Reed M.D.  On: 07/23/2014 12:07     STS Risk Calculator   Procedure AVR + CABG  Risk of Mortality 13.1%  Morbidity or Mortality 51.9%  Prolonged LOS 34.2%  Short LOS 4.9%  Permanent Stroke 5.2%  Prolonged Vent Support 37.1%  DSW Infection 0.9%  Renal Failure 17.5%  Reoperation 19.0%    Impression:  He has severe, symptomatic aortic stenosis with moderate left ventricular systolic function and history of multivessel coronary artery disease with moderate stenosis of the LCX and severe diffuse RCA stenosis with left to right collaterals . He has multiple comorbid medical problems and very limited physical mobility which would make conventional surgical aortic valve replacement with or without coronary artery bypass surgery extremely high risk. I agree with Dr. Cornelius Moras that the patient should not be considered a candidate for conventional surgery. He did have significant improvement with BAV previously and is still fairly functional for 80 years old with a good support system. I think TAVR is a good alternative for him. I discussed the alternative of palliative medical therapy and he wants to pursue TAVR if we think it may improve his quality of life and relieve his exertional shortness of breath. I discussed the expectations for his recovery and the risks of surgery in detail . I answered all of the questions that he and his medical POA have.  During the course of the patient's preoperative work up they have been evaluated comprehensively by a multidisciplinary team of specialists coordinated through the [facility] in the [facility].  He has been demonstrated to suffer from symptomatic severe aortic stenosis as  noted above. The patient has been counseled extensively as to the relative risks and benefits of all options for the treatment of severe aortic stenosis including long term medical therapy, conventional surgery for aortic valve replacement, and transcatheter aortic valve replacement.  The patient has been independently evaluated by two cardiac surgeons including myself and Dr. Cornelius Moras as well as Dr. Excell Seltzer of interventional cardiology and he is felt to be at high risk for conventional surgical aortic valve replacement and CABG based upon a predicted risk of mortality using the Society of Thor[employer] Surgeons risk calculator of 13%.    Based upon review of all of the patient's preoperative diagnostic tests they are felt to be candidate for transcatheter aortic valve replacement using the transfemoral approach as an alternative to high risk conventional surgery.    Following the decision to proceed with transcatheter aortic valve replacement, a discussion has been held regarding what types of management strategies would be attempted intraoperatively in the event of life-threatening complications, including whether or not the patient would be considered a candidate for the use of cardiopulmonary bypass and/or conversion to open sternotomy for attempted surgical intervention.  The patient has been advised of a variety of complications that might develop including but not limited to risks of death, stroke, paravalvular leak, aortic dissection or other major vascular complications, aortic annulus rupture, device embolization, cardiac rupture or perforation, mitral regurgitation, acute myocardial infarction, arrhythmia, heart block or bradycardia requiring permanent pacemaker placement, congestive heart failure, respiratory failure, renal failure, pneumonia, infection, other late complications related to structural valve deterioration or migration, or other complications that might ultimately cause a temporary or permanent  loss of functional independence or other long term morbidity.  The patient provides full informed consent for the procedure as described and all questions were answered.  I answered all of the questions that he and his medical POA have.  Plan:  TAVR on Tuesday 08/10/2014.    Alleen Borne, MD 07/30/2014

## 2014-08-02 ENCOUNTER — Other Ambulatory Visit: Payer: Self-pay | Admitting: *Deleted

## 2014-08-02 ENCOUNTER — Encounter (HOSPITAL_COMMUNITY): Payer: Self-pay | Admitting: Pharmacy Technician

## 2014-08-02 ENCOUNTER — Encounter: Payer: Medicare Other | Admitting: Thoracic Surgery (Cardiothoracic Vascular Surgery)

## 2014-08-02 DIAGNOSIS — I350 Nonrheumatic aortic (valve) stenosis: Secondary | ICD-10-CM

## 2014-08-06 ENCOUNTER — Ambulatory Visit (HOSPITAL_COMMUNITY)
Admission: RE | Admit: 2014-08-06 | Discharge: 2014-08-06 | Disposition: A | Discharge status: Home / Self Care | Payer: Medicare Other | Source: Ambulatory Visit | Attending: Cardiovascular Disease | Admitting: Cardiovascular Disease

## 2014-08-06 ENCOUNTER — Other Ambulatory Visit (HOSPITAL_COMMUNITY): Payer: Self-pay | Admitting: *Deleted

## 2014-08-06 ENCOUNTER — Encounter (HOSPITAL_COMMUNITY): Payer: Self-pay

## 2014-08-06 ENCOUNTER — Encounter (HOSPITAL_COMMUNITY)
Admission: RE | Admit: 2014-08-06 | Discharge: 2014-08-06 | Disposition: A | Discharge status: Home / Self Care | Payer: Medicare Other | Source: Ambulatory Visit | Attending: Cardiovascular Disease | Admitting: Cardiovascular Disease

## 2014-08-06 VITALS — BP 124/77 | HR 76 | Temp 98.2°F | Resp 18 | Ht 67.0 in | Wt 200.2 lb

## 2014-08-06 DIAGNOSIS — Z79899 Other long term (current) drug therapy: Secondary | ICD-10-CM | POA: Diagnosis not present

## 2014-08-06 DIAGNOSIS — I480 Paroxysmal atrial fibrillation: Secondary | ICD-10-CM | POA: Insufficient documentation

## 2014-08-06 DIAGNOSIS — I350 Nonrheumatic aortic (valve) stenosis: Secondary | ICD-10-CM

## 2014-08-06 DIAGNOSIS — Z952 Presence of prosthetic heart valve: Secondary | ICD-10-CM | POA: Insufficient documentation

## 2014-08-06 DIAGNOSIS — F17210 Nicotine dependence, cigarettes, uncomplicated: Secondary | ICD-10-CM | POA: Insufficient documentation

## 2014-08-06 DIAGNOSIS — I5043 Acute on chronic combined systolic (congestive) and diastolic (congestive) heart failure: Secondary | ICD-10-CM | POA: Insufficient documentation

## 2014-08-06 DIAGNOSIS — I739 Peripheral vascular disease, unspecified: Secondary | ICD-10-CM | POA: Diagnosis not present

## 2014-08-06 LAB — PULMONARY FUNCTION TEST
DL/VA % pred: 82 %
DL/VA: 3.59 ml/min/mmHg/L
DLCO COR: 14.64 ml/min/mmHg
DLCO cor % pred: 51 %
DLCO unc % pred: 51 %
DLCO unc: 14.64 ml/min/mmHg
FEF 25-75 PRE: 2.15 L/s
FEF 25-75 Post: 1.37 L/sec
FEF2575-%CHANGE-POST: -36 %
FEF2575-%Pred-Post: 105 %
FEF2575-%Pred-Pre: 165 %
FEV1-%CHANGE-POST: -5 %
FEV1-%PRED-PRE: 103 %
FEV1-%Pred-Post: 97 %
FEV1-Post: 2.09 L
FEV1-Pre: 2.22 L
FEV1FVC-%Change-Post: 5 %
FEV1FVC-%Pred-Pre: 111 %
FEV6-%Change-Post: -9 %
FEV6-%Pred-Post: 87 %
FEV6-%Pred-Pre: 97 %
FEV6-POST: 2.54 L
FEV6-Pre: 2.81 L
FEV6FVC-%Change-Post: 0 %
FEV6FVC-%PRED-PRE: 109 %
FEV6FVC-%Pred-Post: 109 %
FVC-%Change-Post: -10 %
FVC-%PRED-PRE: 89 %
FVC-%Pred-Post: 80 %
FVC-POST: 2.54 L
FVC-PRE: 2.84 L
POST FEV1/FVC RATIO: 82 %
POST FEV6/FVC RATIO: 100 %
Pre FEV1/FVC ratio: 78 %
Pre FEV6/FVC Ratio: 99 %
RV % pred: 92 %
RV: 2.45 L
TLC % pred: 79 %
TLC: 5.15 L

## 2014-08-06 LAB — COMPREHENSIVE METABOLIC PANEL
ALBUMIN: 3.7 g/dL (ref 3.5–5.2)
ALT: 11 U/L (ref 0–53)
AST: 16 U/L (ref 0–37)
Alkaline Phosphatase: 77 U/L (ref 39–117)
Anion gap: 15 (ref 5–15)
BILIRUBIN TOTAL: 0.5 mg/dL (ref 0.3–1.2)
BUN: 22 mg/dL (ref 6–23)
CALCIUM: 9.5 mg/dL (ref 8.4–10.5)
CHLORIDE: 103 meq/L (ref 96–112)
CO2: 20 meq/L (ref 19–32)
CREATININE: 0.94 mg/dL (ref 0.50–1.35)
GFR calc Af Amer: 84 mL/min — ABNORMAL LOW (ref >90)
GFR calc non Af Amer: 72 mL/min — ABNORMAL LOW (ref >90)
Glucose, Bld: 106 mg/dL — ABNORMAL HIGH (ref 70–99)
Potassium: 4.2 mEq/L (ref 3.7–5.3)
Sodium: 138 mEq/L (ref 137–147)
Total Protein: 6.4 g/dL (ref 6.0–8.3)

## 2014-08-06 LAB — BLOOD GAS, ARTERIAL
Acid-base deficit: 1.3 mmol/L (ref 0.0–2.0)
Bicarbonate: 22.4 mEq/L (ref 20.0–24.0)
Drawn by: 42180
FIO2: 0.21 %
O2 SAT: 90.9 %
PCO2 ART: 34.2 mmHg — AB (ref 35.0–45.0)
Patient temperature: 98.6
TCO2: 23.4 mmol/L (ref 0–100)
pH, Arterial: 7.431 (ref 7.350–7.450)
pO2, Arterial: 63.9 mmHg — ABNORMAL LOW (ref 80.0–100.0)

## 2014-08-06 LAB — APTT: APTT: 30 s (ref 24–37)

## 2014-08-06 LAB — URINALYSIS, ROUTINE W REFLEX MICROSCOPIC
Bilirubin Urine: NEGATIVE
GLUCOSE, UA: NEGATIVE mg/dL
HGB URINE DIPSTICK: NEGATIVE
KETONES UR: NEGATIVE mg/dL
Leukocytes, UA: NEGATIVE
Nitrite: NEGATIVE
PH: 5.5 (ref 5.0–8.0)
Protein, ur: NEGATIVE mg/dL
Specific Gravity, Urine: 1.015 (ref 1.005–1.030)
Urobilinogen, UA: 0.2 mg/dL (ref 0.0–1.0)

## 2014-08-06 LAB — CBC
HEMATOCRIT: 47.4 % (ref 39.0–52.0)
Hemoglobin: 16 g/dL (ref 13.0–17.0)
MCH: 31.4 pg (ref 26.0–34.0)
MCHC: 33.8 g/dL (ref 30.0–36.0)
MCV: 93.1 fL (ref 78.0–100.0)
Platelets: 151 10*3/uL (ref 150–400)
RBC: 5.09 MIL/uL (ref 4.22–5.81)
RDW: 14.3 % (ref 11.5–15.5)
WBC: 8.4 10*3/uL (ref 4.0–10.5)

## 2014-08-06 LAB — PROTIME-INR
INR: 1.05 (ref 0.00–1.49)
Prothrombin Time: 13.8 seconds (ref 11.6–15.2)

## 2014-08-06 LAB — SURGICAL PCR SCREEN
MRSA, PCR: NEGATIVE
Staphylococcus aureus: POSITIVE — AB

## 2014-08-06 MED ORDER — ALBUTEROL SULFATE (2.5 MG/3ML) 0.083% IN NEBU
2.5000 mg | INHALATION_SOLUTION | Freq: Once | RESPIRATORY_TRACT | Status: AC
  Administered 2014-08-06: 2.5 mg via RESPIRATORY_TRACT

## 2014-08-06 NOTE — Pre-Procedure Instructions (Addendum)
Eugene [name]  08/06/2014   Your procedure is scheduled on:  Tuesday, August 10, 2014    [facility]" Admitting Office at 5:30 AM.   Call this number if you have problems the morning of surgery: [phone]   Remember:   Do not eat food or drink liquids after midnight Monday, 08/09/14.   Take these medicines the morning of surgery with A SIP OF WATER: amiodarone (PACERONE), levothyroxine (SYNTHROID, LEVOTHROID), pantoprazole (PROTONIX).ditropan    Take all meds as ordered until day of surgery except as instructed below or per dr   Eugene AriasSTOP all herbel meds, nsaids (aleve,naproxen,advil,ibuprofen) 5 days prior to surgery starting now including vitamins,   Do not wear jewelry.  Do not wear lotions, powders, or cologne. You may NOT wear deodorant.  Men may shave face and neck.  Do not bring valuables to the hospital.  [facility] is not [facility]                  for any belongings or valuables.               Contacts, dentures or bridgework may not be worn into surgery.  Leave suitcase in the car. After surgery it may be brought to your room.  For patients admitted to the hospital, discharge time is determined by your                treatment team.           Special Instructions: [facility] - Preparing for Surgery  Before surgery, you can play an important role.  Because skin is not sterile, your skin needs to be as free of germs as possible.  You can reduce the number of germs on you skin by washing with CHG (chlorahexidine gluconate) soap before surgery.  CHG is an antiseptic cleaner which kills germs and bonds with the skin to continue killing germs even after washing.  Please DO NOT use if you have an allergy to CHG or antibacterial soaps.  If your skin becomes reddened/irritated stop using the CHG and inform your nurse when you arrive at Short Stay.  Do not shave (including legs and underarms) for at least 48 hours prior to the first CHG shower.  You  may shave your face.  Please follow these instructions carefully:   1.  Shower with CHG Soap the night before surgery and the                                morning of Surgery.  2.  If you choose to wash your hair, wash your hair first as usual with your       normal shampoo.  3.  After you shampoo, rinse your hair and body thoroughly to remove the                      Shampoo.  4.  Use CHG as you would any other liquid soap.  You can apply chg directly       to the skin and wash gently with scrungie or a clean washcloth.  5.  Apply the CHG Soap to your body ONLY FROM THE NECK DOWN.        Do not use on open wounds or open sores.  Avoid contact with your eyes, ears, mouth and genitals (private parts).  Wash genitals (private parts) with your normal  soap.  6.  Wash thoroughly, paying special attention to the area where your surgery        will be performed.  7.  Thoroughly rinse your body with warm water from the neck down.  8.  DO NOT shower/wash with your normal soap after using and rinsing off       the CHG Soap.  9.  Pat yourself dry with a clean towel.            10.  Wear clean pajamas.            11.  Place clean sheets on your bed the night of your first shower and do not        sleep with pets.  Day of Surgery  Do not apply any lotions/deodorants the morning of surgery.  Please wear clean clothes to the hospital.     Please read over the following fact sheets that you were given: Pain Booklet, Coughing and Deep Breathing, Blood Transfusion Information, MRSA Information and Surgical Site Infection Prevention

## 2014-08-07 LAB — HEMOGLOBIN A1C
Hgb A1c MFr Bld: 5.2 % (ref <5.7)
Mean Plasma Glucose: 103 mg/dL (ref <117)

## 2014-08-07 NOTE — Anesthesia Preprocedure Evaluation (Addendum)
Anesthesia Evaluation  Patient identified by MRN, date of birth, ID band Patient awake    Reviewed: Allergy & Precautions, H&P , NPO status , Patient's Chart, lab work & pertinent test results  Airway        Dental   Pulmonary shortness of breath, sleep apnea and Continuous Positive Airway Pressure Ventilation , former smoker,          Cardiovascular hypertension, Pt. on medications + CAD, + Past MI, + Cardiac Stents (Patent proximal LAD stent, Moderate stenosis left circumflex, severe diffuse RCA stenosis with left to right collaterals on recent cath.), + Peripheral Vascular Disease and +CHF + dysrhythmias Supra Ventricular Tachycardia + Valvular Problems/Murmurs AS  07/2014 TTE: NL LV size with moderate LV hypertrophy. EF 35% with akinetic inferolateral wall, severely hypokinetic inferior and anterolateral wall. Severe AS. Mild to moderate MR. Moderate pulmonary hypertension. Mild AI   Neuro/Psych negative neurological ROS  negative psych ROS   GI/Hepatic Neg liver ROS, GERD-  Medicated,  Endo/Other  Hypothyroidism   Renal/GU negative Renal ROS     Musculoskeletal  (+) Arthritis -,   Abdominal   Peds  Hematology negative hematology ROS (+)   Anesthesia Other Findings   Reproductive/Obstetrics                            Anesthesia Physical Anesthesia Plan  ASA: IV  Anesthesia Plan: General   Post-op Pain Management:    Induction: Intravenous  Airway Management Planned: Oral ETT  Additional Equipment: CVP, TEE, Arterial line, PA Cath and Ultrasound Guidance Line Placement  Intra-op Plan:   Post-operative Plan: Extubation in OR  Informed Consent: I have reviewed the patients History and Physical, chart, labs and discussed the procedure including the risks, benefits and alternatives for the proposed anesthesia with the patient or authorized representative who has indicated his/her  understanding and acceptance.   Dental advisory given  Plan Discussed with: CRNA and Surgeon  Anesthesia Plan Comments:        Anesthesia Quick Evaluation

## 2014-08-09 MED ORDER — CHLORHEXIDINE GLUCONATE 4 % EX LIQD
60.0000 mL | Freq: Once | CUTANEOUS | Status: DC
  Filled 2014-08-09: qty 60

## 2014-08-09 MED ORDER — NOREPINEPHRINE BITARTRATE 1 MG/ML IV SOLN
0.0000 ug/min | INTRAVENOUS | Status: DC
  Filled 2014-08-09: qty 4

## 2014-08-09 MED ORDER — CHLORHEXIDINE GLUCONATE 4 % EX LIQD
30.0000 mL | CUTANEOUS | Status: DC
  Filled 2014-08-09: qty 30

## 2014-08-09 MED ORDER — DOPAMINE-DEXTROSE 3.2-5 MG/ML-% IV SOLN
0.0000 ug/kg/min | INTRAVENOUS | Status: DC
  Filled 2014-08-09: qty 250

## 2014-08-09 MED ORDER — SODIUM CHLORIDE 0.9 % IV SOLN
INTRAVENOUS | Status: DC
  Filled 2014-08-09: qty 2.5

## 2014-08-09 MED ORDER — SODIUM CHLORIDE 0.9 % IV SOLN
1250.0000 mg | INTRAVENOUS | Status: AC
  Administered 2014-08-10: 1250 mg via INTRAVENOUS
  Filled 2014-08-09: qty 1250

## 2014-08-09 MED ORDER — POTASSIUM CHLORIDE 2 MEQ/ML IV SOLN
80.0000 meq | INTRAVENOUS | Status: DC
  Filled 2014-08-09: qty 40

## 2014-08-09 MED ORDER — DEXTROSE 5 % IV SOLN
0.5000 ug/min | INTRAVENOUS | Status: DC
  Filled 2014-08-09: qty 4

## 2014-08-09 MED ORDER — DEXTROSE 5 % IV SOLN
1.5000 g | INTRAVENOUS | Status: AC
  Administered 2014-08-10: 1.5 g via INTRAVENOUS
  Filled 2014-08-09: qty 1.5

## 2014-08-09 MED ORDER — DEXMEDETOMIDINE HCL IN NACL 400 MCG/100ML IV SOLN
0.1000 ug/kg/h | INTRAVENOUS | Status: AC
  Administered 2014-08-10: 0.2 ug/kg/h via INTRAVENOUS
  Filled 2014-08-09 (×2): qty 100

## 2014-08-09 MED ORDER — METOPROLOL TARTRATE 12.5 MG HALF TABLET
12.5000 mg | ORAL_TABLET | Freq: Once | ORAL | Status: AC
  Administered 2014-08-10: 12.5 mg via ORAL

## 2014-08-09 MED ORDER — SODIUM CHLORIDE 0.9 % IV SOLN: INTRAVENOUS | Status: DC

## 2014-08-09 MED ORDER — SODIUM CHLORIDE 0.9 % IV SOLN
INTRAVENOUS | Status: DC
  Filled 2014-08-09: qty 30

## 2014-08-09 MED ORDER — PHENYLEPHRINE HCL 10 MG/ML IJ SOLN
30.0000 ug/min | INTRAVENOUS | Status: AC
  Administered 2014-08-10: 25 ug/min via INTRAVENOUS
  Filled 2014-08-09: qty 2

## 2014-08-09 MED ORDER — MAGNESIUM SULFATE 50 % IJ SOLN
40.0000 meq | INTRAMUSCULAR | Status: DC
  Filled 2014-08-09: qty 10

## 2014-08-09 MED ORDER — NITROGLYCERIN IN D5W 200-5 MCG/ML-% IV SOLN
2.0000 ug/min | INTRAVENOUS | Status: DC
  Filled 2014-08-09: qty 250

## 2014-08-10 ENCOUNTER — Encounter (HOSPITAL_COMMUNITY): Payer: Self-pay | Admitting: Thoracic Surgery (Cardiothoracic Vascular Surgery)

## 2014-08-10 ENCOUNTER — Inpatient Hospital Stay (HOSPITAL_COMMUNITY): Payer: Medicare Other | Admitting: Anesthesiology

## 2014-08-10 ENCOUNTER — Inpatient Hospital Stay (HOSPITAL_COMMUNITY): Payer: Medicare Other

## 2014-08-10 ENCOUNTER — Inpatient Hospital Stay (HOSPITAL_COMMUNITY)
Admission: RE | Admit: 2014-08-10 | Discharge: 2014-08-14 | DRG: 266 | Disposition: A | Discharge status: Skilled Nursing Facility | Payer: Medicare Other | Source: Ambulatory Visit | Attending: Cardiovascular Disease | Admitting: Cardiovascular Disease

## 2014-08-10 ENCOUNTER — Encounter (HOSPITAL_COMMUNITY)
Admission: RE | Disposition: A | Discharge status: Skilled Nursing Facility | Payer: Medicare Other | Source: Ambulatory Visit | Attending: Cardiovascular Disease

## 2014-08-10 DIAGNOSIS — R011 Cardiac murmur, unspecified: Secondary | ICD-10-CM | POA: Diagnosis present

## 2014-08-10 DIAGNOSIS — Z87891 Personal history of nicotine dependence: Secondary | ICD-10-CM | POA: Diagnosis not present

## 2014-08-10 DIAGNOSIS — I10 Essential (primary) hypertension: Secondary | ICD-10-CM | POA: Diagnosis present

## 2014-08-10 DIAGNOSIS — M1990 Unspecified osteoarthritis, unspecified site: Secondary | ICD-10-CM | POA: Diagnosis present

## 2014-08-10 DIAGNOSIS — I739 Peripheral vascular disease, unspecified: Secondary | ICD-10-CM | POA: Diagnosis present

## 2014-08-10 DIAGNOSIS — K219 Gastro-esophageal reflux disease without esophagitis: Secondary | ICD-10-CM | POA: Diagnosis present

## 2014-08-10 DIAGNOSIS — H409 Unspecified glaucoma: Secondary | ICD-10-CM | POA: Diagnosis present

## 2014-08-10 DIAGNOSIS — Z91018 Allergy to other foods: Secondary | ICD-10-CM

## 2014-08-10 DIAGNOSIS — Z8041 Family history of malignant neoplasm of ovary: Secondary | ICD-10-CM | POA: Diagnosis not present

## 2014-08-10 DIAGNOSIS — R001 Bradycardia, unspecified: Secondary | ICD-10-CM | POA: Diagnosis present

## 2014-08-10 DIAGNOSIS — I480 Paroxysmal atrial fibrillation: Secondary | ICD-10-CM | POA: Diagnosis present

## 2014-08-10 DIAGNOSIS — J9811 Atelectasis: Secondary | ICD-10-CM

## 2014-08-10 DIAGNOSIS — Z6831 Body mass index (BMI) 31.0-31.9, adult: Secondary | ICD-10-CM | POA: Diagnosis not present

## 2014-08-10 DIAGNOSIS — I252 Old myocardial infarction: Secondary | ICD-10-CM | POA: Diagnosis not present

## 2014-08-10 DIAGNOSIS — Z952 Presence of prosthetic heart valve: Secondary | ICD-10-CM

## 2014-08-10 DIAGNOSIS — G4733 Obstructive sleep apnea (adult) (pediatric): Secondary | ICD-10-CM | POA: Diagnosis present

## 2014-08-10 DIAGNOSIS — I5043 Acute on chronic combined systolic (congestive) and diastolic (congestive) heart failure: Secondary | ICD-10-CM | POA: Diagnosis present

## 2014-08-10 DIAGNOSIS — Z9849 Cataract extraction status, unspecified eye: Secondary | ICD-10-CM

## 2014-08-10 DIAGNOSIS — Z006 Encounter for examination for normal comparison and control in clinical research program: Secondary | ICD-10-CM

## 2014-08-10 DIAGNOSIS — E039 Hypothyroidism, unspecified: Secondary | ICD-10-CM | POA: Diagnosis present

## 2014-08-10 DIAGNOSIS — I440 Atrioventricular block, first degree: Secondary | ICD-10-CM | POA: Diagnosis present

## 2014-08-10 DIAGNOSIS — E669 Obesity, unspecified: Secondary | ICD-10-CM | POA: Diagnosis present

## 2014-08-10 DIAGNOSIS — Z880 Allergy status to penicillin: Secondary | ICD-10-CM | POA: Diagnosis not present

## 2014-08-10 DIAGNOSIS — I2510 Atherosclerotic heart disease of native coronary artery without angina pectoris: Secondary | ICD-10-CM | POA: Diagnosis present

## 2014-08-10 DIAGNOSIS — I5042 Chronic combined systolic (congestive) and diastolic (congestive) heart failure: Secondary | ICD-10-CM | POA: Diagnosis present

## 2014-08-10 DIAGNOSIS — D696 Thrombocytopenia, unspecified: Secondary | ICD-10-CM | POA: Diagnosis not present

## 2014-08-10 DIAGNOSIS — K5900 Constipation, unspecified: Secondary | ICD-10-CM | POA: Diagnosis present

## 2014-08-10 DIAGNOSIS — E785 Hyperlipidemia, unspecified: Secondary | ICD-10-CM | POA: Diagnosis present

## 2014-08-10 DIAGNOSIS — I351 Nonrheumatic aortic (valve) insufficiency: Secondary | ICD-10-CM | POA: Diagnosis present

## 2014-08-10 DIAGNOSIS — Z955 Presence of coronary angioplasty implant and graft: Secondary | ICD-10-CM | POA: Diagnosis not present

## 2014-08-10 DIAGNOSIS — Z7982 Long term (current) use of aspirin: Secondary | ICD-10-CM | POA: Diagnosis not present

## 2014-08-10 DIAGNOSIS — I471 Supraventricular tachycardia: Secondary | ICD-10-CM | POA: Diagnosis present

## 2014-08-10 DIAGNOSIS — H3530 Unspecified macular degeneration: Secondary | ICD-10-CM | POA: Diagnosis present

## 2014-08-10 DIAGNOSIS — Z7409 Other reduced mobility: Secondary | ICD-10-CM | POA: Diagnosis present

## 2014-08-10 DIAGNOSIS — I340 Nonrheumatic mitral (valve) insufficiency: Secondary | ICD-10-CM | POA: Diagnosis present

## 2014-08-10 DIAGNOSIS — I255 Ischemic cardiomyopathy: Secondary | ICD-10-CM | POA: Diagnosis present

## 2014-08-10 DIAGNOSIS — I493 Ventricular premature depolarization: Secondary | ICD-10-CM | POA: Diagnosis present

## 2014-08-10 DIAGNOSIS — I350 Nonrheumatic aortic (valve) stenosis: Secondary | ICD-10-CM | POA: Diagnosis present

## 2014-08-10 DIAGNOSIS — D6959 Other secondary thrombocytopenia: Secondary | ICD-10-CM | POA: Diagnosis present

## 2014-08-10 HISTORY — DX: Presence of prosthetic heart valve: Z95.2

## 2014-08-10 HISTORY — DX: Other supraventricular tachycardia: I47.19

## 2014-08-10 HISTORY — PX: TEE WITHOUT CARDIOVERSION: SHX5443

## 2014-08-10 HISTORY — DX: Other reduced mobility: Z74.09

## 2014-08-10 HISTORY — DX: Paroxysmal atrial fibrillation: I48.0

## 2014-08-10 HISTORY — DX: Supraventricular tachycardia: I47.1

## 2014-08-10 HISTORY — PX: TRANSCATHETER AORTIC VALVE REPLACEMENT, TRANSFEMORAL: SHX6400

## 2014-08-10 HISTORY — DX: Chronic combined systolic (congestive) and diastolic (congestive) heart failure: I50.42

## 2014-08-10 LAB — POCT I-STAT, CHEM 8
BUN: 19 mg/dL (ref 6–23)
BUN: 19 mg/dL (ref 6–23)
BUN: 19 mg/dL (ref 6–23)
CALCIUM ION: 1.28 mmol/L (ref 1.13–1.30)
CALCIUM ION: 1.18 mmol/L (ref 1.13–1.30)
CREATININE: 1 mg/dL (ref 0.50–1.35)
Calcium, Ion: 1.3 mmol/L (ref 1.13–1.30)
Chloride: 106 mEq/L (ref 96–112)
Chloride: 105 mEq/L (ref 96–112)
Chloride: 112 mEq/L (ref 96–112)
Creatinine, Ser: 1 mg/dL (ref 0.50–1.35)
Creatinine, Ser: 1 mg/dL (ref 0.50–1.35)
GLUCOSE: 153 mg/dL — AB (ref 70–99)
Glucose, Bld: 101 mg/dL — ABNORMAL HIGH (ref 70–99)
Glucose, Bld: 158 mg/dL — ABNORMAL HIGH (ref 70–99)
HCT: 41 % (ref 39.0–52.0)
HEMATOCRIT: 37 % — AB (ref 39.0–52.0)
HEMATOCRIT: 36 % — AB (ref 39.0–52.0)
HEMOGLOBIN: 13.9 g/dL (ref 13.0–17.0)
HEMOGLOBIN: 12.6 g/dL — AB (ref 13.0–17.0)
HEMOGLOBIN: 12.2 g/dL — AB (ref 13.0–17.0)
Potassium: 3.5 mEq/L — ABNORMAL LOW (ref 3.7–5.3)
Potassium: 3.9 mEq/L (ref 3.7–5.3)
Potassium: 3.7 mEq/L (ref 3.7–5.3)
SODIUM: 141 meq/L (ref 137–147)
Sodium: 136 mEq/L — ABNORMAL LOW (ref 137–147)
Sodium: 133 mEq/L — ABNORMAL LOW (ref 137–147)
TCO2: 25 mmol/L (ref 0–100)
TCO2: 24 mmol/L (ref 0–100)
TCO2: 25 mmol/L (ref 0–100)

## 2014-08-10 LAB — POCT I-STAT 4, (NA,K, GLUC, HGB,HCT)
Glucose, Bld: 140 mg/dL — ABNORMAL HIGH (ref 70–99)
HCT: 37 % — ABNORMAL LOW (ref 39.0–52.0)
HEMOGLOBIN: 12.6 g/dL — AB (ref 13.0–17.0)
Potassium: 4 mEq/L (ref 3.7–5.3)
Sodium: 140 mEq/L (ref 137–147)

## 2014-08-10 LAB — POCT I-STAT 3, ART BLOOD GAS (G3+)
Acid-base deficit: 2 mmol/L (ref 0.0–2.0)
BICARBONATE: 24.3 meq/L — AB (ref 20.0–24.0)
Bicarbonate: 26.3 mEq/L — ABNORMAL HIGH (ref 20.0–24.0)
O2 SAT: 99 %
O2 Saturation: 96 %
PCO2 ART: 48.2 mmHg — AB (ref 35.0–45.0)
PH ART: 7.346 — AB (ref 7.350–7.450)
PO2 ART: 85 mmHg (ref 80.0–100.0)
Patient temperature: 36.8
TCO2: 28 mmol/L (ref 0–100)
TCO2: 26 mmol/L (ref 0–100)
pCO2 arterial: 44.3 mmHg (ref 35.0–45.0)
pH, Arterial: 7.345 — ABNORMAL LOW (ref 7.350–7.450)
pO2, Arterial: 152 mmHg — ABNORMAL HIGH (ref 80.0–100.0)

## 2014-08-10 LAB — GLUCOSE, CAPILLARY
GLUCOSE-CAPILLARY: 102 mg/dL — AB (ref 70–99)
Glucose-Capillary: 110 mg/dL — ABNORMAL HIGH (ref 70–99)
Glucose-Capillary: 100 mg/dL — ABNORMAL HIGH (ref 70–99)
Glucose-Capillary: 99 mg/dL (ref 70–99)

## 2014-08-10 LAB — CBC
HCT: 39.1 % (ref 39.0–52.0)
Hemoglobin: 12.9 g/dL — ABNORMAL LOW (ref 13.0–17.0)
MCH: 30.9 pg (ref 26.0–34.0)
MCHC: 33 g/dL (ref 30.0–36.0)
MCV: 93.8 fL (ref 78.0–100.0)
PLATELETS: 95 10*3/uL — AB (ref 150–400)
RBC: 4.17 MIL/uL — ABNORMAL LOW (ref 4.22–5.81)
RDW: 14.6 % (ref 11.5–15.5)
WBC: 7.7 10*3/uL (ref 4.0–10.5)

## 2014-08-10 LAB — PROTIME-INR
INR: 1.21 (ref 0.00–1.49)
Prothrombin Time: 15.4 seconds — ABNORMAL HIGH (ref 11.6–15.2)

## 2014-08-10 LAB — APTT: aPTT: 32 seconds (ref 24–37)

## 2014-08-10 LAB — PREPARE RBC (CROSSMATCH)

## 2014-08-10 SURGERY — IMPLANTATION, AORTIC VALVE, TRANSCATHETER, FEMORAL APPROACH
Anesthesia: General | Site: Chest

## 2014-08-10 MED ORDER — OXYCODONE HCL 5 MG/5ML PO SOLN: 5.0000 mg | Freq: Once | ORAL | Status: DC | PRN

## 2014-08-10 MED ORDER — MIDAZOLAM HCL 2 MG/2ML IJ SOLN
INTRAMUSCULAR | Status: AC
Start: 2014-08-10 — End: 2014-08-10
  Filled 2014-08-10: qty 2

## 2014-08-10 MED ORDER — METOPROLOL TARTRATE 12.5 MG HALF TABLET
ORAL_TABLET | ORAL | Status: AC
  Filled 2014-08-10: qty 1

## 2014-08-10 MED ORDER — PROPOFOL 10 MG/ML IV BOLUS
INTRAVENOUS | Status: AC
Start: 2014-08-10 — End: 2014-08-10
  Filled 2014-08-10: qty 20

## 2014-08-10 MED ORDER — SODIUM CHLORIDE 0.9 % IR SOLN
Status: DC | PRN
  Administered 2014-08-10 (×2): 500 mL

## 2014-08-10 MED ORDER — GLYCOPYRROLATE 0.2 MG/ML IJ SOLN
INTRAMUSCULAR | Status: DC | PRN
  Administered 2014-08-10: 0.4 mg via INTRAVENOUS

## 2014-08-10 MED ORDER — PHENYLEPHRINE 40 MCG/ML (10ML) SYRINGE FOR IV PUSH (FOR BLOOD PRESSURE SUPPORT)
PREFILLED_SYRINGE | INTRAVENOUS | Status: AC
  Filled 2014-08-10: qty 10

## 2014-08-10 MED ORDER — LACTATED RINGERS IV SOLN
INTRAVENOUS | Status: DC | PRN
Start: 2014-08-10 — End: 2014-08-10
  Administered 2014-08-10: 07:00:00 via INTRAVENOUS

## 2014-08-10 MED ORDER — PANTOPRAZOLE SODIUM 40 MG PO TBEC: 40.0000 mg | DELAYED_RELEASE_TABLET | Freq: Every day | ORAL | Status: DC

## 2014-08-10 MED ORDER — SODIUM CHLORIDE 0.9 % IV SOLN: Freq: Once | INTRAVENOUS | Status: DC

## 2014-08-10 MED ORDER — SODIUM CHLORIDE 0.9 % IJ SOLN
INTRAMUSCULAR | Status: AC
  Filled 2014-08-10: qty 10

## 2014-08-10 MED ORDER — PRAVASTATIN SODIUM 40 MG PO TABS
40.0000 mg | ORAL_TABLET | Freq: Every day | ORAL | Status: DC
  Administered 2014-08-10 – 2014-08-13 (×4): 40 mg via ORAL
  Filled 2014-08-10 (×5): qty 1

## 2014-08-10 MED ORDER — ACETAMINOPHEN 160 MG/5ML PO SOLN
1000.0000 mg | Freq: Four times a day (QID) | ORAL | Status: DC
  Filled 2014-08-10: qty 40.6

## 2014-08-10 MED ORDER — TERAZOSIN HCL 1 MG PO CAPS
1.0000 mg | ORAL_CAPSULE | Freq: Every day | ORAL | Status: DC
  Administered 2014-08-10 – 2014-08-13 (×4): 1 mg via ORAL
  Filled 2014-08-10 (×5): qty 1

## 2014-08-10 MED ORDER — SODIUM CHLORIDE 0.9 % IJ SOLN
3.0000 mL | Freq: Two times a day (BID) | INTRAMUSCULAR | Status: DC
  Administered 2014-08-10: 3 mL via INTRAVENOUS
  Administered 2014-08-12: 10 mL via INTRAVENOUS
  Administered 2014-08-12 – 2014-08-13 (×2): 3 mL via INTRAVENOUS

## 2014-08-10 MED ORDER — ROCURONIUM BROMIDE 100 MG/10ML IV SOLN
INTRAVENOUS | Status: DC | PRN
  Administered 2014-08-10: 30 mg via INTRAVENOUS
  Administered 2014-08-10: 10 mg via INTRAVENOUS

## 2014-08-10 MED ORDER — NEOSTIGMINE METHYLSULFATE 10 MG/10ML IV SOLN
INTRAVENOUS | Status: AC
  Filled 2014-08-10: qty 1

## 2014-08-10 MED ORDER — FENTANYL CITRATE 0.05 MG/ML IJ SOLN: 25.0000 ug | INTRAMUSCULAR | Status: DC | PRN

## 2014-08-10 MED ORDER — METOPROLOL TARTRATE 1 MG/ML IV SOLN
2.5000 mg | INTRAVENOUS | Status: DC | PRN
  Administered 2014-08-10 – 2014-08-12 (×2): 2.5 mg via INTRAVENOUS
  Filled 2014-08-10 (×3): qty 5

## 2014-08-10 MED ORDER — METOPROLOL TARTRATE 12.5 MG HALF TABLET
12.5000 mg | ORAL_TABLET | Freq: Two times a day (BID) | ORAL | Status: DC
  Filled 2014-08-10 (×3): qty 1

## 2014-08-10 MED ORDER — PROTAMINE SULFATE 10 MG/ML IV SOLN
INTRAVENOUS | Status: DC | PRN
  Administered 2014-08-10: 150 mg via INTRAVENOUS

## 2014-08-10 MED ORDER — CLOPIDOGREL BISULFATE 75 MG PO TABS
75.0000 mg | ORAL_TABLET | Freq: Every day | ORAL | Status: DC
  Administered 2014-08-11 – 2014-08-12 (×2): 75 mg via ORAL
  Filled 2014-08-10 (×4): qty 1

## 2014-08-10 MED ORDER — INSULIN REGULAR BOLUS VIA INFUSION
0.0000 [IU] | Freq: Three times a day (TID) | INTRAVENOUS | Status: DC
  Filled 2014-08-10: qty 10

## 2014-08-10 MED ORDER — ONDANSETRON HCL 4 MG/2ML IJ SOLN: 4.0000 mg | Freq: Once | INTRAMUSCULAR | Status: DC | PRN

## 2014-08-10 MED ORDER — FENTANYL CITRATE 0.05 MG/ML IJ SOLN
INTRAMUSCULAR | Status: AC
  Filled 2014-08-10: qty 5

## 2014-08-10 MED ORDER — SODIUM CHLORIDE 0.9 % IJ SOLN: 3.0000 mL | INTRAMUSCULAR | Status: DC | PRN

## 2014-08-10 MED ORDER — OXYCODONE HCL 5 MG PO TABS: 5.0000 mg | ORAL_TABLET | Freq: Once | ORAL | Status: DC | PRN

## 2014-08-10 MED ORDER — OXYBUTYNIN CHLORIDE 5 MG PO TABS
5.0000 mg | ORAL_TABLET | Freq: Two times a day (BID) | ORAL | Status: DC
  Administered 2014-08-10 – 2014-08-14 (×8): 5 mg via ORAL
  Filled 2014-08-10 (×9): qty 1

## 2014-08-10 MED ORDER — SODIUM CHLORIDE 0.9 % IV SOLN: 250.0000 mL | INTRAVENOUS | Status: DC | PRN

## 2014-08-10 MED ORDER — MIDAZOLAM HCL 2 MG/2ML IJ SOLN: 2.0000 mg | INTRAMUSCULAR | Status: DC | PRN

## 2014-08-10 MED ORDER — MORPHINE SULFATE 2 MG/ML IJ SOLN: 1.0000 mg | INTRAMUSCULAR | Status: AC | PRN

## 2014-08-10 MED ORDER — PROPOFOL 10 MG/ML IV BOLUS
INTRAVENOUS | Status: DC | PRN
  Administered 2014-08-10: 40 mg via INTRAVENOUS
  Administered 2014-08-10: 30 mg via INTRAVENOUS

## 2014-08-10 MED ORDER — INSULIN ASPART 100 UNIT/ML ~~LOC~~ SOLN
0.0000 [IU] | SUBCUTANEOUS | Status: DC
  Administered 2014-08-10 – 2014-08-11 (×3): 2 [IU] via SUBCUTANEOUS

## 2014-08-10 MED ORDER — HEPARIN SODIUM (PORCINE) 1000 UNIT/ML IJ SOLN
INTRAMUSCULAR | Status: DC | PRN
  Administered 2014-08-10: 13 mL via INTRAVENOUS

## 2014-08-10 MED ORDER — ACETAMINOPHEN 500 MG PO TABS
1000.0000 mg | ORAL_TABLET | Freq: Four times a day (QID) | ORAL | Status: DC
  Administered 2014-08-11 – 2014-08-14 (×13): 1000 mg via ORAL
  Filled 2014-08-10 (×16): qty 2

## 2014-08-10 MED ORDER — CEFUROXIME SODIUM 1.5 G IJ SOLR
1.5000 g | Freq: Two times a day (BID) | INTRAMUSCULAR | Status: AC
  Administered 2014-08-10 – 2014-08-12 (×4): 1.5 g via INTRAVENOUS
  Filled 2014-08-10 (×4): qty 1.5

## 2014-08-10 MED ORDER — IODIXANOL 320 MG/ML IV SOLN
INTRAVENOUS | Status: DC | PRN
Start: 2014-08-10 — End: 2014-08-10
  Administered 2014-08-10: 71 mL via INTRAVENOUS

## 2014-08-10 MED ORDER — METOPROLOL TARTRATE 25 MG/10 ML ORAL SUSPENSION
12.5000 mg | Freq: Two times a day (BID) | ORAL | Status: DC
  Administered 2014-08-10: 12.5 mg
  Filled 2014-08-10 (×3): qty 5

## 2014-08-10 MED ORDER — TRAMADOL HCL 50 MG PO TABS: 50.0000 mg | ORAL_TABLET | ORAL | Status: DC | PRN

## 2014-08-10 MED ORDER — LACTATED RINGERS IV SOLN
INTRAVENOUS | Status: DC | PRN
  Administered 2014-08-10: 07:00:00 via INTRAVENOUS

## 2014-08-10 MED ORDER — ALBUMIN HUMAN 5 % IV SOLN
INTRAVENOUS | Status: DC | PRN
  Administered 2014-08-10: 07:00:00 via INTRAVENOUS

## 2014-08-10 MED ORDER — FAMOTIDINE IN NACL 20-0.9 MG/50ML-% IV SOLN
20.0000 mg | Freq: Two times a day (BID) | INTRAVENOUS | Status: DC
  Administered 2014-08-10: 20 mg via INTRAVENOUS
  Filled 2014-08-10: qty 50

## 2014-08-10 MED ORDER — NITROGLYCERIN IN D5W 200-5 MCG/ML-% IV SOLN
0.0000 ug/min | INTRAVENOUS | Status: DC
  Administered 2014-08-10: 5 ug/min via INTRAVENOUS

## 2014-08-10 MED ORDER — OXYCODONE HCL 5 MG PO TABS: 5.0000 mg | ORAL_TABLET | ORAL | Status: DC | PRN

## 2014-08-10 MED ORDER — SODIUM CHLORIDE 0.9 % IV SOLN
1.0000 mL/kg/h | INTRAVENOUS | Status: AC
Start: 2014-08-10 — End: 2014-08-10
  Administered 2014-08-10: 1 mL/kg/h via INTRAVENOUS

## 2014-08-10 MED ORDER — MUPIROCIN 2 % EX OINT
1.0000 "application " | TOPICAL_OINTMENT | Freq: Two times a day (BID) | CUTANEOUS | Status: DC
  Administered 2014-08-10 – 2014-08-14 (×8): 1 via NASAL
  Filled 2014-08-10: qty 22

## 2014-08-10 MED ORDER — ONDANSETRON HCL 4 MG/2ML IJ SOLN
INTRAMUSCULAR | Status: AC
  Filled 2014-08-10: qty 2

## 2014-08-10 MED ORDER — EPHEDRINE SULFATE 50 MG/ML IJ SOLN
INTRAMUSCULAR | Status: AC
  Filled 2014-08-10: qty 1

## 2014-08-10 MED ORDER — LEVOTHYROXINE SODIUM 25 MCG PO TABS
25.0000 ug | ORAL_TABLET | Freq: Every day | ORAL | Status: DC
  Administered 2014-08-11 – 2014-08-14 (×4): 25 ug via ORAL
  Filled 2014-08-10 (×5): qty 1

## 2014-08-10 MED ORDER — NEOSTIGMINE METHYLSULFATE 10 MG/10ML IV SOLN
INTRAVENOUS | Status: DC | PRN
Start: 2014-08-10 — End: 2014-08-10
  Administered 2014-08-10: 3 mg via INTRAVENOUS

## 2014-08-10 MED ORDER — FUROSEMIDE 10 MG/ML IJ SOLN
20.0000 mg | Freq: Once | INTRAMUSCULAR | Status: AC
  Administered 2014-08-10: 20 mg via INTRAVENOUS
  Filled 2014-08-10: qty 2

## 2014-08-10 MED ORDER — MORPHINE SULFATE 2 MG/ML IJ SOLN: 2.0000 mg | INTRAMUSCULAR | Status: DC | PRN

## 2014-08-10 MED ORDER — ONDANSETRON HCL 4 MG/2ML IJ SOLN
INTRAMUSCULAR | Status: DC | PRN
  Administered 2014-08-10: 4 mg via INTRAVENOUS

## 2014-08-10 MED ORDER — ACETAMINOPHEN 650 MG RE SUPP: 650.0000 mg | Freq: Once | RECTAL | Status: AC

## 2014-08-10 MED ORDER — INSULIN REGULAR HUMAN 100 UNIT/ML IJ SOLN
INTRAMUSCULAR | Status: DC
  Administered 2014-08-10: 0.8 [IU]/h via INTRAVENOUS
  Filled 2014-08-10 (×2): qty 2.5

## 2014-08-10 MED ORDER — GLYCOPYRROLATE 0.2 MG/ML IJ SOLN
INTRAMUSCULAR | Status: AC
  Filled 2014-08-10: qty 2

## 2014-08-10 MED ORDER — VANCOMYCIN HCL IN DEXTROSE 1-5 GM/200ML-% IV SOLN
1000.0000 mg | Freq: Once | INTRAVENOUS | Status: AC
  Administered 2014-08-10: 1000 mg via INTRAVENOUS
  Filled 2014-08-10: qty 200

## 2014-08-10 MED ORDER — LACTATED RINGERS IV SOLN: 500.0000 mL | Freq: Once | INTRAVENOUS | Status: AC | PRN

## 2014-08-10 MED ORDER — SODIUM CHLORIDE 0.9 % IV SOLN
1000.0000 ug | INTRAVENOUS | Status: DC | PRN
  Administered 2014-08-10: .5 ug/kg/min via INTRAVENOUS

## 2014-08-10 MED ORDER — ACETAMINOPHEN 160 MG/5ML PO SOLN
650.0000 mg | Freq: Once | ORAL | Status: AC
  Administered 2014-08-10: 650 mg

## 2014-08-10 MED ORDER — LIDOCAINE HCL (CARDIAC) 20 MG/ML IV SOLN
INTRAVENOUS | Status: DC | PRN
  Administered 2014-08-10: 70 mg via INTRAVENOUS

## 2014-08-10 MED ORDER — AMIODARONE HCL 200 MG PO TABS
200.0000 mg | ORAL_TABLET | Freq: Every day | ORAL | Status: DC
  Administered 2014-08-11 – 2014-08-14 (×4): 200 mg via ORAL
  Filled 2014-08-10 (×4): qty 1

## 2014-08-10 MED ORDER — ONDANSETRON HCL 4 MG/2ML IJ SOLN: 4.0000 mg | Freq: Four times a day (QID) | INTRAMUSCULAR | Status: DC | PRN

## 2014-08-10 MED ORDER — ALBUMIN HUMAN 5 % IV SOLN: 250.0000 mL | INTRAVENOUS | Status: AC | PRN

## 2014-08-10 MED ORDER — PHENYLEPHRINE HCL 10 MG/ML IJ SOLN
0.0000 ug/min | INTRAMUSCULAR | Status: DC
  Administered 2014-08-10: 20 ug/min via INTRAVENOUS
  Filled 2014-08-10: qty 2

## 2014-08-10 MED ORDER — DEXMEDETOMIDINE HCL IN NACL 200 MCG/50ML IV SOLN: 0.1000 ug/kg/h | INTRAVENOUS | Status: DC

## 2014-08-10 MED ORDER — LIDOCAINE HCL (CARDIAC) 20 MG/ML IV SOLN
INTRAVENOUS | Status: AC
  Filled 2014-08-10: qty 5

## 2014-08-10 MED ORDER — ROCURONIUM BROMIDE 50 MG/5ML IV SOLN
INTRAVENOUS | Status: AC
  Filled 2014-08-10: qty 1

## 2014-08-10 MED ORDER — ASPIRIN EC 81 MG PO TBEC
81.0000 mg | DELAYED_RELEASE_TABLET | Freq: Every day | ORAL | Status: DC
  Administered 2014-08-11 – 2014-08-14 (×4): 81 mg via ORAL
  Filled 2014-08-10 (×4): qty 1

## 2014-08-10 MED FILL — Sodium Chloride IV Soln 0.9%: INTRAVENOUS | Qty: 1000 | Status: AC

## 2014-08-10 MED FILL — Electrolyte-R (PH 7.4) Solution: INTRAVENOUS | Qty: 3000 | Status: AC

## 2014-08-10 SURGICAL SUPPLY — 124 items
ADAPTER CARDIOPLEGIA (MISCELLANEOUS) IMPLANT
ADH SKN CLS APL DERMABOND .7 (GAUZE/BANDAGES/DRESSINGS) ×2
ADH SKN CLS LQ APL DERMABOND (GAUZE/BANDAGES/DRESSINGS) ×2
ANGIOGRAPHIC GUIDEWIRE ×2 IMPLANT
ANTEGRADE CPLG (MISCELLANEOUS) IMPLANT
ATTRACTOMAT 16X20 MAGNETIC DRP (DRAPES) IMPLANT
BAG BANDED W/RUBBER/TAPE 36X54 (MISCELLANEOUS) ×6 IMPLANT
BAG DECANTER FOR FLEXI CONT (MISCELLANEOUS) IMPLANT
BAG EQP BAND 135X91 W/RBR TAPE (MISCELLANEOUS) ×4
BAG SNAP BAND KOVER 36X36 (MISCELLANEOUS) ×4 IMPLANT
BLADE SURG 10 STRL SS (BLADE) ×2 IMPLANT
BLADE SURG ROTATE 9660 (MISCELLANEOUS) IMPLANT
CABLE PACING FASLOC BIEGE (MISCELLANEOUS) ×4 IMPLANT
CANISTER SUCTION 2500CC (MISCELLANEOUS) IMPLANT
CANNULA FEM VENOUS REMOTE 22FR (CANNULA) IMPLANT
CANNULA FEMORAL ART 14 SM (MISCELLANEOUS) IMPLANT
CANNULA GUNDRY RCSP 15FR (MISCELLANEOUS) IMPLANT
CANNULA OPTISITE PERFUSION 16F (CANNULA) IMPLANT
CANNULA OPTISITE PERFUSION 18F (CANNULA) IMPLANT
CANNULA SOFTFLOW AORTIC 7M21FR (CANNULA) IMPLANT
CANNULA VENOUS LOW PROF 34X46 (CANNULA) IMPLANT
CATH DIAG EXPO 6F VENT PIG 145 (CATHETERS) ×4 IMPLANT
CATH EXPO 5FR AL1 (CATHETERS) ×2 IMPLANT
CATH S G BIP PACING (SET/KITS/TRAYS/PACK) ×4 IMPLANT
CATH SOFT-VU 4F 65 STRAIGHT (CATHETERS) IMPLANT
CATH SOFT-VU STRAIGHT 4F 65CM (CATHETERS) ×4
CLIP TI MEDIUM 24 (CLIP) ×4 IMPLANT
CLIP TI WIDE RED SMALL 24 (CLIP) ×4 IMPLANT
CONN ST 1/4X3/8  BEN (MISCELLANEOUS)
CONN ST 1/4X3/8 BEN (MISCELLANEOUS) IMPLANT
CONNECTOR 1/2X3/8X1/2 3 WAY (MISCELLANEOUS)
CONNECTOR 1/2X3/8X1/2 3WAY (MISCELLANEOUS) IMPLANT
COVER DOME SNAP 22 D (MISCELLANEOUS) ×6 IMPLANT
COVER MAYO STAND STRL (DRAPES) ×4 IMPLANT
COVER PROBE W GEL 5X96 (DRAPES) IMPLANT
COVER SURGICAL LIGHT HANDLE (MISCELLANEOUS) ×4 IMPLANT
COVER TABLE BACK 60X90 (DRAPES) ×4 IMPLANT
CRADLE DONUT ADULT HEAD (MISCELLANEOUS) ×4 IMPLANT
DERMABOND ADHESIVE PROPEN (GAUZE/BANDAGES/DRESSINGS) ×2
DERMABOND ADVANCED (GAUZE/BANDAGES/DRESSINGS) ×2
DERMABOND ADVANCED .7 DNX12 (GAUZE/BANDAGES/DRESSINGS) ×2 IMPLANT
DERMABOND ADVANCED .7 DNX6 (GAUZE/BANDAGES/DRESSINGS) IMPLANT
DRAIN CHANNEL 28F RND 3/8 FF (WOUND CARE) IMPLANT
DRAIN CHANNEL 32F RND 10.7 FF (WOUND CARE) IMPLANT
DRAPE INCISE IOBAN 66X45 STRL (DRAPES) IMPLANT
DRAPE SLUSH/WARMER DISC (DRAPES) ×4 IMPLANT
DRAPE TABLE COVER HEAVY DUTY (DRAPES) ×4 IMPLANT
DRSG TEGADERM 4X4.75 (GAUZE/BANDAGES/DRESSINGS) ×4 IMPLANT
ELECT REM PT RETURN 9FT ADLT (ELECTROSURGICAL) ×8
ELECTRODE REM PT RTRN 9FT ADLT (ELECTROSURGICAL) ×4 IMPLANT
FELT TEFLON 6X6 (MISCELLANEOUS) ×4 IMPLANT
FEMORAL VENOUS CANN RAP (CANNULA) IMPLANT
GAUZE SPONGE 4X4 12PLY STRL (GAUZE/BANDAGES/DRESSINGS) ×4 IMPLANT
GAUZE SPONGE 4X4 16PLY XRAY LF (GAUZE/BANDAGES/DRESSINGS) ×2 IMPLANT
GLOVE BIOGEL PI IND STRL 6 (GLOVE) IMPLANT
GLOVE BIOGEL PI IND STRL 6.5 (GLOVE) IMPLANT
GLOVE BIOGEL PI INDICATOR 6 (GLOVE) ×2
GLOVE BIOGEL PI INDICATOR 6.5 (GLOVE) ×8
GLOVE ECLIPSE 7.5 STRL STRAW (GLOVE) ×4 IMPLANT
GLOVE EUDERMIC 7 POWDERFREE (GLOVE) ×4 IMPLANT
GLOVE ORTHO TXT STRL SZ7.5 (GLOVE) ×4 IMPLANT
GOWN STRL REUS W/ TWL LRG LVL3 (GOWN DISPOSABLE) ×6 IMPLANT
GOWN STRL REUS W/ TWL XL LVL3 (GOWN DISPOSABLE) ×12 IMPLANT
GOWN STRL REUS W/TWL LRG LVL3 (GOWN DISPOSABLE) ×20
GOWN STRL REUS W/TWL XL LVL3 (GOWN DISPOSABLE) ×24
GUIDEWIRE AMPLATZ STIFF 0.35 (WIRE) ×2 IMPLANT
GUIDEWIRE SAF TJ AMPL .035X180 (WIRE) ×4 IMPLANT
GUIDEWIRE SAFE TJ AMPLATZ EXST (WIRE) ×2 IMPLANT
GUIDEWIRE STRAIGHT .035 260CM (WIRE) ×2 IMPLANT
HEMOSTAT POWDER SURGIFOAM 1G (HEMOSTASIS) IMPLANT
INSERT FOGARTY 61MM (MISCELLANEOUS) ×2 IMPLANT
INSERT FOGARTY SM (MISCELLANEOUS) ×4 IMPLANT
KIT BASIN OR (CUSTOM PROCEDURE TRAY) ×2 IMPLANT
KIT DILATOR VASC 18G NDL (KITS) IMPLANT
KIT HEART LEFT (KITS) ×2 IMPLANT
KIT ROOM TURNOVER OR (KITS) ×4 IMPLANT
KIT SUCTION CATH 14FR (SUCTIONS) ×8 IMPLANT
LEAD PACING MYOCARDI (MISCELLANEOUS) IMPLANT
NDL PERC 18GX7CM (NEEDLE) ×2 IMPLANT
NEEDLE PERC 18GX7CM (NEEDLE) ×4 IMPLANT
NS IRRIG 1000ML POUR BTL (IV SOLUTION) ×12 IMPLANT
PACK AORTA (CUSTOM PROCEDURE TRAY) ×4 IMPLANT
PAD ARMBOARD 7.5X6 YLW CONV (MISCELLANEOUS) ×8 IMPLANT
PAD ELECT DEFIB RADIOL ZOLL (MISCELLANEOUS) ×4 IMPLANT
SET ASEPTIC TRANSFER (MISCELLANEOUS) ×2 IMPLANT
SET CANNULATION TOURNIQUET (MISCELLANEOUS) IMPLANT
SHEATH PINNACLE 6F 10CM (SHEATH) ×4 IMPLANT
SPONGE LAP 4X18 X RAY DECT (DISPOSABLE) ×4 IMPLANT
STOPCOCK MORSE 400PSI 3WAY (MISCELLANEOUS) ×6 IMPLANT
SUT ETHIBOND 2 0 SH (SUTURE)
SUT ETHIBOND 2 0 SH 36X2 (SUTURE) ×2 IMPLANT
SUT ETHIBOND X763 2 0 SH 1 (SUTURE) ×6 IMPLANT
SUT GORETEX CV 4 TH 22 36 (SUTURE) ×2 IMPLANT
SUT GORETEX CV4 TH-18 (SUTURE) ×4 IMPLANT
SUT MNCRL AB 3-0 PS2 18 (SUTURE) ×4 IMPLANT
SUT PDS AB 1 CTX 36 (SUTURE) IMPLANT
SUT PROLENE 2 0 MH 48 (SUTURE) IMPLANT
SUT PROLENE 3 0 SH1 36 (SUTURE) IMPLANT
SUT PROLENE 4 0 RB 1 (SUTURE) ×4
SUT PROLENE 4-0 RB1 .5 CRCL 36 (SUTURE) IMPLANT
SUT PROLENE 5 0 C 1 36 (SUTURE) ×10 IMPLANT
SUT PROLENE 6 0 C 1 30 (SUTURE) ×10 IMPLANT
SUT SILK  1 MH (SUTURE) ×2
SUT SILK 1 MH (SUTURE) ×2 IMPLANT
SUT SILK 2 0 SH CR/8 (SUTURE) ×4 IMPLANT
SUT TEM PAC WIRE 2 0 SH (SUTURE) IMPLANT
SUT VIC AB 2-0 CTX 36 (SUTURE) IMPLANT
SUT VIC AB 3-0 SH 8-18 (SUTURE) ×8 IMPLANT
SYR 30ML LL (SYRINGE) ×8 IMPLANT
SYR 50ML LL SCALE MARK (SYRINGE) ×4 IMPLANT
SYR 5ML LL (SYRINGE) ×2 IMPLANT
SYR MEDRAD MARK V 150ML (SYRINGE) ×2 IMPLANT
TAPE CLOTH SURG 4X10 WHT LF (GAUZE/BANDAGES/DRESSINGS) ×2 IMPLANT
TOWEL OR 17X24 6PK STRL BLUE (TOWEL DISPOSABLE) ×12 IMPLANT
TOWEL OR 17X26 10 PK STRL BLUE (TOWEL DISPOSABLE) ×8 IMPLANT
TRANSDUCER W/STOPCOCK (MISCELLANEOUS) ×4 IMPLANT
TRAY FOLEY IC TEMP SENS 14FR (CATHETERS) ×4 IMPLANT
TUBE SUCT INTRACARD DLP 20F (MISCELLANEOUS) IMPLANT
TUBING ART PRESS 72  MALE/FEM (TUBING) ×2
TUBING ART PRESS 72 MALE/FEM (TUBING) IMPLANT
TUBING HIGH PRESSURE 120CM (CONNECTOR) ×4 IMPLANT
VALVE HRT TRANSCATH NOVAFL 29M (Valve) ×2 IMPLANT
WATER STERILE IRR 1000ML POUR (IV SOLUTION) ×8 IMPLANT
WIRE LUNDERQUIST .035X180CM (WIRE) ×2 IMPLANT

## 2014-08-10 NOTE — H&P (Addendum)
[address]       [name] [address]             [phone]          CARDIOTHORACIC SURGERY HISTORY AND PHYSICAL EXAM  PCP is Earl Lagos, MD  Primary Cardiologist is Tonny Bollman, MD    Chief Complaint   Patient presents with   .  Aortic Stenosis     HPI:  Patient returns for follow up of severe aortic stenosis.  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 The patient's cardiac history dates back to 2 when he suffered an acute myocardial infarction. He was treated with primary angioplasty.  The patient has had known history of aortic stenosis with previous echocardiogram from 2010 demonstrating mild aortic stenosis  (AVA estimated 1.64cm2, mean gradient 16 mmHg) and ejection fraction 50-55%. He also had episodes of supraventricular tachycardia which were treated medically.  On October 31, 2011 the patient suffered a sudden syncopal episode while he was carrying in groceries from the car to his home. He fell and hit his head and was unconscious for at least 20 minutes. A friend was present and summoned EMS. The patient was brought directly to the emergency room where he arrived in sinus rhythm.  He ruled out for acute myocardial infarction. Echocardiogram demonstrated severe aortic stenosis with moderate to severe left ventricular dysfunction. Left heart catheterization was performed demonstrating severe three-vessel coronary artery disease. I had the opportunity to evaluate the patient during that hospitalization in conjunction with Dr Excell Seltzer, and we discussed possible high-risk aortic valve replacement and coronary artery bypass grafting versus less invasive and palliative means of treating his aortic stenosis and coronary artery disease at that time.  The patient had a somewhat complicated hospital course for a variety of  reasons, and mental status waxed and waned a bit. A decision was made to treat him with primary balloon valvuloplasty and PCI with bare-metal stent to the high-grade proximal stenosis of the left anterior descending coronary artery.  The patient did remarkably well after these procedures, although he did suffer a lower GI bleed felt likely related to diverticular disease.  He ultimately recovered and has been followed ever since by Dr. Excell Seltzer.  Dual antiplatelet therapy for his stent in the left anterior descending coronary artery was discontinued because of GI bleeding. The patient is also developed atrial tachycardia for which he is treated with amiodarone. Last year he developed a nonhealing ulcer on the left great toe for which he underwent angioplasty of the left anterior tibial artery by Dr. Edilia Bo.  In July the patient was hospitalized briefly with acute exacerbation of chronic diastolic congestive heart failure, and in August he was seen briefly in the emergency department for orthostatic dizzy spells without syncope. He has had several mechanical falls, although these have primarily been related to poor balance without any history of syncope.  Last month the patient was seen in followup by Dr. Excell Seltzer. At that time the patient was noted to have progressive symptoms of exertional shortness of breath.  Followup transthoracic echocardiogram demonstrated further progression the severity of aortic stenosis with peak and mean transvalvular gradients across the aortic valve measured 76 and 60 mm mercury, respectively.  The patient has been referred back for repeat surgical consultation to discuss treatment options for the management of severe, symptomatic aortic  stenosis.  The patient [place] locally and [place] with a gentleman who stays with him nearly 24 hours a day and assists with the patient's physical needs.  The patient has an excellent support system with a preacher who serves as the patient's medical  power of attorney in several family members who also check on him on a regular basis. He is accompanied to the office today by his preacher (medical POA), his roommate, his nephew, and his nephew's wife.  He lives a very sedentary lifestyle. He is able to walk but his gait is unsteady. He uses a cane or walker to ambulate short distances around the house, but for anything beyond that the patient is essentially down to a wheelchair. However, he is able to get up to the bathroom on his own with minimal assistance and overall the patient states that his quality of life is fairly good. He has developed progressive shortness of breath to the point where he now gets short of breath with minimal activity. The patient has not had resting shortness of breath since he was hospitalized in early July. He denies any history of chest pain or chest tightness either with activity or at rest. He has not had any recent dizzy spells, or syncope. He has chronic bilateral lower extremity edema which has been under better control over the past 2 months.      Past Medical History  Diagnosis Date  . Glaucoma     Right eye  . Diverticulosis     multiple hospital admissions for GI bleed, has an episode roughly every 6 months,, last  colonscopy in 2006 showed diverticulosis, was evaluated by Dr. Madilyn FiremanHayes in the hospital in 2011 for lower GI bleed andd it  was suggested that the patient was not actively bleeding at that time and given his comorbid conditions colonoscopy was deferred. He is also chronically constipated  . Macular degeneration of right eye   . Allergic rhinitis   . CAD (coronary artery disease)     s/p PCI in 1991;  Cardiac catheterization 11/02/11: Ostial LAD 95% with aneurysmal dilatation after this, proximal circumflex 70% with a superior branch 30-40%, proximal RCA 75% and 90%, mid RCA 50% and 90% before a PDA, then occluded.;    high risk for CABG;PCI 11/14/11: Bare-metal stent to the proximal LAD    .  Hypertension   . Hyperlipidemia   . Chronic systolic heart failure     Echocardiogram 11/02/11: Moderate LVH, EF 30-35%, multiple wall motion abnormalities, critical aortic stenosis, AVA 0.6, mean gradient 45, mild MR, severe LAE  . Supraventricular tachycardia     2 syndromes-nonsustained atrial tachycardia//adenosine responsive diuretic positive reentry probably AV node reentry  . Aortic stenosis     s/p AV balloon valvuloplasty by Dr. [name] 11/2011  . Ischemic cardiomyopathy   . H/O: GI bleed     recurrent  . Heart murmur   . CHF (congestive heart failure)   . Pneumonia 2012  . Hypothyroidism   . GERD (gastroesophageal reflux disease)   . Falls frequently     3 times in the past week/notes 09/11/2013  . Myocardial infarction 1991; 10/28/2011  . Exertional shortness of breath     "not much" (09/11/2013)  . Arthritis     "might have slight; have trouble w/my fingers" (09/11/2013)  . Atrial fibrillation   . Peripheral vascular disease   . OSA on CPAP     since 89    Past Surgical History  Procedure Laterality Date  .  Glaucoma surgery      Implantation of Baerveldt glaucoma device lant with scleral reinforcement using tutoplast tissue graft right eye. [Other]  . Coronary angioplasty with stent placement  Jan. 23, 2013  . Corneal transplant Right   . Angioplasty Left 03/30/2013    tibial  . Tonsillectomy  1946  . Cataract extraction w/ intraocular lens  implant, bilateral Bilateral   . Cardiac valve replacement  11/30/2011    aortic valve.?    Family History  Problem Relation Age of Onset  . Cancer Mother     Ovarian Cancer  . Cancer Father     Lung  . Heart disease Sister     Heart disease before age 63    Social History History  Substance Use Topics  . Smoking status: Former Smoker -- 3.00 packs/day for 21 years    Types: Cigarettes    Quit date: 10/08/1958  . Smokeless tobacco: Never Used  . Alcohol Use: No    Prior to Admission medications   Medication Sig  Start Date End Date Taking? Authorizing Provider  amiodarone (PACERONE) 200 MG tablet Take 200 mg by mouth daily.   Yes Historical Provider, MD  aspirin EC 81 MG tablet Take 81 mg by mouth daily.   Yes Historical Provider, MD  Calcium Carb-Cholecalciferol (CALCIUM 600+D) 600-800 MG-UNIT TABS Take 1 tablet by mouth daily.   Yes Historical Provider, MD  ferrous sulfate 325 (65 FE) MG tablet Take 325 mg by mouth daily with breakfast.   Yes Historical Provider, MD  furosemide (LASIX) 20 MG tablet Take 1 tablet (20 mg total) by mouth every morning. 12/24/13  Yes Rollene Rotunda, MD  levothyroxine (SYNTHROID, LEVOTHROID) 25 MCG tablet Take 25 mcg by mouth daily before breakfast.   Yes Historical Provider, MD  losartan (COZAAR) 50 MG tablet Take 50 mg by mouth daily.   Yes Historical Provider, MD  Multiple Vitamin (MULTIVITAMIN) tablet Take 1 tablet by mouth at bedtime.   Yes Historical Provider, MD  oxybutynin (DITROPAN) 5 MG tablet Take 5 mg by mouth 2 (two) times daily.   Yes Historical Provider, MD  pantoprazole (PROTONIX) 40 MG tablet Take 40 mg by mouth daily.   Yes Historical Provider, MD  pravastatin (PRAVACHOL) 40 MG tablet Take 1 tablet (40 mg total) by mouth daily. 06/17/14  Yes Earl Lagos, MD  spironolactone (ALDACTONE) 25 MG tablet Take 12.5 mg by mouth every other day.    Yes Historical Provider, MD  terazosin (HYTRIN) 1 MG capsule Take 1 mg by mouth at bedtime.   Yes Historical Provider, MD  vitamin B-12 (CYANOCOBALAMIN) 1000 MCG tablet Take 1,000 mcg by mouth daily.   Yes Historical Provider, MD    Allergies  Allergen Reactions  . Penicillins Swelling and Other (See Comments)    REACTION: "turns red"  . Corn-Containing Products Other (See Comments)    Trouble digesting    Review of Systems:              General:                      good appetite, decreased energy, no weight gain, no weight loss, no fever             Cardiac:                      no chest pain with exertion,  no chest pain at rest, + SOB with minimal exertion, no resting SOB,  no PND, no orthopnea, no palpitations, + arrhythmia, + atrial fibrillation, + LE edema, + dizzy spells, no recent syncope             Respiratory:                + shortness of breath, no home oxygen, no productive cough, + intermittent dry cough, no bronchitis, no wheezing, no hemoptysis, no asthma, no pain with inspiration or cough, + sleep apnea, + CPAP at night             GI:                                no difficulty swallowing, no reflux, no frequent heartburn, no hiatal hernia, no abdominal pain, no constipation, no diarrhea, no hematochezia, no hematemesis, no melena             GU:                              no dysuria,  + frequency, no urinary tract infection, no hematuria, no enlarged prostate, no kidney stones, + kidney disease             Vascular:                     no pain suggestive of claudication, no pain in feet, + occasional leg cramps, no varicose veins, no DVT, + non-healing foot ulcer             Neuro:                         no stroke, no TIA's, no seizures, no headaches, no temporary blindness one eye,  no slurred speech, + peripheral neuropathy, no chronic pain, + instability of gait, no memory/cognitive dysfunction             Musculoskeletal:         mild arthritis, no joint swelling, no myalgias, + difficulty walking, very limited mobility               Skin:                            no rash, no itching, no skin infections, no pressure sores or ulcerations             Psych:                         no anxiety, no depression, no nervousness, no unusual recent stress             Eyes:                           no blurry vision, no floaters, no recent vision changes, + wears glasses or contacts             ENT:                            + hearing loss, no loose or painful teeth, + dentures             Hematologic:               +  easy bruising, no abnormal bleeding, no clotting disorder, no frequent  epistaxis             Endocrine:                   no diabetes, does not check CBG's at home                                                       Physical Exam:              BP 136/86  Pulse 63  Ht 5\' 7"  (1.702 m)  Wt 207 lb (93.895 kg)  BMI 32.41 kg/m2  SpO2 94%             General:                      Obese, elderly and frail-appearing             HEENT:                       Unremarkable               Neck:                           no JVD, no bruits, no adenopathy               Chest:                         Fairly clear to auscultation with exception of crackles R lung base, symmetrical breath sounds, no wheezes, no rhonchi               CV:                              RRR, grade IV/VI crescendo/decrescendo murmur heard best at RSB,  no diastolic murmur             Abdomen:                    soft, non-tender, no masses               Extremities:                 warm, well-perfused, pulses not palpable, mild bilateral LE edema             Rectal/GU                   Deferred             Neuro:                         Grossly non-focal and symmetrical throughout             Skin:                            Clean and dry, no rashes, no breakdown   Diagnostic Tests:  Transthoracic Echocardiography  Patient: Eugene, [name] MR #: [id number] Study Date: 06/18/2014 Gender: M Age: 49 Height: 170.2  cm Weight: 97.5 kg BSA: 2.18 m^2 Pt. Status: Room:  ORDERING Tonny Bollman REFERRING Tonny Bollman ATTENDING Marca Ancona, M.D. SONOGRAPHER Aida Raider, RDCS PERFORMING [facility], Outpatient  cc:  ------------------------------------------------------------------- LV EF: 35%  ------------------------------------------------------------------- Indications: 424.1 Aortic valve disorders.  ------------------------------------------------------------------- History: PMH: Acquired from the patient and from the patient&'s chart. PMH: Peripheral Vascular Disease.  Paroxysmal Atrial Fibrillation. Aortic Stenosis. CAD. Chronic Venous Insufficiency. Obstructive Sleep Apnea-CPAP. Bradycardia. Supraventricular Tachycardia. Murmur. Ischemic Cardiomyopathy. MI. Risk factors: Hypertension. Obese. Dyslipidemia.  ------------------------------------------------------------------- Study Conclusions  - Left ventricle: The cavity size was normal. Wall thickness was increased in a pattern of moderate LVH. Akinetic inferolateral wall, severely hypokinetic inferior wall and anterolateral wall. The estimated ejection fraction was 35%. Doppler parameters are consistent with abnormal left ventricular relaxation (grade 1 diastolic dysfunction). - Aortic valve: Trileaflet; severely calcified leaflets. There was severe stenosis. There was trivial regurgitation. Mean gradient (S): 60 mm Hg. Peak gradient (S): 76 mm Hg. Valve area (VTI): 0.67 cm^2. - Mitral valve: Moderately calcified annulus. Mildly calcified leaflets . There was mild to moderate regurgitation. - Left atrium: The atrium was moderately dilated. - Right ventricle: The cavity size was normal. Systolic function was normal. - Tricuspid valve: Peak RV-RA gradient 46 mmHg. - Systemic veins: IVC not visualized. - Pericardium, extracardiac: A trivial pericardial effusion was identified.  Impressions:  - Normal LV size with moderate LV hypertrophy. EF 35% with wall motion abnormalities noted above. Severe aortic stenosis. Mild to moderate MR. Normal RV size and systolic function. Moderate pulmonary hypertension.  ------------------------------------------------------------------- Labs, prior tests, procedures, and surgery: Status post Aortic Valve Balloon Valvuloplasty. Status post PCI-Bare Metal Stent to Proximal LAD. Transthoracic echocardiography. M-mode, complete 2D, spectral Doppler, and color Doppler. Birthdate: Patient birthdate: 1924/03/03. Age: Patient is 80 yr old. Sex: Gender: male. BMI:  33.7 kg/m^2. Blood pressure: 116/78 Patient status: Outpatient. Study date: Study date: 06/18/2014. Study time: 02:16 PM. Location:  Site 3  -------------------------------------------------------------------  ------------------------------------------------------------------- Left ventricle: The cavity size was normal. Wall thickness was increased in a pattern of moderate LVH. Akinetic inferolateral wall, severely hypokinetic inferior wall and anterolateral wall. The estimated ejection fraction was 35%. Doppler parameters are consistent with abnormal left ventricular relaxation (grade 1 diastolic dysfunction).  ------------------------------------------------------------------- Aortic valve: Trileaflet; severely calcified leaflets. Doppler: There was severe stenosis. There was trivial regurgitation. VTI ratio of LVOT to aortic valve: 0.2. Valve area (VTI): 0.67 cm^2. Indexed valve area (VTI): 0.31 cm^2/m^2. Peak velocity ratio of LVOT to aortic valve: 0.16. Valve area (Vmax): 0.6 cm^2. Indexed valve area (Vmax): 0.27 cm^2/m^2. Mean velocity ratio of LVOT to aortic valve: 0.13. Valve area (Vmean): 0.51 cm^2. Indexed valve area (Vmean): 0.23 cm^2/m^2. Mean gradient (S): 60 mm Hg. Peak gradient (S): 76 mm Hg.  ------------------------------------------------------------------- Aorta: Aortic root: The aortic root was normal in size. Ascending aorta: The ascending aorta was normal in size.  ------------------------------------------------------------------- Mitral valve: Moderately calcified annulus. Mildly calcified leaflets . Doppler: There was no evidence for stenosis. There was mild to moderate regurgitation. Peak gradient (D): 3 mm Hg.  ------------------------------------------------------------------- Left atrium: The atrium was moderately dilated.  ------------------------------------------------------------------- Right ventricle: The cavity size was normal.  Systolic function was normal.  ------------------------------------------------------------------- Pulmonic valve: Structurally normal valve. Cusp separation was normal. Doppler: Transvalvular velocity was within the normal range. There was trivial regurgitation.  ------------------------------------------------------------------- Tricuspid valve: Doppler: There was trivial regurgitation. Peak RV-RA gradient 46 mmHg.  ------------------------------------------------------------------- Right atrium: The atrium was normal in size.  ------------------------------------------------------------------- Pericardium: A trivial pericardial effusion was identified.  -------------------------------------------------------------------  Systemic veins: IVC not visualized.  ------------------------------------------------------------------- Measurements  Left ventricle Value Reference LV ID, ED, PLAX chordal (H) 56 mm 43 - 52 LV ID, ES, PLAX chordal (H) 47 mm 23 - 38 LV fx shortening, PLAX chordal (L) 16 % >=29 LV PW thickness, ED 14 mm --------- IVS/LV PW ratio, ED 1.21 <=1.3 Stroke volume, 2D 71 ml --------- Stroke volume/bsa, 2D 33 ml/m^2 --------- LV e&', lateral 5.87 cm/s --------- LV E/e&', lateral 13.51 --------- LV e&', medial 4.35 cm/s --------- LV E/e&', medial 18.23 --------- LV e&', average 5.11 cm/s --------- LV E/e&', average 15.52 ---------  Ventricular septum Value Reference IVS thickness, ED 17 mm ---------  LVOT Value Reference LVOT ID, S 22 mm --------- LVOT area 3.8 cm^2 --------- LVOT peak velocity, S 70.5 cm/s --------- LVOT mean velocity, S 50.2 cm/s --------- LVOT VTI, S 21 cm ---------  Aortic valve Value Reference Aortic valve peak velocity, S 437 cm/s --------- Aortic valve mean velocity, S 374 cm/s --------- Aortic valve VTI, S 103 cm --------- Aortic mean gradient, S 60 mm Hg --------- Aortic peak gradient, S 76 mm Hg --------- VTI ratio, LVOT/AV  0.2 --------- Aortic valve area, VTI 0.67 cm^2 --------- Aortic valve area/bsa, VTI 0.31 cm^2/m^2 --------- Velocity ratio, peak, LVOT/AV 0.16 --------- Aortic valve area, peak velocity 0.6 cm^2 --------- Aortic valve area/bsa, peak 0.27 cm^2/m^2 --------- velocity Velocity ratio, mean, LVOT/AV 0.13 --------- Aortic valve area, mean velocity 0.51 cm^2 --------- Aortic valve area/bsa, mean 0.23 cm^2/m^2 --------- velocity Aortic regurg pressure half-time 1084 ms ---------  Aorta Value Reference Aortic root ID, ED 33 mm ---------  Left atrium Value Reference LA ID, A-P, ES 54 mm --------- LA ID/bsa, A-P (H) 2.47 cm/m^2 <=2.2  Mitral valve Value Reference Mitral E-wave peak velocity 79.3 cm/s --------- Mitral A-wave peak velocity 111 cm/s --------- Mitral deceleration time 183 ms 150 - 230 Mitral peak gradient, D 3 mm Hg --------- Mitral E/A ratio, peak 0.7 --------- Mitral maximal regurg velocity, 706 cm/s --------- PISA  Right ventricle Value Reference RV s&', lateral, S 13.6 cm/s ---------  Legend: (L) and (H) mark values outside specified reference range.  ------------------------------------------------------------------- Prepared and Electronically Authenticated by  Marca Ancona, M.D. 2015-09-11T18:25:14      Cardiac Catheterization Procedure Note  Name: Eugene [name] MRN: [id number] DOB: 1924/02/10  Procedure: Right Heart Cath, Selective Coronary Angiography, aortic root angiography  Indication: Severe aortic stenosis  Procedural Details: The right groin was prepped, draped, and anesthetized with 1% lidocaine. A 5 French sheath was inserted into the right femoral artery via a from wall puncture and a 7 French sheath was inserted into the right femoral vein via a from wall puncture. A Swan-Ganz catheter was used for the right heart catheterization. Standard protocol was followed for recording of right heart pressures and sampling of oxygen saturations.  Fick cardiac output was calculated. Standard Judkins catheters were used for selective coronary angiography. A JL 5 catheter had to be used for the left coronary artery. Aortic root angiography was performed using a pigtail catheter. There were no immediate procedural complications. The patient was transferred to the post catheterization recovery area for further monitoring.  Procedural Findings: Hemodynamics RA 5 RV 36/7 PA 42/20 with a mean of 29 PCWP 17 LV not recorded AO 160/77 with a mean of 109  Oxygen saturations: PA 58 AO 92  Cardiac Output (Fick) 3.6  Cardiac Index (Fick) 1.8  Coronary angiography: Coronary dominance: right  Left mainstem: The left mainstem arises from the left coronary  cusp. The vessel is calcified but widely patent.  Left anterior descending (LAD): The LAD is patent. The stented segment in the proximal LAD is widely patent without stenosis. The mid and distal LAD have mild diffuse irregularity without high-grade stenosis. The apical LAD supplies a well formed collateral to the right posterolateral branch.  Left circumflex (LCx): The left circumflex is medium in caliber. The mid vessel has 60% stenosis before the first obtuse marginal. There is no critical stenosis in the left circumflex distribution.  Right coronary artery (RCA): The RCA is severely diseased. The vessel is severely calcified. There are 90% stenoses in the proximal, mid, and distal vessel. The PDA branch is patent. The PLA is occluded and fills from left to right collaterals.  Left ventriculography: Not performed  Aortic root angiography: The aortic valve is severely calcified. There is mild aortic insufficiency.  Estimated Blood Loss: Minimal  Final Conclusions:  1. Diffuse coronary artery disease with continued patency of the stented segment in the proximal LAD, moderate stenosis of the left circumflex, and severe diffuse stenosis of the right coronary artery with  left-to-right collaterals identified 2. Essentially normal right heart intracardiac filling pressures 3. Severely calcified aortic valve with no known severe aortic stenosis and angiographic evidence of 1+ aortic insufficiency  Recommendations: Medical therapy for CAD. 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 Continue evaluation for TAVR.  Tonny Bollman MD, [facility] 07/16/2014, 12:57 PM     CT ANGIOGRAPHY OF THE HEART, CORONARY ARTERY, STRUCTURE, AND MORPHOLOGY  COMPARISON: No priors.  CONTRAST: 80mL OMNIPAQUE IOHEXOL 350 MG/ML SOLN  TECHNIQUE: CT angiography of the coronary vessels was performed on a 256 channel system using prospective ECG gating. A scout and ECG- gated noncontrast exam (for calcium scoring) were performed. Appropriate delay was determined by bolus tracking after injection of iodinated contrast, and an ECG-gated coronary CTA was performed with sub-mm slice collimation throughout the cardiac cycle. Imaging post processing was performed on an independent workstation creating multiplanar and 3-D images, allowing for quantitative analysis of the heart and coronary arteries. Note that this exam targets the heart and the chest was not imaged in its entirety.  PREMEDICATION: None  FINDINGS: Technical quality: Good. Heart rate: 55.  CORONARY ARTERIES: Assessment of the coronary arteries is nondiagnostic secondary to the small caliber and high calcification of the coronary arteries, as well as some motion related artifact, which limits accurate assessment for percentage of luminal stenosis.  AORTIC ROOT:  Aortic Valve Description: The aortic valve is tricuspid with marked thickening and calcification of each of the cusps.  Aortic Valve Area: 0.85 cm-sq  Aortic Annulus (systolic measurents):   Long-axis: 29.9 mm   Short-axis: 25.0 mm   Cross-sectional  area: 5.29 cm-sq   Circumference: 84.0 mm  Sinuses of Valsalva (diastolic measurements:   L-SOV - Width: 33.4 mm   Height: 18.9 mm   R-SOV - Width: 33.3 mm   Height: 21.4 mm   [place]-SOV - Width: 36.1 mm   Height: 25.7 mm  Coronary Artery [place]:   L Main [place] - 15.4 mm above the annulus.   RCA [place] - 15.8 mm above the annulus.  OTHER AORTA AND PULMONARY MEASUREMENTS:  Ascending aorta: (< 40 mm): 36 mm Descending aorta: (< 40 mm): 31 mm Main pulmonary artery: (< 30 mm): 25 mm  OTHER FINDINGS: There is a mosaic appearance of the lung parenchyma, suggesting air trapping from small airway disease.  The lateral segment of the right middle lobe there is a 4 mm nodule (image 51 of series 4) which is nonspecific. Architectural distortion in the left lower lobe likely reflects chronic scarring. No acute consolidative airspace disease. No pleural effusions. There are some patchy peripheral areas of subpleural reticulation and ground-glass attenuation with slight upper lung predominance, which is nonspecific, but could be indicative of an interstitial lung disease such as nonspecific interstitial pneumonia (NSIP). There is also a background of mild centrilobular emphysema and mild diffuse bronchial wall thickening. There are no aggressive appearing lytic or blastic lesions noted in the visualized portions of the skeleton.  IMPRESSION: 1. The severely sclerotic tricuspid aortic valve with estimated valve area of 0.85 cm2 by planimetry, compatible with severe aortic stenosis. Aortic root measurements pertinent to potential TAVR procedure, as detailed above. There are no adverse aortic root features to suggest potential contraindication to TAVR procedure. 2. Assessment of the coronary arteries was nondiagnostic on this examination secondary to the small  size of the vessels, high calcification of the vessels, and motion related artifact. 3. The appearance of the lungs suggests some degree of air trapping for small airway disease. The patient also has diffuse bronchial wall thickening and mild centrilobular emphysema, imaging findings compatible with underlying COPD. 4. There is a pattern of some subpleural reticulation with some patchy peripheral ground-glass attenuation that has a slight upper lung predominance. 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 5. 4 mm nodule in the lateral segment of the right middle lobe. If the patient is at high risk for bronchogenic carcinoma, follow-up chest CT at 1 year is recommended. If the patient is at low risk, no follow-up is needed. This recommendation follows the consensus statement: Guidelines for Management of Small Pulmonary Nodules Detected on CT Scans: A Statement from the Fleischner Society as published in Radiology 2005; 237:395-400.   Original Report Authenticated By: Trudie Reed, M.D.      CT ANGIOGRAPHY CHEST, ABDOMEN AND PELVIS  TECHNIQUE: Multidetector CT imaging through the chest, abdomen and pelvis was performed using the standard protocol during bolus administration of intravenous contrast. Multiplanar reconstructed images and MIPs were obtained and reviewed to evaluate the vascular anatomy.  CONTRAST: OMNIPAQUE IOHEXOL 350 MG/ML SOLN  COMPARISON: CT of the abdomen and pelvis 10/21/2012.  FINDINGS: CT CHEST FINDINGS  Mediastinum: Heart size is mildly enlarged with mild left ventricular dilatation, and areas of myocardial thinning involving the inferolateral wall of the left ventricle, compatible with scarring from prior myocardial infarction. There is atherosclerosis of the thoracic aorta, the great vessels of the mediastinum and the coronary arteries, including calcified atherosclerotic  plaque in the left main, left anterior descending, left circumflex and right coronary arteries. Severe thickening and calcification of the aortic valve. There is no significant pericardial fluid, thickening or pericardial calcification. No pathologically enlarged mediastinal or hilar lymph nodes. Small hiatal hernia. 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  Lungs/Pleura: Extensive scarring throughout the basal segments of the lower lobes of the lungs bilaterally. There is a background of mild diffuse ground-glass attenuation with mild interlobular septal thickening, favored to reflect mild interstitial pulmonary edema. Trace bilateral pleural effusions layering dependently.  Musculoskeletal: Old compression fracture of T5 with approximately 15% loss of anterior vertebral body height. There are no aggressive appearing lytic or blastic lesions noted in the visualized portions of the skeleton.  CT ABDOMEN AND PELVIS FINDINGS  Hepatobiliary: 8 mm  partially calcified gallstone in the dependent portion of the gallbladder. No current findings to suggest an acute cholecystitis at this time. Multiple low-attenuation hepatic lesions, many of which are too small to definitively characterize. These lesions are generally similar in size, number and pattern of distribution compared to the prior study, compatible with benign lesions such as simple cysts. The largest of these lesions are all compatible with simple cysts, including a 2.6 cm lesion in the periphery of segment for a. One of the lesions previously noted in the lateral aspect of segment 2 has regressed, presumably secondary to spontaneous involution. No intra or extrahepatic biliary ductal dilatation.  Pancreas: Unremarkable.  Spleen: Unremarkable.  Adrenals/Urinary Tract: 1.2 x 0.9 cm right adrenal nodule is unchanged, and although incompletely characterized is favored to be benign  lesions such as a small had adenoma. Minimal thickening of the left adrenal gland is also unchanged, potentially related of mild adrenal hyperplasia. There are multiple sub cm low-attenuation lesions in the kidneys bilaterally which are too small to definitively characterize, but stable compared to the prior study, favored to represent tiny cysts. In addition, there is an exophytic 2 cm low-attenuation lesion lower pole of the right kidney, which again has some dependent high attenuation debris, compatible with a minimally complex cyst containing proteinaceous or hemorrhagic debris.  Stomach/Bowel: Small hiatal hernia ; the stomach is otherwise normal in appearance. No pathologic dilatation of the small bowel or colon. Numerous colonic diverticulae are noted, most severe in the descending and sigmoid colon, without surrounding inflammatory changes to suggest an acute diverticulitis at this time. Normal appendix.  Vascular/Lymphatic: Extensive atherosclerosis throughout the abdominal and pelvic vasculature, with vascular findings and measurements pertinent to potential TAVR procedure, as detailed below. Image 188 of series 4 again demonstrates a small ulcerated plaque of the infrarenal abdominal aorta immediately above the IMA origin (unchanged). No pathologically enlarged lymph nodes in the abdomen or pelvis.  Reproductive: Prostate gland is unremarkable in appearance.  Other: No significant volume of ascites. No pneumoperitoneum.  Musculoskeletal: Multiple old vertebral body compression fractures in the lumbar spine appear unchanged, most severe at L3 where there is 70% loss of anterior vertebral body height and 30% loss of posterior vertebral body height. 5 mm of anterolisthesis of L4 upon L5 is unchanged. There are no aggressive appearing lytic or blastic lesions noted in the visualized portions of the skeleton. Well-defined sclerotic lesion in the right side of the ileum  is unchanged, compatible with a bone island.  VASCULAR MEASUREMENTS PERTINENT TO TAVR:  AORTA:  Minimal Aortic Diameter - 13 x 9 mm in the infrarenal abdominal aorta at the level of the IMA origin  Severity of Aortic Calcification - severe  RIGHT PELVIS:  Right Common Iliac Artery -  Minimal Diameter - 12.9 x 7.5 mm  Tortuosity - moderate  Calcification - moderate  Right External Iliac Artery -  Minimal Diameter - 9.5 x 10.9 mm  Tortuosity - severe  Calcification - mild  Right Common Femoral Artery -  Minimal Diameter - 10.0 x 7.3 mm  Tortuosity - mild  Calcification - moderate  LEFT PELVIS:  Left Common Iliac Artery -  Minimal Diameter - 11.7 x 10.3 mm  Tortuosity - moderate  Calcification - mild  Left External Iliac Artery -  Minimal Diameter - 9.1 x 9.9 mm  Tortuosity - moderate  Calcification - mild  Left Common Femoral Artery -  Minimal Diameter - 10.0 x 8.3 mm  Tortuosity - mild  Calcification -  moderate  Review of the MIP images confirms the above findings.  IMPRESSION: 1. Vascular findings and measurements pertinent to potential TAVR procedure, as detailed above. This patient does appear to have suitable pelvic arterial access. 2. Cardiomegaly with mild left ventricular dilatation and scarring in the inferolateral wall of the left ventricle, presumably from prior myocardial infarction. At this time, there is evidence of mild interstitial pulmonary edema with trace bilateral pleural effusions, suggesting mild congestive heart failure. 3. Extensive colonic diverticulosis without findings to suggest acute diverticulitis at this time. 4. Cholelithiasis without evidence of acute cholecystitis at this time. 5. Additional incidental findings, similar to the prior examination, as detailed above.   Electronically Signed  By: Trudie Reed M.D.  On: 07/23/2014 12:07     STS Risk  Calculator  Procedure                                         AVR + CABG  Risk of Mortality                                13.1% Morbidity or Mortality                       51.9% Prolonged LOS                                   34.2% Short LOS                                           4.9% Permanent Stroke                            5.2% Prolonged Vent Support                      37.1% DSW Infection                                     0.9% Renal Failure                                       17.5% Reoperation                                        19.0%     Impression:  The patient has stage D severe symptomatic aortic stenosis with moderate left ventricular systolic function and history of multivessel coronary artery disease that is incompletely revascularized.  He has numerous comorbid medical problems and very limited physical mobility which would make conventional surgical aortic valve replacement with or without coronary artery bypass surgery extremely high risk.  I do not feel the patient should be considered a candidate for conventional surgery. However, in the past he demonstrated significant clinical improvement following balloon aortic valvuloplasty, and given his recent progression of symptoms and findings on  transthoracic echocardiogram, it might be reasonable to consider transcatheter aortic valve replacement for definitive management of his aortic stenosis.   Plan:  The patient and his accompanying friends and family were counseled at length regarding treatment alternatives for management of severe symptomatic aortic stenosis. Alternative approaches such as conventional aortic valve replacement, transcatheter aortic valve replacement, and palliative medical therapy were compared and contrasted at length.  The risks associated with conventional surgical aortic valve replacement were been discussed in detail, as were reasons why I don't feel that conventional surgery should be  considered an option. Long-term prognosis with medical therapy was discussed. Expectations for his convalescence following transcatheter aortic valve replacement with or without complications were discussed.  This discussion was placed in the context of the patient's own specific clinical presentation and past medical history.  All of their questions been addressed.     During the course of the patient's preoperative work up they have been evaluated comprehensively by a multidisciplinary team of specialists coordinated through the Multidisciplinary [facility] Valve Clinic in the [facility]. He has been demonstrated to suffer from symptomatic severe aortic stenosis as noted above. The patient has been counseled extensively as to the relative risks and benefits of all options for the treatment of severe aortic stenosis including long term medical therapy, conventional surgery for aortic valve replacement, and transcatheter aortic valve replacement. The patient has been independently evaluated by two cardiac surgeons including myself and Dr. Laneta Simmers as well as Dr. Excell Seltzer of interventional cardiology and he is felt to be at high risk for conventional surgical aortic valve replacement and CABG based upon a predicted risk of mortality using the Society of Thoracic Surgeons risk calculator of 13%.   Based upon review of all of the patient's preoperative diagnostic tests they are felt to be candidate for transcatheter aortic valve replacement using the transfemoral approach as an alternative to high risk conventional surgery.   Following the decision to proceed with transcatheter aortic valve replacement, a discussion has been held regarding what types of management strategies would be attempted intraoperatively in the event of life-threatening complications, including whether or not the patient would be considered a candidate for the use of cardiopulmonary bypass and/or conversion to open  sternotomy for attempted surgical intervention. The patient has been advised of a variety of complications that might develop including but not limited to risks of death, stroke, paravalvular leak, aortic dissection or other major vascular complications, aortic annulus rupture, device embolization, cardiac rupture or perforation, mitral regurgitation, acute myocardial infarction, arrhythmia, heart block or bradycardia requiring permanent pacemaker placement, congestive heart failure, respiratory failure, renal failure, pneumonia, infection, other late complications related to structural valve deterioration or migration, or other complications that might ultimately cause a temporary or permanent loss of functional independence or other long term morbidity. The patient provides full informed consent for the procedure as described and all questions were answered.   Salvatore Decent. Cornelius Moras, MD

## 2014-08-10 NOTE — Interval H&P Note (Signed)
History and Physical Interval Note:  08/10/2014 6:53 AM  Eugene [name]  has presented today for surgery, with the diagnosis of severe aortic stenosis  The various methods of treatment have been discussed with the patient and family. After consideration of risks, benefits and other options for treatment, the patient has consented to  Procedure(s): TRANSCATHETER AORTIC VALVE REPLACEMENT, TRANSFEMORAL (N/A) TRANSESOPHAGEAL ECHOCARDIOGRAM (TEE) (N/A) as a surgical intervention .  The patient's history has been reviewed, patient examined, no change in status, stable for surgery.  I have reviewed the patient's chart and labs.  Questions were answered to the patient's satisfaction.    Pt personally interviewed and evaluated. He is very well-known to me from the outpatient. He's been seen by the Multidisciplinary Heart Team (Dr Cornelius Moraswen and Dr Laneta SimmersBartle) with plans for TAVR via a transfemoral approach today. His case has been carefully considered and he has been counseled extensively as outlined above. No changes in his symptoms since evaluation by Dr Cornelius Moraswen above.  All questions answered.   Tonny BollmanMichael Dia Donate

## 2014-08-10 NOTE — Progress Notes (Signed)
  Echocardiogram Echocardiogram Transesophageal has been performed.  Dorothey BasemanReel, Loida Calamia M 08/10/2014, 10:07 AM

## 2014-08-10 NOTE — Anesthesia Procedure Notes (Signed)
Procedures   The patient was identified and consent obtained.  TO was performed, and full barrier precautions were used.  The skin was anesthetized with lidocaine.  Once the vein was located with the 22 ga. needle using ultrasound guidance , the wire was inserted into the vein.  The wire location was confirmed with ultrasound.  The insertion site was dilated and the introducer was carefully inserted and sutured in place. The PAC was checked, and floated into the PA.  Once in the PA, the catheter was secured. The patient tolerated the procedure well.  CXR was ordered for PACU. Start: [id number] End: 0706 [address] Jabsaniel Cheyne Boulden, MD

## 2014-08-10 NOTE — Progress Notes (Signed)
[facility] BRIEF SICU PROGRESS NOTE  Day of Surgery  S/P Procedure(s) (LRB): TRANSCATHETER AORTIC VALVE REPLACEMENT, TRANSFEMORAL (N/A) TRANSESOPHAGEAL ECHOCARDIOGRAM (TEE) (N/A)   Looks good.  Denies pain, SOB NSR w/ stable BP O2 sats 94-96% on 4 L/min UOP 35-50 mL/hr CXR w/ mild CHF  Plan: Continue routine early postop.  Will give 1 dose lasix  Muhamed Luecke H 08/10/2014 9:02 PM

## 2014-08-10 NOTE — Transfer of Care (Signed)
Immediate Anesthesia Transfer of Care Note  Patient: Barrie Dunkerfird N Sindelar  Procedure(s) Performed: Procedure(s): TRANSCATHETER AORTIC VALVE REPLACEMENT, TRANSFEMORAL (N/A) TRANSESOPHAGEAL ECHOCARDIOGRAM (TEE) (N/A)  Patient Location: PACU  Anesthesia Type:General  Level of Consciousness: awake, alert , oriented and patient cooperative  Airway & Oxygen Therapy: Patient Spontanous Breathing and Patient connected to face mask  Post-op Assessment: Report given to PACU RN, Post -op Vital signs reviewed and stable and Patient moving all extremities  Post vital signs: Reviewed and stable  Complications: No apparent anesthesia complications

## 2014-08-10 NOTE — Anesthesia Postprocedure Evaluation (Signed)
  Anesthesia Post-op Note  Patient: Barrie Dunkerfird N Cech  Procedure(s) Performed: Procedure(s): TRANSCATHETER AORTIC VALVE REPLACEMENT, TRANSFEMORAL (N/A) TRANSESOPHAGEAL ECHOCARDIOGRAM (TEE) (N/A)  Patient Location: ICU  Anesthesia Type:General  Level of Consciousness: awake, alert  and oriented  Airway and Oxygen Therapy: Patient Spontanous Breathing  Post-op Pain: none  Post-op Assessment: Post-op Vital signs reviewed  Post-op Vital Signs: Reviewed  Last Vitals:  Filed Vitals:   08/10/14 1400  BP: 147/69  Pulse: 60  Temp: 37.7 C  Resp: 22    Complications: No apparent anesthesia complications

## 2014-08-10 NOTE — Op Note (Signed)
CARDIOTHORACIC SURGERY OPERATIVE NOTE  Date of Procedure:  08/10/2014  Preoperative Diagnosis: Severe Aortic Stenosis   Postoperative Diagnosis: Same   Procedure:    Transcatheter Aortic Valve Replacement - Open Left Transfemoral Approach  Edwards Sapien XT (size 29 mm, model # 9300TFX, serial # [id number])   Co-Surgeons:  Salvatore Decentlarence H. Cornelius Moraswen, MD and Tonny BollmanMichael Cooper, MD  Assistants:   Alleen BorneBryan K. Bartle, MD and Verne Carrowhristopher McAlhany, MD  Anesthesiologist:  Marcene Duosobert Fitzgerald, MD  Echocardiographer:  Tobias AlexanderKatarina Nelson, MD  Pre-operative Echo Findings:  Severe aortic stenosis  Mild aortic insufficiency  Mild-moderate mitral regurgitation  Moderate left ventricular systolic dysfunction  Post-operative Echo Findings:  Trivial paravalvular leak  Unchanged left ventricular systolic function      DETAILS OF THE OPERATIVE PROCEDURE  The majority of the procedure is documented separately in a procedure note by Dr. [name].   TRANSFEMORAL ACCESS:   A small incision is made in the left groin immediately over the common femoral artery. The subcutaneous tissues are divided with electrocautery and the anterior surface of the common femoral artery is identified. Sharp dissection is utilized to free up the artery proximally and distally and the vessel is encircled with a vessel loop.  A pair of CV-4 Gore-tex sutures are place as diamond-shaped purse-strings on the anterior surface of the femoral artery.  The patient is heparinized systemically and ACT verified > 250 seconds.  The common femoral artery is punctured using an 18 gauge needle and a soft J-tipped guidewire is passed into the common iliac artery under fluoroscopic guidance.  A 6 Fr straight diagnostic catheter is placed over the guidewire and the guidewire is removed.  An Amplatz super stiff guidewire is passed through the sheath into the descending thoracic aorta and the introducing diagnostic catheter is removed.  Serial dilators are  passed over the guidewire under continuous fluoroscopic guidance, making certain that each dilator passes easily all of the way into the distal abdominal aorta.  A 20 Fr Edwards Novoflex Plus introducer sheath is passed over the guidewire into the abdominal aorta.  The introducing dilator is removed, the sheath is flushed with heparinized saline, and the sheath is secured to the skin.    FEMORAL SHEATH REMOVAL AND ARTERIAL CLOSURE:  After the completion of successful valve deployment as documented separately by Dr. [name], the femoral artery sheath is removed and the arteriotomy is closed using the previously placed Gore-tex purse-string sutures. Once the repair has been completed protamine was administered to reverse the anticoagulation. A digitally-subtracted arteriogram is obtained from just above the aortic bifurcation to below the arteriotomy to confirm the integrity of the vascular repair.  The incision is irrigated with saline solution and subsequently closed in multiple layers using absorbable suture.  The skin incision is closed using a subcuticular skin closure.     Salvatore Decentlarence H. Cornelius Moraswen MD 08/10/2014 10:49 AM

## 2014-08-10 NOTE — Op Note (Signed)
[facility]  TAVR OPERATIVE NOTE   Date of Procedure:  08/10/2014  Preoperative Diagnosis: Severe Aortic Stenosis   Postoperative Diagnosis: Same   Procedure:    Transcatheter Aortic Valve Replacement - Transfemoral Approach  Edwards Sapien XT THV (size 29 mm, model # 9300TFX, serial # [id number])   Co-Surgeons:  Salvatore Decentlarence H. Cornelius Moraswen, MD and Tonny BollmanMichael Anothony Bursch, MD  Assistants:   Alleen BorneBryan K. Bartle, MD and Verne Carrowhristopher McAlhany, MD  Anesthesiologist:  Marcene Duosobert Fitzgerald, MD  Echocardiographer:  Tobias AlexanderKatarina Nelson, MD  Pre-operative Echo Findings:  Severe aortic stenosis  Normal left ventricular systolic function  2+ MR  Post-operative Echo Findings:  Trace paravalvular leak  Normal left ventricular systolic function  2+ MR  BRIEF CLINICAL NOTE AND INDICATIONS FOR SURGERY This is an 80 year-old male with severe symptomatic aortic stenosis. His complex history is well-outlined in the notes of the Multidisciplinary Heart Team.   During the course of the patient's preoperative work up they have been evaluated comprehensively by a multidisciplinary team of specialists coordinated through [facility].  They have been demonstrated to suffer from symptomatic severe aortic stenosis as noted above. The patient has been counseled extensively as to the relative risks and benefits of all options for the treatment of severe aortic stenosis including long term medical therapy, conventional surgery for aortic valve replacement, and transcatheter aortic valve replacement.  The patient has been independently evaluated by two cardiac surgeons including Dr Cornelius Moraswen and Dr. Laneta SimmersBartle, and they are felt to be at high risk for conventional surgical aortic valve replacement based upon a predicted risk of mortality using the Society of Thoracic Surgeons risk calculator of 13%.  Based upon review of all of the patient's preoperative  diagnostic tests they are felt to be candidate for transcatheter aortic valve replacement using the transfemoral approach as an alternative to high risk conventional surgery.    Following the decision to proceed with transcatheter aortic valve replacement, a discussion has been held regarding what types of management strategies would be attempted intraoperatively in the event of life-threatening complications, including whether or not the patient would be considered a candidate for the use of cardiopulmonary bypass and/or conversion to open sternotomy for attempted surgical intervention.  The patient has been advised of a variety of complications that might develop peculiar to this approach including but not limited to risks of death, stroke, paravalvular leak, aortic dissection or other major vascular complications, aortic annulus rupture, device embolization, cardiac rupture or perforation, acute myocardial infarction, arrhythmia, heart block or bradycardia requiring permanent pacemaker placement, congestive heart failure, respiratory failure, renal failure, pneumonia, infection, other late complications related to structural valve deterioration or migration, or other complications that might ultimately cause a temporary or permanent loss of functional independence or other long term morbidity.  The patient provides full informed consent for the procedure as described and all questions were answered preoperatively.    DETAILS OF THE OPERATIVE PROCEDURE  PREPARATION:    The patient is brought to the operating room on the above mentioned date and central monitoring was established by the anesthesia team including placement of Swan-Ganz catheter and radial arterial line. The patient is placed in the supine position on the operating table.  Intravenous antibiotics are administered. General endotracheal anesthesia is induced uneventfully. A Foley catheter is placed.  Baseline transesophageal echocardiogram  was performed. The patient's chest, abdomen, both groins, and both lower extremities are prepared and draped in a  sterile manner. A time out procedure is performed.   PERIPHERAL ACCESS:    Using the modified Seldinger technique, femoral arterial and venous access was obtained with placement of 6 Fr sheaths on the right side.  A pigtail diagnostic catheter was passed through the right femoral arterial sheath under fluoroscopic guidance into the aortic root.  A temporary transvenous pacemaker catheter was passed through the right femoral venous sheath under fluoroscopic guidance into the right ventricle.  The pacemaker was tested to ensure stable lead placement and pacemaker capture. Aortic root angiography was performed in order to determine the optimal angiographic angle for valve deployment.   TRANSFEMORAL ACCESS:   A left femoral arterial cutdown was performed by Dr Cornelius Moraswen. Please see his separate operative note for details. The patient was heparinized systemically and ACT verified > 250 seconds.    A 20 Fr transfemoral sheath (E-Sheath) was introduced into the left femoral artery after progressively dilating over an Amplatz superstiff wire. An AL-2 catheter was used to direct a straight-tip exchange length wire across the native aortic valve into the left ventricle. This was exchanged out for a pigtail catheter and position was confirmed in the LV apex. Simultaneous LV and Ao pressures were recorded.  The pigtail catheter was then exchanged for an Amplatz Extra-stiff wire in the LV apex. At that point, BAV was performed using a 25 mm valvuloplasty balloon.  Once optimal position was achieved, BAV was done under rapid ventricular pacing at 180 bpm. The patient recovered well hemodynamically.   TRANSCATHETER HEART VALVE DEPLOYMENT:  An Edwards Sapien XT THV (size 29 mm) was prepared and crimped per manufacturer's guidelines, and the proper orientation of the valve is confirmed on the [employer] InternationalEdwards Novoflex  Plus delivery system. The valve was advanced through the introducer sheath using normal technique until in an appropriate position in the abdominal aorta beyond the sheath tip. The balloon was then retracted and using the fine-tuning wheel was centered on the valve. The valve was then advanced across the aortic arch using appropriate flexion of the catheter. The valve was carefully positioned across the aortic valve annulus. The Novaflex catheter was retracted using normal technique. Once final position of the valve has been confirmed by angiographic assessment, the valve is deployed while temporarily holding ventilation and during rapid ventricular pacing to maintain systolic blood pressure < 50 mmHg and pulse pressure < 10 mmHg. The balloon inflation is held for >3 seconds after reaching full deployment volume. Once the balloon has fully deflated the balloon is retracted into the ascending aorta and valve function is assessed using TEE. There is felt to be trace paravalvular leak and no central aortic insufficiency.  The patient's hemodynamic recovery following valve deployment is good.  The deployment balloon and guidewire are both removed. Echo demostrated acceptable post-procedural gradients, stable mitral valve function, and trace AI.   PROCEDURE COMPLETION:  The sheath was then removed and arteriotomy repaired by Dr Cornelius Moraswen. Please see his separate report for details. Distal abdominal aortography was performed to evaluate for any arterial injury related to the procedure. There was no evidence dissection, perforation, or other vascular injury in the abdominal aorta, iliac artery, or femoral artery.  Protamine was administered once femoral arterial repair was complete. The temporary pacemaker, pigtail catheters and femoral sheaths were removed with manual pressure used for hemostasis.   The patient tolerated the procedure well and is transported to the surgical intensive care in stable condition. There were  no immediate intraoperative complications. All sponge instrument and  needle counts are verified correct at completion of the operation.   No blood products were administered during the operation.  The patient received a total of 71 mL of intravenous contrast during the procedure.  Tonny Bollman MD 08/10/2014 10:14 AM

## 2014-08-10 NOTE — Progress Notes (Signed)
CAregiver with pt., unsure about medicine schedule, thinks he had several meds today that was not scheduled.

## 2014-08-11 ENCOUNTER — Encounter (HOSPITAL_COMMUNITY): Payer: Self-pay | Admitting: Cardiovascular Disease

## 2014-08-11 ENCOUNTER — Inpatient Hospital Stay (HOSPITAL_COMMUNITY): Payer: Medicare Other

## 2014-08-11 DIAGNOSIS — R001 Bradycardia, unspecified: Secondary | ICD-10-CM | POA: Diagnosis not present

## 2014-08-11 DIAGNOSIS — D696 Thrombocytopenia, unspecified: Secondary | ICD-10-CM | POA: Diagnosis not present

## 2014-08-11 DIAGNOSIS — I350 Nonrheumatic aortic (valve) stenosis: Principal | ICD-10-CM

## 2014-08-11 DIAGNOSIS — I5043 Acute on chronic combined systolic (congestive) and diastolic (congestive) heart failure: Secondary | ICD-10-CM | POA: Diagnosis not present

## 2014-08-11 DIAGNOSIS — I059 Rheumatic mitral valve disease, unspecified: Secondary | ICD-10-CM

## 2014-08-11 LAB — BASIC METABOLIC PANEL
Anion gap: 11 (ref 5–15)
BUN: 17 mg/dL (ref 6–23)
CO2: 26 mEq/L (ref 19–32)
CREATININE: 0.99 mg/dL (ref 0.50–1.35)
Calcium: 8.9 mg/dL (ref 8.4–10.5)
Chloride: 102 mEq/L (ref 96–112)
GFR calc Af Amer: 82 mL/min — ABNORMAL LOW (ref >90)
GFR, EST NON AFRICAN AMERICAN: 71 mL/min — AB (ref >90)
Glucose, Bld: 99 mg/dL (ref 70–99)
POTASSIUM: 3.8 meq/L (ref 3.7–5.3)
Sodium: 139 mEq/L (ref 137–147)

## 2014-08-11 LAB — CBC
HEMATOCRIT: 37.8 % — AB (ref 39.0–52.0)
Hemoglobin: 12.6 g/dL — ABNORMAL LOW (ref 13.0–17.0)
MCH: 31.3 pg (ref 26.0–34.0)
MCHC: 33.3 g/dL (ref 30.0–36.0)
MCV: 93.8 fL (ref 78.0–100.0)
Platelets: 97 10*3/uL — ABNORMAL LOW (ref 150–400)
RBC: 4.03 MIL/uL — AB (ref 4.22–5.81)
RDW: 14.3 % (ref 11.5–15.5)
WBC: 6.4 10*3/uL (ref 4.0–10.5)

## 2014-08-11 LAB — GLUCOSE, CAPILLARY
GLUCOSE-CAPILLARY: 122 mg/dL — AB (ref 70–99)
GLUCOSE-CAPILLARY: 82 mg/dL (ref 70–99)
Glucose-Capillary: 107 mg/dL — ABNORMAL HIGH (ref 70–99)
Glucose-Capillary: 123 mg/dL — ABNORMAL HIGH (ref 70–99)
Glucose-Capillary: 123 mg/dL — ABNORMAL HIGH (ref 70–99)
Glucose-Capillary: 83 mg/dL (ref 70–99)
Glucose-Capillary: 98 mg/dL (ref 70–99)

## 2014-08-11 LAB — TYPE AND SCREEN
ABO/RH(D): O POS
ANTIBODY SCREEN: NEGATIVE
UNIT DIVISION: 0
Unit division: 0

## 2014-08-11 LAB — MAGNESIUM: Magnesium: 1.8 mg/dL (ref 1.5–2.5)

## 2014-08-11 MED ORDER — SODIUM CHLORIDE 0.9 % IJ SOLN
3.0000 mL | Freq: Two times a day (BID) | INTRAMUSCULAR | Status: DC
  Administered 2014-08-11: 3 mL via INTRAVENOUS
  Administered 2014-08-12: 10 mL via INTRAVENOUS
  Administered 2014-08-13 – 2014-08-14 (×2): 3 mL via INTRAVENOUS

## 2014-08-11 MED ORDER — SODIUM CHLORIDE 0.9 % IJ SOLN: 3.0000 mL | INTRAMUSCULAR | Status: DC | PRN

## 2014-08-11 MED ORDER — SPIRONOLACTONE 12.5 MG HALF TABLET
12.5000 mg | ORAL_TABLET | ORAL | Status: DC
  Administered 2014-08-11 – 2014-08-13 (×2): 12.5 mg via ORAL
  Filled 2014-08-11 (×2): qty 1

## 2014-08-11 MED ORDER — FUROSEMIDE 20 MG PO TABS: 20.0000 mg | ORAL_TABLET | Freq: Every morning | ORAL | Status: DC

## 2014-08-11 MED ORDER — FUROSEMIDE 20 MG PO TABS
20.0000 mg | ORAL_TABLET | Freq: Every morning | ORAL | Status: DC
Start: 2014-08-12 — End: 2014-08-14
  Administered 2014-08-12 – 2014-08-14 (×3): 20 mg via ORAL
  Filled 2014-08-11 (×3): qty 1

## 2014-08-11 MED ORDER — FUROSEMIDE 40 MG PO TABS: 40.0000 mg | ORAL_TABLET | Freq: Every morning | ORAL | Status: DC

## 2014-08-11 MED ORDER — PANTOPRAZOLE SODIUM 40 MG PO TBEC
40.0000 mg | DELAYED_RELEASE_TABLET | Freq: Every day | ORAL | Status: DC
  Administered 2014-08-11 – 2014-08-14 (×4): 40 mg via ORAL
  Filled 2014-08-11 (×4): qty 1

## 2014-08-11 MED ORDER — LOSARTAN POTASSIUM 50 MG PO TABS
50.0000 mg | ORAL_TABLET | Freq: Every day | ORAL | Status: DC
Start: 2014-08-11 — End: 2014-08-14
  Administered 2014-08-11 – 2014-08-14 (×4): 50 mg via ORAL
  Filled 2014-08-11 (×4): qty 1

## 2014-08-11 MED ORDER — FUROSEMIDE 10 MG/ML IJ SOLN
20.0000 mg | Freq: Once | INTRAMUSCULAR | Status: AC
  Administered 2014-08-11: 20 mg via INTRAVENOUS
  Filled 2014-08-11: qty 2

## 2014-08-11 MED ORDER — SODIUM CHLORIDE 0.9 % IV SOLN: 250.0000 mL | INTRAVENOUS | Status: DC | PRN

## 2014-08-11 MED ORDER — VITAMIN B-12 1000 MCG PO TABS
1000.0000 ug | ORAL_TABLET | Freq: Every day | ORAL | Status: DC
  Administered 2014-08-11 – 2014-08-14 (×4): 1000 ug via ORAL
  Filled 2014-08-11 (×4): qty 1

## 2014-08-11 MED ORDER — MOVING RIGHT ALONG BOOK
Freq: Once | Status: AC
  Administered 2014-08-11: 10:00:00
  Filled 2014-08-11: qty 1

## 2014-08-11 MED FILL — Dextrose Inj 5%: INTRAVENOUS | Qty: 250 | Status: AC

## 2014-08-11 MED FILL — Heparin Sodium (Porcine) Inj 1000 Unit/ML: INTRAMUSCULAR | Qty: 30 | Status: AC

## 2014-08-11 MED FILL — Potassium Chloride Inj 2 mEq/ML: INTRAVENOUS | Qty: 40 | Status: AC

## 2014-08-11 MED FILL — Norepinephrine Bitartrate IV Soln 1 MG/ML (Base Equivalent): INTRAVENOUS | Qty: 4 | Status: AC

## 2014-08-11 MED FILL — Magnesium Sulfate Inj 50%: INTRAMUSCULAR | Qty: 10 | Status: AC

## 2014-08-11 MED FILL — Insulin Regular (Human) Inj 100 Unit/ML: INTRAMUSCULAR | Qty: 1 | Status: AC

## 2014-08-11 NOTE — Progress Notes (Signed)
Patient is confused at times, can be reoriented most of the time.  Patient is very impulsive and has been trying to get out of bed, was told by previous nursing staff that patient has history exhibiting signs of dementia.  Patient bed is in the low position, bed alarm is on (will implement chair alarm when patient is placed in chair), patient has nursing staff at bedside to keep patient visible.  Will monitor and keep patient as safe as can be.  VS stable

## 2014-08-11 NOTE — Progress Notes (Signed)
  Echocardiogram 2D Echocardiogram has been performed.  Leta JunglingCooper, Escher Harr M 08/11/2014, 1:10 PM

## 2014-08-11 NOTE — Progress Notes (Signed)
Patient was assisted to sit up on the side of the bed and was assisted to standing position on scale (2-3 assist), placed placed in chair, chair was equipped with chair alarm on, patient was reclined per his request, TV was turned on per his request, and glasses and hearing aids were put on/in patient.  RN sitting at bedside for patient's safety. VS Stable

## 2014-08-11 NOTE — Progress Notes (Signed)
Pt unable to stand or sit on stool safely for 2 view chest xray scheduled for am. Order d/c'd and [facility]

## 2014-08-11 NOTE — Progress Notes (Signed)
PM SICU Rounds  Patient resting comfortably in bed Stable hemodynamics Patient examined and record reviewed. stable,labs Patient had stable day.Continue current care. VAN TRIGT III,Meckenzie Balsley 08/11/2014

## 2014-08-11 NOTE — Progress Notes (Addendum)
[address]       [place] [id number]             [phone]        CARDIOTHORACIC SURGERY PROGRESS NOTE   R1 Day Post-Op Procedure(s) (LRB): TRANSCATHETER AORTIC VALVE REPLACEMENT, TRANSFEMORAL (N/A) TRANSESOPHAGEAL ECHOCARDIOGRAM (TEE) (N/A)  Subjective: No events overnight, patient seems to be recovering very well. Patient has no complaints.  Objective: Vital signs: BP Readings from Last 1 Encounters:  08/11/14 144/80   Pulse Readings from Last 1 Encounters:  08/11/14 80   Resp Readings from Last 1 Encounters:  08/11/14 26   Temp Readings from Last 1 Encounters:  08/11/14 98.8 F (37.1 C) Oral    Hemodynamics: PAP: (39-53)/(13-22) 39/13 mmHg CO:  [3.3 L/min-4.8 L/min] 4.8 L/min CI:  [1.6 L/min/m2-2.4 L/min/m2] 2.4 L/min/m2  Physical Exam:  Rhythm:   NSR  Breath sounds: Clear to auscultation bilaterally  Heart sounds:  Normal S1 and S2  Incisions:  Clean, appropriately dressed.  Abdomen:  Soft, non tender, non distended  Extremities:  Warm, well profused   Intake/Output from previous day: 11/03 0701 - 11/04 0700 In: 2495.6 [P.O.:400; I.V.:1745.6; IV Piggyback:350] Out: 3160 [Urine:3060; Blood:100] Intake/Output this shift:    Lab Results:  CBC: Recent Labs  08/10/14 1100 08/10/14 1105 08/11/14 0430  WBC 7.7  --  6.4  HGB 12.9* 12.6* 12.6*  HCT 39.1 37.0* 37.8*  PLT 95*  --  97*    BMET:  Recent Labs  08/10/14 1011 08/10/14 1105 08/11/14 0430  NA 133* 140 139  K 3.7 4.0 3.8  CL 112  --  102  CO2  --   --  26  GLUCOSE 158* 140* 99  BUN 19  --  17  CREATININE 1.00  --  0.99  CALCIUM  --   --  8.9     CBG (last 3)   Recent Labs  08/10/14 2006 08/11/14 0036 08/11/14 0039  GLUCAP 122* 82 83    ABG    Component Value Date/Time   PHART 7.346* 08/10/2014 1110   PCO2ART 44.3 08/10/2014 1110   PO2ART 85.0 08/10/2014 1110   HCO3 24.3* 08/10/2014 1110   TCO2 26 08/10/2014 1110   ACIDBASEDEF 2.0  08/10/2014 1110   O2SAT 96.0 08/10/2014 1110    CXR:  EXAM: PORTABLE CHEST - 1 VIEW  COMPARISON: Portable chest x-ray of August 10, 2014  FINDINGS: The lungs are adequately inflated. The pulmonary interstitial markings have markedly improved bilaterally. The left hemidiaphragm is slightly less well demonstrated today however. The cardiopericardial silhouette remains enlarged. The pulmonary vascularity is more distinct today. The right internal jugular Cordis sheath tip projects over the proximal portion of the SVC. The Swan-Ganz catheter has been removed. There is chronic deformity of the right shoulder and proximal humeral shaft.  IMPRESSION: Improved pulmonary edema consistent with resolving CHF.   Electronically Signed  By: David SwazilandJordan  On: 08/11/2014 08:18  Assessment/Plan: S/P Procedure(s) (LRB): TRANSCATHETER AORTIC VALVE REPLACEMENT, TRANSFEMORAL (N/A) TRANSESOPHAGEAL ECHOCARDIOGRAM (TEE) (N/A)  Patient recovering well one day after TAVR procedure. Pulmonary edema secondary to CHF resolving after Lasix dose given last evening, evident per CXR. Continue to monitor I/O, daily weights. Expect transfer to floor today or tomorrow if patient continues to progress well.   Elvis CoilCooper, Rebecca, PA-S2 08/11/2014 8:23 AM   I have seen and examined the patient and agree with the assessment and plan as outlined.  Doing very well POD1.  Mobilize.  Diuresis.  Resume pre-op meds for HTN.  ASA + Plavix.  Routine f/u ECHO today.  Transfer step-down.  PT/OT consult.  I spent in excess of 15 minutes during the conduct of this hospital encounter and >50% of this time involved direct face-to-face encounter with the patient for counseling and/or coordination of their care.   Rhys Lichty H 08/11/2014 8:39 AM

## 2014-08-11 NOTE — Evaluation (Signed)
Occupational Therapy Evaluation Patient Details Name: Eugene [name] MRN: [id number] DOB: 18-Jan-1924 Today's Date: 08/11/2014    History of Present Illness Pt presents for TAVR with complex cardiac history. Lives with a roommate and has gait instability with h/o falls with and without syncope PTA.   Clinical Impression   Pt admitted with above. She demonstrates the below listed deficits and will benefit from continued OT to maximize safety and independence with BADLs.  Pt presents to OT with confusion and generalized weakness.  He appears to have done much better with OT than he did earlier today with PT.  He is able to perform BADLs with mod A - has decreased balance, decreased safety awareness, and impaired sequencing.  He has 24 hour assist at discharge and would like to discharge home.  Feel he may benefit from CIR.  He was able to perform BADLs with supervision to occasional min A PTA.       Follow Up Recommendations  Supervision/Assistance - 24 hour;CIR    Equipment Recommendations  3 in 1 bedside comode    Recommendations for Other Services       Precautions / Restrictions Precautions Precautions: Fall Restrictions Weight Bearing Restrictions: No      Mobility Bed Mobility Overal bed mobility: Needs Assistance;+2 for physical assistance Bed Mobility: Supine to Sit     Supine to sit: +2 for safety/equipment;Mod assist     General bed mobility comments: vc's for sequencing and pt confused by instructions, mod A to roll to left and elevate trunk from bed  Transfers Overall transfer level: Needs assistance Equipment used: Rolling walker (2 wheeled);2 person hand held assist Transfers: Sit to/from [employer]Stand;Stand Pivot Transfers Sit to Stand: Min assist Stand pivot transfers: Mod assist       General transfer comment: Pt moved sit to stand with min A.  Slight posterior lean     Balance Overall balance assessment: Needs assistance Sitting-balance support: Feet  supported Sitting balance-Leahy Scale: Fair Sitting balance - Comments: Able to bend forward to don/doff socks, with min guard assist    Standing balance support: Bilateral upper extremity supported Standing balance-Leahy Scale: Poor Standing balance comment: requires UE support  and mod A to maintain balance                            ADL Overall ADL's : Needs assistance/impaired Eating/Feeding: Set up;Sitting   Grooming: Oral care;Minimal assistance Grooming Details (indicate cue type and reason): Pt with difficulty sequencing task.   Upper Body Bathing: Minimal assitance;Sitting   Lower Body Bathing: Moderate assistance;Sit to/from stand   Upper Body Dressing : Moderate assistance;Sitting   Lower Body Dressing: Moderate assistance;Sit to/from stand Lower Body Dressing Details (indicate cue type and reason): able to don/doff Rt sock; requires mod A for Lt Toilet Transfer: Moderate assistance;Stand-pivot;BSC   Toileting- Clothing Manipulation and Hygiene: Moderate assistance;Sit to/from stand       Functional mobility during ADLs: Moderate assistance General ADL Comments: Pt eager to discharge home.      Vision                     Perception     Praxis      Pertinent Vitals/Pain Pain Assessment: No/denies pain Faces Pain Scale: Hurts little more Pain Location: left groin Pain Intervention(s): Monitored during session     Hand Dominance Right   Extremity/Trunk Assessment Upper Extremity Assessment Upper Extremity Assessment: LUE deficits/detail;RUE deficits/detail  RUE Deficits / Details: Shoulder elevation limited to ~80.  He reports he fractured it x 2 due to falls  LUE Deficits / Details: Pt with decreased isolated shoulder flexion    Lower Extremity Assessment Lower Extremity Assessment: Defer to PT evaluation RLE Deficits / Details: knees buckling in standing L>R LLE Deficits / Details: see RLE note   Cervical / Trunk  Assessment Cervical / Trunk Assessment: Kyphotic   Communication Communication Communication: HOH   Cognition Arousal/Alertness: Awake/alert Behavior During Therapy: Impulsive Overall Cognitive Status: Impaired/Different from baseline Area of Impairment: Orientation;Attention;Safety/judgement;[place] Recovery Scale Orientation Level: Disoriented to;Place Current Attention Level: Selective Memory: Decreased short-term memory;Decreased recall of precautions Following Commands: Follows one step commands consistently Safety/Judgement: Decreased awareness of safety   Problem Solving: Requires verbal cues General Comments: Pt not oriented to place, but able oriented to all other aspects.  He demonstrates impairments with organization and sequencing during grooming.  He requires verbal cues for safety and impulsivity    General Comments       Exercises       Shoulder Instructions      Home Living Family/patient expects to be discharged to:: Private residence Living Arrangements: Non-relatives/Friends Available Help at Discharge: Friend(s);Available PRN/intermittently Type of Home: House Home Access: Stairs to enter [employer] of Steps: 3 Entrance Stairs-Rails: Can reach both Home Layout: Two level Alternate Level Stairs-Number of Steps: 5-6 Alternate Level Stairs-Rails: Right;Left           Home Equipment: Walker - 2 wheels;Shower seat   Additional Comments: pt's caregiver, Kathlene November, can be with him nearly 24/7. He reports that at baseline, pt walks out of house 3x/ day and they go out in car 3x/ week. Ambulates with RW and had become more unsteady past several weeks.       Prior Functioning/Environment Level of Independence: Needs assistance  Gait / Transfers Assistance Needed: supervision with distance walking with RW, was ambulating within home independently ADL's / Homemaking Assistance Needed: Pt reports that Kathlene November assists him with donning socks         OT  Diagnosis: Generalized weakness;Cognitive deficits   OT Problem List: Decreased activity tolerance;Impaired balance (sitting and/or standing);Decreased cognition;Decreased safety awareness;Decreased knowledge of use of DME or AE;Obesity   OT Treatment/Interventions: Self-care/ADL training;DME and/or AE instruction;Patient/family education;Balance training;Therapeutic activities;Cognitive remediation/compensation    OT Goals(Current goals can be found in the care plan section) Acute Rehab OT Goals Patient Stated Goal: to go home  OT Goal Formulation: With patient Time For Goal Achievement: 08/25/14 Potential to Achieve Goals: Good ADL Goals Pt Will Perform Grooming: with min guard assist;standing Pt Will Perform Upper Body Bathing: with set-up;sitting Pt Will Perform Lower Body Bathing: with min guard assist;sit to/from stand Pt Will Perform Upper Body Dressing: with set-up;with supervision;sitting Pt Will Perform Lower Body Dressing: with min guard assist;sit to/from stand Pt Will Transfer to Toilet: with min guard assist;ambulating;bedside commode;regular height toilet;grab bars Pt Will Perform Toileting - Clothing Manipulation and hygiene: Independently;sit to/from stand  OT Frequency: Min 2X/week   Barriers to D/C:            Co-evaluation              End of Session Nurse Communication: Mobility status  Activity Tolerance: Patient tolerated treatment well Patient left: in chair;with call bell/phone within reach;with chair alarm set   Time: 6045-4098 OT Time Calculation (min): 27 min Charges:  OT General Charges $OT Visit: 1 Procedure OT Evaluation $Initial OT Evaluation Tier I: 1  Procedure OT Treatments $Self Care/Home Management : 8-22 mins G-Codes:    Jeani HawkingConarpe, Zanaya Baize M 08/11/2014, 2:33 PM

## 2014-08-11 NOTE — Progress Notes (Signed)
Patient attempted to pull RIJ Sleeve/Central Line out, RN was able to stop patient from removing line, new sterile dressing was applied using sterile technique, patient was educated on the importance of not pulling at lines.  Patient is confused, can be reoriented, but is still VERY impulsive, RN sitting at bedside now. Will monitor, bed alarm is on 

## 2014-08-11 NOTE — Progress Notes (Signed)
Subjective:  No complaints. No CP or dyspnea. States breathing is better. Some confusion reported by family.  Objective:  Vital Signs in the last 24 hours: Temp:  [97.4 F (36.3 C)-99.7 F (37.6 C)] 97.9 F (36.6 C) (11/04 1523) Pulse Rate:  [56-99] 73 (11/04 1600) Resp:  [15-30] 23 (11/04 1600) BP: (95-165)/(59-104) 114/66 mmHg (11/04 1600) SpO2:  [89 %-100 %] 99 % (11/04 1600) Arterial Line BP: (140-156)/(61-75) 140/61 mmHg (11/03 1900) Weight:  [203 lb 1.6 oz (92.126 kg)] 203 lb 1.6 oz (92.126 kg) (11/04 0500)  Intake/Output from previous day: 11/03 0701 - 11/04 0700 In: 2495.6 [P.O.:400; I.V.:1745.6; IV Piggyback:350] Out: 3160 [Urine:3060; Blood:100]  Physical Exam: Pt is alert and oriented, pleasant elderly male in NAD HEENT: normal Neck: JVP - normal Lungs: CTA bilaterally CV: RRR without murmur or gallop Abd: soft, NT, Positive BS, no hepatomegaly Ext: no C/C/E, distal pulses intact and equal Skin: warm/dry no rash Neuro: moves all extremities   Lab Results:  Recent Labs  08/10/14 1100 08/10/14 1105 08/11/14 0430  WBC 7.7  --  6.4  HGB 12.9* 12.6* 12.6*  PLT 95*  --  97*    Recent Labs  08/10/14 1011 08/10/14 1105 08/11/14 0430  NA 133* 140 139  K 3.7 4.0 3.8  CL 112  --  102  CO2  --   --  26  GLUCOSE 158* 140* 99  BUN 19  --  17  CREATININE 1.00  --  0.99   No results for input(s): TROPONINI in the last 72 hours.  Invalid input(s): CK, MB  Cardiac Studies: 2D Echo: Left ventricle: The cavity size was normal. There was mild focal basal hypertrophy of the septum. Systolic function was moderately to severely reduced. The estimated ejection fraction was in the range of 30% to 35%. Diffuse hypokinesis. Regional wall motion abnormalities:  There is hypokinesis of the inferior myocardium. Doppler parameters are consistent with abnormal left ventricular relaxation (grade 1 diastolic dysfunction). Doppler parameters are consistent with  high ventricular filling pressure.  ------------------------------------------------------------------- Aortic valve: Transcatheter Aortic Valve Replacement - Open Left Transfemoral Approach- Edwards Sapien XT (size 29 mm, model # 9300TFX, serial # [id number]) Doppler: Transvalvular velocity was within the normal range. There was no stenosis. There was no regurgitation.  Peak velocity ratio of LVOT to aortic valve: 0.71. Valve area (Vmax): 2.23 cm^2. Indexed valve area (Vmax): 1.05 cm^2/m^2. Mean velocity ratio of LVOT to aortic valve: 0.77. Valve area (Vmean): 2.42 cm^2. Indexed valve area (Vmean): 1.14 cm^2/m^2.  Mean gradient (S): 8 mm Hg. Peak gradient (S): 15 mm Hg.  ------------------------------------------------------------------- Aorta: Aortic root: The aortic root was normal in size.  ------------------------------------------------------------------- Mitral valve:  Calcified annulus. Mildly thickened leaflets . Mobility was not restricted. Doppler: Transvalvular velocity was within the normal range. There was no evidence for stenosis. There was mild regurgitation.  ------------------------------------------------------------------- Left atrium: The atrium was mildly dilated.  ------------------------------------------------------------------- Right ventricle: The cavity size was normal. Wall thickness was normal. Systolic function was normal.  ------------------------------------------------------------------- Pulmonic valve:  Structurally normal valve.  Cusp separation was normal. Doppler: Transvalvular velocity was within the normal range. There was no evidence for stenosis. There was mild regurgitation.  ------------------------------------------------------------------- Tricuspid valve:  Structurally normal valve.  Doppler: Transvalvular velocity was within the normal range. There was trivial  regurgitation.  ------------------------------------------------------------------- Pulmonary artery:  The main pulmonary artery was normal-sized. Systolic pressure was mildly increased.  ------------------------------------------------------------------- Right atrium: The atrium was normal in size.  ------------------------------------------------------------------- Pericardium: There  was no pericardial effusion.  ------------------------------------------------------------------- Systemic veins: Inferior vena cava: The vessel was normal in size. The respirophasic diameter changes were in the normal range (>= 50%), consistent with normal central venous pressure.  Tele: Sinus rhythm  Assessment/Plan:  1. Severe AS s/p TAVR POD #1 2. Acute on chronic systolic and diastolic CHF, improving. Continue with diuresis as tolerated. On losartan, has not been on beta-blockers secondary to hx of bradyarrhythmias. Continue aldactone, furosemide. 3. Thrombocytopenia, mild. Expected post-TAVR 4. 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 No anginal sx's.  Plan as outlined per Dr Cornelius Moraswen. Mobilize. Tx SDU when bed available. Continue to work with PT/OT.  Tonny BollmanMichael Lillien Petronio, M.D. 08/11/2014, 5:15 PM

## 2014-08-11 NOTE — Evaluation (Signed)
Physical Therapy Evaluation Patient Details Name: Eugene [name] MRN: [id number] DOB: 1923/12/14 Today's Date: 08/11/2014   History of Present Illness  Pt presents for TAVR with complex cardiac history. Lives with a roommate and has gait instability with h/o falls with and without syncope PTA.  Clinical Impression  Pt admitted with AMS and generalized weakness after TAVR. Pt currently with functional limitations due to the deficits listed below (see PT Problem List). Pt very impulsive and unsafe with mobility, requiring +2 mod assist for transfers and ambulation.  Pt will benefit from skilled PT to increase their independence and safety with mobility to allow discharge to the venue listed below. PT will continue to follow.       Follow Up Recommendations SNF;Supervision/Assistance - 24 hour    Equipment Recommendations  None recommended by PT    Recommendations for Other Services OT consult     Precautions / Restrictions Precautions Precautions: Fall Restrictions Weight Bearing Restrictions: No      Mobility  Bed Mobility Overal bed mobility: Needs Assistance;+2 for physical assistance Bed Mobility: Supine to Sit     Supine to sit: +2 for safety/equipment;Mod assist     General bed mobility comments: vc's for sequencing and pt confused by instructions, mod A to roll to left and elevate trunk from bed  Transfers Overall transfer level: Needs assistance Equipment used: Rolling walker (2 wheeled);2 person hand held assist Transfers: Sit to/from UGI CorporationStand;Stand Pivot Transfers Sit to Stand: +2 physical assistance;Mod assist Stand pivot transfers: +2 physical assistance;Mod assist       General transfer comment: sit to stand from bed to RW, pt knees unsteady and buckling partially, mod A +2 for power up. Did not use RW with SPT from toilet to chair, pt needed firm vc's for not sitting until at chair  Ambulation/Gait Ambulation/Gait assistance: Mod assist;+2 physical  assistance Ambulation Distance (Feet): 5 Feet Assistive device: Rolling walker (2 wheeled) Gait Pattern/deviations: Trunk flexed;Wide base of support;Step-through pattern;Decreased stride length Gait velocity: decreased   General Gait Details: pt unsafe with ambulation, pushing RW too far out ahead of himself and resisting therapist's attempts to pull it back towards him, difficulty with turning, quick fatigue  Stairs            Wheelchair Mobility    Modified Rankin (Stroke Patients Only)       Balance Overall balance assessment: Needs assistance Sitting-balance support: Bilateral upper extremity supported;Feet supported Sitting balance-Leahy Scale: Poor Sitting balance - Comments: needs UE support to maintain sitting, partially due to lethargy   Standing balance support: Bilateral upper extremity supported;During functional activity Standing balance-Leahy Scale: Poor Standing balance comment: requires UE support  and mod A to maintain balance                             Pertinent Vitals/Pain Pain Assessment: Faces Faces Pain Scale: Hurts little more Pain Location: left groin Pain Intervention(s): Monitored during session  VSS    Home Living Family/patient expects to be discharged to:: Private residence Living Arrangements: Non-relatives/Friends Available Help at Discharge: Friend(s);Available PRN/intermittently Type of Home: House Home Access: Stairs to enter Entrance Stairs-Rails: Can reach both Entrance Stairs-Number of Steps: 3 Home Layout: Two level Home Equipment: Walker - 2 wheels;Shower seat Additional Comments: pt's caregiver, Kathlene NovemberMike, can be with him nearly 24/7. He reports that at baseline, pt walks out of house 3x/ day and they go out in car 3x/ week. Ambulates with RW and  had become more unsteady past several weeks.     Prior Function Level of Independence: Needs assistance   Gait / Transfers Assistance Needed: supervision with distance  walking with RW, was ambulating within home independently  ADL's / Homemaking Assistance Needed: supervision        Hand Dominance   Dominant Hand: Right    Extremity/Trunk Assessment   Upper Extremity Assessment: Generalized weakness           Lower Extremity Assessment: Generalized weakness;RLE deficits/detail;LLE deficits/detail RLE Deficits / Details: knees buckling in standing L>R LLE Deficits / Details: see RLE note  Cervical / Trunk Assessment: Kyphotic  Communication   Communication: HOH  Cognition Arousal/Alertness: Lethargic Behavior During Therapy: Impulsive Overall Cognitive Status: Impaired/Different from baseline Area of Impairment: Following commands;Memory;Orientation;Attention;Safety/judgement;Problem solving Orientation Level: Disoriented to;Place;Time;Situation Current Attention Level: Sustained Memory: Decreased short-term memory;Decreased recall of precautions Following Commands: Follows one step commands inconsistently Safety/Judgement: Decreased awareness of safety;Decreased awareness of deficits   Problem Solving: Slow processing General Comments: pt thinks he is in the dentist office. He was lethargic throughout session though still impulsive with ambulation and not following directional commands well. Trying to sit before being at toilet, not staying within RW, etc.    General Comments      Exercises        Assessment/Plan    PT Assessment Patient needs continued PT services  PT Diagnosis Difficulty walking;Abnormality of gait;Generalized weakness;Acute pain;Altered mental status   PT Problem List Decreased strength;Decreased range of motion;Decreased activity tolerance;Decreased balance;Decreased mobility;Decreased coordination;Decreased cognition;Decreased knowledge of use of DME;Decreased safety awareness;Decreased knowledge of precautions;Pain;Cardiopulmonary status limiting activity  PT Treatment Interventions DME instruction;Gait  training;Functional mobility training;Therapeutic activities;Therapeutic exercise;Balance training;Neuromuscular re-education;Stair training;Cognitive remediation;Patient/family education   PT Goals (Current goals can be found in the Care Plan section) Acute Rehab PT Goals Patient Stated Goal: none stated PT Goal Formulation: With patient Time For Goal Achievement: 08/25/14 Potential to Achieve Goals: Good    Frequency Min 2X/week   Barriers to discharge        Co-evaluation               End of Session Equipment Utilized During Treatment: Gait belt Activity Tolerance: Patient limited by lethargy Patient left: in chair;with chair alarm set;with call bell/phone within reach;with family/visitor present Nurse Communication: Mobility status (need for Stedy)         Time: [id number]: 1112-1143 PT Time Calculation (min): 31 min   Charges:   PT Evaluation $Initial PT Evaluation Tier I: 1 Procedure PT Treatments $Gait Training: 8-22 mins $Therapeutic Activity: 8-22 mins   PT G Codes:        Lyanne CoVictoria Ariyan Sinnett, PT  Acute Rehab Services  [phone]   Lyanne CoManess, Krysteena Stalker 08/11/2014, 12:19 PM

## 2014-08-12 ENCOUNTER — Inpatient Hospital Stay (HOSPITAL_COMMUNITY): Payer: Medicare Other

## 2014-08-12 DIAGNOSIS — I2510 Atherosclerotic heart disease of native coronary artery without angina pectoris: Secondary | ICD-10-CM

## 2014-08-12 DIAGNOSIS — Z954 Presence of other heart-valve replacement: Secondary | ICD-10-CM

## 2014-08-12 DIAGNOSIS — I10 Essential (primary) hypertension: Secondary | ICD-10-CM

## 2014-08-12 LAB — BASIC METABOLIC PANEL
Anion gap: 11 (ref 5–15)
BUN: 23 mg/dL (ref 6–23)
CO2: 27 mEq/L (ref 19–32)
CREATININE: 1.02 mg/dL (ref 0.50–1.35)
Calcium: 9.3 mg/dL (ref 8.4–10.5)
Chloride: 104 mEq/L (ref 96–112)
GFR calc Af Amer: 74 mL/min — ABNORMAL LOW (ref >90)
GFR, EST NON AFRICAN AMERICAN: 63 mL/min — AB (ref >90)
GLUCOSE: 105 mg/dL — AB (ref 70–99)
POTASSIUM: 4.1 meq/L (ref 3.7–5.3)
Sodium: 142 mEq/L (ref 137–147)

## 2014-08-12 LAB — CBC
HCT: 38.6 % — ABNORMAL LOW (ref 39.0–52.0)
HEMOGLOBIN: 12.8 g/dL — AB (ref 13.0–17.0)
MCH: 31.4 pg (ref 26.0–34.0)
MCHC: 33.2 g/dL (ref 30.0–36.0)
MCV: 94.8 fL (ref 78.0–100.0)
Platelets: 105 10*3/uL — ABNORMAL LOW (ref 150–400)
RBC: 4.07 MIL/uL — ABNORMAL LOW (ref 4.22–5.81)
RDW: 14.2 % (ref 11.5–15.5)
WBC: 6 10*3/uL (ref 4.0–10.5)

## 2014-08-12 LAB — GLUCOSE, CAPILLARY
GLUCOSE-CAPILLARY: 106 mg/dL — AB (ref 70–99)
GLUCOSE-CAPILLARY: 107 mg/dL — AB (ref 70–99)
GLUCOSE-CAPILLARY: 112 mg/dL — AB (ref 70–99)
Glucose-Capillary: 113 mg/dL — ABNORMAL HIGH (ref 70–99)

## 2014-08-12 MED ORDER — INFLUENZA VAC SPLIT QUAD 0.5 ML IM SUSY: 0.5000 mL | PREFILLED_SYRINGE | INTRAMUSCULAR | Status: DC | PRN

## 2014-08-12 NOTE — Progress Notes (Signed)
Physical Therapy Treatment Patient Details Name: Eugene [name] N Cure MRN: [id number] DOB: [birth year] Today's Date: 08/12/2014    History of Present Illness Pt presents for TAVR with complex cardiac history. Lives with a roommate and has gait instability with h/o falls with and without syncope PTA.    PT Comments    Pt with greatly increased mobility from evaluation with cues and assist needed for all transfers and mobility. Given that pt has caregiver at home and has progressed since yesterday would recommend CIR to return pt to supervision level for home. Pt educated for safety with mobility and encouraged to continue HEP. Will follow.   Follow Up Recommendations  CIR;Supervision for mobility/OOB     Equipment Recommendations  None recommended by PT    Recommendations for Other Services       Precautions / Restrictions Precautions Precautions: Fall Restrictions Weight Bearing Restrictions: No    Mobility  Bed Mobility               General bed mobility comments: pt in chair on arrival  Transfers Overall transfer level: Needs assistance Equipment used: Rolling walker (2 wheeled) Transfers: Sit to/from Stand Sit to Stand: Min assist         General transfer comment: cues for hand placement, safety and controlled descent x 2 trials  Ambulation/Gait Ambulation/Gait assistance: Min assist;+2 safety/equipment Ambulation Distance (Feet): 150 Feet (25', then 150') Assistive device: Rolling walker (2 wheeled) Gait Pattern/deviations: Step-through pattern;Decreased stride length;Shuffle;Wide base of support;Trunk flexed   Gait velocity interpretation: Below normal speed for age/gender General Gait Details: cues for posture, posiition in RW and safety, pt with tendency for increased flexion and RW too anterior. Pt with seated rest for urinal prior to second gait trial   Stairs            Wheelchair Mobility    Modified Rankin (Stroke Patients Only)        Balance Overall balance assessment: Needs assistance   Sitting balance-Leahy Scale: Fair       Standing balance-Leahy Scale: Poor                      Cognition Arousal/Alertness: Awake/alert Behavior During Therapy: Flat affect Overall Cognitive Status: Impaired/Different from baseline Area of Impairment: Safety/judgement         Safety/Judgement: Decreased awareness of safety;Decreased awareness of deficits   Problem Solving: Difficulty sequencing;Requires verbal cues;Requires tactile cues General Comments: Pt oriented today, aware of surgery but decreased safety and mobility    Exercises General Exercises - Lower Extremity Long Arc Quad: AROM;Both;20 reps;Seated Hip ABduction/ADduction: AROM;Seated;Both;20 reps Hip Flexion/Marching: AROM;Seated;Both;20 reps Toe Raises: AROM;Seated;Both;20 reps Heel Raises: AROM;Seated;Both;20 reps    General Comments        Pertinent Vitals/Pain Pain Assessment: No/denies pain  HR 88-103 with activity sats 96% on Ra 162/88    Home Living                      Prior Function            PT Goals (current goals can now be found in the care plan section) Progress towards PT goals: Progressing toward goals    Frequency  Min 3X/week    PT Plan Discharge plan needs to be updated    Co-evaluation             End of Session Equipment Utilized During Treatment: Gait belt Activity Tolerance: Patient tolerated treatment well Patient left: in chair;with  call bell/phone within reach     Time: 0837-0900 PT Time Calculation (min): 23 min  Charges:  $Gait Training: 8-22 mins $Therapeutic Exercise: 8-22 mins                    G Codes:      Delorse Lekabor, Adrionna Delcid Beth 08/12/2014, 11:16 AM Delaney MeigsMaija Tabor Chioke Noxon, PT [phone]

## 2014-08-12 NOTE — Progress Notes (Signed)
SUBJECTIVE: No complaints this am. No chest pain. Breathing is better.   BP 152/76 mmHg  Pulse 79  Temp(Src) 97.6 F (36.4 C) (Oral)  Resp 20  Ht 5\' 7"  (1.702 m)  Wt 201 lb 1 oz (91.2 kg)  BMI 31.48 kg/m2  SpO2 97%  Tele: sinus, 1st degree AV block, PVCs  Intake/Output Summary (Last 24 hours) at 08/12/14 [id number] Last data filed at 08/12/14 0500  Gross per 24 hour  Intake   1780 ml  Output   2700 ml  Net   -920 ml    PHYSICAL EXAM General: Well developed, well nourished, in no acute distress. Alert and oriented x 3.  Psych:  Good affect, responds appropriately Neck: No JVD. No masses noted.  Lungs: Clear bilaterally with no wheezes or rhonci noted.  Heart: RRR with no murmurs noted. Abdomen: Bowel sounds are present. Soft, non-tender.  Extremities: No lower extremity edema.   LABS: Basic Metabolic Panel:  Recent Labs  46/96/2910/01/20 0430 08/12/14 0300  NA 139 142  K 3.8 4.1  CL 102 104  CO2 26 27  GLUCOSE 99 105*  BUN 17 23  CREATININE 0.99 1.02  CALCIUM 8.9 9.3  MG 1.8  --    CBC:  Recent Labs  08/11/14 0430 08/12/14 0300  WBC 6.4 6.0  HGB 12.6* 12.8*  HCT 37.8* 38.6*  MCV 93.8 94.8  PLT 97* 105*   Current Meds: . acetaminophen  1,000 mg Oral 4 times per day  . amiodarone  200 mg Oral Daily  . aspirin EC  81 mg Oral Daily  . clopidogrel  75 mg Oral Q breakfast  . furosemide  20 mg Oral q morning - 10a  . levothyroxine  25 mcg Oral QAC breakfast  . losartan  50 mg Oral Daily  . mupirocin ointment  1 application Nasal BID  . oxybutynin  5 mg Oral BID  . pantoprazole  40 mg Oral Daily  . pravastatin  40 mg Oral q1800  . sodium chloride  3 mL Intravenous Q12H  . sodium chloride  3 mL Intravenous Q12H  . spironolactone  12.5 mg Oral QODAY  . terazosin  1 mg Oral QHS  . vitamin B-12  1,000 mcg Oral Daily   Echo 08/11/14: Left ventricle: The cavity size was normal. There was mild focal basal hypertrophy of the septum. Systolic function was  moderately to severely reduced. The estimated ejection fraction was in the range of 30% to 35%. Diffuse hypokinesis. There is hypokinesis of the inferior myocardium. Doppler parameters are consistent with abnormal left ventricular relaxation (grade 1 diastolic dysfunction). Doppler parameters are consistent with high ventricular filling pressure. - Aortic valve: Mean gradient (S): 8 mm Hg. Valve area (Vmax): 2.23 cm^2. Valve area (Vmean): 2.42 cm^2. - Mitral valve: Calcified annulus. Mildly thickened leaflets . There was mild regurgitation. - Left atrium: The atrium was mildly dilated. - Pulmonary arteries: Systolic pressure was mildly increased. PA peak pressure: 35 mm Hg (S).  ASSESSMENT AND PLAN:  1. Severe AS: s/p TAVR POD #2. Stable. Follow up echo shows normally functioning bioprosthetic aortic valve. Continue ASA and Plavix.  Continue PT/OT.   2. Acute on chronic systolic and diastolic CHF: -900 cc last 24 hours. Continue po Lasix. On losartan, has not been on beta-blockers secondary to hx of bradyarrhythmias. Continue aldactone and Lasix.   3. CAD: Stable. Old inferior MI.   4. History of Atrial tach: Sinus with PVCs this am. On amiodarone.  Orders in place for transfer to 2W. Pt is waiting on a bed.     Dvon Jiles  11/5/20157:11 AM

## 2014-08-12 NOTE — Progress Notes (Signed)
Rehab Admissions Coordinator Note:  Patient was screened by Verena Shawgo L for appropriateness for an Inpatient Acute Rehab Consult.  At this time, we are recommending Inpatient Rehab consult.  Venna Berberich, PT Rehabilitation Admissions Coordinator [phone]  

## 2014-08-12 NOTE — Progress Notes (Addendum)
[address]       [place], [id number]             [phone]        CARDIOTHORACIC SURGERY PROGRESS NOTE   R2 Days Post-Op Procedure(s) (LRB): TRANSCATHETER AORTIC VALVE REPLACEMENT, TRANSFEMORAL ([name]/A) TRANSESOPHAGEAL ECHOCARDIOGRAM (TEE) ([name]/A)  Subjective: Patient with no complaints this morning, and no events overnight. Progressing well.  Objective: Vital signs: BP Readings from Last 1 Encounters:  08/12/14 158/86   Pulse Readings from Last 1 Encounters:  08/12/14 39   Resp Readings from Last 1 Encounters:  08/12/14 14   Temp Readings from Last 1 Encounters:  08/12/14 98.3 F (36.8 C) Oral    Hemodynamics:    Physical Exam:  Rhythm:   NSR with PVCs  Breath sounds: Clear to auscultation bilaterally  Heart sounds:  Normal S1 and S2, no murmurs, rubs or gallops  Incisions:  Appropriately dressed.  Abdomen:  Soft, non tender, non distended  Extremities:  Warm, well perfused, no edema   Intake/Output from previous day: 11/04 0701 - 11/05 0700 In: 1780 [P.O.:1680; IV Piggyback:100] Out: 2700 [Urine:2700] Intake/Output this shift:    Lab Results:  CBC: Recent Labs  08/11/14 0430 08/12/14 0300  WBC 6.4 6.0  HGB 12.6* 12.8*  HCT 37.8* 38.6*  PLT 97* 105*    BMET:  Recent Labs  08/11/14 0430 08/12/14 0300  NA 139 142  K 3.8 4.1  CL 102 104  CO2 26 27  GLUCOSE 99 105*  BUN 17 23  CREATININE 0.99 1.02  CALCIUM 8.9 9.3     CBG (last 3)   Recent Labs  08/11/14 1910 08/11/14 2334 08/12/14 0420  GLUCAP 123* 107* 106*    ABG    Component Value Date/Time   PHART 7.346* 08/10/2014 1110   PCO2ART 44.3 08/10/2014 1110   PO2ART 85.0 08/10/2014 1110   HCO3 24.3* 08/10/2014 1110   TCO2 26 08/10/2014 1110   ACIDBASEDEF 2.0 08/10/2014 1110   O2SAT 96.0 08/10/2014 1110    CXR:  EXAM: PORTABLE CHEST - 1 VIEW  COMPARISON: Portable chest x-ray of August 10, 2014  FINDINGS: The lungs are adequately inflated.  The pulmonary interstitial markings have markedly improved bilaterally. The left hemidiaphragm is slightly less well demonstrated today however. The cardiopericardial silhouette remains enlarged. The pulmonary vascularity is more distinct today. The right internal jugular Cordis sheath tip projects over the proximal portion of the SVC. The Swan-Ganz catheter has been removed. There is chronic deformity of the right shoulder and proximal humeral shaft.  IMPRESSION: Improved pulmonary edema consistent with resolving CHF.   Electronically Signed  By: Eugene [name]  On: 08/11/2014 08:18  Assessment/Plan: S/P Procedure(s) (LRB): TRANSCATHETER AORTIC VALVE REPLACEMENT, TRANSFEMORAL ([name]/A) TRANSESOPHAGEAL ECHOCARDIOGRAM (TEE) ([name]/A)  Patient progressing very well, Post Op day 2.  Continue Lasix PO. CXR from today pending at this time. Will be transferred to 2W when bed becomes available.   Eugene [name], Eugene [name] 08/12/2014 7:41 AM  Transthoracic Echocardiography  Patient:  Eugene [name], Eugene [name] MR #:    [id number] Study Date: 08/11/2014 Gender:   M Age:    80 Height:   170.2 cm Weight:   92.3 kg BSA:    2.12 m^2 Pt. Status: Room:    2S14C  ADMITTING  Eugene [name] ATTENDING  Eugene [name] ORDERING   Eugene [name] REFERRING  Eugene [name] PERFORMING  [facility], Inpatient SONOGRAPHER Leta Junglingiffany [name], RDCS  cc:  ------------------------------------------------------------------- LV EF: 30% -  35%  -------------------------------------------------------------------  Indications:   CVA 436.  ------------------------------------------------------------------- History:  PMH: S/P TAVR. Bradycardia. Supraventricular Tachycardia. Altered mental status. Coronary artery disease.  ------------------------------------------------------------------- Study Conclusions  - Left ventricle: The cavity size was normal. There was mild focal basal  hypertrophy of the septum. Systolic function was moderately to severely reduced. The estimated ejection fraction was in the range of 30% to 35%. Diffuse hypokinesis. There is hypokinesis of the inferior myocardium. Doppler parameters are consistent with abnormal left ventricular relaxation (grade 1 diastolic dysfunction). Doppler parameters are consistent with high ventricular filling pressure. - Aortic valve: Mean gradient (S): 8 mm Hg. Valve area (Vmax): 2.23 cm^2. Valve area (Vmean): 2.42 cm^2. - Mitral valve: Calcified annulus. Mildly thickened leaflets . There was mild regurgitation. - Left atrium: The atrium was mildly dilated. - Pulmonary arteries: Systolic pressure was mildly increased. PA peak pressure: 35 mm Hg (S).  Transthoracic echocardiography. M-mode, complete 2D, spectral Doppler, and color Doppler. Birthdate: Patient birthdate: 1923-11-10. Age: Patient is 80 yr old. Sex: Gender: male. BMI: 31.9 kg/m^2. Blood pressure:   128/104 Patient status: Inpatient. Study date: Study date: 08/11/2014. Study time: 11:55 AM. Location: Bedside.  -------------------------------------------------------------------  ------------------------------------------------------------------- Left ventricle: The cavity size was normal. There was mild focal basal hypertrophy of the septum. Systolic function was moderately to severely reduced. The estimated ejection fraction was in the range of 30% to 35%. Diffuse hypokinesis. Regional wall motion abnormalities:  There is hypokinesis of the inferior myocardium. Doppler parameters are consistent with abnormal left ventricular relaxation (grade 1 diastolic dysfunction). Doppler parameters are consistent with high ventricular filling pressure.  ------------------------------------------------------------------- Aortic valve: Transcatheter Aortic Valve Replacement - Open Left Transfemoral Approach- Edwards  Sapien XT (size 29 mm, model # 9300TFX, serial # [id number]) Doppler: Transvalvular velocity was within the normal range. There was no stenosis. There was no regurgitation.  Peak velocity ratio of LVOT to aortic valve: 0.71. Valve area (Vmax): 2.23 cm^2. Indexed valve area (Vmax): 1.05 cm^2/m^2. Mean velocity ratio of LVOT to aortic valve: 0.77. Valve area (Vmean): 2.42 cm^2. Indexed valve area (Vmean): 1.14 cm^2/m^2.  Mean gradient (S): 8 mm Hg. Peak gradient (S): 15 mm Hg.  ------------------------------------------------------------------- Aorta: Aortic root: The aortic root was normal in size.  ------------------------------------------------------------------- Mitral valve:  Calcified annulus. Mildly thickened leaflets . Mobility was not restricted. Doppler: Transvalvular velocity was within the normal range. There was no evidence for stenosis. There was mild regurgitation.  ------------------------------------------------------------------- Left atrium: The atrium was mildly dilated.  ------------------------------------------------------------------- Right ventricle: The cavity size was normal. Wall thickness was normal. Systolic function was normal.  ------------------------------------------------------------------- Pulmonic valve:  Structurally normal valve.  Cusp separation was normal. Doppler: Transvalvular velocity was within the normal range. There was no evidence for stenosis. There was mild regurgitation.  ------------------------------------------------------------------- Tricuspid valve:  Structurally normal valve.  Doppler: Transvalvular velocity was within the normal range. There was trivial regurgitation.  ------------------------------------------------------------------- Pulmonary artery:  The main pulmonary artery was normal-sized. Systolic pressure was mildly  increased.  ------------------------------------------------------------------- Right atrium: The atrium was normal in size.  ------------------------------------------------------------------- Pericardium: There was no pericardial effusion.  ------------------------------------------------------------------- Systemic veins: Inferior vena cava: The vessel was normal in size. The respirophasic diameter changes were in the normal range (>= 50%), consistent with normal central venous pressure.  ------------------------------------------------------------------- Measurements  Left ventricle              Value     Reference LV ID, ED, PLAX          (L)   5.3  mm  35 - 60 LV ID, ES, PLAX          (L)   4.8  mm    21 - 40 LV fx shortening, PLAX      (L)   9   %    25 - 46 LV mid-wall fx shortening, PLAX      5   %    --------- LV PW thickness, ED, PLAX         0.7  mm    --------- IVS/LV PW ratio, ED, PLAX     (H)   2.43      <=1.3 LV e&', lateral              6.64 cm/s   --------- LV E/e&', lateral             10.38     --------- LV e&', medial               8.83 cm/s   --------- LV E/e&', medial              7.8      --------- LV e&', average              7.74 cm/s   --------- LV E/e&', average             8.91      ---------  Ventricular septum            Value     Reference IVS thickness, ED, PLAX          1.7  mm    ---------  LVOT                   Value     Reference LVOT ID, S                20  mm    --------- LVOT area                 3.14 cm^2   --------- LVOT peak velocity, S           139  cm/s   --------- LVOT mean velocity, S            101  cm/s   --------- LVOT peak gradient, S           8   mm Hg  --------- LVOT mean gradient, S           5   mm Hg  ---------  Aortic valve               Value     Reference Aortic valve peak velocity, S       196  cm/s   --------- Aortic valve mean velocity, S       131  cm/s   --------- Aortic valve VTI, S            28.1 cm    --------- Aortic mean gradient, S          8   mm Hg  --------- Aortic peak gradient, S          15  mm Hg  --------- Velocity ratio, peak, LVOT/AV       0.71      --------- Aortic valve area, peak velocity     2.23 cm^2   --------- Aortic valve area/bsa, peak        1.05 cm^2/m^2 --------- velocity Velocity ratio, mean,  LVOT/AV       0.77      --------- Aortic valve area, mean velocity     2.42 cm^2   --------- Aortic valve area/bsa, mean        1.14 cm^2/m^2 --------- velocity  Aorta                   Value     Reference Aortic root ID              20  mm    ---------  Left atrium                Value     Reference LA ID, A-P, ES              43  mm    --------- LA ID/bsa, A-P              2.03 cm/m^2  <=2.2 LA ID, A-P, ED, PLAX           43  mm    --------- LA volume/bsa, S             30.2 ml/m^2  --------- LA volume, ES, 1-p A4C          36.7 ml    --------- LA volume/bsa, ES, 1-p A4C        17.3 ml/m^2  --------- LA volume, ES, 1-p A2C          61.3 ml    --------- LA volume/bsa, ES, 1-p A2C        28.9 ml/m^2  ---------  Mitral valve               Value     Reference Mitral E-wave peak velocity        68.9 cm/s   --------- Mitral A-wave peak  velocity        87  cm/s   --------- Mitral deceleration slope         452  cm/s^2  --------- Mitral deceleration time     (L)   45  ms    150 - 230 Mitral E/A ratio, peak          0.79      ---------  Pulmonary arteries            Value     Reference PA pressure, S, DP        (H)   35  mm Hg  <=30  Tricuspid valve              Value     Reference Tricuspid regurg peak velocity      285  cm/s   --------- Tricuspid peak RV-RA gradient       32  mm Hg  ---------  Systemic veins              Value     Reference Estimated CVP               3   mm Hg  ---------  Right ventricle              Value     Reference RV pressure, S, DP        (H)   35  mm Hg  <=30 RV s&', lateral, S             8.7  cm/s   ---------  Legend: (L) and (H) mark values outside specified reference range.  -------------------------------------------------------------------  Prepared and Electronically Authenticated by  Donato Schultz, M.D. 2015-11-04T14:58:58   I have seen and examined the patient and agree with the assessment and plan as outlined.  Looks very good.  Post op ECHO looks good w/ no paravalvular leak.  Some intermittent Afib w/ reasonable rate control on amiodarone.  Could consider adding low dose beta blocker but some h/o bradycardia in past on beta-blockers.  Patient has had GI bleeding in the past and remains on dual anti-platelet therapy at this time.  Will defer any change in Rx to Dr [name]/Dr Clifton James.  Potentially ready for d/c soon once adequate arrangements in place.    I spent in excess of 15 minutes during the conduct of this hospital encounter and >50% of this time involved direct face-to-face encounter with the patient for counseling and/or coordination of their  care.   Quanisha Drewry H 08/12/2014 7:46 AM

## 2014-08-13 ENCOUNTER — Other Ambulatory Visit: Payer: Self-pay | Admitting: Cardiology

## 2014-08-13 ENCOUNTER — Encounter (HOSPITAL_COMMUNITY): Payer: Self-pay | Admitting: Family Medicine

## 2014-08-13 ENCOUNTER — Telehealth: Payer: Self-pay | Admitting: Physician Assistant

## 2014-08-13 DIAGNOSIS — D6959 Other secondary thrombocytopenia: Secondary | ICD-10-CM | POA: Diagnosis present

## 2014-08-13 DIAGNOSIS — R29898 Other symptoms and signs involving the musculoskeletal system: Secondary | ICD-10-CM

## 2014-08-13 MED ORDER — DOCUSATE SODIUM 100 MG PO CAPS
100.0000 mg | ORAL_CAPSULE | Freq: Two times a day (BID) | ORAL | Status: DC | PRN
  Administered 2014-08-13 – 2014-08-14 (×2): 100 mg via ORAL
  Filled 2014-08-13: qty 1

## 2014-08-13 MED ORDER — SENNA 8.6 MG PO TABS
1.0000 | ORAL_TABLET | Freq: Every day | ORAL | Status: DC | PRN
  Administered 2014-08-13 – 2014-08-14 (×2): 8.6 mg via ORAL
  Filled 2014-08-13 (×4): qty 1

## 2014-08-13 MED ORDER — MUPIROCIN 2 % EX OINT: 1.0000 "application " | TOPICAL_OINTMENT | Freq: Two times a day (BID) | CUTANEOUS | Status: DC

## 2014-08-13 MED ORDER — DSS 100 MG PO CAPS: 100.0000 mg | ORAL_CAPSULE | Freq: Two times a day (BID) | ORAL | Status: DC | PRN

## 2014-08-13 MED ORDER — ACETAMINOPHEN 500 MG PO TABS: 500.0000 mg | ORAL_TABLET | Freq: Four times a day (QID) | ORAL | Status: DC | PRN

## 2014-08-13 MED ORDER — METOPROLOL TARTRATE 25 MG PO TABS: 12.5000 mg | ORAL_TABLET | Freq: Two times a day (BID) | ORAL | Status: DC

## 2014-08-13 MED ORDER — TRAMADOL HCL 50 MG PO TABS: 50.0000 mg | ORAL_TABLET | Freq: Two times a day (BID) | ORAL | Status: DC | PRN

## 2014-08-13 MED ORDER — METOPROLOL TARTRATE 12.5 MG HALF TABLET
12.5000 mg | ORAL_TABLET | Freq: Two times a day (BID) | ORAL | Status: DC
  Administered 2014-08-13 – 2014-08-14 (×3): 12.5 mg via ORAL
  Filled 2014-08-13 (×4): qty 1

## 2014-08-13 NOTE — Progress Notes (Signed)
Note to weekend PAs  - if patient is not discharged today, please see discharge summary that was finished today. We are just awaiting authorization for CIR bed. Please addend if necessary if any med changes are made in the interim. Mikhaela Zaugg PA-C

## 2014-08-13 NOTE — Consult Note (Signed)
Physical Medicine and Rehabilitation Consult Reason for Consult:Debilitation after TAVR procedure Referring Physician: Dr. [name]   HPI: Barrie Dunkerfird N Shaub is a 80 y.o. male with history of coronary artery disease and aortic stenosis. Patient with history of syncope as well as myocardial infarction he had been treated with angioplasty.  Patient lives with a roommate and used a walker prior to admission.Presented 08/10/2014 with findings of worsening aortic stenosis.Patient used a cane walker prior to admission with increasing unsteady gait over the past several weeks. He does have a caregiver at home. Underwent transcatheter aortic valve replacement 08/10/2014 per Dr. Cornelius Moraswen.  A follow-up echocardiogram completed postprocedure showing ejection fraction 35% hypokinesis of the inferior myocardium grade 1 diastolic dysfunction with no paravalvular leak . Some intermittent bouts of atrial fibrillation with control on amiodarone. Physical and occupational therapy evaluations completed an ongoing noting patient to be somewhat impulsive and unsafe and recommendations of physical medicine rehabilitation consult.   Review of Systems  Cardiovascular: Positive for palpitations and leg swelling.  Gastrointestinal: Positive for constipation.  Musculoskeletal: Positive for myalgias and falls.  Neurological: Positive for weakness.       Syncope  All other systems reviewed and are negative.  Past Medical History  Diagnosis Date  . Glaucoma     Right eye  . Diverticulosis     multiple hospital admissions for GI bleed, has an episode roughly every 6 months,, last  colonscopy in 2006 showed diverticulosis, was evaluated by Dr. Madilyn FiremanHayes in the hospital in 2011 for lower GI bleed andd it  was suggested that the patient was not actively bleeding at that time and given his comorbid conditions colonoscopy was deferred. He is also chronically constipated  . Macular degeneration of right eye   . Allergic rhinitis   .  CAD (coronary artery disease)     s/p PCI in 1991;  Cardiac catheterization 11/02/11: Ostial LAD 95% with aneurysmal dilatation after this, proximal circumflex 70% with a superior branch 30-40%, proximal RCA 75% and 90%, mid RCA 50% and 90% before a PDA, then occluded.;    high risk for CABG;PCI 11/14/11: Bare-metal stent to the proximal LAD    . Hypertension   . Hyperlipidemia   . Chronic systolic heart failure     Echocardiogram 11/02/11: Moderate LVH, EF 30-35%, multiple wall motion abnormalities, critical aortic stenosis, AVA 0.6, mean gradient 45, mild MR, severe LAE  . Supraventricular tachycardia     2 syndromes-nonsustained atrial tachycardia//adenosine responsive diuretic positive reentry probably AV node reentry  . Aortic stenosis     s/p AV balloon valvuloplasty by Dr. [name] 11/2011  . Ischemic cardiomyopathy   . H/O: GI bleed     recurrent  . Heart murmur   . CHF (congestive heart failure)   . Pneumonia 2012  . Hypothyroidism   . GERD (gastroesophageal reflux disease)   . Falls frequently     3 times in the past week/notes 09/11/2013  . Myocardial infarction 1991; 10/28/2011  . Exertional shortness of breath     "not much" (09/11/2013)  . Arthritis     "might have slight; have trouble w/my fingers" (09/11/2013)  . Atrial fibrillation   . Peripheral vascular disease   . OSA on CPAP     since 89  . Limited mobility   . 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 Glaucoma surgery      Implantation of Baerveldt glaucoma device lant with scleral reinforcement using tutoplast tissue graft right eye. [Other]  . Coronary angioplasty with stent placement  Jan. 23, 2013  . Corneal transplant Right   . Angioplasty Left 03/30/2013    tibial  . Tonsillectomy  1946  . Cataract extraction w/ intraocular lens  implant,  bilateral Bilateral   . Cardiac valve replacement  11/30/2011    aortic valve.?  . Transcatheter aortic valve replacement, transfemoral N/A 08/10/2014    Procedure: TRANSCATHETER AORTIC VALVE REPLACEMENT, TRANSFEMORAL;  Surgeon: Tonny Bollman, MD;  Location: [facility] OR;  Service: Open Heart Surgery;  Laterality: N/A;  . Tee without cardioversion N/A 08/10/2014    Procedure: TRANSESOPHAGEAL ECHOCARDIOGRAM (TEE);  Surgeon: Tonny Bollman, MD;  Location: [facility] OR;  Service: Open Heart Surgery;  Laterality: N/A;   Family History  Problem Relation Age of Onset  . Cancer Mother     Ovarian Cancer  . Cancer Father     Lung  . Heart disease Sister     Heart disease before age 13   Social History:  reports that he quit smoking about 55 years ago. His smoking use included Cigarettes. He has a 63 pack-year smoking history. He has never used smokeless tobacco. He reports that he does not drink alcohol or use illicit drugs. Allergies:  Allergies  Allergen Reactions  . Penicillins Swelling and Other (See Comments)    REACTION: "turns red"  . Corn-Containing Products Other (See Comments)    Trouble digesting   Medications Prior to Admission  Medication Sig Dispense Refill  . amiodarone (PACERONE) 200 MG tablet Take 200 mg by mouth daily.    Marland Kitchen aspirin EC 81 MG tablet Take 81 mg by mouth daily.    . Calcium Carb-Cholecalciferol (CALCIUM 600+D) 600-800 MG-UNIT TABS Take 1 tablet by mouth daily.    . ferrous sulfate 325 (65 FE) MG tablet Take 325 mg by mouth daily with breakfast.    . furosemide (LASIX) 20 MG tablet Take 1 tablet (20 mg total) by mouth every morning. 30 tablet 6  . levothyroxine (SYNTHROID, LEVOTHROID) 25 MCG tablet Take 25 mcg by mouth daily before breakfast.    . losartan (COZAAR) 50 MG tablet Take 50 mg by mouth daily.    . Multiple Vitamin (MULTIVITAMIN) tablet Take 1 tablet by mouth at bedtime.    Marland Kitchen oxybutynin (DITROPAN) 5 MG tablet Take 5 mg by mouth 2 (two) times daily.    .  pantoprazole (PROTONIX) 40 MG tablet Take 40 mg by mouth daily.    . pravastatin (PRAVACHOL) 40 MG tablet Take 1 tablet (40 mg total) by mouth daily. 90 tablet 4  . spironolactone (ALDACTONE) 25 MG tablet Take 12.5 mg by mouth every other day.     . terazosin (HYTRIN) 1 MG capsule Take 1 mg by mouth at bedtime.    . vitamin B-12 (CYANOCOBALAMIN) 1000 MCG tablet Take 1,000 mcg by mouth daily.      Home: Home Living Family/patient expects to be discharged to:: Private residence Living Arrangements: Non-relatives/Friends Available Help at Discharge: Friend(s), Available PRN/intermittently Type of Home: House Home Access: Stairs to enter Secretary/administrator of Steps: 3 Entrance Stairs-Rails: Can reach both Home Layout: Two level Alternate Level Stairs-Number of Steps: 5-6 Alternate Level Stairs-Rails: Right, Left Home Equipment: Walker - 2 wheels, Shower seat Additional Comments: pt's caregiver, Kathlene November, can be with him nearly 24/7. He reports that at baseline, pt walks  out of house 3x/ day and they go out in car 3x/ week. Ambulates with RW and had become more unsteady past several weeks.   Functional History: Prior Function Level of Independence: Needs assistance Gait / Transfers Assistance Needed: supervision with distance walking with RW, was ambulating within home independently ADL's / Homemaking Assistance Needed: Pt reports that Kathlene November assists him with donning socks  Functional Status:  Mobility: Bed Mobility Overal bed mobility: Needs Assistance, +2 for physical assistance Bed Mobility: Supine to Sit Supine to sit: +2 for safety/equipment, Mod assist General bed mobility comments: pt in chair on arrival Transfers Overall transfer level: Needs assistance Equipment used: Rolling walker (2 wheeled) Transfers: Sit to/from Stand Sit to Stand: Min assist Stand pivot transfers: Mod assist General transfer comment: cues for hand placement, safety and controlled descent x 2  trials Ambulation/Gait Ambulation/Gait assistance: Min assist, +2 safety/equipment Ambulation Distance (Feet): 150 Feet (25', then 150') Assistive device: Rolling walker (2 wheeled) Gait Pattern/deviations: Step-through pattern, Decreased stride length, Shuffle, Wide base of support, Trunk flexed Gait velocity: decreased Gait velocity interpretation: Below normal speed for age/gender General Gait Details: cues for posture, posiition in RW and safety, pt with tendency for increased flexion and RW too anterior. Pt with seated rest for urinal prior to second gait trial    ADL: ADL Overall ADL's : Needs assistance/impaired Eating/Feeding: Set up, Sitting Grooming: Oral care, Minimal assistance Grooming Details (indicate cue type and reason): Pt with difficulty sequencing task.   Upper Body Bathing: Minimal assitance, Sitting Lower Body Bathing: Moderate assistance, Sit to/from stand Upper Body Dressing : Moderate assistance, Sitting Lower Body Dressing: Moderate assistance, Sit to/from stand Lower Body Dressing Details (indicate cue type and reason): able to don/doff Rt sock; requires mod A for Lt Toilet Transfer: Moderate assistance, Stand-pivot, BSC Toileting- Clothing Manipulation and Hygiene: Moderate assistance, Sit to/from stand Functional mobility during ADLs: Moderate assistance General ADL Comments: Pt eager to discharge home.   Cognition: Cognition Overall Cognitive Status: Impaired/Different from baseline Orientation Level: Oriented X4 Cognition Arousal/Alertness: Awake/alert Behavior During Therapy: Flat affect Overall Cognitive Status: Impaired/Different from baseline Area of Impairment: Safety/judgement Orientation Level: Disoriented to, Place Current Attention Level: Selective Memory: Decreased short-term memory, Decreased recall of precautions Following Commands: Follows one step commands consistently Safety/Judgement: Decreased awareness of safety, Decreased  awareness of deficits Problem Solving: Difficulty sequencing, Requires verbal cues, Requires tactile cues General Comments: Pt oriented today, aware of surgery but decreased safety and mobility  Blood pressure 149/86, pulse 71, temperature 97.6 F (36.4 C), temperature source Oral, resp. rate 18, height 5\' 7"  (1.702 m), weight 91.2 kg (201 lb 1 oz), SpO2 97 %. Physical Exam  Nursing note reviewed. HENT:  Head: Normocephalic.  Eyes: EOM are normal.  Neck: Normal range of motion. Neck supple. No thyromegaly present.  Cardiovascular:  Cardiac rate controlled  Respiratory: Effort normal and breath sounds normal. No respiratory distress.  GI: Soft. Bowel sounds are normal. He exhibits no distension.  Neurological:  Patient is a bit hard of hearing but alert and oriented 3. He does follow simple commands. Strength 4/5 UE's. LE's: 3/5 HF, 4/5 KE, 4/5 ADF/APF.  No gross sensory abnl. FMC a bit diminished. Good insight and awareness  Skin: Skin is warm and dry.  Bruising about right neck.   Psychiatric: He has a normal mood and affect. His behavior is normal. Thought content normal.    Results for orders placed or performed during the hospital encounter of 08/10/14 (from the past 24 hour(s))  Glucose, capillary     Status: Abnormal   Collection Time: 08/12/14  7:17 AM  Result Value Ref Range   Glucose-Capillary 112 (H) 70 - 99 mg/dL   Comment 1 Capillary Sample    Comment 2 Notify RN    Dg Chest Port 1 View  08/12/2014   CLINICAL DATA:  Atelectasis.  EXAM: PORTABLE CHEST - 1 VIEW  COMPARISON:  08/11/2014  FINDINGS: Right jugular Cordis has been removed. Cardiac silhouette remains mildly enlarged. Sequelae of aortic valve replacement are again identified. Thoracic aorta calcification is again seen. The lungs are hypoinflated with mild central pulmonary vascular congestion and stable to minimally increased interstitial densities bilaterally. There is persistent retrocardiac opacity in the left  lower lobe. Small left pleural effusion not excluded. No pneumothorax is identified. Chronic deformity of the proximal right humerus is again partially visualized.  IMPRESSION: Hypoinflation with likely mild residual interstitial edema, slightly increased from prior.   Electronically Signed   By: Sebastian AcheAllen  Grady   On: 08/12/2014 08:05   Dg Chest Port 1 View  08/11/2014   CLINICAL DATA:  Status post aortic valve replacement  EXAM: PORTABLE CHEST - 1 VIEW  COMPARISON:  Portable chest x-ray of August 10, 2014  FINDINGS: The lungs are adequately inflated. The pulmonary interstitial markings have markedly improved bilaterally. The left hemidiaphragm is slightly less well demonstrated today however. The cardiopericardial silhouette remains enlarged. The pulmonary vascularity is more distinct today. The right internal jugular Cordis sheath tip projects over the proximal portion of the SVC. The Swan-Ganz catheter has been removed. There is chronic deformity of the right shoulder and proximal humeral shaft.  IMPRESSION: Improved pulmonary edema consistent with resolving CHF.   Electronically Signed   By: David  SwazilandJordan   On: 08/11/2014 08:18    Assessment/Plan: Diagnosis: debilitation related to AS, s/p TCAVR 1. Does the need for close, 24 hr/day medical supervision in concert with the patient's rehab needs make it unreasonable for this patient to be served in a less intensive setting? Yes 2. Co-Morbidities requiring supervision/potential complications: htn, cad, AS 3. Due to bladder management, bowel management, safety, skin/wound care, disease management, medication administration, pain management and patient education, does the patient require 24 hr/day rehab nursing? Yes 4. Does the patient require coordinated care of a physician, rehab nurse, PT (1-2 hrs/day, 5 days/week) and OT (1-2 hrs/day, 5 days/week) to address physical and functional deficits in the context of the above medical diagnosis(es)?  Yes Addressing deficits in the following areas: balance, endurance, locomotion, strength, transferring, bowel/bladder control, bathing, dressing, feeding, grooming, toileting and psychosocial support 5. Can the patient actively participate in an intensive therapy program of at least 3 hrs of therapy per day at least 5 days per week? Yes 6. The potential for patient to make measurable gains while on inpatient rehab is excellent 7. Anticipated functional outcomes upon discharge from inpatient rehab are modified independent and supervision  with PT, modified independent and supervision with OT, n/a with SLP. 8. Estimated rehab length of stay to reach the above functional goals is: 8-13 days 9. Does the patient have adequate social supports to accommodate these discharge functional goals? Yes and Potentially 10. Anticipated D/C setting: Home 11. Anticipated post D/C treatments: HH therapy 12. Overall Rehab/Functional Prognosis: good  RECOMMENDATIONS: This patient's condition is appropriate for continued rehabilitative care in the following setting: CIR Patient has agreed to participate in recommended program. Yes Note that insurance prior authorization may be required for reimbursement for recommended care.  Comment: Need to confirm availability of caregiver. Pt states caregiver can be there 24 hr/day  Ranelle Oyster, MD, [facility] Physical Medicine & Rehabilitation 08/13/2014     08/13/2014

## 2014-08-13 NOTE — Progress Notes (Signed)
CARDIAC REHAB PHASE I   PRE:  Rate/Rhythm: 73 SR with PVCs    BP: sitting 150/89    SaO2: 95 RA  MODE:  Ambulation: 150 ft   POST:  Rate/Rhythm: 92 then to 132 afib after sitting    BP: sitting 167/100     SaO2: 95 RA  Assist x2 mod to stand with gait belt. Able to walk with RW, assist x2 with gait belt however very exerted. Leans over RW. Rests very brief x3. To EOB, HR in 90s SR then went into presumably Afib at 132 rate for approximately 30 sec. Returned to SR with PAC/PVCs. To bed, tired. Will follow with chair next walk as pt would benefit from rest.  [phone]  Harriet MassonReeve, Bracken Moffa Kristan CES, ACSM 08/13/2014 11:53 AM

## 2014-08-13 NOTE — Progress Notes (Signed)
Inpt rehab bed is not available for this pt this week. I will notify RNCM. I will follow up next week if pt remains in house. [phone]

## 2014-08-13 NOTE — Telephone Encounter (Signed)
New Message        TCM scheduled for 08/23/14 per Ronie Spiesayna Dunn.

## 2014-08-13 NOTE — Care Management Note (Unsigned)
    Page 1 of 1   08/13/2014     5:27:00 PM CARE MANAGEMENT NOTE 08/13/2014  Patient:  Barrie DunkerHOMAS,Brittian N   Account Number:  [id number]  Date Initiated:  08/13/2014  Documentation initiated by:  Nealy Hickmon  Subjective/Objective Assessment:   Pt s/p TAVR     Action/Plan:   PT/OT recommending CIR, but no beds available.  Referral to CSW for SNF backup.   Anticipated DC Date:  08/14/2014   Anticipated DC Plan:  SKILLED NURSING FACILITY  In-house referral  Clinical Social Worker      DC Planning Services  CM consult      Choice offered to / List presented to:             Status of service:  In process, will continue to follow Medicare Important Message given?  YES (If response is "NO", the following Medicare IM given date fields will be blank) Date Medicare IM given:  08/13/2014 Medicare IM given by:  Eythan Jayne Date Additional Medicare IM given:   Additional Medicare IM given by:    Discharge Disposition:    Per UR Regulation:  Reviewed for med. necessity/level of care/duration of stay  If discussed at Long Length of Stay Meetings, dates discussed:    Comments:

## 2014-08-13 NOTE — Progress Notes (Signed)
Utilization review completed. Cybil Senegal, RN, BSN. 

## 2014-08-13 NOTE — Clinical Social Work Psychosocial (Addendum)
Clinical Social Work Department BRIEF PSYCHOSOCIAL ASSESSMENT 08/13/2014  Patient:  Eugene DunkerHOMAS,Caelen N     Account Number:  [id number]     Admit date:  08/10/2014  Clinical Social Worker:  Elouise MunroeANTERHAUS,Loyd Salvador, LCSWA  Date/Time:  08/13/2014 05:04 PM  Referred by:  Case Manager  Date Referred:  08/13/2014 Referred for  SNF Placement   Other Referral:   Interview type:  Patient Other interview type:   Caregiver    PSYCHOSOCIAL DATA Living Status:  FRIEND(S) Admitted from facility:   Level of care:   Primary support name:  Jeanella AntonMichael Kenney Primary support relationship to patient:  FRIEND Degree of support available:   Patient's friend lives with him and helps with caregiving. Patient's pastor Orland PenmanJames [name] is his legal guardian and power of attorney.    CURRENT CONCERNS  Other Concerns:    SOCIAL WORK ASSESSMENT / PLAN Patient is a 80 year old male who lives with his friend who is a caregiver for him.  Patient was alert and oriented x3, but drowsy.  Patient states he has never been to rehab, but discussion with his POA and Caregiver said he has been to SNF before for rehab and would prefer [facility] if a bed is available.  Patient and POA are in agreement to going to SNF for rehab.  Patient plans to go to SNF for rehab and then return back home once his strength has improved.  Patient to be discharged to SNF once orders have been received from physican.   Assessment/plan status:   Other assessment/ plan:   Information/referral to community resources:    PATIENT'S/FAMILY'S RESPONSE TO PLAN OF CARE: Patient, POA, and caregiver in agreement of plan.   Ervin KnackEric R. Citlally Captain, MSW, Theresia MajorsLCSWA [phone] 08/13/2014 5:11 PM

## 2014-08-13 NOTE — Progress Notes (Signed)
    Subjective:  No chest pain or dyspnea. C/O constipation.   Objective:  Vital Signs in the last 24 hours: Temp:  [97.3 F (36.3 C)-98.7 F (37.1 C)] 97.6 F (36.4 C) (11/06 0537) Pulse Rate:  [66-84] 71 (11/06 0537) Resp:  [18-24] 18 (11/06 0537) BP: (121-165)/(61-88) 149/86 mmHg (11/06 0537) SpO2:  [96 %-100 %] 97 % (11/06 0537) Weight:  [199 lb 6.4 oz (90.447 kg)] 199 lb 6.4 oz (90.447 kg) (11/06 0500)  Intake/Output from previous day: 11/05 0701 - 11/06 0700 In: 300 [P.O.:300] Out: 1175 [Urine:1175]  Physical Exam: Pt is alert and oriented, elderly male in NAD HEENT: normal Neck: JVP - normal Lungs: CTA bilaterally CV: RRR without murmur or gallop Abd: soft, NT, Positive BS Ext: no C/C/E, distal pulses intact and equal, right groin site clear. Left groin site dressed - no tenderness. Skin: warm/dry no rash   Lab Results:  Recent Labs  08/11/14 0430 08/12/14 0300  WBC 6.4 6.0  HGB 12.6* 12.8*  PLT 97* 105*    Recent Labs  08/11/14 0430 08/12/14 0300  NA 139 142  K 3.8 4.1  CL 102 104  CO2 26 27  GLUCOSE 99 105*  BUN 17 23  CREATININE 0.99 1.02   No results for input(s): TROPONINI in the last 72 hours.  Invalid input(s): CK, MB  Cardiac Studies: 2D Echo: Study Conclusions  - Left ventricle: The cavity size was normal. There was mild focal basal hypertrophy of the septum. Systolic function was moderately to severely reduced. The estimated ejection fraction was in the range of 30% to 35%. Diffuse hypokinesis. There is hypokinesis of the inferior myocardium. Doppler parameters are consistent with abnormal left ventricular relaxation (grade 1 diastolic dysfunction). Doppler parameters are consistent with high ventricular filling pressure. - Aortic valve: Mean gradient (S): 8 mm Hg. Valve area (Vmax): 2.23 cm^2. Valve area (Vmean): 2.42 cm^2. - Mitral valve: Calcified annulus. Mildly thickened leaflets . There was mild  regurgitation. - Left atrium: The atrium was mildly dilated. - Pulmonary arteries: Systolic pressure was mildly increased. PA peak pressure: 35 mm Hg (S).  Tele: Personally reviewed: sinus rhythm, sinus brady  Assessment/Plan:  1. Severe AS s/p TAVR POD #3 2. Acute on chronic systolic and diastolic CHF, improved with treatment of AS and diuresis. On losartan, aldactone, furosemide.Will add metoprolol 12.5 BID and watch for bradyarrhythmias.  3. Thrombocytopenia, mild. Expected post-TAVR 4. 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 No anginal sx's. 5. Constipation: add colace prn  Dispo: Medically stable, CIR eval in progress. From my perspective would be ok to move to CIR today if accepted and bed available. He has hx of GI bleeding and has not been able to tolerate long-term DAPT in past. I think best to stop plavix and continue only on ASA 81 mg daily. Will arrange valve clinic and post-hospital follow-up.  Tonny BollmanMichael Seaira Byus, M.D. 08/13/2014, 8:01 AM

## 2014-08-13 NOTE — Plan of Care (Signed)
Problem: Phase II - Intermediate Post-Op Goal: Maintain Hemodynamic Stability Outcome: Completed/Met Date Met:  08/13/14 Goal: CBGs/Blood Glucose per SCIP Criteria Outcome: Completed/Met Date Met:  08/13/14 Goal: Pain controlled with appropriate interventions Outcome: Completed/Met Date Met:  08/13/14 Goal: Advance Diet Outcome: Completed/Met Date Met:  08/13/14 Goal: Activity Progressed Outcome: Completed/Met Date Met:  08/13/14

## 2014-08-13 NOTE — Clinical Social Work Placement (Addendum)
Clinical Social Work Department CLINICAL SOCIAL WORK PLACEMENT NOTE 08/13/2014  Patient:  Barrie DunkerHOMAS,Arsen N  Account Number:  [id number] Admit date:  08/10/2014  Clinical Social Worker:  ERIC ANTERHAUS, LCSWA  Date/time:  08/13/2014 05:13 PM  Clinical Social Work is seeking post-discharge placement for this patient at the following level of care:   SKILLED NURSING   (*CSW will update this form in Epic as items are completed)   08/13/2014  [facility]/family provided with [facility] of Clinical Social Work's list of facilities offering this level of care within the geographic area requested by the patient (or if unable, by the patient's family).  08/13/2014  Patient/family informed of their freedom to choose among providers that offer the needed level of care, that participate in Medicare, Medicaid or managed care program needed by the patient, have an available bed and are willing to accept the patient.  08/13/2014  Patient/family informed of [facility]' ownership interest in [facility], as well as of the fact that they are under no obligation to receive care at this facility.  PASARR submitted to EDS on 08/13/2014 PASARR number received on 08/13/2014  FL2 transmitted to all facilities in geographic area requested by pt/family on  08/13/2014 FL2 transmitted to all facilities within larger geographic area on 08/13/2014  Patient informed that his/her managed care company has contracts with or will negotiate with  certain facilities, including the following:     Patient/family informed of bed offers received:  08/13/2014 [facility] Physician recommends and patient chooses bed at    [facility]  08/14/2014 Patient to be transferred to facility by [facility] Patient and family notified of transfer on 08/14/2014 Name of family member notified:  Orland PenmanJames [name]  [phone]  The following physician request were entered in Epic:   Additional Comments:   Ervin KnackEric R. Anterhaus, MSW, Theresia MajorsLCSWA [phone] 08/13/2014 5:15 PM

## 2014-08-13 NOTE — Progress Notes (Signed)
      [address]       [place] [id number]             [phone]     CARDIOTHORACIC SURGERY PROGRESS NOTE  3 Days Post-Op  S/P Procedure(s) (LRB): TRANSCATHETER AORTIC VALVE REPLACEMENT, TRANSFEMORAL (N/A) TRANSESOPHAGEAL ECHOCARDIOGRAM (TEE) (N/A)  Subjective: No events overnight. Patient's only complaint this morning is constipation. Seems to be progressing very well.  Objective: Vital signs in last 24 hours: Temp:  [97.3 F (36.3 C)-98.7 F (37.1 C)] 97.6 F (36.4 C) (11/06 0537) Pulse Rate:  [66-84] 71 (11/06 0537) Cardiac Rhythm:  [-] Atrial fibrillation (11/05 1959) Resp:  [18-24] 18 (11/06 0537) BP: (121-165)/(61-88) 149/86 mmHg (11/06 0537) SpO2:  [96 %-100 %] 97 % (11/06 0537) Weight:  [199 lb 6.4 oz (90.447 kg)] 199 lb 6.4 oz (90.447 kg) (11/06 0500)  Physical Exam:  Rhythm:   NSR with PVCs  Breath sounds: Clear to auscultation bilaterally  Heart sounds:  Normal S1 and S2  Incisions:  Clean  Abdomen:  Soft, Non tender, non distended  Extremities:  Warm, well perfused, non edematous   Intake/Output from previous day: 11/05 0701 - 11/06 0700 In: 300 [P.O.:300] Out: 1175 [Urine:1175] Intake/Output this shift:    Lab Results:  Recent Labs  08/11/14 0430 08/12/14 0300  WBC 6.4 6.0  HGB 12.6* 12.8*  HCT 37.8* 38.6*  PLT 97* 105*   BMET:  Recent Labs  08/11/14 0430 08/12/14 0300  NA 139 142  K 3.8 4.1  CL 102 104  CO2 26 27  GLUCOSE 99 105*  BUN 17 23  CREATININE 0.99 1.02  CALCIUM 8.9 9.3    CBG (last 3)   Recent Labs  08/11/14 2334 08/12/14 0420 08/12/14 0717  GLUCAP 107* 106* 112*   PT/INR:   Recent Labs  08/10/14 1100  LABPROT 15.4*  INR 1.21    CXR:  EXAM: PORTABLE CHEST - 1 VIEW  COMPARISON: 08/11/2014  FINDINGS: Right jugular Cordis has been removed. Cardiac silhouette remains mildly enlarged. Sequelae of aortic valve replacement are again identified. Thoracic aorta calcification is again  seen. The lungs are hypoinflated with mild central pulmonary vascular congestion and stable to minimally increased interstitial densities bilaterally. There is persistent retrocardiac opacity in the left lower lobe. Small left pleural effusion not excluded. No pneumothorax is identified. Chronic deformity of the proximal right humerus is again partially visualized.  IMPRESSION: Hypoinflation with likely mild residual interstitial edema, slightly increased from prior.   Electronically Signed  By: Sebastian AcheAllen Grady  On: 08/12/2014 08:05  Assessment/Plan: S/P Procedure(s) (LRB): TRANSCATHETER AORTIC VALVE REPLACEMENT, TRANSFEMORAL (N/A) TRANSESOPHAGEAL ECHOCARDIOGRAM (TEE) (N/A)  Patient progressing well post op day 3. Cardiology has consulted inpatient rehab for assessment secondary to recommendations of physical therapy. Colace ordered for constipation.   Eugene [name], Rebecca, Eugene [name] 08/13/2014 8:05 AM  I have seen and examined the patient and agree with the assessment and plan as outlined.  Ready for d/c to inpatient rehab service.  OWEN,CLARENCE H 08/13/2014 2:49 PM

## 2014-08-13 NOTE — Discharge Summary (Signed)
Discharge Summary   Patient ID: Eugene [name] MRN: [id number], DOB/AGE: [birth year] 80 y.o. Admit date: 08/10/2014 D/C date:     08/13/2014  Primary Care Provider: Earl Lagos, MD Primary Cardiologist: Excell Seltzer  Primary Discharge Diagnoses:  1. Severe AS  - s/p TAVR this admission - h/o balloon valvuloplasty 11/2011 2. Acute on chronic systolic and diastolic CHF/ischemic cardiomyopathy 3. Thrombocytopenia, mild, expected post-TAVR 4. CAD - history - h/o remote inferior MI, h/o PCI to RCA in 1991, syncope 2013: severe AS and high grade prox LAD and other diffuse disease - high risk for CABG, s/p BMS to prox LAD and balloon aortic valvuloplasty - recent cath - 07/16/14 - diffuse CAD, managed medically 5. Constipation 6. Obesity Body mass index is 31.22 kg/(m^2).  Secondary Discharge Diagnoses:  1. H/o recurrent GI bleeding (also prior bleeding on DAPT) 2. PVD with h/o ulcer on the left great toe s/p angioplasty of the left anterior tibial artery  3. Glaucoma 4. Diverticulosis - "multiple hospital admissions for GI bleed, has an episode roughly every 6 months,, last colonscopy in 2006 showed diverticulosis, was evaluated by Dr. Madilyn Fireman in the hospital in 2011 for lower GI bleed andd it was suggested that the patient was not actively bleeding at that time and given his comorbid conditions colonoscopy was deferred. He is also chronically constipated" 5. Macular degeneration 6. Allergic rhinitis 7. OSA on CPAP 8. HTN 9. HLD 10. SVT - "2 syndromes - nonsustained atrial tachycardia//adenosine responsive diuretic positive reentry probably AV node reentry" 11. H/o recurrent GI bleed 12. Hypothyroidism 13. GERD 14. Falls frequently 15. Arthritis 16. Paroxysmal atrial fibrillation 17. Paroxysmal atrial tachycardia 18. Limited mobility  Hospital Course:  Eugene [name] is an 80 y/o M with history of severe AS, CAD, recurrent GI bleeding, OSA, HTN, HLD, SVT, PAT/PAF on amiodarone, chronic  combined CHF, frequent falls and limited mobility who presented to [facility]/10/2014 for planned TAVR. He has a history of CAD dating back to the 1990s. He had a remote inferior MI and underwent remote angioplasty of the RCA. He presented with syncope in 2013 and was noted to have severe aortic stenosis as well as high-grade coronary obstruction in the proximal LAD. He was considered very high risk for cardiac surgery and he underwent PCI of the proximal LAD with a bare-metal stent. He then underwent balloon aortic valvuloplasty. He's had episodes of GI bleeding on dual antiplatelet therapy and has been maintained on aspirin alone. The patient has also had cardiac arrhythmia with atrial tachycardia and he is managed with amiodarone. His chart also lists PAF and SVT. About one year ago he had a nonhealing ulcer on the left great toe and he underwent angioplasty of the left anterior tibial artery by Dr. Edilia Bo. He has been able to maintain physical independence but has become more physically limited. He recently presented to the office complaining of worsening SOB even with low level activity. He has had dizziness in the presence of hypotension 95/55. He also developed leg swelling and became dependent on diuretic therapy. The patient underwent a f/u echo 06/18/14 showing normal LV size with moderate LV hypertrophy. EF 35% with wall motion abnormalities noted above, severe aortic stenosis, mild to moderate MR, normal RV size and systolic function, moderate pulmonary hypertension. Dr. Excell Seltzer discussed the options with the patient and ultimately the decision was made to go forward with TAVR evaluation. He underwent cath 07/16/14 showing  1. Diffuse coronary artery disease with continued patency of the stented  segment in the proximal LAD, moderate stenosis of the left circumflex, and severe diffuse stenosis of the right coronary artery with left-to-right collaterals identified 2. Essentially normal right  heart intracardiac filling pressures 3. Severely calcified aortic valve with no known severe aortic stenosis and angiographic evidence of 1+ aortic insufficiency Dr. Excell Seltzer recommended medical therapy for his CAD ("The patient has had an old inferior wall infarct and I do not think revascularization of his RCA is indicated"). The patient was seen by Dr. Laneta Simmers and Dr. Cornelius Moras who agreed he would be at high risk for conventional surgical AVR both with or without bypass. He was admitted to the hospital and underwent TAVR via transfemoral Approach with Edwards Sapien XT THV (size 29 mm, model # 9300TFX, serial # [id number]). Post op course was notable for volume overload requiring Lasix (a/c CHF), transient disorientation, expected mild thrombocytopenia, and constipation treated with colace. F/u echo 08/11/14: mild focal basal hypertrophy of septum, EF 30-35%, diffuse HK, HK of inferior myocardium, grade 1 d/d, mild MR, PA pressure mildly increased, no AI/AS. Initially Plavix was added for his TAVR but Dr. Excell Seltzer stopped this due to his history of GIB on DAPT. He also added low dose metoprolol with recommendation to watch for bradyarrhythmias. The patient remains on aspirin. The patient has been seen by PT/OT and CIR was recommended. He has been evaluated for their services and will be discharged to their department as soon as cleared with insurance. The ultimate plan is for him to go home with 24-hr caregiver services. Dr. Excell Seltzer has seen and examined the patient today and feels he is stable for discharge.  He will have a transition of care appoinment on 11/16 in our office, then have his valve clinic/echo appointment in January.   Discharge Vitals: Blood pressure 149/86, pulse 71, temperature 97.6 F (36.4 C), temperature source Oral, resp. rate 18, height 5\' 7"  (1.702 m), weight 199 lb 6.4 oz (90.447 kg), SpO2 97 %.  Labs: Lab Results  Component Value Date   WBC 6.0 08/12/2014   HGB 12.8* 08/12/2014   HCT  38.6* 08/12/2014   MCV 94.8 08/12/2014   PLT 105* 08/12/2014    Recent Labs Lab 08/06/14 1342  08/12/14 0300  NA 138  < > 142  K 4.2  < > 4.1  CL 103  < > 104  CO2 20  < > 27  BUN 22  < > 23  CREATININE 0.94  < > 1.02  CALCIUM 9.5  < > 9.3  PROT 6.4  --   --   BILITOT 0.5  --   --   ALKPHOS 77  --   --   ALT 11  --   --   AST 16  --   --   GLUCOSE 106*  < > 105*  < > = values in this interval not displayed.  Lab Results  Component Value Date   CHOL 132 11/27/2012   HDL 35* 11/27/2012   LDLCALC 60 11/27/2012   TRIG 186* 11/27/2012    Diagnostic Studies/Procedures   TAVR with outpatient studies - see Epic  Dg Chest 2 View  08/06/2014   CLINICAL DATA:  Preop for aortic stenosis  EXAM: CHEST  2 VIEW  COMPARISON:  04/24/2014  FINDINGS: Cardiomediastinal silhouette is stable. There is chronic elevation of the right hemidiaphragm. No acute infiltrate or pulmonary edema. Degenerative changes right shoulder. Stable chronic fracture deformity of proximal right humerus. Atherosclerotic calcifications of thoracic aorta.  IMPRESSION: No active  disease. Atherosclerotic calcifications of thoracic aorta again noted.   Electronically Signed   By: Natasha Mead M.D.   On: 08/06/2014 15:01   Dg Chest Port 1 View  08/12/2014   CLINICAL DATA:  Atelectasis.  EXAM: PORTABLE CHEST - 1 VIEW  COMPARISON:  08/11/2014  FINDINGS: Right jugular Cordis has been removed. Cardiac silhouette remains mildly enlarged. Sequelae of aortic valve replacement are again identified. Thoracic aorta calcification is again seen. The lungs are hypoinflated with mild central pulmonary vascular congestion and stable to minimally increased interstitial densities bilaterally. There is persistent retrocardiac opacity in the left lower lobe. Small left pleural effusion not excluded. No pneumothorax is identified. Chronic deformity of the proximal right humerus is again partially visualized.  IMPRESSION: Hypoinflation with likely  mild residual interstitial edema, slightly increased from prior.   Electronically Signed   By: Sebastian Ache   On: 08/12/2014 08:05   Dg Chest Port 1 View  08/11/2014   CLINICAL DATA:  Status post aortic valve replacement  EXAM: PORTABLE CHEST - 1 VIEW  COMPARISON:  Portable chest x-ray of August 10, 2014  FINDINGS: The lungs are adequately inflated. The pulmonary interstitial markings have markedly improved bilaterally. The left hemidiaphragm is slightly less well demonstrated today however. The cardiopericardial silhouette remains enlarged. The pulmonary vascularity is more distinct today. The right internal jugular Cordis sheath tip projects over the proximal portion of the SVC. The Swan-Ganz catheter has been removed. There is chronic deformity of the right shoulder and proximal humeral shaft.  IMPRESSION: Improved pulmonary edema consistent with resolving CHF.   Electronically Signed   By: David  [place]   On: 08/11/2014 08:18   Dg Chest Port 1 View  08/10/2014   CLINICAL DATA:  Status post aortic valve replacement using transcatheter techniques  EXAM: PORTABLE CHEST - 1 VIEW  COMPARISON:  PA and lateral chest of August 06, 2014  FINDINGS: The lungs are borderline hypoinflated. The interstitial markings are increased diffusely. The pulmonary vascularity is engorged and indistinct. The cardiopericardial silhouette is enlarged. The Swan-Ganz catheter tip projects in the mid to distal portion of the right main pulmonary artery.  IMPRESSION: There is bilateral pulmonary interstitial edema likely collecting secondary to CHF. There is no evidence of pneumonia nor significant atelectasis.   Electronically Signed   By: David  [place]   On: 08/10/2014 11:40   Ct Angio Chest Aorta W/cm &/or Wo/cm  07/23/2014   CLINICAL DATA:  80 year old male with history of severe aortic stenosis. Preprocedural study prior to potential transcatheter aortic valve replacement (TAVR).  EXAM: CT ANGIOGRAPHY CHEST, ABDOMEN AND  PELVIS  TECHNIQUE: Multidetector CT imaging through the chest, abdomen and pelvis was performed using the standard protocol during bolus administration of intravenous contrast. Multiplanar reconstructed images and MIPs were obtained and reviewed to evaluate the vascular anatomy.  CONTRAST:  OMNIPAQUE IOHEXOL 350 MG/ML SOLN  COMPARISON:  CT of the abdomen and pelvis 10/21/2012.  FINDINGS: CT CHEST FINDINGS  Mediastinum: Heart size is mildly enlarged with mild left ventricular dilatation, and areas of myocardial thinning involving the inferolateral wall of the left ventricle, compatible with scarring from prior myocardial infarction. There is atherosclerosis of the thoracic aorta, the great vessels of the mediastinum and the coronary arteries, including calcified atherosclerotic plaque in the left main, left anterior descending, left circumflex and right coronary arteries. Severe thickening and calcification of the aortic valve. There is no significant pericardial fluid, thickening or pericardial calcification. No pathologically enlarged mediastinal or hilar lymph nodes.  Small hiatal hernia. 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  Lungs/Pleura: Extensive scarring throughout the basal segments of the lower lobes of the lungs bilaterally. There is a background of mild diffuse ground-glass attenuation with mild interlobular septal thickening, favored to reflect mild interstitial pulmonary edema. Trace bilateral pleural effusions layering dependently.  Musculoskeletal: Old compression fracture of T5 with approximately 15% loss of anterior vertebral body height. There are no aggressive appearing lytic or blastic lesions noted in the visualized portions of the skeleton.  CT ABDOMEN AND PELVIS FINDINGS  Hepatobiliary: 8 mm partially calcified gallstone in the dependent portion of the gallbladder. No current findings to suggest an acute cholecystitis at this time. Multiple  low-attenuation hepatic lesions, many of which are too small to definitively characterize. These lesions are generally similar in size, number and pattern of distribution compared to the prior study, compatible with benign lesions such as simple cysts. The largest of these lesions are all compatible with simple cysts, including a 2.6 cm lesion in the periphery of segment for a. One of the lesions previously noted in the lateral aspect of segment 2 has regressed, presumably secondary to spontaneous involution. No intra or extrahepatic biliary ductal dilatation.  Pancreas: Unremarkable.  Spleen: Unremarkable.  Adrenals/Urinary Tract: 1.2 x 0.9 cm right adrenal nodule is unchanged, and although incompletely characterized is favored to be benign lesions such as a small had adenoma. Minimal thickening of the left adrenal gland is also unchanged, potentially related of mild adrenal hyperplasia. There are multiple sub cm low-attenuation lesions in the kidneys bilaterally which are too small to definitively characterize, but stable compared to the prior study, favored to represent tiny cysts. In addition, there is an exophytic 2 cm low-attenuation lesion lower pole of the right kidney, which again has some dependent high attenuation debris, compatible with a minimally complex cyst containing proteinaceous or hemorrhagic debris.  Stomach/Bowel: Small hiatal hernia ; the stomach is otherwise normal in appearance. No pathologic dilatation of the small bowel or colon. Numerous colonic diverticulae are noted, most severe in the descending and sigmoid colon, without surrounding inflammatory changes to suggest an acute diverticulitis at this time. Normal appendix.  Vascular/Lymphatic: Extensive atherosclerosis throughout the abdominal and pelvic vasculature, with vascular findings and measurements pertinent to potential TAVR procedure, as detailed below. Image 188 of series 4 again demonstrates a small ulcerated plaque of the  infrarenal abdominal aorta immediately above the IMA origin (unchanged). No pathologically enlarged lymph nodes in the abdomen or pelvis.  Reproductive: Prostate gland is unremarkable in appearance.  Other: No significant volume of ascites.  No pneumoperitoneum.  Musculoskeletal: Multiple old vertebral body compression fractures in the lumbar spine appear unchanged, most severe at L3 where there is 70% loss of anterior vertebral body height and 30% loss of posterior vertebral body height. 5 mm of anterolisthesis of L4 upon L5 is unchanged. There are no aggressive appearing lytic or blastic lesions noted in the visualized portions of the skeleton. Well-defined sclerotic lesion in the right side of the ileum is unchanged, compatible with a bone island.  VASCULAR MEASUREMENTS PERTINENT TO TAVR:  AORTA:  Minimal Aortic Diameter - 13 x 9 mm in the infrarenal abdominal aorta at the level of the IMA origin  Severity of Aortic Calcification -  severe  RIGHT PELVIS:  Right Common Iliac Artery -  Minimal Diameter - 12.9 x 7.5 mm  Tortuosity - moderate  Calcification - moderate  Right External Iliac Artery -  Minimal Diameter -  9.5 x 10.9 mm  Tortuosity - severe  Calcification - mild  Right Common Femoral Artery -  Minimal Diameter - 10.0 x 7.3 mm  Tortuosity - mild  Calcification - moderate  LEFT PELVIS:  Left Common Iliac Artery -  Minimal Diameter - 11.7 x 10.3 mm  Tortuosity - moderate  Calcification - mild  Left External Iliac Artery -  Minimal Diameter - 9.1 x 9.9 mm  Tortuosity - moderate  Calcification - mild  Left Common Femoral Artery -  Minimal Diameter - 10.0 x 8.3 mm  Tortuosity - mild  Calcification - moderate  Review of the MIP images confirms the above findings.  IMPRESSION: 1. Vascular findings and measurements pertinent to potential TAVR procedure, as detailed above. This patient does appear to have suitable pelvic arterial access. 2. Cardiomegaly with mild left ventricular dilatation and scarring in the  inferolateral wall of the left ventricle, presumably from prior myocardial infarction. At this time, there is evidence of mild interstitial pulmonary edema with trace bilateral pleural effusions, suggesting mild congestive heart failure. 3. Extensive colonic diverticulosis without findings to suggest acute diverticulitis at this time. 4. Cholelithiasis without evidence of acute cholecystitis at this time. 5. Additional incidental findings, similar to the prior examination, as detailed above.   Electronically Signed   By: Trudie Reedaniel  Entrikin M.D.   On: 07/23/2014 12:07   Ct Angio Abd/pel W/ And/or W/o  07/23/2014   CLINICAL DATA:  80 year old male with history of severe aortic stenosis. Preprocedural study prior to potential transcatheter aortic valve replacement (TAVR).  EXAM: CT ANGIOGRAPHY CHEST, ABDOMEN AND PELVIS  TECHNIQUE: Multidetector CT imaging through the chest, abdomen and pelvis was performed using the standard protocol during bolus administration of intravenous contrast. Multiplanar reconstructed images and MIPs were obtained and reviewed to evaluate the vascular anatomy.  CONTRAST:  100mL OMNIPAQUE IOHEXOL 350 MG/ML SOLN  COMPARISON:  CT of the abdomen and pelvis 10/21/2012.  FINDINGS: CT CHEST FINDINGS  Mediastinum: Heart size is mildly enlarged with mild left ventricular dilatation, and areas of myocardial thinning involving the inferolateral wall of the left ventricle, compatible with scarring from prior myocardial infarction. There is atherosclerosis of the thoracic aorta, the great vessels of the mediastinum and the coronary arteries, including calcified atherosclerotic plaque in the left main, left anterior descending, left circumflex and right coronary arteries. Severe thickening and calcification of the aortic valve. There is no significant pericardial fluid, thickening or pericardial calcification. No pathologically enlarged mediastinal or hilar lymph nodes. Small hiatal hernia. Proximal  right subclavian artery aneurysm measuring up to 1.9 cm in diameter.  Lungs/Pleura: Extensive scarring throughout the basal segments of the lower lobes of the lungs bilaterally. There is a background of mild diffuse ground-glass attenuation with mild interlobular septal thickening, favored to reflect mild interstitial pulmonary edema. Trace bilateral pleural effusions layering dependently.  Musculoskeletal: Old compression fracture of T5 with approximately 15% loss of anterior vertebral body height. There are no aggressive appearing lytic or blastic lesions noted in the visualized portions of the skeleton.  CT ABDOMEN AND PELVIS FINDINGS  Hepatobiliary: 8 mm partially calcified gallstone in the dependent portion of the gallbladder. No current findings to suggest an acute cholecystitis at this time. Multiple low-attenuation hepatic lesions, many of which are too small to definitively characterize. These lesions are generally similar in size, number and pattern of distribution compared to the prior study, compatible with benign lesions such as simple cysts. The largest of these lesions are all compatible with simple cysts, including  a 2.6 cm lesion in the periphery of segment for a. One of the lesions previously noted in the lateral aspect of segment 2 has regressed, presumably secondary to spontaneous involution. No intra or extrahepatic biliary ductal dilatation.  Pancreas: Unremarkable.  Spleen: Unremarkable.  Adrenals/Urinary Tract: 1.2 x 0.9 cm right adrenal nodule is unchanged, and although incompletely characterized is favored to be benign lesions such as a small had adenoma. Minimal thickening of the left adrenal gland is also unchanged, potentially related of mild adrenal hyperplasia. There are multiple sub cm low-attenuation lesions in the kidneys bilaterally which are too small to definitively characterize, but stable compared to the prior study, favored to represent tiny cysts. In addition, there is an  exophytic 2 cm low-attenuation lesion lower pole of the right kidney, which again has some dependent high attenuation debris, compatible with a minimally complex cyst containing proteinaceous or hemorrhagic debris.  Stomach/Bowel: Small hiatal hernia ; the stomach is otherwise normal in appearance. No pathologic dilatation of the small bowel or colon. Numerous colonic diverticulae are noted, most severe in the descending and sigmoid colon, without surrounding inflammatory changes to suggest an acute diverticulitis at this time. Normal appendix.  Vascular/Lymphatic: Extensive atherosclerosis throughout the abdominal and pelvic vasculature, with vascular findings and measurements pertinent to potential TAVR procedure, as detailed below. Image 188 of series 4 again demonstrates a small ulcerated plaque of the infrarenal abdominal aorta immediately above the IMA origin (unchanged). No pathologically enlarged lymph nodes in the abdomen or pelvis.  Reproductive: Prostate gland is unremarkable in appearance.  Other: No significant volume of ascites.  No pneumoperitoneum.  Musculoskeletal: Multiple old vertebral body compression fractures in the lumbar spine appear unchanged, most severe at L3 where there is 70% loss of anterior vertebral body height and 30% loss of posterior vertebral body height. 5 mm of anterolisthesis of L4 upon L5 is unchanged. There are no aggressive appearing lytic or blastic lesions noted in the visualized portions of the skeleton. Well-defined sclerotic lesion in the right side of the ileum is unchanged, compatible with a bone island.  VASCULAR MEASUREMENTS PERTINENT TO TAVR:  AORTA:  Minimal Aortic Diameter - 13 x 9 mm in the infrarenal abdominal aorta at the level of the IMA origin  Severity of Aortic Calcification -  severe  RIGHT PELVIS:  Right Common Iliac Artery -  Minimal Diameter - 12.9 x 7.5 mm  Tortuosity - moderate  Calcification - moderate  Right External Iliac Artery -  Minimal  Diameter - 9.5 x 10.9 mm  Tortuosity - severe  Calcification - mild  Right Common Femoral Artery -  Minimal Diameter - 10.0 x 7.3 mm  Tortuosity - mild  Calcification - moderate  LEFT PELVIS:  Left Common Iliac Artery -  Minimal Diameter - 11.7 x 10.3 mm  Tortuosity - moderate  Calcification - mild  Left External Iliac Artery -  Minimal Diameter - 9.1 x 9.9 mm  Tortuosity - moderate  Calcification - mild  Left Common Femoral Artery -  Minimal Diameter - 10.0 x 8.3 mm  Tortuosity - mild  Calcification - moderate  Review of the MIP images confirms the above findings.  IMPRESSION: 1. Vascular findings and measurements pertinent to potential TAVR procedure, as detailed above. This patient does appear to have suitable pelvic arterial access. 2. Cardiomegaly with mild left ventricular dilatation and scarring in the inferolateral wall of the left ventricle, presumably from prior myocardial infarction. At this time, there is evidence of mild interstitial pulmonary edema  with trace bilateral pleural effusions, suggesting mild congestive heart failure. 3. Extensive colonic diverticulosis without findings to suggest acute diverticulitis at this time. 4. Cholelithiasis without evidence of acute cholecystitis at this time. 5. Additional incidental findings, similar to the prior examination, as detailed above.   Electronically Signed   By: Trudie Reedaniel  Entrikin M.D.   On: 07/23/2014 12:07    Discharge Medications   Current Discharge Medication List    START taking these medications   Details  acetaminophen (TYLENOL) 500 MG tablet Take 1-2 tablets (500-1,000 mg total) by mouth every 6 (six) hours as needed for mild pain or moderate pain.    docusate sodium 100 MG CAPS Take 100 mg by mouth 2 (two) times daily as needed for mild constipation.    metoprolol tartrate (LOPRESSOR) 25 MG tablet Take 0.5 tablets (12.5 mg total) by mouth 2 (two) times daily.    mupirocin ointment (BACTROBAN) 2 % Place 1 application into the nose  2 (two) times daily. Through 08/14/14    traMADol (ULTRAM) 50 MG tablet Take 1-2 tablets (50-100 mg total) by mouth every 12 (twelve) hours as needed for moderate pain or severe pain.      CONTINUE these medications which have NOT CHANGED   Details  amiodarone (PACERONE) 200 MG tablet Take 200 mg by mouth daily.    aspirin EC 81 MG tablet Take 81 mg by mouth daily.    Calcium Carb-Cholecalciferol (CALCIUM 600+D) 600-800 MG-UNIT TABS Take 1 tablet by mouth daily.    ferrous sulfate 325 (65 FE) MG tablet Take 325 mg by mouth daily with breakfast.    furosemide (LASIX) 20 MG tablet Take 1 tablet (20 mg total) by mouth every morning. Qty: 30 tablet, Refills: 6    levothyroxine (SYNTHROID, LEVOTHROID) 25 MCG tablet Take 25 mcg by mouth daily before breakfast.    losartan (COZAAR) 50 MG tablet Take 50 mg by mouth daily.    Multiple Vitamin (MULTIVITAMIN) tablet Take 1 tablet by mouth at bedtime.    oxybutynin (DITROPAN) 5 MG tablet Take 5 mg by mouth 2 (two) times daily.    pantoprazole (PROTONIX) 40 MG tablet Take 40 mg by mouth daily.    pravastatin (PRAVACHOL) 40 MG tablet Take 1 tablet (40 mg total) by mouth daily. Qty: 90 tablet, Refills: 4    spironolactone (ALDACTONE) 25 MG tablet Take 12.5 mg by mouth every other day.     terazosin (HYTRIN) 1 MG capsule Take 1 mg by mouth at bedtime.    vitamin B-12 (CYANOCOBALAMIN) 1000 MCG tablet Take 1,000 mcg by mouth daily.        Disposition   The patient will be discharged in stable condition to home. Discharge Instructions    Diet - low sodium heart healthy    Complete by:  As directed      Increase activity slowly    Complete by:  As directed   No driving for now. No lifting over 5 lbs for 1 week. No sexual activity for 1 week. Keep procedure site clean & dry. If you notice increased pain, swelling, bleeding or pus, call/return!  You may shower, but no soaking baths/hot tubs/pools for 1 week.          Follow-up  Information    Follow up with Ronie Spiesayna Loron Weimer, PA-C.   Specialty:  Cardiology   Why:  PA at Dr. Earmon Phoenixooper's office - 08/23/14 at 9:30am   Contact information:   [address] [place] [id number]  [phone]       Follow up with Tonny Bollman, MD.   Specialty:  Cardiology   Why:  10/19/14 at 10:30am for ultrasound first, then to see Dr. Excell Seltzer at 11:30am   Contact information:   [address] [place] [address] [phone]         Duration of Discharge Encounter: Greater than 30 minutes including physician and PA time.  Signed, Kriste Basque Legrand Lasser PA-C 08/13/2014, 10:18 AM

## 2014-08-14 NOTE — Plan of Care (Signed)
Problem: Phase III - Recovery through Discharge Goal: Maintain Hemodynamic Stability Outcome: Completed/Met Date Met:  08/14/14 Goal: Activity Progressed Outcome: Completed/Met Date Met:  08/14/14 Goal: Discharge plan remains appropriate-arrangements made Outcome: Completed/Met Date Met:  08/14/14 Goal: Other Phase III Outcomes/Goals Outcome: Completed/Met Date Met:  08/14/14  Problem: Surgery Discharge Goal: Barriers To Progression Addressed/Resolved Outcome: Completed/Met Date Met:  08/14/14 Goal: Discharge plan in place and appropriate Outcome: Completed/Met Date Met:  08/14/14 Goal: Pain controlled with appropriate interventions Outcome: Completed/Met Date Met:  08/14/14 Goal: Tolerating diet Outcome: Completed/Met Date Met:  08/14/14 Goal: Activity appropriate for discharge plan Outcome: Completed/Met Date Met:  08/14/14 Goal: Other Discharge Outcomes/Goals Outcome: Completed/Met Date Met:  08/14/14

## 2014-08-14 NOTE — Discharge Instructions (Signed)
Angiogram °An angiogram, also called angiography, is a procedure used to look at the blood vessels that carry blood to different parts of your body (arteries). In this procedure, dye is injected through a long, thin tube (catheter) into an artery. X-rays are then taken. The X-rays will show if there is a blockage or problem in a blood vessel.  °LET YOUR HEALTH CARE PROVIDER KNOW ABOUT: °· Any allergies you have, including allergies to shellfish or contrast dye.   °· All medicines you are taking, including vitamins, herbs, eye drops, creams, and over-the-counter medicines.   °· Previous problems you or members of your family have had with the use of anesthetics.   °· Any blood disorders you have.   °· Previous surgeries you have had. °· Any previous kidney problems or failure you have had.  °· Medical conditions you have.   °· Possibility of pregnancy, if this applies. °RISKS AND COMPLICATIONS °Generally, an angiogram is a safe procedure. However, as with any procedure, problems can occur. Possible problems include: °· Injury to the blood vessels, including rupture or bleeding. °· Infection or bruising at the catheter site. °· Allergic reaction to the dye or contrast used. °· Kidney damage from the dye or contrast used. °· Blood clots that can lead to a stroke or heart attack. °BEFORE THE PROCEDURE °· Do not eat or drink after midnight on the night before the procedure, or as directed by your health care provider.   °· Ask your health care provider if you may drink enough water to take any needed medicines the morning of the procedure.   °PROCEDURE °· You may be given a medicine to help you relax (sedative) before and during the procedure. This medicine is given through an IV access tube that is inserted into one of your veins.   °· The area where the catheter will be inserted will be washed and shaved. This is usually done in the groin but may be done in the fold of your arm (near your elbow) or in the wrist. °· A  medicine will be given to numb the area where the catheter will be inserted (local anesthetic). °· The catheter will be inserted with a guide wire into an artery. The catheter is guided by using a type of X-ray (fluoroscopy) to the blood vessel being examined.   °· Dye is then injected into the catheter, and X-rays are taken. The dye helps to show where any narrowing or blockages are located.   °AFTER THE PROCEDURE  °· If the procedure is done through the leg, you will be kept in bed lying flat for several hours. You will be instructed to not bend or cross your legs. °· The insertion site will be checked frequently. °· The pulse in your feet or wrist will be checked frequently. °· Additional blood tests, X-rays, and electrocardiography may be done.   °· You may need to stay in the hospital overnight for observation.   °Document Released: 07/04/2005 Document Revised: 09/29/2013 Document Reviewed: 02/25/2013 °ExitCare® Patient Information ©2015 [employer]. This information is not intended to replace advice given to you by your health care provider. Make sure you discuss any questions you have with your health care provider. ° °

## 2014-08-14 NOTE — Progress Notes (Signed)
Patient ID: Barrie Dunkerfird N Dutta, male   DOB: 01/06/1924, 80 y.o.   MRN: [id number]    Subjective:  No complaints. No CP or dyspnea. Ambulated with cardiac rehab around circle   Objective:  Vital Signs in the last 24 hours: Temp:  [97.7 F (36.5 C)-98.1 F (36.7 C)] 98.1 F (36.7 C) (11/07 0357) Pulse Rate:  [64-72] 64 (11/07 0357) Resp:  [18-20] 18 (11/07 0357) BP: (131-145)/(64-75) 131/64 mmHg (11/07 0357) SpO2:  [90 %-95 %] 90 % (11/07 0357) Weight:  [89.2 kg (196 lb 10.4 oz)] 89.2 kg (196 lb 10.4 oz) (11/07 0412)  Intake/Output from previous day: 11/06 0701 - 11/07 0700 In: 360 [P.O.:360] Out: 975 [Urine:975]  Physical Exam: Pt is alert and oriented, pleasant elderly male in NAD HEENT: normal Neck: JVP - normal Lungs: CTA bilaterally CV: RRR SEM  Murmur No AR murmur  or gallop Abd: soft, NT, Positive BS, no hepatomegaly Ext: no C/C/E, distal pulses intact and equal Skin: dressing on right shin  Neuro: moves all extremities  Condom catheter   Lab Results:  Recent Labs  08/12/14 0300  WBC 6.0  HGB 12.8*  PLT 105*    Recent Labs  08/12/14 0300  NA 142  K 4.1  CL 104  CO2 27  GLUCOSE 105*  BUN 23  CREATININE 1.02   No results for input(s): TROPONINI in the last 72 hours.  Invalid input(s): CK, MB  Cardiac Studies: 2D Echo: Left ventricle: The cavity size was normal. There was mild focal basal hypertrophy of the septum. Systolic function was moderately to severely reduced. The estimated ejection fraction was in the range of 30% to 35%. Diffuse hypokinesis. Regional wall motion abnormalities:  There is hypokinesis of the inferior myocardium. Doppler parameters are consistent with abnormal left ventricular relaxation (grade 1 diastolic dysfunction). Doppler parameters are consistent with high ventricular filling pressure.  ------------------------------------------------------------------- Aortic valve: Transcatheter Aortic Valve Replacement - Open  Left Transfemoral Approach- Edwards Sapien XT (size 29 mm, model # 9300TFX, serial # [id number]) Doppler: Transvalvular velocity was within the normal range. There was no stenosis. There was no regurgitation.  Peak velocity ratio of LVOT to aortic valve: 0.71. Valve area (Vmax): 2.23 cm^2. Indexed valve area (Vmax): 1.05 cm^2/m^2. Mean velocity ratio of LVOT to aortic valve: 0.77. Valve area (Vmean): 2.42 cm^2. Indexed valve area (Vmean): 1.14 cm^2/m^2.  Mean gradient (S): 8 mm Hg. Peak gradient (S): 15 mm Hg.  ------------------------------------------------------------------- Aorta: Aortic root: The aortic root was normal in size.  ------------------------------------------------------------------- Mitral valve:  Calcified annulus. Mildly thickened leaflets . Mobility was not restricted. Doppler: Transvalvular velocity was within the normal range. There was no evidence for stenosis. There was mild regurgitation.  ------------------------------------------------------------------- Left atrium: The atrium was mildly dilated.  ------------------------------------------------------------------- Right ventricle: The cavity size was normal. Wall thickness was normal. Systolic function was normal.  ------------------------------------------------------------------- Pulmonic valve:  Structurally normal valve.  Cusp separation was normal. Doppler: Transvalvular velocity was within the normal range. There was no evidence for stenosis. There was mild regurgitation.  ------------------------------------------------------------------- Tricuspid valve:  Structurally normal valve.  Doppler: Transvalvular velocity was within the normal range. There was trivial regurgitation.  ------------------------------------------------------------------- Pulmonary artery:  The main pulmonary artery was normal-sized. Systolic pressure was mildly  increased.  ------------------------------------------------------------------- Right atrium: The atrium was normal in size.  ------------------------------------------------------------------- Pericardium: There was no pericardial effusion.  ------------------------------------------------------------------- Systemic veins: Inferior vena cava: The vessel was normal in size. The respirophasic diameter changes were in the normal range (>= 50%), consistent  with normal central venous pressure.  Tele: Sinus rhythm  . acetaminophen  1,000 mg Oral 4 times per day  . amiodarone  200 mg Oral Daily  . aspirin EC  81 mg Oral Daily  . furosemide  20 mg Oral q morning - 10a  . levothyroxine  25 mcg Oral QAC breakfast  . losartan  50 mg Oral Daily  . metoprolol tartrate  12.5 mg Oral BID  . mupirocin ointment  1 application Nasal BID  . oxybutynin  5 mg Oral BID  . pantoprazole  40 mg Oral Daily  . pravastatin  40 mg Oral q1800  . sodium chloride  3 mL Intravenous Q12H  . sodium chloride  3 mL Intravenous Q12H  . spironolactone  12.5 mg Oral QODAY  . terazosin  1 mg Oral QHS  . vitamin B-12  1,000 mcg Oral Daily   Assessment/Plan:  1. Severe AS s/p TAVR using Sapien 3 with good result no significant AR  2. Acute on chronic systolic and diastolic CHF, improving. Continue with diuresis as tolerated. On losartan, has not been on beta-blockers secondary to hx of bradyarrhythmias. Continue aldactone, furosemide Lytes ok 11/5. 3. Thrombocytopenia, mild. Expected post-TAVR 4. 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 No anginal sx's. Continue beta blocker and asa   Awaiting placement SNF [facility]   Charlton HawsPeter Trusten Hume, M.D. 08/14/2014, 12:05 PM

## 2014-08-14 NOTE — Clinical Social Work Note (Signed)
Clinical Social Worker facilitated patient discharge including contacting patient family and facility to confirm patient discharge plans.  Clinical information faxed to facility and family agreeable with plan.  CSW arranged ambulance transport via [facility] to [facility].  RN to call report prior to discharge.  Clinical Social Worker will sign off for now as social work intervention is no longer needed. Please consult us again if new need arises.  Jesse Kao Conry, LCSW [phone] 

## 2014-08-14 NOTE — Progress Notes (Signed)
       [address]       [place] [id number]             [phone]          4 Days Post-Op Procedure(s) (LRB): TRANSCATHETER AORTIC VALVE REPLACEMENT, TRANSFEMORAL (N/A) TRANSESOPHAGEAL ECHOCARDIOGRAM (TEE) (N/A)  Subjective: Comfortable, no complaints.  Still has not had a BM.   Objective: Vital signs in last 24 hours: Patient Vitals for the past 24 hrs:  BP Temp Temp src Pulse Resp SpO2 Weight  08/14/14 0412 - - - - - - 196 lb 10.4 oz (89.2 kg)  08/14/14 0357 131/64 mmHg 98.1 F (36.7 C) Oral 64 18 90 % -  08/13/14 2155 (!) 145/75 mmHg - - 70 - - -  08/13/14 1942 138/67 mmHg 97.9 F (36.6 C) Oral 72 20 95 % -  08/13/14 1402 133/75 mmHg 97.7 F (36.5 C) Oral 71 19 94 % -   Current Weight  08/14/14 196 lb 10.4 oz (89.2 kg)     Intake/Output from previous day: 11/06 0701 - 11/07 0700 In: 360 [P.O.:360] Out: 975 [Urine:975]    PHYSICAL EXAM:  Heart: RRR, few PVCs Lungs: Clear Wound: Groin stable Extremities: No edema    Lab Results: CBC: Recent Labs  08/12/14 0300  WBC 6.0  HGB 12.8*  HCT 38.6*  PLT 105*   BMET:  Recent Labs  08/12/14 0300  NA 142  K 4.1  CL 104  CO2 27  GLUCOSE 105*  BUN 23  CREATININE 1.02  CALCIUM 9.3    PT/INR: No results for input(s): LABPROT, INR in the last 72 hours.    Assessment/Plan: S/P Procedure(s) (LRB): TRANSCATHETER AORTIC VALVE REPLACEMENT, TRANSFEMORAL (N/A) TRANSESOPHAGEAL ECHOCARDIOGRAM (TEE) (N/A) Stable s/p TAVR. Awaiting SNF vs CIR placement. Continue current care.   LOS: 4 days    COLLINS,GINA H 08/14/2014

## 2014-08-14 NOTE — Progress Notes (Signed)
CARDIAC REHAB PHASE I   PRE:  Rate/Rhythm: 78 SR  BP:  Supine:   Sitting: 113/62  Standing:    SaO2: 94% RA  MODE:  Ambulation: 150 ft   POST:  Rate/Rhythm: 86 SR  BP:  Supine:   Sitting: 124/72  Standing:    SaO2: 92% RA  Still very weak, requires assistance x 2 and a rolling walker.  One person to walk with patient and the other to follow behind with a wheel chair.  Constantly instructed to stand up tall and look straight ahead.  Was not following instructions.  Education completed re: nutrition and activity progression for home.  For SNF placement.   [id number]  Cindra EvesBehrens, Shanasia Ibrahim Adele RN, BSN 08/14/2014 12:10 PM

## 2014-08-15 ENCOUNTER — Other Ambulatory Visit: Payer: Self-pay | Admitting: Internal Medicine

## 2014-08-16 NOTE — Telephone Encounter (Signed)
Patient is currently in a Rehab class. Will call him back later. Spoke to his caretaker.

## 2014-08-16 NOTE — Telephone Encounter (Signed)
Patient contacted regarding discharge from [facility] on 08/13/2014  Patient understands to follow up with provider Ronie Spiesayna Dunn, PA on11/16/2015 at 9;30 am  [facility] Patient understands discharge instructions? [facility] Patient understands medications and regiment? Medications given per staff there Patient understands to bring all medications to this visit? Yes. Note sent to his POA, Prudencio BurlyJames Mc Daniel

## 2014-08-18 ENCOUNTER — Ambulatory Visit: Payer: Medicare Other | Admitting: Vascular Surgery

## 2014-08-18 ENCOUNTER — Encounter (HOSPITAL_COMMUNITY): Payer: Medicare Other

## 2014-08-23 ENCOUNTER — Encounter: Payer: Medicare Other | Admitting: Physician Assistant

## 2014-08-30 ENCOUNTER — Other Ambulatory Visit: Payer: Self-pay | Admitting: Internal Medicine

## 2014-09-06 ENCOUNTER — Telehealth: Payer: Self-pay | Admitting: Cardiovascular Disease

## 2014-09-06 NOTE — Telephone Encounter (Signed)
New Msg   Patient's caregiver Jeanella AntonMichael Kenney calling and would like to know if a visit is needed with Dr. [name] before January because patient just had a procedure completed in the hospital. Please contact caregiver at [phone]. Patient can also be contacted if needed at [phone].

## 2014-09-06 NOTE — Telephone Encounter (Signed)
I spoke with Casimiro NeedleMichael and made him aware that the pt was scheduled to see our office 08/23/14 and this appointment was cancelled.  Per Casimiro NeedleMichael this was done by [facility].  The pt is now back at home and I have scheduled him for appointment on 09/07/14 with Norma FredricksonLori Gerhardt NP.   I left a voicemail for Don at [facility] that order can be given for occupational therapy. I instructed him to call back if he had any further questions.

## 2014-09-06 NOTE — Telephone Encounter (Signed)
New message      Want order for occupational therapy 1 time a week for 3 weeks for ADL transfer---energy conservation---home exercise and adpation of equip training

## 2014-09-07 ENCOUNTER — Encounter: Payer: Self-pay | Admitting: Nurse Practitioner

## 2014-09-07 ENCOUNTER — Ambulatory Visit (INDEPENDENT_AMBULATORY_CARE_PROVIDER_SITE_OTHER): Payer: Medicare Other | Admitting: Nurse Practitioner

## 2014-09-07 VITALS — BP 140/72 | HR 77 | Ht 67.0 in | Wt 204.8 lb

## 2014-09-07 DIAGNOSIS — Z952 Presence of prosthetic heart valve: Secondary | ICD-10-CM

## 2014-09-07 DIAGNOSIS — I2510 Atherosclerotic heart disease of native coronary artery without angina pectoris: Secondary | ICD-10-CM

## 2014-09-07 DIAGNOSIS — I5022 Chronic systolic (congestive) heart failure: Secondary | ICD-10-CM

## 2014-09-07 DIAGNOSIS — Z954 Presence of other heart-valve replacement: Secondary | ICD-10-CM

## 2014-09-07 LAB — CBC
HCT: 43.6 % (ref 39.0–52.0)
Hemoglobin: 14.1 g/dL (ref 13.0–17.0)
MCHC: 32.4 g/dL (ref 30.0–36.0)
MCV: 95.2 fl (ref 78.0–100.0)
Platelets: 135 10*3/uL — ABNORMAL LOW (ref 150.0–400.0)
RBC: 4.58 Mil/uL (ref 4.22–5.81)
RDW: 15 % (ref 11.5–15.5)
WBC: 7.3 10*3/uL (ref 4.0–10.5)

## 2014-09-07 LAB — BASIC METABOLIC PANEL
BUN: 24 mg/dL — ABNORMAL HIGH (ref 6–23)
CO2: 22 mEq/L (ref 19–32)
Calcium: 9.4 mg/dL (ref 8.4–10.5)
Chloride: 108 mEq/L (ref 96–112)
Creatinine, Ser: 1.2 mg/dL (ref 0.4–1.5)
GFR: 60.12 mL/min (ref >60.00)
Glucose, Bld: 101 mg/dL — ABNORMAL HIGH (ref 70–99)
Potassium: 4.2 mEq/L (ref 3.5–5.1)
Sodium: 141 mEq/L (ref 135–145)

## 2014-09-07 NOTE — Progress Notes (Signed)
Eugene [name] Date of Birth: 1924-07-08 Medical Record #[id number]  History of Present Illness: Eugene [name] is seen back today for a post op/TOC visit - however he is outside the 7 and 14 day window (discharged 08/13/14). Seen for Dr. Excell Seltzer. He is an 80 year old male with history of severe AS, CAD, recurrent GI bleeding, OSA, HTN, HLD, SVT, PAT/PAF on amiodarone, chronic combined CHF, frequent falls, PVD with a nonhealing ulcer on the left great toe and he underwent angioplasty of the left anterior tibial artery by Dr. Edilia Bo in 2014.and limited mobility.   [facility] 08/10/2014 for planned TAVR. He has a history of CAD dating back to the 1990s. He had a remote inferior MI and underwent remote angioplasty of the RCA. He presented with syncope in 2013 and was noted to have severe aortic stenosis as well as high-grade coronary obstruction in the proximal LAD. He was considered very high risk for cardiac surgery and he underwent PCI of the proximal LAD with a bare-metal stent. He then underwent balloon aortic valvuloplasty to the aortic valve. He's had episodes of GI bleeding on dual antiplatelet therapy and has been maintained on aspirin alone. The patient has also had cardiac arrhythmia with atrial tachycardia and he is managed with amiodarone. His chart also lists PAF and SVT.  He had progressive and worsening SOB even with low level activity.  The patient underwent a f/u echo 06/18/14 showing normal LV size with moderate LV hypertrophy. EF 35% with wall motion abnormalities noted above, severe aortic stenosis, mild to moderate MR, normal RV size and systolic function, moderate pulmonary hypertension. Dr. Excell Seltzer discussed the options with the patient and ultimately the decision was made to go forward with TAVR evaluation.   He underwent cath 07/16/14 showing  1. Diffuse coronary artery disease with continued patency of the stented segment in the proximal LAD, moderate stenosis of  the left circumflex, and severe diffuse stenosis of the right coronary artery with left-to-right collaterals identified 2. Essentially normal right heart intracardiac filling pressures 3. Severely calcified aortic valve with no known severe aortic stenosis and angiographic evidence of 1+ aortic insufficiency  Dr. Excell Seltzer recommended medical therapy for his CAD ("The patient has had an old inferior wall infarct and I do not think revascularization of his RCA is indicated").  He was admitted to the hospital and underwent TAVR via transfemoral Approach with Edwards Sapien XT THV (size 29 mm, model # 9300TFX, serial # [id number]). Post op course was notable for volume overload requiring Lasix (a/c CHF), transient disorientation, expected mild thrombocytopenia, and constipation treated with colace. F/u echo 08/11/14: mild focal basal hypertrophy of septum, EF 30-35%, diffuse HK, HK of inferior myocardium, grade 1 d/d, mild MR, PA pressure mildly increased, no AI/AS. Initially Plavix was added for his TAVR but Dr. Excell Seltzer stopped this due to his history of GIB on DAPT. Low dose metoprolol was added but with recommendation to watch for bradyarrhythmias. The patient remains on aspirin.   He comes in today. Here with his "caregiver"/roomate. Doing ok. He says he feels pretty good. Not short of breath. Gets tired with his walking but has been trying to walk. Went out and walked his driveway and did ok. Says he "surprised himself'. No chest pain. Swelling has improved. No problems with his groin. He is not on the metoprolol - the caregiver is pretty sure about that and will double check when they get home. Has been home about 5 days  and making steady progress.    Current Outpatient Prescriptions  Medication Sig Dispense Refill  . acetaminophen (TYLENOL) 500 MG tablet Take 1-2 tablets (500-1,000 mg total) by mouth every 6 (six) hours as needed for mild pain or moderate pain.    Marland Kitchen. amiodarone (PACERONE) 200 MG tablet Take  200 mg by mouth daily.    Marland Kitchen. aspirin EC 81 MG tablet Take 81 mg by mouth daily.    . Calcium Carb-Cholecalciferol (CALCIUM 600+D) 600-800 MG-UNIT TABS Take 1 tablet by mouth daily.    Marland Kitchen. docusate sodium 100 MG CAPS Take 100 mg by mouth 2 (two) times daily as needed for mild constipation.    . ferrous sulfate 325 (65 FE) MG tablet Take 325 mg by mouth daily with breakfast.    . furosemide (LASIX) 20 MG tablet Take 1 tablet (20 mg total) by mouth every morning. 30 tablet 6  . levothyroxine (SYNTHROID, LEVOTHROID) 25 MCG tablet TAKE 1 TABLET BY MOUTH EVERY DAY 30 tablet 3  . losartan (COZAAR) 50 MG tablet TAKE 1 TABLET (50 MG TOTAL) BY MOUTH DAILY. 30 tablet 1  . Multiple Vitamin (MULTIVITAMIN) tablet Take 1 tablet by mouth at bedtime.    . mupirocin ointment (BACTROBAN) 2 % Place 1 application into the nose 2 (two) times daily. Through 08/14/14    . oxybutynin (DITROPAN) 5 MG tablet Take 5 mg by mouth 2 (two) times daily.    . pantoprazole (PROTONIX) 40 MG tablet Take 40 mg by mouth daily.    . pravastatin (PRAVACHOL) 40 MG tablet Take 1 tablet (40 mg total) by mouth daily. 90 tablet 4  . spironolactone (ALDACTONE) 25 MG tablet Take 12.5 mg by mouth every other day.     . terazosin (HYTRIN) 1 MG capsule Take 1 mg by mouth at bedtime.    . traMADol (ULTRAM) 50 MG tablet Take 1-2 tablets (50-100 mg total) by mouth every 12 (twelve) hours as needed for moderate pain or severe pain.    . vitamin B-12 (CYANOCOBALAMIN) 1000 MCG tablet Take 1,000 mcg by mouth daily.     No current facility-administered medications for this visit.    Allergies  Allergen Reactions  . Penicillins Swelling and Other (See Comments)    REACTION: "turns red"  . Corn-Containing Products Other (See Comments)    Trouble digesting    Past Medical History  Diagnosis Date  . Glaucoma     Right eye  . Diverticulosis     multiple hospital admissions for GI bleed, has an episode roughly every 6 months,, last  colonscopy in  2006 showed diverticulosis, was evaluated by Dr. Madilyn FiremanHayes in the hospital in 2011 for lower GI bleed andd it  was suggested that the patient was not actively bleeding at that time and given his comorbid conditions colonoscopy was deferred. He is also chronically constipated  . Macular degeneration of right eye   . Allergic rhinitis   . CAD (coronary artery disease)     a. h/o remote inferior MI, h/o PCI to RCA in 1991. b. syncope 2013: severe AS and high grade prox LAD and other diffuse disease. High risk for CABG - s/p BMS to prox LAD and balloon aortic valvuloplasty. c. cath 07/16/14 - diffuse CAD, managed medically.  . Hypertension   . Hyperlipidemia   . Chronic combined systolic and diastolic CHF (congestive heart failure)     a. Most recent echo 08/2014: EF 30-35%.  . Supraventricular tachycardia     2 syndromes-nonsustained atrial tachycardia//adenosine  responsive diuretic positive reentry probably AV node reentry  . Aortic stenosis     a. s/p AV balloon valvuloplasty by Dr. [name] 11/2011. b. s/p TAVR 08/2014.  . Ischemic cardiomyopathy   . H/O: GI bleed     recurrent  . Hypothyroidism   . GERD (gastroesophageal reflux disease)   . Falls frequently     3 times in the past week/notes 09/11/2013  . Myocardial infarction 1991; 10/28/2011  . Arthritis     "might have slight; have trouble w/my fingers" (09/11/2013)  . PAF (paroxysmal atrial fibrillation)   . Peripheral vascular disease     a. h/o ulcer on the left great toe s/p angioplasty of the left anterior tibial artery.  . OSA on CPAP     since 91  . Limited mobility   . 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 PAT (paroxysmal atrial tachycardia)     Past Surgical History  Procedure Laterality Date  . Glaucoma surgery      Implantation of Baerveldt glaucoma device lant with scleral reinforcement using tutoplast  tissue graft right eye. [Other]  . Coronary angioplasty with stent placement  Jan. 23, 2013  . Corneal transplant Right   . Angioplasty Left 03/30/2013    tibial  . Tonsillectomy  1946  . Cataract extraction w/ intraocular lens  implant, bilateral Bilateral   . Cardiac valve replacement  11/30/2011    aortic valve.?  . Transcatheter aortic valve replacement, transfemoral N/A 08/10/2014    Procedure: TRANSCATHETER AORTIC VALVE REPLACEMENT, TRANSFEMORAL;  Surgeon: Tonny BollmanMichael Cooper, MD;  [facility];  Service: Open Heart Surgery;  Laterality: N/A;  . Tee without cardioversion N/A 08/10/2014    Procedure: TRANSESOPHAGEAL ECHOCARDIOGRAM (TEE);  Surgeon: Tonny BollmanMichael Cooper, MD;  [facility];  Service: Open Heart Surgery;  Laterality: N/A;    History  Smoking status  . Former Smoker -- 3.00 packs/day for 21 years  . Types: Cigarettes  . Quit date: 10/08/1958  Smokeless tobacco  . Never Used    History  Alcohol Use No    Family History  Problem Relation Age of Onset  . Cancer Mother     Ovarian Cancer  . Cancer Father     Lung  . Heart disease Sister     Heart disease before age 78    Review of Systems: The review of systems is per the HPI.  All other systems were reviewed and are negative.  Physical Exam: BP 140/72 mmHg  Pulse 77  Ht 5\' 7"  (1.702 m)  Wt 204 lb 12.8 oz (92.897 kg)  BMI 32.07 kg/m2 Patient is very pleasant and in no acute distress. He is a little hard of hearing. Skin is warm and dry. Color is normal.  HEENT is unremarkable. Normocephalic/atraumatic. PERRL. Sclera are nonicteric. Neck is supple. No masses. No JVD. Lungs are clear. Cardiac exam shows a regular rate and rhythm. No real murmur that I can appreciate. Abdomen is soft. Left groin incision looks good. Extremities are without edema. Gait not tested.  No gross neurologic deficits noted.  Wt Readings from Last 3 Encounters:  09/07/14 204 lb 12.8 oz (92.897 kg)  08/14/14 196 lb 10.4 oz (89.2 kg)    08/06/14 200 lb 4 oz (90.833 kg)    LABORATORY DATA/PROCEDURES:  Lab Results  Component Value Date   WBC 6.0 08/12/2014   HGB 12.8* 08/12/2014  HCT 38.6* 08/12/2014   PLT 105* 08/12/2014   GLUCOSE 105* 08/12/2014   CHOL 132 11/27/2012   TRIG 186* 11/27/2012   HDL 35* 11/27/2012   LDLCALC 60 11/27/2012   ALT 11 08/06/2014   AST 16 08/06/2014   NA 142 08/12/2014   K 4.1 08/12/2014   CL 104 08/12/2014   CREATININE 1.02 08/12/2014   BUN 23 08/12/2014   CO2 27 08/12/2014   TSH 4.000 04/07/2014   INR 1.21 08/10/2014   HGBA1C 5.2 08/06/2014    BNP (last 3 results)  Recent Labs  09/11/13 1316 04/07/14 1544  PROBNP 1443.0* 1533.0*     Assessment / Plan: 1. Severe AS - s/p TAVR - doing very well. Check follow up labs today.  2. PAF - in sinus today. Not on Metoprolol and caregiver will double check when they get home.   3. Past GI bleed - only on aspirin.  4. High risk medication  5. Advanced age - looking pretty good.   I think he is doing good. See back as planned. Recheck baseline labs today.   Patient is agreeable to this plan and will call if any problems develop in the interim.   Rosalio Macadamia, RN, ANP-C [facility] [address] [place], [place]  [address] [phone]

## 2014-09-07 NOTE — Patient Instructions (Signed)
We will be checking the following labs today CBC & BMET  Double check and make sure if he is not or is taking the Lopressor (Metoprolol)  Stay on your current medicines  Keep your follow up as planned  Call the [facility] office at [phone] if you have any questions, problems or concerns.

## 2014-09-15 ENCOUNTER — Encounter (HOSPITAL_COMMUNITY): Payer: Self-pay | Admitting: *Deleted

## 2014-09-16 ENCOUNTER — Encounter (HOSPITAL_COMMUNITY): Payer: Self-pay | Admitting: Cardiology

## 2014-09-20 ENCOUNTER — Other Ambulatory Visit: Payer: Self-pay | Admitting: Internal Medicine

## 2014-09-21 ENCOUNTER — Encounter: Payer: Self-pay | Admitting: *Deleted

## 2014-09-21 ENCOUNTER — Encounter (HOSPITAL_COMMUNITY): Payer: Self-pay | Admitting: *Deleted

## 2014-09-21 ENCOUNTER — Telehealth: Payer: Self-pay | Admitting: *Deleted

## 2014-09-21 ENCOUNTER — Observation Stay (HOSPITAL_COMMUNITY)
Admission: EM | Admit: 2014-09-21 | Discharge: 2014-09-24 | Disposition: A | Discharge status: Home Health | Payer: Medicare Other | Attending: Internal Medicine | Admitting: Internal Medicine

## 2014-09-21 DIAGNOSIS — Z955 Presence of coronary angioplasty implant and graft: Secondary | ICD-10-CM | POA: Diagnosis not present

## 2014-09-21 DIAGNOSIS — I5042 Chronic combined systolic (congestive) and diastolic (congestive) heart failure: Secondary | ICD-10-CM | POA: Diagnosis not present

## 2014-09-21 DIAGNOSIS — R262 Difficulty in walking, not elsewhere classified: Secondary | ICD-10-CM | POA: Insufficient documentation

## 2014-09-21 DIAGNOSIS — K5730 Diverticulosis of large intestine without perforation or abscess without bleeding: Secondary | ICD-10-CM | POA: Insufficient documentation

## 2014-09-21 DIAGNOSIS — I10 Essential (primary) hypertension: Secondary | ICD-10-CM | POA: Diagnosis not present

## 2014-09-21 DIAGNOSIS — E039 Hypothyroidism, unspecified: Secondary | ICD-10-CM | POA: Diagnosis not present

## 2014-09-21 DIAGNOSIS — N400 Enlarged prostate without lower urinary tract symptoms: Secondary | ICD-10-CM | POA: Insufficient documentation

## 2014-09-21 DIAGNOSIS — E785 Hyperlipidemia, unspecified: Secondary | ICD-10-CM | POA: Insufficient documentation

## 2014-09-21 DIAGNOSIS — I2510 Atherosclerotic heart disease of native coronary artery without angina pectoris: Secondary | ICD-10-CM | POA: Insufficient documentation

## 2014-09-21 DIAGNOSIS — I480 Paroxysmal atrial fibrillation: Secondary | ICD-10-CM | POA: Diagnosis not present

## 2014-09-21 DIAGNOSIS — Z952 Presence of prosthetic heart valve: Secondary | ICD-10-CM | POA: Diagnosis not present

## 2014-09-21 DIAGNOSIS — Z9181 History of falling: Secondary | ICD-10-CM | POA: Diagnosis not present

## 2014-09-21 DIAGNOSIS — R531 Weakness: Secondary | ICD-10-CM | POA: Diagnosis not present

## 2014-09-21 DIAGNOSIS — R197 Diarrhea, unspecified: Principal | ICD-10-CM

## 2014-09-21 DIAGNOSIS — K219 Gastro-esophageal reflux disease without esophagitis: Secondary | ICD-10-CM | POA: Insufficient documentation

## 2014-09-21 DIAGNOSIS — R5383 Other fatigue: Secondary | ICD-10-CM | POA: Insufficient documentation

## 2014-09-21 LAB — CBC WITH DIFFERENTIAL/PLATELET
BASOS ABS: 0 10*3/uL (ref 0.0–0.1)
Basophils Relative: 1 % (ref 0–1)
EOS PCT: 2 % (ref 0–5)
Eosinophils Absolute: 0.2 10*3/uL (ref 0.0–0.7)
HEMATOCRIT: 46.1 % (ref 39.0–52.0)
Hemoglobin: 15.1 g/dL (ref 13.0–17.0)
LYMPHS PCT: 15 % (ref 12–46)
Lymphs Abs: 1.1 10*3/uL (ref 0.7–4.0)
MCH: 31.1 pg (ref 26.0–34.0)
MCHC: 32.8 g/dL (ref 30.0–36.0)
MCV: 95.1 fL (ref 78.0–100.0)
MONO ABS: 0.7 10*3/uL (ref 0.1–1.0)
Monocytes Relative: 10 % (ref 3–12)
Neutro Abs: 5.1 10*3/uL (ref 1.7–7.7)
Neutrophils Relative %: 72 % (ref 43–77)
Platelets: 180 10*3/uL (ref 150–400)
RBC: 4.85 MIL/uL (ref 4.22–5.81)
RDW: 14.8 % (ref 11.5–15.5)
WBC: 7.1 10*3/uL (ref 4.0–10.5)

## 2014-09-21 LAB — URINALYSIS, ROUTINE W REFLEX MICROSCOPIC
Glucose, UA: NEGATIVE mg/dL
HGB URINE DIPSTICK: NEGATIVE
Ketones, ur: 15 mg/dL — AB
Leukocytes, UA: NEGATIVE
Nitrite: NEGATIVE
PROTEIN: NEGATIVE mg/dL
Specific Gravity, Urine: 1.028 (ref 1.005–1.030)
UROBILINOGEN UA: 0.2 mg/dL (ref 0.0–1.0)
pH: 5 (ref 5.0–8.0)

## 2014-09-21 LAB — PROTIME-INR
INR: 1.04 (ref 0.00–1.49)
PROTHROMBIN TIME: 13.7 s (ref 11.6–15.2)

## 2014-09-21 LAB — LIPASE, BLOOD: LIPASE: 13 U/L (ref 11–59)

## 2014-09-21 LAB — COMPREHENSIVE METABOLIC PANEL
ALT: 9 U/L (ref 0–53)
AST: 16 U/L (ref 0–37)
Albumin: 3.6 g/dL (ref 3.5–5.2)
Alkaline Phosphatase: 83 U/L (ref 39–117)
Anion gap: 15 (ref 5–15)
BUN: 24 mg/dL — ABNORMAL HIGH (ref 6–23)
CALCIUM: 9.5 mg/dL (ref 8.4–10.5)
CO2: 19 meq/L (ref 19–32)
Chloride: 103 mEq/L (ref 96–112)
Creatinine, Ser: 1.14 mg/dL (ref 0.50–1.35)
GFR, EST AFRICAN AMERICAN: 64 mL/min — AB (ref >90)
GFR, EST NON AFRICAN AMERICAN: 55 mL/min — AB (ref >90)
GLUCOSE: 105 mg/dL — AB (ref 70–99)
Potassium: 3.9 mEq/L (ref 3.7–5.3)
Sodium: 137 mEq/L (ref 137–147)
Total Bilirubin: 0.5 mg/dL (ref 0.3–1.2)
Total Protein: 6.7 g/dL (ref 6.0–8.3)

## 2014-09-21 MED ORDER — LEVOTHYROXINE SODIUM 25 MCG PO TABS
25.0000 ug | ORAL_TABLET | Freq: Every day | ORAL | Status: DC
  Administered 2014-09-22 – 2014-09-24 (×3): 25 ug via ORAL
  Filled 2014-09-21 (×4): qty 1

## 2014-09-21 MED ORDER — CALCIUM CARB-CHOLECALCIFEROL 600-800 MG-UNIT PO TABS: 1.0000 | ORAL_TABLET | Freq: Every day | ORAL | Status: DC

## 2014-09-21 MED ORDER — WHITE PETROLATUM GEL
Status: DC | PRN
  Administered 2014-09-21: 15:00:00 via TOPICAL
  Filled 2014-09-21: qty 28.35

## 2014-09-21 MED ORDER — SPIRONOLACTONE 12.5 MG HALF TABLET
12.5000 mg | ORAL_TABLET | ORAL | Status: DC
  Administered 2014-09-22 – 2014-09-24 (×2): 12.5 mg via ORAL
  Filled 2014-09-21 (×2): qty 1

## 2014-09-21 MED ORDER — TERAZOSIN HCL 1 MG PO CAPS
1.0000 mg | ORAL_CAPSULE | Freq: Every day | ORAL | Status: DC
  Administered 2014-09-21 – 2014-09-23 (×3): 1 mg via ORAL
  Filled 2014-09-21 (×4): qty 1

## 2014-09-21 MED ORDER — FUROSEMIDE 20 MG PO TABS
20.0000 mg | ORAL_TABLET | Freq: Every morning | ORAL | Status: DC
  Administered 2014-09-22 – 2014-09-24 (×3): 20 mg via ORAL
  Filled 2014-09-21 (×3): qty 1

## 2014-09-21 MED ORDER — PANTOPRAZOLE SODIUM 40 MG PO TBEC
40.0000 mg | DELAYED_RELEASE_TABLET | Freq: Every day | ORAL | Status: DC
  Administered 2014-09-22 – 2014-09-24 (×3): 40 mg via ORAL
  Filled 2014-09-21 (×3): qty 1

## 2014-09-21 MED ORDER — FERROUS SULFATE 325 (65 FE) MG PO TABS
325.0000 mg | ORAL_TABLET | Freq: Every day | ORAL | Status: DC
  Administered 2014-09-22 – 2014-09-24 (×3): 325 mg via ORAL
  Filled 2014-09-21 (×4): qty 1

## 2014-09-21 MED ORDER — LOPERAMIDE HCL 2 MG PO CAPS
2.0000 mg | ORAL_CAPSULE | Freq: Once | ORAL | Status: AC
  Administered 2014-09-21: 2 mg via ORAL
  Filled 2014-09-21: qty 1

## 2014-09-21 MED ORDER — OXYBUTYNIN CHLORIDE 5 MG PO TABS
5.0000 mg | ORAL_TABLET | Freq: Two times a day (BID) | ORAL | Status: DC
  Administered 2014-09-21 – 2014-09-24 (×6): 5 mg via ORAL
  Filled 2014-09-21 (×7): qty 1

## 2014-09-21 MED ORDER — VITAMIN B-12 1000 MCG PO TABS
1000.0000 ug | ORAL_TABLET | Freq: Every day | ORAL | Status: DC
  Administered 2014-09-22 – 2014-09-24 (×3): 1000 ug via ORAL
  Filled 2014-09-21 (×3): qty 1

## 2014-09-21 MED ORDER — SODIUM CHLORIDE 0.9 % IJ SOLN
3.0000 mL | Freq: Two times a day (BID) | INTRAMUSCULAR | Status: DC
  Administered 2014-09-21 – 2014-09-24 (×6): 3 mL via INTRAVENOUS

## 2014-09-21 MED ORDER — ASPIRIN EC 81 MG PO TBEC
81.0000 mg | DELAYED_RELEASE_TABLET | Freq: Every day | ORAL | Status: DC
  Administered 2014-09-22 – 2014-09-24 (×3): 81 mg via ORAL
  Filled 2014-09-21 (×3): qty 1

## 2014-09-21 MED ORDER — TRAMADOL HCL 50 MG PO TABS
50.0000 mg | ORAL_TABLET | Freq: Two times a day (BID) | ORAL | Status: DC | PRN
  Administered 2014-09-21: 100 mg via ORAL
  Filled 2014-09-21: qty 2

## 2014-09-21 MED ORDER — LOSARTAN POTASSIUM 50 MG PO TABS
50.0000 mg | ORAL_TABLET | Freq: Every day | ORAL | Status: DC
Start: 2014-09-22 — End: 2014-09-24
  Administered 2014-09-22 – 2014-09-24 (×3): 50 mg via ORAL
  Filled 2014-09-21 (×3): qty 1

## 2014-09-21 MED ORDER — SODIUM CHLORIDE 0.9 % IV BOLUS (SEPSIS)
500.0000 mL | Freq: Once | INTRAVENOUS | Status: AC
  Administered 2014-09-21: 500 mL via INTRAVENOUS

## 2014-09-21 MED ORDER — PRAVASTATIN SODIUM 40 MG PO TABS
40.0000 mg | ORAL_TABLET | Freq: Every day | ORAL | Status: DC
  Administered 2014-09-22 – 2014-09-23 (×2): 40 mg via ORAL
  Filled 2014-09-21 (×3): qty 1

## 2014-09-21 MED ORDER — CALCIUM CARBONATE-VITAMIN D 500-200 MG-UNIT PO TABS
1.0000 | ORAL_TABLET | Freq: Every day | ORAL | Status: DC
  Administered 2014-09-22 – 2014-09-24 (×3): 1 via ORAL
  Filled 2014-09-21 (×4): qty 1

## 2014-09-21 MED ORDER — AMIODARONE HCL 200 MG PO TABS
200.0000 mg | ORAL_TABLET | Freq: Every day | ORAL | Status: DC
  Administered 2014-09-22 – 2014-09-24 (×3): 200 mg via ORAL
  Filled 2014-09-21 (×3): qty 1

## 2014-09-21 MED ORDER — ACETAMINOPHEN 325 MG PO TABS
650.0000 mg | ORAL_TABLET | Freq: Four times a day (QID) | ORAL | Status: DC | PRN
  Administered 2014-09-21: 650 mg via ORAL
  Filled 2014-09-21: qty 2

## 2014-09-21 MED ORDER — HEPARIN SODIUM (PORCINE) 5000 UNIT/ML IJ SOLN
5000.0000 [IU] | Freq: Three times a day (TID) | INTRAMUSCULAR | Status: DC
  Administered 2014-09-21 – 2014-09-24 (×8): 5000 [IU] via SUBCUTANEOUS
  Filled 2014-09-21 (×10): qty 1

## 2014-09-21 NOTE — ED Notes (Signed)
PA at bedside with this RN and tech

## 2014-09-21 NOTE — Progress Notes (Signed)
Patient ID: Eugene [name], male   DOB: 03-Dec-1923, 80 y.o.   MRN: [id number] Finally got in touch with caregiver, offered an pm appt, he states he decided to be proactive and is coming in the ED with pt at this time, he was offered the pm appt and refused stating so much more would be done in the ED, he was encouraged to call back or come to clinic if desired

## 2014-09-21 NOTE — ED Notes (Signed)
Admitting MD at bedside.

## 2014-09-21 NOTE — ED Provider Notes (Signed)
  Face-to-face evaluation   History: Patient presents for evaluation of diarrhea, and weakness.  Symptom onset several days ago.  Stool is brown, watery like.  He denies fever, chills, cough, shortness of breath or chest pain.  Physical exam: Elderly man, alert, calm, cooperative.  Heart regular rate and rhythm, no murmur.  Lungs clear to auscultation.  Abdomen no masses, soft and nontender to palpation.  Patient tolerated oral liquids in the emergency department.  As of 16:40, he has not produced a stool sample.  16:55- case discussed with physician on-call for his doctor, who will evaluate in the ED, for possible observation admission.  Medical screening examination/treatment/procedure(s) were conducted as a shared visit with non-physician practitioner(s) and myself.  I personally evaluated the patient during the encounter  Eugene MelterElliott L Myrtis Maille, MD 09/21/14 2056

## 2014-09-21 NOTE — ED Notes (Signed)
Patient states on Saturday he had irregular bp from low to normal.,  Patient states by 1800 he seemed to be normal again.  At 0430 he woke up with diarrhea.  He states he had a "gallon" of mess.  Patient states he continued to have diarrhea on Sunday.  Patient had ongoing sx Sunday, Monday and today.  Patient reports he is feeling weak.  He denies any abd pain.  Denies chest pain.  Denies sob.  Patient is alert.  He had heart valve surgery in November.  Patient unsure if he was on antibiotics.  Denies having diarrhea in hospital

## 2014-09-21 NOTE — ED Notes (Signed)
X 4 watery/loose green stools while in the ED

## 2014-09-21 NOTE — ED Provider Notes (Signed)
CSN: [id number]     Arrival date & time 09/21/14  1322 History   First MD Initiated Contact with Patient 09/21/14 1430     Chief Complaint  Patient presents with  . Diarrhea   Eugene [name] is a 80 y.o. male with history of cardiovascular disease, hypertension, GERD and valve replacement who presents to the emergency department complaining of diarrhea and weakness for the past 3 days. He denies abdominal pain. Patient reports large volume of watery stools over the past 3 days. Patient reports 5 episodes of diarrhea today. Patient also reports he vomited one time on Saturday but has had no vomiting or nausea since. Patient reported some positional lightheadedness when coming into the emergency department today that he feels much improved at my evaluation. Patient reports he's been eating and drinking well and his appetite has been normal. Patient reports he stopped using stool softener when he started having loose stools. The patient has attempted no treatments other than fluid replacement. Patient denies recent antibiotic use. Patient denies eating any strange foods. Patient denies any sick contacts. Patient denies blood or pus in his stools. Patient denies fevers, chills, abdominal pain, nausea, chest pain, shortness of breath, rashes or weakness.  (Consider location/radiation/quality/duration/timing/severity/associated sxs/prior Treatment) HPI  Past Medical History  Diagnosis Date  . Glaucoma     Right eye  . Diverticulosis     multiple hospital admissions for GI bleed, has an episode roughly every 6 months,, last  colonscopy in 2006 showed diverticulosis, was evaluated by Dr. Madilyn Fireman in the hospital in 2011 for lower GI bleed andd it  was suggested that the patient was not actively bleeding at that time and given his comorbid conditions colonoscopy was deferred. He is also chronically constipated  . Macular degeneration of right eye   . Allergic rhinitis   . CAD (coronary artery disease)      a. h/o remote inferior MI, h/o PCI to RCA in 1991. b. syncope 2013: severe AS and high grade prox LAD and other diffuse disease. High risk for CABG - s/p BMS to prox LAD and balloon aortic valvuloplasty. c. cath 07/16/14 - diffuse CAD, managed medically.  . Hypertension   . Hyperlipidemia   . Chronic combined systolic and diastolic CHF (congestive heart failure)     a. Most recent echo 08/2014: EF 30-35%.  . Supraventricular tachycardia     2 syndromes-nonsustained atrial tachycardia//adenosine responsive diuretic positive reentry probably AV node reentry  . Aortic stenosis     a. s/p AV balloon valvuloplasty by Dr. Excell Seltzer 11/2011. b. s/p TAVR 08/2014.  . Ischemic cardiomyopathy   . H/O: GI bleed     recurrent  . Hypothyroidism   . GERD (gastroesophageal reflux disease)   . Falls frequently     3 times in the past week/notes 09/11/2013  . Myocardial infarction 1991; 10/28/2011  . Arthritis     "might have slight; have trouble w/my fingers" (09/11/2013)  . PAF (paroxysmal atrial fibrillation)   . Peripheral vascular disease     a. h/o ulcer on the left great toe s/p angioplasty of the left anterior tibial artery.  . OSA on CPAP     since 89  . Limited mobility   . 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 PAT (paroxysmal atrial tachycardia)    Past Surgical History  Procedure Laterality Date  . Glaucoma surgery  Implantation of Baerveldt glaucoma device lant with scleral reinforcement using tutoplast tissue graft right eye. [Other]  . Coronary angioplasty with stent placement  Jan. 23, 2013  . Corneal transplant Right   . Angioplasty Left 03/30/2013    tibial  . Tonsillectomy  1946  . Cataract extraction w/ intraocular lens  implant, bilateral Bilateral   . Cardiac valve replacement  11/30/2011    aortic valve.?  . Transcatheter aortic valve replacement,  transfemoral N/A 08/10/2014    Procedure: TRANSCATHETER AORTIC VALVE REPLACEMENT, TRANSFEMORAL;  Surgeon: Tonny BollmanMichael Cooper, MD;  [facility];  Service: Open Heart Surgery;  Laterality: N/A;  . Tee without cardioversion N/A 08/10/2014    Procedure: TRANSESOPHAGEAL ECHOCARDIOGRAM (TEE);  Surgeon: Tonny BollmanMichael Cooper, MD;  [facility];  Service: Open Heart Surgery;  Laterality: N/A;  . Coronary angiogram  11/02/2011    Procedure: CORONARY ANGIOGRAM;  Surgeon: Rollene RotundaJames Hochrein, MD;  [facility];  Service: Cardiovascular;;  . Left heart catheterization with coronary angiogram N/A 11/12/2011    Procedure: LEFT HEART CATHETERIZATION WITH CORONARY ANGIOGRAM;  Surgeon: Tonny BollmanMichael Cooper, MD;  [facility];  Service: Cardiovascular;  Laterality: N/A;  . Percutaneous coronary stent intervention (pci-s) N/A 11/14/2011    Procedure: PERCUTANEOUS CORONARY STENT INTERVENTION (PCI-S);  Surgeon: Tonny BollmanMichael Cooper, MD;  [facility];  Service: Cardiovascular;  Laterality: N/A;  . Abdominal aortagram N/A 03/30/2013    Procedure: ABDOMINAL Ronny FlurryAORTAGRAM;  Surgeon: Chuck Hinthristopher S Dickson, MD;  [facility];  Service: Cardiovascular;  Laterality: N/A;  . Left and right heart catheterization with coronary angiogram N/A 07/16/2014    Procedure: LEFT AND RIGHT HEART CATHETERIZATION WITH CORONARY ANGIOGRAM;  Surgeon: Micheline ChapmanMichael D Cooper, MD;  [facility];  Service: Cardiovascular;  Laterality: N/A;   Family History  Problem Relation Age of Onset  . Cancer Mother     Ovarian Cancer  . Cancer Father     Lung  . Heart disease Sister     Heart disease before age 78   History  Substance Use Topics  . Smoking status: Former Smoker -- 3.00 packs/day for 21 years    Types: Cigarettes    Quit date: 10/08/1958  . Smokeless tobacco: Never Used  . Alcohol Use: No    Review of Systems  Constitutional: Negative for fever, chills and appetite change.  HENT: Negative for congestion, ear pain, sore  throat and trouble swallowing.   Eyes: Negative for pain and visual disturbance.  Respiratory: Negative for cough, shortness of breath and wheezing.   Cardiovascular: Negative for chest pain, palpitations and leg swelling.  Gastrointestinal: Positive for diarrhea. Negative for nausea, vomiting, abdominal pain, blood in stool and abdominal distention.  Genitourinary: Negative for dysuria, hematuria, flank pain and difficulty urinating.  Musculoskeletal: Negative for myalgias, back pain and neck pain.  Skin: Negative for rash and wound.  Neurological: Positive for weakness and light-headedness. Negative for dizziness, syncope, numbness and headaches.  All other systems reviewed and are negative.     Allergies  Penicillins and Corn-containing products  Home Medications   Prior to Admission medications   Medication Sig Start Date End Date Taking? Authorizing Provider  acetaminophen (TYLENOL) 500 MG tablet Take 1-2 tablets (500-1,000 mg total) by mouth every 6 (six) hours as needed for mild pain or moderate pain. 08/13/14   Dayna N Dunn, PA-C  amiodarone (PACERONE) 200 MG tablet Take 200 mg by mouth daily.    Historical Provider, MD  aspirin EC 81 MG tablet Take  81 mg by mouth daily.    Historical Provider, MD  Calcium Carb-Cholecalciferol (CALCIUM 600+D) 600-800 MG-UNIT TABS Take 1 tablet by mouth daily.    Historical Provider, MD  docusate sodium 100 MG CAPS Take 100 mg by mouth 2 (two) times daily as needed for mild constipation. 08/13/14   Dayna N Dunn, PA-C  ferrous sulfate 325 (65 FE) MG tablet Take 325 mg by mouth daily with breakfast.    Historical Provider, MD  furosemide (LASIX) 20 MG tablet Take 1 tablet (20 mg total) by mouth every morning. 12/24/13   Rollene Rotunda, MD  levothyroxine (SYNTHROID, LEVOTHROID) 25 MCG tablet TAKE 1 TABLET BY MOUTH EVERY DAY 08/16/14   Earl Lagos, MD  losartan (COZAAR) 50 MG tablet TAKE 1 TABLET (50 MG TOTAL) BY MOUTH DAILY. 08/30/14   Earl Lagos, MD  Multiple Vitamin (MULTIVITAMIN) tablet Take 1 tablet by mouth at bedtime.    Historical Provider, MD  mupirocin ointment (BACTROBAN) 2 % Place 1 application into the nose 2 (two) times daily. Through 08/14/14 08/13/14   Dayna N Dunn, PA-C  oxybutynin (DITROPAN) 5 MG tablet Take 5 mg by mouth 2 (two) times daily.    Historical Provider, MD  pantoprazole (PROTONIX) 40 MG tablet Take 40 mg by mouth daily.    Historical Provider, MD  pravastatin (PRAVACHOL) 40 MG tablet Take 1 tablet (40 mg total) by mouth daily. 06/17/14   Earl Lagos, MD  spironolactone (ALDACTONE) 25 MG tablet Take 12.5 mg by mouth every other day.     Historical Provider, MD  terazosin (HYTRIN) 1 MG capsule TAKE ONE CAPSULE BY MOUTH AT BEDTIME 09/20/14   Nischal Narendra, MD  traMADol (ULTRAM) 50 MG tablet Take 1-2 tablets (50-100 mg total) by mouth every 12 (twelve) hours as needed for moderate pain or severe pain. 08/13/14   Dayna N Dunn, PA-C  vitamin B-12 (CYANOCOBALAMIN) 1000 MCG tablet Take 1,000 mcg by mouth daily.    Historical Provider, MD   BP 120/74 mmHg  Pulse 63  Temp(Src) 98.9 F (37.2 C) (Rectal)  Resp 13  Ht 5\' 7"  (1.702 m)  Wt 184 lb (83.462 kg)  BMI 28.81 kg/m2  SpO2 95% Physical Exam  Constitutional: He is oriented to person, place, and time. He appears well-developed and well-nourished. No distress.  HENT:  Head: Normocephalic and atraumatic.  Nose: Nose normal.  Mouth/Throat: Oropharynx is clear and moist. No oropharyngeal exudate.  Eyes: Conjunctivae are normal. Pupils are equal, round, and reactive to light. Right eye exhibits no discharge. Left eye exhibits no discharge. No scleral icterus.  Neck: Neck supple.  Cardiovascular: Normal rate, regular rhythm, normal heart sounds and intact distal pulses.  Exam reveals no gallop and no friction rub.   No murmur heard. Pulmonary/Chest: Effort normal and breath sounds normal. No respiratory distress. He has no wheezes. He has no rales.   Abdominal: Soft. Bowel sounds are normal. He exhibits no distension and no mass. There is no tenderness. There is no rebound and no guarding.  Patient's abdomen is soft and tender to palpation. The right lower quadrant tenderness. No rebound tenderness. Negative rovsing sign.  Musculoskeletal: He exhibits no edema.  Lymphadenopathy:    He has no cervical adenopathy.  Neurological: He is alert and oriented to person, place, and time. Coordination normal.  Skin: Skin is warm and dry. No rash noted. He is not diaphoretic. No erythema. No pallor.  Psychiatric: He has a normal mood and affect. His behavior is normal.  Nursing  note and vitals reviewed.   ED Course  Procedures (including critical care time) Labs Review Labs Reviewed  COMPREHENSIVE METABOLIC PANEL - Abnormal; Notable for the following:    Glucose, Bld 105 (*)    BUN 24 (*)    GFR calc non Af Amer 55 (*)    GFR calc Af Amer 64 (*)    All other components within normal limits  URINALYSIS, ROUTINE W REFLEX MICROSCOPIC - Abnormal; Notable for the following:    Color, Urine AMBER (*)    Bilirubin Urine SMALL (*)    Ketones, ur 15 (*)    All other components within normal limits  STOOL CULTURE  CBC WITH DIFFERENTIAL  LIPASE, BLOOD  PROTIME-INR    Imaging Review No results found.   EKG Interpretation   Date/Time:  Tuesday September 21 2014 13:53:43 EST Ventricular Rate:  86 PR Interval:  248 QRS Duration: 128 QT Interval:  402 QTC Calculation: 481 R Axis:   -64 Text Interpretation:  Sinus rhythm with 1st degree A-V block with  Premature atrial complexes Left axis deviation Left ventricular  hypertrophy with QRS widening and repolarization abnormality Abnormal ECG  since last tracing no significant change Confirmed by WENTZ  MD, ELLIOTT  [phone]([id number]) on 09/21/2014 4:02:20 PM      Filed Vitals:   09/21/14 1515 09/21/14 1530 09/21/14 1545 09/21/14 1600  BP: 136/59 149/77 138/74 120/74  Pulse: 76 62 64 63  Temp:       TempSrc:      Resp: 23 16 17 13   Height:      Weight:      SpO2: 92% 95% 95% 95%     MDM   Meds given in ED:  Medications  white petrolatum (VASELINE) gel ( Topical Given 09/21/14 1515)  sodium chloride 0.9 % bolus 500 mL (0 mLs Intravenous Stopped 09/21/14 1508)  loperamide (IMODIUM) capsule 2 mg (2 mg Oral Given 09/21/14 1509)  sodium chloride 0.9 % bolus 500 mL (0 mLs Intravenous Stopped 09/21/14 1607)    New Prescriptions   No medications on file    Final diagnoses:  Diarrhea  Weakness    Barrie Dunkerfird N Deeg is a 80 y.o. male with history of cardiovascular disease, hypertension, GERD and valve replacement who presents to the emergency department complaining of diarrhea and weakness for the past 3 days. Patient reports 5 large-volume stools today. Patient denies abdominal pain. Patient denies fevers or chills. Patient denies recent antibiotic use. Patient is afebrile and nontoxic-appearing. Patient's abdomen is soft and nontender to palpation. Patient's initial blood pressure was hypotensive at 92/53. The patient's blood pressure quickly improved with a fluid bolus. Patient reports he is improved with the fluid bolus and loperamide.  Due to this patient's age and medical history the patient would benefit from an overnight stay in the hospital to ensure his improvement and resolution of diarrhea. Patient is in agreement with staying overnight. Dr. Effie ShyWentz consulted for admission. This patient was accepted for admission.   This patient was discussed with and evaluated by Dr. Effie ShyWentz who agrees with assessment and plan.    Lawana ChambersWilliam Duncan Dajana Gehrig, PA-C 09/21/14 1858  Flint MelterElliott L Wentz, MD 09/21/14 [phone]

## 2014-09-21 NOTE — Telephone Encounter (Signed)
Pt's caregiver calls and lm stating pt has diarrhea several times daily since sat, at this time he thinks pt needs to be seen, attempted to call but had to leave message

## 2014-09-21 NOTE — ED Notes (Signed)
Pt given crackers and water to drink and eat

## 2014-09-21 NOTE — H&P (Signed)
Date: 09/21/2014               Patient Name:  Eugene [name] MRN: [id number]  DOB: [birth year] Age / Sex: 80 y.o., male   PCP: Earl LagosNischal Narendra, MD         Medical Service: Internal Medicine Teaching Service         Attending Physician: Dr. Earl LagosNischal Narendra    First Contact: Dr. Isabella BowensKrall Pager: [phone]  Second Contact: Dr. Andrey CampanileWilson Pager: [phone]       After Hours (After 5p/  First Contact Pager: [phone]  weekends / holidays): Second Contact Pager: [phone]   Chief Complaint: diarrhea  History of Present Illness: Mr. Eugene [name] is an 80 year old man with history of diverticulosis, HTN, HLD, CAD s/p multiple PCI, systolic and diastolic CHF, paroxysmal a fib, aortic stenosis s/p TAVR 08/10/2014, hypothyroidism, GERD presenting with diarrhea x 3 days. Reports watery stools. He had 5 episodes of diarrhea in last 24 hours. Some lightheadedness on standing prior to arrival but denies any since. Denies blood in stools. Tried a bland diet with no relief. He stopped his docusate on Saturday. Denies fevers, chills, SOB, CP, N/V, abdominal pain, dysuria, hematuria. Denies sick contacts, recent travel, well water. Denies foods that may have started this. Denies history of c diff.   Meds: Current Facility-Administered Medications  Medication Dose Route Frequency Provider Last Rate Last Dose  . white petrolatum (VASELINE) gel   Topical PRN Lawana ChambersWilliam Duncan Dansie, PA-C       Current Outpatient Prescriptions  Medication Sig Dispense Refill  . acetaminophen (TYLENOL) 500 MG tablet Take 1-2 tablets (500-1,000 mg total) by mouth every 6 (six) hours as needed for mild pain or moderate pain.    Marland Kitchen. amiodarone (PACERONE) 200 MG tablet Take 200 mg by mouth daily.    Marland Kitchen. aspirin EC 81 MG tablet Take 81 mg by mouth daily.    . Calcium Carb-Cholecalciferol (CALCIUM 600+D) 600-800 MG-UNIT TABS Take 1 tablet by mouth daily.    Marland Kitchen. docusate sodium 100 MG CAPS Take 100 mg by mouth 2 (two) times daily as needed for mild  constipation.    . ferrous sulfate 325 (65 FE) MG tablet Take 325 mg by mouth daily with breakfast.    . furosemide (LASIX) 20 MG tablet Take 1 tablet (20 mg total) by mouth every morning. 30 tablet 6  . levothyroxine (SYNTHROID, LEVOTHROID) 25 MCG tablet TAKE 1 TABLET BY MOUTH EVERY DAY 30 tablet 3  . losartan (COZAAR) 50 MG tablet TAKE 1 TABLET (50 MG TOTAL) BY MOUTH DAILY. 30 tablet 1  . Multiple Vitamin (MULTIVITAMIN) tablet Take 1 tablet by mouth at bedtime.    . mupirocin ointment (BACTROBAN) 2 % Place 1 application into the nose 2 (two) times daily. Through 08/14/14    . oxybutynin (DITROPAN) 5 MG tablet Take 5 mg by mouth 2 (two) times daily.    . pantoprazole (PROTONIX) 40 MG tablet Take 40 mg by mouth daily.    . pravastatin (PRAVACHOL) 40 MG tablet Take 1 tablet (40 mg total) by mouth daily. 90 tablet 4  . spironolactone (ALDACTONE) 25 MG tablet Take 12.5 mg by mouth every other day.     . terazosin (HYTRIN) 1 MG capsule TAKE ONE CAPSULE BY MOUTH AT BEDTIME 30 capsule 2  . traMADol (ULTRAM) 50 MG tablet Take 1-2 tablets (50-100 mg total) by mouth every 12 (twelve) hours as needed for moderate pain or severe pain.    . vitamin B-12 (  CYANOCOBALAMIN) 1000 MCG tablet Take 1,000 mcg by mouth daily.      Allergies: Allergies as of 09/21/2014 - Review Complete 09/21/2014  Allergen Reaction Noted  . Penicillins Swelling and Other (See Comments)   . Corn-containing products Other (See Comments) 08/06/2014   Past Medical History  Diagnosis Date  . Glaucoma     Right eye  . Diverticulosis     multiple hospital admissions for GI bleed, has an episode roughly every 6 months,, last  colonscopy in 2006 showed diverticulosis, was evaluated by Dr. Madilyn Fireman in the hospital in 2011 for lower GI bleed andd it  was suggested that the patient was not actively bleeding at that time and given his comorbid conditions colonoscopy was deferred. He is also chronically constipated  . Macular degeneration of  right eye   . Allergic rhinitis   . CAD (coronary artery disease)     a. h/o remote inferior MI, h/o PCI to RCA in 1991. b. syncope 2013: severe AS and high grade prox LAD and other diffuse disease. High risk for CABG - s/p BMS to prox LAD and balloon aortic valvuloplasty. c. cath 07/16/14 - diffuse CAD, managed medically.  . Hypertension   . Hyperlipidemia   . Chronic combined systolic and diastolic CHF (congestive heart failure)     a. Most recent echo 08/2014: EF 30-35%.  . Supraventricular tachycardia     2 syndromes-nonsustained atrial tachycardia//adenosine responsive diuretic positive reentry probably AV node reentry  . Aortic stenosis     a. s/p AV balloon valvuloplasty by Dr. Excell Seltzer 11/2011. b. s/p TAVR 08/2014.  . Ischemic cardiomyopathy   . H/O: GI bleed     recurrent  . Hypothyroidism   . GERD (gastroesophageal reflux disease)   . Falls frequently     3 times in the past week/notes 09/11/2013  . Myocardial infarction 1991; 10/28/2011  . Arthritis     "might have slight; have trouble w/my fingers" (09/11/2013)  . PAF (paroxysmal atrial fibrillation)   . Peripheral vascular disease     a. h/o ulcer on the left great toe s/p angioplasty of the left anterior tibial artery.  . OSA on CPAP     since 89  . Limited mobility   . 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 PAT (paroxysmal atrial tachycardia)    Past Surgical History  Procedure Laterality Date  . Glaucoma surgery      Implantation of Baerveldt glaucoma device lant with scleral reinforcement using tutoplast tissue graft right eye. [Other]  . Coronary angioplasty with stent placement  Jan. 23, 2013  . Corneal transplant Right   . Angioplasty Left 03/30/2013    tibial  . Tonsillectomy  1946  . Cataract extraction w/ intraocular lens  implant, bilateral Bilateral   . Cardiac valve replacement  11/30/2011      aortic valve.?  . Transcatheter aortic valve replacement, transfemoral N/A 08/10/2014    Procedure: TRANSCATHETER AORTIC VALVE REPLACEMENT, TRANSFEMORAL;  Surgeon: Tonny Bollman, MD;  Location: [facility] OR;  Service: Open Heart Surgery;  Laterality: N/A;  . Tee without cardioversion N/A 08/10/2014    Procedure: TRANSESOPHAGEAL ECHOCARDIOGRAM (TEE);  Surgeon: Tonny Bollman, MD;  Location: [facility] OR;  Service: Open Heart Surgery;  Laterality: N/A;  . Coronary angiogram  11/02/2011    Procedure: CORONARY ANGIOGRAM;  Surgeon: Rollene Rotunda, MD;  Location: [facility] CATH LAB;  Service: Cardiovascular;;  .  Left heart catheterization with coronary angiogram N/A 11/12/2011    Procedure: LEFT HEART CATHETERIZATION WITH CORONARY ANGIOGRAM;  Surgeon: Tonny BollmanMichael Cooper, MD;  [facility];  Service: Cardiovascular;  Laterality: N/A;  . Percutaneous coronary stent intervention (pci-s) N/A 11/14/2011    Procedure: PERCUTANEOUS CORONARY STENT INTERVENTION (PCI-S);  Surgeon: Tonny BollmanMichael Cooper, MD;  [facility];  Service: Cardiovascular;  Laterality: N/A;  . Abdominal aortagram N/A 03/30/2013    Procedure: ABDOMINAL Ronny FlurryAORTAGRAM;  Surgeon: Chuck Hinthristopher S Dickson, MD;  [facility];  Service: Cardiovascular;  Laterality: N/A;  . Left and right heart catheterization with coronary angiogram N/A 07/16/2014    Procedure: LEFT AND RIGHT HEART CATHETERIZATION WITH CORONARY ANGIOGRAM;  Surgeon: Micheline ChapmanMichael D Cooper, MD;  [facility];  Service: Cardiovascular;  Laterality: N/A;   Family History  Problem Relation Age of Onset  . Cancer Mother     Ovarian Cancer  . Cancer Father     Lung  . Heart disease Sister     Heart disease before age 78   History   Social History  . Marital Status: Widowed    Spouse Name: N/A    Number of Children: N/A  . Years of Education: N/A   Occupational History  . Not on file.   Social History Main Topics  . Smoking status: Former Smoker -- 3.00 packs/day for 21 years     Types: Cigarettes    Quit date: 10/08/1958  . Smokeless tobacco: Never Used  . Alcohol Use: No  . Drug Use: No  . Sexual Activity: No   Other Topics Concern  . Not on file   Social History Narrative   [facility]. His pastor is his HCPOA  Clint Guy([name]) [phone]). His daugther has been removed because she use $500k from his estate without his approval.  He also has a close friend who takes him for his doctor's appointments and is with him during the hospital admission. 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 Has retired 13 years ago. Never smoked or drank alcohol in past 40 years. No illicit drug use. Has Medicare.    Review of Systems: Constitutional: no fevers/chills Eyes: no vision changes Ears, nose, mouth, throat, and face: no cough Respiratory: no shortness of breath Cardiovascular: no chest pain Gastrointestinal: no nausea/vomiting, no abdominal pain, no constipation, +diarrhea Genitourinary: no dysuria, no hematuria Integument: no rash Hematologic/lymphatic: no bleeding/bruising, no edema Musculoskeletal: no arthralgias, no myalgias Neurological: no paresthesias, +generalized weakness   Physical Exam: Blood pressure 120/74, pulse 63, temperature 98.9 F (37.2 C), temperature source Rectal, resp. rate 13, height 5\' 7"  (1.702 m), weight 184 lb (83.462 kg), SpO2 95 %. General Apperance: NAD Head: Normocephalic, atraumatic Eyes: PERRL, EOMI, anicteric sclera Ears: Normal external ear canal Nose: Nares normal, septum midline, mucosa normal Throat: Lips, mucosa and tongue normal  Neck: Supple, trachea midline Back: No tenderness or bony abnormality  Lungs: Clear to auscultation bilaterally. No wheezes, rhonchi or rales. Breathing comfortably Chest Wall: Nontender, no deformity Heart: Regular rate and rhythm, no murmur/rub/gallop Abdomen: Soft, nontender, nondistended, no rebound/guarding Extremities: Normal,  atraumatic, warm and well perfused, no edema Pulses: 2+ throughout Skin: No rashes or lesions Neurologic: Alert and oriented x 3. CNII-XII intact. Normal strength and sensation   Lab results: Basic Metabolic Panel:  Recent Labs  78/46/9612/15/15 1345  NA 137  K 3.9  CL 103  CO2 19  GLUCOSE 105*  BUN  24*  CREATININE 1.14  CALCIUM 9.5   Liver Function Tests:  Recent Labs  09/21/14 1345  AST 16  ALT 9  ALKPHOS 83  BILITOT 0.5  PROT 6.7  ALBUMIN 3.6    Recent Labs  09/21/14 1345  LIPASE 13   CBC:  Recent Labs  09/21/14 1345  WBC 7.1  NEUTROABS 5.1  HGB 15.1  HCT 46.1  MCV 95.1  PLT 180   Coagulation:  Recent Labs  09/21/14 1300  LABPROT 13.7  INR 1.04   Urinalysis:  Recent Labs  09/21/14 1453  COLORURINE AMBER*  LABSPEC 1.028  PHURINE 5.0  GLUCOSEU NEGATIVE  HGBUR NEGATIVE  BILIRUBINUR SMALL*  KETONESUR 15*  PROTEINUR NEGATIVE  UROBILINOGEN 0.2  NITRITE NEGATIVE  LEUKOCYTESUR NEGATIVE   Other results: EKG: Normal sinus rhythm. 1st degree AV block (PR ). T wave inversion aVL. Unchanged from previous EKG.  Assessment & Plan by Problem: Active Problems:   Diarrhea  Diarrhea: Elderly patient with acute onset of past 3 days. Likely infectious - viral vs bacterial and less likely protozoa. No fever or peritoneal signs. Recently hospitalized s/p TAVR 08/10/2014. Denies blood in stool. Stool culture sent by ED. -Admit for observation -Send C Diff PCR -follow up stool culture -Orthostatic vital signs -PT/OT  HTN: normotensive -Continue home losartan 50mg  daily -Continue home spironolactone 12.5mg  QOD  CAD s/p multiple PCI, HLD: EKG without any acute changes -continue home ASA 81mg  daily -Continue home pravastatin 40mg  daily  Systolic and diastolic CHF: Last echo 08/11/2014 with LV EF 30-35% with diffuse hypokinesis and grade 1 diastolic dysfunction -Continue home PO Lasix 20mg  daily  Paroxysmal a fib: NSR on EKG today. -continue home  amiodarone 200mg  daily  Hypothyroidism: Last TSH 04/07/2014 4 -Continue home synthroid daily  GERD  -continue home Protonix 40mg  daily  BPH -continue home terazosin 1mg  QHS -continue home oxybutynin 5mg  BID  FEN:  -Heart healthy diet -Continue home calcium carb cholecalciferol -Continue ferrous sulfate 325mg  daily -Continue home vitamin B12 daily  DVT ppx: subq heparin 5000u TID  Dispo: Disposition is deferred at this time, awaiting improvement of current medical problems. Anticipated discharge in approximately 1 day(s).   The patient does have a current PCP (Earl Lagos, MD) and does need an [facility] hospital follow-up appointment after discharge.  The patient does not have transportation limitations that hinder transportation to clinic appointments.  Signed: Griffin Basil, MD 09/21/2014, 5:08 PM

## 2014-09-21 NOTE — ED Notes (Signed)
Patient is now reporting dizziness

## 2014-09-22 ENCOUNTER — Encounter (HOSPITAL_COMMUNITY): Payer: Self-pay | Admitting: General Practice

## 2014-09-22 DIAGNOSIS — R197 Diarrhea, unspecified: Secondary | ICD-10-CM

## 2014-09-22 LAB — CLOSTRIDIUM DIFFICILE BY PCR: Toxigenic C. Difficile by PCR: NEGATIVE

## 2014-09-22 LAB — CBC
HEMATOCRIT: 41.8 % (ref 39.0–52.0)
Hemoglobin: 13.8 g/dL (ref 13.0–17.0)
MCH: 31.4 pg (ref 26.0–34.0)
MCHC: 33 g/dL (ref 30.0–36.0)
MCV: 95.2 fL (ref 78.0–100.0)
Platelets: 165 10*3/uL (ref 150–400)
RBC: 4.39 MIL/uL (ref 4.22–5.81)
RDW: 14.7 % (ref 11.5–15.5)
WBC: 7.2 10*3/uL (ref 4.0–10.5)

## 2014-09-22 LAB — BASIC METABOLIC PANEL
Anion gap: 13 (ref 5–15)
BUN: 22 mg/dL (ref 6–23)
CALCIUM: 9.2 mg/dL (ref 8.4–10.5)
CO2: 20 mEq/L (ref 19–32)
CREATININE: 1.09 mg/dL (ref 0.50–1.35)
Chloride: 103 mEq/L (ref 96–112)
GFR calc Af Amer: 68 mL/min — ABNORMAL LOW (ref >90)
GFR, EST NON AFRICAN AMERICAN: 59 mL/min — AB (ref >90)
Glucose, Bld: 87 mg/dL (ref 70–99)
Potassium: 3.7 mEq/L (ref 3.7–5.3)
Sodium: 136 mEq/L — ABNORMAL LOW (ref 137–147)

## 2014-09-22 MED ORDER — ONDANSETRON HCL 4 MG/2ML IJ SOLN
4.0000 mg | Freq: Once | INTRAMUSCULAR | Status: AC
  Administered 2014-09-22: 4 mg via INTRAVENOUS
  Filled 2014-09-22: qty 2

## 2014-09-22 MED ORDER — ONDANSETRON HCL 4 MG/2ML IJ SOLN: 4.0000 mg | Freq: Once | INTRAMUSCULAR | Status: DC

## 2014-09-22 MED ORDER — LOPERAMIDE HCL 2 MG PO CAPS
4.0000 mg | ORAL_CAPSULE | Freq: Once | ORAL | Status: AC
  Administered 2014-09-22: 4 mg via ORAL
  Filled 2014-09-22 (×2): qty 2

## 2014-09-22 MED ORDER — LOPERAMIDE HCL 2 MG PO CAPS
2.0000 mg | ORAL_CAPSULE | ORAL | Status: DC | PRN
  Administered 2014-09-23 (×2): 2 mg via ORAL

## 2014-09-22 NOTE — Care Management Note (Addendum)
    Page 1 of 2   09/24/2014     2:37:59 PM CARE MANAGEMENT NOTE 09/24/2014  Patient:  Eugene [name],Eugene [name]   Account Number:  [id number]  Date Initiated:  09/22/2014  Documentation initiated by:  Donn PieriniWEBSTER,KRISTI  Subjective/Objective Assessment:   Pt admitted with diarrhea     Action/Plan:   PTA pt lived at home- per pt [facility]-does not recall name   Anticipated DC Date:  09/23/2014   Anticipated DC Plan:  HOME W HOME HEALTH SERVICES      DC Planning Services  CM consult      [facility]  Resumption Of Svcs/PTA Provider   Choice offered to / List presented to:  C-1 Patient        HH arranged  HH-2 PT  HH-1 RN      [facility]   Status of service:  Completed, signed off Medicare Important Message given?   (If response is "NO", the following Medicare IM given date fields will be blank) Date Medicare IM given:   Medicare IM given by:   Date Additional Medicare IM given:   Additional Medicare IM given by:    Discharge Disposition:  HOME W HOME HEALTH SERVICES  Per UR Regulation:  Reviewed for med. necessity/level of care/duration of stay  If discussed at Long Length of Stay Meetings, dates discussed:    Comments:  09/24/14 1421 Lafawn Lenoir RN MSN BSN CCM PT recommending SNF but pt refuses to consider same, agrees to return home with home health services.  [facility], they will restart RN and PT services.  Pt [place] his [place] and will be able to pick him up after dinner.  09/23/14- 0830- Donn PieriniKristi Webster RN, BSN [phone] Heard from rep with [facility]- [facility]- let her know that pt had resumption order for HH-PT- and possible d/c for today. No further needs. [facility]  09/22/14- 1600- Donn PieriniKristi Webster RN, BSN [phone] Order for HH-PT- spoke with pt who states that he has HH-PT already- does not remember agency name- but is agreeable to  continued services- will attempt to find out which agancy pt is active with so that order can be given to continue services.

## 2014-09-22 NOTE — Evaluation (Signed)
Occupational Therapy Evaluation Patient Details Name: Eugene Dunkerfird N Perezgarcia MRN: [id number] DOB: Oct 16, 1923 Today's Date: 09/22/2014    History of Present Illness Mr. Eugene [name] is an 80 year old man with history of diverticulosis, HTN, HLD, CAD s/p multiple PCI, systolic and diastolic CHF, paroxysmal a fib, aortic stenosis s/p TAVR 08/10/2014, hypothyroidism, GERD presenting with diarrhea x 3 days   Clinical Impression   Pt admitted with above. Feel pt will benefit from acute OT to increase independence with BADLs as well as increase strength prior to d/c. Talked with pt's roommate about OT recommending someone be with pt 24/7.    Follow Up Recommendations  Home health OT;Supervision/Assistance - 24 hour    Equipment Recommendations  None recommended by OT    Recommendations for Other Services       Precautions / Restrictions Precautions Precautions: Fall Restrictions Weight Bearing Restrictions: No      Mobility Bed Mobility               General bed mobility comments: not assessed  Transfers Overall transfer level: Needs assistance Equipment used: Rolling walker (2 wheeled) Transfers: Sit to/from Stand Sit to Stand: Mod assist;Min guard         General transfer comment: Assist to boost up from recliner chair. Min guard for sit to stand from 3 in 1.    Balance                                            ADL Overall ADL's : Needs assistance/impaired     Grooming: Wash/dry face;Oral care;Applying deodorant;Standing;Minimal assistance       Lower Body Bathing: Min guard;Sit to/from stand   Upper Body Dressing : Minimal assistance;Sitting   Lower Body Dressing: Minimal assistance;Sit to/from stand   Toilet Transfer: Minimal assistance;Ambulation;RW;BSC           Functional mobility during ADLs: Minimal assistance;Rolling walker General ADL Comments: Discussed how button up shirts may be easier to manage for pt. Educated on safety  (use of bag on walker, safe shoewear, sitting for LB dressing). Recommending 24/7 assist/supervision and discussed this with pt's roommate in room and told him recommending HHOT. Pt stood at sink and performed ADLs-took a few rest breaks. Pt incontient of BM, so washed off/changed gown.  Cues for breathing technique. OT assisted some with spraying cleaner on washcloth as pt using UE's for support on sink. Discussed tub transfer technique using tub bench with pt's roommate.     Vision                     Perception     Praxis      Pertinent Vitals/Pain Pain Assessment: No/denies pain     Hand Dominance Right   Extremity/Trunk Assessment Upper Extremity Assessment Upper Extremity Assessment: RUE deficits/detail;LUE deficits/detail RUE Deficits / Details: limted ROM in shoulder LUE Deficits / Details: limited ROM in shoulders   Lower Extremity Assessment Lower Extremity Assessment: Defer to PT evaluation       Communication Communication Communication: HOH   Cognition Arousal/Alertness: Awake/alert Behavior During Therapy: WFL for tasks assessed/performed Overall Cognitive Status:  (unsure of baseline) Area of Impairment: Problem solving;Safety/judgement         Safety/Judgement: Decreased awareness of safety   Problem Solving: Slow processing;Requires verbal cues General Comments:  on one occassion did look like he was going to put  deodorant on buttocks.   General Comments       Exercises       Shoulder Instructions      Home Living Family/patient expects to be discharged to:: Private residence Living Arrangements: Non-relatives/Friends Available Help at Discharge: Friend(s);Available 24 hours/day Type of Home: House Home Access: Ramped entrance Entrance Stairs-Number of Steps: 3 Entrance Stairs-Rails: Can reach both Home Layout: Two level Alternate Level Stairs-Number of Steps: 4 Alternate Level Stairs-Rails: Right;Left Bathroom Shower/Tub:  Chief Strategy OfficerTub/shower unit   Bathroom Toilet: Standard (counter close)     Home Equipment: Environmental consultantWalker - 2 wheels;Walker - standard;Shower seat;Bedside commode;Adaptive equipment Adaptive Equipment: Long-handled sponge        Prior Functioning/Environment Level of Independence: Needs assistance    ADL's / Homemaking Assistance Needed: assist with tub transfer and bathing; friend assists with dressing because it takes pt a while        OT Diagnosis: Generalized weakness   OT Problem List: Decreased strength;Decreased activity tolerance;Impaired balance (sitting and/or standing);Decreased knowledge of use of DME or AE;Decreased knowledge of precautions;Impaired UE functional use;Decreased range of motion   OT Treatment/Interventions: DME and/or AE instruction;Therapeutic activities;Patient/family education;Balance training;Self-care/ADL training;Cognitive remediation/compensation;Therapeutic exercise    OT Goals(Current goals can be found in the care plan section) Acute Rehab OT Goals Patient Stated Goal: not stated OT Goal Formulation: With patient Time For Goal Achievement: 09/29/14 Potential to Achieve Goals: Good ADL Goals Pt Will Perform Grooming: standing;with supervision;with set-up Pt Will Perform Lower Body Bathing: with set-up;with supervision;sit to/from stand Pt Will Perform Lower Body Dressing: with set-up;with supervision;sit to/from stand Pt Will Transfer to Toilet: with supervision;ambulating Pt Will Perform Toileting - Clothing Manipulation and hygiene: with supervision;sit to/from stand  OT Frequency: Min 2X/week   Barriers to D/C:            Co-evaluation              End of Session Equipment Utilized During Treatment: Gait belt;Rolling walker  Activity Tolerance: Patient limited by fatigue Patient left: in chair;with call bell/phone within reach;with family/visitor present   Time: [id number] OT Time Calculation (min): 25 min Charges:  OT General Charges $OT  Visit: 1 Procedure OT Evaluation $Initial OT Evaluation Tier I: 1 Procedure OT Treatments $Self Care/Home Management : 8-22 mins G-Codes: OT G-codes **NOT FOR INPATIENT CLASS** Functional Assessment Tool Used: clinical judgment Functional Limitation: Self care Self Care Current Status ([id number]): At least 20 percent but less than 40 percent impaired, limited or restricted Self Care Goal Status ([id number]): At least 1 percent but less than 20 percent impaired, limited or restricted  Chris Narasimhan, Ardeen JourdainLindsey L 09/22/2014, 6:26 PM

## 2014-09-22 NOTE — Evaluation (Signed)
Physical Therapy Evaluation Patient Details Name: Eugene [name] MRN: [id number] DOB: [birth year] Today's Date: 09/22/2014   History of Present Illness  Mr. Eugene Dunkerfird N Gramajo is an 80 year old man with history of diverticulosis, HTN, HLD, CAD s/p multiple PCI, systolic and diastolic CHF, paroxysmal a fib, aortic stenosis s/p TAVR 08/10/2014, hypothyroidism, GERD presenting with diarrhea x 3 days  Clinical Impression  Pt admitted with above diagnosis. Pt currently with functional limitations due to the deficits listed below (see PT Problem List). Pt will benefit from skilled PT to increase their independence and safety with mobility to allow discharge to the venue listed below.  Pt limited today by diarrhea.  Nursing reports he is being tested for c-diff.     Follow Up Recommendations Home health PT    Equipment Recommendations  None recommended by PT    Recommendations for Other Services       Precautions / Restrictions        Mobility  Bed Mobility   Bed Mobility: Supine to Sit     Supine to sit: HOB elevated;Supervision     General bed mobility comments: Pt reports he sleeps in recliner, so left HOB elevated  Transfers Overall transfer level: Needs assistance Equipment used: Rolling walker (2 wheeled) Transfers: Sit to/from [employer] CorporationStand;Stand Pivot Transfers Sit to Stand: Min guard Stand pivot transfers: Min guard       General transfer comment: poor use of hands despite cueing to reach back.  Ambulation/Gait             General Gait Details: due to diarrhea, pt unable to ambulate or perform standing marching  Stairs            Wheelchair Mobility    Modified Rankin (Stroke Patients Only)       Balance   Sitting-balance support: Bilateral upper extremity supported Sitting balance-Leahy Scale: Fair     Standing balance support: Bilateral upper extremity supported Standing balance-Leahy Scale: Fair Standing balance comment: with use of RW                              Pertinent Vitals/Pain Pain Assessment: No/denies pain    Home Living Family/patient expects to be discharged to:: Private residence Living Arrangements: Non-relatives/Friends Available Help at Discharge: Friend(s);Available 24 hours/day Type of Home: House Home Access: Ramped entrance     Home Layout: Two level Home Equipment: Walker - 2 wheels;Walker - standard;Shower seat Additional Comments: Uses SW upstairs and has RW for downstairs and community ambulation    Prior Function Level of Independence: Independent with assistive device(s)   Gait / Transfers Assistance Needed: S with community distances           Hand Dominance   Dominant Hand: Right    Extremity/Trunk Assessment   Upper Extremity Assessment: Defer to OT evaluation           Lower Extremity Assessment: Generalized weakness         Communication   Communication: HOH  Cognition Arousal/Alertness: Awake/alert Behavior During Therapy: WFL for tasks assessed/performed Overall Cognitive Status: Within Functional Limits for tasks assessed Area of Impairment: Orientation                    General Comments General comments (skin integrity, edema, etc.): Pt was very happy to get OOB and get to recliner.  Would have ambulated had it not been for fear of diarrhea.    Exercises  Assessment/Plan    PT Assessment Patient needs continued PT services  PT Diagnosis Generalized weakness;Difficulty walking   PT Problem List Decreased strength;Decreased activity tolerance  PT Treatment Interventions DME instruction;Gait training;Stair training;Functional mobility training;Therapeutic activities;Therapeutic exercise;Patient/family education   PT Goals (Current goals can be found in the Care Plan section) Acute Rehab PT Goals Patient Stated Goal: to go home  PT Goal Formulation: With patient Time For Goal Achievement: 10/06/14 Potential to Achieve Goals:  Good    Frequency Min 3X/week   Barriers to discharge        Co-evaluation               End of Session Equipment Utilized During Treatment: Gait belt Activity Tolerance: Patient tolerated treatment well;Other (comment) (limited by diarrhea) Patient left: in chair;with call bell/phone within reach Nurse Communication: Mobility status    Functional Limitation: Mobility: Walking and moving around Mobility: Walking and Moving Around Current Status ([id number]): At least 20 percent but less than 40 percent impaired, limited or restricted Mobility: Walking and Moving Around Goal Status [phone]): At least 1 percent but less than 20 percent impaired, limited or restricted    Time: [id number] PT Time Calculation (min) (ACUTE ONLY): 26 min   Charges:   PT Evaluation $Initial PT Evaluation Tier I: 1 Procedure PT Treatments $Therapeutic Activity: 8-22 mins   PT G Codes:     Functional Limitation: Mobility: Walking and moving around    [facility] CenterMITH,Sabra Sessler LUBECK 09/22/2014, 9:38 AM

## 2014-09-22 NOTE — Progress Notes (Signed)
UR completed 

## 2014-09-22 NOTE — Progress Notes (Signed)
Subjective: Reports feeling better overall. Diarrhea improving. Denies N/V or abdominal pain.  Objective: Vital signs in last 24 hours: Filed Vitals:   09/21/14 2115 09/21/14 2145 09/21/14 2229 09/22/14 0513  BP: 151/56 118/57 151/59 123/59  Pulse: 78 72 62 61  Temp:   97.6 F (36.4 C) 97.6 F (36.4 C)  TempSrc:   Oral Oral  Resp: 18  18 18   Height:      Weight:   184 lb 15.5 oz (83.9 kg) 185 lb 3 oz (84 kg)  SpO2:   96% 98%   Weight change:   Intake/Output Summary (Last 24 hours) at 09/22/14 1449 Last data filed at 09/22/14 0730  Gross per 24 hour  Intake    240 ml  Output    405 ml  Net   -165 ml   General Apperance: NAD HEENT: Normocephalic, atraumatic, PERRL, EOMI, anicteric sclera Neck: Supple, trachea midline Lungs: Clear to auscultation bilaterally. No wheezes, rhonchi or rales. Breathing comfortably Heart: Regular rate and rhythm, no murmur/rub/gallop Abdomen: Soft, nontender, nondistended, no rebound/guarding Extremities: Normal, atraumatic, warm and well perfused, no edema Pulses: 2+ throughout Neurologic: Alert and oriented x 3. CNII-XII intact. Normal strength and sensation  Lab Results: Basic Metabolic Panel:  Recent Labs Lab 09/21/14 1345 09/22/14 0240  NA 137 136*  K 3.9 3.7  CL 103 103  CO2 19 20  GLUCOSE 105* 87  BUN 24* 22  CREATININE 1.14 1.09  CALCIUM 9.5 9.2   Liver Function Tests:  Recent Labs Lab 09/21/14 1345  AST 16  ALT 9  ALKPHOS 83  BILITOT 0.5  PROT 6.7  ALBUMIN 3.6    Recent Labs Lab 09/21/14 1345  LIPASE 13   CBC:  Recent Labs Lab 09/21/14 1345 09/22/14 0240  WBC 7.1 7.2  NEUTROABS 5.1  --   HGB 15.1 13.8  HCT 46.1 41.8  MCV 95.1 95.2  PLT 180 165   Coagulation:  Recent Labs Lab 09/21/14 1300  LABPROT 13.7  INR 1.04   Urine Drug Screen: Drugs of Abuse     Component Value Date/Time   LABOPIA NONE DETECTED 11/23/2010 0646   COCAINSCRNUR NONE DETECTED 11/23/2010 0646   LABBENZ NONE  DETECTED 11/23/2010 0646   AMPHETMU NONE DETECTED 11/23/2010 0646   THCU NONE DETECTED 11/23/2010 0646   LABBARB  11/23/2010 0646    NONE DETECTED        DRUG SCREEN FOR MEDICAL PURPOSES ONLY.  IF CONFIRMATION IS NEEDED FOR ANY PURPOSE, NOTIFY LAB WITHIN 5 DAYS.        LOWEST DETECTABLE LIMITS FOR URINE DRUG SCREEN Drug Class       Cutoff (ng/mL) Amphetamine      1000 Barbiturate      200 Benzodiazepine   200 Tricyclics       300 Opiates          300 Cocaine          300 THC              50     Urinalysis:  Recent Labs Lab 09/21/14 1453  COLORURINE AMBER*  LABSPEC 1.028  PHURINE 5.0  GLUCOSEU NEGATIVE  HGBUR NEGATIVE  BILIRUBINUR SMALL*  KETONESUR 15*  PROTEINUR NEGATIVE  UROBILINOGEN 0.2  NITRITE NEGATIVE  LEUKOCYTESUR NEGATIVE    Micro Results: Recent Results (from the past 240 hour(s))  Clostridium Difficile by PCR     Status: None   Collection Time: 09/21/14  6:29 PM  Result Value Ref Range  Status   C difficile by pcr NEGATIVE NEGATIVE Final   Studies/Results: No results found. Medications: I have reviewed the patient's current medications. Scheduled Meds: . amiodarone  200 mg Oral Daily  . aspirin EC  81 mg Oral Daily  . calcium-vitamin D  1 tablet Oral Q breakfast  . ferrous sulfate  325 mg Oral Q breakfast  . furosemide  20 mg Oral q morning - 10a  . heparin  5,000 Units Subcutaneous 3 times per day  . levothyroxine  25 mcg Oral QAC breakfast  . losartan  50 mg Oral Daily  . oxybutynin  5 mg Oral BID  . pantoprazole  40 mg Oral Daily  . pravastatin  40 mg Oral q1800  . sodium chloride  3 mL Intravenous Q12H  . spironolactone  12.5 mg Oral QODAY  . terazosin  1 mg Oral QHS  . vitamin B-12  1,000 mcg Oral Daily   Continuous Infusions:  PRN Meds:.acetaminophen, traMADol, white petrolatum Assessment/Plan: Active Problems:   Diarrhea  Diarrhea: C diff negative. Likely 2/2 acute viral diarrhea. PT recommending home health PT.  -follow up  stool culture sent by ED -OT eval pending  HTN: normotensive -Continue home losartan 50mg  daily -Continue home spironolactone 12.5mg  QOD  CAD s/p multiple PCI, HLD: EKG without any acute changes -continue home ASA 81mg  daily -Continue home pravastatin 40mg  daily  Systolic and diastolic CHF: Last echo 08/11/2014 with LV EF 30-35% with diffuse hypokinesis and grade 1 diastolic dysfunction -Continue home PO Lasix 20mg  daily  Paroxysmal a fib: NSR on EKG today. -continue home amiodarone 200mg  daily  Hypothyroidism: Last TSH 04/07/2014 4 -Continue home synthroid 25mcg daily  GERD  -continue home Protonix 40mg  daily  BPH -continue home terazosin 1mg  QHS -continue home oxybutynin 5mg  BID  FEN:  -Heart healthy diet -Continue home calcium carb cholecalciferol -Continue ferrous sulfate 325mg  daily -Continue home vitamin B12 1000mcg daily  DVT ppx: subq heparin 5000u TID  Dispo: Likely home today after OT eval.  The patient does have a current PCP (Earl LagosNischal Narendra, MD) and [facility].  The patient does not have transportation limitations that hinder transportation to clinic appointments.  .Services Needed at time of discharge: Y = Yes, Blank = No PT:   OT:   RN:   Equipment:   Other:     LOS: 1 day   Griffin BasilJennifer Karalyn Kadel, MD 09/22/2014, 2:49 PM

## 2014-09-22 NOTE — Progress Notes (Signed)
Pt seen and examined with Dr. Isabella BowensKrall. Case d/w residents on rounds in detail and plan was formulated.  In brief, pt is a 80 y/o male with PMH of diverticulosis, HTN, HLD, CAD s/p multiple stents, chronic combined CHF, pAfib, AS s/p TAVR who p/w diarrhea * 3 days. Pt states that he has had multiple episodes of diarrhea over the last 3 days- watery, no melena/hematochezia. No fevers/chills, no n/v, no abd pain, no dysuria. Pt denies sick contacts/travel. Today patient states he had only 1 episode of diarrhea and feels much improved. Remaining ROS negative  Physical exam: Gen: AAO*3, NAD CVS: RRR, normal heart sounds Pulm: CTA b/l Abd: soft, non tender, BS + Ext: no edema  Assessment and Plan: 80 y/o male with extensive PMH p/w diarrhea * 3 days - Likely gastroenteritis  Diarrhea: - Likely infectious (mostly viral). F/u stool cx (sent by ED) - Cdiff negative - Diarrhea improving now - likely resolving.  - Pt tolerating PO diet  HTN:  - c/w ARB, spirinolactone - BP well controlled  Pt [facility] home today [facility]

## 2014-09-22 NOTE — Discharge Instructions (Signed)
Diarrhea °Diarrhea is watery poop (stool). It can make you feel weak, tired, thirsty, or give you a dry mouth (signs of dehydration). Watery poop is a sign of another problem, most often an infection. It often lasts 2-3 days. It can last longer if it is a sign of something serious. Take care of yourself as told by your doctor. °HOME CARE  °· Drink 1 cup (8 ounces) of fluid each time you have watery poop. °· Do not drink the following fluids: °¨ Those that contain simple sugars (fructose, glucose, galactose, lactose, sucrose, maltose). °¨ Sports drinks. °¨ Fruit juices. °¨ Whole milk products. °¨ Sodas. °¨ Drinks with caffeine (coffee, tea, soda) or alcohol. °· Oral rehydration solution may be used if the doctor says it is okay. You may make your own solution. Follow this recipe: °¨  - teaspoon table salt. °¨ ¾ teaspoon baking soda. °¨  teaspoon salt substitute containing potassium chloride. °¨ 1 tablespoons sugar. °¨ 1 liter (34 ounces) of water. °· Avoid the following foods: °¨ High fiber foods, such as raw fruits and vegetables. °¨ Nuts, seeds, and whole grain breads and cereals. °¨  Those that are sweetened with sugar alcohols (xylitol, sorbitol, mannitol). °· Try eating the following foods: °¨ Starchy foods, such as rice, toast, pasta, low-sugar cereal, oatmeal, baked potatoes, crackers, and bagels. °¨ Bananas. °¨ Applesauce. °· Eat probiotic-rich foods, such as yogurt and milk products that are fermented. °· Wash your hands well after each time you have watery poop. °· Only take medicine as told by your doctor. °· Take a warm bath to help lessen burning or pain from having watery poop. °GET HELP RIGHT AWAY IF:  °· You cannot drink fluids without throwing up (vomiting). °· You keep throwing up. °· You have blood in your poop, or your poop looks black and tarry. °· You do not pee (urinate) in 6-8 hours, or there is only a small amount of very dark pee. °· You have belly (abdominal) pain that gets worse or stays  in the same spot (localizes). °· You are weak, dizzy, confused, or light-headed. °· You have a very bad headache. °· Your watery poop gets worse or does not get better. °· You have a fever or lasting symptoms for more than 2-3 days. °· You have a fever and your symptoms suddenly get worse. °MAKE SURE YOU:  °· Understand these instructions. °· Will watch your condition. °· Will get help right away if you are not doing well or get worse. °Document Released: 03/12/2008 Document Revised: 02/08/2014 Document Reviewed: 06/01/2012 °Exit[employer]® Patient Information ©2015 [employer]. This information is not intended to replace advice given to you by your health care provider. Make sure you discuss any questions you have with your health care provider. ° °

## 2014-09-23 DIAGNOSIS — I10 Essential (primary) hypertension: Secondary | ICD-10-CM

## 2014-09-23 DIAGNOSIS — R197 Diarrhea, unspecified: Secondary | ICD-10-CM | POA: Diagnosis not present

## 2014-09-23 MED ORDER — LOPERAMIDE HCL 2 MG PO CAPS: 2.0000 mg | ORAL_CAPSULE | ORAL | Status: DC | PRN

## 2014-09-23 NOTE — Progress Notes (Signed)
Subjective: Reports feeling better overall. Diarrhea improving - had one episode last night and one this morning. He reports the stools are becoming more formed. One episode of emesis last night but denies N/V this morning. Also denies abdominal pain.  Objective: Vital signs in last 24 hours: Filed Vitals:   09/22/14 0513 09/22/14 1528 09/22/14 1947 09/23/14 0500  BP: 123/59 113/54 146/61 124/47  Pulse: 61 61 66 62  Temp: 97.6 F (36.4 C) 97.6 F (36.4 C) 97.8 F (36.6 C) 98 F (36.7 C)  TempSrc: Oral Oral Oral Oral  Resp: 18 18 18 18   Height:      Weight: 185 lb 3 oz (84 kg)   195 lb 1.7 oz (88.5 kg)  SpO2: 98% 98% 96% 93%   Weight change: 11 lb 1.7 oz (5.038 kg)  Intake/Output Summary (Last 24 hours) at 09/23/14 1321 Last data filed at 09/23/14 0800  Gross per 24 hour  Intake    240 ml  Output    250 ml  Net    -10 ml   General Apperance: NAD HEENT: Normocephalic, atraumatic, PERRL, EOMI, anicteric sclera Neck: Supple, trachea midline Lungs: Clear to auscultation bilaterally. No wheezes, rhonchi or rales. Breathing comfortably Heart: Regular rate and rhythm, no murmur/rub/gallop Abdomen: Soft, nontender, nondistended, no rebound/guarding Extremities: Normal, atraumatic, warm and well perfused, no edema Pulses: 2+ throughout Neurologic: Alert and oriented x 3. CNII-XII intact. Normal strength and sensation  Lab Results: Basic Metabolic Panel:  Recent Labs Lab 09/21/14 1345 09/22/14 0240  NA 137 136*  K 3.9 3.7  CL 103 103  CO2 19 20  GLUCOSE 105* 87  BUN 24* 22  CREATININE 1.14 1.09  CALCIUM 9.5 9.2   Liver Function Tests:  Recent Labs Lab 09/21/14 1345  AST 16  ALT 9  ALKPHOS 83  BILITOT 0.5  PROT 6.7  ALBUMIN 3.6    Recent Labs Lab 09/21/14 1345  LIPASE 13   CBC:  Recent Labs Lab 09/21/14 1345 09/22/14 0240  WBC 7.1 7.2  NEUTROABS 5.1  --   HGB 15.1 13.8  HCT 46.1 41.8  MCV 95.1 95.2  PLT 180 165   Coagulation:  Recent  Labs Lab 09/21/14 1300  LABPROT 13.7  INR 1.04   Urine Drug Screen: Drugs of Abuse     Component Value Date/Time   LABOPIA NONE DETECTED 11/23/2010 0646   COCAINSCRNUR NONE DETECTED 11/23/2010 0646   LABBENZ NONE DETECTED 11/23/2010 0646   AMPHETMU NONE DETECTED 11/23/2010 0646   THCU NONE DETECTED 11/23/2010 0646   LABBARB  11/23/2010 0646    NONE DETECTED        DRUG SCREEN FOR MEDICAL PURPOSES ONLY.  IF CONFIRMATION IS NEEDED FOR ANY PURPOSE, NOTIFY LAB WITHIN 5 DAYS.        LOWEST DETECTABLE LIMITS FOR URINE DRUG SCREEN Drug Class       Cutoff (ng/mL) Amphetamine      1000 Barbiturate      200 Benzodiazepine   200 Tricyclics       300 Opiates          300 Cocaine          300 THC              50     Urinalysis:  Recent Labs Lab 09/21/14 1453  COLORURINE AMBER*  LABSPEC 1.028  PHURINE 5.0  GLUCOSEU NEGATIVE  HGBUR NEGATIVE  BILIRUBINUR SMALL*  KETONESUR 15*  PROTEINUR NEGATIVE  UROBILINOGEN 0.2  NITRITE  NEGATIVE  LEUKOCYTESUR NEGATIVE    Micro Results: Recent Results (from the past 240 hour(s))  Stool culture     Status: None (Preliminary result)   Collection Time: 09/21/14  5:38 PM  Result Value Ref Range Status   Specimen Description STOOL  Final   Special Requests Normal  Final   Culture   Final    NO SUSPICIOUS COLONIES, CONTINUING TO HOLD Performed at [facility]    Report Status PENDING  Incomplete  Clostridium Difficile by PCR     Status: None   Collection Time: 09/21/14  6:29 PM  Result Value Ref Range Status   C difficile by pcr NEGATIVE NEGATIVE Final   Studies/Results: No results found. Medications: I have reviewed the patient's current medications. Scheduled Meds: . amiodarone  200 mg Oral Daily  . aspirin EC  81 mg Oral Daily  . calcium-vitamin D  1 tablet Oral Q breakfast  . ferrous sulfate  325 mg Oral Q breakfast  . furosemide  20 mg Oral q morning - 10a  . heparin  5,000 Units Subcutaneous 3 times per day  .  levothyroxine  25 mcg Oral QAC breakfast  . losartan  50 mg Oral Daily  . oxybutynin  5 mg Oral BID  . pantoprazole  40 mg Oral Daily  . pravastatin  40 mg Oral q1800  . sodium chloride  3 mL Intravenous Q12H  . spironolactone  12.5 mg Oral QODAY  . terazosin  1 mg Oral QHS  . vitamin B-12  1,000 mcg Oral Daily   Continuous Infusions:  PRN Meds:.acetaminophen, loperamide, traMADol, white petrolatum Assessment/Plan: Active Problems:   Diarrhea  Diarrhea: C diff negative. Likely 2/2 acute viral diarrhea. PT recommending home health PT. OT recommending home health OT. Stool culture with no suspicious colonies. -Immodium 2mg  prn diarrhea  HTN: normotensive -Continue home losartan 50mg  daily -Continue home spironolactone 12.5mg  QOD  CAD s/p multiple PCI, HLD: EKG without any acute changes -continue home ASA 81mg  daily -Continue home pravastatin 40mg  daily  Systolic and diastolic CHF: Last echo 08/11/2014 with LV EF 30-35% with diffuse hypokinesis and grade 1 diastolic dysfunction -Continue home PO Lasix 20mg  daily  Paroxysmal a fib: NSR on EKG today. -continue home amiodarone 200mg  daily  Hypothyroidism: Last TSH 04/07/2014 4 -Continue home synthroid 25mcg daily  GERD  -continue home Protonix 40mg  daily  BPH -continue home terazosin 1mg  QHS -continue home oxybutynin 5mg  BID  FEN:  -Heart healthy diet -Continue home calcium carb cholecalciferol -Continue ferrous sulfate 325mg  daily -Continue home vitamin B12 1000mcg daily  DVT ppx: subq heparin 5000u TID  Dispo: Likely home today if tolerating PO.  The patient does have a current PCP (Earl LagosNischal Narendra, MD) and [facility] after discharge.  The patient does not have transportation limitations that hinder transportation to clinic appointments.  .Services Needed at time of discharge: Y = Yes, Blank = No PT:   OT:   RN:   Equipment:   Other:     LOS: 2 days   Griffin BasilJennifer Tuesday Terlecki,  MD 09/23/2014, 1:21 PM

## 2014-09-23 NOTE — Progress Notes (Signed)
Pt seen and examined with Dr. Isabella BowensKrall. Case d/w residents on rounds in detail and plan was formulated.  Pt feels better today. Had 1 BM this AM which was more formed. No abd pain. Pt still with mild nausea but tolerating PO  Physical exam: Gen: AAO*3, NAD CVS: RRR, normal heart sounds Pulm: CTA b/l Abd: soft, non tender, BS + Ext: no edema  Assessment and Plan: 80 y/o male with extensive PMH p/w diarrhea * 3 days - Likely gastroenteritis  Diarrhea: - Likely infectious (mostly viral). F/u stool cx - negative till date - Cdiff negative - Diarrhea improving. C/w immodium prn - Pt tolerating PO diet  HTN:  - c/w ARB, spirinolactone - BP well controlled  Pt likely d/c home today [facility]

## 2014-09-23 NOTE — Progress Notes (Signed)
UR completed 

## 2014-09-23 NOTE — Progress Notes (Signed)
PT Cancellation Note  Patient Details Name: Eugene [name] N Bartok MRN: [id number] DOB: 05/07/1924   Cancelled Treatment:    Reason Eval/Treat Not Completed: Medical issues which prohibited therapy Pt reports he is unable to work with therapy at this time due to frequent bouts of loose bowel incontinence. Requests PT follow up at a later time. Will follow up as time allows. Likely tomorrow (12/18).   [address]Edvin Albus [place], [place]PT [phone]  [place], Bransen Fassnacht S 09/23/2014, 1:53 PM

## 2014-09-24 DIAGNOSIS — R197 Diarrhea, unspecified: Secondary | ICD-10-CM | POA: Diagnosis not present

## 2014-09-24 NOTE — Clinical Social Work Note (Signed)
CSW received referral for SNF.  Case discussed with case manager per patient request plan is to discharge home with home health.  CSW to sign off please re-consult if social work needs arise.  Ervin KnackEric R. Bristyl Mclees, MSW, [employer]CSWA [phone]

## 2014-09-24 NOTE — Progress Notes (Signed)
Pt seen and examined with Dr. Isabella BowensKrall. Case d/w residents on rounds in detail and plan was formulated.  Pt feels better today. No episodes of diarrhea this AM and had a semi formed BM last night. No fevers  Physical exam: Gen: AAO*3, NAD CVS: RRR, normal heart sounds Pulm: CTA b/l Abd: soft, non tender, BS + Ext: no edema  Assessment and Plan: 80 y/o male with extensive PMH p/w diarrhea * 3 days - Likely gastroenteritis  Diarrhea: - Likely infectious (mostly viral). F/u stool cx - negative till date - Cdiff negative - Diarrhea improving. C/w immodium prn - Pt tolerating PO diet  HTN:  - c/w ARB, spirinolactone - BP well controlled  Pt seen by PT who recommended SNF but patient refuses SNF when we spoke to him. Will consider d/c home with home PT today

## 2014-09-24 NOTE — Progress Notes (Signed)
Discharge instructions reviewed with POA. Rev. [name] verbalized understanding of all medications and all new orders. All questions answered. [facility]. Bradley FerrisBrandie Raenette Sakata RN BSN 09/24/2014 6:26 PM

## 2014-09-24 NOTE — Progress Notes (Signed)
Subjective: Patient reports that his diarrhea has improved again. He has not had any episodes since yesterday evening. Patient denying any other complaints this morning. He is agreeable with a potential discharge today.   Objective: Vital signs in last 24 hours: Filed Vitals:   09/23/14 0500 09/23/14 1336 09/23/14 2132 09/24/14 0608  BP: 124/47 142/55 123/58 151/63  Pulse: 62 59 63 67  Temp: 98 F (36.7 C) 97.5 F (36.4 C) 97.9 F (36.6 C) 97.7 F (36.5 C)  TempSrc: Oral Oral Oral Oral  Resp: 18 18 20 18   Height:      Weight: 195 lb 1.7 oz (88.5 kg)   197 lb 8.5 oz (89.6 kg)  SpO2: 93% 96% 92% 94%   Weight change: 2 lb 6.8 oz (1.1 kg)  Intake/Output Summary (Last 24 hours) at 09/24/14 0931 Last data filed at 09/24/14 0438  Gross per 24 hour  Intake    250 ml  Output    370 ml  Net   -120 ml   General Apperance: NAD HEENT: Normocephalic, atraumatic, PERRL, EOMI, anicteric sclera Neck: Supple, trachea midline Lungs: Clear to auscultation bilaterally. No wheezes, rhonchi or rales. Breathing comfortably Heart: Regular rate and rhythm, no murmur/rub/gallop Abdomen: Soft, nontender, nondistended, no rebound/guarding Extremities: Normal, atraumatic, warm and well perfused, no edema Pulses: 2+ throughout Neurologic: Alert and oriented x 3. CNII-XII intact. Normal strength and sensation  Lab Results: Basic Metabolic Panel:  Recent Labs Lab 09/21/14 1345 09/22/14 0240  NA 137 136*  K 3.9 3.7  CL 103 103  CO2 19 20  GLUCOSE 105* 87  BUN 24* 22  CREATININE 1.14 1.09  CALCIUM 9.5 9.2   Liver Function Tests:  Recent Labs Lab 09/21/14 1345  AST 16  ALT 9  ALKPHOS 83  BILITOT 0.5  PROT 6.7  ALBUMIN 3.6    Recent Labs Lab 09/21/14 1345  LIPASE 13   CBC:  Recent Labs Lab 09/21/14 1345 09/22/14 0240  WBC 7.1 7.2  NEUTROABS 5.1  --   HGB 15.1 13.8  HCT 46.1 41.8  MCV 95.1 95.2  PLT 180 165   Coagulation:  Recent Labs Lab 09/21/14 1300    LABPROT 13.7  INR 1.04   Urine Drug Screen: Drugs of Abuse     Component Value Date/Time   LABOPIA NONE DETECTED 11/23/2010 0646   COCAINSCRNUR NONE DETECTED 11/23/2010 0646   LABBENZ NONE DETECTED 11/23/2010 0646   AMPHETMU NONE DETECTED 11/23/2010 0646   THCU NONE DETECTED 11/23/2010 0646   LABBARB  11/23/2010 0646    NONE DETECTED        DRUG SCREEN FOR MEDICAL PURPOSES ONLY.  IF CONFIRMATION IS NEEDED FOR ANY PURPOSE, NOTIFY LAB WITHIN 5 DAYS.        LOWEST DETECTABLE LIMITS FOR URINE DRUG SCREEN Drug Class       Cutoff (ng/mL) Amphetamine      1000 Barbiturate      200 Benzodiazepine   200 Tricyclics       300 Opiates          300 Cocaine          300 THC              50     Urinalysis:  Recent Labs Lab 09/21/14 1453  COLORURINE AMBER*  LABSPEC 1.028  PHURINE 5.0  GLUCOSEU NEGATIVE  HGBUR NEGATIVE  BILIRUBINUR SMALL*  KETONESUR 15*  PROTEINUR NEGATIVE  UROBILINOGEN 0.2  NITRITE NEGATIVE  LEUKOCYTESUR NEGATIVE  Micro Results: Recent Results (from the past 240 hour(s))  Stool culture     Status: None (Preliminary result)   Collection Time: 09/21/14  5:38 PM  Result Value Ref Range Status   Specimen Description STOOL  Final   Special Requests Normal  Final   Culture   Final    NO SUSPICIOUS COLONIES, CONTINUING TO HOLD Performed at [facility]    Report Status PENDING  Incomplete  Clostridium Difficile by PCR     Status: None   Collection Time: 09/21/14  6:29 PM  Result Value Ref Range Status   C difficile by pcr NEGATIVE NEGATIVE Final   Studies/Results: No results found. Medications: I have reviewed the patient's current medications. Scheduled Meds: . amiodarone  200 mg Oral Daily  . aspirin EC  81 mg Oral Daily  . calcium-vitamin D  1 tablet Oral Q breakfast  . ferrous sulfate  325 mg Oral Q breakfast  . furosemide  20 mg Oral q morning - 10a  . heparin  5,000 Units Subcutaneous 3 times per day  . levothyroxine  25 mcg Oral  QAC breakfast  . losartan  50 mg Oral Daily  . oxybutynin  5 mg Oral BID  . pantoprazole  40 mg Oral Daily  . pravastatin  40 mg Oral q1800  . sodium chloride  3 mL Intravenous Q12H  . spironolactone  12.5 mg Oral QODAY  . terazosin  1 mg Oral QHS  . vitamin B-12  1,000 mcg Oral Daily   Continuous Infusions:  PRN Meds:.acetaminophen, loperamide, traMADol, white petrolatum Assessment/Plan: Active Problems:   Diarrhea  Diarrhea: C diff negative. Likely 2/2 acute viral diarrhea. PT recommending SNF but patient prefers d/c back to home. OT recommending home health OT. Stool culture with no suspicious colonies. Patient reports improvement today with no loose bowel movements since yesterday.  -Immodium 2mg  prn diarrhea (two doses yesterday)  HTN: normotensive -Continue home losartan 50mg  daily -Continue home spironolactone 12.5mg  QOD  CAD s/p multiple PCI, HLD:  -continue home ASA 81mg  daily -Continue home pravastatin 40mg  daily  Systolic and diastolic CHF: Last echo 08/11/2014 with LV EF 30-35% with diffuse hypokinesis and grade 1 diastolic dysfunction -Continue home PO Lasix 20mg  daily  Paroxysmal a fib:  -continue home amiodarone 200mg  daily  Hypothyroidism: Last TSH 04/07/2014 4 -Continue home synthroid 25mcg daily  GERD  -continue home Protonix 40mg  daily  BPH -continue home terazosin 1mg  QHS -continue home oxybutynin 5mg  BID  FEN:  -Heart healthy diet -Continue home calcium carb cholecalciferol -Continue ferrous sulfate 325mg  daily -Continue home vitamin B12 1000mcg daily  DVT ppx: subq heparin 5000u TID  Dispo: Likely home today if tolerating PO.  The patient does have a current PCP (Earl LagosNischal Narendra, MD) and [facility].  The patient does not have transportation limitations that hinder transportation to clinic appointments.  .Services Needed at time of discharge: Y = Yes, Blank = No PT:   OT:   RN:     Equipment:   Other:     LOS: 3 days   Griffin BasilJennifer Dailyn Kempner, MD 09/24/2014, 9:31 AM

## 2014-09-24 NOTE — Progress Notes (Signed)
Physical Therapy Treatment Patient Details Name: Eugene [name] MRN: [id number] DOB: [birth year] Today'[name] Date: 09/24/2014    History of Present Illness Eugene [name] is an 80 year old man with history of diverticulosis, HTN, HLD, CAD [name]/p multiple PCI, systolic and diastolic CHF, paroxysmal a fib, aortic stenosis [name]/p TAVR 08/10/2014, hypothyroidism, GERD presenting with diarrhea x 3 days    PT Comments    Patient progressing slowly towards physical therapy goals. Able to tolerate short distance of ambulation with min assist and max verbal cues. Pt with significant LE buckling. Currently requires min assist- min guard for transfers out of chair and BSC. Updated discharge recommendation to SNF due to decrease in functional ability. If pt refuses SNF, will require HHPT and as many resources as possible in home for safety due to high fall risk. Patient will continue to benefit from skilled physical therapy services to further improve independence with functional mobility.   Follow Up Recommendations  SNF     Equipment Recommendations  None recommended by PT    Recommendations for Other Services       Precautions / Restrictions Precautions Precautions: Fall Restrictions Weight Bearing Restrictions: No    Mobility  Bed Mobility                  Transfers Overall transfer level: Needs assistance Equipment used: Rolling walker (2 wheeled) Transfers: Sit to/from Stand Sit to Stand: Min assist Stand pivot transfers: Min assist       General transfer comment: Min assist for boost to stand x4 from recliner and BSC. Progressed to min guard assist. Frequent knee buckling initially. Very poor control with descent into chair. Max cues for technique and safety.  Ambulation/Gait Ambulation/Gait assistance: Min assist Ambulation Distance (Feet): 8 Feet Assistive device: Rolling walker (2 wheeled) Gait Pattern/deviations: Decreased step length - right;Shuffle;Trunk flexed Gait  velocity: decreased   General Gait Details: Educated on safe DME use. VC for upright posture. Pt with bil LE buckling. Practiced pre-gait marching. Only able to tolerate standing for <1 minute at a time. Min assist for walker control. Max cues for sequencing and technique.   Stairs            Wheelchair Mobility    Modified Rankin (Stroke Patients Only)       Balance Overall balance assessment: Needs assistance Sitting-balance support: No upper extremity supported;Feet supported Sitting balance-Leahy Scale: Fair     Standing balance support: Bilateral upper extremity supported Standing balance-Leahy Scale: Poor                      Cognition Arousal/Alertness: Awake/alert Behavior During Therapy: Agitated Overall Cognitive Status: Within Functional Limits for tasks assessed (unsure of baseline) Area of Impairment: Problem solving;Safety/judgement         Safety/Judgement: Decreased awareness of safety   Problem Solving: Slow processing;Requires verbal cues      Exercises      General Comments General comments (skin integrity, edema, etc.): Significant LE buckling upon standing. Pt not safe with ambulation or transfers.      Pertinent Vitals/Pain Pain Assessment: No/denies pain    Home Living                      Prior Function            PT Goals (current goals can now be found in the care plan section) Acute Rehab PT Goals PT Goal Formulation: With patient Time For Goal Achievement:  10/06/14 Potential to Achieve Goals: Good Progress towards PT goals: Progressing toward goals    Frequency  Min 3X/week    PT Plan Discharge plan needs to be updated    Co-evaluation             End of Session Equipment Utilized During Treatment: Gait belt Activity Tolerance: Patient limited by fatigue Patient left: in chair;with call bell/phone within reach     Time: 0900-0911 PT Time Calculation (min) (ACUTE ONLY): 11  min  Charges:  $Therapeutic Activity: 8-22 mins                    G Codes:      Eugene [name], Eugene [name] 09/24/2014, 9:22 AM Eugene [name] Eugene [name], [place] [phone]

## 2014-09-24 NOTE — Discharge Summary (Signed)
Name: Eugene [name] MRN: [id number] DOB: Oct 24, 1923 80 y.o. PCP: Aldine Contes, MD  Date of Admission: 09/21/2014  2:18 PM Date of Discharge: 09/24/2014 Attending Physician: Aldine Contes, MD  Discharge Diagnosis: Active Problems:   Diarrhea  Discharge Medications:   Medication List    TAKE these medications        acetaminophen 500 MG tablet  Commonly known as:  TYLENOL  Take 1-2 tablets (500-1,000 mg total) by mouth every 6 (six) hours as needed for mild pain or moderate pain.     amiodarone 200 MG tablet  Commonly known as:  PACERONE  Take 200 mg by mouth daily.     aspirin EC 81 MG tablet  Take 81 mg by mouth daily.     CALCIUM 600+D 600-800 MG-UNIT Tabs  Generic drug:  Calcium Carb-Cholecalciferol  Take 1 tablet by mouth daily.     DSS 100 MG Caps  Take 100 mg by mouth 2 (two) times daily as needed for mild constipation.     ferrous sulfate 325 (65 FE) MG tablet  Take 325 mg by mouth daily with breakfast.     furosemide 20 MG tablet  Commonly known as:  LASIX  Take 1 tablet (20 mg total) by mouth every morning.     levothyroxine 25 MCG tablet  Commonly known as:  SYNTHROID, LEVOTHROID  TAKE 1 TABLET BY MOUTH EVERY DAY     loperamide 2 MG capsule  Commonly known as:  IMODIUM  Take 1 capsule (2 mg total) by mouth as needed for diarrhea or loose stools. Do not take more than 6 capsules in 24 hours.     losartan 50 MG tablet  Commonly known as:  COZAAR  TAKE 1 TABLET (50 MG TOTAL) BY MOUTH DAILY.     multivitamin tablet  Take 1 tablet by mouth at bedtime.     mupirocin ointment 2 %  Commonly known as:  BACTROBAN  Place 1 application into the nose 2 (two) times daily. Through 08/14/14     oxybutynin 5 MG tablet  Commonly known as:  DITROPAN  Take 5 mg by mouth 2 (two) times daily.     pantoprazole 40 MG tablet  Commonly known as:  PROTONIX  Take 40 mg by mouth daily.     pravastatin 40 MG tablet  Commonly known as:  PRAVACHOL  Take 1  tablet (40 mg total) by mouth daily.     spironolactone 25 MG tablet  Commonly known as:  ALDACTONE  Take 12.5 mg by mouth every other day.     terazosin 1 MG capsule  Commonly known as:  HYTRIN  TAKE ONE CAPSULE BY MOUTH AT BEDTIME     traMADol 50 MG tablet  Commonly known as:  ULTRAM  Take 1-2 tablets (50-100 mg total) by mouth every 12 (twelve) hours as needed for moderate pain or severe pain.     vitamin B-12 1000 MCG tablet  Commonly known as:  CYANOCOBALAMIN  Take 1,000 mcg by mouth daily.        Disposition and follow-up:   [name] was discharged from [facility] in Stable condition.  At the hospital follow up visit please address:  1.  Diarrhea: check for resolution of diarrhea.  2.  Labs / imaging needed at time of follow-up: None  3.  Pending labs/ test needing follow-up: None  Follow-up Appointments:     Follow-up Information    Follow up with Eugene Bellows,  MD On 09/27/2014.   Specialty:  Internal Medicine   Why:  9:15 am for hospital follow up   Contact information:   [address] [place] [address] [phone]       Follow up with [facility].   Specialty:  Home Health Services   Why:  [facility] resumed PT   Contact information:   [address] [place] [address] [phone]       Discharge Instructions: Discharge Instructions    Call MD for:  difficulty breathing, headache or visual disturbances    Complete by:  As directed      Call MD for:  persistant dizziness or light-headedness    Complete by:  As directed      Call MD for:  persistant nausea and vomiting    Complete by:  As directed      Call MD for:  severe uncontrolled pain    Complete by:  As directed      Call MD for:  temperature >100.4    Complete by:  As directed      Diet - low sodium heart healthy    Complete by:  As directed      Increase activity slowly    Complete by:  As directed             Consultations: None  Procedures Performed:  No results found.   Admission HPI: [name] is an 80 year old man with history of diverticulosis, HTN, HLD, CAD s/p multiple PCI, systolic and diastolic CHF, paroxysmal a fib, aortic stenosis s/p TAVR 08/10/2014, hypothyroidism, GERD presenting with diarrhea x 3 days. Reports watery stools. He had 5 episodes of diarrhea in last 24 hours. Some lightheadedness on standing prior to arrival but denies any since. Denies blood in stools. Tried a bland diet with no relief. He stopped his docusate on Saturday. Denies fevers, chills, SOB, CP, N/V, abdominal pain, dysuria, hematuria. Denies sick contacts, recent travel, well water. Denies foods that may have started this. Denies history of c diff.   Hospital Course by problem list: Active Problems:   Diarrhea   Diarrhea: Elderly patient with acute onset of past 3 days. No fever or peritoneal signs. Recently hospitalized s/p TAVR 08/10/2014. Denied blood in stool. He was admitted to the general medical floor. C diff PCR was negative. Likely diarrhea was 2/2 acute viral infection. PT and OT recommended home health PT and OT. Stool culture with no suspicious colonies. He was started on immodium 66m x 1 and 29mprn diarrhea. His diarrhea improved through the course of his hospitalization. He was able to maintain PO intake. PT recommended SNF but patient refused this and his caregiver was comfortable with him coming home with home health PT/OT services.  Discharge Vitals:   BP 151/63 mmHg  Pulse 67  Temp(Src) 97.7 F (36.5 C) (Oral)  Resp 18  Ht 5' 7"  (1.702 m)  Wt 197 lb 8.5 oz (89.6 kg)  BMI 30.93 kg/m2  SpO2 94%  Discharge Labs:  Recent Results (from the past 2160 hour(s))  Basic metabolic panel     Status: Abnormal   Collection Time: 07/16/14  9:53 AM  Result Value Ref Range   Sodium 140 137 - 147 mEq/L   Potassium 4.2 3.7 - 5.3 mEq/L   Chloride 101 96 - 112 mEq/L   CO2 27 19 - 32 mEq/L    Glucose, Bld 91 70 - 99 mg/dL   BUN 21  6 - 23 mg/dL   Creatinine, Ser 1.08 0.50 - 1.35 mg/dL   Calcium 9.3 8.4 - 10.5 mg/dL   GFR calc non Af Amer 59 (L) >90 mL/min   GFR calc Af Amer 69 (L) >90 mL/min    Comment: (NOTE) The eGFR has been calculated using the CKD EPI equation. This calculation has not been validated in all clinical situations. eGFR's persistently <90 mL/min signify possible Chronic Kidney Disease.   Anion gap 12 5 - 15  Protime-INR     Status: None   Collection Time: 07/16/14  9:53 AM  Result Value Ref Range   Prothrombin Time 13.2 11.6 - 15.2 seconds   INR 1.00 0.00 - 1.49  CBC     Status: Abnormal   Collection Time: 07/16/14  9:53 AM  Result Value Ref Range   WBC 6.9 4.0 - 10.5 K/uL   RBC 5.07 4.22 - 5.81 MIL/uL   Hemoglobin 16.1 13.0 - 17.0 g/dL   HCT 47.2 39.0 - 52.0 %   MCV 93.1 78.0 - 100.0 fL   MCH 31.8 26.0 - 34.0 pg   MCHC 34.1 30.0 - 36.0 g/dL   RDW 14.3 11.5 - 15.5 %   Platelets 148 (L) 150 - 400 K/uL  I-STAT 3, venous blood gas (G3P V)     Status: Abnormal   Collection Time: 07/16/14 12:31 PM  Result Value Ref Range   pH, Ven 7.395 (H) 7.250 - 7.300   pCO2, Ven 43.4 (L) 45.0 - 50.0 mmHg   pO2, Ven 31.0 30.0 - 45.0 mmHg   Bicarbonate 26.6 (H) 20.0 - 24.0 mEq/L   TCO2 28 0 - 100 mmol/L   O2 Saturation 58.0 %   Acid-Base Excess 1.0 0.0 - 2.0 mmol/L   Sample type VENOUS    Comment NOTIFIED PHYSICIAN   I-STAT 3, arterial blood gas (G3+)     Status: Abnormal   Collection Time: 07/16/14 12:31 PM  Result Value Ref Range   pH, Arterial 7.435 7.350 - 7.450   pCO2 arterial 38.3 35.0 - 45.0 mmHg   pO2, Arterial 62.0 (L) 80.0 - 100.0 mmHg   Bicarbonate 25.7 (H) 20.0 - 24.0 mEq/L   TCO2 27 0 - 100 mmol/L   O2 Saturation 92.0 %   Acid-Base Excess 1.0 0.0 - 2.0 mmol/L   Sample type ARTERIAL   Pulmonary function test     Status: None   Collection Time: 08/06/14 11:14 AM  Result Value Ref Range   FVC-Pre 2.84 L   FVC-%Pred-Pre 89 %   FVC-Post  2.54 L   FVC-%Pred-Post 80 %   FVC-%Change-Post -10 %   FEV1-Pre 2.22 L   FEV1-%Pred-Pre 103 %   FEV1-Post 2.09 L   FEV1-%Pred-Post 97 %   FEV1-%Change-Post -5 %   FEV6-Pre 2.81 L   FEV6-%Pred-Pre 97 %   FEV6-Post 2.54 L   FEV6-%Pred-Post 87 %   FEV6-%Change-Post -9 %   Pre FEV1/FVC ratio 78 %   FEV1FVC-%Pred-Pre 111 %   Post FEV1/FVC ratio 82 %   FEV1FVC-%Change-Post 5 %   Pre FEV6/FVC Ratio 99 %   FEV6FVC-%Pred-Pre 109 %   Post FEV6/FVC ratio 100 %   FEV6FVC-%Pred-Post 109 %   FEV6FVC-%Change-Post 0 %   FEF 25-75 Pre 2.15 L/sec   FEF2575-%Pred-Pre 165 %   FEF 25-75 Post 1.37 L/sec   FEF2575-%Pred-Post 105 %   FEF2575-%Change-Post -36 %   RV 2.45 L   RV % pred 92 %   TLC 5.15 L  TLC % pred 79 %   DLCO unc 14.64 ml/min/mmHg   DLCO unc % pred 51 %   DLCO cor 14.64 ml/min/mmHg   DLCO cor % pred 51 %   DL/VA 3.59 ml/min/mmHg/L   DL/VA % pred 82 %  Surgical pcr screen     Status: Abnormal   Collection Time: 08/06/14  1:10 PM  Result Value Ref Range   MRSA, PCR NEGATIVE NEGATIVE   Staphylococcus aureus POSITIVE (A) NEGATIVE    Comment:        The Xpert SA Assay (FDA approved for NASAL specimens in patients over 12 years of age), is one component of a comprehensive surveillance program.  Test performance has been validated by [employer] for patients greater than or equal to 22 year old. It is not intended to diagnose infection nor to guide or monitor treatment.  APTT     Status: None   Collection Time: 08/06/14  1:42 PM  Result Value Ref Range   aPTT 30 24 - 37 seconds  Blood gas, arterial on room air     Status: Abnormal   Collection Time: 08/06/14  1:42 PM  Result Value Ref Range   FIO2 0.21 %   pH, Arterial 7.431 7.350 - 7.450   pCO2 arterial 34.2 (L) 35.0 - 45.0 mmHg   pO2, Arterial 63.9 (L) 80.0 - 100.0 mmHg   Bicarbonate 22.4 20.0 - 24.0 mEq/L   TCO2 23.4 0 - 100 mmol/L   Acid-base deficit 1.3 0.0 - 2.0 mmol/L   O2 Saturation 90.9 %   Patient  temperature 98.6    Collection site LEFT BRACHIAL    Drawn by [id number]    Sample type ARTERIAL DRAW    Allens test (pass/fail) PASS PASS  CBC     Status: None   Collection Time: 08/06/14  1:42 PM  Result Value Ref Range   WBC 8.4 4.0 - 10.5 K/uL   RBC 5.09 4.22 - 5.81 MIL/uL   Hemoglobin 16.0 13.0 - 17.0 g/dL   HCT 47.4 39.0 - 52.0 %   MCV 93.1 78.0 - 100.0 fL   MCH 31.4 26.0 - 34.0 pg   MCHC 33.8 30.0 - 36.0 g/dL   RDW 14.3 11.5 - 15.5 %   Platelets 151 150 - 400 K/uL  Comprehensive metabolic panel     Status: Abnormal   Collection Time: 08/06/14  1:42 PM  Result Value Ref Range   Sodium 138 137 - 147 mEq/L   Potassium 4.2 3.7 - 5.3 mEq/L   Chloride 103 96 - 112 mEq/L   CO2 20 19 - 32 mEq/L   Glucose, Bld 106 (H) 70 - 99 mg/dL   BUN 22 6 - 23 mg/dL   Creatinine, Ser 0.94 0.50 - 1.35 mg/dL   Calcium 9.5 8.4 - 10.5 mg/dL   Total Protein 6.4 6.0 - 8.3 g/dL   Albumin 3.7 3.5 - 5.2 g/dL   AST 16 0 - 37 U/L   ALT 11 0 - 53 U/L   Alkaline Phosphatase 77 39 - 117 U/L   Total Bilirubin 0.5 0.3 - 1.2 mg/dL   GFR calc non Af Amer 72 (L) >90 mL/min   GFR calc Af Amer 84 (L) >90 mL/min    Comment: (NOTE) The eGFR has been calculated using the CKD EPI equation. This calculation has not been validated in all clinical situations. eGFR's persistently <90 mL/min signify possible Chronic Kidney Disease.   Anion gap 15 5 - 15  Hemoglobin  A1c     Status: None   Collection Time: 08/06/14  1:42 PM  Result Value Ref Range   Hgb A1c MFr Bld 5.2 <5.7 %    Comment: (NOTE)                                                                       According to the ADA Clinical Practice Recommendations for 2011, when HbA1c is used as a screening test:  >=6.5%   Diagnostic of Diabetes Mellitus           (if abnormal result is confirmed) 5.7-6.4%   Increased risk of developing Diabetes Mellitus References:Diagnosis and Classification of Diabetes Mellitus,Diabetes XHFS,1423,95(VUYEB 1):S62-S69 and  Standards of Medical Care in         Diabetes - 2011,Diabetes XIDH,6861,68 (Suppl 1):S11-S61.   Mean Plasma Glucose 103 <117 mg/dL    Comment: Performed at [facility]     Status: None   Collection Time: 08/06/14  1:42 PM  Result Value Ref Range   Prothrombin Time 13.8 11.6 - 15.2 seconds   INR 1.05 0.00 - 1.49  Urinalysis, Routine w reflex microscopic     Status: None   Collection Time: 08/06/14  1:42 PM  Result Value Ref Range   Color, Urine YELLOW YELLOW   APPearance CLEAR CLEAR   Specific Gravity, Urine 1.015 1.005 - 1.030   pH 5.5 5.0 - 8.0   Glucose, UA NEGATIVE NEGATIVE mg/dL   Hgb urine dipstick NEGATIVE NEGATIVE   Bilirubin Urine NEGATIVE NEGATIVE   Ketones, ur NEGATIVE NEGATIVE mg/dL   Protein, ur NEGATIVE NEGATIVE mg/dL   Urobilinogen, UA 0.2 0.0 - 1.0 mg/dL   Nitrite NEGATIVE NEGATIVE   Leukocytes, UA NEGATIVE NEGATIVE    Comment: MICROSCOPIC NOT DONE ON URINES WITH NEGATIVE PROTEIN, BLOOD, LEUKOCYTES, NITRITE, OR GLUCOSE <1000 mg/dL.  Type and screen     Status: None   Collection Time: 08/06/14  1:45 PM  Result Value Ref Range   ABO/RH(D) O POS    Antibody Screen NEG    Sample Expiration 08/20/2014    Unit Number [id number]    Blood Component Type RED CELLS,LR    Unit division 00    Status of Unit REL FROM [facility]    Transfusion Status OK TO TRANSFUSE    Crossmatch Result Compatible    Unit Number [id number]    Blood Component Type RED CELLS,LR    Unit division 00    Status of Unit REL FROM [facility]    Transfusion Status OK TO TRANSFUSE    Crossmatch Result Compatible   I-STAT, chem 8     Status: Abnormal   Collection Time: 08/10/14  8:05 AM  Result Value Ref Range   Sodium 141 137 - 147 mEq/L   Potassium 3.5 (L) 3.7 - 5.3 mEq/L   Chloride 106 96 - 112 mEq/L   BUN 19 6 - 23 mg/dL   Creatinine, Ser 1.00 0.50 - 1.35 mg/dL   Glucose, Bld 101 (H) 70 - 99 mg/dL   Calcium, Ion 1.30 1.13 - 1.30 mmol/L   TCO2 25 0 - 100 mmol/L    Hemoglobin 13.9 13.0 - 17.0 g/dL   HCT 41.0 39.0 - 52.0 %  I-STAT 3,  arterial blood gas (G3+)     Status: Abnormal   Collection Time: 08/10/14  8:08 AM  Result Value Ref Range   pH, Arterial 7.345 (L) 7.350 - 7.450   pCO2 arterial 48.2 (H) 35.0 - 45.0 mmHg   pO2, Arterial 152.0 (H) 80.0 - 100.0 mmHg   Bicarbonate 26.3 (H) 20.0 - 24.0 mEq/L   TCO2 28 0 - 100 mmol/L   O2 Saturation 99.0 %   Sample type ARTERIAL   Prepare RBC     Status: None   Collection Time: 08/10/14  8:40 AM  Result Value Ref Range   Order Confirmation ORDER PROCESSED BY BLOOD BANK   I-STAT, chem 8     Status: Abnormal   Collection Time: 08/10/14  9:12 AM  Result Value Ref Range   Sodium 136 (L) 137 - 147 mEq/L   Potassium 3.9 3.7 - 5.3 mEq/L   Chloride 105 96 - 112 mEq/L   BUN 19 6 - 23 mg/dL   Creatinine, Ser 1.00 0.50 - 1.35 mg/dL   Glucose, Bld 153 (H) 70 - 99 mg/dL   Calcium, Ion 1.28 1.13 - 1.30 mmol/L   TCO2 24 0 - 100 mmol/L   Hemoglobin 12.6 (L) 13.0 - 17.0 g/dL   HCT 37.0 (L) 39.0 - 52.0 %  I-STAT, chem 8     Status: Abnormal   Collection Time: 08/10/14 10:11 AM  Result Value Ref Range   Sodium 133 (L) 137 - 147 mEq/L   Potassium 3.7 3.7 - 5.3 mEq/L   Chloride 112 96 - 112 mEq/L   BUN 19 6 - 23 mg/dL   Creatinine, Ser 1.00 0.50 - 1.35 mg/dL   Glucose, Bld 158 (H) 70 - 99 mg/dL   Calcium, Ion 1.18 1.13 - 1.30 mmol/L   TCO2 25 0 - 100 mmol/L   Hemoglobin 12.2 (L) 13.0 - 17.0 g/dL   HCT 36.0 (L) 39.0 - 52.0 %  CBC     Status: Abnormal   Collection Time: 08/10/14 11:00 AM  Result Value Ref Range   WBC 7.7 4.0 - 10.5 K/uL   RBC 4.17 (L) 4.22 - 5.81 MIL/uL   Hemoglobin 12.9 (L) 13.0 - 17.0 g/dL   HCT 39.1 39.0 - 52.0 %   MCV 93.8 78.0 - 100.0 fL   MCH 30.9 26.0 - 34.0 pg   MCHC 33.0 30.0 - 36.0 g/dL   RDW 14.6 11.5 - 15.5 %   Platelets 95 (L) 150 - 400 K/uL    Comment: PLATELET COUNT CONFIRMED BY SMEAR  Protime-INR     Status: Abnormal   Collection Time: 08/10/14 11:00 AM  Result Value  Ref Range   Prothrombin Time 15.4 (H) 11.6 - 15.2 seconds   INR 1.21 0.00 - 1.49  APTT     Status: None   Collection Time: 08/10/14 11:00 AM  Result Value Ref Range   aPTT 32 24 - 37 seconds  I-STAT 4, (NA,K, GLUC, HGB,HCT)     Status: Abnormal   Collection Time: 08/10/14 11:05 AM  Result Value Ref Range   Sodium 140 137 - 147 mEq/L   Potassium 4.0 3.7 - 5.3 mEq/L   Glucose, Bld 140 (H) 70 - 99 mg/dL   HCT 37.0 (L) 39.0 - 52.0 %   Hemoglobin 12.6 (L) 13.0 - 17.0 g/dL  I-STAT 3, arterial blood gas (G3+)     Status: Abnormal   Collection Time: 08/10/14 11:10 AM  Result Value Ref Range   pH, Arterial 7.346 (  L) 7.350 - 7.450   pCO2 arterial 44.3 35.0 - 45.0 mmHg   pO2, Arterial 85.0 80.0 - 100.0 mmHg   Bicarbonate 24.3 (H) 20.0 - 24.0 mEq/L   TCO2 26 0 - 100 mmol/L   O2 Saturation 96.0 %   Acid-base deficit 2.0 0.0 - 2.0 mmol/L   Patient temperature 36.8 C    Sample type ARTERIAL   Glucose, capillary     Status: Abnormal   Collection Time: 08/10/14 12:20 PM  Result Value Ref Range   Glucose-Capillary 102 (H) 70 - 99 mg/dL  Glucose, capillary     Status: Abnormal   Collection Time: 08/10/14  1:24 PM  Result Value Ref Range   Glucose-Capillary 110 (H) 70 - 99 mg/dL  Glucose, capillary     Status: Abnormal   Collection Time: 08/10/14  2:26 PM  Result Value Ref Range   Glucose-Capillary 100 (H) 70 - 99 mg/dL  Glucose, capillary     Status: None   Collection Time: 08/10/14  3:21 PM  Result Value Ref Range   Glucose-Capillary 99 70 - 99 mg/dL  Glucose, capillary     Status: Abnormal   Collection Time: 08/10/14  8:06 PM  Result Value Ref Range   Glucose-Capillary 122 (H) 70 - 99 mg/dL  Glucose, capillary     Status: None   Collection Time: 08/11/14 12:36 AM  Result Value Ref Range   Glucose-Capillary 82 70 - 99 mg/dL  Glucose, capillary     Status: None   Collection Time: 08/11/14 12:39 AM  Result Value Ref Range   Glucose-Capillary 83 70 - 99 mg/dL   Comment 1 Venous  Sample   Glucose, capillary     Status: Abnormal   Collection Time: 08/11/14  4:00 AM  Result Value Ref Range   Glucose-Capillary 113 (H) 70 - 99 mg/dL   Comment 1 Capillary Sample    Comment 2 Documented in Chart    Comment 3 Notify RN   CBC     Status: Abnormal   Collection Time: 08/11/14  4:30 AM  Result Value Ref Range   WBC 6.4 4.0 - 10.5 K/uL   RBC 4.03 (L) 4.22 - 5.81 MIL/uL   Hemoglobin 12.6 (L) 13.0 - 17.0 g/dL   HCT 37.8 (L) 39.0 - 52.0 %   MCV 93.8 78.0 - 100.0 fL   MCH 31.3 26.0 - 34.0 pg   MCHC 33.3 30.0 - 36.0 g/dL   RDW 14.3 11.5 - 15.5 %   Platelets 97 (L) 150 - 400 K/uL    Comment: CONSISTENT WITH PREVIOUS RESULT  Basic metabolic panel     Status: Abnormal   Collection Time: 08/11/14  4:30 AM  Result Value Ref Range   Sodium 139 137 - 147 mEq/L   Potassium 3.8 3.7 - 5.3 mEq/L   Chloride 102 96 - 112 mEq/L   CO2 26 19 - 32 mEq/L   Glucose, Bld 99 70 - 99 mg/dL   BUN 17 6 - 23 mg/dL   Creatinine, Ser 0.99 0.50 - 1.35 mg/dL   Calcium 8.9 8.4 - 10.5 mg/dL   GFR calc non Af Amer 71 (L) >90 mL/min   GFR calc Af Amer 82 (L) >90 mL/min    Comment: (NOTE) The eGFR has been calculated using the CKD EPI equation. This calculation has not been validated in all clinical situations. eGFR's persistently <90 mL/min signify possible Chronic Kidney Disease.    Anion gap 11 5 - 15  Magnesium  Status: None   Collection Time: 08/11/14  4:30 AM  Result Value Ref Range   Magnesium 1.8 1.5 - 2.5 mg/dL  Glucose, capillary     Status: Abnormal   Collection Time: 08/11/14  7:43 AM  Result Value Ref Range   Glucose-Capillary 107 (H) 70 - 99 mg/dL   Comment 1 Capillary Sample    Comment 2 Notify RN   Glucose, capillary     Status: None   Collection Time: 08/11/14 11:39 AM  Result Value Ref Range   Glucose-Capillary 98 70 - 99 mg/dL  Glucose, capillary     Status: Abnormal   Collection Time: 08/11/14  3:21 PM  Result Value Ref Range   Glucose-Capillary 123 (H) 70 - 99  mg/dL   Comment 1 Notify RN   Glucose, capillary     Status: Abnormal   Collection Time: 08/11/14  7:10 PM  Result Value Ref Range   Glucose-Capillary 123 (H) 70 - 99 mg/dL   Comment 1 Capillary Sample    Comment 2 Documented in Chart    Comment 3 Notify RN   Glucose, capillary     Status: Abnormal   Collection Time: 08/11/14 11:34 PM  Result Value Ref Range   Glucose-Capillary 107 (H) 70 - 99 mg/dL   Comment 1 Capillary Sample    Comment 2 Documented in Chart    Comment 3 Notify RN   CBC     Status: Abnormal   Collection Time: 08/12/14  3:00 AM  Result Value Ref Range   WBC 6.0 4.0 - 10.5 K/uL   RBC 4.07 (L) 4.22 - 5.81 MIL/uL   Hemoglobin 12.8 (L) 13.0 - 17.0 g/dL   HCT 38.6 (L) 39.0 - 52.0 %   MCV 94.8 78.0 - 100.0 fL   MCH 31.4 26.0 - 34.0 pg   MCHC 33.2 30.0 - 36.0 g/dL   RDW 14.2 11.5 - 15.5 %   Platelets 105 (L) 150 - 400 K/uL    Comment: CONSISTENT WITH PREVIOUS RESULT  Basic metabolic panel     Status: Abnormal   Collection Time: 08/12/14  3:00 AM  Result Value Ref Range   Sodium 142 137 - 147 mEq/L   Potassium 4.1 3.7 - 5.3 mEq/L   Chloride 104 96 - 112 mEq/L   CO2 27 19 - 32 mEq/L   Glucose, Bld 105 (H) 70 - 99 mg/dL   BUN 23 6 - 23 mg/dL   Creatinine, Ser 1.02 0.50 - 1.35 mg/dL   Calcium 9.3 8.4 - 10.5 mg/dL   GFR calc non Af Amer 63 (L) >90 mL/min   GFR calc Af Amer 74 (L) >90 mL/min    Comment: (NOTE) The eGFR has been calculated using the CKD EPI equation. This calculation has not been validated in all clinical situations. eGFR's persistently <90 mL/min signify possible Chronic Kidney Disease.    Anion gap 11 5 - 15  Glucose, capillary     Status: Abnormal   Collection Time: 08/12/14  4:20 AM  Result Value Ref Range   Glucose-Capillary 106 (H) 70 - 99 mg/dL   Comment 1 Capillary Sample    Comment 2 Documented in Chart    Comment 3 Notify RN   Glucose, capillary     Status: Abnormal   Collection Time: 08/12/14  7:17 AM  Result Value Ref Range    Glucose-Capillary 112 (H) 70 - 99 mg/dL   Comment 1 Capillary Sample    Comment 2 Notify RN  Basic metabolic panel     Status: Abnormal   Collection Time: 09/07/14 10:59 AM  Result Value Ref Range   Sodium 141 135 - 145 mEq/L   Potassium 4.2 3.5 - 5.1 mEq/L   Chloride 108 96 - 112 mEq/L   CO2 22 19 - 32 mEq/L   Glucose, Bld 101 (H) 70 - 99 mg/dL   BUN 24 (H) 6 - 23 mg/dL   Creatinine, Ser 1.2 0.4 - 1.5 mg/dL   Calcium 9.4 8.4 - 10.5 mg/dL   GFR 60.12 >60.00 mL/min  CBC     Status: Abnormal   Collection Time: 09/07/14 10:59 AM  Result Value Ref Range   WBC 7.3 4.0 - 10.5 K/uL   RBC 4.58 4.22 - 5.81 Mil/uL   Platelets 135.0 (L) 150.0 - 400.0 K/uL   Hemoglobin 14.1 13.0 - 17.0 g/dL   HCT 43.6 39.0 - 52.0 %   MCV 95.2 78.0 - 100.0 fl   MCHC 32.4 30.0 - 36.0 g/dL   RDW 15.0 11.5 - 15.5 %  Protime-INR     Status: None   Collection Time: 09/21/14  1:00 PM  Result Value Ref Range   Prothrombin Time 13.7 11.6 - 15.2 seconds   INR 1.04 0.00 - 1.49  CBC with Differential     Status: None   Collection Time: 09/21/14  1:45 PM  Result Value Ref Range   WBC 7.1 4.0 - 10.5 K/uL   RBC 4.85 4.22 - 5.81 MIL/uL   Hemoglobin 15.1 13.0 - 17.0 g/dL   HCT 46.1 39.0 - 52.0 %   MCV 95.1 78.0 - 100.0 fL   MCH 31.1 26.0 - 34.0 pg   MCHC 32.8 30.0 - 36.0 g/dL   RDW 14.8 11.5 - 15.5 %   Platelets 180 150 - 400 K/uL   Neutrophils Relative % 72 43 - 77 %   Neutro Abs 5.1 1.7 - 7.7 K/uL   Lymphocytes Relative 15 12 - 46 %   Lymphs Abs 1.1 0.7 - 4.0 K/uL   Monocytes Relative 10 3 - 12 %   Monocytes Absolute 0.7 0.1 - 1.0 K/uL   Eosinophils Relative 2 0 - 5 %   Eosinophils Absolute 0.2 0.0 - 0.7 K/uL   Basophils Relative 1 0 - 1 %   Basophils Absolute 0.0 0.0 - 0.1 K/uL  Comprehensive metabolic panel     Status: Abnormal   Collection Time: 09/21/14  1:45 PM  Result Value Ref Range   Sodium 137 137 - 147 mEq/L   Potassium 3.9 3.7 - 5.3 mEq/L   Chloride 103 96 - 112 mEq/L   CO2 19 19 - 32  mEq/L   Glucose, Bld 105 (H) 70 - 99 mg/dL   BUN 24 (H) 6 - 23 mg/dL   Creatinine, Ser 1.14 0.50 - 1.35 mg/dL   Calcium 9.5 8.4 - 10.5 mg/dL   Total Protein 6.7 6.0 - 8.3 g/dL   Albumin 3.6 3.5 - 5.2 g/dL   AST 16 0 - 37 U/L    Comment: HEMOLYSIS AT THIS LEVEL MAY AFFECT RESULT   ALT 9 0 - 53 U/L   Alkaline Phosphatase 83 39 - 117 U/L   Total Bilirubin 0.5 0.3 - 1.2 mg/dL   GFR calc non Af Amer 55 (L) >90 mL/min   GFR calc Af Amer 64 (L) >90 mL/min    Comment: (NOTE) The eGFR has been calculated using the CKD EPI equation. This calculation has not been validated in  all clinical situations. eGFR's persistently <90 mL/min signify possible Chronic Kidney Disease.    Anion gap 15 5 - 15  Lipase, blood     Status: None   Collection Time: 09/21/14  1:45 PM  Result Value Ref Range   Lipase 13 11 - 59 U/L  Urinalysis, Routine w reflex microscopic     Status: Abnormal   Collection Time: 09/21/14  2:53 PM  Result Value Ref Range   Color, Urine AMBER (A) YELLOW    Comment: BIOCHEMICALS MAY BE AFFECTED BY COLOR   APPearance CLEAR CLEAR   Specific Gravity, Urine 1.028 1.005 - 1.030   pH 5.0 5.0 - 8.0   Glucose, UA NEGATIVE NEGATIVE mg/dL   Hgb urine dipstick NEGATIVE NEGATIVE   Bilirubin Urine SMALL (A) NEGATIVE   Ketones, ur 15 (A) NEGATIVE mg/dL   Protein, ur NEGATIVE NEGATIVE mg/dL   Urobilinogen, UA 0.2 0.0 - 1.0 mg/dL   Nitrite NEGATIVE NEGATIVE   Leukocytes, UA NEGATIVE NEGATIVE    Comment: MICROSCOPIC NOT DONE ON URINES WITH NEGATIVE PROTEIN, BLOOD, LEUKOCYTES, NITRITE, OR GLUCOSE <1000 mg/dL.  Stool culture     Status: None (Preliminary result)   Collection Time: 09/21/14  5:38 PM  Result Value Ref Range   Specimen Description STOOL    Special Requests Normal    Culture      NO SUSPICIOUS COLONIES, CONTINUING TO HOLD Performed at [facility]    Report Status PENDING   Clostridium Difficile by PCR     Status: None   Collection Time: 09/21/14  6:29 PM  Result  Value Ref Range   C difficile by pcr NEGATIVE NEGATIVE  CBC     Status: None   Collection Time: 09/22/14  2:40 AM  Result Value Ref Range   WBC 7.2 4.0 - 10.5 K/uL   RBC 4.39 4.22 - 5.81 MIL/uL   Hemoglobin 13.8 13.0 - 17.0 g/dL   HCT 41.8 39.0 - 52.0 %   MCV 95.2 78.0 - 100.0 fL   MCH 31.4 26.0 - 34.0 pg   MCHC 33.0 30.0 - 36.0 g/dL   RDW 14.7 11.5 - 15.5 %   Platelets 165 150 - 400 K/uL  Basic metabolic panel     Status: Abnormal   Collection Time: 09/22/14  2:40 AM  Result Value Ref Range   Sodium 136 (L) 137 - 147 mEq/L   Potassium 3.7 3.7 - 5.3 mEq/L   Chloride 103 96 - 112 mEq/L   CO2 20 19 - 32 mEq/L   Glucose, Bld 87 70 - 99 mg/dL   BUN 22 6 - 23 mg/dL   Creatinine, Ser 1.09 0.50 - 1.35 mg/dL   Calcium 9.2 8.4 - 10.5 mg/dL   GFR calc non Af Amer 59 (L) >90 mL/min   GFR calc Af Amer 68 (L) >90 mL/min    Comment: (NOTE) The eGFR has been calculated using the CKD EPI equation. This calculation has not been validated in all clinical situations. eGFR's persistently <90 mL/min signify possible Chronic Kidney Disease.    Anion gap 13 5 - 15    Signed: Jacques Earthly, MD 09/24/2014, 1:31 PM    Services Ordered on Discharge: Home health PT and OT Equipment Ordered on Discharge: None

## 2014-09-24 NOTE — Progress Notes (Signed)
Occupational Therapy Treatment Patient Details Name: Eugene [name] MRN: [id number] DOB: Jan 08, 1924 Today's Date: 09/24/2014    History of present illness Mr. Eugene [name] is an 80 year old man with history of diverticulosis, HTN, HLD, CAD s/p multiple PCI, systolic and diastolic CHF, paroxysmal a fib, aortic stenosis s/p TAVR 08/10/2014, hypothyroidism, GERD presenting with diarrhea x 3 days   OT comments  Pt continues to be a high fall risk with poor safety awareness, generalized weakness and inability to stand without support.  SNF is the optimal discharge environment as pt could benefit from short term rehab prior to return home. Pt states he housemate will be able to offer him adequate assistance.    Follow Up Recommendations  SNF;Supervision/Assistance - 24 hour (if pt refuses, recommend HHOT )    Equipment Recommendations  None recommended by OT    Recommendations for Other Services      Precautions / Restrictions Precautions Precautions: Fall       Mobility Bed Mobility Overal bed mobility: Needs Assistance Bed Mobility: Sit to Supine       Sit to supine: Min guard   General bed mobility comments: no physical assist needed, uses momentum  Transfers Overall transfer level: Needs assistance Equipment used: Rolling walker (2 wheeled) Transfers: Sit to/from Stand Sit to Stand: Min assist         General transfer comment: assist to power up from recliner and 3 in1, decreased control of descent with impaired safety and tendency to sit prematurely.    Balance                                   ADL Overall ADL's : Needs assistance/impaired     Grooming: Supervision/safety;Wash/dry hands;Wash/dry face;Sitting           Upper Body Dressing : Minimal assistance;Sitting       Toilet Transfer: Minimal assistance;Ambulation;RW;BSC   Toileting- Clothing Manipulation and Hygiene: Sit to/from stand;Maximal assistance Toileting - Clothing  Manipulation Details (indicate cue type and reason): unable to release walker to manipulate clothes or perform pericare     Functional mobility during ADLs: Minimal assistance;Rolling walker General ADL Comments: Pt with attempt to sit prematurely on bed, stated he felt weak.      Vision                     Perception     Praxis      Cognition   Behavior During Therapy: [facility] for tasks assessed/performed   Area of Impairment: Problem solving;Safety/judgement     Memory: Decreased short-term memory;Decreased recall of precautions    Safety/Judgement: Decreased awareness of safety;Decreased awareness of deficits     General Comments: Pt without awareness of how to call for help, desired to return to bed.    Extremity/Trunk Assessment               Exercises     Shoulder Instructions       General Comments      Pertinent Vitals/ Pain       Pain Assessment: No/denies pain  Home Living                                          Prior Functioning/Environment  Frequency Min 2X/week     Progress Toward Goals  OT Goals(current goals can now be found in the care plan section)  Progress towards OT goals: Not progressing toward goals - comment (continues to be very weak)  Acute Rehab OT Goals Patient Stated Goal: not stated  Plan Discharge plan remains appropriate    Co-evaluation                 End of Session     Activity Tolerance Patient limited by fatigue   Patient Left in bed;with call bell/phone within reach;with bed alarm set   Nurse Communication          Time: [id number] OT Time Calculation (min): 24 min  Charges: OT General Charges $OT Visit: 1 Procedure OT Treatments $Self Care/Home Management : 23-37 mins  Evern BioMayberry, Aloha Bartok Lynn 09/24/2014, 1:56 PM [phone]

## 2014-09-25 LAB — STOOL CULTURE: Special Requests: NORMAL

## 2014-09-27 ENCOUNTER — Encounter: Payer: Self-pay | Admitting: Internal Medicine

## 2014-09-27 ENCOUNTER — Ambulatory Visit (INDEPENDENT_AMBULATORY_CARE_PROVIDER_SITE_OTHER): Payer: Medicare Other | Admitting: Internal Medicine

## 2014-09-27 VITALS — BP 116/58 | HR 78 | Temp 98.2°F | Ht 67.0 in

## 2014-09-27 DIAGNOSIS — Z87891 Personal history of nicotine dependence: Secondary | ICD-10-CM

## 2014-09-27 DIAGNOSIS — R197 Diarrhea, unspecified: Secondary | ICD-10-CM

## 2014-09-27 NOTE — Progress Notes (Signed)
Patient ID: Eugene [name], male   DOB: 1923-12-16, 80 y.o.   MRN: [id number]  Subjective:   Patient ID: Eugene [name] male   DOB: 1923-12-16 80 y.o.   MRN: [id number]  HPI: Eugene [name] is a 80 y.o. M w/ PMH diverticulosis, HTN, HLD, CAD s/p multiple PCI, systolic and diastolic CHF, paroxysmal a fib, aortic stenosis s/p TAVR 08/10/2014, hypothyroidism, and GERD presents for a hospital f/u.  He was admitted 12/15 with diarrhea, C.diff and stool cultures were negative, and his diarrhea was attributed to viral infection. Today he states that the diarrhea has resolved. He is no longer taking the Imodium. He has been undergoing home health PT/OT/RN. He  And his care giver states that the weakness he was experiencing in the hospital is improving.   Past Medical History  Diagnosis Date  . Glaucoma     Right eye  . Diverticulosis     multiple hospital admissions for GI bleed, has an episode roughly every 6 months,, last  colonscopy in 2006 showed diverticulosis, was evaluated by Dr. Madilyn FiremanHayes in the hospital in 2011 for lower GI bleed andd it  was suggested that the patient was not actively bleeding at that time and given his comorbid conditions colonoscopy was deferred. He is also chronically constipated  . Macular degeneration of right eye   . Allergic rhinitis   . CAD (coronary artery disease)     a. h/o remote inferior MI, h/o PCI to RCA in 1991. b. syncope 2013: severe AS and high grade prox LAD and other diffuse disease. High risk for CABG - s/p BMS to prox LAD and balloon aortic valvuloplasty. c. cath 07/16/14 - diffuse CAD, managed medically.  . Hypertension   . Hyperlipidemia   . Chronic combined systolic and diastolic CHF (congestive heart failure)     a. Most recent echo 08/2014: EF 30-35%.  . Supraventricular tachycardia     2 syndromes-nonsustained atrial tachycardia//adenosine responsive diuretic positive reentry probably AV node reentry  . Aortic stenosis     a. s/p AV balloon  valvuloplasty by Dr. [name] 11/2011. b. s/p TAVR 08/2014.  . Ischemic cardiomyopathy   . H/O: GI bleed     recurrent  . Hypothyroidism   . GERD (gastroesophageal reflux disease)   . Falls frequently     3 times in the past week/notes 09/11/2013  . Myocardial infarction 1991; 10/28/2011  . Arthritis     "might have slight; have trouble w/my fingers" (09/11/2013)  . PAF (paroxysmal atrial fibrillation)   . Peripheral vascular disease     a. h/o ulcer on the left great toe s/p angioplasty of the left anterior tibial artery.  . OSA on CPAP     since 91  . Limited mobility   . 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 PAT (paroxysmal atrial tachycardia)    Current Outpatient Prescriptions  Medication Sig Dispense Refill  . acetaminophen (TYLENOL) 500 MG tablet Take 1-2 tablets (500-1,000 mg total) by mouth every 6 (six) hours as needed for mild pain or moderate pain.    Marland Kitchen. amiodarone (PACERONE) 200 MG tablet Take 200 mg by mouth daily.    Marland Kitchen. aspirin EC 81 MG tablet Take 81 mg by mouth daily.    . Calcium Carb-Cholecalciferol (CALCIUM 600+D) 600-800 MG-UNIT TABS Take 1 tablet by mouth daily.    . ferrous sulfate 325 (65 FE)  MG tablet Take 325 mg by mouth daily with breakfast.    . furosemide (LASIX) 20 MG tablet Take 1 tablet (20 mg total) by mouth every morning. 30 tablet 6  . levothyroxine (SYNTHROID, LEVOTHROID) 25 MCG tablet TAKE 1 TABLET BY MOUTH EVERY DAY 30 tablet 3  . losartan (COZAAR) 50 MG tablet TAKE 1 TABLET (50 MG TOTAL) BY MOUTH DAILY. 30 tablet 1  . Multiple Vitamin (MULTIVITAMIN) tablet Take 1 tablet by mouth at bedtime.    Marland Kitchen oxybutynin (DITROPAN) 5 MG tablet Take 5 mg by mouth 2 (two) times daily.    . pantoprazole (PROTONIX) 40 MG tablet Take 40 mg by mouth daily.    . pravastatin (PRAVACHOL) 40 MG tablet Take 1 tablet (40 mg total) by mouth daily. 90 tablet 4   . spironolactone (ALDACTONE) 25 MG tablet Take 12.5 mg by mouth every other day.     . terazosin (HYTRIN) 1 MG capsule TAKE ONE CAPSULE BY MOUTH AT BEDTIME 30 capsule 2  . vitamin B-12 (CYANOCOBALAMIN) 1000 MCG tablet Take 1,000 mcg by mouth daily.    Marland Kitchen docusate sodium 100 MG CAPS Take 100 mg by mouth 2 (two) times daily as needed for mild constipation. (Patient not taking: Reported on 09/27/2014)    . loperamide (IMODIUM) 2 MG capsule Take 1 capsule (2 mg total) by mouth as needed for diarrhea or loose stools. Do not take more than 6 capsules in 24 hours. (Patient not taking: Reported on 09/27/2014) 15 capsule 0  . mupirocin ointment (BACTROBAN) 2 % Place 1 application into the nose 2 (two) times daily. Through 08/14/14 (Patient not taking: Reported on 09/21/2014)    . traMADol (ULTRAM) 50 MG tablet Take 1-2 tablets (50-100 mg total) by mouth every 12 (twelve) hours as needed for moderate pain or severe pain. (Patient not taking: Reported on 09/27/2014)     No current facility-administered medications for this visit.   Family History  Problem Relation Age of Onset  . Cancer Mother     Ovarian Cancer  . Cancer Father     Lung  . Heart disease Sister     Heart disease before age 71   History   Social History  . Marital Status: Widowed    Spouse Name: N/A    Number of Children: N/A  . Years of Education: N/A   Social History Main Topics  . Smoking status: Former Smoker -- 3.00 packs/day for 21 years    Types: Cigarettes    Quit date: 10/08/1958  . Smokeless tobacco: Never Used  . Alcohol Use: No  . Drug Use: No  . Sexual Activity: No   Other Topics Concern  . None   Social History Narrative   Lives at the [facility]. His pastor is his HCPOA  [facility]) [phone]). His daugther has been removed because she use $500k from his estate without his approval.  He also has a close friend who takes him for his doctor's appointments and is with him during  the hospital admission. 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 Never smoked or drank alcohol in past 40 years. No illicit drug use. Has Medicare.   Review of Systems: Constitutional: Denies fever, chills, appetite change, or fatigue.  Respiratory: Denies SOB, DOE. +cough Cardiovascular: Denies chest pain or leg swelling.  Gastrointestinal: Denies nausea, vomiting, abdominal pain, diarrhea, constipation, or blood in his stool  Genitourinary: Denies dysuria, urgency, frequency  Musculoskeletal: Denies myalgias or arthralgias Neurological: +Weakness of BLE when ambulating.  Psychiatric/Behavioral: Denies mood changes or confusion  Objective:  Physical Exam: Filed Vitals:   09/27/14 1015  BP: 116/58  Pulse: 78  Temp: 98.2 F (36.8 C)  TempSrc: Oral  Height: 5\' 7"  (1.702 m)  SpO2: 97%   Constitutional: Vital signs reviewed.  Patient is a well-developed and well-nourished male in no acute distress and cooperative with exam. Alert and oriented x3.  Head: Normocephalic and atraumatic Eyes: EOMI.  Cardiovascular: RRR, pulses symmetric and intact bilaterally Pulmonary/Chest: Normal respiratory effort, CTAB, no wheezes, rales, or rhonchi Abdominal: Soft. Non-tender, non-distended, bowel sounds are normal, no guarding present.  Musculoskeletal: Moves all 4 extremities Neurological: A&O x3, Strength is 5/5 and symmetric in BU&LE. Did not have the patient ambulate.  Skin: Warm, dry and intact.  Psychiatric: Normal mood and affect. Speech and behavior is normal.   Assessment & Plan:   Please refer to Problem List based Assessment and Plan

## 2014-09-27 NOTE — Patient Instructions (Signed)
You look great today!  **If you develop fevers, chills, increased cough, or if you start to feel bad, please call the clinic.   **Merry Christmas and Happy New Years!    General Instructions:   Please bring your medicines with you each time you come to clinic.  Medicines may include prescription medications, over-the-counter medications, herbal remedies, eye drops, vitamins, or other pills.   Progress Toward Treatment Goals:  Treatment Goal 06/11/2014  Blood pressure at goal  Prevent falls at goal    Self Care Goals & Plans:  Self Care Goal 06/11/2014  Manage my medications take my medicines as prescribed; bring my medications to every visit; refill my medications on time  Monitor my health keep track of my blood pressure  Eat healthy foods eat foods that are low in salt; eat baked foods instead of fried foods; eat more vegetables  Be physically active -  Prevent falls have my vision checked; wear appropriate shoes    No flowsheet data found.   Care Management & Community Referrals:  Referral 06/11/2014  Referrals made for care management support none needed  Referrals made to community resources -

## 2014-09-27 NOTE — Assessment & Plan Note (Addendum)
Pt hospitalized with diarrhea, thought to be viral in nature. Diarrhea has stopped and the patient is no longer taking Imodium. He is eating and drinking well. Pt still has weakness that began while in bed in the hospital, but per his care giver, Mr. Maisie Fushomas' strength is improving daily. He continues to have PT/OT/RN at home.  - Continue PT/OT at home

## 2014-09-29 NOTE — Progress Notes (Signed)
Internal Medicine Clinic Attending  Case discussed with Dr. Glenn soon after the resident saw the patient.  We reviewed the resident's history and exam and pertinent patient test results.  I agree with the assessment, diagnosis, and plan of care documented in the resident's note. 

## 2014-09-29 NOTE — Addendum Note (Signed)
Addended by: Debe CoderMULLEN, EMILY B on: 09/29/2014 02:38 PM   Modules accepted: Level of Service

## 2014-10-05 ENCOUNTER — Telehealth: Payer: Self-pay | Admitting: Cardiovascular Disease

## 2014-10-05 NOTE — Telephone Encounter (Signed)
New Msg          [name] pt had a fall Saturday with no injuries. Just wanted to provide an FYI.

## 2014-10-07 ENCOUNTER — Other Ambulatory Visit: Payer: Self-pay | Admitting: Cardiology

## 2014-10-07 NOTE — Telephone Encounter (Signed)
E sent to pharm 

## 2014-10-12 NOTE — Telephone Encounter (Signed)
I spoke with Rodena GoldmannSreejesh [facility] and OT okay for pt.

## 2014-10-12 NOTE — Telephone Encounter (Signed)
New message      Want order or OT for pt..verbal order will be ok

## 2014-10-19 ENCOUNTER — Other Ambulatory Visit (HOSPITAL_COMMUNITY): Payer: Self-pay | Admitting: Cardiovascular Disease

## 2014-10-19 ENCOUNTER — Other Ambulatory Visit (HOSPITAL_COMMUNITY): Payer: Medicare Other

## 2014-10-19 ENCOUNTER — Ambulatory Visit: Payer: Medicare Other | Admitting: Cardiovascular Disease

## 2014-10-19 ENCOUNTER — Ambulatory Visit (HOSPITAL_COMMUNITY): Payer: Medicare Other | Attending: Internal Medicine | Admitting: Cardiology

## 2014-10-19 DIAGNOSIS — Z954 Presence of other heart-valve replacement: Secondary | ICD-10-CM | POA: Insufficient documentation

## 2014-10-19 DIAGNOSIS — Z952 Presence of prosthetic heart valve: Secondary | ICD-10-CM

## 2014-10-19 NOTE — Telephone Encounter (Signed)
Pt's appt for Echo and OV with Dr [name] rescheduled to meet TAVR guidelines.

## 2014-10-19 NOTE — Progress Notes (Signed)
Echo performed. 

## 2014-10-22 ENCOUNTER — Ambulatory Visit (INDEPENDENT_AMBULATORY_CARE_PROVIDER_SITE_OTHER): Payer: Medicare Other | Admitting: Cardiovascular Disease

## 2014-10-22 ENCOUNTER — Encounter: Payer: Self-pay | Admitting: Cardiovascular Disease

## 2014-10-22 VITALS — BP 122/82 | HR 74 | Ht 67.0 in | Wt 198.0 lb

## 2014-10-22 DIAGNOSIS — I480 Paroxysmal atrial fibrillation: Secondary | ICD-10-CM

## 2014-10-22 DIAGNOSIS — Z954 Presence of other heart-valve replacement: Secondary | ICD-10-CM

## 2014-10-22 DIAGNOSIS — Z952 Presence of prosthetic heart valve: Secondary | ICD-10-CM

## 2014-10-22 NOTE — Progress Notes (Signed)
Cardiology Office Note   Date:  10/22/2014   ID:  Eugene [name], DOB 1924/04/22, MRN [id number]  PCP:  Earl Lagos, MD  Cardiologist:  Tonny Bollman, MD    Chief Complaint  Patient presents with  . Follow-up    s/p TAVR     History of Present Illness: Eugene [name] is a 81 y.o. male who presents for follow-up evaluation of aortic valve disease. The patient has long-standing aortic stenosis. He also has coronary disease with history of inferior wall myocardial infarction in the 1990s treated with balloon angioplasty. In 2013 he underwent PCI of the proximal LAD with a bare-metal stent and balloon aortic valvuloplasty. He has had recurrent GI bleeding while on dual antiplatelet therapy in the past. He also has a history of paroxysmal atrial fibrillation and he's been maintained on amiodarone. After extensive evaluation by the multidisciplinary heart valve team, the patient underwent transfemoral TAVR 08/10/2014. He was treated with a 29 mm transcatheter heart valve. We did not start Plavix as per standard of care guidelines because of his history of gastrointestinal bleeding. He is treated with low-dose aspirin.  The patient presents today for follow-up evaluation. He is doing quite well. He's here with his caregiver. He was able to run several errands yesterday. The patient's exertional dyspnea is improved. He reports improved energy level. He's had no chest pain or pressure. He still has occasional lightheadedness but it is much better than in the past. His leg swelling has completely resolved.   Past Medical History  Diagnosis Date  . Glaucoma     Right eye  . Diverticulosis     multiple hospital admissions for GI bleed, has an episode roughly every 6 months,, last  colonscopy in 2006 showed diverticulosis, was evaluated by Dr. Madilyn Fireman in the hospital in 2011 for lower GI bleed andd it  was suggested that the patient was not actively bleeding at that time and given his comorbid  conditions colonoscopy was deferred. He is also chronically constipated  . Macular degeneration of right eye   . Allergic rhinitis   . CAD (coronary artery disease)     a. h/o remote inferior MI, h/o PCI to RCA in 1991. b. syncope 2013: severe AS and high grade prox LAD and other diffuse disease. High risk for CABG - s/p BMS to prox LAD and balloon aortic valvuloplasty. c. cath 07/16/14 - diffuse CAD, managed medically.  . Hypertension   . Hyperlipidemia   . Chronic combined systolic and diastolic CHF (congestive heart failure)     a. Most recent echo 08/2014: EF 30-35%.  . Supraventricular tachycardia     2 syndromes-nonsustained atrial tachycardia//adenosine responsive diuretic positive reentry probably AV node reentry  . Aortic stenosis     a. s/p AV balloon valvuloplasty by Dr. Excell Seltzer 11/2011. b. s/p TAVR 08/2014.  . Ischemic cardiomyopathy   . H/O: GI bleed     recurrent  . Hypothyroidism   . GERD (gastroesophageal reflux disease)   . Falls frequently     3 times in the past week/notes 09/11/2013  . Myocardial infarction 1991; 10/28/2011  . Arthritis     "might have slight; have trouble w/my fingers" (09/11/2013)  . PAF (paroxysmal atrial fibrillation)   . Peripheral vascular disease     a. h/o ulcer on the left great toe s/p angioplasty of the left anterior tibial artery.  . OSA on CPAP     since 91  . Limited mobility   . 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 PAT (paroxysmal atrial tachycardia)     Past Surgical History  Procedure Laterality Date  . Glaucoma surgery      Implantation of Baerveldt glaucoma device lant with scleral reinforcement using tutoplast tissue graft right eye. [Other]  . Coronary angioplasty with stent placement  Jan. 23, 2013  . Corneal transplant Right   . Angioplasty Left 03/30/2013    tibial  . Tonsillectomy  1946  . Cataract  extraction w/ intraocular lens  implant, bilateral Bilateral   . Cardiac valve replacement  11/30/2011    aortic valve.?  . Transcatheter aortic valve replacement, transfemoral N/A 08/10/2014    Procedure: TRANSCATHETER AORTIC VALVE REPLACEMENT, TRANSFEMORAL;  Surgeon: Tonny Bollman, MD;  Location: [facility] OR;  Service: Open Heart Surgery;  Laterality: N/A;  . Tee without cardioversion N/A 08/10/2014    Procedure: TRANSESOPHAGEAL ECHOCARDIOGRAM (TEE);  Surgeon: Tonny Bollman, MD;  Location: [facility] OR;  Service: Open Heart Surgery;  Laterality: N/A;  . Coronary angiogram  11/02/2011    Procedure: CORONARY ANGIOGRAM;  Surgeon: Rollene Rotunda, MD;  Location: [facility] CATH LAB;  Service: Cardiovascular;;  . Left heart catheterization with coronary angiogram N/A 11/12/2011    Procedure: LEFT HEART CATHETERIZATION WITH CORONARY ANGIOGRAM;  Surgeon: Tonny Bollman, MD;  Location: [facility] CATH LAB;  Service: Cardiovascular;  Laterality: N/A;  . Percutaneous coronary stent intervention (pci-s) N/A 11/14/2011    Procedure: PERCUTANEOUS CORONARY STENT INTERVENTION (PCI-S);  Surgeon: Tonny Bollman, MD;  Location[facility] CATH LAB;  Service: Cardiovascular;  Laterality: N/A;  . Abdominal aortagram N/A 03/30/2013    Procedure: ABDOMINAL Ronny Flurry;  Surgeon: Chuck Hint, MD;  Location: [facility] CATH LAB;  Service: Cardiovascular;  Laterality: N/A;  . Left and right heart catheterization with coronary angiogram N/A 07/16/2014    Procedure: LEFT AND RIGHT HEART CATHETERIZATION WITH CORONARY ANGIOGRAM;  Surgeon: Micheline Chapman, MD;  Location: [facility] CATH LAB;  Service: Cardiovascular;  Laterality: N/A;    Current Outpatient Prescriptions  Medication Sig Dispense Refill  . acetaminophen (TYLENOL) 500 MG tablet Take 1-2 tablets (500-1,000 mg total) by mouth every 6 (six) hours as needed for mild pain or moderate pain.    Marland Kitchen amiodarone (PACERONE) 200 MG tablet Take 200 mg by mouth daily.    Marland Kitchen aspirin EC 81 MG tablet Take 81 mg by mouth daily.     . Calcium Carb-Cholecalciferol (CALCIUM 600+D) 600-800 MG-UNIT TABS Take 1 tablet by mouth daily.    Marland Kitchen docusate sodium 100 MG CAPS Take 100 mg by mouth 2 (two) times daily as needed for mild constipation.    . ferrous sulfate 325 (65 FE) MG tablet Take 325 mg by mouth daily with breakfast.    . furosemide (LASIX) 20 MG tablet TAKE 1 TABLET (20 MG TOTAL) BY MOUTH EVERY MORNING. 30 tablet 6  . levothyroxine (SYNTHROID, LEVOTHROID) 25 MCG tablet TAKE 1 TABLET BY MOUTH EVERY DAY 30 tablet 3  . losartan (COZAAR) 50 MG tablet TAKE 1 TABLET (50 MG TOTAL) BY MOUTH DAILY. 30 tablet 1  . Multiple Vitamin (MULTIVITAMIN) tablet Take 1 tablet by mouth at bedtime.    Marland Kitchen oxybutynin (DITROPAN) 5 MG tablet Take 5 mg by mouth 2 (two) times daily.    . pantoprazole (PROTONIX) 40 MG tablet Take 40 mg by mouth daily.    . pravastatin (PRAVACHOL) 40 MG tablet Take 1 tablet (40 mg total) by  mouth daily. 90 tablet 4  . spironolactone (ALDACTONE) 25 MG tablet Take 12.5 mg by mouth every other day.     . terazosin (HYTRIN) 1 MG capsule TAKE ONE CAPSULE BY MOUTH AT BEDTIME 30 capsule 2  . vitamin B-12 (CYANOCOBALAMIN) 1000 MCG tablet Take 1,000 mcg by mouth daily.     No current facility-administered medications for this visit.    Allergies:   Penicillins and Corn-containing products   Social History:  The patient  reports that he quit smoking about 56 years ago. His smoking use included Cigarettes. He has a 63 pack-year smoking history. He has never used smokeless tobacco. He reports that he does not drink alcohol or use illicit drugs.   Family History:  The patient's  family history includes Cancer in his father and mother; Heart disease in his sister.    ROS:  Please see the history of present illness.  Otherwise, review of systems is positive for dizziness.  All other systems are reviewed and negative.    PHYSICAL EXAM: VS:  BP 122/82 mmHg  Pulse 74  Ht  (1.702 m)  Wt 198 lb (89.812 kg)  BMI 31.00  kg/m2  SpO2 97% , BMI Body mass index is 31 kg/(m^2). GEN: Well nourished, well developed, elderly male in no acute distress HEENT: normal Neck: no JVD, carotid bruits, or masses Cardiac: RRR; no murmurs, rubs, or gallops,no edema  Respiratory:  clear to auscultation bilaterally, normal work of breathing GI: soft, nontender, nondistended, + BS MS: no deformity or atrophy Skin: warm and dry, no rash Neuro:  Strength and sensation are intact Psych: euthymic mood, full affect  EKG:  EKG is not ordered today.  Recent Labs: 04/07/2014: Pro B Natriuretic peptide (BNP) 1533.0*; TSH 4.000 08/11/2014: Magnesium 1.8 09/21/2014: ALT 9 09/22/2014: BUN 22; Creatinine 1.09; Hemoglobin 13.8; Platelets 165; Potassium 3.7; Sodium 136*   Lipid Panel     Component Value Date/Time   CHOL 132 11/27/2012 1205   TRIG 186* 11/27/2012 1205   HDL 35* 11/27/2012 1205   CHOLHDL 3.8 11/27/2012 1205   VLDL 37 11/27/2012 1205   LDLCALC 60 11/27/2012 1205      Wt Readings from Last 3 Encounters:  10/22/14 198 lb (89.812 kg)  09/24/14 197 lb 8.5 oz (89.6 kg)  09/07/14 204 lb 12.8 oz (92.897 kg)     Other studies Reviewed: 2-D echocardiogram 10/19/2014: Study Conclusions  - Left ventricle: The cavity size was mildly dilated. Wall thickness was increased in a pattern of moderate LVH. Systolic function was severely reduced. The estimated ejection fraction was in the range of 20% to 25%. Diffuse hypokinesis. There is akinesis of the basal-midinferolateral myocardium. Doppler parameters are consistent with high ventricular filling pressure. - Aortic valve: A bioprosthesis was present. - Ascending aorta: The ascending aorta was mildly dilated. - Mitral valve: Calcified annulus. There was mild regurgitation. - Left atrium: The atrium was moderately dilated.  Impressions:  - Technically difficult; severe LV dysfunction; elevated LV filling pressure; s/p TAVR with normal mean gradient of 9  mmHg; no AI; moderate LAE; mild MR.   ASSESSMENT AND PLAN: 1.  Aortic valve disease status post TAVR. The patient is doing well. His echocardiogram was reviewed and shows normal function of his aortic valve prosthesis with no significant paravalvular insufficiency. Unfortunately his LVEF remains severely depressed. However, symptomatically he is doing much better now with New [place] Heart Association functional class II symptoms. He will continue on his current medical program without changes.  2. Chronic systolic heart failure. Suspect this is multifactorial in this patient with ischemic heart disease, history of atrial dysrhythmias, and long-standing aortic valve disease. Despite his reduced LVEF, his functional capacity is improved after TAVR. Medical therapy is somewhat limited by his advanced age and tendency for dizziness. He is tolerating spironolactone and losartan. He has a history of bradycardia arrhythmias and I think it is best to avoid beta blockers in his case. We will continue to treat him medically.  3. Atrial tachycardia. He maintained sinus rhythm on amiodarone. Will update his amiodarone monitoring labs before he returns in 4 months with a TSH and liver function tests.  4. Coronary artery disease, native vessel, without symptoms of angina. The patient continues on appropriate risk reduction therapy with pravastatin and aspirin. Beta blocker contraindicated because of bradycardia arrhythmias.  Current medicines are reviewed with the patient today.  The patient does not have concerns regarding medicines.  The following changes have been made:  no change  Labs/ tests ordered today include:  Orders Placed This Encounter  Procedures  . Basic metabolic panel  . TSH  . Hepatic function panel    Disposition:   FU with me in 4 months  Signed, Tonny BollmanMichael Dorea Duff, MD  10/22/2014 2:34 PM    [facility] [address], [place], [place]  [address] Phone: [phone])  [phone]; Fax: [phone]

## 2014-10-22 NOTE — Patient Instructions (Signed)
Your physician recommends that you schedule a follow-up appointment in: 4 months with Dr [name].  Your physician recommends that you return for lab work in: 4 months when you see Dr Kyra Leylandooper--BMET/Liver profile/TSH.

## 2014-10-28 ENCOUNTER — Other Ambulatory Visit: Payer: Self-pay | Admitting: Cardiology

## 2014-11-15 ENCOUNTER — Telehealth: Payer: Self-pay | Admitting: *Deleted

## 2014-11-15 ENCOUNTER — Encounter: Payer: Self-pay | Admitting: Internal Medicine

## 2014-11-15 ENCOUNTER — Ambulatory Visit (INDEPENDENT_AMBULATORY_CARE_PROVIDER_SITE_OTHER): Payer: Medicare Other | Admitting: Internal Medicine

## 2014-11-15 VITALS — BP 150/82 | HR 70 | Temp 97.0°F | Wt 208.4 lb

## 2014-11-15 DIAGNOSIS — M79652 Pain in left thigh: Secondary | ICD-10-CM

## 2014-11-15 MED ORDER — TRAMADOL HCL 50 MG PO TABS: 25.0000 mg | ORAL_TABLET | Freq: Four times a day (QID) | ORAL | Status: DC | PRN

## 2014-11-15 NOTE — Progress Notes (Signed)
   Subjective:    Patient ID: Eugene [name], male    DOB: [birth year], 81 y.o.   MRN: [id number]  HPI Eugene [name] is a 81 year old male with coronary artery disease, hypertension, peripheral vascular disease, chronic combined systolic and diastolic congestive heart failure who presents today for left thigh pain. His caregiver is present at the time of interview and also contributed. Please see assessment & plan for documentation of each problem.   Review of Systems  Constitutional: Negative for fever.  Respiratory: Negative for shortness of breath.   Cardiovascular: Negative for chest pain.  Gastrointestinal: Negative for nausea, vomiting, abdominal pain and diarrhea.  Musculoskeletal: Negative for back pain and gait problem.       Objective:   Physical Exam Constitutional: Elderly gentleman sitting in wheelchair. Pleasant on appearance. HENT:  Head: Normocephalic and atraumatic.  Eyes: Conjunctivae are normal. Pupils are equal, round, and reactive to light.  Cardiovascular: Click at S2 best appreciated at the right upper sternal border consistent with aortic valve replacement. Pulmonary/Chest: Effort normal. No respiratory distress. He has no wheezes. He has no rales.  Abdominal: Soft. Bowel sounds are normal. He exhibits no distension. There is no tenderness.  Neurological: He is alert and oriented to person, place, and time. Upper extra strength 4 out of 5, lower extremity strength 3 out of 5 though exam limited by him sitting in the chair. External rotation of the hip intact bilaterally. Excellent Back: No pain on palpation of the lumbar spine. Skin: Skin is warm and dry. He is not diaphoretic.  Psychiatric: His behavior is normal.          Assessment & Plan:

## 2014-11-15 NOTE — Assessment & Plan Note (Signed)
Overview Reports 3 day history of pain underneath his left thigh extending from buttocks to to the knee along the hamstring region of his leg.  -Describes the pain as "cramping".  -Pain is relieved with tramadol, of which he has taken 2-3 tablets per day that he had left over from his most recent cardiac rehabilitation -Also takes 1-2 tablets of Tylenol before he goes to sleep and before he wakes up -Per caregiver, tramadol leaves him groggy and unsteady on his feet as well as with dry mouth. -Denies any recent change in by mouth intake, medication, recent falls, back pain, fevers, leg swelling, changes in activity at home, bed or chair.  -Per caregiver, he is fairly active at home and is able to ambulate with the help of a walker and sleeps on a big recliner  Assessment -Physical exam findings are reassuring for no acute trauma or disc herniation.  -Suspect etiology of pain is likely musculoskeletal  Plan -Continue Tylenol though advised them to limit to no more than 2 g per day -Continue tramadol 25 mg as needed overnight for breakthrough pain -Given return precautions should pain persist or worsen

## 2014-11-15 NOTE — Patient Instructions (Addendum)
Please try to bring all your medicines next time. This will help us keep you safe from mistakes.  For Tylenol, please do not take more than 2,000 mg in one day.  If needed, please take Tramadol 25mg  at night. Be wary of sedation and falls.

## 2014-11-15 NOTE — Telephone Encounter (Signed)
Thank you Helen 

## 2014-11-15 NOTE — Telephone Encounter (Signed)
Pt's caregiver calls and states pt has pain just under buttocks on L side x 3 days, causing very bad pain, not able to sleep at times. appt given for today at 1545 dr patel

## 2014-11-16 NOTE — Progress Notes (Signed)
Internal Medicine Clinic Attending  I saw and evaluated the patient.  I personally confirmed the key portions of the history and exam documented by Dr. Allena KatzPatel and I reviewed pertinent patient test results.  The assessment, diagnosis, and plan were formulated together and I agree with the documentation in the resident's note, with the following additional comments.  According the patient's caregiver, tramadol 50 mg seemed to make patient a little unsteady when dosed 2-3 times a day.  Agree with plan to try a lower dose of tramadol at night only if needed for cramps/pain; we cautioned caregiver not to continue tramadol if the lower dose appears to have side effects.

## 2014-11-18 ENCOUNTER — Other Ambulatory Visit (HOSPITAL_COMMUNITY): Payer: Medicare Other

## 2014-11-18 ENCOUNTER — Ambulatory Visit (INDEPENDENT_AMBULATORY_CARE_PROVIDER_SITE_OTHER): Payer: Medicare Other | Admitting: Internal Medicine

## 2014-11-18 ENCOUNTER — Ambulatory Visit: Payer: Medicare Other | Admitting: Cardiovascular Disease

## 2014-11-18 ENCOUNTER — Encounter: Payer: Self-pay | Admitting: Internal Medicine

## 2014-11-18 VITALS — BP 152/85 | HR 75 | Temp 97.6°F | Ht 67.0 in | Wt 203.0 lb

## 2014-11-18 DIAGNOSIS — I10 Essential (primary) hypertension: Secondary | ICD-10-CM

## 2014-11-18 DIAGNOSIS — M79652 Pain in left thigh: Secondary | ICD-10-CM

## 2014-11-18 NOTE — Assessment & Plan Note (Signed)
BP Readings from Last 3 Encounters:  11/18/14 152/85  11/15/14 150/82  10/22/14 122/82    Lab Results  Component Value Date   NA 136* 09/22/2014   K 3.7 09/22/2014   CREATININE 1.09 09/22/2014    Assessment: Blood pressure control:  fair Progress toward BP goal:   near goal Comments: pt with mildly elevated BP but is compliant with meds  Plan: Medications:  continue current medications Educational resources provided:   Self management tools provided:   Other plans: Will repeat BP on f/u in 3 months and consider increasing losartan to 100 mg if persistently elevated

## 2014-11-18 NOTE — Progress Notes (Signed)
   Subjective:    Patient ID: Eugene [name], male    DOB: May 28, 1924, 81 y.o.   MRN: [id number]  HPI Pt seen and examined. He is here for routine follow up.  [facility] for which he was taking Tramadol at home. States that before he came in last week the pain was severe and the tramadol was making him drowsy. He now takes the tramadol only at night and the pain in his leg is much improved. He takes Tylenol as needed during the day  He has no other complaints and feels well.    Review of Systems  Constitutional: Negative.   HENT: Negative.   Eyes: Negative.   Cardiovascular: Negative.   Gastrointestinal: Negative.   Genitourinary: Negative.   Musculoskeletal: Positive for myalgias.  Skin: Negative.   Neurological: Negative.   Psychiatric/Behavioral: Negative.        Objective:   Physical Exam  Constitutional: He is oriented to person, place, and time. He appears well-developed and well-nourished.  HENT:  Head: Normocephalic and atraumatic.  Eyes: Conjunctivae are normal.  Neck: Normal range of motion.  Cardiovascular: Normal rate and regular rhythm.   Pulmonary/Chest: Effort normal and breath sounds normal. No respiratory distress. He has no wheezes. He has no rales.  Abdominal: Soft. Bowel sounds are normal. He exhibits no distension. There is no tenderness.  Musculoskeletal: Normal range of motion. He exhibits no edema.  Neurological: He is alert and oriented to person, place, and time.  Skin: Skin is warm and dry.  Psychiatric: He has a normal mood and affect. His behavior is normal.          Assessment & Plan:  Please see problem based charting for assessment and plan:

## 2014-11-18 NOTE — Assessment & Plan Note (Signed)
-   Pain has much improved now - Will c/w tramadol at night and tylenol prn during the day - Patient to f/u if worsening pain

## 2014-11-18 NOTE — Patient Instructions (Signed)
-   It was a pleasure meeting you today - Please call if your leg pain worsens or you start developing fevers - Please follow up in 3 months - You are doing a great job. Keep up the good work.

## 2014-11-25 ENCOUNTER — Inpatient Hospital Stay (HOSPITAL_COMMUNITY)
Admission: EM | Admit: 2014-11-25 | Discharge: 2014-12-02 | DRG: 308 | Disposition: A | Discharge status: Home Health | Payer: Medicare Other | Attending: Cardiovascular Disease | Admitting: Cardiovascular Disease

## 2014-11-25 ENCOUNTER — Encounter (HOSPITAL_COMMUNITY)
Admission: EM | Disposition: A | Discharge status: Home Health | Payer: PRIVATE HEALTH INSURANCE | Source: Home / Self Care | Attending: Cardiovascular Disease

## 2014-11-25 ENCOUNTER — Encounter (HOSPITAL_COMMUNITY): Payer: Self-pay | Admitting: Family Medicine

## 2014-11-25 ENCOUNTER — Emergency Department (HOSPITAL_COMMUNITY): Payer: Medicare Other

## 2014-11-25 DIAGNOSIS — I442 Atrioventricular block, complete: Secondary | ICD-10-CM | POA: Diagnosis present

## 2014-11-25 DIAGNOSIS — I471 Supraventricular tachycardia, unspecified: Secondary | ICD-10-CM | POA: Diagnosis present

## 2014-11-25 DIAGNOSIS — R5381 Other malaise: Secondary | ICD-10-CM | POA: Diagnosis not present

## 2014-11-25 DIAGNOSIS — Z953 Presence of xenogenic heart valve: Secondary | ICD-10-CM | POA: Diagnosis not present

## 2014-11-25 DIAGNOSIS — Z952 Presence of prosthetic heart valve: Secondary | ICD-10-CM

## 2014-11-25 DIAGNOSIS — R7881 Bacteremia: Secondary | ICD-10-CM | POA: Diagnosis present

## 2014-11-25 DIAGNOSIS — Z6841 Body Mass Index (BMI) 40.0 and over, adult: Secondary | ICD-10-CM

## 2014-11-25 DIAGNOSIS — I4892 Unspecified atrial flutter: Principal | ICD-10-CM | POA: Diagnosis present

## 2014-11-25 DIAGNOSIS — I739 Peripheral vascular disease, unspecified: Secondary | ICD-10-CM | POA: Diagnosis present

## 2014-11-25 DIAGNOSIS — Z954 Presence of other heart-valve replacement: Secondary | ICD-10-CM

## 2014-11-25 DIAGNOSIS — I129 Hypertensive chronic kidney disease with stage 1 through stage 4 chronic kidney disease, or unspecified chronic kidney disease: Secondary | ICD-10-CM | POA: Diagnosis present

## 2014-11-25 DIAGNOSIS — H3530 Unspecified macular degeneration: Secondary | ICD-10-CM | POA: Diagnosis present

## 2014-11-25 DIAGNOSIS — Z79899 Other long term (current) drug therapy: Secondary | ICD-10-CM | POA: Diagnosis not present

## 2014-11-25 DIAGNOSIS — H409 Unspecified glaucoma: Secondary | ICD-10-CM | POA: Diagnosis present

## 2014-11-25 DIAGNOSIS — Z7982 Long term (current) use of aspirin: Secondary | ICD-10-CM

## 2014-11-25 DIAGNOSIS — G4733 Obstructive sleep apnea (adult) (pediatric): Secondary | ICD-10-CM | POA: Diagnosis present

## 2014-11-25 DIAGNOSIS — N189 Chronic kidney disease, unspecified: Secondary | ICD-10-CM | POA: Diagnosis present

## 2014-11-25 DIAGNOSIS — R0603 Acute respiratory distress: Secondary | ICD-10-CM

## 2014-11-25 DIAGNOSIS — I5043 Acute on chronic combined systolic (congestive) and diastolic (congestive) heart failure: Secondary | ICD-10-CM | POA: Diagnosis present

## 2014-11-25 DIAGNOSIS — I480 Paroxysmal atrial fibrillation: Secondary | ICD-10-CM | POA: Diagnosis present

## 2014-11-25 DIAGNOSIS — Z87891 Personal history of nicotine dependence: Secondary | ICD-10-CM | POA: Diagnosis not present

## 2014-11-25 DIAGNOSIS — Z720 Tobacco use: Secondary | ICD-10-CM

## 2014-11-25 DIAGNOSIS — F419 Anxiety disorder, unspecified: Secondary | ICD-10-CM | POA: Diagnosis present

## 2014-11-25 DIAGNOSIS — I5042 Chronic combined systolic (congestive) and diastolic (congestive) heart failure: Secondary | ICD-10-CM | POA: Diagnosis present

## 2014-11-25 DIAGNOSIS — E785 Hyperlipidemia, unspecified: Secondary | ICD-10-CM | POA: Diagnosis present

## 2014-11-25 DIAGNOSIS — B9689 Other specified bacterial agents as the cause of diseases classified elsewhere: Secondary | ICD-10-CM | POA: Diagnosis present

## 2014-11-25 DIAGNOSIS — R296 Repeated falls: Secondary | ICD-10-CM | POA: Diagnosis present

## 2014-11-25 DIAGNOSIS — I252 Old myocardial infarction: Secondary | ICD-10-CM

## 2014-11-25 DIAGNOSIS — R9431 Abnormal electrocardiogram [ECG] [EKG]: Secondary | ICD-10-CM

## 2014-11-25 DIAGNOSIS — I200 Unstable angina: Secondary | ICD-10-CM | POA: Diagnosis not present

## 2014-11-25 DIAGNOSIS — I255 Ischemic cardiomyopathy: Secondary | ICD-10-CM | POA: Diagnosis present

## 2014-11-25 DIAGNOSIS — Z955 Presence of coronary angioplasty implant and graft: Secondary | ICD-10-CM

## 2014-11-25 DIAGNOSIS — Z8719 Personal history of other diseases of the digestive system: Secondary | ICD-10-CM

## 2014-11-25 DIAGNOSIS — I25110 Atherosclerotic heart disease of native coronary artery with unstable angina pectoris: Secondary | ICD-10-CM | POA: Diagnosis present

## 2014-11-25 DIAGNOSIS — Z9862 Peripheral vascular angioplasty status: Secondary | ICD-10-CM

## 2014-11-25 DIAGNOSIS — K219 Gastro-esophageal reflux disease without esophagitis: Secondary | ICD-10-CM | POA: Diagnosis present

## 2014-11-25 DIAGNOSIS — N390 Urinary tract infection, site not specified: Secondary | ICD-10-CM | POA: Diagnosis present

## 2014-11-25 DIAGNOSIS — I10 Essential (primary) hypertension: Secondary | ICD-10-CM | POA: Diagnosis present

## 2014-11-25 DIAGNOSIS — I484 Atypical atrial flutter: Secondary | ICD-10-CM

## 2014-11-25 DIAGNOSIS — E039 Hypothyroidism, unspecified: Secondary | ICD-10-CM | POA: Diagnosis present

## 2014-11-25 DIAGNOSIS — Z7901 Long term (current) use of anticoagulants: Secondary | ICD-10-CM

## 2014-11-25 DIAGNOSIS — E669 Obesity, unspecified: Secondary | ICD-10-CM | POA: Diagnosis present

## 2014-11-25 DIAGNOSIS — R0600 Dyspnea, unspecified: Secondary | ICD-10-CM

## 2014-11-25 DIAGNOSIS — Z947 Corneal transplant status: Secondary | ICD-10-CM | POA: Diagnosis not present

## 2014-11-25 DIAGNOSIS — I2510 Atherosclerotic heart disease of native coronary artery without angina pectoris: Secondary | ICD-10-CM | POA: Diagnosis present

## 2014-11-25 DIAGNOSIS — R339 Retention of urine, unspecified: Secondary | ICD-10-CM | POA: Diagnosis not present

## 2014-11-25 DIAGNOSIS — R112 Nausea with vomiting, unspecified: Secondary | ICD-10-CM

## 2014-11-25 LAB — COMPREHENSIVE METABOLIC PANEL
ALT: 112 U/L — AB (ref 0–53)
AST: 234 U/L — AB (ref 0–37)
Albumin: 3.9 g/dL (ref 3.5–5.2)
Alkaline Phosphatase: 178 U/L — ABNORMAL HIGH (ref 39–117)
Anion gap: 14 (ref 5–15)
BILIRUBIN TOTAL: 3.2 mg/dL — AB (ref 0.3–1.2)
BUN: 22 mg/dL (ref 6–23)
CO2: 22 mmol/L (ref 19–32)
Calcium: 9.6 mg/dL (ref 8.4–10.5)
Chloride: 103 mmol/L (ref 96–112)
Creatinine, Ser: 1.27 mg/dL (ref 0.50–1.35)
GFR calc Af Amer: 56 mL/min — ABNORMAL LOW (ref >90)
GFR calc non Af Amer: 49 mL/min — ABNORMAL LOW (ref >90)
Glucose, Bld: 164 mg/dL — ABNORMAL HIGH (ref 70–99)
Potassium: 3.8 mmol/L (ref 3.5–5.1)
SODIUM: 139 mmol/L (ref 135–145)
Total Protein: 6.7 g/dL (ref 6.0–8.3)

## 2014-11-25 LAB — PROTIME-INR
INR: 1.03 (ref 0.00–1.49)
PROTHROMBIN TIME: 13.7 s (ref 11.6–15.2)

## 2014-11-25 LAB — CBC
HCT: 48.4 % (ref 39.0–52.0)
HEMOGLOBIN: 16.3 g/dL (ref 13.0–17.0)
MCH: 32.1 pg (ref 26.0–34.0)
MCHC: 33.7 g/dL (ref 30.0–36.0)
MCV: 95.5 fL (ref 78.0–100.0)
Platelets: 135 10*3/uL — ABNORMAL LOW (ref 150–400)
RBC: 5.07 MIL/uL (ref 4.22–5.81)
RDW: 13.9 % (ref 11.5–15.5)
WBC: 10.1 10*3/uL (ref 4.0–10.5)

## 2014-11-25 LAB — POC OCCULT BLOOD, ED: Fecal Occult Bld: NEGATIVE

## 2014-11-25 LAB — MRSA PCR SCREENING: MRSA by PCR: NEGATIVE

## 2014-11-25 LAB — I-STAT TROPONIN, ED: Troponin i, poc: 0.01 ng/mL (ref 0.00–0.08)

## 2014-11-25 LAB — APTT: aPTT: 25 seconds (ref 24–37)

## 2014-11-25 SURGERY — LEFT HEART CATHETERIZATION WITH CORONARY ANGIOGRAM

## 2014-11-25 MED ORDER — ASPIRIN 81 MG PO CHEW
324.0000 mg | CHEWABLE_TABLET | Freq: Once | ORAL | Status: AC
Start: 2014-11-25 — End: 2014-11-25
  Administered 2014-11-25: 324 mg via ORAL
  Filled 2014-11-25: qty 4

## 2014-11-25 MED ORDER — NITROGLYCERIN 0.4 MG SL SUBL
0.4000 mg | SUBLINGUAL_TABLET | SUBLINGUAL | Status: DC | PRN
  Administered 2014-11-26: 0.4 mg via SUBLINGUAL
  Filled 2014-11-25: qty 1

## 2014-11-25 MED ORDER — AMIODARONE HCL IN DEXTROSE 360-4.14 MG/200ML-% IV SOLN
60.0000 mg/h | INTRAVENOUS | Status: AC
  Administered 2014-11-25: 60 mg/h via INTRAVENOUS

## 2014-11-25 MED ORDER — TRAMADOL HCL 50 MG PO TABS
25.0000 mg | ORAL_TABLET | Freq: Four times a day (QID) | ORAL | Status: DC | PRN
  Administered 2014-11-26 – 2014-11-28 (×3): 25 mg via ORAL
  Filled 2014-11-25 (×3): qty 1

## 2014-11-25 MED ORDER — ASPIRIN EC 81 MG PO TBEC
81.0000 mg | DELAYED_RELEASE_TABLET | Freq: Every day | ORAL | Status: DC
  Administered 2014-11-26 – 2014-11-30 (×5): 81 mg via ORAL
  Filled 2014-11-25 (×5): qty 1

## 2014-11-25 MED ORDER — ASPIRIN 81 MG PO CHEW: 324.0000 mg | CHEWABLE_TABLET | ORAL | Status: AC

## 2014-11-25 MED ORDER — LEVOTHYROXINE SODIUM 25 MCG PO TABS
25.0000 ug | ORAL_TABLET | Freq: Every day | ORAL | Status: DC
  Administered 2014-11-27 – 2014-11-29 (×3): 25 ug via ORAL
  Filled 2014-11-25 (×4): qty 1

## 2014-11-25 MED ORDER — ASPIRIN 300 MG RE SUPP
300.0000 mg | RECTAL | Status: AC
Start: 2014-11-25 — End: 2014-11-26

## 2014-11-25 MED ORDER — HEPARIN (PORCINE) IN NACL 100-0.45 UNIT/ML-% IJ SOLN
1200.0000 [IU]/h | INTRAMUSCULAR | Status: DC
  Administered 2014-11-25: 1300 [IU]/h via INTRAVENOUS
  Administered 2014-11-26: 1200 [IU]/h via INTRAVENOUS
  Filled 2014-11-25 (×3): qty 250

## 2014-11-25 MED ORDER — AMIODARONE HCL IN DEXTROSE 360-4.14 MG/200ML-% IV SOLN
60.0000 mg/h | Freq: Once | INTRAVENOUS | Status: AC
  Administered 2014-11-25: 60 mg/h via INTRAVENOUS
  Filled 2014-11-25: qty 200

## 2014-11-25 MED ORDER — PANTOPRAZOLE SODIUM 40 MG PO TBEC
40.0000 mg | DELAYED_RELEASE_TABLET | Freq: Every day | ORAL | Status: DC
  Administered 2014-11-25 – 2014-12-02 (×8): 40 mg via ORAL
  Filled 2014-11-25 (×8): qty 1

## 2014-11-25 MED ORDER — DEXTROSE 5 % IV SOLN: 60.0000 mg/h | Freq: Once | INTRAVENOUS | Status: DC

## 2014-11-25 MED ORDER — HEPARIN BOLUS VIA INFUSION
4000.0000 [IU] | Freq: Once | INTRAVENOUS | Status: AC
  Administered 2014-11-25: 4000 [IU] via INTRAVENOUS
  Filled 2014-11-25: qty 4000

## 2014-11-25 MED ORDER — DOCUSATE SODIUM 100 MG PO CAPS
100.0000 mg | ORAL_CAPSULE | Freq: Two times a day (BID) | ORAL | Status: DC | PRN
  Administered 2014-11-26 – 2014-11-28 (×2): 100 mg via ORAL
  Filled 2014-11-25 (×3): qty 1

## 2014-11-25 MED ORDER — AMIODARONE IV BOLUS ONLY 150 MG/100ML
150.0000 mg | Freq: Once | INTRAVENOUS | Status: DC
  Filled 2014-11-25: qty 100

## 2014-11-25 MED ORDER — ACETAMINOPHEN 325 MG PO TABS
650.0000 mg | ORAL_TABLET | ORAL | Status: DC | PRN
  Administered 2014-11-26 – 2014-11-28 (×3): 650 mg via ORAL
  Filled 2014-11-25 (×3): qty 2

## 2014-11-25 MED ORDER — SODIUM CHLORIDE 0.9 % IV SOLN: INTRAVENOUS | Status: DC

## 2014-11-25 MED ORDER — AMIODARONE HCL IN DEXTROSE 360-4.14 MG/200ML-% IV SOLN
INTRAVENOUS | Status: AC
  Filled 2014-11-25: qty 200

## 2014-11-25 MED ORDER — PRAVASTATIN SODIUM 40 MG PO TABS
40.0000 mg | ORAL_TABLET | Freq: Every day | ORAL | Status: DC
  Administered 2014-11-25 – 2014-12-02 (×8): 40 mg via ORAL
  Filled 2014-11-25 (×8): qty 1

## 2014-11-25 MED ORDER — SODIUM CHLORIDE 0.9 % IV BOLUS (SEPSIS)
500.0000 mL | Freq: Once | INTRAVENOUS | Status: AC
  Administered 2014-11-25: 500 mL via INTRAVENOUS

## 2014-11-25 MED ORDER — ACETAMINOPHEN 500 MG PO TABS
500.0000 mg | ORAL_TABLET | Freq: Four times a day (QID) | ORAL | Status: DC | PRN
  Administered 2014-11-28: 500 mg via ORAL
  Filled 2014-11-25: qty 1

## 2014-11-25 MED ORDER — ONDANSETRON HCL 4 MG/2ML IJ SOLN
4.0000 mg | Freq: Four times a day (QID) | INTRAMUSCULAR | Status: DC | PRN
  Administered 2014-11-26 – 2014-11-28 (×3): 4 mg via INTRAVENOUS
  Filled 2014-11-25 (×4): qty 2

## 2014-11-25 MED ORDER — LEVOTHYROXINE SODIUM 25 MCG PO TABS: 25.0000 ug | ORAL_TABLET | Freq: Every day | ORAL | Status: DC

## 2014-11-25 MED ORDER — AMIODARONE HCL IN DEXTROSE 360-4.14 MG/200ML-% IV SOLN
30.0000 mg/h | INTRAVENOUS | Status: DC
  Administered 2014-11-26 – 2014-11-27 (×3): 30 mg/h via INTRAVENOUS
  Filled 2014-11-25 (×7): qty 200

## 2014-11-25 MED ORDER — AMIODARONE LOAD VIA INFUSION
150.0000 mg | Freq: Once | INTRAVENOUS | Status: AC
  Administered 2014-11-25: 150 mg via INTRAVENOUS
  Filled 2014-11-25: qty 83.34

## 2014-11-25 MED ORDER — TERAZOSIN HCL 1 MG PO CAPS
1.0000 mg | ORAL_CAPSULE | Freq: Every day | ORAL | Status: DC
  Administered 2014-11-25 – 2014-11-26 (×2): 1 mg via ORAL
  Filled 2014-11-25 (×3): qty 1

## 2014-11-25 MED ORDER — AMIODARONE HCL IN DEXTROSE 360-4.14 MG/200ML-% IV SOLN: 30.0000 mg/h | INTRAVENOUS | Status: DC

## 2014-11-25 MED ORDER — ASPIRIN EC 81 MG PO TBEC: 81.0000 mg | DELAYED_RELEASE_TABLET | Freq: Every day | ORAL | Status: DC

## 2014-11-25 MED ORDER — FERROUS SULFATE 325 (65 FE) MG PO TABS
325.0000 mg | ORAL_TABLET | Freq: Every day | ORAL | Status: DC
  Administered 2014-11-26 – 2014-12-02 (×7): 325 mg via ORAL
  Filled 2014-11-25 (×9): qty 1

## 2014-11-25 MED ORDER — OXYBUTYNIN CHLORIDE 5 MG PO TABS
5.0000 mg | ORAL_TABLET | Freq: Two times a day (BID) | ORAL | Status: DC
  Administered 2014-11-25 – 2014-12-02 (×14): 5 mg via ORAL
  Filled 2014-11-25 (×15): qty 1

## 2014-11-25 NOTE — ED Notes (Addendum)
POC occult blood collected by Dr. Donnald GarrePfeiffer.

## 2014-11-25 NOTE — H&P (Signed)
Chief Complaint:  Unstable angina pectoris, CAD, status postTAVR, atrial flutter w 3:1 AVB  HPI:   He presented with abrupt onset of severe nausea and intractable vomiting for roughly 1 hour associated with shortness of breath. Anti-emetics administered in the ambulance lead to improvement but on arrival to the emergency room he was hypotensive. He improved after intravenous fluids. His electrocardiograms all show deep inferior wall Q waves consistent with old myocardial infarction but also varying degrees of ST elevation between 0.5 and 1.5 mm. There is a lot of artifact on the EMS of echocardiograms, but the emergency room seems to suggest that he has atrial tachycardia/atrial flutter with 3-1 AV conduction and a ventricular rate of approximately 115 bpm. A "code STEMI" was initially activated but after discussion with the interventional cardiologist on-call we decided that conventional noninvasive management is preferable. At this point in time he feels totally well. He is no longer nauseous, is minimally dyspneic and does not have chest pain. His blood pressure is now normal. Initial point of care cardiac troponin I is normal. This is a 81 y.o. male with a past medical history significant for extensive and complicated cardiac history.  - He underwent transarterial aortic valve replacement (29 mm Sapien) roughly 2 months ago for critical aortic stenosis.  - He has a remote history of inferior wall myocardial infarction underwent a PCI to the right coronary artery in 1991 and subsequent bare metal stent to the proximal LAD artery in 2013 (when he also underwent palliative aortic valve balloon valvuloplasty). Cardiac catheterization before his aortic stent valve procedure showed a severe diffusely diseased right coronary artery. Was felt to be a poor candidate for percutaneous revascularization with low yield because the inferior wall is mostly scar.  - Last November a left ventricular ejection  fraction was approximately 30-35 percent but a follow-up echo in January showed that his ejection fraction had dropped to 20-25 percent despite the fact that the aortic stent valve was functioning normally.  - Numerous cardiac rhythm problems. Had adenosine-responsive reentry atrial tachycardia felt to be probably AV node reentry and has had previous paroxysmal atrial fibrillation. On chronic amiodarone therapy. - She denies a history of GI bleeding but his chart documents episodes roughly every 6 months while on dual antiplatelet therapy felt to be secondary to diverticulosis. As far as I can tell however the last episode of GI bleeding was in 2011 - Significant extracardiac problems include hypertension, hyperlipidemia, obstructive sleep apnea, glaucoma and right eye macular degeneration, peripheral arterial disease requiring rescue angioplasty of the left anterior tibial artery due to a nonhealing great toe ulcer.  PMHx:  Past Medical History  Diagnosis Date  . Glaucoma     Right eye  . Diverticulosis     multiple hospital admissions for GI bleed, has an episode roughly every 6 months,, last  colonscopy in 2006 showed diverticulosis, was evaluated by Dr. Madilyn Fireman in the hospital in 2011 for lower GI bleed andd it  was suggested that the patient was not actively bleeding at that time and given his comorbid conditions colonoscopy was deferred. He is also chronically constipated  . Macular degeneration of right eye   . Allergic rhinitis   . CAD (coronary artery disease)     a. h/o remote inferior MI, h/o PCI to RCA in 1991. b. syncope 2013: severe AS and high grade prox LAD and other diffuse disease. High risk for CABG - s/p BMS to prox LAD and balloon aortic valvuloplasty. c. cath  07/16/14 - diffuse CAD, managed medically.  . Hypertension   . Hyperlipidemia   . Chronic combined systolic and diastolic CHF (congestive heart failure)     a. Most recent echo 08/2014: EF 30-35%.  . Supraventricular  tachycardia     2 syndromes-nonsustained atrial tachycardia//adenosine responsive diuretic positive reentry probably AV node reentry  . Aortic stenosis     a. s/p AV balloon valvuloplasty by Dr. Excell Seltzer 11/2011. b. s/p TAVR 08/2014.  . Ischemic cardiomyopathy   . H/O: GI bleed     recurrent  . Hypothyroidism   . GERD (gastroesophageal reflux disease)   . Falls frequently     3 times in the past week/notes 09/11/2013  . Myocardial infarction 1991; 10/28/2011  . Arthritis     "might have slight; have trouble w/my fingers" (09/11/2013)  . PAF (paroxysmal atrial fibrillation)   . Peripheral vascular disease     a. h/o ulcer on the left great toe s/p angioplasty of the left anterior tibial artery.  . OSA on CPAP     since 91  . Limited mobility   . 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 PAT (paroxysmal atrial tachycardia)     Past Surgical History  Procedure Laterality Date  . Glaucoma surgery      Implantation of Baerveldt glaucoma device lant with scleral reinforcement using tutoplast tissue graft right eye. [Other]  . Coronary angioplasty with stent placement  Jan. 23, 2013  . Corneal transplant Right   . Angioplasty Left 03/30/2013    tibial  . Tonsillectomy  1946  . Cataract extraction w/ intraocular lens  implant, bilateral Bilateral   . Cardiac valve replacement  11/30/2011    aortic valve.?  . Transcatheter aortic valve replacement, transfemoral N/A 08/10/2014    Procedure: TRANSCATHETER AORTIC VALVE REPLACEMENT, TRANSFEMORAL;  Surgeon: Tonny Bollman, MD;  Location[facility] [place];  Service: Open Heart Surgery;  Laterality: N/A;  . Tee without cardioversion N/A 08/10/2014    Procedure: TRANSESOPHAGEAL ECHOCARDIOGRAM (TEE);  Surgeon: Tonny Bollman, MD;  Location: [facility] OR;  Service: Open Heart Surgery;  Laterality: N/A;  . Coronary angiogram  11/02/2011    Procedure:  CORONARY ANGIOGRAM;  Surgeon: Rollene Rotunda, MD;  Location: [facility] CATH LAB;  Service: Cardiovascular;;  . Left heart catheterization with coronary angiogram N/A 11/12/2011    Procedure: LEFT HEART CATHETERIZATION WITH CORONARY ANGIOGRAM;  Surgeon: Tonny Bollman, MD;  Location: [facility] CATH LAB;  Service: Cardiovascular;  Laterality: N/A;  . Percutaneous coronary stent intervention (pci-s) N/A 11/14/2011    Procedure: PERCUTANEOUS CORONARY STENT INTERVENTION (PCI-S);  Surgeon: Tonny Bollman, MD;  Location: [facility] CATH LAB;  Service: Cardiovascular;  Laterality: N/A;  . Abdominal aortagram N/A 03/30/2013    Procedure: ABDOMINAL Ronny Flurry;  Surgeon: Chuck Hint, MD;  Location: [facility] CATH LAB;  Service: Cardiovascular;  Laterality: N/A;  . Left and right heart catheterization with coronary angiogram N/A 07/16/2014    Procedure: LEFT AND RIGHT HEART CATHETERIZATION WITH CORONARY ANGIOGRAM;  Surgeon: Micheline Chapman, MD;  Location: [facility] CATH LAB;  Service: Cardiovascular;  Laterality: N/A;    FAMHx:  Family History  Problem Relation Age of Onset  . Cancer Mother     Ovarian Cancer  . Cancer Father     Lung  . Heart disease Sister     Heart disease before age 12    SOCHx:   reports that he quit smoking  about 56 years ago. His smoking use included Cigarettes. He has a 63 pack-year smoking history. He has never used smokeless tobacco. He reports that he does not drink alcohol or use illicit drugs.  ALLERGIES:  Allergies  Allergen Reactions  . Penicillins Swelling and Other (See Comments)    REACTION: "turns red"  . Corn-Containing Products Other (See Comments)    Trouble digesting    ROS: Constitutional: negative for chills, fevers, sweats and weight loss Eyes: positive for visual disturbance and Is not changed from baseline Ears, nose, mouth, throat, and face: negative Respiratory: positive for Dyspnea at rest, negative for cough, hemoptysis and wheezing Cardiovascular: positive for chest  pressure/discomfort, negative for irregular heart beat, lower extremity edema, orthopnea, palpitations, paroxysmal nocturnal dyspnea and syncope Gastrointestinal: positive for nausea and vomiting, negative for abdominal pain, melena and Hematemesis Genitourinary:negative Integument/breast: negative Hematologic/lymphatic: negative for bleeding, easy bruising and petechiae Musculoskeletal:positive for arthralgias and back pain Neurological: negative for coordination problems, dizziness, gait problems, paresthesia, seizures, speech problems, tremors and weakness Behavioral/Psych: negative Endocrine: negative Allergic/Immunologic: negative  HOME MEDS: No current facility-administered medications on file prior to encounter.   Current Outpatient Prescriptions on File Prior to Encounter  Medication Sig Dispense Refill  . acetaminophen (TYLENOL) 500 MG tablet Take 1-2 tablets (500-1,000 mg total) by mouth every 6 (six) hours as needed for mild pain or moderate pain.    Marland Kitchen. amiodarone (PACERONE) 200 MG tablet Take 200 mg by mouth daily.    Marland Kitchen. aspirin EC 81 MG tablet Take 81 mg by mouth daily.    . Calcium Carb-Cholecalciferol (CALCIUM 600+D) 600-800 MG-UNIT TABS Take 1 tablet by mouth daily.    Marland Kitchen. docusate sodium 100 MG CAPS Take 100 mg by mouth 2 (two) times daily as needed for mild constipation.    . ferrous sulfate 325 (65 FE) MG tablet Take 325 mg by mouth daily with breakfast.    . furosemide (LASIX) 20 MG tablet TAKE 1 TABLET (20 MG TOTAL) BY MOUTH EVERY MORNING. 30 tablet 6  . levothyroxine (SYNTHROID, LEVOTHROID) 25 MCG tablet TAKE 1 TABLET BY MOUTH EVERY DAY 30 tablet 3  . losartan (COZAAR) 50 MG tablet TAKE 1 TABLET (50 MG TOTAL) BY MOUTH DAILY. 30 tablet 1  . Multiple Vitamin (MULTIVITAMIN) tablet Take 1 tablet by mouth at bedtime.    Marland Kitchen. oxybutynin (DITROPAN) 5 MG tablet Take 5 mg by mouth 2 (two) times daily.    . pantoprazole (PROTONIX) 40 MG tablet Take 40 mg by mouth daily.    .  pravastatin (PRAVACHOL) 40 MG tablet Take 1 tablet (40 mg total) by mouth daily. 90 tablet 4  . pravastatin (PRAVACHOL) 40 MG tablet TAKE 1 TABLET (40 MG TOTAL) BY MOUTH DAILY. 30 tablet 5  . spironolactone (ALDACTONE) 25 MG tablet Take 12.5 mg by mouth every other day.     . terazosin (HYTRIN) 1 MG capsule TAKE ONE CAPSULE BY MOUTH AT BEDTIME 30 capsule 2  . traMADol (ULTRAM) 50 MG tablet Take 0.5 tablets (25 mg total) by mouth every 6 (six) hours as needed. 20 tablet 0  . vitamin B-12 (CYANOCOBALAMIN) 1000 MCG tablet Take 1,000 mcg by mouth daily.       LABS/IMAGING: Results for orders placed or performed during the hospital encounter of 11/25/14 (from the past 48 hour(s))  CBC     Status: Abnormal   Collection Time: 11/25/14  6:16 PM  Result Value Ref Range   WBC 10.1 4.0 - 10.5 K/uL   RBC  5.07 4.22 - 5.81 MIL/uL   Hemoglobin 16.3 13.0 - 17.0 g/dL   HCT 16.1 09.6 - 04.5 %   MCV 95.5 78.0 - 100.0 fL   MCH 32.1 26.0 - 34.0 pg   MCHC 33.7 30.0 - 36.0 g/dL   RDW 40.9 81.1 - 91.4 %   Platelets 135 (L) 150 - 400 K/uL  APTT     Status: None   Collection Time: 11/25/14  6:16 PM  Result Value Ref Range   aPTT 25 24 - 37 seconds  Protime-INR     Status: None   Collection Time: 11/25/14  6:16 PM  Result Value Ref Range   Prothrombin Time 13.7 11.6 - 15.2 seconds   INR 1.03 0.00 - 1.49  I-stat troponin, ED     Status: None   Collection Time: 11/25/14  6:17 PM  Result Value Ref Range   Troponin i, poc 0.01 0.00 - 0.08 ng/mL   Comment 3            Comment: Due to the release kinetics of cTnI, a negative result within the first hours of the onset of symptoms does not rule out myocardial infarction with certainty. If myocardial infarction is still suspected, repeat the test at appropriate intervals.   POC occult blood, ED Provider will collect     Status: None   Collection Time: 11/25/14  6:31 PM  Result Value Ref Range   Fecal Occult Bld NEGATIVE NEGATIVE   No results  found.  VITALS: Blood pressure 124/64, pulse 108, temperature 98.1 F (36.7 C), resp. rate 19, height  (1.702 m), weight 130.636 kg (288 lb), SpO2 96 %.  EXAM:  General: Alert, oriented x3, no distress Head: no evidence of trauma, PERRL, EOMI, no exophtalmos or lid lag, no myxedema, no xanthelasma; normal ears, nose and oropharynx Neck: normal jugular venous pulsations and no hepatojugular reflux; brisk carotid pulses without delay and no carotid bruits Chest: clear to auscultation, no signs of consolidation by percussion or palpation, normal fremitus, symmetrical and full respiratory excursions Cardiovascular: normal position and quality of the apical impulse, regular rhythm, normal first heart sound and normal second heart sounds, no rubs or gallops, no murmur Abdomen: no tenderness or distention, no masses by palpation, no abnormal pulsatility or arterial bruits, normal bowel sounds, no hepatosplenomegaly Extremities: no clubbing, cyanosis or edema; 2+ radial, ulnar and brachial pulses bilaterally; 2+ right femoral, posterior tibial and dorsalis pedis pulses; 2+ left femoral, posterior tibial and dorsalis pedis pulses; no subclavian or femoral bruits Neurological: grossly nonfocal   IMPRESSION: Unstable angina pectoris and intractable nausea and vomiting in the setting of atrial flutter with 3:1 AV block and severe right coronary artery stenosis with likely residual ischemia in what is mostly a scarred inferior wall. ST segment elevation in the territory of old infarction may be tachycardia related. Review of his angiogram shows an extremely severely and diffusely diseased right coronary artery to be a very poor candidate for percutaneous revascularization. No recent episodes of gastrointestinal bleeding: We will start intravenous heparin as well as intravenous amiodarone. Might be a candidate for Ranexa long-term if angina is a prominent complaint. He has not chronically been on beta  blockers because of bradyarrhythmia, he does take daily amiodarone. Elective cardioversion is an option if he fails to improve, but would have to be preceded by transesophageal echocardiogram since he is not chronically anticoagulated. At least one month of full anticoagulation would be preferable if we do proceed with cardioversion. Recent  worsening of left ventricular systolic function is unexplained but currently he does not have manifestations of acute heart failure. Will discuss with Dr. Excell Seltzer whether there are further interventional therapeutic options   Thurmon Fair, MD, [facility] [phone] office [phone] pager  11/25/2014, 6:43 PM

## 2014-11-25 NOTE — ED Provider Notes (Signed)
CSN: [id number]     Arrival date & time 11/25/14  1743 History   First MD Initiated Contact with Patient 11/25/14 1755     Chief Complaint  Patient presents with  . Emesis     (Consider location/radiation/quality/duration/timing/severity/associated sxs/prior Treatment) HPI The patient reports a rapid onset of nausea with vomiting and shortness of breath. He denies he had chest pain in association with this. He did however feel very weak. He has no associated diarrhea. The patient has a prior history of heart attack and reports this does not feel similar to prior heart attack. The patient was given Zofran prior to my evaluation and reports some improvement after Zofran. By medic report also there was wheezing and thus he was given a DuoNeb. Patient reports he still feels short of breath and very weak. Past Medical History  Diagnosis Date  . Glaucoma     Right eye  . Diverticulosis     multiple hospital admissions for GI bleed, has an episode roughly every 6 months,, last  colonscopy in 2006 showed diverticulosis, was evaluated by Dr. Madilyn FiremanHayes in the hospital in 2011 for lower GI bleed andd it  was suggested that the patient was not actively bleeding at that time and given his comorbid conditions colonoscopy was deferred. He is also chronically constipated  . Macular degeneration of right eye   . Allergic rhinitis   . CAD (coronary artery disease)     a. h/o remote inferior MI, h/o PCI to RCA in 1991. b. syncope 2013: severe AS and high grade prox LAD and other diffuse disease. High risk for CABG - s/p BMS to prox LAD and balloon aortic valvuloplasty. c. cath 07/16/14 - diffuse CAD, managed medically.  . Hypertension   . Hyperlipidemia   . Chronic combined systolic and diastolic CHF (congestive heart failure)     a. Most recent echo 08/2014: EF 30-35%.  . Supraventricular tachycardia     2 syndromes-nonsustained atrial tachycardia//adenosine responsive diuretic positive reentry probably AV node  reentry  . Aortic stenosis     a. s/p AV balloon valvuloplasty by Dr. [name] 11/2011. b. s/p TAVR 08/2014.  . Ischemic cardiomyopathy   . H/O: GI bleed     recurrent  . Hypothyroidism   . GERD (gastroesophageal reflux disease)   . Falls frequently     3 times in the past week/notes 09/11/2013  . Myocardial infarction 1991; 10/28/2011  . Arthritis     "might have slight; have trouble w/my fingers" (09/11/2013)  . PAF (paroxysmal atrial fibrillation)   . Peripheral vascular disease     a. h/o ulcer on the left great toe s/p angioplasty of the left anterior tibial artery.  . OSA on CPAP     since 91  . Limited mobility   . 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 PAT (paroxysmal atrial tachycardia)    Past Surgical History  Procedure Laterality Date  . Glaucoma surgery      Implantation of Baerveldt glaucoma device lant with scleral reinforcement using tutoplast tissue graft right eye. [Other]  . Coronary angioplasty with stent placement  Jan. 23, 2013  . Corneal transplant Right   . Angioplasty Left 03/30/2013    tibial  . Tonsillectomy  1946  . Cataract extraction w/ intraocular lens  implant, bilateral Bilateral   . Cardiac valve replacement  11/30/2011    aortic valve.?  .  Transcatheter aortic valve replacement, transfemoral N/A 08/10/2014    Procedure: TRANSCATHETER AORTIC VALVE REPLACEMENT, TRANSFEMORAL;  Surgeon: Tonny Bollman, MD;  Location: [facility] OR;  Service: Open Heart Surgery;  Laterality: N/A;  . Tee without cardioversion N/A 08/10/2014    Procedure: TRANSESOPHAGEAL ECHOCARDIOGRAM (TEE);  Surgeon: Tonny Bollman, MD;  Location[facility] OR;  Service: Open Heart Surgery;  Laterality: N/A;  . Coronary angiogram  11/02/2011    Procedure: CORONARY ANGIOGRAM;  Surgeon: Rollene Rotunda, MD;  Location: [facility] CATH LAB;  Service: Cardiovascular;;  . Left heart catheterization  with coronary angiogram N/A 11/12/2011    Procedure: LEFT HEART CATHETERIZATION WITH CORONARY ANGIOGRAM;  Surgeon: Tonny Bollman, MD;  Location: [facility] CATH LAB;  Service: Cardiovascular;  Laterality: N/A;  . Percutaneous coronary stent intervention (pci-s) N/A 11/14/2011    Procedure: PERCUTANEOUS CORONARY STENT INTERVENTION (PCI-S);  Surgeon: Tonny Bollman, MD;  Location: [facility] CATH LAB;  Service: Cardiovascular;  Laterality: N/A;  . Abdominal aortagram N/A 03/30/2013    Procedure: ABDOMINAL Ronny Flurry;  Surgeon: Chuck Hint, MD;  Location: [facility] CATH LAB;  Service: Cardiovascular;  Laterality: N/A;  . Left and right heart catheterization with coronary angiogram N/A 07/16/2014    Procedure: LEFT AND RIGHT HEART CATHETERIZATION WITH CORONARY ANGIOGRAM;  Surgeon: Micheline Chapman, MD;  Location: [facility] CATH LAB;  Service: Cardiovascular;  Laterality: N/A;   Family History  Problem Relation Age of Onset  . Cancer Mother     Ovarian Cancer  . Cancer Father     Lung  . Heart disease Sister     Heart disease before age 69   History  Substance Use Topics  . Smoking status: Former Smoker -- 3.00 packs/day for 21 years    Types: Cigarettes    Quit date: 10/08/1958  . Smokeless tobacco: Never Used  . Alcohol Use: No    Review of Systems 10 Systems reviewed and are negative for acute change except as noted in the HPI.    Allergies  Penicillins and Corn-containing products  Home Medications   Prior to Admission medications   Medication Sig Start Date End Date Taking? Authorizing Provider  acetaminophen (TYLENOL) 500 MG tablet Take 1-2 tablets (500-1,000 mg total) by mouth every 6 (six) hours as needed for mild pain or moderate pain. 08/13/14   Dayna N Dunn, PA-C  amiodarone (PACERONE) 200 MG tablet Take 200 mg by mouth daily.    Historical Provider, MD  aspirin EC 81 MG tablet Take 81 mg by mouth daily.    Historical Provider, MD  Calcium Carb-Cholecalciferol (CALCIUM 600+D) 600-800 MG-UNIT  TABS Take 1 tablet by mouth daily.    Historical Provider, MD  docusate sodium 100 MG CAPS Take 100 mg by mouth 2 (two) times daily as needed for mild constipation. 08/13/14   Dayna N Dunn, PA-C  ferrous sulfate 325 (65 FE) MG tablet Take 325 mg by mouth daily with breakfast.    Historical Provider, MD  furosemide (LASIX) 20 MG tablet TAKE 1 TABLET (20 MG TOTAL) BY MOUTH EVERY MORNING. 10/07/14   Rollene Rotunda, MD  levothyroxine (SYNTHROID, LEVOTHROID) 25 MCG tablet TAKE 1 TABLET BY MOUTH EVERY DAY 08/16/14   Nischal Narendra, MD  losartan (COZAAR) 50 MG tablet TAKE 1 TABLET (50 MG TOTAL) BY MOUTH DAILY. 08/30/14   Earl Lagos, MD  Multiple Vitamin (MULTIVITAMIN) tablet Take 1 tablet by mouth at bedtime.    Historical Provider, MD  oxybutynin (DITROPAN) 5 MG tablet Take 5 mg by mouth 2 (two) times daily.  Historical Provider, MD  pantoprazole (PROTONIX) 40 MG tablet Take 40 mg by mouth daily.    Historical Provider, MD  pravastatin (PRAVACHOL) 40 MG tablet Take 1 tablet (40 mg total) by mouth daily. 06/17/14   Nischal Narendra, MD  pravastatin (PRAVACHOL) 40 MG tablet TAKE 1 TABLET (40 MG TOTAL) BY MOUTH DAILY. 10/29/14   Rollene Rotunda, MD  spironolactone (ALDACTONE) 25 MG tablet Take 12.5 mg by mouth every other day.     Historical Provider, MD  terazosin (HYTRIN) 1 MG capsule TAKE ONE CAPSULE BY MOUTH AT BEDTIME 09/20/14   Nischal Narendra, MD  traMADol (ULTRAM) 50 MG tablet Take 0.5 tablets (25 mg total) by mouth every 6 (six) hours as needed. 11/15/14 11/15/15  Heywood Iles, MD  vitamin B-12 (CYANOCOBALAMIN) 1000 MCG tablet Take 1,000 mcg by mouth daily.    Historical Provider, MD   BP 108/66 mmHg  Pulse 105  Temp(Src) 98.1 F (36.7 C)  Resp 36  Ht 5\' 7"  (1.702 m)  Wt 288 lb (130.636 kg)  BMI 45.10 kg/m2  SpO2 93% Physical Exam  Constitutional: He is oriented to person, place, and time.  The patient is a moderately obese elderly male. He is alert and has mild respiratory distress. He  is uncomfortable in appearance.  HENT:  Head: Normocephalic and atraumatic.  Mouth/Throat: Oropharynx is clear and moist.  Eyes: EOM are normal. Pupils are equal, round, and reactive to light.  Neck: Neck supple.  Cardiovascular: Regular rhythm, normal heart sounds and intact distal pulses.   Heart is tachycardic and cardiac sounds are distant.  Pulmonary/Chest:  The patient has mild increased work of breathing. He is not taking full inspiration and has soft breath sounds. I cannot appreciate gross wheeze however there do appear to be some fine rales at the bases.  Abdominal: Soft. Bowel sounds are normal. He exhibits no distension. There is no tenderness.  Genitourinary:  Rectal examination. Formed brown stool in the vault. Stool was not black or melanotic.  Musculoskeletal: Normal range of motion. He exhibits edema. He exhibits no tenderness.  Neurological: He is alert and oriented to person, place, and time. He has normal strength. Coordination normal. GCS eye subscore is 4. GCS verbal subscore is 5. GCS motor subscore is 6.  Skin: Skin is warm and intact.  The patient is mildly diaphoretic and pale.  Psychiatric: He has a normal mood and affect.    ED Course  Procedures (including critical care time) Labs Review Labs Reviewed  CBC - Abnormal; Notable for the following:    Platelets 135 (*)    All other components within normal limits  COMPREHENSIVE METABOLIC PANEL - Abnormal; Notable for the following:    Glucose, Bld 164 (*)    AST 234 (*)    ALT 112 (*)    Alkaline Phosphatase 178 (*)    Total Bilirubin 3.2 (*)    GFR calc non Af Amer 49 (*)    GFR calc Af Amer 56 (*)    All other components within normal limits  APTT  PROTIME-INR  HEPARIN LEVEL (UNFRACTIONATED)  CBC  I-STAT TROPOININ, ED  POC OCCULT BLOOD, ED    Imaging Review Dg Chest Portable 1 View  11/25/2014   CLINICAL DATA:  Chest pain, could STEMI  EXAM: PORTABLE CHEST - 1 VIEW  COMPARISON:  08/12/2014   FINDINGS: Lungs are essentially clear. No focal consolidation. No pleural effusion or pneumothorax.  Cardiomegaly.  Status post aortic valve replacement.  IMPRESSION: No evidence of  acute cardiopulmonary disease.   Electronically Signed   By: Charline Bills M.D.   On: 11/25/2014 19:15     EKG Interpretation   Date/Time:  Thursday November 25 2014 17:47:59 EST Ventricular Rate:  115 PR Interval:  145 QRS Duration: 131 QT Interval:  369 QTC Calculation: 510 R Axis:   -77 Text Interpretation:  Sinus or ectopic atrial tachycardia Ventricular  premature complex Nonspecific IVCD with LAD Inferior infarct, acute (RCA)  Probable RV involvement, suggest recording right precordial leads atrial  tachycardia.prior IVCD.  Confirmed by Donnald Garre, MD, Lebron Conners [phone]) on  11/25/2014 5:54:04 PM     Consult 18:13: Patient's case was discussed with Dr. Landry Mellow. At this time based on EKG findings and the patient's history the patient will be started on heparin and further evaluated in the emergency department.  CRITICAL CARE Performed by: Arby Barrette   Total critical care time: 40  Critical care time was exclusive of separately billable procedures and treating other patients.  Critical care was necessary to treat or prevent imminent or life-threatening deterioration.  Critical care was time spent personally by me on the following activities: development of treatment plan with patient and/or surrogate as well as nursing, discussions with consultants, evaluation of patient's response to treatment, examination of patient, obtaining history from patient or surrogate, ordering and performing treatments and interventions, ordering and review of laboratory studies, ordering and review of radiographic studies, pulse oximetry and re-evaluation of patient's condition. MDM   Final diagnoses:  Unstable angina  Non-intractable vomiting with nausea, vomiting of unspecified type  Dyspnea  Abnormal EKG    The patient presents on above at this point time there is high suspicion for cardiac ischemic event. Review of the patient's EMS EKG tracings are suspicious for inferior elevation. The patient has improved during his stay in the emergency department and is less symptomatic at this point. Dr. Landry Mellow reviewed his history and present findings. At this time with severe underlying coronary artery disease he felt that medical management was most appropriate and this has been initiated in emergency department. The patient will be admitted for ongoing medical management of unstable angina with known severe underlying disease.    Arby Barrette, MD 11/25/14 [phone]

## 2014-11-25 NOTE — ED Notes (Signed)
Pt presents from home via [facility] with c/o emesis that began one hour ago.  EMS responded and patient was actively vomiting and had auditory wheezing.  Pt give 4mg  IV Zofran, 10mg  Albuterol and .5mg  Atrovent PTA. PT denies pain. Dr. Donnald GarrePfeiffer at bedside.

## 2014-11-25 NOTE — ED Notes (Signed)
Cardiology at bedside.

## 2014-11-25 NOTE — ED Notes (Signed)
Consulting civil engineerCharge RN, Diplomatic Services operational officerecretary, and Xray notified of STEMI.

## 2014-11-25 NOTE — Progress Notes (Signed)
ANTICOAGULATION CONSULT NOTE - Initial Consult  Pharmacy Consult for heparin Indication: chest pain/ACS  Allergies  Allergen Reactions  . Penicillins Swelling and Other (See Comments)    REACTION: "turns red"  . Corn-Containing Products Other (See Comments)    Trouble digesting    Patient Measurements: Height: 5\' 7"  (170.2 cm) Weight: 288 lb (130.636 kg) IBW/kg (Calculated) : 66.1 Heparin Dosing Weight: 98kg  Vital Signs: Temp: 98.1 F (36.7 C) (02/18 1753) BP: 97/64 mmHg (02/18 1753) Pulse Rate: 117 (02/18 1753)  Labs: No results for input(s): HGB, HCT, PLT, APTT, LABPROT, INR, HEPARINUNFRC, CREATININE, CKTOTAL, CKMB, TROPONINI in the last 72 hours.  CrCl cannot be calculated (Patient has no serum creatinine result on file.).   Medical History: Past Medical History  Diagnosis Date  . Glaucoma     Right eye  . Diverticulosis     multiple hospital admissions for GI bleed, has an episode roughly every 6 months,, last  colonscopy in 2006 showed diverticulosis, was evaluated by Dr. Madilyn FiremanHayes in the hospital in 2011 for lower GI bleed andd it  was suggested that the patient was not actively bleeding at that time and given his comorbid conditions colonoscopy was deferred. He is also chronically constipated  . Macular degeneration of right eye   . Allergic rhinitis   . CAD (coronary artery disease)     a. h/o remote inferior MI, h/o PCI to RCA in 1991. b. syncope 2013: severe AS and high grade prox LAD and other diffuse disease. High risk for CABG - s/p BMS to prox LAD and balloon aortic valvuloplasty. c. cath 07/16/14 - diffuse CAD, managed medically.  . Hypertension   . Hyperlipidemia   . Chronic combined systolic and diastolic CHF (congestive heart failure)     a. Most recent echo 08/2014: EF 30-35%.  . Supraventricular tachycardia     2 syndromes-nonsustained atrial tachycardia//adenosine responsive diuretic positive reentry probably AV node reentry  . Aortic stenosis     a.  s/p AV balloon valvuloplasty by Dr. [name] 11/2011. b. s/p TAVR 08/2014.  . Ischemic cardiomyopathy   . H/O: GI bleed     recurrent  . Hypothyroidism   . GERD (gastroesophageal reflux disease)   . Falls frequently     3 times in the past week/notes 09/11/2013  . Myocardial infarction 1991; 10/28/2011  . Arthritis     "might have slight; have trouble w/my fingers" (09/11/2013)  . PAF (paroxysmal atrial fibrillation)   . Peripheral vascular disease     a. h/o ulcer on the left great toe s/p angioplasty of the left anterior tibial artery.  . OSA on CPAP     since 91  . Limited mobility   . 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 PAT (paroxysmal atrial tachycardia)     Assessment: 89 yom presented to the ED with emesis. To start IV heparin for possible NSTEMI. Baseline labs are pending but have been normal in the past. Pt is not on anticoagulation PTA.   Goal of Therapy:  Heparin level 0.3-0.7 units/ml Monitor platelets by anticoagulation protocol: Yes   Plan:  1. Heparin bolus 4000 units IV x 1 2. Heparin gt 1300 units/hr 3. Check an 8 hour heparin level 4. Daily heparin level and CBC  Amrit Erck, Drake Leachachel Lynn 11/25/2014,6:35 PM

## 2014-11-25 NOTE — ED Notes (Signed)
Pt's caregiver at bedside with Dr. Donnald GarrePfeiffer to determine plan of care.

## 2014-11-26 ENCOUNTER — Inpatient Hospital Stay (HOSPITAL_COMMUNITY): Payer: Medicare Other

## 2014-11-26 DIAGNOSIS — I2510 Atherosclerotic heart disease of native coronary artery without angina pectoris: Secondary | ICD-10-CM

## 2014-11-26 DIAGNOSIS — I483 Typical atrial flutter: Secondary | ICD-10-CM

## 2014-11-26 DIAGNOSIS — N390 Urinary tract infection, site not specified: Secondary | ICD-10-CM

## 2014-11-26 DIAGNOSIS — I420 Dilated cardiomyopathy: Secondary | ICD-10-CM

## 2014-11-26 LAB — HEPARIN LEVEL (UNFRACTIONATED)
HEPARIN UNFRACTIONATED: 0.77 [IU]/mL — AB (ref 0.30–0.70)
Heparin Unfractionated: 1.02 IU/mL — ABNORMAL HIGH (ref 0.30–0.70)
Heparin Unfractionated: 0.61 IU/mL (ref 0.30–0.70)

## 2014-11-26 LAB — BRAIN NATRIURETIC PEPTIDE: B Natriuretic Peptide: 136.2 pg/mL — ABNORMAL HIGH (ref 0.0–100.0)

## 2014-11-26 LAB — URINALYSIS, ROUTINE W REFLEX MICROSCOPIC
Glucose, UA: NEGATIVE mg/dL
KETONES UR: NEGATIVE mg/dL
Nitrite: POSITIVE — AB
PROTEIN: NEGATIVE mg/dL
Specific Gravity, Urine: 1.014 (ref 1.005–1.030)
Urobilinogen, UA: 1 mg/dL (ref 0.0–1.0)
pH: 5 (ref 5.0–8.0)

## 2014-11-26 LAB — GLUCOSE, CAPILLARY
Glucose-Capillary: 113 mg/dL — ABNORMAL HIGH (ref 70–99)
Glucose-Capillary: 129 mg/dL — ABNORMAL HIGH (ref 70–99)

## 2014-11-26 LAB — BLOOD GAS, ARTERIAL
Acid-base deficit: 4.3 mmol/L — ABNORMAL HIGH (ref 0.0–2.0)
BICARBONATE: 19.4 meq/L — AB (ref 20.0–24.0)
Drawn by: 252031
O2 Content: 2 L/min
O2 Saturation: 94.4 %
PATIENT TEMPERATURE: 98.6
PO2 ART: 75.8 mmHg — AB (ref 80.0–100.0)
TCO2: 20.3 mmol/L (ref 0–100)
pCO2 arterial: 30.3 mmHg — ABNORMAL LOW (ref 35.0–45.0)
pH, Arterial: 7.422 (ref 7.350–7.450)

## 2014-11-26 LAB — BASIC METABOLIC PANEL
Anion gap: 11 (ref 5–15)
BUN: 23 mg/dL (ref 6–23)
CO2: 24 mmol/L (ref 19–32)
Calcium: 8.9 mg/dL (ref 8.4–10.5)
Chloride: 104 mmol/L (ref 96–112)
Creatinine, Ser: 1.39 mg/dL — ABNORMAL HIGH (ref 0.50–1.35)
GFR calc Af Amer: 51 mL/min — ABNORMAL LOW (ref >90)
GFR calc non Af Amer: 44 mL/min — ABNORMAL LOW (ref >90)
Glucose, Bld: 122 mg/dL — ABNORMAL HIGH (ref 70–99)
Potassium: 3.7 mmol/L (ref 3.5–5.1)
Sodium: 139 mmol/L (ref 135–145)

## 2014-11-26 LAB — TSH: TSH: 1.248 u[IU]/mL (ref 0.350–4.500)

## 2014-11-26 LAB — CBC
HCT: 41.1 % (ref 39.0–52.0)
Hemoglobin: 13.6 g/dL (ref 13.0–17.0)
MCH: 31.4 pg (ref 26.0–34.0)
MCHC: 33.1 g/dL (ref 30.0–36.0)
MCV: 94.9 fL (ref 78.0–100.0)
Platelets: 132 10*3/uL — ABNORMAL LOW (ref 150–400)
RBC: 4.33 MIL/uL (ref 4.22–5.81)
RDW: 14.2 % (ref 11.5–15.5)
WBC: 13.1 10*3/uL — ABNORMAL HIGH (ref 4.0–10.5)

## 2014-11-26 LAB — TROPONIN I
Troponin I: 0.04 ng/mL — ABNORMAL HIGH (ref <0.031)
Troponin I: 0.07 ng/mL — ABNORMAL HIGH (ref <0.031)
Troponin I: 0.07 ng/mL — ABNORMAL HIGH (ref <0.031)

## 2014-11-26 LAB — URINE MICROSCOPIC-ADD ON

## 2014-11-26 LAB — T4, FREE: Free T4: 2.1 ng/dL — ABNORMAL HIGH (ref 0.80–1.80)

## 2014-11-26 MED ORDER — HEPARIN (PORCINE) IN NACL 100-0.45 UNIT/ML-% IJ SOLN
1000.0000 [IU]/h | INTRAMUSCULAR | Status: DC
  Administered 2014-11-26: 1000 [IU]/h via INTRAVENOUS
  Filled 2014-11-26: qty 250

## 2014-11-26 MED ORDER — FUROSEMIDE 10 MG/ML IJ SOLN
60.0000 mg | Freq: Once | INTRAMUSCULAR | Status: AC
  Administered 2014-11-26: 60 mg via INTRAVENOUS

## 2014-11-26 MED ORDER — CIPROFLOXACIN IN D5W 400 MG/200ML IV SOLN
400.0000 mg | Freq: Two times a day (BID) | INTRAVENOUS | Status: DC
Start: 2014-11-26 — End: 2014-11-28
  Administered 2014-11-26 – 2014-11-28 (×5): 400 mg via INTRAVENOUS
  Filled 2014-11-26 (×6): qty 200

## 2014-11-26 MED ORDER — FUROSEMIDE 10 MG/ML IJ SOLN
INTRAMUSCULAR | Status: AC
  Administered 2014-11-26: 60 mg via INTRAVENOUS
  Filled 2014-11-26: qty 2

## 2014-11-26 MED ORDER — NITROGLYCERIN IN D5W 200-5 MCG/ML-% IV SOLN: 0.0000 ug/min | INTRAVENOUS | Status: DC

## 2014-11-26 MED ORDER — LEVALBUTEROL HCL 0.63 MG/3ML IN NEBU
0.6300 mg | INHALATION_SOLUTION | RESPIRATORY_TRACT | Status: DC | PRN
  Administered 2014-11-26 (×2): 0.63 mg via RESPIRATORY_TRACT
  Filled 2014-11-26: qty 3

## 2014-11-26 MED ORDER — BISACODYL 5 MG PO TBEC: 5.0000 mg | DELAYED_RELEASE_TABLET | Freq: Every day | ORAL | Status: DC | PRN

## 2014-11-26 MED ORDER — FUROSEMIDE 10 MG/ML IJ SOLN
INTRAMUSCULAR | Status: AC
  Filled 2014-11-26: qty 4

## 2014-11-26 MED ORDER — POLYETHYLENE GLYCOL 3350 17 G PO PACK
17.0000 g | PACK | Freq: Every day | ORAL | Status: DC
  Administered 2014-11-26 – 2014-12-02 (×7): 17 g via ORAL
  Filled 2014-11-26 (×7): qty 1

## 2014-11-26 NOTE — Progress Notes (Signed)
ANTICOAGULATION CONSULT NOTE - Follow Up Consult  Pharmacy Consult for Heparin Indication: aflutter   Recent Labs  11/25/14 1816 11/25/14 2258 11/26/14 0037 11/26/14 0337 11/26/14 0853 11/26/14 1210 11/26/14 2235  HGB 16.3  --  13.6  --   --   --   --   HCT 48.4  --  41.1  --   --   --   --   PLT 135*  --  132*  --   --   --   --   APTT 25  --   --   --   --   --   --   LABPROT 13.7  --   --   --   --   --   --   INR 1.03  --   --   --   --   --   --   HEPARINUNFRC  --   --   --  0.77*  --  1.02* 0.61  CREATININE 1.27  --  1.39*  --   --   --   --   TROPONINI  --  0.04*  --  0.07* 0.07*  --   --     Estimated Creatinine Clearance: 39.3 mL/min (by C-G formula based on Cr of 1.39).   Medications:  Heparin @ 1000 units/hr  Assessment: 89yom started on heparin for aflutter. Heparin level therapeutic. Noted plan to switch to eliquis tomorrow.  Goal of Therapy:  Heparin level 0.3-0.7 units/ml Monitor platelets by anticoagulation protocol: Yes   Plan:  1) Cont heparin 1000 units/hr 2)  F/u am labs 3) Follow up transition to oral AC  Seairra Otani Poteet 11/26/2014,11:27 PM

## 2014-11-26 NOTE — Progress Notes (Signed)
ANTICOAGULATION CONSULT NOTE   Pharmacy Consult for heparin Indication: chest pain/ACS  Allergies  Allergen Reactions  . Penicillins Swelling and Other (See Comments)    REACTION: "turns red"  . Corn-Containing Products Other (See Comments)    Trouble digesting    Patient Measurements: Height:  (170.2 cm) Weight: 198 lb 10.2 oz (90.1 kg) IBW/kg (Calculated) : 66.1 Heparin Dosing Weight: 98kg  Vital Signs: Temp: 101.7 F (38.7 C) (02/19 0330) Temp Source: Oral (02/19 0330) BP: 111/53 mmHg (02/19 0545) Pulse Rate: 71 (02/19 0545)  Labs:  Recent Labs  11/25/14 1816 11/25/14 2258 11/26/14 0037 11/26/14 0337  HGB 16.3  --  13.6  --   HCT 48.4  --  41.1  --   PLT 135*  --  132*  --   APTT 25  --   --   --   LABPROT 13.7  --   --   --   INR 1.03  --   --   --   HEPARINUNFRC  --   --   --  0.77*  CREATININE 1.27  --   --   --   TROPONINI  --  0.04*  --   --     Estimated Creatinine Clearance: 43 mL/min (by C-G formula based on Cr of 1.27).   Medical History: Past Medical History  Diagnosis Date  . Glaucoma     Right eye  . Diverticulosis     multiple hospital admissions for GI bleed, has an episode roughly every 6 months,, last  colonscopy in 2006 showed diverticulosis, was evaluated by Dr. Madilyn Fireman in the hospital in 2011 for lower GI bleed andd it  was suggested that the patient was not actively bleeding at that time and given his comorbid conditions colonoscopy was deferred. He is also chronically constipated  . Macular degeneration of right eye   . Allergic rhinitis   . CAD (coronary artery disease)     a. h/o remote inferior MI, h/o PCI to RCA in 1991. b. syncope 2013: severe AS and high grade prox LAD and other diffuse disease. High risk for CABG - s/p BMS to prox LAD and balloon aortic valvuloplasty. c. cath 07/16/14 - diffuse CAD, managed medically.  . Hypertension   . Hyperlipidemia   . Chronic combined systolic and diastolic CHF (congestive heart  failure)     a. Most recent echo 08/2014: EF 30-35%.  . Supraventricular tachycardia     2 syndromes-nonsustained atrial tachycardia//adenosine responsive diuretic positive reentry probably AV node reentry  . Aortic stenosis     a. s/p AV balloon valvuloplasty by Dr. Excell Seltzer 11/2011. b. s/p TAVR 08/2014.  . Ischemic cardiomyopathy   . H/O: GI bleed     recurrent  . Hypothyroidism   . GERD (gastroesophageal reflux disease)   . Falls frequently     3 times in the past week/notes 09/11/2013  . Myocardial infarction 1991; 10/28/2011  . Arthritis     "might have slight; have trouble w/my fingers" (09/11/2013)  . PAF (paroxysmal atrial fibrillation)   . Peripheral vascular disease     a. h/o ulcer on the left great toe s/p angioplasty of the left anterior tibial artery.  . OSA on CPAP     since 91  . Limited mobility   . 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 PAT (paroxysmal atrial tachycardia)  Assessment: [age] yom presented to the ED with emesis. To start IV heparin for possible NSTEMI. Initial heparin level is slightly above goal.  Goal of Therapy:  Heparin level 0.3-0.7 units/ml Monitor platelets by anticoagulation protocol: Yes   Plan:  Decrease heparin to 1200 units/hr Check heparin level 6 hours after rate change  Eugene [name] 11/26/2014,5:55 AM

## 2014-11-26 NOTE — Progress Notes (Addendum)
eLink Physician-Brief Progress Note Patient Name: Eugene [name] DOB: 18-Sep-1924 MRN: [id number]   Date of Service  11/26/2014  HPI/Events of Note  resp distress,   eICU Interventions  Stat pcxr, abg, lasix Recent vomiting, no BIPAp as of now Cards on way to evaluate at bedside Control B if elevated, awaited, likley to need nitor     Intervention Category Major Interventions: Respiratory failure - evaluation and management  FEINSTEIN,DANIEL J. 11/26/2014, 2:02 AM

## 2014-11-26 NOTE — Progress Notes (Addendum)
Progress Note  Subjective:    Feeling better this AM. Had bowel movement, still w/ some nausea. No chest pain.   Objective:   Temp:  [97.5 F (36.4 C)-101.7 F (38.7 C)] 101.7 F (38.7 C) (02/19 0330) Pulse Rate:  [67-117] 68 (02/19 0615) Resp:  [13-36] 17 (02/19 0615) BP: (71-250)/(17-154) 100/44 mmHg (02/19 0615) SpO2:  [89 %-96 %] 94 % (02/19 0615) Weight:  [198 lb 10.2 oz (90.1 kg)-288 lb (130.636 kg)] 198 lb 10.2 oz (90.1 kg) (02/19 0500) Last BM Date: 11/25/14 (PTA)  Filed Weights   11/25/14 1753 11/25/14 2100 11/26/14 0500  Weight: 288 lb (130.636 kg) 199 lb 11.8 oz (90.6 kg) 198 lb 10.2 oz (90.1 kg)    Intake/Output Summary (Last 24 hours) at 11/26/14 0746 Last data filed at 11/26/14 0600  Gross per 24 hour  Intake 1098.9 ml  Output    775 ml  Net  323.9 ml    Telemetry: NSR  Physical Exam: General: Elderly male, alert, cooperative, NAD. HEENT: PERRL, EOMI. Moist mucus membranes Neck: Full range of motion without pain, supple, no lymphadenopathy or carotid bruits Lungs: Clear to ascultation bilaterally, normal work of respiration, no wheezes, rales, rhonchi Heart: RRR, no murmurs, gallops, or rubs Abdomen: Soft, non-tender, non-distended, BS + Extremities: No cyanosis, clubbing, or edema Neurologic: Alert & oriented x3, cranial nerves II-XII intact, strength grossly intact, sensation intact to light touch   Lab Results:  Basic Metabolic Panel:  Recent Labs Lab 11/25/14 1816 11/26/14 0037  NA 139 139  K 3.8 3.7  CL 103 104  CO2 22 24  GLUCOSE 164* 122*  BUN 22 23  CREATININE 1.27 1.39*  CALCIUM 9.6 8.9    Liver Function Tests:  Recent Labs Lab 11/25/14 1816  AST 234*  ALT 112*  ALKPHOS 178*  BILITOT 3.2*  PROT 6.7  ALBUMIN 3.9    CBC:  Recent Labs Lab 11/25/14 1816 11/26/14 0037  WBC 10.1 13.1*  HGB 16.3 13.6  HCT 48.4 41.1  MCV 95.5 94.9  PLT 135* 132*    Cardiac Enzymes:  Recent Labs Lab 11/25/14 2258  11/26/14 0337  TROPONINI 0.04* 0.07*    BNP:  Recent Labs  04/07/14 1544  PROBNP 1533.0*    Coagulation:  Recent Labs Lab 11/25/14 1816  INR 1.03    Radiology: Dg Chest Port 1 View  11/26/2014   CLINICAL DATA:  Respiratory distress  EXAM: PORTABLE CHEST - 1 VIEW  COMPARISON:  11/25/2014  FINDINGS: There is mild vascular fullness. No airspace opacities are evident. No large effusions are evident. There is moderate cardiomegaly and aortic tortuosity, unchanged.  IMPRESSION: Mild vascular fullness, perhaps a degree of congestive failure. No airspace opacities or large effusions.   Electronically Signed   By: Ellery Plunkaniel R Mitchell M.D.   On: 11/26/2014 02:14   Dg Chest Portable 1 View  11/25/2014   CLINICAL DATA:  Chest pain, could STEMI  EXAM: PORTABLE CHEST - 1 VIEW  COMPARISON:  08/12/2014  FINDINGS: Lungs are essentially clear. No focal consolidation. No pleural effusion or pneumothorax.  Cardiomegaly.  Status post aortic valve replacement.  IMPRESSION: No evidence of acute cardiopulmonary disease.   Electronically Signed   By: Charline BillsSriyesh  Krishnan M.D.   On: 11/25/2014 19:15      Medications:   Scheduled Medications: . aspirin  324 mg Oral NOW   Or  . aspirin  300 mg Rectal NOW  . aspirin EC  81 mg Oral Daily  . ciprofloxacin  400 mg Intravenous Q12H  . ferrous sulfate  325 mg Oral Q breakfast  . [START ON 11/27/2014] levothyroxine  25 mcg Oral QAC breakfast  . oxybutynin  5 mg Oral BID  . pantoprazole  40 mg Oral Daily  . pravastatin  40 mg Oral Daily  . terazosin  1 mg Oral QHS     Infusions: . sodium chloride 50 mL/hr at 11/25/14 2200  . amiodarone 30 mg/hr (11/26/14 0130)  . heparin 1,200 Units/hr (11/26/14 0600)  . 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   NSTEMI/Unstable Angina: Most likely related to severe RCA stenosis. EKG this AM shows 1st degree AV block, left-axis deviation and intraventricular conduction block. Troponins very mildly elevated as high as 0.07 this AM.  -Continue Heparin gtt for now.  No plan for repeat cath.  Known severely diseased RCA.  -Pravachol 40 mg daily -ASA  Atrial Flutter: Resolved at this time. Now NSR as described above.  -Continue Amiodarone gtt for now -Heparin gtt as above -ASA  ICM: ECHO from 10/19/14 shows EF of 20-25%, moderate LVH, diffuse hypokinesis, and akinesis of basal/midinferolateral myocardium. Also suggestive of some diastolic dysfunction as well. CXR this AM w/ some mild pulmonary vascular congestion. BNP only 136 this AM. Given Lasix 60 mg IV once overnight. Urine output ~1L since admission. Cr 1.39 this AM, increased from baseline. -Hold Lasix for now  Nausea/Vomiting: Possibly multifactorial involving unstable angina, however, may also be related to UTI. UA showed positive nitrites and many bacteria.  -Angina management as above -Cipro IV 400 mg bid  Hypothyroidism: TSH 1.248 on admission.  -Continue Synthroid     Lauris Chroman, MD PGY-2 Internal Medicine Pager: [phone]  I have examined the patient and reviewed assessment and plan and discussed with patient.  Agree with above as stated.  Consider Eliquis for flutter.  Will check with Dr. Excell Seltzer if that is ok from TAVR standpoint.  Watch Cr.  Hold Lasix.  Chronic left pleural effusion.  If renal insufficiency persists, may need to use Coumadin.  Cipro for UTI.  Anxiety better after he had a BM.  EF dropping as well by most recent cath.    Hendry Speas S.   Addendum: D/w Dr. Excell Seltzer.  Patient with h/o GI bleeding. COnsider low dose Eliquis tomorrow based on renal function and how he does with heparin overnight.

## 2014-11-26 NOTE — Care Management Note (Addendum)
    Page 1 of 2   12/02/2014     5:01:46 PM CARE MANAGEMENT NOTE 12/02/2014  Patient:  Delis,Neilan N   Account Number:  [id number]  Date Initiated:  11/26/2014  Documentation initiated by:  DOWELL,DEBBIE  Subjective/Objective Assessment:   adm w angina     Action/Plan:   lives w friend, pcp dr n narendra   Anticipated DC Date:  12/01/2014   Anticipated DC Plan:  HOME W HOME HEALTH SERVICES      DC Planning Services  CM consult      PAC Choice  HOME HEALTH   Choice offered to / List presented to:  C-2 HC POA / Guardian        HH arranged  HH-1 RN  HH-2 PT  HH-3 OT  HH-4 NURSE'S AIDE      HH agency  [facility]   Status of service:  Completed, signed off Medicare Important Message given?  YES (If response is "NO", the following Medicare IM given date fields will be blank) Date Medicare IM given:  11/29/2014 Medicare IM given by:  , Date Additional Medicare IM given:  12/02/2014 Additional Medicare IM given by:     Discharge Disposition:  HOME W HOME HEALTH SERVICES  Per UR Regulation:  Reviewed for med. necessity/level of care/duration of stay  If discussed at Long Length of Stay Meetings, dates discussed:   11/30/2014  12/02/2014    Comments:  12/02/14  , RN, BSN [phone] Pt for dc home today with caregiver, [name].  He states pt has hx of HH with [facility], and they will use again.  PA notified for HH orders, face to face.  Referral to [place] with [facility].  Start of care 24-48h post dc date.  11/29/14  , RN, BSN [phone] Met with pt's 24hr caregiver, [name].  He states he provides 24hr assistance for pt, and has for some time.  Pt does have niece and nephew in [place] who are POA, but they defer most decisions to [name].  He states they have used HH in the past--records indicate [facility] was used, and he is very open to having them come back in.  He states pt does better with HH care than SNF, as he  feels he gets up more. "I don't let him sit,  like they did at the nursing home." He relays that they have all needed DME at home.  Will cont to follow progress.   

## 2014-11-26 NOTE — Progress Notes (Addendum)
XC   Called to see patient due to nasuea and SOB.  Mr. Maisie Fushomas developed severe nausea assocaited with SOB.  Critical Care ordered lasix and ABG  JVP elevated on exam.  CXR reportedly with edema.  BP is 217/108.  Breathing comfortably now on 2L with O2 sat 93%.  Will bolus sl NTG and start IV nitro gtt to maintain SBP <160.  Will watch pressures cautiously due to concern for inferior ischemia on ECG.  Dossie ArbourEdward Krystyl Cannell, MD Cardiology Fellow  Now febrile with dirty u/a, will obtain blood and urine cultures and start IV cipro (PCN allergy)

## 2014-11-26 NOTE — Progress Notes (Signed)
ANTICOAGULATION CONSULT NOTE - Follow Up Consult  Pharmacy Consult for Heparin Indication: aflutter  Allergies  Allergen Reactions  . Penicillins Swelling and Other (See Comments)    REACTION: "turns red"  . Corn-Containing Products Other (See Comments)    Trouble digesting    Patient Measurements: Height: 5\' 7"  (170.2 cm) Weight: 198 lb 10.2 oz (90.1 kg) IBW/kg (Calculated) : 66.1 Heparin Dosing Weight: ~85kg  Vital Signs: Temp: 97.3 F (36.3 C) (02/19 1140) Temp Source: Oral (02/19 1140) BP: 88/64 mmHg (02/19 1300) Pulse Rate: 69 (02/19 1300)  Labs:  Recent Labs  11/25/14 1816 11/25/14 2258 11/26/14 0037 11/26/14 0337 11/26/14 0853 11/26/14 1210  HGB 16.3  --  13.6  --   --   --   HCT 48.4  --  41.1  --   --   --   PLT 135*  --  132*  --   --   --   APTT 25  --   --   --   --   --   LABPROT 13.7  --   --   --   --   --   INR 1.03  --   --   --   --   --   HEPARINUNFRC  --   --   --  0.77*  --  1.02*  CREATININE 1.27  --  1.39*  --   --   --   TROPONINI  --  0.04*  --  0.07* 0.07*  --     Estimated Creatinine Clearance: 39.3 mL/min (by C-G formula based on Cr of 1.39).   Medications:  Heparin @ 1200 units/hr  Assessment: 89yom started on heparin yesterday for aflutter. Initial heparin level was high and rate was decreased this morning. Follow up level has increased to 1.02 despite rate decrease. Verified with the patient that the level was drawn from opposite arm of heparin infusion. There is a weight discrepancy of ~90 pounds from yesterday to today (288-->199). This could account for the elevated heparin level. No bleeding reported. Noted plan to switch to eliquis tomorrow.  Goal of Therapy:  Heparin level 0.3-0.7 units/ml Monitor platelets by anticoagulation protocol: Yes   Plan:  1) Hold heparin for 30 minutes 2) Decrease rate to 1000 units/hr 3) Check 8 hour heparin level 4) Follow up transition to oral AC  Fredrik RiggerMarkle, Vearl Aitken Sue 11/26/2014,1:45  PM

## 2014-11-27 ENCOUNTER — Encounter (HOSPITAL_COMMUNITY): Payer: Self-pay

## 2014-11-27 LAB — CBC
HEMATOCRIT: 38 % — AB (ref 39.0–52.0)
Hemoglobin: 12.9 g/dL — ABNORMAL LOW (ref 13.0–17.0)
MCH: 32.1 pg (ref 26.0–34.0)
MCHC: 33.9 g/dL (ref 30.0–36.0)
MCV: 94.5 fL (ref 78.0–100.0)
Platelets: 95 10*3/uL — ABNORMAL LOW (ref 150–400)
RBC: 4.02 MIL/uL — ABNORMAL LOW (ref 4.22–5.81)
RDW: 15 % (ref 11.5–15.5)
WBC: 10.7 10*3/uL — AB (ref 4.0–10.5)

## 2014-11-27 LAB — BASIC METABOLIC PANEL
Anion gap: 5 (ref 5–15)
BUN: 28 mg/dL — AB (ref 6–23)
CHLORIDE: 104 mmol/L (ref 96–112)
CO2: 26 mmol/L (ref 19–32)
CREATININE: 1.4 mg/dL — AB (ref 0.50–1.35)
Calcium: 7.8 mg/dL — ABNORMAL LOW (ref 8.4–10.5)
GFR calc non Af Amer: 43 mL/min — ABNORMAL LOW (ref >90)
GFR, EST AFRICAN AMERICAN: 50 mL/min — AB (ref >90)
Glucose, Bld: 83 mg/dL (ref 70–99)
POTASSIUM: 3.7 mmol/L (ref 3.5–5.1)
Sodium: 135 mmol/L (ref 135–145)

## 2014-11-27 LAB — HEPARIN LEVEL (UNFRACTIONATED): HEPARIN UNFRACTIONATED: 0.42 [IU]/mL (ref 0.30–0.70)

## 2014-11-27 MED ORDER — TAMSULOSIN HCL 0.4 MG PO CAPS
0.4000 mg | ORAL_CAPSULE | Freq: Every day | ORAL | Status: DC
  Administered 2014-11-27 – 2014-12-02 (×6): 0.4 mg via ORAL
  Filled 2014-11-27 (×6): qty 1

## 2014-11-27 MED ORDER — APIXABAN 2.5 MG PO TABS
2.5000 mg | ORAL_TABLET | Freq: Two times a day (BID) | ORAL | Status: DC
  Administered 2014-11-27 – 2014-12-02 (×11): 2.5 mg via ORAL
  Filled 2014-11-27 (×12): qty 1

## 2014-11-27 MED ORDER — AMIODARONE HCL 200 MG PO TABS
200.0000 mg | ORAL_TABLET | Freq: Two times a day (BID) | ORAL | Status: DC
  Administered 2014-11-27 – 2014-12-02 (×11): 200 mg via ORAL
  Filled 2014-11-27 (×12): qty 1

## 2014-11-27 MED ORDER — CETYLPYRIDINIUM CHLORIDE 0.05 % MT LIQD
7.0000 mL | Freq: Two times a day (BID) | OROMUCOSAL | Status: DC
  Administered 2014-11-27 – 2014-12-02 (×10): 7 mL via OROMUCOSAL

## 2014-11-27 MED ORDER — FUROSEMIDE 40 MG PO TABS
40.0000 mg | ORAL_TABLET | Freq: Every day | ORAL | Status: DC
  Administered 2014-11-27 – 2014-12-02 (×6): 40 mg via ORAL
  Filled 2014-11-27 (×6): qty 1

## 2014-11-27 MED ORDER — LATANOPROST 0.005 % OP SOLN
1.0000 [drp] | Freq: Every day | OPHTHALMIC | Status: DC
  Administered 2014-11-27 – 2014-12-02 (×3): 1 [drp] via OPHTHALMIC
  Filled 2014-11-27 (×2): qty 2.5

## 2014-11-27 NOTE — Progress Notes (Signed)
CRITICAL VALUE ALERT  Critical value received:  Positive blood cultures  Date of notification:  11/26/14  Time of notification:  2154  Critical value read back: yes  Nurse who received alert:  Rivka SpringKaylee Swanson RN   MD notified : Cardiology Fellow on-call notified at 2156 11/26/14. No orders were placed.

## 2014-11-27 NOTE — Progress Notes (Addendum)
Progress Note  Subjective:    Started on IV cipro for UTI.  BCx 2/2 + GNR. Afebrile. WBC 13.1 -> 10.7  No further CP.  Nausea resolved. Maintaining NSR on IV amio. Trop peak 0.07.   Feeling better this AM. Wants to get up an walk.   Objective:   Temp:  [97.3 F (36.3 C)-98.3 F (36.8 C)] 97.3 F (36.3 C) (02/20 0800) Pulse Rate:  [58-75] 61 (02/20 0800) Resp:  [14-21] 17 (02/20 0800) BP: (85-158)/(24-70) 127/60 mmHg (02/20 0800) SpO2:  [93 %-97 %] 94 % (02/20 0800) Weight:  [91.1 kg (200 lb 13.4 oz)] 91.1 kg (200 lb 13.4 oz) (02/20 0453) Last BM Date: 11/26/14  Filed Weights   11/25/14 2100 11/26/14 0500 11/27/14 0453  Weight: 90.6 kg (199 lb 11.8 oz) 90.1 kg (198 lb 10.2 oz) 91.1 kg (200 lb 13.4 oz)    Intake/Output Summary (Last 24 hours) at 11/27/14 8295 Last data filed at 11/27/14 0800  Gross per 24 hour  Intake 2562.4 ml  Output   2525 ml  Net   37.4 ml    Telemetry: NSR  Physical Exam: General: Elderly male, alert, cooperative, NAD. HEENT: PERRL, EOMI. Moist mucus membranes Neck: Full range of motion without pain, supple, no lymphadenopathy or carotid bruits Lungs: Clear to ascultation bilaterally, normal work of respiration, no wheezes, rales, rhonchi Heart: RRR, no murmurs, gallops, or rubs Abdomen: Soft, non-tender, non-distended, BS + Extremities: No cyanosis, clubbing, or edema Neurologic: Alert & oriented x3, cranial nerves II-XII intact, strength grossly intact, sensation intact to light touch   Lab Results:  Basic Metabolic Panel:  Recent Labs Lab 11/25/14 1816 11/26/14 0037 11/27/14 0419  NA 139 139 135  K 3.8 3.7 3.7  CL 103 104 104  CO2 GLUCOSE 164* 122* 83  BUN 22 23 28*  CREATININE 1.27 1.39* 1.40*  CALCIUM 9.6 8.9 7.8*    Liver Function Tests:  Recent Labs Lab 11/25/14 1816  AST 234*  ALT 112*  ALKPHOS 178*  BILITOT 3.2*  PROT 6.7  ALBUMIN 3.9    CBC:  Recent Labs Lab 11/25/14 1816 11/26/14 0037  11/27/14 0419  WBC 10.1 13.1* 10.7*  HGB 16.3 13.6 12.9*  HCT 48.4 41.1 38.0*  MCV 95.5 94.9 94.5  PLT 135* 132* 95*    Cardiac Enzymes:  Recent Labs Lab 11/25/14 2258 11/26/14 0337 11/26/14 0853  TROPONINI 0.04* 0.07* 0.07*    BNP:  Recent Labs  04/07/14 1544  PROBNP 1533.0*    Coagulation:  Recent Labs Lab 11/25/14 1816  INR 1.03    Radiology: Dg Chest Port 1 View  11/26/2014   CLINICAL DATA:  Respiratory distress  EXAM: PORTABLE CHEST - 1 VIEW  COMPARISON:  11/25/2014  FINDINGS: There is mild vascular fullness. No airspace opacities are evident. No large effusions are evident. There is moderate cardiomegaly and aortic tortuosity, unchanged.  IMPRESSION: Mild vascular fullness, perhaps a degree of congestive failure. No airspace opacities or large effusions.   Electronically Signed   By: Ellery Plunk M.D.   On: 11/26/2014 02:14   Dg Chest Portable 1 View  11/25/2014   CLINICAL DATA:  Chest pain, could STEMI  EXAM: PORTABLE CHEST - 1 VIEW  COMPARISON:  08/12/2014  FINDINGS: Lungs are essentially clear. No focal consolidation. No pleural effusion or pneumothorax.  Cardiomegaly.  Status post aortic valve replacement.  IMPRESSION: No evidence of acute cardiopulmonary disease.   Electronically Signed   By: Lurlean Horns  Rito EhrlichKrishnan M.D.   On: 11/25/2014 19:15      Medications:   Scheduled Medications: . antiseptic oral rinse  7 mL Mouth Rinse BID  . aspirin EC  81 mg Oral Daily  . ciprofloxacin  400 mg Intravenous Q12H  . ferrous sulfate  325 mg Oral Q breakfast  . levothyroxine  25 mcg Oral QAC breakfast  . oxybutynin  5 mg Oral BID  . pantoprazole  40 mg Oral Daily  . polyethylene glycol  17 g Oral Daily  . pravastatin  40 mg Oral Daily  . terazosin  1 mg Oral QHS    Infusions: . sodium chloride 50 mL/hr at 11/27/14 0800  . amiodarone 30 mg/hr (11/27/14 0800)  . heparin 1,000 Units/hr (11/27/14 0800)  . 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   NSTEMI/Unstable Angina: Most likely related to severe RCA stenosis in setting of AFL. Trop peaked at 0.07 -No plan for repeat cath.  Treat medically. Switch heparin to Eliquis 2.5 bid d/t AFL -Pravachol 40 mg daily -ASA 81  Atrial Flutter: Resolved at this time. Now NSR as described above.  -Change amio to po. Start eliquis  ICM: ECHO from 10/19/14 shows EF of 20-25%, moderate LVH, diffuse hypokinesis, and akinesis of basal/midinferolateral myocardium. Also suggestive of some diastolic dysfunction as well.  -Volume status looks good.  -Resume oral lasix -Consider carvedilol in am  Hypothyroidism: TSH 1.248 on admission.  -Continue Synthroid  UTI with urosepsis - On iv cipro. WBC coming down. Await micro I&D - Remove Foley. Start Flomax for urinary retention.   Deconditioning -PT consult  Daniel Bensimhon,MD 10:20 AM

## 2014-11-28 DIAGNOSIS — I4892 Unspecified atrial flutter: Principal | ICD-10-CM

## 2014-11-28 DIAGNOSIS — R7881 Bacteremia: Secondary | ICD-10-CM

## 2014-11-28 LAB — CBC
HEMATOCRIT: 39.6 % (ref 39.0–52.0)
Hemoglobin: 13.2 g/dL (ref 13.0–17.0)
MCH: 31.3 pg (ref 26.0–34.0)
MCHC: 33.3 g/dL (ref 30.0–36.0)
MCV: 93.8 fL (ref 78.0–100.0)
PLATELETS: 100 10*3/uL — AB (ref 150–400)
RBC: 4.22 MIL/uL (ref 4.22–5.81)
RDW: 14.9 % (ref 11.5–15.5)
WBC: 7.3 10*3/uL (ref 4.0–10.5)

## 2014-11-28 LAB — URINE CULTURE: Colony Count: >=100000

## 2014-11-28 LAB — HEPARIN LEVEL (UNFRACTIONATED): HEPARIN UNFRACTIONATED: 1.1 [IU]/mL — AB (ref 0.30–0.70)

## 2014-11-28 MED ORDER — LEVOFLOXACIN 750 MG PO TABS
750.0000 mg | ORAL_TABLET | ORAL | Status: DC
  Administered 2014-11-29: 750 mg via ORAL
  Filled 2014-11-28: qty 1

## 2014-11-28 NOTE — Progress Notes (Signed)
Report called to [facility] RN. Belongings packed up and sent with patient.

## 2014-11-28 NOTE — Progress Notes (Signed)
ANTIBIOTIC CONSULT NOTE - INITIAL  Pharmacy Consult for Levaquin Indication: UTI  Allergies  Allergen Reactions  . Penicillins Swelling and Other (See Comments)    REACTION: "turns red"  . Corn-Containing Products Other (See Comments)    Trouble digesting    Patient Measurements: Height: 5\' 7"  (170.2 cm) Weight: 198 lb 13.7 oz (90.2 kg) IBW/kg (Calculated) : 66.1  Vital Signs: Temp: 97.4 F (36.3 C) (02/21 0800) Temp Source: Oral (02/21 0800) BP: 110/66 mmHg (02/21 1100) Pulse Rate: 81 (02/21 1100) Intake/Output from previous day: 02/20 0701 - 02/21 0700 In: 1590.1 [P.O.:960; I.V.:230.1; IV Piggyback:400] Out: 1275 [Urine:1275] Intake/Output from this shift: Total I/O In: 600 [P.O.:400; IV Piggyback:200] Out: 200 [Urine:200]  Labs:  Recent Labs  11/25/14 1816 11/26/14 0037 11/27/14 0419 11/28/14 0320  WBC 10.1 13.1* 10.7* 7.3  HGB 16.3 13.6 12.9* 13.2  PLT 135* 132* 95* 100*  CREATININE 1.27 1.39* 1.40*  --    Estimated Creatinine Clearance: 39.1 mL/min (by C-G formula based on Cr of 1.4). No results for input(s): VANCOTROUGH, VANCOPEAK, VANCORANDOM, GENTTROUGH, GENTPEAK, GENTRANDOM, TOBRATROUGH, TOBRAPEAK, TOBRARND, AMIKACINPEAK, AMIKACINTROU, AMIKACIN in the last 72 hours.   Microbiology: Recent Results (from the past 720 hour(s))  MRSA PCR Screening     Status: None   Collection Time: 11/25/14  9:12 PM  Result Value Ref Range Status   MRSA by PCR NEGATIVE NEGATIVE Final    Comment:        The GeneXpert MRSA Assay (FDA approved for NASAL specimens only), is one component of a comprehensive MRSA colonization surveillance program. It is not intended to diagnose MRSA infection nor to guide or monitor treatment for MRSA infections.   Culture, Urine     Status: None (Preliminary result)   Collection Time: 11/26/14  4:08 AM  Result Value Ref Range Status   Specimen Description URINE, CATHETERIZED  Final   Special Requests NONE  Final   Colony Count    Final    >=100,000 COLONIES/ML Performed at [facility]    Culture   Final    GRAM NEGATIVE RODS Performed at [facility]    Report Status PENDING  Incomplete  Culture, blood (routine x 2)     Status: None (Preliminary result)   Collection Time: 11/26/14  4:42 AM  Result Value Ref Range Status   Specimen Description BLOOD RIGHT ARM  Final   Special Requests   Final    BOTTLES DRAWN AEROBIC AND ANAEROBIC 6CC BLUE 3CC RED   Culture   Final    GRAM NEGATIVE RODS Note: Gram Stain Report Called to,Read Back By and Verified With: Boyd KerbsHAILEY SWANSON RN 950P Performed at [facility]    Report Status PENDING  Incomplete  Culture, blood (routine x 2)     Status: None (Preliminary result)   Collection Time: 11/26/14  4:42 AM  Result Value Ref Range Status   Specimen Description BLOOD RIGHT HAND  Final   Special Requests BOTTLES DRAWN AEROBIC ONLY 3CC  Final   Culture   Final    GRAM NEGATIVE RODS Note: Gram Stain Report Called to,Read Back By and Verified With: Boyd KerbsHAILEY SWANSON RN 950P Performed at [facility]    Report Status PENDING  Incomplete    Medical History: Past Medical History  Diagnosis Date  . Glaucoma     Right eye  . Diverticulosis     multiple hospital admissions for GI bleed, has an episode roughly every 6 months,, last  colonscopy in  2006 showed diverticulosis, was evaluated by Dr. Madilyn Fireman in the hospital in 2011 for lower GI bleed andd it  was suggested that the patient was not actively bleeding at that time and given his comorbid conditions colonoscopy was deferred. He is also chronically constipated  . Macular degeneration of right eye   . Allergic rhinitis   . CAD (coronary artery disease)     a. h/o remote inferior MI, h/o PCI to RCA in 1991. b. syncope 2013: severe AS and high grade prox LAD and other diffuse disease. High risk for CABG - s/p BMS to prox LAD and balloon aortic valvuloplasty. c. cath 07/16/14 - diffuse CAD, managed  medically.  . Hypertension   . Hyperlipidemia   . Chronic combined systolic and diastolic CHF (congestive heart failure)     a. Most recent echo 08/2014: EF 30-35%.  . Supraventricular tachycardia     2 syndromes-nonsustained atrial tachycardia//adenosine responsive diuretic positive reentry probably AV node reentry  . Aortic stenosis     a. s/p AV balloon valvuloplasty by Dr. Excell Seltzer 11/2011. b. s/p TAVR 08/2014.  . Ischemic cardiomyopathy   . H/O: GI bleed     recurrent  . Hypothyroidism   . GERD (gastroesophageal reflux disease)   . Falls frequently     3 times in the past week/notes 09/11/2013  . Myocardial infarction 1991; 10/28/2011  . Arthritis     "might have slight; have trouble w/my fingers" (09/11/2013)  . PAF (paroxysmal atrial fibrillation)   . Peripheral vascular disease     a. h/o ulcer on the left great toe s/p angioplasty of the left anterior tibial artery.  . OSA on CPAP     since 91  . Limited mobility   . 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 PAT (paroxysmal atrial tachycardia)     Medications:  Scheduled:  . amiodarone  200 mg Oral BID  . antiseptic oral rinse  7 mL Mouth Rinse BID  . apixaban  2.5 mg Oral BID  . aspirin EC  81 mg Oral Daily  . ferrous sulfate  325 mg Oral Q breakfast  . furosemide  40 mg Oral Daily  . latanoprost  1 drop Right Eye QHS  . [START ON 11/29/2014] levofloxacin  750 mg Oral Q48H  . levothyroxine  25 mcg Oral QAC breakfast  . oxybutynin  5 mg Oral BID  . pantoprazole  40 mg Oral Daily  . polyethylene glycol  17 g Oral Daily  . pravastatin  40 mg Oral Daily  . tamsulosin  0.4 mg Oral Daily   Assessment: 81 yo m admitted for emesis.  Patient has been getting cipro 400 mg IV q12h for 3 days.  Transition to oral levofloxacin for discharge.  Due to patients renal function and age, will start 750 mg PO q48h. Wbc  wnl, afebrile, SCr 1.40 (was 1.2), CrCl ~40 ml/min.   2/19: UCx: GNR 2/20: BCx: GNR  Goal of Therapy:  Eradication of infection  Plan:  Levofloxacin 750 mg PO q48h Follow up cultures before discharge Monitor renal function closely  Cassie L. Roseanne Reno, PharmD Clinical Pharmacy Resident Pager: [phone] 11/28/2014 11:27 AM

## 2014-11-28 NOTE — Progress Notes (Signed)
Progress Note  Subjective:    Started on IV cipro on 2/19 for UTI.  Ucx > 100K GNR  BCx 2/2 + GNR. Afebrile. WBC falling.   No further CP.  Tolerating Eliquis,   Feeling better this AM. Breathing ok. No fevers or chills.   Objective:   Temp:  [97.4 F (36.3 C)-98.4 F (36.9 C)] 97.4 F (36.3 C) (02/21 0800) Pulse Rate:  [36-94] 88 (02/21 1008) Resp:  [14-26] 17 (02/21 1008) BP: (110-166)/(51-97) 128/89 mmHg (02/21 1008) SpO2:  [90 %-97 %] 94 % (02/21 1008) Weight:  [90.2 kg (198 lb 13.7 oz)] 90.2 kg (198 lb 13.7 oz) (02/21 0500) Last BM Date: 11/26/14  Filed Weights   11/26/14 0500 11/27/14 0453 11/28/14 0500  Weight: 90.1 kg (198 lb 10.2 oz) 91.1 kg (200 lb 13.4 oz) 90.2 kg (198 lb 13.7 oz)    Intake/Output Summary (Last 24 hours) at 11/28/14 1053 Last data filed at 11/28/14 1000  Gross per 24 hour  Intake   1330 ml  Output   1475 ml  Net   -145 ml    Telemetry: NSR  Physical Exam: General: Elderly male, alert, cooperative, NAD. HEENT: PERRL, EOMI. Moist mucus membranes Neck: Full range of motion without pain, supple, no lymphadenopathy or carotid bruits Lungs: Clear to ascultation bilaterally, normal work of respiration, no wheezes, rales, rhonchi Heart: RRR, no murmurs, gallops, or rubs Abdomen: Soft, non-tender, non-distended, BS + Extremities: No cyanosis, clubbing, or edema Neurologic: Alert & oriented x3, cranial nerves II-XII intact, strength grossly intact, sensation intact to light touch   Lab Results:  Basic Metabolic Panel:  Recent Labs Lab 11/25/14 1816 11/26/14 0037 11/27/14 0419  NA 139 139 135  K 3.8 3.7 3.7  CL 103 104 104  CO2 GLUCOSE 164* 122* 83  BUN 22 23 28*  CREATININE 1.27 1.39* 1.40*  CALCIUM 9.6 8.9 7.8*    Liver Function Tests:  Recent Labs Lab 11/25/14 1816  AST 234*  ALT 112*  ALKPHOS 178*  BILITOT 3.2*  PROT 6.7  ALBUMIN 3.9    CBC:  Recent Labs Lab 11/26/14 0037 11/27/14 0419  11/28/14 0320  WBC 13.1* 10.7* 7.3  HGB 13.6 12.9* 13.2  HCT 41.1 38.0* 39.6  MCV 94.9 94.5 93.8  PLT 132* 95* 100*    Cardiac Enzymes:  Recent Labs Lab 11/25/14 2258 11/26/14 0337 11/26/14 0853  TROPONINI 0.04* 0.07* 0.07*    BNP:  Recent Labs  04/07/14 1544  PROBNP 1533.0*    Coagulation:  Recent Labs Lab 11/25/14 1816  INR 1.03    Radiology: No results found.    Medications:   Scheduled Medications: . amiodarone  200 mg Oral BID  . antiseptic oral rinse  7 mL Mouth Rinse BID  . apixaban  2.5 mg Oral BID  . aspirin EC  81 mg Oral Daily  . ciprofloxacin  400 mg Intravenous Q12H  . ferrous sulfate  325 mg Oral Q breakfast  . furosemide  40 mg Oral Daily  . latanoprost  1 drop Right Eye QHS  . levothyroxine  25 mcg Oral QAC breakfast  . oxybutynin  5 mg Oral BID  . pantoprazole  40 mg Oral Daily  . polyethylene glycol  17 g Oral Daily  . pravastatin  40 mg Oral Daily  . tamsulosin  0.4 mg Oral Daily    Infusions: . 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   NSTEMI/Unstable Angina: Most likely related to severe RCA stenosis in setting of AFL. Trop peaked at 0.07 -No plan for repeat cath.  Treat medically. Switch heparin to Eliquis 2.5 bid d/t AFL -Pravachol 40 mg daily -ASA 81  Atrial Flutter: Resolved at this time. Now NSR as described above.  -On oral amio and po Eliquis.  ICM: ECHO from 10/19/14 shows EF of 20-25%, moderate LVH,  diffuse hypokinesis, and akinesis of basal/midinferolateral myocardium. Also suggestive of some diastolic dysfunction as well.  -Volume status looks good.  -Back on oral lasix -Will start low-dose carvedilol - NO ace/arb due to renal failure.   Hypothyroidism: TSH 1.248 on admission.  -Continue Synthroid  UTI with urosepsis - On iv cipro. WBC coming down. Await micro I&D. Will change to po Levofloxacin 500 daily for 8 more days. Pharmacy to check dose. Await final micro ID and sensitivities.  - Foley out. Started Flomax for urinary retention. (was on terazosin at home - switched to Flomax due to low BP)  Deconditioning -PT consult  Disposition: To tele today. Possibly home in am. Await final culture and sensitivities.   Euriah Matlack,MD 10:53 AM

## 2014-11-28 NOTE — Evaluation (Signed)
Physical Therapy Evaluation Patient Details Name: Eugene [name] MRN: [id number] DOB: 07-23-1924 Today's Date: 11/28/2014   History of Present Illness  Pt admitted with unstable angina pectoris, CAD, status postTAVR (08-10-14), and atrial flutter w 3:1 AVB.  Clinical Impression  Pt admitted with above diagnosis. Pt currently with functional limitations due to the deficits listed below (see PT Problem List).  Pt will benefit from skilled PT to increase their independence and safety with mobility to allow discharge to the venue listed below.  Pt declining SNF but may be necessary depending on progress and level of assist pt's caregiver is able to provide at home.    Follow Up Recommendations Home health PT;Supervision/Assistance - 24 hour    Equipment Recommendations  None recommended by PT    Recommendations for Other Services       Precautions / Restrictions Precautions Precautions: Fall      Mobility  Bed Mobility Overal bed mobility: Needs Assistance             General bed mobility comments: Pt received in recliner.  Transfers Overall transfer level: Needs assistance Equipment used: Rolling walker (2 wheeled) Transfers: Sit to/from [employer] Sit to Stand: Mod assist Stand pivot transfers: Mod assist       General transfer comment: Pt shaking, with bilat knee buckling.  He reports being fatigued from switching rooms (units) today.  Ambulation/Gait             General Gait Details: unable to address ambulation due to fatigue.  Stairs            Wheelchair Mobility    Modified Rankin (Stroke Patients Only)       Balance Overall balance assessment: Needs assistance Sitting-balance support: No upper extremity supported;Feet supported Sitting balance-Leahy Scale: Fair     Standing balance support: Bilateral upper extremity supported;During functional activity Standing balance-Leahy Scale: Poor                               Pertinent Vitals/Pain Pain Assessment: No/denies pain    Home Living Family/patient expects to be discharged to:: Private residence Living Arrangements: Non-relatives/Friends Kathlene November, caregiver) Available Help at Discharge: Friend(s);Available 24 hours/day Type of Home: House Home Access: Ramped entrance     Home Layout: Two level Home Equipment: Walker - 2 wheels;Walker - standard;Shower seat;Bedside commode;Adaptive equipment      Prior Function Level of Independence: Needs assistance   Gait / Transfers Assistance Needed: S with community distances           Hand Dominance   Dominant Hand: Right    Extremity/Trunk Assessment   Upper Extremity Assessment: Generalized weakness           Lower Extremity Assessment: Generalized weakness         Communication   Communication: HOH  Cognition Arousal/Alertness: Awake/alert Behavior During Therapy: WFL for tasks assessed/performed Overall Cognitive Status: Within Functional Limits for tasks assessed                      General Comments      Exercises        Assessment/Plan    PT Assessment Patient needs continued PT services  PT Diagnosis Difficulty walking;Generalized weakness   PT Problem List Decreased strength;Decreased activity tolerance;Decreased balance;Decreased mobility;Cardiopulmonary status limiting activity;Decreased safety awareness;Decreased cognition  PT Treatment Interventions DME instruction;Gait training;Functional mobility training;Therapeutic activities;Therapeutic exercise;Patient/family education;Balance training;Cognitive remediation   PT  Goals (Current goals can be found in the Care Plan section) Acute Rehab PT Goals Patient Stated Goal: home PT Goal Formulation: With patient Time For Goal Achievement: 12/12/14 Potential to Achieve Goals: Fair    Frequency Min 3X/week   Barriers to discharge        Co-evaluation               End of Session  Equipment Utilized During Treatment: Gait belt;Oxygen Activity Tolerance: Patient limited by fatigue Patient left: in chair;with call bell/phone within reach;with chair alarm set Nurse Communication: Mobility status         Time: [id number] PT Time Calculation (min) (ACUTE ONLY): 28 min   Charges:   PT Evaluation $Initial PT Evaluation Tier I: 1 Procedure PT Treatments $Therapeutic Activity: 8-22 mins   PT G Codes:        Ilda FoilGarrow, Myrtle Barnhard Rene 11/28/2014, 4:02 PM

## 2014-11-28 NOTE — Discharge Instructions (Signed)
Information on my medicine - ELIQUIS® (apixaban) ° °This medication education was reviewed with me or my healthcare representative as part of my discharge preparation.  The pharmacist that spoke with me during my hospital stay was:  Stewart, Wm Sahagun L, RPH ° °Why was Eliquis® prescribed for you? °Eliquis® was prescribed for you to reduce the risk of a blood clot forming that can cause a stroke if you have a medical condition called atrial fibrillation (a type of irregular heartbeat). ° °What do You need to know about Eliquis® ? °Take your Eliquis® TWICE DAILY - one tablet in the morning and one tablet in the evening with or without food. If you have difficulty swallowing the tablet whole please discuss with your pharmacist how to take the medication safely. ° °Take Eliquis® exactly as prescribed by your doctor and DO NOT stop taking Eliquis® without talking to the doctor who prescribed the medication.  Stopping may increase your risk of developing a stroke.  Refill your prescription before you run out. ° °After discharge, you should have regular check-up appointments with your healthcare provider that is prescribing your Eliquis®.  In the future your dose may need to be changed if your kidney function or weight changes by a significant amount or as you get older. ° °What do you do if you miss a dose? °If you miss a dose, take it as soon as you remember on the same day and resume taking twice daily.  Do not take more than one dose of ELIQUIS at the same time to make up a missed dose. ° °Important Safety Information °A possible side effect of Eliquis® is bleeding. You should call your healthcare provider right away if you experience any of the following: °  Bleeding from an injury or your nose that does not stop. °  Unusual colored urine (red or dark brown) or unusual colored stools (red or black). °  Unusual bruising for unknown reasons. °  A serious fall or if you hit your head (even if there is no bleeding). ° °Some  medicines may interact with Eliquis® and might increase your risk of bleeding or clotting while on Eliquis®. To help avoid this, consult your healthcare provider or pharmacist prior to using any new prescription or non-prescription medications, including herbals, vitamins, non-steroidal anti-inflammatory drugs (NSAIDs) and supplements. ° °This website has more information on Eliquis® (apixaban): http://www.eliquis.com/eliquis/home °

## 2014-11-28 NOTE — Progress Notes (Addendum)
Pt arrived the the unit from @ via wheelchair, pt ambulated to the recliner with assistance. Placed on O2 at [facility] Sarah2L, Mike caregiver at his bedside. Pt AAOx1 disoriented to place and time. Chair alarm in place.

## 2014-11-29 ENCOUNTER — Encounter (HOSPITAL_COMMUNITY): Payer: Self-pay | Admitting: Physician Assistant

## 2014-11-29 DIAGNOSIS — Z7901 Long term (current) use of anticoagulants: Secondary | ICD-10-CM

## 2014-11-29 DIAGNOSIS — I5043 Acute on chronic combined systolic (congestive) and diastolic (congestive) heart failure: Secondary | ICD-10-CM

## 2014-11-29 DIAGNOSIS — I4892 Unspecified atrial flutter: Secondary | ICD-10-CM | POA: Diagnosis present

## 2014-11-29 LAB — CULTURE, BLOOD (ROUTINE X 2)

## 2014-11-29 LAB — BASIC METABOLIC PANEL WITH GFR
Anion gap: 7 (ref 5–15)
BUN: 20 mg/dL (ref 6–23)
CO2: 27 mmol/L (ref 19–32)
Calcium: 8.8 mg/dL (ref 8.4–10.5)
Chloride: 102 mmol/L (ref 96–112)
Creatinine, Ser: 1.27 mg/dL (ref 0.50–1.35)
GFR calc Af Amer: 56 mL/min — ABNORMAL LOW
GFR calc non Af Amer: 49 mL/min — ABNORMAL LOW
Glucose, Bld: 92 mg/dL (ref 70–99)
Potassium: 3.6 mmol/L (ref 3.5–5.1)
Sodium: 136 mmol/L (ref 135–145)

## 2014-11-29 MED ORDER — ALPRAZOLAM 0.25 MG PO TABS
0.2500 mg | ORAL_TABLET | Freq: Three times a day (TID) | ORAL | Status: DC | PRN
  Administered 2014-11-29 – 2014-12-02 (×2): 0.25 mg via ORAL
  Filled 2014-11-29 (×2): qty 1

## 2014-11-29 MED ORDER — LEVOTHYROXINE SODIUM 25 MCG PO TABS
25.0000 ug | ORAL_TABLET | Freq: Every day | ORAL | Status: DC
  Administered 2014-11-30 – 2014-12-02 (×3): 25 ug via ORAL
  Filled 2014-11-29 (×4): qty 1

## 2014-11-29 NOTE — Progress Notes (Signed)
Patient had some disorientation throughout the night and wanted to go to the hospital and get in his bed. He was in his bed.  Patient did not complain of any aches or pains. Vitals are stable and I will continue to monitor.

## 2014-11-29 NOTE — Progress Notes (Signed)
Patient Name: Eugene [name] Date of Encounter: 11/29/2014  Principal Problem:   Atrial flutter with rapid ventricular response Active Problems:   Acute on chronic combined systolic and diastolic congestive heart failure   Unstable angina pectoris   Chronic anticoagulation   Primary Cardiologist: Dr. 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   SUBJECTIVE: Denies chest pain or SOB, feels weak.  OBJECTIVE Filed Vitals:   11/28/14 1506 11/28/14 2020 11/29/14 0153 11/29/14 0548  BP: 114/66 110/58 112/67 108/63  Pulse: 82 80 82 79  Temp: 97.8 F (36.6 C) 97.6 F (36.4 C) 97.5 F (36.4 C) 97.1 F (36.2 C)  TempSrc: Oral Oral Oral Oral  Resp: Height:  (1.702 m)     Weight: 200 lb 13.4 oz (91.1 kg)   198 lb 2.8 oz (89.891 kg)  SpO2: 94% 96% 98% 100%    Intake/Output Summary (Last 24 hours) at 11/29/14 0959 Last data filed at 11/29/14 0935  Gross per 24 hour  Intake  846.7 ml  Output   1275 ml  Net -428.3 ml   Filed Weights   11/28/14 0500 11/28/14 1506 11/29/14 0548  Weight: 198 lb 13.7 oz (90.2 kg) 200 lb 13.4 oz (91.1 kg) 198 lb 2.8 oz (89.891 kg)    PHYSICAL EXAM General: Well developed, well nourished, male in no acute distress. Head: Normocephalic, atraumatic.  Neck: Supple without bruits, JVD not elevated. Lungs:  Resp regular and unlabored, rales bases. Heart: RRR, S1, S2, no S3, S4, or murmur; no rub. Abdomen: Soft, non-tender, non-distended, BS + x 4.  Extremities: No clubbing, cyanosis, no edema.  Neuro: Alert and oriented X 3. Moves all extremities spontaneously. Psych: Normal  affect.  LABS: CBC:  Recent Labs  11/27/14 0419 11/28/14 0320  WBC 10.7* 7.3  HGB 12.9* 13.2  HCT 38.0* 39.6  MCV 94.5 93.8  PLT 95* 100*   Basic Metabolic Panel:  Recent Labs  41/32/44 0419 11/29/14 0455  NA 135 136  K 3.7 3.6  CL 104 102  CO2 26 27  GLUCOSE 83 92  BUN 28* 20  CREATININE 1.40* 1.27  CALCIUM 7.8* 8.8   BNP: B NATRIURETIC PEPTIDE  Date/Time Value Ref Range Status  11/26/2014 03:37 AM 136.2* 0.0 - 100.0 pg/mL Final   PRO B NATRIURETIC PEPTIDE (BNP)  Date/Time Value Ref Range Status  04/07/2014 03:44 PM 1533.0* 0 - 450 pg/mL Final  09/11/2013 01:16 PM 1443.0* 0 - 450 pg/mL Final   TROPONIN I  Date Value Ref Range Status  11/26/2014 0.07* <0.031 ng/mL Final    Comment:  11/26/2014 0.07* <0.031 ng/mL Final    Comment:  11/25/2014 0.04* <0.031 ng/mL Final    Comment:  04/08/2014 <0.30 <0.30 ng/mL Final    Comment:    TELE:  Atrial flutter, controlled V rate    Radiology/Studies: Dg Chest Port 1 View 11/26/2014   CLINICAL DATA:  Respiratory distress  EXAM: PORTABLE CHEST - 1 VIEW  COMPARISON:  11/25/2014  FINDINGS: There is mild vascular fullness. No airspace opacities are evident. No large effusions are evident. There is moderate cardiomegaly and aortic  tortuosity, unchanged.  IMPRESSION: Mild vascular fullness, perhaps a degree of congestive failure. No airspace opacities or large effusions.   Electronically Signed   By: Ellery Plunkaniel R Mitchell M.D.   On: 11/26/2014 02:14   Dg Chest Portable 1 View 11/25/2014   CLINICAL DATA:  Chest pain, could STEMI  EXAM: PORTABLE CHEST - 1 VIEW  COMPARISON:  08/12/2014  FINDINGS: Lungs are essentially clear. No focal consolidation. No pleural effusion or pneumothorax.  Cardiomegaly.  Status post aortic valve replacement.  IMPRESSION: No evidence of acute cardiopulmonary disease.   Electronically Signed   By: Charline BillsSriyesh  Krishnan M.D.   On: 11/25/2014 19:15   Current Medications:  . amiodarone  200 mg Oral BID  .  antiseptic oral rinse  7 mL Mouth Rinse BID  . apixaban  2.5 mg Oral BID  . aspirin EC  81 mg Oral Daily  . ferrous sulfate  325 mg Oral Q breakfast  . furosemide  40 mg Oral Daily  . latanoprost  1 drop Right Eye QHS  . levofloxacin  750 mg Oral Q48H  . levothyroxine  25 mcg Oral QAC breakfast  . oxybutynin  5 mg Oral BID  . pantoprazole  40 mg Oral Daily  . polyethylene glycol  17 g Oral Daily  . pravastatin  40 mg Oral Daily  . tamsulosin  0.4 mg Oral Daily    ASSESSMENT AND PLAN:   Atrial flutter with rapid ventricular response - controlled VR  Active Problems:   Acute on chronic combined systolic and diastolic congestive heart failure - feel he is at dry weight - continue oral Lasix, daily weights -  HHRN to do BMET later this week    Unstable angina pectoris - symptoms improved  - minimal elevation in troponin, likely demand ischemia - on statin, ASA    Chronic anticoagulation - Eliquis  UTI with urosepsis: continue Levaquin  Signed, Theodore Demarkhonda Barrett , PA-C 9:59 AM 11/29/2014  I have personally seen and examined this patient with Theodore Demarkhonda Barrett, PA-C. I agree with the assessment and plan as outlined above. He is doing better this am. Still having paroxysms of atrial flutter. Was in sinus at 245am but now back in atrial flutter. Rate controlled with amiodarone. Likely exacerbated by his UTI. On antibiotics and clinically improved. Will continue Eliquis. Probable discharge tomorrow 11/30/14.   Dyneshia Baccam 11/29/2014 10:18 AM

## 2014-11-29 NOTE — Progress Notes (Signed)
Physical Therapy Treatment Patient Details Name: Eugene [name] MRN: [id number] DOB: [birth year] Today's Date: [name]    History of Present Illness Eugene admitted with unstable angina pectoris, CAD, status postTAVR (08-10-14), and atrial flutter w 3:1 AVB.    Eugene Comments    Patient demonstrates significant deficits in functional mobility as indicated below. Patient requiring increased physical assist for ambulation (maximal assist) and continues to remain unsteady with poor awareness of safety and deficits. Eugene continues to be a high fall risk with poor safety awareness, generalized weakness and inability to stand without support.  SNF is the optimal discharge environment. Spoke with both case manage as well as LCSW. At this time highly recommend ST SNF, however, patient declining, caregiver states that he can provide 24/7 physical assist for patient. Therefore, patient will need HHPT services. Will continue to see and progress as tolerated.    Follow Up Recommendations  Home health Eugene;Supervision/Assistance - 24 hour (Rec SNF, but caregiver 24/7 request HHPT agreeable to asst)     Equipment Recommendations  None recommended by Eugene    Recommendations for Other Services       Precautions / Restrictions Precautions Precautions: Fall Restrictions Weight Bearing Restrictions: No    Mobility  Bed Mobility Overal bed mobility: Needs Assistance             General bed mobility comments: Eugene received in recliner.  Transfers Overall transfer level: Needs assistance Equipment used: Rolling walker (2 wheeled) Transfers: Sit to/from Stand Sit to Stand: Mod assist Stand pivot transfers: Mod assist       General transfer comment: Patient significantly unsteady, requiring moderate to maximal asssit to stabilize and prevent fall during transfer, BLE buckling   Ambulation/Gait Ambulation/Gait assistance: Max assist Ambulation Distance (Feet): 40 Feet Assistive device: Rolling walker  (2 wheeled) Gait Pattern/deviations: Scissoring;Staggering left;Staggering right;Trunk flexed Gait velocity: decreased Gait velocity interpretation: <1.8 ft/sec, indicative of risk for recurrent falls General Gait Details: Patient very unsteady with gait, maximal assist to prevent fall secodnary to instability and patient inability to follow commands or self correct.  Patient cued for hand placement, safety and positioning.    Stairs            Wheelchair Mobility    Modified Rankin (Stroke Patients Only)       Balance   Sitting-balance support: No upper extremity supported Sitting balance-Leahy Scale: Fair     Standing balance support: Bilateral upper extremity supported Standing balance-Leahy Scale: Poor (required moderate assist for static standing)                      Cognition Arousal/Alertness: Awake/alert Behavior During Therapy: WFL for tasks assessed/performed Overall Cognitive Status: Impaired/Different from baseline Area of Impairment: Attention;Safety/judgement;Awareness;Problem solving   Current Attention Level: Focused Memory: Decreased short-term memory   Safety/Judgement: Decreased awareness of safety;Decreased awareness of deficits Awareness: Intellectual Problem Solving: Slow processing;Difficulty sequencing;Requires verbal cues;Requires tactile cues General Comments: Patient with confusion during session, states that this is how he walks at home and believes that he is just fine to walk around on his own despite requiring maximal assist    Exercises General Exercises - Lower Extremity Ankle Circles/Pumps: AROM;Both;10 reps Long Arc Quad: AROM;Both;10 reps    General Comments        Pertinent Vitals/Pain Pain Assessment: Faces Faces Pain Scale: Hurts little more Pain Location: back Pain Descriptors / Indicators: Grimacing;Moaning Pain Intervention(s): Limited activity within patient's tolerance;Monitored during session;Repositioned     Home Living  Prior Function            Eugene Goals (current goals can now be found in the care plan section) Acute Rehab Eugene Goals Patient Stated Goal: home Eugene Goal Formulation: With patient Time For Goal Achievement: 12/12/14 Potential to Achieve Goals: Fair Progress towards Eugene goals: Progressing toward goals    Frequency  Min 3X/week    Eugene Plan Current plan remains appropriate    Co-evaluation             End of Session Equipment Utilized During Treatment: Gait belt;Oxygen Activity Tolerance: Patient limited by fatigue Patient left: in chair;with call bell/phone within reach;with chair alarm set     Time: [id number] Eugene Time Calculation (min) (ACUTE ONLY): 25 min  Charges:  $Gait Training: 8-22 mins $Therapeutic Activity: 8-22 mins                    G CodesFabio [name]:      Eugene [name], 10:21 AM Eugene [name], Eugene [name]  [phone]

## 2014-11-30 DIAGNOSIS — N390 Urinary tract infection, site not specified: Secondary | ICD-10-CM | POA: Diagnosis present

## 2014-11-30 LAB — HEPATIC FUNCTION PANEL
ALBUMIN: 2.8 g/dL — AB (ref 3.5–5.2)
ALT: 85 U/L — AB (ref 0–53)
AST: 39 U/L — AB (ref 0–37)
Alkaline Phosphatase: 238 U/L — ABNORMAL HIGH (ref 39–117)
Bilirubin, Direct: 0.6 mg/dL — ABNORMAL HIGH (ref 0.0–0.5)
Indirect Bilirubin: 0.9 mg/dL (ref 0.3–0.9)
TOTAL PROTEIN: 5.5 g/dL — AB (ref 6.0–8.3)
Total Bilirubin: 1.5 mg/dL — ABNORMAL HIGH (ref 0.3–1.2)

## 2014-11-30 LAB — BASIC METABOLIC PANEL
Anion gap: 7 (ref 5–15)
BUN: 22 mg/dL (ref 6–23)
CALCIUM: 8.9 mg/dL (ref 8.4–10.5)
CO2: 29 mmol/L (ref 19–32)
CREATININE: 1.16 mg/dL (ref 0.50–1.35)
Chloride: 101 mmol/L (ref 96–112)
GFR calc non Af Amer: 54 mL/min — ABNORMAL LOW (ref >90)
GFR, EST AFRICAN AMERICAN: 63 mL/min — AB (ref >90)
Glucose, Bld: 109 mg/dL — ABNORMAL HIGH (ref 70–99)
Potassium: 3.6 mmol/L (ref 3.5–5.1)
Sodium: 137 mmol/L (ref 135–145)

## 2014-11-30 MED ORDER — LEVOFLOXACIN 750 MG PO TABS
750.0000 mg | ORAL_TABLET | ORAL | Status: DC
  Administered 2014-12-01: 750 mg via ORAL
  Filled 2014-11-30: qty 1

## 2014-11-30 MED ORDER — FUROSEMIDE 10 MG/ML IJ SOLN
40.0000 mg | Freq: Once | INTRAMUSCULAR | Status: AC
  Administered 2014-11-30: 40 mg via INTRAVENOUS
  Filled 2014-11-30: qty 4

## 2014-11-30 NOTE — Progress Notes (Signed)
Patient Name: Eugene [name] Date of Encounter: 11/30/2014   Principal Problem:   Atrial flutter with rapid ventricular response  Active Problems:   S/P TAVR (transcatheter aortic valve replacement)   UTI (urinary tract infection)   Essential hypertension, benign   CAD (coronary artery disease)   Obesity   Acute on chronic combined systolic and diastolic congestive heart failure   Unstable angina pectoris   Hyperlipidemia   Chronic anticoagulation    SUBJECTIVE  No CP, SOB, or N/V.  Feels a little bit stronger then yesterday but doesn't feel like he is strong enough to go home yet. Has a roommate that lives with him around the clock but he is concerned because he said he is "a little man" and doesn't know if he could help him if he got was to weak to get up from a chair or his bed.  Has been in flutter/coarse fib since yesterday.  CURRENT MEDS . amiodarone  200 mg Oral BID  . antiseptic oral rinse  7 mL Mouth Rinse BID  . apixaban  2.5 mg Oral BID  . aspirin EC  81 mg Oral Daily  . ferrous sulfate  325 mg Oral Q breakfast  . furosemide  40 mg Oral Daily  . latanoprost  1 drop Right Eye QHS  . levofloxacin  750 mg Oral Q48H  . levothyroxine  25 mcg Oral QAC breakfast  . oxybutynin  5 mg Oral BID  . pantoprazole  40 mg Oral Daily  . polyethylene glycol  17 g Oral Daily  . pravastatin  40 mg Oral Daily  . tamsulosin  0.4 mg Oral Daily    OBJECTIVE  Filed Vitals:   11/29/14 1430 11/29/14 1835 11/29/14 2221 11/30/14 0447  BP: 120/70 114/73 139/77 128/80  Pulse: 77 87 79 80  Temp: 97.8 F (36.6 C) 97.5 F (36.4 C) 97.9 F (36.6 C) 97.2 F (36.2 C)  TempSrc: Oral Oral Axillary Oral  Resp: 18 18 18 18   Height:      Weight:    190 lb 7.6 oz (86.4 kg)  SpO2: 93% 94% 93% 94%    Intake/Output Summary (Last 24 hours) at 11/30/14 0747 Last data filed at 11/30/14 0430  Gross per 24 hour  Intake   1320 ml  Output   1900 ml  Net   -580 ml   Filed Weights   11/28/14  1506 11/29/14 0548 11/30/14 0447  Weight: 200 lb 13.4 oz (91.1 kg) 198 lb 2.8 oz (89.891 kg) 190 lb 7.6 oz (86.4 kg)    PHYSICAL EXAM  General: Pleasant, NAD. Neuro: Alert and oriented X 3. Moves all extremities spontaneously. Psych: Normal affect. HEENT:  Normal  Neck: Supple without bruits.  Difficult to assess jvp 2/2 girth. Lungs:  Resp regular and unlabored, bibasilar crackles with diminished breath sounds in right base. Heart: RRR no s3, s4, or murmurs. Abdomen: Soft, non-tender, non-distended, BS + x 4.  Extremities: No clubbing, cyanosis or edema. DP/PT/Radials 2+ and equal bilaterally.  Accessory Clinical Findings  CBC  Recent Labs  11/28/14 0320  WBC 7.3  HGB 13.2  HCT 39.6  MCV 93.8  PLT 100*   Basic Metabolic Panel  Recent Labs  11/29/14 0455 11/30/14 0435  NA 136 137  K 3.6 3.6  CL 102 101  CO2 27 29  GLUCOSE 92 109*  BUN 20 22  CREATININE 1.27 1.16  CALCIUM 8.8 8.9   Liver Function Tests  Recent Labs  11/30/14 0435  AST 39*  ALT 85*  ALKPHOS 238*  BILITOT 1.5*  PROT 5.5*  ALBUMIN 2.8*   TELE  Aflutter/coarse afib over past 24 hrs - rate controlled in 70's to 80's.  Radiology/Studies  Dg Chest Port 1 View  11/26/2014   CLINICAL DATA:  Respiratory distress  EXAM: PORTABLE CHEST - 1 VIEW  COMPARISON:  11/25/2014  FINDINGS: There is mild vascular fullness. No airspace opacities are evident. No large effusions are evident. There is moderate cardiomegaly and aortic tortuosity, unchanged.  IMPRESSION: Mild vascular fullness, perhaps a degree of congestive failure. No airspace opacities or large effusions.   Electronically Signed   By: Ellery Plunk M.D.   On: 11/26/2014 02:14   ASSESSMENT AND PLAN  1. A. Flutter/coarse afib with RVR:  Has been steadily in afib/flutter over past 24 hrs.  Asymptomatic.  Rate controlled.  Suspect that he has similar paroxysms @ home. Continue po amiodarone.  He is not on a bb 2/2 prior h/o bradyarrhythmias.   He is now on eliquis.  He was prev on ASA only in the setting of prior h/o recurrent GIBs.  F/U Hgb.  Now that he is on eliquis, will d/c ASA.  Ambulate today to assess HR's.   2. Acute on chronic combined systolic and diastolic congestive heart failure: Last Echo on 10/19/14 with an EF of 20-25%. Weight this a.m. 194 lbs, down from 198 yesterday.  Exam somewhat difficult due to obesity and neck girth.  He does have bibasilar crackles with diminished breath sounds in the right base.  Will give IV lasix this AM.  Consider adding low-dose acei.  Not currently on bb 2/2 prior reported h/o bradycardia.  3. Unstable angina pectoris/CAD: Troponin with a flat curve. No CP. Continue aspirin and pravastatin.  No further ischemic eval planned.  4. UTI/bacteremia: Blood cultures grew out K pneumoniae and Kluyvera ascorbata.  Urine culture grew out E. Sakazakii - all sensitive to fluoroquinolones.  He is on levaquin 750 mg q 48h currently.  He has been afebrile.  Follow-up wbc's.   5.  S/P TAVR:  Doing well from this standpoint.  6.  Dispo:  Will keep today and gently diurese.  Cont abx.  Ambulate.  Possible d/c tomorrow.  PT has rec SNF but pt and friend prefer d/c to home when appropriate with HHPT.  Possibly tomorrow.  Signed, Nicolasa Ducking NP  I have personally seen and examined this patient with Ward Givens, NP. I agree with the assessment and plan as outlined above. Continue Eliquis for atrial fib. D/c ASA. Rate control flutter on present meds. Weight stable. Volume status slightly up. IV Lasix today. Treated for urosepsis with Levaquin. Looks better. Still weak.  Ambulate today. Work with PT. Foley out. Hopefully home next 24-48 hours.   MCALHANY,CHRISTOPHER 11/30/2014 12:06 PM

## 2014-11-30 NOTE — Consult Note (Signed)
Referral received and spoke with the inpatient RNCM.  Left a message to caregiver and spoke briefly to explained [facility] services in general and left an information packet at the bedside for review of the caregiver, Casimiro NeedleMichael, and family. Of note, [facility] does not replace or interfere with any services arranged by the inpatient care management team.  For questions, please call Charlesetta ShanksVictoria Toshio Slusher, RN, BSN, CCM, [facility] Hospital Liaison at [phone].

## 2014-11-30 NOTE — Progress Notes (Signed)
Patient has increased agitation. PA notified and also made aware about morning strip and patient be in A. Fib. New orders given. Will continue to monitor patient for further changes in condition.

## 2014-12-01 DIAGNOSIS — R7881 Bacteremia: Secondary | ICD-10-CM | POA: Diagnosis present

## 2014-12-01 DIAGNOSIS — I359 Nonrheumatic aortic valve disorder, unspecified: Secondary | ICD-10-CM

## 2014-12-01 LAB — CBC
HCT: 44.8 % (ref 39.0–52.0)
Hemoglobin: 14.7 g/dL (ref 13.0–17.0)
MCH: 30.6 pg (ref 26.0–34.0)
MCHC: 32.8 g/dL (ref 30.0–36.0)
MCV: 93.3 fL (ref 78.0–100.0)
Platelets: 161 10*3/uL (ref 150–400)
RBC: 4.8 MIL/uL (ref 4.22–5.81)
RDW: 14.9 % (ref 11.5–15.5)
WBC: 8.6 10*3/uL (ref 4.0–10.5)

## 2014-12-01 LAB — BASIC METABOLIC PANEL
Anion gap: 10 (ref 5–15)
BUN: 29 mg/dL — ABNORMAL HIGH (ref 6–23)
CO2: 24 mmol/L (ref 19–32)
Calcium: 8.9 mg/dL (ref 8.4–10.5)
Chloride: 103 mmol/L (ref 96–112)
Creatinine, Ser: 1.35 mg/dL (ref 0.50–1.35)
GFR calc Af Amer: 52 mL/min — ABNORMAL LOW (ref >90)
GFR calc non Af Amer: 45 mL/min — ABNORMAL LOW (ref >90)
Glucose, Bld: 85 mg/dL (ref 70–99)
Potassium: 3.6 mmol/L (ref 3.5–5.1)
Sodium: 137 mmol/L (ref 135–145)

## 2014-12-01 NOTE — Progress Notes (Signed)
Patient Name: Eugene [name] Date of Encounter: 12/01/2014    Principal Problem:   Atrial flutter with rapid ventricular response Active Problems:   Bacteremia   S/P TAVR (transcatheter aortic valve replacement)   UTI (urinary tract infection)   Essential hypertension, benign   CAD (coronary artery disease)   Obesity   Acute on chronic combined systolic and diastolic congestive heart failure   Unstable angina pectoris   Hyperlipidemia   Chronic anticoagulation   SUBJECTIVE  No CP, SOB, or N/V.  Feels stronger then he did yesterday and states he is ready to go home.   CURRENT MEDS . amiodarone  200 mg Oral BID  . antiseptic oral rinse  7 mL Mouth Rinse BID  . apixaban  2.5 mg Oral BID  . ferrous sulfate  325 mg Oral Q breakfast  . furosemide  40 mg Oral Daily  . latanoprost  1 drop Right Eye QHS  . levofloxacin  750 mg Oral Q48H  . levothyroxine  25 mcg Oral QAC breakfast  . oxybutynin  5 mg Oral BID  . pantoprazole  40 mg Oral Daily  . polyethylene glycol  17 g Oral Daily  . pravastatin  40 mg Oral Daily  . tamsulosin  0.4 mg Oral Daily    OBJECTIVE  Filed Vitals:   11/30/14 1300 11/30/14 1454 11/30/14 2049 12/01/14 0500  BP: 148/95 122/90 132/92   Pulse: 72 87 82   Temp:  98.5 F (36.9 C) 97.7 F (36.5 C)   TempSrc:  Oral Oral   Resp:  18 20   Height:      Weight:    192 lb (87.091 kg)  SpO2:  94% 94%     Intake/Output Summary (Last 24 hours) at 12/01/14 0842 Last data filed at 12/01/14 0245  Gross per 24 hour  Intake    720 ml  Output   1100 ml  Net   -380 ml   Filed Weights   11/30/14 0447 11/30/14 1118 12/01/14 0500  Weight: 190 lb 7.6 oz (86.4 kg) 194 lb 3.6 oz (88.1 kg) 192 lb (87.091 kg)    PHYSICAL EXAM  General: Pleasant, NAD. Neuro: Alert and oriented X 3. Moves all extremities spontaneously. Psych: Normal affect. HEENT:  Normal  Neck: Supple without bruits. Difficult to assess jvp secondary to girth. Lungs:  Resp regular and  unlabored, diminished breaths sounds in right base. CTA. Heart: RRR no s3, s4, or murmurs. Abdomen: Soft, non-tender, non-distended, BS + x 4.  Extremities: No clubbing, cyanosis or edema. DP/PT/Radials 2+ and equal bilaterally.  Accessory Clinical Findings  CBC  Recent Labs  12/01/14 0440  WBC 8.6  HGB 14.7  HCT 44.8  MCV 93.3  PLT 161   Basic Metabolic Panel  Recent Labs  11/30/14 0435 12/01/14 0440  NA 137 137  K 3.6 3.6  CL 101 103  CO2 29 24  GLUCOSE 109* 85  BUN 22 29*  CREATININE 1.16 1.35  CALCIUM 8.9 8.9   Liver Function Tests  Recent Labs  11/30/14 0435  AST 39*  ALT 85*  ALKPHOS 238*  BILITOT 1.5*  PROT 5.5*  ALBUMIN 2.8*   TELE    ASSESSMENT AND PLAN  1. A. Flutter/coarse afib with RVR:   2. Acute on chronic combined systolic and diastolic congestive heart failure: Last echo on 10/19/14 with an EF of 20-25%. Weight this a.m. 192 lbs, down from 194 yesterday. Output responded well to one time dose of IV lasix,  although his BUN/Creat increased to 29/1.35 respectively. Breath sounds CTA this a.m. Still diminished in right base. No SOB. Not currently on bb secondary to reported prior history of bradycardia. Hold off on acei/arb with slight elevation of bun/creat this am.  Can look to add as outpt.  3. Unstable angina pectoris/CAD: Troponin with a flat trend - suspect demand ischemia in setting of chf. No CP. Continue pravastatin. Aspirin stopped yesterday since he is on anticoagulation. No further ischemic eval planned.  4. UTI/bacteremia: Blood cultures grew out K pneumoniae (2/2) and Kluyvera ascorbata (1/2). Urine culture grew out E. Sakazakii - all sensitive to fluoroquinolones. Remains afebrile. WBC count today 8.6.  He remains on levaquin.  Given culture findings, bacteremia does not appear to be seeded from urine.  With recent TAVR, we may need to consider TEE.  Will d/w Dr. Clifton JamesMcAlhany and have request ID eval for further recs.  5. S/P TAVR:  Continues to do well from this standpoint.  Signed, Nicolasa Duckinghristopher Berge NP  I have personally seen and examined this patient with Ward Givenshris Berge, NP. I agree with the assessment and plan as outlined above. He is almost ready for d/c. The one outlying issue is his bacteremia. The organisms in his urine and blood are not the same. Currently on Levaquin. The question is the need for alteration of therapy or if there is a need for TEE to exclude vegetations on his new bioprosthetic aortic valve. ? Could this be a source of his bacteremia or can we attribute it to his UTI. Otherwise, volume status is ok and atrial fib is rate controlled.   MCALHANY,CHRISTOPHER 12/01/2014 12:30 PM

## 2014-12-01 NOTE — Consult Note (Signed)
[facility]    Date of Admission:  11/25/2014   Total days of antibiotics 6               Reason for Consult: Gram-negative rod bacteremia    Referring Physician: Dr. Earney Hamburghris McAlhany  Principal Problem:   Gram-negative bacteremia Active Problems:   S/P TAVR (transcatheter aortic valve replacement)   Atrial flutter with rapid ventricular response   Hyperlipidemia   Essential hypertension, benign   CAD (coronary artery disease)   Obesity   Acute on chronic combined systolic and diastolic congestive heart failure   Unstable angina pectoris   Chronic anticoagulation   UTI (urinary tract infection)   . amiodarone  200 mg Oral BID  . antiseptic oral rinse  7 mL Mouth Rinse BID  . apixaban  2.5 mg Oral BID  . ferrous sulfate  325 mg Oral Q breakfast  . furosemide  40 mg Oral Daily  . latanoprost  1 drop Right Eye QHS  . levofloxacin  750 mg Oral Q48H  . levothyroxine  25 mcg Oral QAC breakfast  . oxybutynin  5 mg Oral BID  . pantoprazole  40 mg Oral Daily  . polyethylene glycol  17 g Oral Daily  . pravastatin  40 mg Oral Daily  . tamsulosin  0.4 mg Oral Daily    Recommendations: 1. Continue oral levofloxacin   Assessment: Eugene [name] has polymicrobial gram-negative rod bacteremia, probably from an intra-abdominal source. He is improving on levofloxacin therapy. There is no evidence of prosthetic aortic valve endocarditis clinically or by transthoracic echocardiogram. Fortunately enteric gram-negative rods are relatively unlikely to cause endocarditis given their lack of a sticky outer capsule like Staphylococcus or Streptococcus. I do not feel strongly that he needs a TEE. Although it might be ideal to obtain a CT scan of his abdomen looking to see if there is any focus of infection that would alter his management, I am not sure the yield warrants any risk of IV contrast given his pre-existing renal insufficiency and the fact that he no  longer has any abdominal symptoms. I recommend giving him 3 weeks of total quinolone antibiotic therapy.    HPI: Eugene [name] is a 81 y.o. male history of aortic stenosis who underwent transcatheter aortic valve replacement last November. He did well postoperatively but had sudden onset of severe nausea, vomiting and shortness of breath 6 days ago. He was brought to the hospital by EMS and was noted to have atrial tachycardia initially. He recalls having right lower quadrant pain at that time and some left thigh pain. He was febrile on admission to 101.7. Admission blood cultures have grown Klebsiella pneumoniae in both sets and Kluyvera ascorbata (formerly Enterobacter) and one of 2 sets. Urine culture grew Enterobacter. He denies any recent urinary symptoms. He was started on a quinolone antibiotic and defervesced promptly. He denies any further nausea, vomiting or abdominal pain. He states that he's had some constipation recently.   Review of Systems: Review of systems not obtained due to patient factors.  Past Medical History  Diagnosis Date  . Glaucoma     Right eye  . Diverticulosis     multiple hospital admissions for GI bleed, has an episode roughly every 6 months,, last  colonscopy in 2006 showed diverticulosis, was evaluated by Dr. Madilyn FiremanHayes in the hospital in 2011 for lower GI bleed andd it  was suggested that the patient was not  actively bleeding at that time and given his comorbid conditions colonoscopy was deferred. He is also chronically constipated  . Macular degeneration of right eye   . Allergic rhinitis   . CAD (coronary artery disease)     a. h/o remote inferior MI, h/o PCI to RCA in 1991. b. syncope 2013: severe AS and high grade prox LAD and other diffuse disease. High risk for CABG - s/p BMS to prox LAD and balloon aortic valvuloplasty. c. cath 07/16/14 - diffuse CAD, managed medically.  . Hypertension   . Hyperlipidemia   . Chronic combined systolic and diastolic CHF  (congestive heart failure)     a. Most recent echo 08/2014: EF 30-35%.  . Supraventricular tachycardia     2 syndromes-nonsustained atrial tachycardia//adenosine responsive diuretic positive reentry probably AV node reentry  . Aortic stenosis     a. s/p AV balloon valvuloplasty by Dr. Excell Seltzer 11/2011. b. s/p TAVR 08/2014.  . Ischemic cardiomyopathy   . H/O: GI bleed     recurrent  . Hypothyroidism   . GERD (gastroesophageal reflux disease)   . Falls frequently     3 times in the past week/notes 09/11/2013  . Myocardial infarction 1991; 10/28/2011  . Arthritis     "might have slight; have trouble w/my fingers" (09/11/2013)  . PAF (paroxysmal atrial fibrillation)   . Peripheral vascular disease     a. h/o ulcer on the left great toe s/p angioplasty of the left anterior tibial artery.  . OSA on CPAP     since 89  . Limited mobility   . 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 PAT (paroxysmal atrial tachycardia)   . Chronic anticoagulation     apixaban    History  Substance Use Topics  . Smoking status: Former Smoker -- 3.00 packs/day for 21 years    Types: Cigarettes    Quit date: 10/08/1958  . Smokeless tobacco: Never Used  . Alcohol Use: No    Family History  Problem Relation Age of Onset  . Cancer Mother     Ovarian Cancer  . Cancer Father     Lung  . Heart disease Sister     Heart disease before age 21   Allergies  Allergen Reactions  . Penicillins Swelling and Other (See Comments)    REACTION: "turns red"  . Corn-Containing Products Other (See Comments)    Trouble digesting    OBJECTIVE: Blood pressure 98/38, pulse 90, temperature 97.3 F (36.3 C), temperature source Oral, resp. rate 18, height  (1.702 m), weight 192 lb (87.091 kg), SpO2 94 %. General: He was sleeping soundly when I entered the room but did arouse. He appears  confused. Skin: Scattered ecchymoses. No splinter or conjunctival hemorrhages Lungs: Clear Cor: Regular S1 and S2 with no murmurs heard Abdomen: Soft and nontender with no palpable masses Joints and extremities: Chronic changes of degenerative arthritis  Lab Results Lab Results  Component Value Date   WBC 8.6 12/01/2014   HGB 14.7 12/01/2014   HCT 44.8 12/01/2014   MCV 93.3 12/01/2014   PLT 161 12/01/2014    Lab Results  Component Value Date   CREATININE 1.35 12/01/2014   BUN 29* 12/01/2014   NA 137 12/01/2014   K 3.6 12/01/2014   CL 103 12/01/2014   CO2 24 12/01/2014    Lab Results  Component Value Date   ALT  85* 11/30/2014   AST 39* 11/30/2014   ALKPHOS 238* 11/30/2014   BILITOT 1.5* 11/30/2014     Microbiology: Recent Results (from the past 240 hour(s))  MRSA PCR Screening     Status: None   Collection Time: 11/25/14  9:12 PM  Result Value Ref Range Status   MRSA by PCR NEGATIVE NEGATIVE Final    Comment:        The GeneXpert MRSA Assay (FDA approved for NASAL specimens only), is one component of a comprehensive MRSA colonization surveillance program. It is not intended to diagnose MRSA infection nor to guide or monitor treatment for MRSA infections.   Culture, Urine     Status: None   Collection Time: 11/26/14  4:08 AM  Result Value Ref Range Status   Specimen Description URINE, CATHETERIZED  Final   Special Requests NONE  Final   Colony Count   Final    >=100,000 COLONIES/ML Performed at [facility]    Culture   Final    ENTEROBACTER SAKAZAKII Performed at [facility]    Report Status 11/28/2014 FINAL  Final   Organism ID, Bacteria ENTEROBACTER SAKAZAKII  Final      Susceptibility   Enterobacter sakazakii - MIC*    CEFAZOLIN >=64 RESISTANT Resistant     CEFTRIAXONE <=1 SENSITIVE Sensitive     CIPROFLOXACIN <=0.25 SENSITIVE Sensitive     GENTAMICIN <=1 SENSITIVE Sensitive     LEVOFLOXACIN <=0.12 SENSITIVE Sensitive      NITROFURANTOIN 64 INTERMEDIATE Intermediate     TOBRAMYCIN <=1 SENSITIVE Sensitive     TRIMETH/SULFA <=20 SENSITIVE Sensitive     PIP/TAZO 8 SENSITIVE Sensitive     * ENTEROBACTER SAKAZAKII  Culture, blood (routine x 2)     Status: None   Collection Time: 11/26/14  4:42 AM  Result Value Ref Range Status   Specimen Description BLOOD RIGHT ARM  Final   Special Requests   Final    BOTTLES DRAWN AEROBIC AND ANAEROBIC 6CC BLUE 3CC RED   Culture   Final    KLEBSIELLA PNEUMONIAE KLUYVERA ASCORBATA Note: Gram Stain Report Called to,Read Back By and Verified With: Boyd Kerbs RN [id number] Performed at [facility]    Report Status 11/29/2014 FINAL  Final   Organism ID, Bacteria KLEBSIELLA PNEUMONIAE  Final   Organism ID, Bacteria KLUYVERA ASCORBATA  Final      Susceptibility   Klebsiella pneumoniae - MIC*    AMPICILLIN RESISTANT      AMPICILLIN/SULBACTAM <=2 SENSITIVE Sensitive     CEFAZOLIN <=4 SENSITIVE Sensitive     CEFEPIME <=1 SENSITIVE Sensitive     CEFTAZIDIME <=1 SENSITIVE Sensitive     CEFTRIAXONE <=1 SENSITIVE Sensitive     CIPROFLOXACIN <=0.25 SENSITIVE Sensitive     GENTAMICIN <=1 SENSITIVE Sensitive     IMIPENEM <=0.25 SENSITIVE Sensitive     PIP/TAZO <=4 SENSITIVE Sensitive     TOBRAMYCIN <=1 SENSITIVE Sensitive     TRIMETH/SULFA <=20 SENSITIVE Sensitive     * KLEBSIELLA PNEUMONIAE   Kluyvera ascorbata - MIC*    AMPICILLIN RESISTANT      AMPICILLIN/SULBACTAM <=2 SENSITIVE Sensitive     CEFAZOLIN RESISTANT      CEFEPIME <=1 SENSITIVE Sensitive     CEFTAZIDIME <=1 SENSITIVE Sensitive     CEFTRIAXONE <=1 SENSITIVE Sensitive     CIPROFLOXACIN <=0.25 SENSITIVE Sensitive     GENTAMICIN <=1 SENSITIVE Sensitive     IMIPENEM <=0.25 SENSITIVE Sensitive     PIP/TAZO <=4 SENSITIVE  Sensitive     TOBRAMYCIN <=1 SENSITIVE Sensitive     TRIMETH/SULFA <=20 SENSITIVE Sensitive     * KLUYVERA ASCORBATA  Culture, blood (routine x 2)     Status: None   Collection Time:  11/26/14  4:42 AM  Result Value Ref Range Status   Specimen Description BLOOD RIGHT HAND  Final   Special Requests BOTTLES DRAWN AEROBIC ONLY 3CC  Final   Culture   Final    KLEBSIELLA PNEUMONIAE Note: SUSCEPTIBILITIES PERFORMED ON PREVIOUS CULTURE WITHIN THE LAST 5 DAYS. KLUYVERA ASCORBATA Note: Gram Stain Report Called to,Read Back By and Verified With: Boyd Kerbs RN [id number] Performed at [facility]    Report Status 11/29/2014 FINAL  Final    Cliffton Asters, MD [facility] [phone] pager   [phone] cell 12/01/2014, 2:50 PM

## 2014-12-01 NOTE — Progress Notes (Signed)
ANTIBIOTIC CONSULT NOTE - FOLLOW UP  Pharmacy Consult for Levaquin Indication: Sepsis/UTI  Allergies  Allergen Reactions  . Penicillins Swelling and Other (See Comments)    REACTION: "turns red"  . Corn-Containing Products Other (See Comments)    Trouble digesting    Patient Measurements: Height: 5\' 7"  (170.2 cm) Weight: 192 lb (87.091 kg) IBW/kg (Calculated) : 66.1 Adjusted Body Weight: * Vital Signs: Temp: 97.3 F (36.3 C) (02/24 0900) Temp Source: Oral (02/24 0900) BP: 98/38 mmHg (02/24 0900) Pulse Rate: 90 (02/24 0900) Intake/Output from previous day: 02/23 0701 - 02/24 0700 In: 960 [P.O.:960] Out: 1100 [Urine:1100] Intake/Output from this shift: Total I/O In: 560 [P.O.:560] Out: 50 [Urine:50]  Labs:  Recent Labs  11/29/14 0455 11/30/14 0435 12/01/14 0440  WBC  --   --  8.6  HGB  --   --  14.7  PLT  --   --  161  CREATININE 1.27 1.16 1.35   Estimated Creatinine Clearance: 39.9 mL/min (by C-G formula based on Cr of 1.35). No results for input(s): VANCOTROUGH, VANCOPEAK, VANCORANDOM, GENTTROUGH, GENTPEAK, GENTRANDOM, TOBRATROUGH, TOBRAPEAK, TOBRARND, AMIKACINPEAK, AMIKACINTROU, AMIKACIN in the last 72 hours.   Microbiology:   Anti-infectives    Start     Dose/Rate Route Frequency Ordered Stop   12/01/14 1200  levofloxacin (LEVAQUIN) tablet 750 mg     750 mg Oral Every 48 hours 11/30/14 1143     11/29/14 1000  levofloxacin (LEVAQUIN) tablet 750 mg  Status:  Discontinued     750 mg Oral Every 48 hours 11/28/14 1123 11/30/14 1143   11/26/14 0400  ciprofloxacin (CIPRO) IVPB 400 mg  Status:  Discontinued    Comments:  Please obtain blood and urine cultures prior to abx   400 mg 200 mL/hr over 60 Minutes Intravenous Every 12 hours 11/26/14 0356 11/28/14 1107      Assessment: CC: emesis  PMH: diverticulosis, CAD, HTN, HLD, CHF, AS, GIB, hypothyroid, GERD, afib, PVD, OSA  AC: Heparin for new aflutter (initially for r/o ACS but mildly elevated troponins  likely demand ischemia, not true NSTEMI), now switched to apixaban 2.5 mg BID. CBC WNL. - -2/23Apixaban 2.5 mg bid (has h/o 2 GI bleeds)- consider increase to 5mg  po bid. - d/w R. Barrett. Due to h/o GIB and likelyhood of unstable renal func will keep at 2.5mg  po bid  ID: Levaquin for sepsis/UTI, wbc wnl, afebrile. Scr 1.35 relatively stable. Plan ID consult and consider TEE.   2/19 UCx: enterobacter Sakazacii(sens to lq) 2/19 BCx: kleb pn (resis to amp only), kluyvera ascorbata (S Cipro, resis to amp only)  Cipro 2/19 >> 2/21 Levo 2/21 >>  CV: HTN, CAD, CHF, USAP, HLD, EF 20-25% (was 30-35% 08/2014) - admit w/ aflutter RVR -s/p TAVR Meds: amiodarone,  pravastatin; no plans for repeat cath. BP low 98/38, HR 90  Endo: synthroid - TSH wnl  GI/Nutr: LFTs + Tbili elevated on 2/18 (had previously been normal) and improved 2/23. Anticipate further trend down.  Renal: sCr 1.35, CrCl ~ 40  Heme/Onc: CBC stable. Plts improved  Other: losartan  Best practices: eliquis   Plan:  Levaquin 750mg  po q48 hr dose ok.   Eugene [name], PharmD, BCPS Clinical Staff Pharmacist Pager [phone]  Eugene [name], Eugene [name] 12/01/2014,1:57 PM

## 2014-12-01 NOTE — Progress Notes (Signed)
Physical Therapy Treatment Patient Details Name: Eugene [name] MRN: [id number] DOB: 1924-06-30 Today's Date: 12/01/2014    History of Present Illness Pt admitted with unstable angina pectoris, CAD, status postTAVR (08-10-14), and atrial flutter w 3:1 AVB.    PT Comments    Patient with some continued confusion and agitation this session. Patient continues to require increased assist for all aspects of mobility. Caregiver education for mobility performed via hands on and teachback methods. Caregiver educated on assistance needed as well as safety precautions and fall risk. Spoke at length regarding concerns for mobility and recommendation of use of W/C until patient deemed safe for ambulation by home therapy.    Follow Up Recommendations  Home health PT;Supervision/Assistance - 24 hour (Rec SNF, but caregiver 24/7 request HHPT agreeable to asst)     Equipment Recommendations  None recommended by PT    Recommendations for Other Services       Precautions / Restrictions Precautions Precautions: Fall Restrictions Weight Bearing Restrictions: No    Mobility  Bed Mobility Overal bed mobility: Needs Assistance Bed Mobility: Rolling;Sit to Supine Rolling: Min assist     Sit to supine: Mod assist   General bed mobility comments: Patient assisted to elevate LEs to bed, and assist for repositioning  Transfers Overall transfer level: Needs assistance Equipment used: Rolling walker (2 wheeled) Transfers: Sit to/from [employer] Sit to Stand: Mod assist Stand pivot transfers: Mod assist       General transfer comment: Patient continues to demonstrate need for increased physical assist, unsteady and impulsive with transfers, no control over descent, Performed x4 with caregiver assist and VCs for PT  Ambulation/Gait Ambulation/Gait assistance: Max assist Ambulation Distance (Feet): 40 Feet (addtl 16 ft x2 with caregiver) Assistive device: Rolling walker (2  wheeled) Gait Pattern/deviations: Scissoring;Staggering left;Staggering right;Trunk flexed Gait velocity: decreased Gait velocity interpretation: <1.8 ft/sec, indicative of risk for recurrent falls General Gait Details: Patient very unsteady with gait, maximal assist due to to instability and patient inability to follow commands or self correct.  Patient cued for hand placement, safety and positioning. Caregiver cues for safety and positioning with use of gait belt. Caregiver able to perform ambulation x2 with continued cues and assist.   Stairs            Wheelchair Mobility    Modified Rankin (Stroke Patients Only)       Balance   Sitting-balance support: No upper extremity supported Sitting balance-Leahy Scale: Fair     Standing balance support: Bilateral upper extremity supported Standing balance-Leahy Scale: Poor (required moderate assist for static standing)                      Cognition Arousal/Alertness: Awake/alert Behavior During Therapy: WFL for tasks assessed/performed;Anxious;Impulsive Overall Cognitive Status: Impaired/Different from baseline Area of Impairment: Attention;Safety/judgement;Awareness;Problem solving   Current Attention Level: Focused Memory: Decreased short-term memory   Safety/Judgement: Decreased awareness of safety;Decreased awareness of deficits Awareness: Intellectual Problem Solving: Slow processing;Difficulty sequencing;Requires verbal cues;Requires tactile cues General Comments: Patient continues to demonstrates deficits in safety awareness and recognition of deficits    Exercises      General Comments General comments (skin integrity, edema, etc.): Caregiver education for mobility performed via hands on and teachback methods. Caregiver educated on assistance needed as well as safety precautions and fall risk. Spoke at length regarding concerns for mobility and recommendation of use of W/C until patient deemed safe for  ambulation by home therapy.  Pertinent Vitals/Pain Pain Assessment: Faces Faces Pain Scale: Hurts little more Pain Intervention(s): Limited activity within patient's tolerance;Monitored during session;Relaxation    Home Living                      Prior Function            PT Goals (current goals can now be found in the care plan section) Acute Rehab PT Goals Patient Stated Goal: home PT Goal Formulation: With patient Time For Goal Achievement: 12/12/14 Potential to Achieve Goals: Fair Progress towards PT goals: Progressing toward goals    Frequency  Min 3X/week    PT Plan Current plan remains appropriate    Co-evaluation             End of Session Equipment Utilized During Treatment: Gait belt;Oxygen Activity Tolerance: Patient limited by fatigue Patient left: in chair;with call bell/phone within reach;with chair alarm set     Time: [id number] PT Time Calculation (min) (ACUTE ONLY): 40 min  Charges:  $Gait Training: 8-22 mins $Therapeutic Activity: 8-22 mins $Self Care/Home Management: 8-22                    G CodesFabio Asa:      Korene Dula J 12/01/2014, 3:32 PM Charlotte Crumbevon Maecyn Panning, PT DPT  [phone]

## 2014-12-02 ENCOUNTER — Telehealth: Payer: Self-pay | Admitting: Cardiovascular Disease

## 2014-12-02 ENCOUNTER — Encounter (HOSPITAL_COMMUNITY): Payer: Self-pay | Admitting: Physician Assistant

## 2014-12-02 DIAGNOSIS — Z8719 Personal history of other diseases of the digestive system: Secondary | ICD-10-CM

## 2014-12-02 DIAGNOSIS — E039 Hypothyroidism, unspecified: Secondary | ICD-10-CM | POA: Diagnosis present

## 2014-12-02 DIAGNOSIS — I255 Ischemic cardiomyopathy: Secondary | ICD-10-CM | POA: Diagnosis present

## 2014-12-02 DIAGNOSIS — A4150 Gram-negative sepsis, unspecified: Secondary | ICD-10-CM

## 2014-12-02 DIAGNOSIS — I10 Essential (primary) hypertension: Secondary | ICD-10-CM | POA: Diagnosis present

## 2014-12-02 LAB — BASIC METABOLIC PANEL
Anion gap: 14 (ref 5–15)
BUN: 31 mg/dL — ABNORMAL HIGH (ref 6–23)
CALCIUM: 9.1 mg/dL (ref 8.4–10.5)
CO2: 24 mmol/L (ref 19–32)
Chloride: 100 mmol/L (ref 96–112)
Creatinine, Ser: 1.35 mg/dL (ref 0.50–1.35)
GFR, EST AFRICAN AMERICAN: 52 mL/min — AB (ref >90)
GFR, EST NON AFRICAN AMERICAN: 45 mL/min — AB (ref >90)
GLUCOSE: 91 mg/dL (ref 70–99)
POTASSIUM: 3.4 mmol/L — AB (ref 3.5–5.1)
Sodium: 138 mmol/L (ref 135–145)

## 2014-12-02 MED ORDER — FUROSEMIDE 40 MG PO TABS: 40.0000 mg | ORAL_TABLET | Freq: Every day | ORAL | Status: DC

## 2014-12-02 MED ORDER — APIXABAN 2.5 MG PO TABS: 2.5000 mg | ORAL_TABLET | Freq: Two times a day (BID) | ORAL | Status: AC

## 2014-12-02 MED ORDER — TAMSULOSIN HCL 0.4 MG PO CAPS: 0.4000 mg | ORAL_CAPSULE | Freq: Every day | ORAL | Status: AC

## 2014-12-02 MED ORDER — NITROGLYCERIN 0.4 MG SL SUBL: 0.4000 mg | SUBLINGUAL_TABLET | SUBLINGUAL | Status: AC | PRN

## 2014-12-02 MED ORDER — ONDANSETRON HCL 4 MG/2ML IJ SOLN
4.0000 mg | Freq: Four times a day (QID) | INTRAMUSCULAR | Status: DC
  Administered 2014-12-02: 4 mg via INTRAVENOUS

## 2014-12-02 MED ORDER — LEVOFLOXACIN 750 MG PO TABS: 750.0000 mg | ORAL_TABLET | ORAL | Status: DC

## 2014-12-02 MED ORDER — POTASSIUM CHLORIDE ER 10 MEQ PO TBCR: 20.0000 meq | EXTENDED_RELEASE_TABLET | Freq: Every day | ORAL | Status: DC

## 2014-12-02 MED ORDER — AMIODARONE HCL 200 MG PO TABS: 200.0000 mg | ORAL_TABLET | Freq: Two times a day (BID) | ORAL | Status: AC

## 2014-12-02 NOTE — Telephone Encounter (Signed)
Will forward to Dr. Cooper. 

## 2014-12-02 NOTE — Progress Notes (Signed)
Patient ID: Eugene [name], male   DOB: 10-12-1923, 81 y.o.   MRN: [id number]         [facility]    Date of Admission:  11/25/2014   Total days of antibiotics 7          Principal Problem:   Gram-negative bacteremia Active Problems:   S/P TAVR (transcatheter aortic valve replacement)   Atrial flutter with rapid ventricular response   Hyperlipidemia   Essential hypertension, benign   CAD (coronary artery disease)   Obesity   Acute on chronic combined systolic and diastolic congestive heart failure   Unstable angina pectoris   Chronic anticoagulation   UTI (urinary tract infection)   . amiodarone  200 mg Oral BID  . antiseptic oral rinse  7 mL Mouth Rinse BID  . apixaban  2.5 mg Oral BID  . ferrous sulfate  325 mg Oral Q breakfast  . furosemide  40 mg Oral Daily  . latanoprost  1 drop Right Eye QHS  . levofloxacin  750 mg Oral Q48H  . levothyroxine  25 mcg Oral QAC breakfast  . ondansetron (ZOFRAN) IV  4 mg Intravenous 4 times per day  . oxybutynin  5 mg Oral BID  . pantoprazole  40 mg Oral Daily  . polyethylene glycol  17 g Oral Daily  . pravastatin  40 mg Oral Daily  . tamsulosin  0.4 mg Oral Daily    Subjective: He is feeling much better. He had an episode this morning where he coughed up several tablespoons of brownish phlegm. He felt much better after that. He and his caregiver state that this is not at all unusual for him. That usually happens each morning. He is not having any abdominal pain, nausea or vomiting.  Review of Systems: Pertinent items are noted in HPI.  Past Medical History  Diagnosis Date  . Glaucoma     Right eye  . Diverticulosis     multiple hospital admissions for GI bleed, has an episode roughly every 6 months,, last  colonscopy in 2006 showed diverticulosis, was evaluated by Dr. Madilyn Fireman in the hospital in 2011 for lower GI bleed andd it  was suggested that the patient was not actively bleeding at that time and given  his comorbid conditions colonoscopy was deferred. He is also chronically constipated  . Macular degeneration of right eye   . Allergic rhinitis   . CAD (coronary artery disease)     a. h/o remote inferior MI, h/o PCI to RCA in 1991. b. syncope 2013: severe AS and high grade prox LAD and other diffuse disease. High risk for CABG - s/p BMS to prox LAD and balloon aortic valvuloplasty. c. cath 07/16/14 - diffuse CAD, managed medically.  . Hypertension   . Hyperlipidemia   . Chronic combined systolic and diastolic CHF (congestive heart failure)     a. Most recent echo 08/2014: EF 30-35%.  . Supraventricular tachycardia     2 syndromes-nonsustained atrial tachycardia//adenosine responsive diuretic positive reentry probably AV node reentry  . Aortic stenosis     a. s/p AV balloon valvuloplasty by Dr. Excell Seltzer 11/2011. b. s/p TAVR 08/2014.  . Ischemic cardiomyopathy   . H/O: GI bleed     recurrent  . Hypothyroidism   . GERD (gastroesophageal reflux disease)   . Falls frequently     3 times in the past week/notes 09/11/2013  . Myocardial infarction 1991; 10/28/2011  . Arthritis     "might have slight;  have trouble w/my fingers" (09/11/2013)  . PAF (paroxysmal atrial fibrillation)   . Peripheral vascular disease     a. h/o ulcer on the left great toe s/p angioplasty of the left anterior tibial artery.  . OSA on CPAP     since 89  . Limited mobility   . 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 PAT (paroxysmal atrial tachycardia)   . Chronic anticoagulation     apixaban    History  Substance Use Topics  . Smoking status: Former Smoker -- 3.00 packs/day for 21 years    Types: Cigarettes    Quit date: 10/08/1958  . Smokeless tobacco: Never Used  . Alcohol Use: No    Family History  Problem Relation Age of Onset  . Cancer Mother     Ovarian Cancer  . Cancer Father      Lung  . Heart disease Sister     Heart disease before age 79   Allergies  Allergen Reactions  . Penicillins Swelling and Other (See Comments)    REACTION: "turns red"  . Corn-Containing Products Other (See Comments)    Trouble digesting    OBJECTIVE: Blood pressure 128/79, pulse 77, temperature 97.8 F (36.6 C), temperature source Oral, resp. rate 18, height 5\' 7"  (1.702 m), weight 192 lb 4.8 oz (87.227 kg), SpO2 94 %. General: More alert and talkative. He is in good spirits. Skin: No acute rash Lungs: Clear Cor: Regular S1 and S2 with no murmur Abdomen: Soft and nontender with no palpable masses  Lab Results Lab Results  Component Value Date   WBC 8.6 12/01/2014   HGB 14.7 12/01/2014   HCT 44.8 12/01/2014   MCV 93.3 12/01/2014   PLT 161 12/01/2014    Lab Results  Component Value Date   CREATININE 1.35 12/02/2014   BUN 31* 12/02/2014   NA 138 12/02/2014   K 3.4* 12/02/2014   CL 100 12/02/2014   CO2 24 12/02/2014    Lab Results  Component Value Date   ALT 85* 11/30/2014   AST 39* 11/30/2014   ALKPHOS 238* 11/30/2014   BILITOT 1.5* 11/30/2014     Microbiology: Recent Results (from the past 240 hour(s))  MRSA PCR Screening     Status: None   Collection Time: 11/25/14  9:12 PM  Result Value Ref Range Status   MRSA by PCR NEGATIVE NEGATIVE Final    Comment:        The GeneXpert MRSA Assay (FDA approved for NASAL specimens only), is one component of a comprehensive MRSA colonization surveillance program. It is not intended to diagnose MRSA infection nor to guide or monitor treatment for MRSA infections.   Culture, Urine     Status: None   Collection Time: 11/26/14  4:08 AM  Result Value Ref Range Status   Specimen Description URINE, CATHETERIZED  Final   Special Requests NONE  Final   Colony Count   Final    >=100,000 COLONIES/ML Performed at [facility]    Culture   Final    ENTEROBACTER SAKAZAKII Performed at [facility]     Report Status 11/28/2014 FINAL  Final   Organism ID, Bacteria ENTEROBACTER SAKAZAKII  Final      Susceptibility   Enterobacter sakazakii - MIC*    CEFAZOLIN >=64 RESISTANT Resistant     CEFTRIAXONE <=1 SENSITIVE Sensitive     CIPROFLOXACIN <=0.25  SENSITIVE Sensitive     GENTAMICIN <=1 SENSITIVE Sensitive     LEVOFLOXACIN <=0.12 SENSITIVE Sensitive     NITROFURANTOIN 64 INTERMEDIATE Intermediate     TOBRAMYCIN <=1 SENSITIVE Sensitive     TRIMETH/SULFA <=20 SENSITIVE Sensitive     PIP/TAZO 8 SENSITIVE Sensitive     * ENTEROBACTER SAKAZAKII  Culture, blood (routine x 2)     Status: None   Collection Time: 11/26/14  4:42 AM  Result Value Ref Range Status   Specimen Description BLOOD RIGHT ARM  Final   Special Requests   Final    BOTTLES DRAWN AEROBIC AND ANAEROBIC 6CC BLUE 3CC RED   Culture   Final    KLEBSIELLA PNEUMONIAE KLUYVERA ASCORBATA Note: Gram Stain Report Called to,Read Back By and Verified With: Boyd Kerbs RN [phone] Performed at [facility]    Report Status 11/29/2014 FINAL  Final   Organism ID, Bacteria KLEBSIELLA PNEUMONIAE  Final   Organism ID, Bacteria KLUYVERA ASCORBATA  Final      Susceptibility   Klebsiella pneumoniae - MIC*    AMPICILLIN RESISTANT      AMPICILLIN/SULBACTAM <=2 SENSITIVE Sensitive     CEFAZOLIN <=4 SENSITIVE Sensitive     CEFEPIME <=1 SENSITIVE Sensitive     CEFTAZIDIME <=1 SENSITIVE Sensitive     CEFTRIAXONE <=1 SENSITIVE Sensitive     CIPROFLOXACIN <=0.25 SENSITIVE Sensitive     GENTAMICIN <=1 SENSITIVE Sensitive     IMIPENEM <=0.25 SENSITIVE Sensitive     PIP/TAZO <=4 SENSITIVE Sensitive     TOBRAMYCIN <=1 SENSITIVE Sensitive     TRIMETH/SULFA <=20 SENSITIVE Sensitive     * KLEBSIELLA PNEUMONIAE   Kluyvera ascorbata - MIC*    AMPICILLIN RESISTANT      AMPICILLIN/SULBACTAM <=2 SENSITIVE Sensitive     CEFAZOLIN RESISTANT      CEFEPIME <=1 SENSITIVE Sensitive     CEFTAZIDIME <=1 SENSITIVE Sensitive     CEFTRIAXONE <=1  SENSITIVE Sensitive     CIPROFLOXACIN <=0.25 SENSITIVE Sensitive     GENTAMICIN <=1 SENSITIVE Sensitive     IMIPENEM <=0.25 SENSITIVE Sensitive     PIP/TAZO <=4 SENSITIVE Sensitive     TOBRAMYCIN <=1 SENSITIVE Sensitive     TRIMETH/SULFA <=20 SENSITIVE Sensitive     * KLUYVERA ASCORBATA  Culture, blood (routine x 2)     Status: None   Collection Time: 11/26/14  4:42 AM  Result Value Ref Range Status   Specimen Description BLOOD RIGHT HAND  Final   Special Requests BOTTLES DRAWN AEROBIC ONLY 3CC  Final   Culture   Final    KLEBSIELLA PNEUMONIAE Note: SUSCEPTIBILITIES PERFORMED ON PREVIOUS CULTURE WITHIN THE LAST 5 DAYS. KLUYVERA ASCORBATA Note: Gram Stain Report Called to,Read Back By and Verified With: Boyd Kerbs RN [phone] Performed at [facility]    Report Status 11/29/2014 FINAL  Final    Assessment: He is improving on therapy for gram-negative rod bacteremia. I recommend 2 more weeks of renally dosed levofloxacin.  Plan: 1. Continue levofloxacin every other day for 2 more weeks 2. I will sign off now  Cliffton Asters, MD [facility] [phone] pager   [phone] cell 12/02/2014, 11:11 AM

## 2014-12-02 NOTE — Discharge Summary (Signed)
Discharge Summary   Patient ID: Eugene Dunkerfird N Shatswell MRN: [id number], DOB/AGE: February 02, 1924 81 y.o. Admit date: 11/25/2014 D/C date:     12/02/2014  Primary Cardiologist: Dr. [name]  Principal Problem:   Atrial flutter with rapid ventricular response Active Problems:   Acute on chronic combined systolic and diastolic congestive heart failure   Gram-negative bacteremia   Hyperlipidemia   SVT (supraventricular tachycardia)   CAD (coronary artery disease)   Obstructive sleep apnea   Obesity   PVD (peripheral vascular disease)   S/P TAVR (transcatheter aortic valve replacement)   Chronic anticoagulation   UTI (urinary tract infection)   Hypothyroidism   H/O: GI bleed   Ischemic cardiomyopathy   Hypertension    Admission Dates: 11/25/14-12/02/14 Discharge Diagnosis: Atrial flutter with RVR and a/c combined S/D CHF. Discharge weight 192 lbs  HPI: Eugene [name] is a 81 y.o. male with a history of AS s/p TAVR (08/2014), CAD s/p remote PCI to RCA (1991); BMS to pLAD and balloon aortic valvuloplasty (2013), ischemic CM ( EF 20-25%), chronic mixed systolic/diastolic CHF, PAF on amiodarone, HTN, HLD, OSA, PAD s/p angioplasty of the L anterior tibial artery, history of GI bleeding and hypothryroidism who [facility]14 with severe nausea/vomiting, chest pain and SOB. He was found to be in rapid atrial flutter w 3:1 AVB.   Hospital Course  A. Flutter/coarse afib with RVR: He was placed on IV amiodarone and he spontaneously converted into NSR. He was previously not anticoagulated due to hx of GI bleeds. It was discussed with Dr. [name] who felt renally dosed Eliquis was appropriate. -- Continue Eliquis 2.5mg  BID and amiodarone 200mg  BID. Amiodarone can tapered as an outpatient.   Acute on chronic combined systolic and diastolic CHF:  -- ECHO from 10/19/14 shows EF of 20-25%, moderate LVH, diffuse hypokinesis, and akinesis of basal/midinferolateral myocardium -- Weight stable at 192  lbs, down from 194 on 11/30/14. He responded well to one time dose of IV lasix, although his BUN/Creat increased to 31/1.35 respectively. He was then converted to PO lasix 40mg  qd.  -- Not currently on BB secondary to reported prior history of bradycardia. Hold off on acei/arb with slight elevation of bun/creat this am.Can look to add as outpt. (previously on losartan).   NSTEMI/CAD: Troponin with a flat trend - suspect demand ischemia in setting of CHF. No CP.  -- He has known severe RCA disease  -- Continue pravastatin.Aspirin stopped since he is on anticoagulation. No further ischemic eval planned.  UTI- Urine culture grew out E. Sakazakii. He was placed on Cipro which was later switched to levofloxacin in the setting of G- rod bacteremia.  Gram-negative rod bacteremia: Blood cultures grew out K pneumoniae and Kluyvera ascorbata. -- Seen by ID yesterday. He has polymicrobial G -rod bacteremia, probably from an intra-abdominal source. He is improving on levofloxacin therapy. There is no evidence of prosthetic aortic valve endocarditis clinically or by transthoracic echocardiogram. Fortunately enteric gram-negative rods are relatively unlikely to cause endocarditis given their lack of a sticky outer capsule like Staphylococcus or Streptococcus. He did not feel strongly that he needs a TEE. Although it might be ideal to obtain a CT scan of his abdomen looking to see if there is any focus of infection that would alter his management, it was felt that the yield of this did not warrant the risk of IV contrast given his pre-existing renal insufficiency and the fact that he no longer has any abdominal symptoms. Dr Orvan Falconerampbell  recommend giving him 3 weeks of total quinolone antibiotic therapy.  -- Continue 750 mg levofloxacin every other day (renally dosed) for 2 more weeks  S/P TAVR (08/2014): Continues to do well from this standpoint.  Hypothyroidism: TSH 1.248 on admission.  -- Continue  Synthroid  History of GI bleeding- his chart documents episodes roughly every 6 months while on DAPT felt to be secondary to diverticulosis. Last episode of GI bleeding was in 2011 -- It was felt safe to start him on renally dosed Eliquis. No ASA   CKD- creat 1.4 on admission. Today 1.35  The patient has had an uncomplicated hospital course and is recovering well.  She has been seen by Dr. Sanjuana Kava today and deemed ready for discharge home. All follow-up appointments have been scheduled.  Discharge medications are listed below.   Discharge Vitals: Blood pressure 128/79, pulse 77, temperature 97.8 F (36.6 C), temperature source Oral, resp. rate 18, height  (1.702 m), weight 192 lb 4.8 oz (87.227 kg), SpO2 94 %.  Labs: Lab Results  Component Value Date   WBC 8.6 12/01/2014   HGB 14.7 12/01/2014   HCT 44.8 12/01/2014   MCV 93.3 12/01/2014   PLT 161 12/01/2014    Recent Labs Lab 11/30/14 0435  12/02/14 0357  NA 137  < > 138  K 3.6  < > 3.4*  CL 101  < > 100  CO2 29  < > 24  BUN 22  < > 31*  CREATININE 1.16  < > 1.35  CALCIUM 8.9  < > 9.1  PROT 5.5*  --   --   BILITOT 1.5*  --   --   ALKPHOS 238*  --   --   ALT 85*  --   --   AST 39*  --   --   GLUCOSE 109*  < > 91  < > = values in this interval not displayed.     Diagnostic Studies/Procedures   Dg Chest Port 1 View  11/26/2014   CLINICAL DATA:  Respiratory distress  EXAM: PORTABLE CHEST - 1 VIEW  COMPARISON:  11/25/2014  FINDINGS: There is mild vascular fullness. No airspace opacities are evident. No large effusions are evident. There is moderate cardiomegaly and aortic tortuosity, unchanged.  IMPRESSION: Mild vascular fullness, perhaps a degree of congestive failure. No airspace opacities or large effusions.   Electronically Signed   By: Ellery Plunk M.D.   On: 11/26/2014 02:14   Dg Chest Portable 1 View  11/25/2014   CLINICAL DATA:  Chest pain, could STEMI  EXAM: PORTABLE CHEST - 1 VIEW  COMPARISON:   08/12/2014  FINDINGS: Lungs are essentially clear. No focal consolidation. No pleural effusion or pneumothorax.  Cardiomegaly.  Status post aortic valve replacement.  IMPRESSION: No evidence of acute cardiopulmonary disease.   Electronically Signed   By: Charline Bills M.D.   On: 11/25/2014 19:15    Discharge Medications     Medication List    STOP taking these medications        losartan 50 MG tablet  Commonly known as:  COZAAR     spironolactone 25 MG tablet  Commonly known as:  ALDACTONE      TAKE these medications        acetaminophen 500 MG tablet  Commonly known as:  TYLENOL  Take 1-2 tablets (500-1,000 mg total) by mouth every 6 (six) hours as needed for mild pain or moderate pain.     amiodarone 200 MG tablet  Commonly known as:  PACERONE  Take 1 tablet (200 mg total) by mouth 2 (two) times daily.     apixaban 2.5 MG Tabs tablet  Commonly known as:  ELIQUIS  Take 1 tablet (2.5 mg total) by mouth 2 (two) times daily.     aspirin EC 81 MG tablet  Take 81 mg by mouth daily.     CALCIUM 600+D 600-800 MG-UNIT Tabs  Generic drug:  Calcium Carb-Cholecalciferol  Take 1 tablet by mouth daily.     DSS 100 MG Caps  Take 100 mg by mouth 2 (two) times daily as needed for mild constipation.     ferrous sulfate 325 (65 FE) MG tablet  Take 325 mg by mouth daily with breakfast.     furosemide 40 MG tablet  Commonly known as:  LASIX  Take 1 tablet (40 mg total) by mouth daily.     levofloxacin 750 MG tablet  Commonly known as:  LEVAQUIN  Take 1 tablet (750 mg total) by mouth every other day.     levothyroxine 25 MCG tablet  Commonly known as:  SYNTHROID, LEVOTHROID  TAKE 1 TABLET BY MOUTH EVERY DAY     multivitamin tablet  Take 1 tablet by mouth at bedtime.     nitroGLYCERIN 0.4 MG SL tablet  Commonly known as:  NITROSTAT  Place 1 tablet (0.4 mg total) under the tongue every 5 (five) minutes x 3 doses as needed for chest pain.     oxybutynin 5 MG tablet   Commonly known as:  DITROPAN  Take 5 mg by mouth 2 (two) times daily.     pantoprazole 40 MG tablet  Commonly known as:  PROTONIX  Take 40 mg by mouth daily.     potassium chloride 10 MEQ tablet  Commonly known as:  K-DUR  Take 2 tablets (20 mEq total) by mouth daily.     pravastatin 40 MG tablet  Commonly known as:  PRAVACHOL  Take 1 tablet (40 mg total) by mouth daily.     PRESERVISION AREDS PO  Take 1 capsule by mouth 2 (two) times daily.     tamsulosin 0.4 MG Caps capsule  Commonly known as:  FLOMAX  Take 1 capsule (0.4 mg total) by mouth daily.     terazosin 1 MG capsule  Commonly known as:  HYTRIN  TAKE ONE CAPSULE BY MOUTH AT BEDTIME     traMADol 50 MG tablet  Commonly known as:  ULTRAM  Take 0.5 tablets (25 mg total) by mouth every 6 (six) hours as needed.     Travoprost (BAK Free) 0.004 % Soln ophthalmic solution  Commonly known as:  TRAVATAN  Place 1 drop into the right eye at bedtime.     vitamin B-12 1000 MCG tablet  Commonly known as:  CYANOCOBALAMIN  Take 1,000 mcg by mouth daily.        Disposition   The patient will be discharged in stable condition to home.  Follow-up Information    Follow up with Eugene Newcomer, PA-C On 12/15/2014.   Specialty:  Physician Assistant   Why:  @ 12:10pm    Contact information:   [address] [place] [address] [phone]         Duration of Discharge Encounter: Greater than 30 minutes including physician and PA time.  Eugene [name], Eugene Fullenwider R PA-C 12/02/2014, 3:30 PM

## 2014-12-02 NOTE — Telephone Encounter (Signed)
New messge      Drug interaction levaquin 750mg  and amiodarone.  Please call

## 2014-12-02 NOTE — Telephone Encounter (Signed)
Left message to call back  

## 2014-12-02 NOTE — Telephone Encounter (Signed)
New problem   Want to know if Dr [name] will sign Home Health orders. Please advise.

## 2014-12-02 NOTE — Telephone Encounter (Signed)
New Msg         Pt has TCM appt on 12/15/14 at 12:10 pm with Tereso NewcomerScott Weaver per Bary CastillaKaty Thompson.

## 2014-12-02 NOTE — Progress Notes (Signed)
Physical Therapy Treatment Patient Details Name: Eugene [name] MRN: [id number] DOB: 1924/01/05 Today's Date: 12/02/2014    History of Present Illness Pt admitted with unstable angina pectoris, CAD, status postTAVR (08-10-14), and atrial flutter w 3:1 AVB.    PT Comments    Patient performed 3 trials of gait training with physical assist. Patient tolerated some self care tasks and performed transfer training from various surface heights. Patient remains confused but is very pleasant this session. Will continue to see and progress as tolerated.  Follow Up Recommendations  Home health PT;Supervision/Assistance - 24 hour (Rec SNF, but caregiver 24/7 request HHPT agreeable to asst)     Equipment Recommendations  None recommended by PT    Recommendations for Other Services       Precautions / Restrictions Precautions Precautions: Fall Restrictions Weight Bearing Restrictions: No    Mobility  Bed Mobility Overal bed mobility: Needs Assistance             General bed mobility comments: Pt received in recliner.  Transfers Overall transfer level: Needs assistance Equipment used: Rolling walker (2 wheeled) Transfers: Sit to/from [employer] Sit to Stand: Mod assist Stand pivot transfers: Mod assist       General transfer comment: Patient initially remains significantly unsteady with poor ability to follow cues, 2 attempts to pivot to [facility], patient with improvements in transfers from elevated surface, performed 1 from bed surface, 2 from [facility] and 2 from chair.  Ambulation/Gait Ambulation/Gait assistance: Max assist Ambulation Distance (Feet): 30 Feet (3 trials of 10 ft with seated rest break in between) Assistive device: Rolling walker (2 wheeled) Gait Pattern/deviations: Scissoring;Staggering left;Staggering right;Trunk flexed Gait velocity: decreased Gait velocity interpretation: <1.8 ft/sec, indicative of risk for recurrent falls General Gait Details:  Patient remains very unsteady with gait, requiring increased assist to prevent fall. Patient with significant flexed posture and poor ability to stand erect despite verbal and tactile cues.  Patient cued for hand placement, safety and positioning.    Stairs            Wheelchair Mobility    Modified Rankin (Stroke Patients Only)       Balance     Sitting balance-Leahy Scale: Fair       Standing balance-Leahy Scale: Poor (required moderate assist for static standing)                      Cognition Arousal/Alertness: Awake/alert Behavior During Therapy: WFL for tasks assessed/performed Overall Cognitive Status: Impaired/Different from baseline Area of Impairment: Attention;Safety/judgement;Awareness;Problem solving   Current Attention Level: Focused Memory: Decreased short-term memory   Safety/Judgement: Decreased awareness of safety;Decreased awareness of deficits Awareness: Intellectual Problem Solving: Slow processing;Difficulty sequencing;Requires verbal cues;Requires tactile cues General Comments: Patient pleasent this session, talks about his relationship and appreciation for his caregiver michael.    Exercises General Exercises - Lower Extremity Ankle Circles/Pumps: AROM;Both;10 reps    General Comments General comments (skin integrity, edema, etc.): patient did use bedside commode during session, required assist for stability while attempting self pericare.       Pertinent Vitals/Pain Faces Pain Scale: Hurts little more    Home Living                      Prior Function            PT Goals (current goals can now be found in the care plan section) Acute Rehab PT Goals Patient Stated Goal: home PT Goal  Formulation: With patient Time For Goal Achievement: 12/12/14 Potential to Achieve Goals: Fair Progress towards PT goals: Progressing toward goals    Frequency  Min 3X/week    PT Plan Current plan remains appropriate     Co-evaluation             End of Session Equipment Utilized During Treatment: Gait belt;Oxygen Activity Tolerance: Patient limited by fatigue Patient left: in chair;with call bell/phone within reach;with chair alarm set     Time: 1135-1158 PT Time Calculation (min) (ACUTE ONLY): 23 min  Charges:  $Gait Training: 8-22 mins $Therapeutic Activity: 8-22 mins                    G CodesFabio Asa:      Aldrich Lloyd J 12/02/2014, 2:09 PM Charlotte Crumbevon Georgi Tuel, PT DPT  [phone]

## 2014-12-02 NOTE — Progress Notes (Signed)
Patient Name: Eugene [name] Date of Encounter: 12/01/2014    Principal Problem:   Atrial flutter with rapid ventricular response Active Problems:   Bacteremia   S/P TAVR (transcatheter aortic valve replacement)   UTI (urinary tract infection)   Essential hypertension, benign   CAD (coronary artery disease)   Obesity   Acute on chronic combined systolic and diastolic congestive heart failure   Unstable angina pectoris   Hyperlipidemia   Chronic anticoagulation   SUBJECTIVE  Was planning on going home today but this AM he felt dizzy, nauseated and had a coughing fit with some post tussive emesis. I went back in to see the patient about 10 minutes later to check on him and he was doing much better in terms of coughing and nausea. He did not feel like he was ready to go home today after all.  No abdominal pain, SOB or CP.   CURRENT MEDS . amiodarone  200 mg Oral BID  . antiseptic oral rinse  7 mL Mouth Rinse BID  . apixaban  2.5 mg Oral BID  . ferrous sulfate  325 mg Oral Q breakfast  . furosemide  40 mg Oral Daily  . latanoprost  1 drop Right Eye QHS  . levofloxacin  750 mg Oral Q48H  . levothyroxine  25 mcg Oral QAC breakfast  . oxybutynin  5 mg Oral BID  . pantoprazole  40 mg Oral Daily  . polyethylene glycol  17 g Oral Daily  . pravastatin  40 mg Oral Daily  . tamsulosin  0.4 mg Oral Daily    OBJECTIVE  Filed Vitals:   11/30/14 1300 11/30/14 1454 11/30/14 2049 12/01/14 0500  BP: 148/95 122/90 132/92   Pulse: 72 87 82   Temp:  98.5 F (36.9 C) 97.7 F (36.5 C)   TempSrc:  Oral Oral   Resp:  18 20   Height:      Weight:    192 lb (87.091 kg)  SpO2:  94% 94%     Intake/Output Summary (Last 24 hours) at 12/01/14 0842 Last data filed at 12/01/14 0245  Gross per 24 hour  Intake    720 ml  Output   1100 ml  Net   -380 ml   Filed Weights   11/30/14 0447 11/30/14 1118 12/01/14 0500  Weight: 190 lb 7.6 oz (86.4 kg) 194 lb 3.6 oz (88.1 kg) 192 lb (87.091 kg)     PHYSICAL EXAM  General: Pleasant, NAD. Neuro: Alert and oriented X 3. Moves all extremities spontaneously. Psych: Normal affect. HEENT:  Normal  Neck: Supple without bruits. Difficult to assess jvp secondary to girth. Lungs:  Resp regular and unlabored, diminished breaths sounds in right base. CTA. Heart: RRR no s3, s4, or murmurs. Abdomen: Soft, non-tender, non-distended, BS + x 4.  Extremities: No clubbing, cyanosis or edema. DP/PT/Radials 2+ and equal bilaterally.  Accessory Clinical Findings  CBC  Recent Labs  12/01/14 0440  WBC 8.6  HGB 14.7  HCT 44.8  MCV 93.3  PLT 161   Basic Metabolic Panel  Recent Labs  11/30/14 0435 12/01/14 0440  NA 137 137  K 3.6 3.6  CL 101 103  CO2 29 24  GLUCOSE 109* 85  BUN 22 29*  CREATININE 1.16 1.35  CALCIUM 8.9 8.9   Liver Function Tests  Recent Labs  11/30/14 0435  AST 39*  ALT 85*  ALKPHOS 238*  BILITOT 1.5*  PROT 5.5*  ALBUMIN 2.8*   TELE  afib  with rate control. Freq PVCs  ASSESSMENT AND PLAN  1. A. Flutter/coarse afib with RVR: rate now controlled  -- Continue Eliquis and amiodarone  BID.   2. Acute on chronic combined systolic and diastolic congestive heart failure: Last echo on 10/19/14 with an EF of 20-25%. Weight stable at 192 lbs, down from 194 on 11/30/14. Output responded well to one time dose of IV lasix, although his BUN/Creat increased to 31/1.35 respectively. No SOB.  Continue Po lasix  qd.  -- Not currently on bb secondary to reported prior history of bradycardia. Hold off on acei/arb with slight elevation of bun/creat this am.  Can look to add as outpt.  3. Unstable angina pectoris/CAD: Troponin with a flat trend - suspect demand ischemia in setting of chf. No CP. Continue pravastatin. Aspirin stopped yesterday since he is on anticoagulation. No further ischemic eval planned.  4. Gram-negative rod bacteremia: seen by ID yesterday. He has polymicrobial G - rod bacteremia, probably  from an intra-abdominal source. He is improving on levofloxacin therapy. There is no evidence of prosthetic aortic valve endocarditis clinically or by transthoracic echocardiogram. Fortunately enteric gram-negative rods are relatively unlikely to cause endocarditis given their lack of a sticky outer capsule like Staphylococcus or Streptococcus. He did not feel strongly that he needs a TEE. Although it might be ideal to obtain a CT scan of his abdomen looking to see if there is any focus of infection that would alter his management, it was felt that the yield of this did not warrant the risk of IV contrast given his pre-existing renal insufficiency and the fact that he no longer has any abdominal symptoms. Dr Orvan Falconer recommend giving him 3 weeks of total quinolone antibiotic therapy.   -- Had some nausea after a coughing fit this AM, now resolved. No fevers or abdominal pain. Plan for discharge tomorrow on oral ABx therapy.  5. S/P TAVR: Continues to do well from this standpoint.     Janetta Hora 12/02/2014 8:47 AM  I have personally seen and examined this patient with Cline Crock, PA-C.  I agree with the assessment and plan as outlined above. He is doing much better. Appreciate ID assistance. Home with Levaquin renally doses for GNR bacteremia. No need for TEE if infection clears. Volume status is ok. HR controlled. Discharge home today and follow up Dr. Excell Seltzer or office APP in 1-2 weeks.   Mahdiya Mossberg 12/02/2014 12:48 PM

## 2014-12-02 NOTE — Telephone Encounter (Signed)
Talked to [facility]. Per Cline CrockKathryn Thompson PA and Audrie LiaSally Earl Pharmacist patient can take Levaquin and Amiodarone, previous QTc is 511 from 2/19.  Patient will need to come in tomorrow for an EKG to see his QTc interval. Called and left message on patients home and mobile phone to call back to make an appointment.

## 2014-12-03 NOTE — Telephone Encounter (Signed)
Yes I will be happy to sign

## 2014-12-03 NOTE — Telephone Encounter (Signed)
Patient contacted regarding discharge from  [facility] on 12/03/2014  Patient understands to follow up with provider Tereso NewcomerScott Weaver PA on 12/15/2014 at 12:10 at [address], [place], [place]. Patient understands discharge instructions? Yes Patient understands medications and regiment? Yes I have a caregiver. Patient understands to bring all medications to this visit? Yes  Patient is unable to come to the office today for an EKG, so scheduled him a nurses visit on Monday for EKG. Patient verbalized understanding.

## 2014-12-03 NOTE — Telephone Encounter (Signed)
Called [facility] back. Informed them that Dr. [name] will sign for home health orders. They will send the information to our office.

## 2014-12-03 NOTE — Telephone Encounter (Signed)
Left message to call back  

## 2014-12-04 NOTE — Telephone Encounter (Signed)
Needs ecg as he is on amiodarone and levaquin. thankyou for setting up pam

## 2014-12-06 NOTE — Telephone Encounter (Signed)
Patient did not make his appointment today. Called patient and talked to his caregiver/driver. He stated that patient is not really mobile and he is a fall risk.  Caregiver stated maybe by the end of the week he can bring patient in. Caregiver inform the office that patient will be on Levaquin for three weeks. Will forward to Cline CrockKathryn Thompson PA for further instructions.

## 2014-12-07 ENCOUNTER — Encounter: Payer: Self-pay | Admitting: Physician Assistant

## 2014-12-07 NOTE — Telephone Encounter (Signed)
Tried to call patient to let him know he can come in at any time this week to have an ECG done. Was unable to leave a message.

## 2014-12-07 NOTE — Telephone Encounter (Signed)
I am in the office wed-Friday if he can come in at all for an ECG. He can keep his formal appt with scott weaver.

## 2014-12-07 NOTE — Telephone Encounter (Signed)
Spoke w/Mike (caregiver) states he could bring him in Thursday 3/3 for an EKG. Needs per Cathlean CowerKatie Thompson,PA due to being on Amiodarone and Levaquin.  Appointment made for nurse visit 3/3 at 2:00 pm. States that will be a good time for them.

## 2014-12-09 ENCOUNTER — Encounter: Payer: Self-pay | Admitting: *Deleted

## 2014-12-09 ENCOUNTER — Ambulatory Visit (INDEPENDENT_AMBULATORY_CARE_PROVIDER_SITE_OTHER): Payer: Medicare Other | Admitting: *Deleted

## 2014-12-09 VITALS — BP 122/70 | HR 84 | Wt 190.0 lb

## 2014-12-09 DIAGNOSIS — I4892 Unspecified atrial flutter: Secondary | ICD-10-CM

## 2014-12-09 NOTE — Patient Instructions (Signed)
Continue on same medication: Amiodarone and Levaquin  Keep Office visit appointment next week 12/15/14 at 12:10 with Lilian ComaScott Weaver,PA  Call office if develops any problems at [phone].

## 2014-12-09 NOTE — Progress Notes (Signed)
1.) Reason for visit: EKG s/p hospital due to being on Amiodarone and Levaquin  2.) Name of MD requesting visit: Dr. Melene Mullerhristopher McAlhaney  3.) H&P: S/P TAVR; atrial flutter w/RVS  4.) ROS related to problem: No c/o today.  States he feels good. Reviewed meds with care giver Kathlene November(Mike). Pt states he has been sleeping and eating             Good.  EKG shows Atrial fib w/ good rate control.        Reviewed with Dr. Sanjuana KavaMcAlhaney who advises no changes-stay on Amiodarone and Levaquin. Will send to him to make any further recommendations.

## 2014-12-15 ENCOUNTER — Encounter: Payer: Self-pay | Admitting: Physician Assistant

## 2014-12-15 ENCOUNTER — Telehealth: Payer: Self-pay | Admitting: *Deleted

## 2014-12-15 ENCOUNTER — Ambulatory Visit (INDEPENDENT_AMBULATORY_CARE_PROVIDER_SITE_OTHER): Payer: Medicare Other | Admitting: Physician Assistant

## 2014-12-15 VITALS — BP 122/77 | HR 73 | Ht 67.0 in | Wt 195.0 lb

## 2014-12-15 DIAGNOSIS — N390 Urinary tract infection, site not specified: Secondary | ICD-10-CM

## 2014-12-15 DIAGNOSIS — Z952 Presence of prosthetic heart valve: Secondary | ICD-10-CM

## 2014-12-15 DIAGNOSIS — I481 Persistent atrial fibrillation: Secondary | ICD-10-CM

## 2014-12-15 DIAGNOSIS — Z954 Presence of other heart-valve replacement: Secondary | ICD-10-CM

## 2014-12-15 DIAGNOSIS — I350 Nonrheumatic aortic (valve) stenosis: Secondary | ICD-10-CM

## 2014-12-15 DIAGNOSIS — I5042 Chronic combined systolic (congestive) and diastolic (congestive) heart failure: Secondary | ICD-10-CM | POA: Diagnosis not present

## 2014-12-15 DIAGNOSIS — I255 Ischemic cardiomyopathy: Secondary | ICD-10-CM

## 2014-12-15 DIAGNOSIS — I4819 Other persistent atrial fibrillation: Secondary | ICD-10-CM

## 2014-12-15 DIAGNOSIS — I70209 Unspecified atherosclerosis of native arteries of extremities, unspecified extremity: Secondary | ICD-10-CM

## 2014-12-15 DIAGNOSIS — L98499 Non-pressure chronic ulcer of skin of other sites with unspecified severity: Secondary | ICD-10-CM

## 2014-12-15 DIAGNOSIS — I2510 Atherosclerotic heart disease of native coronary artery without angina pectoris: Secondary | ICD-10-CM

## 2014-12-15 DIAGNOSIS — N189 Chronic kidney disease, unspecified: Secondary | ICD-10-CM | POA: Diagnosis not present

## 2014-12-15 DIAGNOSIS — Z8719 Personal history of other diseases of the digestive system: Secondary | ICD-10-CM

## 2014-12-15 DIAGNOSIS — I10 Essential (primary) hypertension: Secondary | ICD-10-CM

## 2014-12-15 DIAGNOSIS — I739 Peripheral vascular disease, unspecified: Secondary | ICD-10-CM

## 2014-12-15 DIAGNOSIS — E785 Hyperlipidemia, unspecified: Secondary | ICD-10-CM

## 2014-12-15 LAB — BASIC METABOLIC PANEL
BUN: 29 mg/dL — ABNORMAL HIGH (ref 6–23)
CALCIUM: 10.2 mg/dL (ref 8.4–10.5)
CO2: 31 mEq/L (ref 19–32)
CREATININE: 1.31 mg/dL (ref 0.40–1.50)
Chloride: 99 mEq/L (ref 96–112)
GFR: 54.82 mL/min — AB (ref >60.00)
GLUCOSE: 89 mg/dL (ref 70–99)
Potassium: 4.2 mEq/L (ref 3.5–5.1)
Sodium: 137 mEq/L (ref 135–145)

## 2014-12-15 LAB — CBC WITH DIFFERENTIAL/PLATELET
BASOS ABS: 0.1 10*3/uL (ref 0.0–0.1)
BASOS PCT: 0.7 % (ref 0.0–3.0)
Eosinophils Absolute: 0.1 10*3/uL (ref 0.0–0.7)
Eosinophils Relative: 1.6 % (ref 0.0–5.0)
HCT: 46.5 % (ref 39.0–52.0)
Hemoglobin: 15.7 g/dL (ref 13.0–17.0)
Lymphocytes Relative: 19.9 % (ref 12.0–46.0)
Lymphs Abs: 1.8 10*3/uL (ref 0.7–4.0)
MCHC: 33.8 g/dL (ref 30.0–36.0)
MCV: 93.6 fl (ref 78.0–100.0)
Monocytes Absolute: 0.8 10*3/uL (ref 0.1–1.0)
Monocytes Relative: 9.1 % (ref 3.0–12.0)
Neutro Abs: 6.3 10*3/uL (ref 1.4–7.7)
Neutrophils Relative %: 68.7 % (ref 43.0–77.0)
Platelets: 244 10*3/uL (ref 150.0–400.0)
RBC: 4.97 Mil/uL (ref 4.22–5.81)
RDW: 14.5 % (ref 11.5–15.5)
WBC: 9.1 10*3/uL (ref 4.0–10.5)

## 2014-12-15 NOTE — Patient Instructions (Signed)
LAB WORK TODAY BMET, CBC W/DIFF  STOP ASPIRIN AS OF TODAY  FOLLOW UP WITH DR. [name] 01/05/15 @ 2:45  CALL PODIATRY ASAP TO CHECK WOUND ON 2ND TOE ON LEFT FOOT

## 2014-12-15 NOTE — Progress Notes (Addendum)
Cardiology Office Note   Date:  12/15/2014   ID:  GERAN [name], DOB [birth year], MRN [id number]  PCP:  Earl Lagos, MD  Cardiologist:  Dr. Tonny Bollman     Chief Complaint  Patient presents with  . Congestive Heart Failure  . Atrial Fibrillation  . Hospitalization Follow-up     History of Present Illness: Eugene [name] is a 81 y.o. male with a hx of aortic stensos, CAD s/p inf MI in 1990s tx with POBA and PCI of LAD with BMS and balloon aortic valvuloplasty in 2013, DCM, systolic HF.  He had recurrent GI bleeding while on DAP therapy in the past. He has a hx of PAF and has been maintained on Amiodarone.  He ultimately underwent TAVR via transfemoral approach 08/2014.  He is treated with low dose ASA (no Plavix given hx of GI bleed).  He has PAD and is s/p PTA to L tibial artery in 2014 by Dr. Edilia Bo.  Last seen by Dr. Tonny Bollman in 10/2013.  DCM is felt to be multifactorial with ischemic heart disease, atrial arrhythmias, long standing aortic valve disease.  He is not on beta blocker due to hx of bradycardia.    Admitted 2/18-2/25 with acute on chronic combined systolic and diastolic CHF in the setting of atrial fibrillation/flutter with RVR and urosepsis. He was placed on IV amiodarone and spontaneously converted to NSR. Decision was ultimately made to place him on Eliquis 2.5 mg twice a day and amiodarone 200 mg twice a day.  He was diuresed. He had minimally elevated troponins. This was felt to be related to demand ischemia. Urine culture grew out   E. Sakazakii. Blood cultures grew out K pneumoniae and Kluyvera ascorbata. He was placed on Levaquin. He was seen by infectious disease. Recommendation was to continue Levaquin 3 weeks.  He returns for FU.    He is doing well.  He is working with HHPT daily.  He denies significant SOB.  Denies chest pain. Denies syncope or dizziness.  Denies orthopnea, PND or significant edema.  He denies fever, chills, dysuria.      Studies/Reports Reviewed Today:  Echo 10/19/14 - Moderate LVH. EF 20-25%. Diffuse hypokinesis. There is akinesis of the basal-midinferolateral myocardium. Doppler parameters are consistent with high ventricular filling pressure. - Aortic valve: A bioprosthesis was present. - Ascending aorta: The ascending aorta was mildly dilated. - Mitral valve: Calcified annulus. There was mild regurgitation. - Left atrium: The atrium was moderately dilated.  Impressions: - Technically difficult; severe LV dysfunction; elevated LV filling pressure; s/p TAVR with normal mean gradient of 9 mmHg; no AI; moderate LAE; mild MR.  Cardiac Cath 07/16/14 LM:  Calcified but widely patent. LAD:  Patent. Proximal LAD stent is widely patent without stenosis. Apical LAD supplies collats to R PLB LCx:  Mid 60% stenosis  RCA:  Severely diseased - 90% proximal, mid, and distal vessel. The PDA branch is patent. The PLA is occluded and fills from left to right collaterals.  >> Med Rx.   Past Medical History  Diagnosis Date  . Glaucoma     Right eye  . Diverticulosis     multiple hospital admissions for GI bleed, has an episode roughly every 6 months,, last  colonscopy in 2006 showed diverticulosis, was evaluated by Dr. Madilyn Fireman in the hospital in 2011 for lower GI bleed andd it  was suggested that the patient was not actively bleeding at that time and given his comorbid conditions colonoscopy was deferred.  He is also chronically constipated  . Macular degeneration of right eye   . Allergic rhinitis   . CAD (coronary artery disease)     a. h/o remote inferior MI, h/o PCI to RCA in 1991. b. syncope 2013: severe AS and high grade prox LAD and other diffuse disease. High risk for CABG - s/p BMS to prox LAD and balloon aortic valvuloplasty. c. cath 07/16/14 - diffuse CAD, managed medically.  . Hypertension   . Hyperlipidemia   . Chronic combined systolic and diastolic CHF (congestive heart failure)     a. Most recent  echo 08/2014: EF 30-35%.  . Supraventricular tachycardia     2 syndromes-nonsustained atrial tachycardia//adenosine responsive diuretic positive reentry probably AV node reentry  . Aortic stenosis     a. s/p AV balloon valvuloplasty by Dr. Excell Seltzer 11/2011. b. s/p TAVR 08/2014.  . Ischemic cardiomyopathy     a. 2D ECHO 11/2014: EF of 20-25%  . H/O: GI bleed   . Hypothyroidism   . GERD (gastroesophageal reflux disease)   . Falls frequently     3 times in the past week/notes 09/11/2013  . PAF (paroxysmal atrial fibrillation)     a. on amiodarone and eliquis   . Peripheral vascular disease     a. h/o ulcer on the left great toe s/p angioplasty of the left anterior tibial artery.  . OSA on CPAP   . Limited mobility   . S/P TAVR (transcatheter aortic valve replacement)     a. 08/2014: 29 mm Edwards Sapien XT transcatheter heart valve placed via open left transfemoral approach  . PAT (paroxysmal atrial tachycardia)   . Atrial flutter     Past Surgical History  Procedure Laterality Date  . Glaucoma surgery      Implantation of Baerveldt glaucoma device lant with scleral reinforcement using tutoplast tissue graft right eye. [Other]  . Coronary angioplasty with stent placement  Jan. 23, 2013  . Corneal transplant Right   . Angioplasty Left 03/30/2013    tibial  . Tonsillectomy  1946  . Cataract extraction w/ intraocular lens  implant, bilateral Bilateral   . Cardiac valve replacement  11/30/2011    aortic valve.?  . Transcatheter aortic valve replacement, transfemoral N/A 08/10/2014    Procedure: TRANSCATHETER AORTIC VALVE REPLACEMENT, TRANSFEMORAL;  Surgeon: Tonny Bollman, MD;  Location: [facility] OR;  Service: Open Heart Surgery;  Laterality: N/A;  . Tee without cardioversion N/A 08/10/2014    Procedure: TRANSESOPHAGEAL ECHOCARDIOGRAM (TEE);  Surgeon: Tonny Bollman, MD;  Location[facility] OR;  Service: Open Heart Surgery;  Laterality: N/A;  . Coronary angiogram  11/02/2011    Procedure: CORONARY  ANGIOGRAM;  Surgeon: Rollene Rotunda, MD;  Location: [facility] CATH LAB;  Service: Cardiovascular;;  . Left heart catheterization with coronary angiogram N/A 11/12/2011    Procedure: LEFT HEART CATHETERIZATION WITH CORONARY ANGIOGRAM;  Surgeon: Tonny Bollman, MD;  Location: [facility] CATH LAB;  Service: Cardiovascular;  Laterality: N/A;  . Percutaneous coronary stent intervention (pci-s) N/A 11/14/2011    Procedure: PERCUTANEOUS CORONARY STENT INTERVENTION (PCI-S);  Surgeon: Tonny Bollman, MD;  Location: [facility] CATH LAB;  Service: Cardiovascular;  Laterality: N/A;  . Abdominal aortagram N/A 03/30/2013    Procedure: ABDOMINAL Ronny Flurry;  Surgeon: Chuck Hint, MD;  Location: [facility] CATH LAB;  Service: Cardiovascular;  Laterality: N/A;  . Left and right heart catheterization with coronary angiogram N/A 07/16/2014    Procedure: LEFT AND RIGHT HEART CATHETERIZATION WITH CORONARY ANGIOGRAM;  Surgeon: Micheline Chapman, MD;  Location: [facility]  CATH LAB;  Service: Cardiovascular;  Laterality: N/A;     Current Outpatient Prescriptions  Medication Sig Dispense Refill  . acetaminophen (TYLENOL) 500 MG tablet Take 1-2 tablets (500-1,000 mg total) by mouth every 6 (six) hours as needed for mild pain or moderate pain.    Marland Kitchen. amiodarone (PACERONE) 200 MG tablet Take 1 tablet (200 mg total) by mouth 2 (two) times daily. 60 tablet 11  . apixaban (ELIQUIS) 2.5 MG TABS tablet Take 1 tablet (2.5 mg total) by mouth 2 (two) times daily. 60 tablet 11  . Calcium Carb-Cholecalciferol (CALCIUM 600+D) 600-800 MG-UNIT TABS Take 1 tablet by mouth daily.    Marland Kitchen. docusate sodium 100 MG CAPS Take 100 mg by mouth 2 (two) times daily as needed for mild constipation.    . ferrous sulfate 325 (65 FE) MG tablet Take 325 mg by mouth daily with breakfast.    . furosemide (LASIX) 40 MG tablet Take 1 tablet (40 mg total) by mouth daily. 30 tablet 12  . levofloxacin (LEVAQUIN) 750 MG tablet Take 1 tablet (750 mg total) by mouth every other day. 7 tablet 0  .  levothyroxine (SYNTHROID, LEVOTHROID) 25 MCG tablet TAKE 1 TABLET BY MOUTH EVERY DAY 30 tablet 3  . Multiple Vitamin (MULTIVITAMIN) tablet Take 1 tablet by mouth at bedtime.    . Multiple Vitamins-Minerals (PRESERVISION AREDS PO) Take 1 capsule by mouth 2 (two) times daily.    . nitroGLYCERIN (NITROSTAT) 0.4 MG SL tablet Place 1 tablet (0.4 mg total) under the tongue every 5 (five) minutes x 3 doses as needed for chest pain. 25 tablet 12  . oxybutynin (DITROPAN) 5 MG tablet Take 5 mg by mouth 2 (two) times daily.    . pantoprazole (PROTONIX) 40 MG tablet Take 40 mg by mouth daily.    . potassium chloride (K-DUR) 10 MEQ tablet Take 2 tablets (20 mEq total) by mouth daily. 60 tablet 11  . pravastatin (PRAVACHOL) 40 MG tablet Take 1 tablet (40 mg total) by mouth daily. 90 tablet 4  . tamsulosin (FLOMAX) 0.4 MG CAPS capsule Take 1 capsule (0.4 mg total) by mouth daily. 30 capsule 11  . terazosin (HYTRIN) 1 MG capsule TAKE ONE CAPSULE BY MOUTH AT BEDTIME 30 capsule 2  . traMADol (ULTRAM) 50 MG tablet Take 0.5 tablets (25 mg total) by mouth every 6 (six) hours as needed. 20 tablet 0  . Travoprost, BAK Free, (TRAVATAN) 0.004 % SOLN ophthalmic solution Place 1 drop into the right eye at bedtime.    . vitamin B-12 (CYANOCOBALAMIN) 1000 MCG tablet Take 1,000 mcg by mouth daily.     No current facility-administered medications for this visit.    Allergies:   Penicillins and Corn-containing products    Social History:  The patient  reports that he quit smoking about 56 years ago. His smoking use included Cigarettes. He has a 63 pack-year smoking history. He has never used smokeless tobacco. He reports that he does not drink alcohol or use illicit drugs.   Family History:  The patient's family history includes Cancer in his father and mother; Heart disease in his sister; Stroke in his sister. There is no history of Heart attack.    ROS:   Please see the history of present illness.   Review of Systems   Constitution: Negative for fever.  Respiratory: Negative for cough.   Gastrointestinal: Negative for hematochezia, melena, nausea and vomiting.  Genitourinary: Negative for dysuria.  All other systems reviewed and are negative.  PHYSICAL EXAM: VS:  BP 122/77 mmHg  Pulse 73  Ht  (1.702 m)  Wt 195 lb (88.451 kg)  BMI 30.53 kg/m2    Wt Readings from Last 3 Encounters:  12/15/14 195 lb (88.451 kg)  12/09/14 190 lb (86.183 kg)  12/02/14 192 lb 4.8 oz (87.227 kg)     GEN: Well nourished, well developed, in no acute distress HEENT: normal Neck: no JVD, no masses Cardiac:  Normal S1/S2, irreg irreg rhythm; no murmur ,  no rubs or gallops, trace edema  Respiratory:  clear to auscultation bilaterally, no wheezing, rhonchi or rales. GI: soft, nontender, nondistended, + BS MS: no deformity or atrophy Skin: warm and dry ;  L 2nd toe with shallow based ulcer and one larger ulcer distal to this with eschar formation Neuro:  CNs II-XII intact, Strength and sensation are intact Psych: Normal affect   EKG:  EKG is ordered today.  It demonstrates:   AFlutter vs coarse AFib, HR 73, no change from prior tracing   Recent Labs: 04/07/2014: Pro B Natriuretic peptide (BNP) 1533.0* 08/11/2014: Magnesium 1.8 11/26/2014: B Natriuretic Peptide 136.2*; TSH 1.248 11/30/2014: ALT 85* 12/01/2014: Hemoglobin 14.7; Platelets 161 12/02/2014: BUN 31*; Creatinine 1.35; Potassium 3.4*; Sodium 138    Lipid Panel    Component Value Date/Time   CHOL 132 11/27/2012 1205   TRIG 186* 11/27/2012 1205   HDL 35* 11/27/2012 1205   CHOLHDL 3.8 11/27/2012 1205   VLDL 37 11/27/2012 1205   LDLCALC 60 11/27/2012 1205      ASSESSMENT AND PLAN:  Chronic combined systolic and diastolic CHF (congestive heart failure):  Volume appears stable.  Continue current dose of Lasix.  Check FU BMET today.  Persistent atrial fibrillation:  AFib vs AFlutter.  Rate is controlled.  Suspect sepsis precipitated AFib >> acute  CHF.  Hopefully, he will go back into NSR on his own.      -  Continue current dose of Amiodarone.    -  Continue Eliquis. He appears to be tolerating this.      -  BMET, CBC today.    -  FU with Dr. Tonny Bollman or me soon.  If still in AFib/Flutter >> consider DCCV.  History of GI bleed:  No evidence of bleeding.  Check CBC today.   CKD (chronic kidney disease), unspecified stage :  Check BMET today.   Coronary artery disease involving native coronary artery of native heart without angina pectoris:  No angina.  He was to stop ASA with initiation of Eliquis.      -  DC ASA    -  Continue statin.    -  No beta blocker due to hx of bradycardia.  Eventually try to resume ARB.  Atherosclerotic PVD with ulceration:  He has a couple ulcers on his L 2nd toe.  His R foot is cooler than his L.  He will see his podiatrist soon.  He may need to follow up with [facility] as well, but will let him see podiatry first.  He is not a great candidate for aggressive intervention.   Aortic stenosis S/P TAVR (transcatheter aortic valve replacement):  Stable AVR by recent echo.    Ischemic cardiomyopathy:  As noted, he has not been treated with beta blocker due to propensity to have bradycardia.  He is off of ARB since hospitalization due to elevated creatinine.  Will try to resume this at FU if BP remains stable.  May need to resume at Losartan  25 mg QD.  Check BMET today.   Essential hypertension:  Controlled.   Hyperlipidemia:  Continue statin.  UTI/Sepsis:  Complete antibiotics as planned.    Current medicines are reviewed at length with the patient today.  The patient does not have concerns regarding medicines.  The following changes have been made:  DC ASA.  Labs/ tests ordered today include:   Orders Placed This Encounter  Procedures  . Basic metabolic panel  . CBC with Differential/Platelet  . EKG 12-Lead     Disposition:   FU with Dr. Tonny Bollman or me in 2-3 weeks.    Signed, Brynda Rim, MHS 12/15/2014 12:46 PM    [facility] [address] [place], [place], [place]  [address] Phone: [phone]; Fax: [phone]

## 2014-12-15 NOTE — Telephone Encounter (Signed)
s/w caregiver Casimiro NeedleMichael, [place]. Casimiro NeedleMichael notified of lab results with verbal understanding.

## 2014-12-28 ENCOUNTER — Other Ambulatory Visit: Payer: Self-pay | Admitting: Internal Medicine

## 2015-01-05 ENCOUNTER — Ambulatory Visit (INDEPENDENT_AMBULATORY_CARE_PROVIDER_SITE_OTHER): Payer: Medicare Other | Admitting: Cardiovascular Disease

## 2015-01-05 ENCOUNTER — Encounter: Payer: Self-pay | Admitting: Cardiovascular Disease

## 2015-01-05 VITALS — BP 140/90 | HR 66 | Ht 67.0 in | Wt 193.0 lb

## 2015-01-05 DIAGNOSIS — I359 Nonrheumatic aortic valve disorder, unspecified: Secondary | ICD-10-CM

## 2015-01-05 DIAGNOSIS — I200 Unstable angina: Secondary | ICD-10-CM

## 2015-01-05 NOTE — Patient Instructions (Signed)
Your physician recommends that you continue on your current medications as directed. Please refer to the Current Medication list given to you today.  Your physician wants you to follow-up in: 4 months with Dr. Cooper.  You will receive a reminder letter in the mail two months in advance. If you don't receive a letter, please call our office to schedule the follow-up appointment.  

## 2015-01-05 NOTE — Progress Notes (Signed)
Cardiology Office Note   Date:  01/05/2015   ID:  Eugene [name], DOB 12-Sep-1924, MRN [id number]  PCP:  Earl Lagos, MD  Cardiologist:  Tonny Bollman, MD    No chief complaint on file.    History of Present Illness: Eugene [name] is a 81 y.o. male who presents for follow-up of aortic valve disease, chronic diastolic heart failure, and CAD. His coronary artery disease dates back to the early 1990s when he presented with an inferior wall MI treated with balloon angioplasty of the right coronary artery. He underwent PCI of severe stenosis in the proximal LAD in 2013 after presenting with syncope and elevated cardiac enzymes. He was noted to have severe aortic stenosis at that time and underwent balloon aortic valvuloplasty. He also has peripheral arterial disease and has had nonhealing ulcerations. He underwent angioplasty of the left tibial artery in 2014. His severe aortic stenosis was ultimately treated with TAVR in November 2015 via a transfemoral approach. He's been treated with aspirin alone because of gastrointestinal bleeding on Plavix. However, he was hospitalized again with atrial fibrillation/flutter in February. He was started on Eliquis during that hospitalization. The patient presents today for follow-up evaluation.  The patient is here today with a nurse and his preacher who is his medical power of attorney. He has become increasingly weak. The patient's chronic shortness of breath is unchanged. He's had no recent chest pain. He does complain of gait instability. He denies orthopnea, PND, or syncope.  The patient's family has become increasingly concerned about his ability to care for himself.  Past Medical History  Diagnosis Date  . Glaucoma     Right eye  . Diverticulosis     multiple hospital admissions for GI bleed, has an episode roughly every 6 months,, last  colonscopy in 2006 showed diverticulosis, was evaluated by Dr. Madilyn Fireman in the hospital in 2011 for lower GI  bleed andd it  was suggested that the patient was not actively bleeding at that time and given his comorbid conditions colonoscopy was deferred. He is also chronically constipated  . Macular degeneration of right eye   . Allergic rhinitis   . CAD (coronary artery disease)     a. h/o remote inferior MI, h/o PCI to RCA in 1991. b. syncope 2013: severe AS and high grade prox LAD and other diffuse disease. High risk for CABG - s/p BMS to prox LAD and balloon aortic valvuloplasty. c. cath 07/16/14 - diffuse CAD, managed medically.  . Hypertension   . Hyperlipidemia   . Chronic combined systolic and diastolic CHF (congestive heart failure)     a. Most recent echo 08/2014: EF 30-35%.  . Supraventricular tachycardia     2 syndromes-nonsustained atrial tachycardia//adenosine responsive diuretic positive reentry probably AV node reentry  . Aortic stenosis     a. s/p AV balloon valvuloplasty by Dr. Excell Seltzer 11/2011. b. s/p TAVR 08/2014.  . Ischemic cardiomyopathy     a. 2D ECHO 11/2014: EF of 20-25%  . H/O: GI bleed   . Hypothyroidism   . GERD (gastroesophageal reflux disease)   . Falls frequently     3 times in the past week/notes 09/11/2013  . PAF (paroxysmal atrial fibrillation)     a. on amiodarone and eliquis   . Peripheral vascular disease     a. h/o ulcer on the left great toe s/p angioplasty of the left anterior tibial artery.  . OSA on CPAP   . Limited mobility   . S/P  TAVR (transcatheter aortic valve replacement)     a. 08/2014: 29 mm Edwards Sapien XT transcatheter heart valve placed via open left transfemoral approach  . PAT (paroxysmal atrial tachycardia)   . Atrial flutter     Past Surgical History  Procedure Laterality Date  . Glaucoma surgery      Implantation of Baerveldt glaucoma device lant with scleral reinforcement using tutoplast tissue graft right eye. [Other]  . Coronary angioplasty with stent placement  Jan. 23, 2013  . Corneal transplant Right   . Angioplasty Left  03/30/2013    tibial  . Tonsillectomy  1946  . Cataract extraction w/ intraocular lens  implant, bilateral Bilateral   . Cardiac valve replacement  11/30/2011    aortic valve.?  . Transcatheter aortic valve replacement, transfemoral N/A 08/10/2014    Procedure: TRANSCATHETER AORTIC VALVE REPLACEMENT, TRANSFEMORAL;  Surgeon: Tonny BollmanMichael Dshawn Mcnay, MD;  [facility];  Service: Open Heart Surgery;  Laterality: N/A;  . Tee without cardioversion N/A 08/10/2014    Procedure: TRANSESOPHAGEAL ECHOCARDIOGRAM (TEE);  Surgeon: Tonny BollmanMichael Nader Boys, MD;  [facility];  Service: Open Heart Surgery;  Laterality: N/A;  . Coronary angiogram  11/02/2011    Procedure: CORONARY ANGIOGRAM;  Surgeon: Rollene RotundaJames Hochrein, MD;  [facility];  Service: Cardiovascular;;  . Left heart catheterization with coronary angiogram N/A 11/12/2011    Procedure: LEFT HEART CATHETERIZATION WITH CORONARY ANGIOGRAM;  Surgeon: Tonny BollmanMichael Anyae Griffith, MD;  [facility];  Service: Cardiovascular;  Laterality: N/A;  . Percutaneous coronary stent intervention (pci-s) N/A 11/14/2011    Procedure: PERCUTANEOUS CORONARY STENT INTERVENTION (PCI-S);  Surgeon: Tonny BollmanMichael Chelsi Warr, MD;  [facility];  Service: Cardiovascular;  Laterality: N/A;  . Abdominal aortagram N/A 03/30/2013    Procedure: ABDOMINAL Ronny FlurryAORTAGRAM;  Surgeon: Chuck Hinthristopher S Dickson, MD;  [facility];  Service: Cardiovascular;  Laterality: N/A;  . Left and right heart catheterization with coronary angiogram N/A 07/16/2014    Procedure: LEFT AND RIGHT HEART CATHETERIZATION WITH CORONARY ANGIOGRAM;  Surgeon: Micheline ChapmanMichael D [name], MD;  [facility];  Service: Cardiovascular;  Laterality: N/A;    Current Outpatient Prescriptions  Medication Sig Dispense Refill  . acetaminophen (TYLENOL) 500 MG tablet Take 1-2 tablets (500-1,000 mg total) by mouth every 6 (six) hours as needed for mild pain or moderate pain.    Marland Kitchen. amiodarone (PACERONE) 200 MG tablet Take 1 tablet (200 mg total)  by mouth 2 (two) times daily. 60 tablet 11  . apixaban (ELIQUIS) 2.5 MG TABS tablet Take 1 tablet (2.5 mg total) by mouth 2 (two) times daily. 60 tablet 11  . Calcium Carb-Cholecalciferol (CALCIUM 600+D) 600-800 MG-UNIT TABS Take 1 tablet by mouth daily.    Marland Kitchen. docusate sodium 100 MG CAPS Take 100 mg by mouth 2 (two) times daily as needed for mild constipation.    . ferrous sulfate 325 (65 FE) MG tablet Take 325 mg by mouth daily with breakfast.    . furosemide (LASIX) 40 MG tablet Take 1 tablet (40 mg total) by mouth daily. 30 tablet 12  . levofloxacin (LEVAQUIN) 750 MG tablet Take 1 tablet (750 mg total) by mouth every other day. 7 tablet 0  . levothyroxine (SYNTHROID, LEVOTHROID) 25 MCG tablet TAKE 1 TABLET BY MOUTH EVERY DAY 30 tablet 3  . Multiple Vitamin (MULTIVITAMIN) tablet Take 1 tablet by mouth at bedtime.    . Multiple Vitamins-Minerals (PRESERVISION AREDS PO) Take 1 capsule by mouth 2 (two) times daily.    . nitroGLYCERIN (NITROSTAT) 0.4  MG SL tablet Place 1 tablet (0.4 mg total) under the tongue every 5 (five) minutes x 3 doses as needed for chest pain. 25 tablet 12  . oxybutynin (DITROPAN) 5 MG tablet Take 5 mg by mouth 2 (two) times daily.    . pantoprazole (PROTONIX) 40 MG tablet Take 40 mg by mouth daily.    . potassium chloride (K-DUR) 10 MEQ tablet Take 2 tablets (20 mEq total) by mouth daily. 60 tablet 11  . pravastatin (PRAVACHOL) 40 MG tablet Take 1 tablet (40 mg total) by mouth daily. 90 tablet 4  . tamsulosin (FLOMAX) 0.4 MG CAPS capsule Take 1 capsule (0.4 mg total) by mouth daily. 30 capsule 11  . terazosin (HYTRIN) 1 MG capsule TAKE ONE CAPSULE BY MOUTH AT BEDTIME 30 capsule 2  . traMADol (ULTRAM) 50 MG tablet Take 0.5 tablets (25 mg total) by mouth every 6 (six) hours as needed. 20 tablet 0  . Travoprost, BAK Free, (TRAVATAN) 0.004 % SOLN ophthalmic solution Place 1 drop into the right eye at bedtime.    . vitamin B-12 (CYANOCOBALAMIN) 1000 MCG tablet Take 1,000 mcg by  mouth daily.     No current facility-administered medications for this visit.    Allergies:   Corn-containing products and Penicillins   Social History:  The patient  reports that he quit smoking about 56 years ago. His smoking use included Cigarettes. He has a 63 pack-year smoking history. He has never used smokeless tobacco. He reports that he does not drink alcohol or use illicit drugs.   Family History:  The patient's family history includes Cancer in his father and mother; Heart disease in his sister; Stroke in his sister. There is no history of Heart attack.    ROS:  Please see the history of present illness.  Otherwise, review of systems is positive for weakness, gait instability, dyspnea on exertion.  All other systems are reviewed and negative.    PHYSICAL EXAM: VS:  BP 140/90 mmHg  Pulse 66  Ht  (1.702 m)  Wt 193 lb (87.544 kg)  BMI 30.22 kg/m2  SpO2 91% , BMI Body mass index is 30.22 kg/(m^2). GEN: Pleasant, elderly male, in no acute distress HEENT: normal Neck: no JVD, no masses. No carotid bruits Cardiac: RRR without murmur or gallop, distant heart sounds                Respiratory:  clear to auscultation bilaterally, normal work of breathing GI: soft, nontender, nondistended, + BS MS: no deformity or atrophy Ext: no pretibial edema Skin: warm and dry, no rash Neuro:  Strength and sensation are intact Psych: euthymic mood, full affect  EKG:  EKG is not ordered today.  Recent Labs: 04/07/2014: Pro B Natriuretic peptide (BNP) 1533.0* 08/11/2014: Magnesium 1.8 11/26/2014: B Natriuretic Peptide 136.2*; TSH 1.248 11/30/2014: ALT 85* 12/15/2014: BUN 29*; Creatinine 1.31; Hemoglobin 15.7; Platelets 244.0; Potassium 4.2; Sodium 137   Lipid Panel     Component Value Date/Time   CHOL 132 11/27/2012 1205   TRIG 186* 11/27/2012 1205   HDL 35* 11/27/2012 1205   CHOLHDL 3.8 11/27/2012 1205   VLDL 37 11/27/2012 1205   LDLCALC 60 11/27/2012 1205      Wt Readings from  Last 3 Encounters:  01/05/15 193 lb (87.544 kg)  12/15/14 195 lb (88.451 kg)  12/09/14 190 lb (86.183 kg)     Cardiac Studies Reviewed: 2-D echocardiogram 10/19/2014: Study Conclusions - Left ventricle: The cavity size was mildly dilated. Wall thickness  was increased in a pattern of moderate LVH. Systolic function was severely reduced. The estimated ejection fraction was in the range of 20% to 25%. Diffuse hypokinesis. There is akinesis of the basal-midinferolateral myocardium. Doppler parameters are consistent with high ventricular filling pressure. - Aortic valve: A bioprosthesis was present. - Ascending aorta: The ascending aorta was mildly dilated. - Mitral valve: Calcified annulus. There was mild regurgitation. - Left atrium: The atrium was moderately dilated.  Impressions:  - Technically difficult; severe LV dysfunction; elevated LV filling pressure; s/p TAVR with normal mean gradient of 9 mmHg; no AI; moderate LAE; mild MR.  ASSESSMENT AND PLAN: 1.  CAD status post PCI: No symptoms of angina. Continue current medical program. No antiplatelet therapy because of anticoagulation with Eliquis. No beta blocker because of history of bradycardia arrhythmias.  2. Aortic valve disorder status post TAVR. Normal valve function by echo in January 2016.  3. Chronic systolic heart failure, New York Heart Association functional class III. The patient continues to have severe LV systolic dysfunction. Considering his frailty and progressive weakness, I am not inclined to escalate his medical therapy for heart failure as I do not think he will tolerate this well. He is on furosemide 40 mg daily.  4. Paroxysmal atrial fibrillation. Continue amiodarone for rhythm control and Eliquis for anticoagulation.  Disposition: We discussed his specific needs. He is fairly adamant about staying in his home. He is not interested in assisted living or skilled nursing. He states "I might as  well die from going to do that." The family will continue to work through these issues. He has some nursing help at home.   Current medicines are reviewed with the patient today.  The patient does not have concerns regarding medicines.  The following changes have been made:  no change  Labs/ tests ordered today include:  No orders of the defined types were placed in this encounter.    Disposition:   FU 4 months  Signed, Tonny Bollman, MD  01/05/2015 11:09 PM    [facility] [address] [place], [place], [place]  [address] Phone: [phone]; Fax: [phone]

## 2015-01-06 ENCOUNTER — Telehealth: Payer: Self-pay | Admitting: *Deleted

## 2015-01-06 MED ORDER — POLYETHYLENE GLYCOL 3350 17 G PO PACK: 17.0000 g | PACK | Freq: Every day | ORAL | Status: DC | PRN

## 2015-01-06 NOTE — Telephone Encounter (Signed)
I prescribed miralax for him. Please have him try this and call me if no improvement. Thank you

## 2015-01-06 NOTE — Telephone Encounter (Signed)
Pt's caretaker calls and states pt has not had a BM in several days, he states he gives him a big bowl of prunes everyday and stool softner twice daily, and his stools are always very hard and large, nothing has happened in about 3 days, he doesn't want him to go much longer for fear it will be too painful for pt. Please advise

## 2015-01-07 ENCOUNTER — Other Ambulatory Visit: Payer: Self-pay | Admitting: Internal Medicine

## 2015-01-07 ENCOUNTER — Telehealth: Payer: Self-pay | Admitting: Cardiovascular Disease

## 2015-01-07 NOTE — Telephone Encounter (Signed)
Called pt's caregiver and informed him of med and to call back if it does not help

## 2015-01-07 NOTE — Telephone Encounter (Signed)
Contacted by pt Physical Therapist, Lyla Sonarrie. Called pt home and spoke with pt caregiver Kathlene NovemberMike as he wanted to update Dr. [name] on pt falls recently.  According to caregiver pt has not been compliant with calling for help and has been trying to do things on his own.  About 4-5 days ago pt tried to go to potty chair alone and fell backwards in chair.  Pt got a scrape on his left arm which [facility]  About 3 days ago pt was trying to walk to door to let the cat in and fell backwards when opening the door.  When pt falls Kathlene NovemberMike has to call EMS to help get him up to a safe position.  Each time EMS evaluates pts VS and assesses needs to go to the hospital.   Kathlene NovemberMike also indicated that pt POA and niece and nephew have started to look for a facility for pt to live in full time as pt is becoming for a risk to his health.

## 2015-01-07 NOTE — Telephone Encounter (Signed)
New message  Eugene SonCarrie with [facility] called.reports she is working with a Client whom has fallen twice. Today and the day prior. No injuries but EMS came out to assist. Requests a call back to discuss further if needed

## 2015-01-17 ENCOUNTER — Telehealth: Payer: Self-pay | Admitting: Cardiovascular Disease

## 2015-01-17 NOTE — Telephone Encounter (Signed)
I spoke with Rodena GoldmannSreejesh and made him aware that PT can be extended for the pt .

## 2015-01-17 NOTE — Telephone Encounter (Signed)
Left message on machine for Sreejesh  to contact the office. Okay to extend physical therapy.

## 2015-01-17 NOTE — Telephone Encounter (Signed)
New message      Calling to extend pts physical therapy

## 2015-01-25 ENCOUNTER — Other Ambulatory Visit: Payer: Self-pay | Admitting: Physician Assistant

## 2015-01-27 ENCOUNTER — Telehealth: Payer: Self-pay | Admitting: Cardiovascular Disease

## 2015-01-27 NOTE — Telephone Encounter (Signed)
New Message      Physical therapist calling to notify Dr. [name] that they will be extending pt for pt for 2 more weeks, 2 times a week and that [facility]

## 2015-01-27 NOTE — Telephone Encounter (Signed)
Ok thx.

## 2015-02-01 ENCOUNTER — Ambulatory Visit (INDEPENDENT_AMBULATORY_CARE_PROVIDER_SITE_OTHER): Payer: Medicare Other | Admitting: *Deleted

## 2015-02-01 DIAGNOSIS — Z111 Encounter for screening for respiratory tuberculosis: Secondary | ICD-10-CM

## 2015-02-01 NOTE — Progress Notes (Signed)
Pt here to have tb skin test placed.  Will f/u in 2 days to have test read.Eugene [name], Eugene Tang Cassady4/26/201611:19 AM

## 2015-02-03 ENCOUNTER — Ambulatory Visit (INDEPENDENT_AMBULATORY_CARE_PROVIDER_SITE_OTHER): Payer: Medicare Other | Admitting: Internal Medicine

## 2015-02-03 ENCOUNTER — Encounter: Payer: Self-pay | Admitting: Internal Medicine

## 2015-02-03 DIAGNOSIS — Z7901 Long term (current) use of anticoagulants: Secondary | ICD-10-CM | POA: Diagnosis not present

## 2015-02-03 DIAGNOSIS — E785 Hyperlipidemia, unspecified: Secondary | ICD-10-CM | POA: Diagnosis not present

## 2015-02-03 DIAGNOSIS — I4892 Unspecified atrial flutter: Secondary | ICD-10-CM

## 2015-02-03 LAB — LIPID PANEL
Cholesterol: 154 mg/dL (ref 0–200)
HDL: 39 mg/dL — ABNORMAL LOW (ref >=40)
LDL CALC: 62 mg/dL (ref 0–99)
TRIGLYCERIDES: 267 mg/dL — AB (ref <150)
Total CHOL/HDL Ratio: 3.9 Ratio
VLDL: 53 mg/dL — AB (ref 0–40)

## 2015-02-03 NOTE — Assessment & Plan Note (Addendum)
-   Will recheck lipid panel today - Patient is compliant with pravastatin - No reported side effects on medication

## 2015-02-03 NOTE — Progress Notes (Signed)
   Subjective:    Patient ID: Eugene [name], male    DOB: June 09, 1924, 81 y.o.   MRN: [id number]  HPI Patient is here for routine follow up of his aflutter and hyperlipidemia.  He feels well and will be placed in an assisted living next week. He has had an increased incidence of falls at home and has been working with home PT.   Of note, he is also on Eliquis for his afib/aflutter and has noted increased bleeding from his scrapes after he falls.  He feels he would be safer at an assisted living facility.  Patient also had a PPD placed 2 days ago with 0 mm induration today so has a negative PPD  No other new complaints today  Review of Systems  Constitutional: Negative.   HENT: Negative.   Eyes: Negative.   Respiratory: Negative.   Cardiovascular: Negative.   Gastrointestinal: Negative.   Genitourinary: Negative.   Musculoskeletal: Positive for gait problem. Negative for myalgias, back pain, joint swelling and arthralgias.  Skin: Negative.   Neurological: Negative.   Hematological: Bruises/bleeds easily.  Psychiatric/Behavioral: Negative.        Objective:   Physical Exam  Constitutional: He is oriented to person, place, and time. He appears well-developed and well-nourished.  HENT:  Head: Normocephalic and atraumatic.  Eyes: Conjunctivae are normal. Right eye exhibits no discharge. Left eye exhibits no discharge.  Neck: Normal range of motion. Neck supple.  Cardiovascular: Normal rate, regular rhythm and normal heart sounds.   Pulmonary/Chest: Effort normal and breath sounds normal. No respiratory distress. He has no wheezes.  Abdominal: Soft. Bowel sounds are normal. He exhibits no distension. There is no tenderness.  Musculoskeletal: Normal range of motion. He exhibits no edema or tenderness.  Neurological: He is alert and oriented to person, place, and time.  Skin: Skin is warm and dry.  Psychiatric: He has a normal mood and affect. His behavior is normal.          Assessment & Plan:  Please see problem based charting for details

## 2015-02-03 NOTE — Patient Instructions (Signed)
-   It was a pleasure seeing you today - We will check a cholesterol panel on you today - We will discuss the FL-2 and wheelchair request with Edson SnowballShana, our social worker on Monday - I will see you again in 3 months

## 2015-02-03 NOTE — Assessment & Plan Note (Signed)
-   Patient with regular rate and rhythm on exam today - No further episodes of palpitations or CP. No SOB/DOE - Will continue with current meds per cardio for now - Will maintain on a/c for now

## 2015-02-08 LAB — TB SKIN TEST
INDURATION: 0 mm
TB SKIN TEST: NEGATIVE

## 2015-02-09 ENCOUNTER — Other Ambulatory Visit: Payer: Self-pay | Admitting: Internal Medicine

## 2015-02-17 ENCOUNTER — Telehealth: Payer: Self-pay | Admitting: Cardiovascular Disease

## 2015-02-17 ENCOUNTER — Emergency Department (HOSPITAL_COMMUNITY): Payer: Medicare Other

## 2015-02-17 ENCOUNTER — Emergency Department (HOSPITAL_COMMUNITY)
Admission: EM | Admit: 2015-02-17 | Discharge: 2015-02-17 | Disposition: A | Discharge status: Home / Self Care | Payer: Medicare Other | Attending: Emergency Medicine | Admitting: Emergency Medicine

## 2015-02-17 ENCOUNTER — Encounter (HOSPITAL_COMMUNITY): Payer: Self-pay | Admitting: Radiology

## 2015-02-17 DIAGNOSIS — I2510 Atherosclerotic heart disease of native coronary artery without angina pectoris: Secondary | ICD-10-CM | POA: Diagnosis not present

## 2015-02-17 DIAGNOSIS — I10 Essential (primary) hypertension: Secondary | ICD-10-CM | POA: Diagnosis not present

## 2015-02-17 DIAGNOSIS — E785 Hyperlipidemia, unspecified: Secondary | ICD-10-CM | POA: Diagnosis not present

## 2015-02-17 DIAGNOSIS — K219 Gastro-esophageal reflux disease without esophagitis: Secondary | ICD-10-CM | POA: Insufficient documentation

## 2015-02-17 DIAGNOSIS — Y9289 Other specified places as the place of occurrence of the external cause: Secondary | ICD-10-CM | POA: Insufficient documentation

## 2015-02-17 DIAGNOSIS — I739 Peripheral vascular disease, unspecified: Secondary | ICD-10-CM | POA: Diagnosis not present

## 2015-02-17 DIAGNOSIS — Z9981 Dependence on supplemental oxygen: Secondary | ICD-10-CM | POA: Insufficient documentation

## 2015-02-17 DIAGNOSIS — I480 Paroxysmal atrial fibrillation: Secondary | ICD-10-CM | POA: Insufficient documentation

## 2015-02-17 DIAGNOSIS — Z8709 Personal history of other diseases of the respiratory system: Secondary | ICD-10-CM | POA: Insufficient documentation

## 2015-02-17 DIAGNOSIS — Z954 Presence of other heart-valve replacement: Secondary | ICD-10-CM | POA: Diagnosis not present

## 2015-02-17 DIAGNOSIS — E039 Hypothyroidism, unspecified: Secondary | ICD-10-CM | POA: Insufficient documentation

## 2015-02-17 DIAGNOSIS — S0990XA Unspecified injury of head, initial encounter: Secondary | ICD-10-CM | POA: Insufficient documentation

## 2015-02-17 DIAGNOSIS — W19XXXA Unspecified fall, initial encounter: Secondary | ICD-10-CM

## 2015-02-17 DIAGNOSIS — I5042 Chronic combined systolic (congestive) and diastolic (congestive) heart failure: Secondary | ICD-10-CM | POA: Diagnosis not present

## 2015-02-17 DIAGNOSIS — G4733 Obstructive sleep apnea (adult) (pediatric): Secondary | ICD-10-CM | POA: Insufficient documentation

## 2015-02-17 DIAGNOSIS — H409 Unspecified glaucoma: Secondary | ICD-10-CM | POA: Diagnosis not present

## 2015-02-17 DIAGNOSIS — W01198A Fall on same level from slipping, tripping and stumbling with subsequent striking against other object, initial encounter: Secondary | ICD-10-CM | POA: Insufficient documentation

## 2015-02-17 DIAGNOSIS — Y998 Other external cause status: Secondary | ICD-10-CM | POA: Insufficient documentation

## 2015-02-17 DIAGNOSIS — Z87891 Personal history of nicotine dependence: Secondary | ICD-10-CM | POA: Insufficient documentation

## 2015-02-17 DIAGNOSIS — Z9861 Coronary angioplasty status: Secondary | ICD-10-CM | POA: Diagnosis not present

## 2015-02-17 DIAGNOSIS — Y9389 Activity, other specified: Secondary | ICD-10-CM | POA: Insufficient documentation

## 2015-02-17 DIAGNOSIS — Z9889 Other specified postprocedural states: Secondary | ICD-10-CM | POA: Diagnosis not present

## 2015-02-17 DIAGNOSIS — Z880 Allergy status to penicillin: Secondary | ICD-10-CM | POA: Diagnosis not present

## 2015-02-17 DIAGNOSIS — Z79899 Other long term (current) drug therapy: Secondary | ICD-10-CM | POA: Diagnosis not present

## 2015-02-17 DIAGNOSIS — Z9181 History of falling: Secondary | ICD-10-CM | POA: Diagnosis not present

## 2015-02-17 DIAGNOSIS — Z7902 Long term (current) use of antithrombotics/antiplatelets: Secondary | ICD-10-CM | POA: Diagnosis not present

## 2015-02-17 DIAGNOSIS — G8929 Other chronic pain: Secondary | ICD-10-CM | POA: Insufficient documentation

## 2015-02-17 DIAGNOSIS — M549 Dorsalgia, unspecified: Secondary | ICD-10-CM | POA: Diagnosis not present

## 2015-02-17 NOTE — ED Notes (Signed)
Pt back from CT.  TV on, lights out, call bell in reach and  pt states "I'm in heaven"

## 2015-02-17 NOTE — ED Notes (Signed)
Kathlene NovemberMike, a friend of patient has arrived will [facility]

## 2015-02-17 NOTE — ED Notes (Signed)
Patient transported to CT 

## 2015-02-17 NOTE — ED Notes (Signed)
Pt is not open to going to [facility] at this time, f/u calls placed to POA with no aswer

## 2015-02-17 NOTE — ED Provider Notes (Signed)
CSN: [id number]     Arrival date & time 02/17/15  0946 History   First MD Initiated Contact with Patient 02/17/15 [phone]     Chief Complaint  Patient presents with  . Fall     (Consider location/radiation/quality/duration/timing/severity/associated sxs/prior Treatment) Patient is a 81 y.o. male presenting with fall. The history is provided by the patient.  Fall Associated symptoms include headaches. Pertinent negatives include no abdominal pain and no shortness of breath.   patient with fall. Mechanical that he missed his walker and landed on his rear ended the back of his head. States he had a slight headache that is since improved. He has some chronic pain in his back and but states it is unchanged. Patient is not sure what medication he is on bed old medication list from 3 months ago showed he was on Eliquis. No chest or abdominal pain. No numbness weakness. No confusion.  Past Medical History  Diagnosis Date  . Glaucoma     Right eye  . Diverticulosis     multiple hospital admissions for GI bleed, has an episode roughly every 6 months,, last  colonscopy in 2006 showed diverticulosis, was evaluated by Dr. Madilyn Fireman in the hospital in 2011 for lower GI bleed andd it  was suggested that the patient was not actively bleeding at that time and given his comorbid conditions colonoscopy was deferred. He is also chronically constipated  . Macular degeneration of right eye   . Allergic rhinitis   . CAD (coronary artery disease)     a. h/o remote inferior MI, h/o PCI to RCA in 1991. b. syncope 2013: severe AS and high grade prox LAD and other diffuse disease. High risk for CABG - s/p BMS to prox LAD and balloon aortic valvuloplasty. c. cath 07/16/14 - diffuse CAD, managed medically.  . Hypertension   . Hyperlipidemia   . Chronic combined systolic and diastolic CHF (congestive heart failure)     a. Most recent echo 08/2014: EF 30-35%.  . Supraventricular tachycardia     2 syndromes-nonsustained atrial  tachycardia//adenosine responsive diuretic positive reentry probably AV node reentry  . Aortic stenosis     a. s/p AV balloon valvuloplasty by Dr. Excell Seltzer 11/2011. b. s/p TAVR 08/2014.  . Ischemic cardiomyopathy     a. 2D ECHO 11/2014: EF of 20-25%  . H/O: GI bleed   . Hypothyroidism   . GERD (gastroesophageal reflux disease)   . Falls frequently     3 times in the past week/notes 09/11/2013  . PAF (paroxysmal atrial fibrillation)     a. on amiodarone and eliquis   . Peripheral vascular disease     a. h/o ulcer on the left great toe s/p angioplasty of the left anterior tibial artery.  . OSA on CPAP   . Limited mobility   . S/P TAVR (transcatheter aortic valve replacement)     a. 08/2014: 29 mm Edwards Sapien XT transcatheter heart valve placed via open left transfemoral approach  . PAT (paroxysmal atrial tachycardia)   . Atrial flutter    Past Surgical History  Procedure Laterality Date  . Glaucoma surgery      Implantation of Baerveldt glaucoma device lant with scleral reinforcement using tutoplast tissue graft right eye. [Other]  . Coronary angioplasty with stent placement  Jan. 23, 2013  . Corneal transplant Right   . Angioplasty Left 03/30/2013    tibial  . Tonsillectomy  1946  . Cataract extraction w/ intraocular lens  implant, bilateral Bilateral   .  Cardiac valve replacement  11/30/2011    aortic valve.?  . Transcatheter aortic valve replacement, transfemoral N/A 08/10/2014    Procedure: TRANSCATHETER AORTIC VALVE REPLACEMENT, TRANSFEMORAL;  Surgeon: Tonny Bollman, MD;  [facility];  Service: Open Heart Surgery;  Laterality: N/A;  . Tee without cardioversion N/A 08/10/2014    Procedure: TRANSESOPHAGEAL ECHOCARDIOGRAM (TEE);  Surgeon: Tonny Bollman, MD;  Location: [facility] OR;  Service: Open Heart Surgery;  Laterality: N/A;  . Coronary angiogram  11/02/2011    Procedure: CORONARY ANGIOGRAM;  Surgeon: Rollene Rotunda, MD;  Location[facility] CATH LAB;  Service: Cardiovascular;;  . Left  heart catheterization with coronary angiogram N/A 11/12/2011    Procedure: LEFT HEART CATHETERIZATION WITH CORONARY ANGIOGRAM;  Surgeon: Tonny Bollman, MD;  Location: [facility] CATH LAB;  Service: Cardiovascular;  Laterality: N/A;  . Percutaneous coronary stent intervention (pci-s) N/A 11/14/2011    Procedure: PERCUTANEOUS CORONARY STENT INTERVENTION (PCI-S);  Surgeon: Tonny Bollman, MD;  Location: [facility] CATH LAB;  Service: Cardiovascular;  Laterality: N/A;  . Abdominal aortagram N/A 03/30/2013    Procedure: ABDOMINAL Ronny Flurry;  Surgeon: Chuck Hint, MD;  Location: [facility] CATH LAB;  Service: Cardiovascular;  Laterality: N/A;  . Left and right heart catheterization with coronary angiogram N/A 07/16/2014    Procedure: LEFT AND RIGHT HEART CATHETERIZATION WITH CORONARY ANGIOGRAM;  Surgeon: Micheline Chapman, MD;  Location: [facility] CATH LAB;  Service: Cardiovascular;  Laterality: N/A;   Family History  Problem Relation Age of Onset  . Cancer Mother     Ovarian Cancer  . Cancer Father     Lung  . Heart disease Sister     Heart disease before age 74  . Stroke Sister   . Heart attack Neg Hx    History  Substance Use Topics  . Smoking status: Former Smoker -- 3.00 packs/day for 21 years    Types: Cigarettes    Quit date: 10/08/1958  . Smokeless tobacco: Never Used  . Alcohol Use: No    Review of Systems  Constitutional: Negative for chills.  Respiratory: Negative for shortness of breath.   Cardiovascular: Negative for leg swelling.  Gastrointestinal: Negative for abdominal pain and blood in stool.  Genitourinary: Negative for flank pain.  Musculoskeletal: Positive for back pain.  Skin: Negative for wound.  Neurological: Positive for headaches.      Allergies  Corn-containing products and Penicillins  Home Medications   Prior to Admission medications   Medication Sig Start Date End Date Taking? Authorizing Provider  acetaminophen (TYLENOL) 500 MG tablet Take 1-2 tablets (500-1,000 mg  total) by mouth every 6 (six) hours as needed for mild pain or moderate pain. Patient taking differently: Take 1,000 mg by mouth every 6 (six) hours as needed for mild pain or moderate pain.  08/13/14  Yes Dayna N Dunn, PA-C  amiodarone (PACERONE) 200 MG tablet Take 1 tablet (200 mg total) by mouth 2 (two) times daily. 12/02/14  Yes Janetta Hora, PA-C  apixaban (ELIQUIS) 2.5 MG TABS tablet Take 1 tablet (2.5 mg total) by mouth 2 (two) times daily. 12/02/14  Yes Janetta Hora, PA-C  Calcium Carb-Cholecalciferol (CALCIUM 600+D) 600-800 MG-UNIT TABS Take 1 tablet by mouth daily.   Yes Historical Provider, MD  docusate sodium 100 MG CAPS Take 100 mg by mouth 2 (two) times daily as needed for mild constipation. Patient taking differently: Take 100 mg by mouth every 12 (twelve) hours as needed (for constipation).  08/13/14  Yes Dayna N Dunn, PA-C  ferrous sulfate 325 (  65 FE) MG tablet Take 325 mg by mouth daily with breakfast.   Yes Historical Provider, MD  furosemide (LASIX) 40 MG tablet Take 1 tablet (40 mg total) by mouth daily. 12/02/14  Yes Janetta HoraKathryn R Thompson, PA-C  levofloxacin [facility](LEVAQUIN) 750 MG tablet Take 1 tablet by mouth every other day 01/27/15  Yes Tonny BollmanMichael Cooper, MD  levothyroxine (SYNTHROID, LEVOTHROID) 25 MCG tablet TAKE 1 TABLET BY MOUTH EVERY DAY 02/09/15  Yes Earl LagosNischal Narendra, MD  Multiple Vitamin (MULTIVITAMIN) tablet Take 1 tablet by mouth at bedtime.   Yes Historical Provider, MD  Multiple Vitamins-Minerals (PRESERVISION AREDS PO) Take 1 capsule by mouth 2 (two) times daily.   Yes Historical Provider, MD  nitroGLYCERIN (NITROSTAT) 0.4 MG SL tablet Place 1 tablet (0.4 mg total) under the tongue every 5 (five) minutes x 3 doses as needed for chest pain. 12/02/14  Yes Janetta HoraKathryn R Thompson, PA-C  oxybutynin (DITROPAN) 5 MG tablet Take 5 mg by mouth 2 (two) times daily.   Yes Historical Provider, MD  pantoprazole (PROTONIX) 40 MG tablet Take 40 mg by mouth daily.   Yes Historical Provider,  MD  polyethylene glycol (MIRALAX / GLYCOLAX) packet Take 17 g by mouth daily as needed. Patient taking differently: Take 17 g by mouth daily as needed for mild constipation.  01/06/15  Yes Nischal Heide SparkNarendra, MD  potassium chloride (K-DUR) 10 MEQ tablet Take 2 tablets (20 mEq total) by mouth daily. 12/02/14  Yes Janetta HoraKathryn R Thompson, PA-C  pravastatin (PRAVACHOL) 40 MG tablet Take 1 tablet (40 mg total) by mouth daily. 06/17/14  Yes Nischal Heide SparkNarendra, MD  tamsulosin (FLOMAX) 0.4 MG CAPS capsule Take 1 capsule (0.4 mg total) by mouth daily. 12/02/14  Yes Janetta HoraKathryn R Thompson, PA-C  terazosin (HYTRIN) 1 MG capsule TAKE ONE CAPSULE BY MOUTH AT BEDTIME Patient taking differently: TAKE ONE CAPSULE BY MOUTH every morning 12/28/14  Yes Nischal Narendra, MD  traMADol (ULTRAM) 50 MG tablet Take 0.5 tablets (25 mg total) by mouth every 6 (six) hours as needed. Patient taking differently: Take 25 mg by mouth every 6 (six) hours as needed for moderate pain.  11/15/14 11/15/15 Yes Rushil Terrilee CroakPatel V, MD  Travoprost, BAK Free, (TRAVATAN) 0.004 % SOLN ophthalmic solution Place 1 drop into the right eye at bedtime.   Yes Historical Provider, MD   BP 130/78 mmHg  Pulse 60  Temp(Src) 98.8 F (37.1 C) (Oral)  Resp 18  SpO2 96% Physical Exam  Constitutional: He appears well-developed.  HENT:  Head: Normocephalic and atraumatic.  Eyes: Pupils are equal, round, and reactive to light.  Neck: Normal range of motion. Neck supple.  Cardiovascular: Normal rate.   Pulmonary/Chest: Effort normal.  Abdominal: Soft. There is no tenderness.  Musculoskeletal: He exhibits no tenderness.  No tenderness to back of skull. No tenderness to lumbar spine or pelvis. Good range of motion bilateral hips.  Neurological: He is alert.  Skin: Skin is warm.    ED Course  Procedures (including critical care time) Labs Review Labs Reviewed - No data to display  Imaging Review Ct Head Wo Contrast  02/17/2015   CLINICAL DATA:  81 year old  hypertensive male fell out of wheelchair. On anticoagulants. Initial encounter.  EXAM: CT HEAD WITHOUT CONTRAST  TECHNIQUE: Contiguous axial images were obtained from the base of the skull through the vertex without intravenous contrast.  COMPARISON:  No skull fracture or intracranial hemorrhage.  Prominent small vessel disease type changes without CT evidence of large acute infarct.  Global atrophy without hydrocephalus.  No intracranial mass lesion noted on this unenhanced exam.  Ectatic basilar and vertebral arteries.  Calcified carotid arteries.  Hyperostosis frontalis interna incidentally noted.  Post orbital surgery.  FINDINGS: No skull fracture or intracranial hemorrhage.  Small vessel disease type changes without CT evidence of large acute infarct.  Atrophy without hydrocephalus.   Electronically Signed   By: Lacy DuverneySteven  Olson M.D.   On: 02/17/2015 12:01     EKG Interpretation None      MDM   Final diagnoses:  Fall, initial encounter     patient with fall on anticoagulation. Has a history of previous falls and GI bleeds. Due to recent note from PCP they intend on keeping the anticoagulation. Head CT done and did not show bleed. No new back pain. Discharge home.    Benjiman CoreNathan Kiing Deakin, MD 02/17/15 [phone]

## 2015-02-17 NOTE — ED Notes (Addendum)
Attempt made to contact POA, No answer x3. No walker,  [facility] contacted for transport

## 2015-02-17 NOTE — ED Notes (Signed)
Clint GuyRev. [name] [phone] is POA

## 2015-02-17 NOTE — ED Notes (Addendum)
Per EMS report: Pt a resident at [facility] on [address]  Was getting up from his chair to use his walker, slipped and fell and hit his head.  No LOC. No blood thinners.  Passed EMS spinal clearance.  VSS by EMS. CBG 144.  Has a small skin tear to LAFA/wrist.  Pt c/o pain to lower back that is not new from the fall.  AxOx4 but HOH.  Was reported that pt is supposed to use a wheelchair but was using the walker.

## 2015-02-17 NOTE — ED Notes (Signed)
Report called to Bridgett Larssonatavia Hayes, medical assistant [facility]

## 2015-02-17 NOTE — ED Notes (Signed)
Bed: [id number] Expected date:  Expected time:  Means of arrival:  Comments: EMS- 81yo, Fall, no complaint

## 2015-02-17 NOTE — Telephone Encounter (Signed)
Walk in pt form[facility] paper dropped off, will hold for Lauren to come back

## 2015-02-18 ENCOUNTER — Telehealth: Payer: Self-pay | Admitting: Cardiovascular Disease

## 2015-02-18 NOTE — Telephone Encounter (Signed)
New problem '  Need to know status of orders for pt that was fax for pt's to stay within medicare compliance. Please advise

## 2015-02-18 NOTE — Telephone Encounter (Signed)
Eugene [name] from [facility] is going to refax orders to be signed. Verified fax number with Eugene [name]. Informed Eugene [name] that a message would be sent to Dr. Earmon Phoenixooper's nurse Leotis ShamesLauren.

## 2015-02-22 ENCOUNTER — Encounter: Payer: Self-pay | Admitting: Cardiovascular Disease

## 2015-02-22 NOTE — Telephone Encounter (Signed)
Dr [name] signed orders on this pt last week.  I had medical records refax orders and confirmed that Eugene [name] did receive them by fax today.

## 2015-02-22 NOTE — Telephone Encounter (Signed)
This encounter was created in error - please disregard.

## 2015-02-22 NOTE — Telephone Encounter (Signed)
New Message   Burna MortimerWanda from [facility] following up on Home Health orders. Please call back and discuss.

## 2015-03-01 ENCOUNTER — Other Ambulatory Visit (INDEPENDENT_AMBULATORY_CARE_PROVIDER_SITE_OTHER): Payer: Medicare Other | Admitting: *Deleted

## 2015-03-01 ENCOUNTER — Ambulatory Visit: Payer: Medicare Other | Admitting: Cardiovascular Disease

## 2015-03-01 DIAGNOSIS — Z954 Presence of other heart-valve replacement: Secondary | ICD-10-CM | POA: Diagnosis not present

## 2015-03-01 DIAGNOSIS — I480 Paroxysmal atrial fibrillation: Secondary | ICD-10-CM

## 2015-03-01 DIAGNOSIS — Z952 Presence of prosthetic heart valve: Secondary | ICD-10-CM

## 2015-03-01 LAB — HEPATIC FUNCTION PANEL
ALBUMIN: 3.6 g/dL (ref 3.5–5.2)
ALT: 28 U/L (ref 0–53)
AST: 22 U/L (ref 0–37)
Alkaline Phosphatase: 80 U/L (ref 39–117)
BILIRUBIN TOTAL: 0.5 mg/dL (ref 0.2–1.2)
Bilirubin, Direct: 0.1 mg/dL (ref 0.0–0.3)
TOTAL PROTEIN: 6.4 g/dL (ref 6.0–8.3)

## 2015-03-01 LAB — BASIC METABOLIC PANEL
BUN: 19 mg/dL (ref 6–23)
CO2: 30 meq/L (ref 19–32)
CREATININE: 1.01 mg/dL (ref 0.40–1.50)
Calcium: 9.4 mg/dL (ref 8.4–10.5)
Chloride: 102 mEq/L (ref 96–112)
GFR: 73.98 mL/min (ref >60.00)
Glucose, Bld: 112 mg/dL — ABNORMAL HIGH (ref 70–99)
Potassium: 3.7 mEq/L (ref 3.5–5.1)
SODIUM: 138 meq/L (ref 135–145)

## 2015-03-01 LAB — TSH: TSH: 2.39 u[IU]/mL (ref 0.35–4.50)

## 2015-03-01 NOTE — Addendum Note (Signed)
Addended by: Tonita PhoenixBOWDEN, Cherissa Hook K on: 03/01/2015 02:19 PM   Modules accepted: Orders

## 2015-03-24 ENCOUNTER — Encounter: Payer: Medicare Other | Admitting: Internal Medicine

## 2015-04-07 ENCOUNTER — Encounter: Payer: Self-pay | Admitting: Internal Medicine

## 2015-04-07 ENCOUNTER — Encounter: Payer: Medicare Other | Admitting: Internal Medicine

## 2015-04-18 ENCOUNTER — Encounter: Payer: Self-pay | Admitting: *Deleted

## 2015-05-05 ENCOUNTER — Other Ambulatory Visit: Payer: Self-pay

## 2015-05-05 DIAGNOSIS — I350 Nonrheumatic aortic (valve) stenosis: Secondary | ICD-10-CM

## 2015-05-05 DIAGNOSIS — Z952 Presence of prosthetic heart valve: Secondary | ICD-10-CM

## 2015-05-12 ENCOUNTER — Encounter: Payer: Self-pay | Admitting: Family

## 2015-05-13 ENCOUNTER — Ambulatory Visit (INDEPENDENT_AMBULATORY_CARE_PROVIDER_SITE_OTHER): Payer: Medicare Other | Admitting: Family

## 2015-05-13 ENCOUNTER — Ambulatory Visit (HOSPITAL_COMMUNITY)
Admission: RE | Admit: 2015-05-13 | Discharge: 2015-05-13 | Disposition: A | Discharge status: Home / Self Care | Payer: Medicare Other | Source: Ambulatory Visit | Attending: Family | Admitting: Family

## 2015-05-13 ENCOUNTER — Encounter: Payer: Self-pay | Admitting: Family

## 2015-05-13 VITALS — BP 177/99 | HR 69 | Resp 16 | Ht 67.0 in | Wt 178.0 lb

## 2015-05-13 DIAGNOSIS — I739 Peripheral vascular disease, unspecified: Secondary | ICD-10-CM

## 2015-05-13 DIAGNOSIS — I779 Disorder of arteries and arterioles, unspecified: Secondary | ICD-10-CM | POA: Diagnosis not present

## 2015-05-13 DIAGNOSIS — Z9862 Peripheral vascular angioplasty status: Secondary | ICD-10-CM

## 2015-05-13 DIAGNOSIS — I10 Essential (primary) hypertension: Secondary | ICD-10-CM | POA: Diagnosis not present

## 2015-05-13 DIAGNOSIS — I872 Venous insufficiency (chronic) (peripheral): Secondary | ICD-10-CM

## 2015-05-13 DIAGNOSIS — I200 Unstable angina: Secondary | ICD-10-CM | POA: Diagnosis not present

## 2015-05-13 DIAGNOSIS — E785 Hyperlipidemia, unspecified: Secondary | ICD-10-CM | POA: Diagnosis not present

## 2015-05-13 DIAGNOSIS — Z87891 Personal history of nicotine dependence: Secondary | ICD-10-CM | POA: Insufficient documentation

## 2015-05-13 NOTE — Progress Notes (Signed)
Filed Vitals:   05/13/15 0917 05/13/15 0921  BP: 199/98 177/99  Pulse: 69 69  Resp: 16   Height:  (1.702 m)   Weight: 178 lb (80.74 kg)   SpO2: 97%

## 2015-05-13 NOTE — Progress Notes (Signed)
[facility] [place] HISTORY AND PHYSICAL -PAD  History of Present Illness Eugene [name] is a 81 y.o. male patient of Dr. Edilia Bo who originally presented with a nonhealing wound on his left great toe. He underwent an arteriogram was found to have a 95% stenosis of the proximal left anterior tibial artery which was successfully ballooned on 03/30/13. Follow up study showed an ABI of 100% on the left with a biphasic posterior tibial signal and a triphasic anterior tibial signal. The wound on the left toe had healed at that point. Dr. Edilia Bo saw pt on 11/11/13. At that time the patient had some very small wounds in both feet as described above but no cellulitis or drainage.He had biphasic Doppler signals in both feet.Dr. Edilia Bo indicated that he thought patient's swelling in the legs was likely related to his heart failure and he was scheduled to follow up with his cardiologist and primary care physician soon. Pt also had some history of chronic venous insufficiency and for this reason leg elevation would also be helpful. He returns today for evaluation of PAD and venous insufficiency.  Pt denies any history of stroke or TIA. He sees a podiatrist, Dr. Fernanda Drum, at the [facility]. His diabetic shoes rubbed the tops of both second toes and he developed ulcers of both of these toes; this is under treatment by his podiatrist, according to his friend with him. He is a resident of [facility] since Feb 09, 2015. He receives physical therapy 3x/week per friend.   He does not walk much, uses a walker, states he is unsteady on his feet. He does leg exercises daily while holding on to a counter. Pt denies pain in his legs with walking.   He was seen in [facility]  ED for elevated blood pressure, his medical providers are aware of his ongoing uncontrolled hypertension per friend. Dr. Tonny Bollman is his cardiologist. He had a heart valve replacement November 2015.   Since he has been elevating his legs as much as possible his lower legs have not been as swollen. He denies shortness of breath.  Pt Diabetic: No Pt smoker: former smoker, quit over 50 years ago  Pt meds include: Statin :Yes ASA: Yes Other anticoagulants/antiplatelets: Eliquis, possibly since heart valve replacement November 2016    Past Medical History  Diagnosis Date  . Glaucoma     Right eye  . Diverticulosis     multiple hospital admissions for GI bleed, has an episode roughly every 6 months,, last  colonscopy in 2006 showed diverticulosis, was evaluated by Dr. Madilyn Fireman in the hospital in 2011 for lower GI bleed andd it  was suggested that the patient was not actively bleeding at that time and given his comorbid conditions colonoscopy was deferred. He is also chronically constipated  . Macular degeneration of right eye   . Allergic rhinitis   . CAD (coronary artery disease)     a. h/o remote inferior MI, h/o PCI to RCA in 1991. b. syncope 2013: severe AS and high grade prox LAD and other diffuse disease. High risk for CABG - s/p BMS to prox LAD and balloon aortic valvuloplasty. c. cath 07/16/14 - diffuse CAD, managed medically.  . Hypertension   . Hyperlipidemia   . Chronic combined systolic and diastolic CHF (congestive heart failure)     a. Most recent echo 08/2014: EF 30-35%.  . Supraventricular tachycardia     2 syndromes-nonsustained atrial tachycardia//adenosine responsive diuretic positive reentry probably  AV node reentry  . Aortic stenosis     a. s/p AV balloon valvuloplasty by Dr. Excell Seltzer 11/2011. b. s/p TAVR 08/2014.  . Ischemic cardiomyopathy     a. 2D ECHO 11/2014: EF of 20-25%  . H/O: GI bleed   . Hypothyroidism   . GERD (gastroesophageal reflux disease)   . Falls frequently     3 times in the past week/notes 09/11/2013  . PAF (paroxysmal atrial fibrillation)     a. on amiodarone and eliquis   . Peripheral vascular disease     a. h/o ulcer on the left great toe s/p  angioplasty of the left anterior tibial artery.  . OSA on CPAP   . Limited mobility   . S/P TAVR (transcatheter aortic valve replacement)     a. 08/2014: 29 mm Edwards Sapien XT transcatheter heart valve placed via open left transfemoral approach  . PAT (paroxysmal atrial tachycardia)   . Atrial flutter     Social History History  Substance Use Topics  . Smoking status: Former Smoker -- 3.00 packs/day for 21 years    Types: Cigarettes    Quit date: 10/08/1958  . Smokeless tobacco: Never Used  . Alcohol Use: No    Family History Family History  Problem Relation Age of Onset  . Cancer Mother     Ovarian Cancer  . Cancer Father     Lung  . Heart disease Sister     Heart disease before age 10  . Stroke Sister   . Heart attack Neg Hx     Past Surgical History  Procedure Laterality Date  . Glaucoma surgery      Implantation of Baerveldt glaucoma device lant with scleral reinforcement using tutoplast tissue graft right eye. [Other]  . Coronary angioplasty with stent placement  Jan. 23, 2013  . Corneal transplant Right   . Angioplasty Left 03/30/2013    tibial  . Tonsillectomy  1946  . Cataract extraction w/ intraocular lens  implant, bilateral Bilateral   . Cardiac valve replacement  11/30/2011    aortic valve.?  . Transcatheter aortic valve replacement, transfemoral N/A 08/10/2014    Procedure: TRANSCATHETER AORTIC VALVE REPLACEMENT, TRANSFEMORAL;  Surgeon: Tonny Bollman, MD;  Location: [facility] OR;  Service: Open Heart Surgery;  Laterality: N/A;  . Tee without cardioversion N/A 08/10/2014    Procedure: TRANSESOPHAGEAL ECHOCARDIOGRAM (TEE);  Surgeon: Tonny Bollman, MD;  Location: [facility] OR;  Service: Open Heart Surgery;  Laterality: N/A;  . Coronary angiogram  11/02/2011    Procedure: CORONARY ANGIOGRAM;  Surgeon: Rollene Rotunda, MD;  Location: [facility] CATH LAB;  Service: Cardiovascular;;  . Left heart catheterization with coronary angiogram N/A 11/12/2011    Procedure: LEFT HEART  CATHETERIZATION WITH CORONARY ANGIOGRAM;  Surgeon: Tonny Bollman, MD;  Location: [facility] CATH LAB;  Service: Cardiovascular;  Laterality: N/A;  . Percutaneous coronary stent intervention (pci-s) N/A 11/14/2011    Procedure: PERCUTANEOUS CORONARY STENT INTERVENTION (PCI-S);  Surgeon: Tonny Bollman, MD;  Location: [facility] CATH LAB;  Service: Cardiovascular;  Laterality: N/A;  . Abdominal aortagram N/A 03/30/2013    Procedure: ABDOMINAL Ronny Flurry;  Surgeon: Chuck Hint, MD;  Location: [facility] CATH LAB;  Service: Cardiovascular;  Laterality: N/A;  . Left and right heart catheterization with coronary angiogram N/A 07/16/2014    Procedure: LEFT AND RIGHT HEART CATHETERIZATION WITH CORONARY ANGIOGRAM;  Surgeon: Micheline Chapman, MD;  Location: [facility] CATH LAB;  Service: Cardiovascular;  Laterality: N/A;    Allergies  Allergen Reactions  . Corn-Containing Products Other (  See Comments)    Trouble digesting  . Penicillins Swelling and Other (See Comments)    REACTION: "turns red"    Current Outpatient Prescriptions  Medication Sig Dispense Refill  . acetaminophen (TYLENOL) 500 MG tablet Take 1-2 tablets (500-1,000 mg total) by mouth every 6 (six) hours as needed for mild pain or moderate pain. (Patient taking differently: Take 1,000 mg by mouth every 6 (six) hours as needed for mild pain or moderate pain. )    . amiodarone (PACERONE) 200 MG tablet Take 1 tablet (200 mg total) by mouth 2 (two) times daily. 60 tablet 11  . apixaban (ELIQUIS) 2.5 MG TABS tablet Take 1 tablet (2.5 mg total) by mouth 2 (two) times daily. 60 tablet 11  . Calcium Carb-Cholecalciferol (CALCIUM 600+D) 600-800 MG-UNIT TABS Take 1 tablet by mouth daily.    Marland Kitchen docusate sodium 100 MG CAPS Take 100 mg by mouth 2 (two) times daily as needed for mild constipation. (Patient taking differently: Take 100 mg by mouth every 12 (twelve) hours as needed (for constipation). )    . ferrous sulfate 325 (65 FE) MG tablet Take 325 mg by mouth daily with  breakfast.    . furosemide (LASIX) 40 MG tablet Take 1 tablet (40 mg total) by mouth daily. 30 tablet 12  . levofloxacin (LEVAQUIN) 750 MG tablet Take 1 tablet by mouth every other day 7 tablet 2  . levothyroxine (SYNTHROID, LEVOTHROID) 25 MCG tablet TAKE 1 TABLET BY MOUTH EVERY DAY 30 tablet 3  . Multiple Vitamin (MULTIVITAMIN) tablet Take 1 tablet by mouth at bedtime.    . Multiple Vitamins-Minerals (PRESERVISION AREDS PO) Take 1 capsule by mouth 2 (two) times daily.    . nitroGLYCERIN (NITROSTAT) 0.4 MG SL tablet Place 1 tablet (0.4 mg total) under the tongue every 5 (five) minutes x 3 doses as needed for chest pain. 25 tablet 12  . oxybutynin (DITROPAN) 5 MG tablet Take 5 mg by mouth 2 (two) times daily.    . pantoprazole (PROTONIX) 40 MG tablet Take 40 mg by mouth daily.    . polyethylene glycol (MIRALAX / GLYCOLAX) packet Take 17 g by mouth daily as needed. (Patient taking differently: Take 17 g by mouth daily as needed for mild constipation. ) 14 each 0  . potassium chloride (K-DUR) 10 MEQ tablet Take 2 tablets (20 mEq total) by mouth daily. 60 tablet 11  . pravastatin (PRAVACHOL) 40 MG tablet Take 1 tablet (40 mg total) by mouth daily. 90 tablet 4  . tamsulosin (FLOMAX) 0.4 MG CAPS capsule Take 1 capsule (0.4 mg total) by mouth daily. 30 capsule 11  . terazosin (HYTRIN) 1 MG capsule TAKE ONE CAPSULE BY MOUTH AT BEDTIME (Patient taking differently: TAKE ONE CAPSULE BY MOUTH every morning) 30 capsule 2  . traMADol (ULTRAM) 50 MG tablet Take 0.5 tablets (25 mg total) by mouth every 6 (six) hours as needed. (Patient taking differently: Take 25 mg by mouth every 6 (six) hours as needed for moderate pain. ) 20 tablet 0  . Travoprost, BAK Free, (TRAVATAN) 0.004 % SOLN ophthalmic solution Place 1 drop into the right eye at bedtime.     No current facility-administered medications for this visit.    ROS: See HPI for pertinent positives and negatives.   Physical Examination  Filed Vitals:    05/13/15 0917 05/13/15 0921  BP: 199/98 177/99  Pulse: 69 69  Resp: 16   Height: 5\' 7"  (1.702 m)   Weight: 178 lb (80.74 kg)  SpO2: 97%    Body mass index is 27.87 kg/(m^2).   General: A&O x 3, WDWN, obese male. Gait: slow and deliberate, using walker Eyes: PERRLA. Pulmonary: CTAB but with little air movement detected, without wheezes , rales or rhonchi. Cardiac: regular Rhythm, no detected murmur.     Carotid Bruits Right Left   Negative Negative  Aorta is not palpable. Radial pulses: are 2+ palpable and =   VASCULAR EXAM: Extremities without ischemic changes  without Gangrene; with open bandaged wounds dorsal aspects both second toes. He does have chronic venous stasis changes in the skin of his lower legs with dry skin and evidence of healed ulcers. Right lower leg has trace pitting edema, left has 1+ pitting edema.     LE Pulses Right Left   FEMORAL not palpable (seated, obese) not palpable (seated, obese)    POPLITEAL not palpable  not palpable   POSTERIOR TIBIAL not palpable  not palpable    DORSALIS PEDIS  ANTERIOR TIBIAL 2+palpable  2+palpable    Abdomen: soft, NT, no palpable masses. Skin: see extremities. Musculoskeletal: age appropriate muscle wasting and atrophy. Neurologic: A&O X 3; Appropriate Affect; MOTOR FUNCTION: moving all extremities equally, motor strength 5/5 in upper extremities, 3/5 in lower extremities. Speech is fluent/normal. CN 2-12 grossly intact except he is hard of hearing with hearing aids in place          Non-Invasive Vascular Imaging: DATE: 05/13/2015 ABI: RIGHT: 1.18 (05/12/14, 1.08), Waveforms: triphasic, TBI: 1.01 (0.88);  LEFT: 1.09 (1.10), Waveforms: triphasic, TBI: 1.00 (0.77)   ASSESSMENT: Eugene [name] is a 81 y.o. male who is s/p left anterior tibial artery angioplasty/PTA on 03/30/13. His blood pressure is elevated today but according to pt's friend it has been labile for at least 8 years since he has known him in attempts to get it under control. ABI's and TBI's today remain normal. All waveforms are triphasic.  The abrasion wounds on the dorsal aspects of both second toes have dressings that remain intact. Normal arterial perfusion to toes bilaterally. Chronic venous stasis is improved.   PLAN:  Based on the patient's vascular studies and examination, pt will return to clinic in 1 year for ABI's. Pt and friend know to return sooner if needed.   Dr. Fernanda Drum is treating pt's healing ulcerations on both second toes that pt states were caused by abrasion from his shoes; states he may be getting some different shoes.  I discussed in depth with the patient the nature of atherosclerosis, and emphasized the importance of maximal medical management including strict control of blood pressure, blood glucose, and lipid levels, obtaining regular exercise, and continued cessation of smoking.  The patient is aware that without maximal medical management the underlying atherosclerotic disease process will progress, limiting the benefit of any interventions.  The patient was given information about PAD including signs, symptoms, treatment, what symptoms should prompt the patient to seek immediate medical care, and risk reduction measures to take.  Charisse March, RN, MSN, FNP-C [facility] [place] Phone: [phone]  Clinic MD: Imogene Burn  05/13/2015 8:51 AM

## 2015-06-27 ENCOUNTER — Emergency Department (HOSPITAL_COMMUNITY): Payer: Medicare Other

## 2015-06-27 ENCOUNTER — Emergency Department (HOSPITAL_COMMUNITY)
Admission: EM | Admit: 2015-06-27 | Discharge: 2015-06-27 | Disposition: A | Discharge status: Home / Self Care | Payer: Medicare Other | Attending: Emergency Medicine | Admitting: Emergency Medicine

## 2015-06-27 ENCOUNTER — Encounter (HOSPITAL_COMMUNITY): Payer: Self-pay

## 2015-06-27 DIAGNOSIS — E785 Hyperlipidemia, unspecified: Secondary | ICD-10-CM | POA: Insufficient documentation

## 2015-06-27 DIAGNOSIS — S0990XA Unspecified injury of head, initial encounter: Secondary | ICD-10-CM | POA: Insufficient documentation

## 2015-06-27 DIAGNOSIS — Z23 Encounter for immunization: Secondary | ICD-10-CM | POA: Insufficient documentation

## 2015-06-27 DIAGNOSIS — I2510 Atherosclerotic heart disease of native coronary artery without angina pectoris: Secondary | ICD-10-CM | POA: Insufficient documentation

## 2015-06-27 DIAGNOSIS — Z9981 Dependence on supplemental oxygen: Secondary | ICD-10-CM | POA: Insufficient documentation

## 2015-06-27 DIAGNOSIS — S92902B Unspecified fracture of left foot, initial encounter for open fracture: Secondary | ICD-10-CM

## 2015-06-27 DIAGNOSIS — Z79899 Other long term (current) drug therapy: Secondary | ICD-10-CM | POA: Insufficient documentation

## 2015-06-27 DIAGNOSIS — I10 Essential (primary) hypertension: Secondary | ICD-10-CM | POA: Insufficient documentation

## 2015-06-27 DIAGNOSIS — S199XXA Unspecified injury of neck, initial encounter: Secondary | ICD-10-CM | POA: Diagnosis not present

## 2015-06-27 DIAGNOSIS — H409 Unspecified glaucoma: Secondary | ICD-10-CM | POA: Insufficient documentation

## 2015-06-27 DIAGNOSIS — Y9389 Activity, other specified: Secondary | ICD-10-CM | POA: Insufficient documentation

## 2015-06-27 DIAGNOSIS — Z880 Allergy status to penicillin: Secondary | ICD-10-CM | POA: Insufficient documentation

## 2015-06-27 DIAGNOSIS — Y998 Other external cause status: Secondary | ICD-10-CM | POA: Diagnosis not present

## 2015-06-27 DIAGNOSIS — I5042 Chronic combined systolic (congestive) and diastolic (congestive) heart failure: Secondary | ICD-10-CM | POA: Diagnosis not present

## 2015-06-27 DIAGNOSIS — K219 Gastro-esophageal reflux disease without esophagitis: Secondary | ICD-10-CM | POA: Insufficient documentation

## 2015-06-27 DIAGNOSIS — S92515A Nondisplaced fracture of proximal phalanx of left lesser toe(s), initial encounter for closed fracture: Secondary | ICD-10-CM | POA: Insufficient documentation

## 2015-06-27 DIAGNOSIS — Y92031 Bathroom in apartment as the place of occurrence of the external cause: Secondary | ICD-10-CM | POA: Insufficient documentation

## 2015-06-27 DIAGNOSIS — Z87891 Personal history of nicotine dependence: Secondary | ICD-10-CM | POA: Diagnosis not present

## 2015-06-27 DIAGNOSIS — S99922A Unspecified injury of left foot, initial encounter: Secondary | ICD-10-CM | POA: Diagnosis present

## 2015-06-27 DIAGNOSIS — W1839XA Other fall on same level, initial encounter: Secondary | ICD-10-CM | POA: Diagnosis not present

## 2015-06-27 DIAGNOSIS — E039 Hypothyroidism, unspecified: Secondary | ICD-10-CM | POA: Insufficient documentation

## 2015-06-27 DIAGNOSIS — G4733 Obstructive sleep apnea (adult) (pediatric): Secondary | ICD-10-CM | POA: Diagnosis not present

## 2015-06-27 DIAGNOSIS — S0003XA Contusion of scalp, initial encounter: Secondary | ICD-10-CM | POA: Insufficient documentation

## 2015-06-27 LAB — CBC
HEMATOCRIT: 43.4 % (ref 39.0–52.0)
HEMOGLOBIN: 14 g/dL (ref 13.0–17.0)
MCH: 31.2 pg (ref 26.0–34.0)
MCHC: 32.3 g/dL (ref 30.0–36.0)
MCV: 96.7 fL (ref 78.0–100.0)
Platelets: 133 10*3/uL — ABNORMAL LOW (ref 150–400)
RBC: 4.49 MIL/uL (ref 4.22–5.81)
RDW: 14.4 % (ref 11.5–15.5)
WBC: 7.2 10*3/uL (ref 4.0–10.5)

## 2015-06-27 LAB — PROTIME-INR
INR: 1.2 (ref 0.00–1.49)
PROTHROMBIN TIME: 15.3 s — AB (ref 11.6–15.2)

## 2015-06-27 MED ORDER — TETANUS-DIPHTH-ACELL PERTUSSIS 5-2.5-18.5 LF-MCG/0.5 IM SUSP
0.5000 mL | Freq: Once | INTRAMUSCULAR | Status: AC
  Administered 2015-06-27: 0.5 mL via INTRAMUSCULAR
  Filled 2015-06-27: qty 0.5

## 2015-06-27 MED ORDER — CEFAZOLIN SODIUM 1-5 GM-% IV SOLN
1.0000 g | Freq: Once | INTRAVENOUS | Status: AC
  Administered 2015-06-27: 1 g via INTRAVENOUS
  Filled 2015-06-27: qty 50

## 2015-06-27 MED ORDER — ACETAMINOPHEN 325 MG PO TABS
650.0000 mg | ORAL_TABLET | Freq: Once | ORAL | Status: AC
  Administered 2015-06-27: 650 mg via ORAL
  Filled 2015-06-27: qty 2

## 2015-06-27 MED ORDER — CEPHALEXIN 500 MG PO CAPS: 500.0000 mg | ORAL_CAPSULE | Freq: Four times a day (QID) | ORAL | Status: DC

## 2015-06-27 NOTE — ED Notes (Signed)
Patient does not have left hearing aid upon arrival. Patient reports placing both in ears this morning. EMS searched truck with no luck.

## 2015-06-27 NOTE — ED Notes (Signed)
Patient taken by [facility] 

## 2015-06-27 NOTE — ED Provider Notes (Signed)
CSN: [id number]     Arrival date & time 06/27/15  0857 History   First MD Initiated Contact with Patient 06/27/15 [phone]     Chief Complaint  Patient presents with  . Fall     (Consider location/radiation/quality/duration/timing/severity/associated sxs/prior Treatment) HPI Comments: 81 y.o. Male with complicated history including CAD, hyperlipidemia, PVD, chronic anticoagulation presents following a fall. The patient walks with a walker and gets up multiple times a night to use the bathroom.  Around 12 AM last night he got up to use the bathroom and that when he was turning with the walker he missed grabbing on to it while turning and reports that he fell to the ground.  He attempted to press his life alert but apparently it did not work and he was found by staff about 1 hour later.  He denies loss of consciousness.  He said that EMS was called initially and he was helped back to bed but he felt well so opted not to be taken to the ER.  This morning he had increasing pain at the back of his head and soreness in the sides of his neck and so asked to be brought to the ER for evaluation.   Patient is a 81 y.o. male presenting with fall.  Fall Associated symptoms include headaches. Pertinent negatives include no chest pain, no abdominal pain and no shortness of breath.    Past Medical History  Diagnosis Date  . Glaucoma     Right eye  . Diverticulosis     multiple hospital admissions for GI bleed, has an episode roughly every 6 months,, last  colonscopy in 2006 showed diverticulosis, was evaluated by Dr. Madilyn Fireman in the hospital in 2011 for lower GI bleed andd it  was suggested that the patient was not actively bleeding at that time and given his comorbid conditions colonoscopy was deferred. He is also chronically constipated  . Macular degeneration of right eye   . Allergic rhinitis   . CAD (coronary artery disease)     a. h/o remote inferior MI, h/o PCI to RCA in 1991. b. syncope 2013: severe AS  and high grade prox LAD and other diffuse disease. High risk for CABG - s/p BMS to prox LAD and balloon aortic valvuloplasty. c. cath 07/16/14 - diffuse CAD, managed medically.  . Hypertension   . Hyperlipidemia   . Chronic combined systolic and diastolic CHF (congestive heart failure)     a. Most recent echo 08/2014: EF 30-35%.  . Supraventricular tachycardia     2 syndromes-nonsustained atrial tachycardia//adenosine responsive diuretic positive reentry probably AV node reentry  . Aortic stenosis     a. s/p AV balloon valvuloplasty by Dr. Excell Seltzer 11/2011. b. s/p TAVR 08/2014.  . Ischemic cardiomyopathy     a. 2D ECHO 11/2014: EF of 20-25%  . H/O: GI bleed   . Hypothyroidism   . GERD (gastroesophageal reflux disease)   . Falls frequently     3 times in the past week/notes 09/11/2013  . PAF (paroxysmal atrial fibrillation)     a. on amiodarone and eliquis   . Peripheral vascular disease     a. h/o ulcer on the left great toe s/p angioplasty of the left anterior tibial artery.  . OSA on CPAP   . Limited mobility   . S/P TAVR (transcatheter aortic valve replacement)     a. 08/2014: 29 mm Edwards Sapien XT transcatheter heart valve placed via open left transfemoral approach  . PAT (paroxysmal atrial  tachycardia)   . Atrial flutter    Past Surgical History  Procedure Laterality Date  . Glaucoma surgery      Implantation of Baerveldt glaucoma device lant with scleral reinforcement using tutoplast tissue graft right eye. [Other]  . Coronary angioplasty with stent placement  Jan. 23, 2013  . Corneal transplant Right   . Angioplasty Left 03/30/2013    tibial  . Tonsillectomy  1946  . Cataract extraction w/ intraocular lens  implant, bilateral Bilateral   . Cardiac valve replacement  11/30/2011    aortic valve.?  . Transcatheter aortic valve replacement, transfemoral N/A 08/10/2014    Procedure: TRANSCATHETER AORTIC VALVE REPLACEMENT, TRANSFEMORAL;  Surgeon: Tonny Bollman, MD;  Location: [facility]  OR;  Service: Open Heart Surgery;  Laterality: N/A;  . Tee without cardioversion N/A 08/10/2014    Procedure: TRANSESOPHAGEAL ECHOCARDIOGRAM (TEE);  Surgeon: Tonny Bollman, MD;  Location: [facility] OR;  Service: Open Heart Surgery;  Laterality: N/A;  . Coronary angiogram  11/02/2011    Procedure: CORONARY ANGIOGRAM;  Surgeon: Rollene Rotunda, MD;  Location: [facility] CATH LAB;  Service: Cardiovascular;;  . Left heart catheterization with coronary angiogram N/A 11/12/2011    Procedure: LEFT HEART CATHETERIZATION WITH CORONARY ANGIOGRAM;  Surgeon: Tonny Bollman, MD;  Location: [facility] CATH LAB;  Service: Cardiovascular;  Laterality: N/A;  . Percutaneous coronary stent intervention (pci-s) N/A 11/14/2011    Procedure: PERCUTANEOUS CORONARY STENT INTERVENTION (PCI-S);  Surgeon: Tonny Bollman, MD;  Location: [facility] CATH LAB;  Service: Cardiovascular;  Laterality: N/A;  . Abdominal aortagram N/A 03/30/2013    Procedure: ABDOMINAL Ronny Flurry;  Surgeon: Chuck Hint, MD;  Location: [facility] CATH LAB;  Service: Cardiovascular;  Laterality: N/A;  . Left and right heart catheterization with coronary angiogram N/A 07/16/2014    Procedure: LEFT AND RIGHT HEART CATHETERIZATION WITH CORONARY ANGIOGRAM;  Surgeon: Micheline Chapman, MD;  Location: [facility] CATH LAB;  Service: Cardiovascular;  Laterality: N/A;   Family History  Problem Relation Age of Onset  . Cancer Mother     Ovarian Cancer  . Cancer Father     Lung  . Heart disease Sister     Heart disease before age 39  . Stroke Sister   . Heart attack Neg Hx    Social History  Substance Use Topics  . Smoking status: Former Smoker -- 3.00 packs/day for 21 years    Types: Cigarettes    Quit date: 10/08/1958  . Smokeless tobacco: Never Used  . Alcohol Use: No    Review of Systems  Constitutional: Negative for fever, chills, appetite change and fatigue.  HENT: Negative for congestion and nosebleeds.   Eyes: Negative for pain and visual disturbance.  Respiratory: Negative for  cough, chest tightness and shortness of breath.   Cardiovascular: Negative for chest pain and palpitations.  Gastrointestinal: Negative for nausea, vomiting, abdominal pain and diarrhea.  Genitourinary: Positive for frequency (unchanged). Negative for dysuria and urgency.  Musculoskeletal: Positive for neck pain. Negative for myalgias and back pain.  Skin: Negative for rash.       Bruise on left posterior scalp  Neurological: Positive for headaches. Negative for dizziness, facial asymmetry, speech difficulty, weakness, light-headedness and numbness.  Hematological: Bruises/bleeds easily.      Allergies  Corn-containing products and Penicillins  Home Medications   Prior to Admission medications   Medication Sig Start Date End Date Taking? Authorizing Provider  apixaban (ELIQUIS) 2.5 MG TABS tablet Take 1 tablet (2.5 mg total) by mouth 2 (two) times daily. 12/02/14  Yes  Janetta Hora, PA-C  Calcium Carb-Cholecalciferol (CALCIUM 600+D) 600-800 MG-UNIT TABS Take 1 tablet by mouth daily.   Yes Historical Provider, MD  carboxymethylcellulose (REFRESH) 1 % ophthalmic solution Place 4 drops into both eyes 4 (four) times daily.   Yes Historical Provider, MD  furosemide (LASIX) 40 MG tablet Take 1 tablet (40 mg total) by mouth daily. 12/02/14  Yes Janetta Hora, PA-C  Multiple Vitamin (MULTIVITAMIN) tablet Take 1 tablet by mouth at bedtime.   Yes Historical Provider, MD  Multiple Vitamins-Minerals (PRESERVISION AREDS PO) Take 1 capsule by mouth 2 (two) times daily.   Yes Historical Provider, MD  omeprazole (PRILOSEC) 20 MG capsule Take 20 mg by mouth daily.   Yes Historical Provider, MD  oxybutynin (DITROPAN-XL) 10 MG 24 hr tablet Take 5 mg by mouth every morning.   Yes Historical Provider, MD  potassium chloride (K-DUR) 10 MEQ tablet Take 2 tablets (20 mEq total) by mouth daily. 12/02/14  Yes Janetta Hora, PA-C  pravastatin (PRAVACHOL) 40 MG tablet Take 1 tablet (40 mg total) by  mouth daily. 06/17/14  Yes Earl Lagos, MD  Skin Protectants, Misc. (BAZA PROTECT EX) Apply 1 application topically 3 (three) times daily.   Yes Historical Provider, MD  tamsulosin (FLOMAX) 0.4 MG CAPS capsule Take 1 capsule (0.4 mg total) by mouth daily. 12/02/14  Yes Janetta Hora, PA-C  terazosin (HYTRIN) 1 MG capsule TAKE ONE CAPSULE BY MOUTH AT BEDTIME Patient taking differently: TAKE ONE CAPSULE BY MOUTH every morning 12/28/14  Yes Nischal Narendra, MD  Travoprost, BAK Free, (TRAVATAN) 0.004 % SOLN ophthalmic solution Place 1 drop into the right eye at bedtime.   Yes Historical Provider, MD  acetaminophen (TYLENOL) 500 MG tablet Take 1-2 tablets (500-1,000 mg total) by mouth every 6 (six) hours as needed for mild pain or moderate pain. Patient taking differently: Take 1,000 mg by mouth every 6 (six) hours as needed for mild pain or moderate pain.  08/13/14   Dayna N Dunn, PA-C  amiodarone (PACERONE) 200 MG tablet Take 1 tablet (200 mg total) by mouth 2 (two) times daily. 12/02/14   Janetta Hora, PA-C  cephALEXin (KEFLEX) 500 MG capsule Take 1 capsule (500 mg total) by mouth 4 (four) times daily. 06/27/15   Leta Baptist, MD  docusate sodium 100 MG CAPS Take 100 mg by mouth 2 (two) times daily as needed for mild constipation. Patient taking differently: Take 100 mg by mouth every 12 (twelve) hours as needed (for constipation).  08/13/14   Dayna N Dunn, PA-C  ferrous sulfate 325 (65 FE) MG tablet Take 325 mg by mouth daily with breakfast.    Historical Provider, MD  levothyroxine (SYNTHROID, LEVOTHROID) 25 MCG tablet TAKE 1 TABLET BY MOUTH EVERY DAY Patient not taking: Reported on 06/27/2015 02/09/15   Earl Lagos, MD  nitroGLYCERIN (NITROSTAT) 0.4 MG SL tablet Place 1 tablet (0.4 mg total) under the tongue every 5 (five) minutes x 3 doses as needed for chest pain. 12/02/14   Janetta Hora, PA-C  oxybutynin (DITROPAN) 5 MG tablet Take 5 mg by mouth 2 (two) times daily.     Historical Provider, MD  pantoprazole (PROTONIX) 40 MG tablet Take 40 mg by mouth daily.    Historical Provider, MD  polyethylene glycol (MIRALAX / GLYCOLAX) packet Take 17 g by mouth daily as needed. Patient taking differently: Take 17 g by mouth 3 (three) times a week. Monday, Wednesday, Friday 01/06/15   Earl Lagos, MD  traMADol (  ULTRAM) 50 MG tablet Take 0.5 tablets (25 mg total) by mouth every 6 (six) hours as needed. Patient not taking: Reported on 06/27/2015 11/15/14 11/15/15  Beather Arbour, MD   BP 139/63 mmHg  Pulse 56  Temp(Src) 98.9 F (37.2 C) (Oral)  Resp 17  SpO2 91% Physical Exam  Constitutional: He is oriented to person, place, and time. No distress.  HENT:  Head: Normocephalic. Head is with contusion (at the site marked in blue on the diagram). Head is without raccoon's eyes, without Battle's sign, without abrasion and without laceration.    Right Ear: External ear normal.  Left Ear: External ear normal.  Nose: Nose normal.  Mouth/Throat: Oropharynx is clear and moist. No oropharyngeal exudate.  Neck: Normal range of motion. Muscular tenderness (mostly along the left SCM) present. No spinous process tenderness present. Normal range of motion present.  Cardiovascular: Normal rate and intact distal pulses.   Pulmonary/Chest: Effort normal. No respiratory distress. He has no wheezes. He has no rales. He exhibits tenderness. He exhibits no edema.    Abdominal: Soft. He exhibits no distension. There is no tenderness.  Musculoskeletal: Normal range of motion. He exhibits no edema or tenderness.  Neurological: He is alert and oriented to person, place, and time. He has normal strength. No cranial nerve deficit. He exhibits normal muscle tone. Coordination normal.  Reports chronic mildly decreased sensation in the toes/feet although sensation present  Skin: Skin is warm and dry. Laceration (wound over the second digit of the left foot with swelling of the toe) noted. He is  not diaphoretic.  Vitals reviewed.   ED Course  Procedures (including critical care time) Labs Review Labs Reviewed  CBC - Abnormal; Notable for the following:    Platelets 133 (*)    All other components within normal limits  PROTIME-INR - Abnormal; Notable for the following:    Prothrombin Time 15.3 (*)    All other components within normal limits    Imaging Review Dg Chest 2 View  06/27/2015   CLINICAL DATA:  81 year old who fell in his room at the skilled nursing facility last night while walking to the bathroom. Right-sided chest pain. Initial encounter.  EXAM: CHEST  2 VIEW  COMPARISON:  11/16/2014 and earlier, including CTA chest 10/21/2012.  FINDINGS: Sitting AP erect and lateral images were obtained. Cardiac silhouette moderately enlarged, unchanged. Prior TAVR procedure. Thoracic aorta atherosclerotic, unchanged. Suboptimal inspiration with mild atelectasis in the lower lobes. Lungs otherwise clear. No localized airspace consolidation. No pleural effusions. No pneumothorax. Normal pulmonary vascularity. Old healed fracture involving the proximal right humerus. Degenerative changes in both shoulders. No visible acute fractures. Degenerative changes involving the thoracic spine.  IMPRESSION: 1. Stable moderate cardiomegaly. Suboptimal inspiration accounts for mild atelectasis in the lower lobes. No acute cardiopulmonary disease otherwise. 2. No visible acute fractures involving the bony thorax. However, if there is focal tenderness involving the right ribs, dedicated right rib x-rays may be helpful.   Electronically Signed   By: Hulan Saas M.D.   On: 06/27/2015 11:18   Ct Head Wo Contrast  06/27/2015   CLINICAL DATA:  Mid occipital pain and neck stiffness after a fall while walking to the bathroom last night.  EXAM: CT HEAD WITHOUT CONTRAST  CT CERVICAL SPINE WITHOUT CONTRAST  TECHNIQUE: Multidetector CT imaging of the head and cervical spine was performed following the standard  protocol without intravenous contrast. Multiplanar CT image reconstructions of the cervical spine were also generated.  COMPARISON:  Multiple  exams, including 02/17/2015 and 04/07/2014  FINDINGS: CT HEAD FINDINGS  Small hypodense lacunar infarcts in the thalami, left lentiform nucleus, and head of the left caudate nucleus, stable.  Periventricular white matter and corona radiata hypodensities favor chronic ischemic microvascular white matter disease. Remote right frontal lobe infarct on images 24-25 series 3, chronic and stable.  Stably mildly ectatic basilar artery  Left parietal scalp soft tissue swelling. Postoperative findings, right globe.  CT CERVICAL SPINE FINDINGS  The upper margin of C1 and the craniocervical junction are excluded on the dedicated CT images but I believe this same region is relatively well seen on the head CT images and intact.  There is pannus posterior to the odontoid. 1.5 mm degenerative anterolisthesis at C3-4, C4-5, and C5-6 with extensive intervertebral spurring and multilevel uncinate and facet spurring causing osseous foraminal stenosis on the right at C3-4 and C4-5 ; and on the left at C5-6. Loss of disc height at C5-6 and C6-7.  IMPRESSION: 1. Left parietal scalp soft tissue swelling. 2. No acute intracranial findings. Periventricular white matter and corona radiata hypodensities favor chronic ischemic microvascular white matter disease. Several small remote lacunar infarcts. 3. Cervical spondylosis causing osseous foraminal narrowing at several levels; no acute cervical spine findings.   Electronically Signed   By: Gaylyn Rong M.D.   On: 06/27/2015 10:50   Ct Cervical Spine Wo Contrast  06/27/2015   CLINICAL DATA:  Mid occipital pain and neck stiffness after a fall while walking to the bathroom last night.  EXAM: CT HEAD WITHOUT CONTRAST  CT CERVICAL SPINE WITHOUT CONTRAST  TECHNIQUE: Multidetector CT imaging of the head and cervical spine was performed following the  standard protocol without intravenous contrast. Multiplanar CT image reconstructions of the cervical spine were also generated.  COMPARISON:  Multiple exams, including 02/17/2015 and 04/07/2014  FINDINGS: CT HEAD FINDINGS  Small hypodense lacunar infarcts in the thalami, left lentiform nucleus, and head of the left caudate nucleus, stable.  Periventricular white matter and corona radiata hypodensities favor chronic ischemic microvascular white matter disease. Remote right frontal lobe infarct on images 24-25 series 3, chronic and stable.  Stably mildly ectatic basilar artery  Left parietal scalp soft tissue swelling. Postoperative findings, right globe.  CT CERVICAL SPINE FINDINGS  The upper margin of C1 and the craniocervical junction are excluded on the dedicated CT images but I believe this same region is relatively well seen on the head CT images and intact.  There is pannus posterior to the odontoid. 1.5 mm degenerative anterolisthesis at C3-4, C4-5, and C5-6 with extensive intervertebral spurring and multilevel uncinate and facet spurring causing osseous foraminal stenosis on the right at C3-4 and C4-5 ; and on the left at C5-6. Loss of disc height at C5-6 and C6-7.  IMPRESSION: 1. Left parietal scalp soft tissue swelling. 2. No acute intracranial findings. Periventricular white matter and corona radiata hypodensities favor chronic ischemic microvascular white matter disease. Several small remote lacunar infarcts. 3. Cervical spondylosis causing osseous foraminal narrowing at several levels; no acute cervical spine findings.   Electronically Signed   By: Gaylyn Rong M.D.   On: 06/27/2015 10:50   Dg Foot Complete Left  06/27/2015   CLINICAL DATA:  Fall. Left second toe swelling and abrasion, as well as chronic soft tissue ulcer.  EXAM: LEFT FOOT - COMPLETE 3+ VIEW  COMPARISON:  None.  FINDINGS: Nondisplaced fracture is seen involving the base of the proximal phalanx of the second toe. No other  fractures identified. No  evidence of dislocation. No other significant bone abnormality identified. Peripheral vascular calcification noted.  IMPRESSION: Nondisplaced fracture of proximal phalanx of second toe.   Electronically Signed   By: Myles Rosenthal M.D.   On: 06/27/2015 11:15   I have personally reviewed and evaluated these images and lab results as part of my medical decision-making.   EKG Interpretation None      MDM  Patient was seen and evaluated in stable condition.  Mechanical fall about 9 hours prior to presentation.  CBC and PT/INR stable.  CT head and cervical spine without acute traumatic process other than posterior scalp hematoma.  Xray of the chest negative for acute process - no displaced rib fracture seen.  Xray left foot revealed nondisplaced fracture of proximal phalanx of second toe.  Patient treated with Kefzol in the ED and placed in post op shoe.  Patient was provided an incentive spirometer in light of rib pain.  He was also given a script for Keflex for home.  Results and clinical impression were discussed with patient who expressed understanding and agreement with plan of care.  Patient was discharged home in stable condition with instruction to follow up with his PCP and ortho outpatient.  All questions were answered prior to discharge. Final diagnoses:  Open fracture of foot, left, initial encounter    1. Head injury  2. Rib contusion  3. Open fracture of second toe left foot    Leta Baptist, MD 06/27/15 2227

## 2015-06-27 NOTE — ED Notes (Signed)
Patient returned from CT

## 2015-06-27 NOTE — ED Notes (Signed)
Attempted to give report to [facility] x3. Spoke with someone from [place] [facility]. Stated they would give RN message to call this RN.

## 2015-06-27 NOTE — ED Notes (Signed)
MD at the bedside  

## 2015-06-27 NOTE — Discharge Instructions (Signed)
Toe Fracture Your fracture is considered open as there is a wound overlying it.  This means you need to take antibiotics as prescribed.  Follow up with orthopedics for the fracture as directed. Your caregiver has diagnosed you as having a fractured toe. A toe fracture is a break in the bone of a toe. "Buddy taping" is a way of splinting your broken toe, by taping the broken toe to the toe next to it. This "buddy taping" will keep the injured toe from moving beyond normal range of motion. Buddy taping also helps the toe heal in a more normal alignment. It may take 6 to 8 weeks for the toe injury to heal. HOME CARE INSTRUCTIONS   Leave your toes taped together for as long as directed by your caregiver or until you see a doctor for a follow-up examination. You can change the tape after bathing. Always use a small piece of gauze or cotton between the toes when taping them together. This will help the skin stay dry and prevent infection.  Apply ice to the injury for 15-20 minutes each hour while awake for the first 2 days. Put the ice in a plastic bag and place a towel between the bag of ice and your skin.  After the first 2 days, apply heat to the injured area. Use heat for the next 2 to 3 days. Place a heating pad on the foot or soak the foot in warm water as directed by your caregiver.  Keep your foot elevated as much as possible to lessen swelling.  Wear sturdy, supportive shoes. The shoes should not pinch the toes or fit tightly against the toes.  Your caregiver may prescribe a rigid shoe if your foot is very swollen.  Your may be given crutches if the pain is too great and it hurts too much to walk.  Only take over-the-counter or prescription medicines for pain, discomfort, or fever as directed by your caregiver.  If your caregiver has given you a follow-up appointment, it is very important to keep that appointment. Not keeping the appointment could result in a chronic or permanent injury,  pain, and disability. If there is any problem keeping the appointment, you must call back to this facility for assistance. SEEK MEDICAL CARE IF:   You have increased pain or swelling, not relieved with medications.  The pain does not get better after 1 week.  Your injured toe is cold when the others are warm. SEEK IMMEDIATE MEDICAL CARE IF:   The toe becomes cold, numb, or white.  The toe becomes hot (inflamed) and red. Document Released: 09/21/2000 Document Revised: 12/17/2011 Document Reviewed: 05/10/2008 [facility] Patient Information 2015 [place], [place]. This information is not intended to replace advice given to you by your health care provider. Make sure you discuss any questions you have with your health care provider.

## 2015-06-27 NOTE — ED Notes (Signed)
Per EMS, Patient fell last night in his room at around 1200. Patient was found by RN at 0130. Patient denies any LOC. Patient reports a mechanical fall while trying to walk with walker to the bathroom. Patient refused transport and denied pain last night. Facility called EMS this morning because patient reports head pain and neck pain. Hx of blood thinner. Patient is Alert and Oriented x4 upon arrival.

## 2015-06-27 NOTE — ED Notes (Signed)
Patient returned from CT and Xray

## 2015-07-06 ENCOUNTER — Other Ambulatory Visit: Payer: Self-pay

## 2015-07-06 ENCOUNTER — Encounter: Payer: Self-pay | Admitting: Cardiovascular Disease

## 2015-07-06 ENCOUNTER — Ambulatory Visit (HOSPITAL_COMMUNITY): Payer: Medicare Other | Attending: Cardiology

## 2015-07-06 ENCOUNTER — Ambulatory Visit (INDEPENDENT_AMBULATORY_CARE_PROVIDER_SITE_OTHER): Payer: Medicare Other | Admitting: Cardiovascular Disease

## 2015-07-06 VITALS — BP 130/94 | HR 68 | Ht 67.0 in | Wt 202.4 lb

## 2015-07-06 DIAGNOSIS — Z8249 Family history of ischemic heart disease and other diseases of the circulatory system: Secondary | ICD-10-CM | POA: Diagnosis not present

## 2015-07-06 DIAGNOSIS — I4892 Unspecified atrial flutter: Secondary | ICD-10-CM | POA: Diagnosis not present

## 2015-07-06 DIAGNOSIS — I350 Nonrheumatic aortic (valve) stenosis: Secondary | ICD-10-CM | POA: Insufficient documentation

## 2015-07-06 DIAGNOSIS — Z954 Presence of other heart-valve replacement: Secondary | ICD-10-CM | POA: Diagnosis not present

## 2015-07-06 DIAGNOSIS — I200 Unstable angina: Secondary | ICD-10-CM | POA: Diagnosis not present

## 2015-07-06 DIAGNOSIS — I517 Cardiomegaly: Secondary | ICD-10-CM | POA: Insufficient documentation

## 2015-07-06 DIAGNOSIS — I10 Essential (primary) hypertension: Secondary | ICD-10-CM | POA: Insufficient documentation

## 2015-07-06 DIAGNOSIS — I25118 Atherosclerotic heart disease of native coronary artery with other forms of angina pectoris: Secondary | ICD-10-CM

## 2015-07-06 DIAGNOSIS — Z952 Presence of prosthetic heart valve: Secondary | ICD-10-CM | POA: Insufficient documentation

## 2015-07-06 DIAGNOSIS — Z87891 Personal history of nicotine dependence: Secondary | ICD-10-CM | POA: Diagnosis not present

## 2015-07-06 DIAGNOSIS — E039 Hypothyroidism, unspecified: Secondary | ICD-10-CM

## 2015-07-06 DIAGNOSIS — E785 Hyperlipidemia, unspecified: Secondary | ICD-10-CM | POA: Insufficient documentation

## 2015-07-06 DIAGNOSIS — I340 Nonrheumatic mitral (valve) insufficiency: Secondary | ICD-10-CM | POA: Diagnosis not present

## 2015-07-06 NOTE — Patient Instructions (Addendum)
Medication Instructions:  None  Labwork: CMET, CBC, TSH at the time of your next appt  Testing/Procedures: None  Follow-Up: Your physician wants you to follow-up in: 6 months with Dr. Excell Seltzer. You will receive a reminder letter in the mail two months in advance. If you don't receive a letter, please call our office to schedule the follow-up appointment.   Any Other Special Instructions Will Be Listed Below (If Applicable).

## 2015-07-06 NOTE — Progress Notes (Signed)
Cardiology Office Note Date:  07/06/2015   ID:  Eugene [name], DOB [birth year], MRN [id number]  PCP:  Reymundo Poll, MD  Cardiologist:  Sherren Mocha, MD    Chief Complaint  Patient presents with  . Follow-up    aortic valve disease/CAD     History of Present Illness: Eugene [name] is a 81 y.o. male who presents for follow-up of aortic valve disease, chronic diastolic heart failure, and CAD. His coronary artery disease dates back to the early 1990s when he presented with an inferior wall MI treated with balloon angioplasty of the right coronary artery. He underwent PCI of severe stenosis in the proximal LAD in 2013 after presenting with syncope and elevated cardiac enzymes. He was noted to have severe aortic stenosis at that time and underwent balloon aortic valvuloplasty. He also has peripheral arterial disease and has had nonhealing ulcerations. He underwent angioplasty of the left tibial artery in 2014. His severe aortic stenosis was ultimately treated with TAVR in November 2015 via a transfemoral approach. He's been treated with aspirin alone because of gastrointestinal bleeding on Plavix. However, he was hospitalized again with atrial fibrillation/flutter in February. He was started on Eliquis during that hospitalization. The patient presents today for follow-up evaluation.  The patient is here today for his 1 year TAVR follow-up visit. An echocardiogram was completed just prior to this visit. From a cardiac perspective he is doing okay. He denies any chest pain, chest pressure, shortness of breath, or heart palpitations. He apparently had some episodes of chest discomfort last month and was given sublingual nitroglycerin, but the MAR from his assisted living facility does not show any sublingual nitroglycerin administration this month. The patient does not recall having chest pain recently. He denies bleeding problems. He has had issues with bowel and bladder incontinence. He continues to  have to problems with gait instability and occasional falls.   Past Medical History  Diagnosis Date  . Glaucoma     Right eye  . Diverticulosis     multiple hospital admissions for GI bleed, has an episode roughly every 6 months,, last  colonscopy in 2006 showed diverticulosis, was evaluated by Dr. Amedeo Plenty in the hospital in 2011 for lower GI bleed andd it  was suggested that the patient was not actively bleeding at that time and given his comorbid conditions colonoscopy was deferred. He is also chronically constipated  . Macular degeneration of right eye   . Allergic rhinitis   . CAD (coronary artery disease)     a. h/o remote inferior MI, h/o PCI to RCA in 1991. b. syncope 2013: severe AS and high grade prox LAD and other diffuse disease. High risk for CABG - s/p BMS to prox LAD and balloon aortic valvuloplasty. c. cath 07/16/14 - diffuse CAD, managed medically.  . Hypertension   . Hyperlipidemia   . Chronic combined systolic and diastolic CHF (congestive heart failure)     a. Most recent echo 08/2014: EF 30-35%.  . Supraventricular tachycardia     2 syndromes-nonsustained atrial tachycardia//adenosine responsive diuretic positive reentry probably AV node reentry  . Aortic stenosis     a. s/p AV balloon valvuloplasty by Dr. Burt Knack 11/2011. b. s/p TAVR 08/2014.  . Ischemic cardiomyopathy     a. 2D ECHO 11/2014: EF of 20-25%  . H/O: GI bleed   . Hypothyroidism   . GERD (gastroesophageal reflux disease)   . Falls frequently     3 times in the past week/notes 09/11/2013  .  PAF (paroxysmal atrial fibrillation)     a. on amiodarone and eliquis   . Peripheral vascular disease     a. h/o ulcer on the left great toe s/p angioplasty of the left anterior tibial artery.  . OSA on CPAP   . Limited mobility   . S/P TAVR (transcatheter aortic valve replacement)     a. 08/2014: 29 mm Edwards Sapien XT transcatheter heart valve placed via open left transfemoral approach  . PAT (paroxysmal atrial  tachycardia)   . Atrial flutter     Past Surgical History  Procedure Laterality Date  . Glaucoma surgery      Implantation of Baerveldt glaucoma device lant with scleral reinforcement using tutoplast tissue graft right eye. [Other]  . Coronary angioplasty with stent placement  Jan. 23, 2013  . Corneal transplant Right   . Angioplasty Left 03/30/2013    tibial  . Tonsillectomy  1946  . Cataract extraction w/ intraocular lens  implant, bilateral Bilateral   . Cardiac valve replacement  11/30/2011    aortic valve.?  . Transcatheter aortic valve replacement, transfemoral N/A 08/10/2014    Procedure: TRANSCATHETER AORTIC VALVE REPLACEMENT, TRANSFEMORAL;  Surgeon: Sherren Mocha, MD;  Location: [place];  Service: Open Heart Surgery;  Laterality: N/A;  . Tee without cardioversion N/A 08/10/2014    Procedure: TRANSESOPHAGEAL ECHOCARDIOGRAM (TEE);  Surgeon: Sherren Mocha, MD;  Location: [place];  Service: Open Heart Surgery;  Laterality: N/A;  . Coronary angiogram  11/02/2011    Procedure: CORONARY ANGIOGRAM;  Surgeon: Minus Breeding, MD;  Location: [facility] CATH LAB;  Service: Cardiovascular;;  . Left heart catheterization with coronary angiogram N/A 11/12/2011    Procedure: LEFT HEART CATHETERIZATION WITH CORONARY ANGIOGRAM;  Surgeon: Sherren Mocha, MD;  Location: [facility] CATH LAB;  Service: Cardiovascular;  Laterality: N/A;  . Percutaneous coronary stent intervention (pci-s) N/A 11/14/2011    Procedure: PERCUTANEOUS CORONARY STENT INTERVENTION (PCI-S);  Surgeon: Sherren Mocha, MD;  Location: [facility] CATH LAB;  Service: Cardiovascular;  Laterality: N/A;  . Abdominal aortagram N/A 03/30/2013    Procedure: ABDOMINAL Maxcine Ham;  Surgeon: Angelia Mould, MD;  Location: [facility] CATH LAB;  Service: Cardiovascular;  Laterality: N/A;  . Left and right heart catheterization with coronary angiogram N/A 07/16/2014    Procedure: LEFT AND RIGHT HEART CATHETERIZATION WITH CORONARY ANGIOGRAM;  Surgeon: Blane Ohara, MD;  Location:  [facility] CATH LAB;  Service: Cardiovascular;  Laterality: N/A;    Current Outpatient Prescriptions  Medication Sig Dispense Refill  . acetaminophen (TYLENOL) 500 MG tablet Take 1,000 mg by mouth every 6 (six) hours as needed (pain).    Marland Kitchen amiodarone (PACERONE) 200 MG tablet Take 1 tablet (200 mg total) by mouth 2 (two) times daily. 60 tablet 11  . apixaban (ELIQUIS) 2.5 MG TABS tablet Take 1 tablet (2.5 mg total) by mouth 2 (two) times daily. 60 tablet 11  . Calcium Carb-Cholecalciferol (CALCIUM 600+D) 600-800 MG-UNIT TABS Take 1 tablet by mouth daily.    . carboxymethylcellulose (REFRESH) 1 % ophthalmic solution Place 4 drops into both eyes 4 (four) times daily.    . cephALEXin (KEFLEX) 500 MG capsule Take 1 capsule (500 mg total) by mouth 4 (four) times daily. 28 capsule 0  . docusate sodium (COLACE) 100 MG capsule Take 100 mg by mouth 2 (two) times daily as needed for mild constipation (constipation).    . ferrous sulfate 325 (65 FE) MG tablet Take 325 mg by mouth daily with breakfast.    . furosemide (LASIX) 40 MG tablet  Take 1 tablet (40 mg total) by mouth daily. 30 tablet 12  . levothyroxine (SYNTHROID, LEVOTHROID) 25 MCG tablet TAKE 1 TABLET BY MOUTH EVERY DAY 30 tablet 3  . Multiple Vitamin (MULTIVITAMIN) tablet Take 1 tablet by mouth at bedtime.    . Multiple Vitamins-Minerals (PRESERVISION AREDS PO) Take 1 capsule by mouth 2 (two) times daily.    . nitroGLYCERIN (NITROSTAT) 0.4 MG SL tablet Place 1 tablet (0.4 mg total) under the tongue every 5 (five) minutes x 3 doses as needed for chest pain. 25 tablet 12  . omeprazole (PRILOSEC) 20 MG capsule Take 20 mg by mouth daily.    Marland Kitchen oxybutynin (DITROPAN) 5 MG tablet Take 5 mg by mouth 2 (two) times daily.    . pantoprazole (PROTONIX) 40 MG tablet Take 40 mg by mouth daily.    . polyethylene glycol (MIRALAX / GLYCOLAX) packet Take 17 g by mouth as needed. Take one packet mixed with liquid by mouth on Monday, Wednesday, and Friday    . potassium  chloride (K-DUR) 10 MEQ tablet Take 2 tablets (20 mEq total) by mouth daily. 60 tablet 11  . pravastatin (PRAVACHOL) 40 MG tablet Take 1 tablet (40 mg total) by mouth daily. 90 tablet 4  . Skin Protectants, Misc. (BAZA PROTECT EX) Apply 1 application topically 3 (three) times daily.    . tamsulosin (FLOMAX) 0.4 MG CAPS capsule Take 1 capsule (0.4 mg total) by mouth daily. 30 capsule 11  . terazosin (HYTRIN) 1 MG capsule Take 1 mg by mouth daily.    . traMADol (ULTRAM) 50 MG tablet Take 50 mg by mouth every 6 (six) hours as needed (pain).    . Travoprost, BAK Free, (TRAVATAN) 0.004 % SOLN ophthalmic solution Place 1 drop into the right eye at bedtime.     No current facility-administered medications for this visit.    Allergies:   Corn-containing products and Penicillins   Social History:  The patient  reports that he quit smoking about 56 years ago. His smoking use included Cigarettes. He has a 63 pack-year smoking history. He has never used smokeless tobacco. He reports that he does not drink alcohol or use illicit drugs.   Family History:  The patient's family history includes Cancer in his father and mother; Heart disease in his sister; Stroke in his sister. There is no history of Heart attack.   ROS:  Please see the history of present illness.  All other systems are reviewed and negative.   PHYSICAL EXAM: VS:  BP 130/94 mmHg  Pulse 68  Ht _0  (1.702 m)  Wt 202 lb 6.4 oz (91.808 kg)  BMI 31.69 kg/m2 , BMI Body mass index is 31.69 kg/(m^2). GEN: Well nourished, well developed, pleasant elderly male in no acute distress, hard of hearing HEENT: normal Neck: no JVD, no masses. No carotid bruits Cardiac: RRR with soft systolic ejection murmur at the right upper sternal border                Respiratory:  clear to auscultation bilaterally, normal work of breathing GI: soft, nontender, nondistended, + BS MS: no deformity or atrophy Ext: trace bilateral pretibial edema  Skin: warm and  dry, no rash Neuro:  Strength and sensation are intact Psych: euthymic mood, full affect  EKG:  EKG is ordered today. The ekg ordered today shows sinus rhythm with wandering baseline, age indeterminate inferior MI  Recent Labs: 08/11/2014: Magnesium 1.8 11/26/2014: B Natriuretic Peptide 136.2* 03/01/2015: ALT 28; BUN 19;  Creatinine, Ser 1.01; Potassium 3.7; Sodium 138; TSH 2.39 06/27/2015: Hemoglobin 14.0; Platelets 133*   Lipid Panel     Component Value Date/Time   CHOL 154 02/03/2015 0943   TRIG 267* 02/03/2015 0943   HDL 39* 02/03/2015 0943   CHOLHDL 3.9 02/03/2015 0943   VLDL 53* 02/03/2015 0943   LDLCALC 62 02/03/2015 0943      Wt Readings from Last 3 Encounters:  07/06/15 202 lb 6.4 oz (91.808 kg)  05/13/15 178 lb (80.74 kg)  01/05/15 193 lb (87.544 kg)     Cardiac Studies Reviewed: 2-D echocardiogram pending  ASSESSMENT AND PLAN: 1.  Aortic valve disease now one year out for TAVR with New York Heart Association functional class II symptoms. The patient appears stable. I have personally reviewed his echo images which show normal function of his transcatheter heart valve with a mean gradient of 9 and no significant aortic insufficiency. I will see him back in 6 months for follow-up.  2. Chronic diastolic heart failure: Again, the patient appears stable. He continues on daily Lasix. He is not a candidate for a beta blocker because of history of bradycardia arrhythmia.  3. Coronary artery disease, native vessel, with occasional angina. The patient can use sublingual nitroglycerin as needed. This does not appear to be a primary problem for him at present.  4. Paroxysmal atrial fibrillation: Continue anticoagulation with Eliquis. Rhythm control with amiodarone. Repeat LFTs and thyroid function testing when he returns in 6 months.  Current medicines are reviewed with the patient today.  The patient does not have concerns regarding medicines.  Labs/ tests ordered today  include:   Orders Placed This Encounter  Procedures  . CBC  . Comp Met (CMET)  . TSH  . EKG 12-Lead    Disposition:   FU 6 months  Signed, Sherren Mocha, MD  07/06/2015 5:32 PM    [address], [place], [place]  [address] Phone: [phone]; Fax: [phone]

## 2015-07-12 ENCOUNTER — Encounter (HOSPITAL_COMMUNITY): Payer: Self-pay | Admitting: *Deleted

## 2015-07-12 ENCOUNTER — Telehealth: Payer: Self-pay | Admitting: Licensed Clinical Social Worker

## 2015-07-12 ENCOUNTER — Emergency Department (HOSPITAL_COMMUNITY)
Admission: EM | Admit: 2015-07-12 | Discharge: 2015-07-13 | Disposition: A | Discharge status: Home / Self Care | Payer: Medicare Other | Attending: Emergency Medicine | Admitting: Emergency Medicine

## 2015-07-12 ENCOUNTER — Emergency Department (HOSPITAL_COMMUNITY): Payer: Medicare Other

## 2015-07-12 DIAGNOSIS — I482 Chronic atrial fibrillation: Secondary | ICD-10-CM | POA: Diagnosis not present

## 2015-07-12 DIAGNOSIS — I2510 Atherosclerotic heart disease of native coronary artery without angina pectoris: Secondary | ICD-10-CM | POA: Diagnosis not present

## 2015-07-12 DIAGNOSIS — Z880 Allergy status to penicillin: Secondary | ICD-10-CM | POA: Insufficient documentation

## 2015-07-12 DIAGNOSIS — W1839XA Other fall on same level, initial encounter: Secondary | ICD-10-CM | POA: Diagnosis not present

## 2015-07-12 DIAGNOSIS — Y9389 Activity, other specified: Secondary | ICD-10-CM | POA: Insufficient documentation

## 2015-07-12 DIAGNOSIS — Z87891 Personal history of nicotine dependence: Secondary | ICD-10-CM | POA: Diagnosis not present

## 2015-07-12 DIAGNOSIS — Z954 Presence of other heart-valve replacement: Secondary | ICD-10-CM | POA: Diagnosis not present

## 2015-07-12 DIAGNOSIS — Y92009 Unspecified place in unspecified non-institutional (private) residence as the place of occurrence of the external cause: Secondary | ICD-10-CM | POA: Insufficient documentation

## 2015-07-12 DIAGNOSIS — K219 Gastro-esophageal reflux disease without esophagitis: Secondary | ICD-10-CM | POA: Diagnosis not present

## 2015-07-12 DIAGNOSIS — E785 Hyperlipidemia, unspecified: Secondary | ICD-10-CM | POA: Diagnosis not present

## 2015-07-12 DIAGNOSIS — G4733 Obstructive sleep apnea (adult) (pediatric): Secondary | ICD-10-CM | POA: Insufficient documentation

## 2015-07-12 DIAGNOSIS — I5042 Chronic combined systolic (congestive) and diastolic (congestive) heart failure: Secondary | ICD-10-CM | POA: Insufficient documentation

## 2015-07-12 DIAGNOSIS — L089 Local infection of the skin and subcutaneous tissue, unspecified: Secondary | ICD-10-CM

## 2015-07-12 DIAGNOSIS — E039 Hypothyroidism, unspecified: Secondary | ICD-10-CM | POA: Insufficient documentation

## 2015-07-12 DIAGNOSIS — Z79899 Other long term (current) drug therapy: Secondary | ICD-10-CM | POA: Diagnosis not present

## 2015-07-12 DIAGNOSIS — S0990XA Unspecified injury of head, initial encounter: Secondary | ICD-10-CM

## 2015-07-12 DIAGNOSIS — H409 Unspecified glaucoma: Secondary | ICD-10-CM | POA: Insufficient documentation

## 2015-07-12 DIAGNOSIS — I10 Essential (primary) hypertension: Secondary | ICD-10-CM | POA: Diagnosis not present

## 2015-07-12 DIAGNOSIS — I480 Paroxysmal atrial fibrillation: Secondary | ICD-10-CM | POA: Diagnosis not present

## 2015-07-12 DIAGNOSIS — Y998 Other external cause status: Secondary | ICD-10-CM | POA: Diagnosis not present

## 2015-07-12 DIAGNOSIS — S50312A Abrasion of left elbow, initial encounter: Secondary | ICD-10-CM | POA: Diagnosis not present

## 2015-07-12 DIAGNOSIS — Z9861 Coronary angioplasty status: Secondary | ICD-10-CM | POA: Insufficient documentation

## 2015-07-12 DIAGNOSIS — W19XXXA Unspecified fall, initial encounter: Secondary | ICD-10-CM

## 2015-07-12 LAB — CBC WITH DIFFERENTIAL/PLATELET
Basophils Absolute: 0 10*3/uL (ref 0.0–0.1)
Basophils Relative: 1 %
Eosinophils Absolute: 0.1 10*3/uL (ref 0.0–0.7)
Eosinophils Relative: 2 %
HEMATOCRIT: 45.4 % (ref 39.0–52.0)
HEMOGLOBIN: 15 g/dL (ref 13.0–17.0)
LYMPHS ABS: 1.4 10*3/uL (ref 0.7–4.0)
Lymphocytes Relative: 17 %
MCH: 31.8 pg (ref 26.0–34.0)
MCHC: 33 g/dL (ref 30.0–36.0)
MCV: 96.4 fL (ref 78.0–100.0)
MONO ABS: 0.7 10*3/uL (ref 0.1–1.0)
MONOS PCT: 9 %
NEUTROS ABS: 5.7 10*3/uL (ref 1.7–7.7)
NEUTROS PCT: 71 %
Platelets: 172 10*3/uL (ref 150–400)
RBC: 4.71 MIL/uL (ref 4.22–5.81)
RDW: 14.5 % (ref 11.5–15.5)
WBC: 8 10*3/uL (ref 4.0–10.5)

## 2015-07-12 LAB — BASIC METABOLIC PANEL
ANION GAP: 11 (ref 5–15)
BUN: 13 mg/dL (ref 6–20)
CALCIUM: 9.3 mg/dL (ref 8.9–10.3)
CHLORIDE: 103 mmol/L (ref 101–111)
CO2: 23 mmol/L (ref 22–32)
CREATININE: 0.99 mg/dL (ref 0.61–1.24)
GFR calc non Af Amer: >60 mL/min (ref >60)
GLUCOSE: 106 mg/dL — AB (ref 65–99)
Potassium: 4 mmol/L (ref 3.5–5.1)
Sodium: 137 mmol/L (ref 135–145)

## 2015-07-12 LAB — URINALYSIS, ROUTINE W REFLEX MICROSCOPIC
BILIRUBIN URINE: NEGATIVE
GLUCOSE, UA: NEGATIVE mg/dL
HGB URINE DIPSTICK: NEGATIVE
Ketones, ur: NEGATIVE mg/dL
Leukocytes, UA: NEGATIVE
Nitrite: NEGATIVE
PH: 6.5 (ref 5.0–8.0)
Protein, ur: NEGATIVE mg/dL
SPECIFIC GRAVITY, URINE: 1.017 (ref 1.005–1.030)
Urobilinogen, UA: 1 mg/dL (ref 0.0–1.0)

## 2015-07-12 MED ORDER — SODIUM CHLORIDE 0.9 % IV SOLN
INTRAVENOUS | Status: DC
  Administered 2015-07-12: 21:00:00 via INTRAVENOUS

## 2015-07-12 MED ORDER — SODIUM CHLORIDE 0.9 % IV BOLUS (SEPSIS)
500.0000 mL | Freq: Once | INTRAVENOUS | Status: AC
  Administered 2015-07-12: 500 mL via INTRAVENOUS

## 2015-07-12 NOTE — ED Notes (Signed)
Unable to ambulate pt in hallway due to orthostatic hypotension, upon pt standing pt's knees started to buckle and he was unable to remain standing for BP. MD aware.

## 2015-07-12 NOTE — ED Notes (Signed)
Pt arrives from [facility]. Pt had an unwitnessed fall, no LOC, but may have hit the back of his head on the chair. Pt has an abrasion on left elbow. Denies any neck or back pain. Pt is on Eliquis.

## 2015-07-12 NOTE — ED Notes (Signed)
Pt reporting "My butt is hurting. I think I fell on my butt"

## 2015-07-12 NOTE — Discharge Instructions (Signed)
Abrasion °An abrasion is a cut or scrape on the outer surface of your skin. An abrasion does not extend through all of the layers of your skin. It is important to care for your abrasion properly to prevent infection. °CAUSES °Most abrasions are caused by falling on or gliding across the ground or another surface. When your skin rubs on something, the outer and inner layer of skin rubs off.  °SYMPTOMS °A cut or scrape is the main symptom of this condition. The scrape may be bleeding, or it may appear red or pink. If there was an associated fall, there may be an underlying bruise. °DIAGNOSIS °An abrasion is diagnosed with a physical exam. °TREATMENT °Treatment for this condition depends on how large and deep the abrasion is. Usually, your abrasion will be cleaned with water and mild soap. This removes any dirt or debris that may be stuck. An antibiotic ointment may be applied to the abrasion to help prevent infection. A bandage (dressing) may be placed on the abrasion to keep it clean. °You may also need a tetanus shot. °HOME CARE INSTRUCTIONS °Medicines °· Take or apply medicines only as directed by your health care provider. °· If you were prescribed an antibiotic ointment, finish all of it even if you start to feel better. °Wound Care °· Clean the wound with mild soap and water 2-3 times per day or as directed by your health care provider. Pat your wound dry with a clean towel. Do not rub it. °· There are many different ways to close and cover a wound. Follow instructions from your health care provider about: °· Wound care. °· Dressing changes and removal. °· Check your wound every day for signs of infection. Watch for: °· Redness, swelling, or pain. °· Fluid, blood, or pus. °General Instructions °· Keep the dressing dry as directed by your health care provider. Do not take baths, swim, use a hot tub, or do anything that would put your wound underwater until your health care provider approves. °· If there is  swelling, raise (elevate) the injured area above the level of your heart while you are sitting or lying down. °· Keep all follow-up visits as directed by your health care provider. This is important. °SEEK MEDICAL CARE IF: °· You received a tetanus shot and you have swelling, severe pain, redness, or bleeding at the injection site. °· Your pain is not controlled with medicine. °· You have increased redness, swelling, or pain at the site of your wound. °SEEK IMMEDIATE MEDICAL CARE IF: °· You have a red streak going away from your wound. °· You have a fever. °· You have fluid, blood, or pus coming from your wound. °· You notice a bad smell coming from your wound or your dressing. °  °This information is not intended to replace advice given to you by your health care provider. Make sure you discuss any questions you have with your health care provider. °  °Document Released: 07/04/2005 Document Revised: 06/15/2015 Document Reviewed: 09/22/2014 °Elsevier Interactive Patient Education ©2016 [employer] ° °Head Injury, Adult °You have a head injury. Headaches and throwing up (vomiting) are common after a head injury. It should be easy to wake up from sleeping. Sometimes you must stay in the hospital. Most problems happen within the first 24 hours. Side effects may occur up to 7-10 days after the injury.  °WHAT ARE THE TYPES OF HEAD INJURIES? °Head injuries can be as minor as a bump. Some head injuries can be more   severe. More severe head injuries include: °· A jarring injury to the brain (concussion). °· A bruise of the brain (contusion). This mean there is bleeding in the brain that can cause swelling. °· A cracked skull (skull fracture). °· Bleeding in the brain that collects, clots, and forms a bump (hematoma). °WHEN SHOULD I GET HELP RIGHT AWAY?  °· You are confused or sleepy. °· You cannot be woken up. °· You feel sick to your stomach (nauseous) or keep throwing up (vomiting). °· Your dizziness or unsteadiness is  getting worse. °· You have very bad, lasting headaches that are not helped by medicine. Take medicines only as told by your doctor. °· You cannot use your arms or legs like normal. °· You cannot walk. °· You notice changes in the black spots in the center of the colored part of your eye (pupil). °· You have clear or bloody fluid coming from your nose or ears. °· You have trouble seeing. °During the next 24 hours after the injury, you must stay with someone who can watch you. This person should get help right away (call 911 in the [place] if you start to shake and are not able to control it (have seizures), you pass out, or you are unable to wake up. °HOW CAN I PREVENT A HEAD INJURY IN THE FUTURE? °· Wear seat belts. °· Wear a helmet while bike riding and playing sports like football. °· Stay away from dangerous activities around the house. °WHEN CAN I RETURN TO NORMAL ACTIVITIES AND ATHLETICS? °See your doctor before doing these activities. You should not do normal activities or play contact sports until 1 week after the following symptoms have stopped: °· Headache that does not go away. °· Dizziness. °· Poor attention. °· Confusion. °· Memory problems. °· Sickness to your stomach or throwing up. °· Tiredness. °· Fussiness. °· Bothered by bright lights or loud noises. °· Anxiousness or depression. °· Restless sleep. °MAKE SURE YOU:  °· Understand these instructions. °· Will watch your condition. °· Will get help right away if you are not doing well or get worse. °  °This information is not intended to replace advice given to you by your health care provider. Make sure you discuss any questions you have with your health care provider. °  °Document Released: 09/06/2008 Document Revised: 10/15/2014 Document Reviewed: 06/01/2013 °Elsevier Interactive Patient Education ©2016 [employer] ° °

## 2015-07-12 NOTE — ED Provider Notes (Signed)
CSN: [id number]     Arrival date & time 07/12/15  1736 History   First MD Initiated Contact with Patient 07/12/15 1742     Chief Complaint  Patient presents with  . Fall     (Consider location/radiation/quality/duration/timing/severity/associated sxs/prior Treatment) HPI   Eugene [name] is a 81 y.o. male who presents for evaluation of fall. He states that he was trying to get to his bed, using his walker, when he fell. He lives in a "rest home". He had a similar fall couple weeks ago and was evaluated in the emergency department. He denies recent fever, chills, cough, shortness of breath, new focal weakness or paresthesia. He is a somewhat poor historian. There are no other known modifying factors.   Past Medical History  Diagnosis Date  . Glaucoma     Right eye  . Diverticulosis     multiple hospital admissions for GI bleed, has an episode roughly every 6 months,, last  colonscopy in 2006 showed diverticulosis, was evaluated by Dr. Madilyn Fireman in the hospital in 2011 for lower GI bleed andd it  was suggested that the patient was not actively bleeding at that time and given his comorbid conditions colonoscopy was deferred. He is also chronically constipated  . Macular degeneration of right eye   . Allergic rhinitis   . CAD (coronary artery disease)     a. h/o remote inferior MI, h/o PCI to RCA in 1991. b. syncope 2013: severe AS and high grade prox LAD and other diffuse disease. High risk for CABG - s/p BMS to prox LAD and balloon aortic valvuloplasty. c. cath 07/16/14 - diffuse CAD, managed medically.  . Hypertension   . Hyperlipidemia   . Chronic combined systolic and diastolic CHF (congestive heart failure) (HCC)     a. Most recent echo 08/2014: EF 30-35%.  . Supraventricular tachycardia (HCC)     2 syndromes-nonsustained atrial tachycardia//adenosine responsive diuretic positive reentry probably AV node reentry  . Aortic stenosis     a. s/p AV balloon valvuloplasty by Dr. Excell Seltzer 11/2011.  b. s/p TAVR 08/2014.  . Ischemic cardiomyopathy     a. 2D ECHO 11/2014: EF of 20-25%  . H/O: GI bleed   . Hypothyroidism   . GERD (gastroesophageal reflux disease)   . Falls frequently     3 times in the past week/notes 09/11/2013  . PAF (paroxysmal atrial fibrillation) (HCC)     a. on amiodarone and eliquis   . Peripheral vascular disease (HCC)     a. h/o ulcer on the left great toe s/p angioplasty of the left anterior tibial artery.  . OSA on CPAP   . Limited mobility   . S/P TAVR (transcatheter aortic valve replacement)     a. 08/2014: 29 mm Edwards Sapien XT transcatheter heart valve placed via open left transfemoral approach  . PAT (paroxysmal atrial tachycardia) (HCC)   . Atrial flutter [facility])    Past Surgical History  Procedure Laterality Date  . Glaucoma surgery      Implantation of Baerveldt glaucoma device lant with scleral reinforcement using tutoplast tissue graft right eye. [Other]  . Coronary angioplasty with stent placement  Jan. 23, 2013  . Corneal transplant Right   . Angioplasty Left 03/30/2013    tibial  . Tonsillectomy  1946  . Cataract extraction w/ intraocular lens  implant, bilateral Bilateral   . Cardiac valve replacement  11/30/2011    aortic valve.?  . Transcatheter aortic valve replacement, transfemoral N/A 08/10/2014  Procedure: TRANSCATHETER AORTIC VALVE REPLACEMENT, TRANSFEMORAL;  Surgeon: Tonny Bollman, MD;  Location: [facility] OR;  Service: Open Heart Surgery;  Laterality: N/A;  . Tee without cardioversion N/A 08/10/2014    Procedure: TRANSESOPHAGEAL ECHOCARDIOGRAM (TEE);  Surgeon: Tonny Bollman, MD;  Location: [facility] OR;  Service: Open Heart Surgery;  Laterality: N/A;  . Coronary angiogram  11/02/2011    Procedure: CORONARY ANGIOGRAM;  Surgeon: Rollene Rotunda, MD;  Location: [facility] CATH LAB;  Service: Cardiovascular;;  . Left heart catheterization with coronary angiogram N/A 11/12/2011    Procedure: LEFT HEART CATHETERIZATION WITH CORONARY ANGIOGRAM;  Surgeon:  Tonny Bollman, MD;  Location: [facility] CATH LAB;  Service: Cardiovascular;  Laterality: N/A;  . Percutaneous coronary stent intervention (pci-s) N/A 11/14/2011    Procedure: PERCUTANEOUS CORONARY STENT INTERVENTION (PCI-S);  Surgeon: Tonny Bollman, MD;  Location: [facility] CATH LAB;  Service: Cardiovascular;  Laterality: N/A;  . Abdominal aortagram N/A 03/30/2013    Procedure: ABDOMINAL Ronny Flurry;  Surgeon: Chuck Hint, MD;  Location: [facility] CATH LAB;  Service: Cardiovascular;  Laterality: N/A;  . Left and right heart catheterization with coronary angiogram N/A 07/16/2014    Procedure: LEFT AND RIGHT HEART CATHETERIZATION WITH CORONARY ANGIOGRAM;  Surgeon: Micheline Chapman, MD;  Location: [facility] CATH LAB;  Service: Cardiovascular;  Laterality: N/A;   Family History  Problem Relation Age of Onset  . Cancer Mother     Ovarian Cancer  . Cancer Father     Lung  . Heart disease Sister     Heart disease before age 87  . Stroke Sister   . Heart attack Neg Hx    Social History  Substance Use Topics  . Smoking status: Former Smoker -- 3.00 packs/day for 21 years    Types: Cigarettes    Quit date: 10/08/1958  . Smokeless tobacco: Never Used  . Alcohol Use: No    Review of Systems  All other systems reviewed and are negative.     Allergies  Corn-containing products and Penicillins  Home Medications   Prior to Admission medications   Medication Sig Start Date End Date Taking? Authorizing Provider  acetaminophen (TYLENOL) 500 MG tablet Take 1,000 mg by mouth every 6 (six) hours as needed (pain).   Yes Historical Provider, MD  amiodarone (PACERONE) 200 MG tablet Take 1 tablet (200 mg total) by mouth 2 (two) times daily. 12/02/14  Yes Janetta Hora, PA-C  apixaban (ELIQUIS) 2.5 MG TABS tablet Take 1 tablet (2.5 mg total) by mouth 2 (two) times daily. 12/02/14  Yes Janetta Hora, PA-C  Calcium Carb-Cholecalciferol (CALCIUM 600+D) 600-800 MG-UNIT TABS Take 1 tablet by mouth daily.   Yes  Historical Provider, MD  carboxymethylcellulose (REFRESH) 1 % ophthalmic solution Place 4 drops into both eyes 4 (four) times daily.   Yes Historical Provider, MD  docusate sodium (COLACE) 100 MG capsule Take 100 mg by mouth 2 (two) times daily as needed for mild constipation (constipation).   Yes Historical Provider, MD  furosemide (LASIX) 40 MG tablet Take 1 tablet (40 mg total) by mouth daily. 12/02/14  Yes Janetta Hora, PA-C  Multiple Vitamins-Minerals (PRESERVISION AREDS PO) Take 1 capsule by mouth 2 (two) times daily.   Yes Historical Provider, MD  omeprazole (PRILOSEC) 20 MG capsule Take 20 mg by mouth daily.   Yes Historical Provider, MD  oxybutynin (DITROPAN) 5 MG tablet Take 5 mg by mouth 2 (two) times daily.   Yes Historical Provider, MD  polyethylene glycol (MIRALAX / GLYCOLAX) packet Take 17 g  by mouth as needed. Take one packet mixed with liquid by mouth on Monday, Wednesday, and Friday   Yes Historical Provider, MD  potassium chloride (K-DUR) 10 MEQ tablet Take 2 tablets (20 mEq total) by mouth daily. 12/02/14  Yes Janetta Hora, PA-C  pravastatin (PRAVACHOL) 40 MG tablet Take 1 tablet (40 mg total) by mouth daily. 06/17/14  Yes Earl Lagos, MD  Skin Protectants, Misc. (BAZA PROTECT EX) Apply 1 application topically 3 (three) times daily.   Yes Historical Provider, MD  tamsulosin (FLOMAX) 0.4 MG CAPS capsule Take 1 capsule (0.4 mg total) by mouth daily. 12/02/14  Yes Janetta Hora, PA-C  terazosin (HYTRIN) 1 MG capsule Take 1 mg by mouth daily.   Yes Historical Provider, MD  traMADol (ULTRAM) 50 MG tablet Take 50 mg by mouth every 6 (six) hours as needed (pain).   Yes Historical Provider, MD  Travoprost, BAK Free, (TRAVATAN) 0.004 % SOLN ophthalmic solution Place 1 drop into the right eye at bedtime.   Yes Historical Provider, MD  cephALEXin (KEFLEX) 500 MG capsule Take 1 capsule (500 mg total) by mouth 4 (four) times daily. Patient not taking: Reported on 07/12/2015  06/27/15   Leta Baptist, MD  levothyroxine (SYNTHROID, LEVOTHROID) 25 MCG tablet TAKE 1 TABLET BY MOUTH EVERY DAY Patient not taking: Reported on 07/12/2015 02/09/15   Earl Lagos, MD  Multiple Vitamin (MULTIVITAMIN) tablet Take 1 tablet by mouth at bedtime.    Historical Provider, MD  nitroGLYCERIN (NITROSTAT) 0.4 MG SL tablet Place 1 tablet (0.4 mg total) under the tongue every 5 (five) minutes x 3 doses as needed for chest pain. 12/02/14   Janetta Hora, PA-C   BP 202/99 mmHg  Pulse 59  Temp(Src) 97.9 F (36.6 C) (Oral)  Resp 17  Ht  (1.702 m)  Wt 202 lb 4.8 oz (91.763 kg)  BMI 31.68 kg/m2  SpO2 96% Physical Exam  Constitutional: He is oriented to person, place, and time. He appears well-developed.  Elderly, frail  HENT:  Head: Normocephalic.  Right Ear: External ear normal.  Left Ear: External ear normal.  Small contusion mid parietal, nonbleeding. No scalp laceration or abrasion.  Eyes: Conjunctivae and EOM are normal. Pupils are equal, round, and reactive to light.  Neck: Normal range of motion and phonation normal. Neck supple.  Cardiovascular: Normal rate, regular rhythm and normal heart sounds.   Pulmonary/Chest: Effort normal and breath sounds normal. He exhibits no bony tenderness.  Abdominal: Soft. There is no tenderness.  Musculoskeletal: Normal range of motion.  Large abrasion, left lateral elbow, without associated swelling or deformity. Normal range of motion, arms and legs bilaterally.  Neurological: He is alert and oriented to person, place, and time. No cranial nerve deficit or sensory deficit. He exhibits normal muscle tone. Coordination normal.  Skin: Skin is warm, dry and intact.  Psychiatric: He has a normal mood and affect. His behavior is normal.  Nursing note and vitals reviewed.   ED Course  Procedures (including critical care time)  Medications  0.9 %  sodium chloride infusion ( Intravenous New Bag/Given 07/12/15 2116)  sodium chloride  0.9 % bolus 500 mL (500 mLs Intravenous New Bag/Given 07/12/15 2116)    Patient Vitals for the past 24 hrs:  BP Temp Temp src Pulse Resp SpO2 Height Weight  07/12/15 2000 (!) 202/99 mmHg - - (!) 59 - 96 % - -  07/12/15 1930 197/94 mmHg - - (!) 53 - 96 % - -  07/12/15 1900 (!) 208/98 mmHg - - 60 - 98 % - -  07/12/15 1845 192/98 mmHg - - (!) 57 17 97 % - -  07/12/15 1830 174/89 mmHg - - (!) 58 14 96 % - -  07/12/15 1815 178/95 mmHg - - (!) 57 13 95 % - -  07/12/15 1800 187/89 mmHg - - (!) 58 15 96 % - -  07/12/15 1745 - - - - - 96 % - -  07/12/15 1745 189/89 mmHg - - (!) 58 15 98 % - -  07/12/15 1742 193/85 mmHg - - 62 18 97 % 5\' 7"  (1.702 m) 202 lb 4.8 oz (91.763 kg)  07/12/15 1741 - 97.9 F (36.6 C) Oral - - - - -    8:58 PM Reevaluation with update and discussion. After initial assessment and treatment, an updated evaluation reveals patient is alert, eating crackers and peanut butter and has no further complaints. Attempt at orthostatic blood pressure and pulse testing, was limited by his inability to stand. When the patient attempted to stand, both knees "buckled", requiring him to be supported and then put back in bed. Additional evaluation ordered.Mancel Bale L    Labs Review Labs Reviewed  BASIC METABOLIC PANEL - Abnormal; Notable for the following:    Glucose, Bld 106 (*)    All other components within normal limits  URINE CULTURE  CBC WITH DIFFERENTIAL/PLATELET  URINALYSIS, ROUTINE W REFLEX MICROSCOPIC (NOT AT [facility])    Imaging Review Ct Head Wo Contrast  07/12/2015   CLINICAL DATA:  Head trauma after falling while trying to get into a wheelchair at a nursing facility today.  EXAM: CT HEAD WITHOUT CONTRAST  TECHNIQUE: Contiguous axial images were obtained from the base of the skull through the vertex without intravenous contrast.  COMPARISON:  CT scan dated 06/27/2015  FINDINGS: No acute intracranial hemorrhage, acute infarction, or intracranial mass lesion. Diffuse fairly  severe cerebral cortical atrophy with secondary ventricle dilatation. Old left caudate infarct. Chronic periventricular white matter ischemic changes, stable.  No acute osseous abnormality.  Hyperostosis frontalis interna.  IMPRESSION: No acute intracranial abnormality. Diffuse fairly severe atrophy with chronic small vessel ischemic disease and old left lacunar infarct.   Electronically Signed   By: Francene Boyers M.D.   On: 07/12/2015 20:30   I have personally reviewed and evaluated these images and lab results as part of my medical decision-making.   EKG Interpretation None      MDM   Final diagnoses:  Fall, initial encounter  Head injury, initial encounter  Abrasion, elbow with infection, left, initial encounter    Fall, with weakness, and balance disorder. Similar fall 2 weeks ago, evaluated in the ED. No serious head injury, tonight. Patient has known aortic stenosis and was evaluated within the last week by his cardiologist in deemed stable for continued usual treatment relative to that. I doubt that he has a worsening of his aortic stenosis, that could've contributed to balance disorder or falling. Evaluation for causative factors negative. Doubt serous bacterial infection. Metabolic instability or impending vascular collapse   Nursing Notes Reviewed/ Care Coordinated Applicable Imaging Reviewed Interpretation of Laboratory Data incorporated into ED treatment  The patient appears reasonably screened and/or stabilized for discharge and I doubt any other medical condition or other [facility] requiring further screening, evaluation, or treatment in the ED at this time prior to discharge.  Plan: Home Medications- usual; Home Treatments- rest; return here if the recommended treatment, does not improve the symptoms;  Recommended follow up- PCP check up in 1 week     Mancel Bale, MD 07/12/15 2246

## 2015-07-12 NOTE — Telephone Encounter (Signed)
Mr. Boydstun was referred to CSW as physician determined potenLangenbachl candidate for [facility]: Home Based Palliative care.  Upon review of pt's chart, pt currently listed under another PCP provider.  CSW placed call to guardian listed on chart.  Confirmed Mr. Sagan is no longer a patient of [facility].  Pt currently living at [facility] and is followed by their physician Dr. Redmond School.

## 2015-07-13 LAB — URINE CULTURE: SPECIAL REQUESTS: NORMAL

## 2015-08-09 ENCOUNTER — Other Ambulatory Visit: Payer: Self-pay

## 2015-08-09 DIAGNOSIS — I5042 Chronic combined systolic (congestive) and diastolic (congestive) heart failure: Secondary | ICD-10-CM

## 2015-08-09 DIAGNOSIS — Z9181 History of falling: Secondary | ICD-10-CM | POA: Insufficient documentation

## 2015-08-09 NOTE — Patient Outreach (Addendum)
[facility](THN) Care Management  08/09/2015  Eugene [name] 04/23/1924 [id number]   Telephone Screen  Referral Date:  08/09/2015 Referral Source:  NextGen Tier 4 List as of 04/15/2015 Referral Issue:  CHF, COPD, 2 admissions, 2 ED visits and 1 SNF  Outreach call # 1 to patient. Patient's HCPOA/Rev. [name]. H/o DPR on file for  [employer]PL GroupJames [name] (legal guardian) for all [facility] practices.  Primary MD:  Dr. Florentina JennyHenry Tripp Cardiologist:  Dr. [name]  Cardiovascular MD:  Dr. [name]: appts every 6 months  Podiatrist:  yes   Social:   Patient resides [facility], [address], [place], [place] [address] [phone] but contact Casimiro NeedleMichael. Ok for all [facility] practices to leave a detailed messages. Mobility:  Poor balance and ambulates with walker.  HCPOA reports patient active with PT services but unable to provide name of agency.  H/o past services with [facility] per Epic MR.  Falls:  Several over the past year.  Two falls in past 30 days requiring ER visits.  Caregiver:  HCPOA/ Rev. Orland PenmanJames [name] [phone]  [address] [place], [place] [address] Transportation:  [facility] D[facility]:  Dan HumphreysWalker with wheels   CHF: HCPOA unable to provide details regarding CHF management   HTN: H/o Hypertensive on 07/12/2015 ER Note.   Medications: Managed by facility, [facility] Flu:  Last vaccine 06/11/2014 per [facility] MR.  HCPOA states he is not sure if patient has received and states most likely managed by [facility].    Consent: HCPOA agreed to [facility] services  Plan: [facility] Community RN CM Referral: -High risk for admission and ED utilization.  -more accurate assessment needed to determine if patient is following appropriate disease management plan for CHF and HTN management in the outpatient setting/home/ALF. -fall risk with h/o multiple falls requiring ED utilization. H/o  chronic anticoagulation.  -assessment needed to assess for correct level of care due to patient with elevated  BP on 07/2015 ED visit, fall risk and increased # of falls.  -continue to follow-up on flu vaccine 2016  and provide education as needed.    [facility] Referral: -More than 10 medications (managed by [facility]; HCPOA unable to answer any questions regarding medications). -Flu Vaccine  [facility] Community RN CM to follow up with patient within 2 weeks.  RN CM notified [facility] Administrative Assistant: agreed to services/case opened. RN CM advised to please notify MD of any changes in condition prior to scheduled appt's.   RN CM provided contact name and # [phone] or main office # [phone] and 24-hour nurse line # [phone].  RN CM confirmed patient is aware of 911 services for urgent emergency needs.  Donato Schultzrystal Hasna Stefanik, RN, BSN, [facility], CCM  [facility] Care Management Care Management Coordinator [phone] Direct [phone] Cell [phone] Office [phone] Fax

## 2015-08-09 NOTE — Patient Outreach (Signed)
[facility](THN) Care Management  08/09/2015  Eugene [name] 03-Apr-1924 [id number]   Referral from NextGen Tier4 List, assigned Donato Schultzrystal Hutchinson, RN to outreach for [facility] Care [facility] services.  Thanks, Corrie MckusickLisa O. Sharlee BlewMoore, AABA [facility] Care Management [facility] CM Assistant Phone: [phone] Fax: [phone]

## 2015-08-10 NOTE — Patient Outreach (Signed)
[facility](THN) Care Management  08/10/2015  Eugene [name] N Lall May 15, 1924 [id number]   Request from Donato Schultzrystal Hutchinson, RN to assign Pharmacy and Community RN, assigned [name], PharmD and [name], RN.  Thanks, Corrie MckusickLisa O. Sharlee BlewMoore, AABA [name] Care Management [name] Phone: [phone] Fax: [phone]

## 2015-08-15 ENCOUNTER — Other Ambulatory Visit: Payer: Self-pay | Admitting: *Deleted

## 2015-08-15 NOTE — Patient Outreach (Signed)
[facility](THN) Care Management  08/15/2015  Barrie Dunkerfird N Hertzog August 09, 1924 [id number]   Assessment: Care coordination call Referral received from Telephonic community care management coordinator (C Hutchinson) for accurate assessment if following disease management for Heart Failure and hypertension; fall risk related to multiple falls resulting to emergency room visits and follow-up with flu vaccine. Call placed to patient but unable to reach him. HIPAA compliant voice message left with name and contact number.   Plan: Will await for call back. If unable to receive a return call, will schedule patient for next outreach call.   Johanan Skorupski A. Kaikoa Magro, BSN, RN-BC [facility] Community Care Management Coordinator Cell: [phone]

## 2015-08-16 ENCOUNTER — Other Ambulatory Visit: Payer: Self-pay | Admitting: *Deleted

## 2015-08-16 NOTE — Patient Outreach (Signed)
[facility](THN) Care Management  08/16/2015  Eugene [name] Eugene [name] 03-Sep-1924 [id number]   Assessment: Care coordination call- second attempt Unable to receive a return call for the message left yesterday.  Call placed to patient but unable to reach him. HIPAA compliant voice message left with name and contact information.  Plan: Will await for a call back from patient. If unable to receive a return call, will schedule patient for the next outreach call.  Evolett Somarriba A. Shloimy Michalski, BSN, RN-BC [facility] Community Care Management Coordinator Cell: [phone]

## 2015-08-17 ENCOUNTER — Encounter: Payer: Self-pay | Admitting: *Deleted

## 2015-08-17 ENCOUNTER — Other Ambulatory Visit: Payer: Self-pay | Admitting: *Deleted

## 2015-08-17 NOTE — Patient Outreach (Signed)
[facility](THN) Care Management  08/17/2015  Barrie Dunkerfird N Mourer 1923/12/12 [id number]   Assessment: Care coordination call - third attempt Unable to receive any return call from messages left on voicemail of phone number provided. Call placed to patient and emergency contact [name]([name]- legal guardian) but unable to reach both. HIPAA compliant voice message left with name and contact information.  Call placed to another emergency contact Jeanella Anton([name] Kenney) and was able to speak to him but states he "no longer works with patient" and unable to give a contact number to reach him but just [facility].  Call placed to [facility] but unable to reach patient.   Plan: Will await for return call. If unable to receive any call back, will send Patient Outreach Letter and await for response.   Cherye Gaertner A. Rosland Riding, BSN, RN-BC [facility] Community Care Management Coordinator Cell: [phone]

## 2015-08-19 ENCOUNTER — Other Ambulatory Visit: Payer: Self-pay | Admitting: Pharmacist

## 2015-08-19 NOTE — Patient Outreach (Signed)
[facility](THN) Care Management  08/19/2015  Barrie Dunkerfird N Chipman 24-Jul-1924 [id number]   Dannette Barbarafird Garcialopez is a 81yo male who was [facility] for medication review.  [facility].  His [facility].  I called [facility] to get an updated list of patient's medications for a medication review.  I spoke to Milling[place]Sandy who reports she is unable to provide me with a mediation list.  Medication list will have to be obtained from head nurse Lanora ManisElizabeth who will be back at work on Monday, 08/22/15.  I will make another outreach call on 08/22/15 to attempt to obtain updated medication list.    Lilla Shookachel Henderson, Pharm.D. Pharmacy Resident [facility] [phone]

## 2015-08-22 ENCOUNTER — Other Ambulatory Visit: Payer: Self-pay | Admitting: Pharmacist

## 2015-08-22 NOTE — Patient Outreach (Signed)
[facility](THN) Care Management  08/22/2015  Barrie Dunkerfird N Vondra Oct 22, 1923 [id number]   Dannette Barbarafird Shuttleworth is a 81yo male who was [facility] for medication review. [facility]. [facility]. I called [facility] to get an updated list of patient's medications for a medication review. I spoke to the head nurse Lanora ManisElizabeth.  Lanora Manislizabeth reports they will need permission from patient's HCPOA in order to send a medication list.    I spoke to patient's HCPOA Rev. [name].  I explained [facility] and the referral for pharmacy to complete a medication review.  Rev. [name] has given verbal consent for [facility] services and was agreeable to have medication list sent from [facility].  Will send Rev. [name] a written consent form to mail back.  Once written consent is received, I will send consent form with request for medication list to [facility].     Lilla Shookachel Tahara Ruffini, Pharm.D. Pharmacy Resident [facility] [phone]

## 2015-08-26 ENCOUNTER — Other Ambulatory Visit: Payer: Self-pay | Admitting: *Deleted

## 2015-08-26 NOTE — Patient Outreach (Signed)
[facility](THN) Care Management  08/26/2015  Barrie Dunkerfird N Dicaprio Mar 07, 1924 [id number]   Assessment: Care coordination- follow-up call Unable to have success getting in contact with patient or health care power of attorney. [facility] pharmacist provided an alternative number for care management coordinator to call at [facility]. Call placed to [facility] and was able to speak with Andrey CampanileSandy (business office) and she shared that patient is a resident of this assisted living facility but she is not allowed to give out patient's personal phone number. Ms. Andrey CampanileSandy states "I can get him the message and he will have to call you back". Care management coordinator's name and contact number provided.   Plan: Will await for return call. If unable to receive a call back, will try to coordinate with [facility] Fleet Contras(Rachel), if still unable, then will resume with the Patient Outreach Letter already sent on 11/10 and see if able to receive a response from it.  Kayon Dozier A. Peja Allender, BSN, RN-BC [facility] Community Care Management Coordinator Cell: [phone]

## 2015-09-05 ENCOUNTER — Encounter: Payer: Self-pay | Admitting: *Deleted

## 2015-09-05 ENCOUNTER — Other Ambulatory Visit: Payer: Self-pay | Admitting: *Deleted

## 2015-09-05 NOTE — Patient Outreach (Signed)
[facility](THN) Care Management  09/05/2015  Barrie Dunkerfird N Lurry [birth year] [id number]  Assessment: Care coordination call  Unable to receive any response from patient or health care power of attorney to Patient Outreach Letter sent on 11/10. Unable to have success in getting in contact with patient or health care power of attorney (Rev. Orland PenmanJames [name]). Call placed one more time to patient's health care power of attorney prior to closing the case but unable to reach him. HIPAA compliant voice message left with name and contact number.    Plan: Will await for a return call. If unable to receive a call back towards the end of the day, will close the case due to difficulty in establishing contact with patient or healthcare power of attorney. Will notify primary care provider of case closure.  Latonia Conrow A. Koriana Stepien, BSN, RN-BC [facility] Community Care Management Coordinator Cell: [phone]

## 2015-09-20 ENCOUNTER — Other Ambulatory Visit: Payer: Self-pay | Admitting: Pharmacist

## 2015-09-20 NOTE — Patient Outreach (Signed)
[facility](THN) Care Management  09/20/2015  Barrie Dunkerfird N Starry 1924/05/24 [id number]   Eugene [name] is a 81yo who [facility].  [facility] and [facility].  I made multiple attempts to contact [facility] to obtain updated medication list for a medication review.  [facility] was not able to provide me with updated med list. I also reached out to [facility] for consent to get medication list from [facility] but there has been no response to [facility] Patient Outreach Letter sent by [facility] on 08/18/15 or consent form that was sent on 08/22/15.  Will close pharmacy program at this time as I am unable to obtain updated medication list.     Lilla Shookachel Henderson, Pharm.D. Pharmacy Resident [facility] [phone]

## 2016-01-11 ENCOUNTER — Encounter: Payer: Self-pay | Admitting: Cardiovascular Disease

## 2016-01-17 ENCOUNTER — Emergency Department (HOSPITAL_COMMUNITY): Payer: Medicare Other

## 2016-01-17 ENCOUNTER — Observation Stay (HOSPITAL_COMMUNITY)
Admission: EM | Admit: 2016-01-17 | Discharge: 2016-01-20 | Disposition: A | Discharge status: Home / Self Care | Payer: Medicare Other | Attending: Internal Medicine | Admitting: Internal Medicine

## 2016-01-17 ENCOUNTER — Encounter (HOSPITAL_COMMUNITY): Payer: Self-pay | Admitting: Emergency Medicine

## 2016-01-17 DIAGNOSIS — I2510 Atherosclerotic heart disease of native coronary artery without angina pectoris: Secondary | ICD-10-CM | POA: Insufficient documentation

## 2016-01-17 DIAGNOSIS — I482 Chronic atrial fibrillation, unspecified: Secondary | ICD-10-CM | POA: Diagnosis present

## 2016-01-17 DIAGNOSIS — N189 Chronic kidney disease, unspecified: Secondary | ICD-10-CM | POA: Diagnosis not present

## 2016-01-17 DIAGNOSIS — E039 Hypothyroidism, unspecified: Secondary | ICD-10-CM | POA: Diagnosis not present

## 2016-01-17 DIAGNOSIS — G4733 Obstructive sleep apnea (adult) (pediatric): Secondary | ICD-10-CM | POA: Diagnosis present

## 2016-01-17 DIAGNOSIS — I209 Angina pectoris, unspecified: Secondary | ICD-10-CM

## 2016-01-17 DIAGNOSIS — I5043 Acute on chronic combined systolic (congestive) and diastolic (congestive) heart failure: Secondary | ICD-10-CM | POA: Diagnosis not present

## 2016-01-17 DIAGNOSIS — Z952 Presence of prosthetic heart valve: Secondary | ICD-10-CM | POA: Diagnosis not present

## 2016-01-17 DIAGNOSIS — I872 Venous insufficiency (chronic) (peripheral): Secondary | ICD-10-CM | POA: Diagnosis present

## 2016-01-17 DIAGNOSIS — Z880 Allergy status to penicillin: Secondary | ICD-10-CM | POA: Diagnosis not present

## 2016-01-17 DIAGNOSIS — I739 Peripheral vascular disease, unspecified: Secondary | ICD-10-CM | POA: Insufficient documentation

## 2016-01-17 DIAGNOSIS — R079 Chest pain, unspecified: Secondary | ICD-10-CM | POA: Diagnosis not present

## 2016-01-17 DIAGNOSIS — Z955 Presence of coronary angioplasty implant and graft: Secondary | ICD-10-CM | POA: Diagnosis not present

## 2016-01-17 DIAGNOSIS — I350 Nonrheumatic aortic (valve) stenosis: Secondary | ICD-10-CM | POA: Diagnosis not present

## 2016-01-17 DIAGNOSIS — I255 Ischemic cardiomyopathy: Secondary | ICD-10-CM | POA: Diagnosis not present

## 2016-01-17 DIAGNOSIS — I130 Hypertensive heart and chronic kidney disease with heart failure and stage 1 through stage 4 chronic kidney disease, or unspecified chronic kidney disease: Secondary | ICD-10-CM | POA: Diagnosis not present

## 2016-01-17 DIAGNOSIS — I5042 Chronic combined systolic (congestive) and diastolic (congestive) heart failure: Secondary | ICD-10-CM | POA: Diagnosis present

## 2016-01-17 DIAGNOSIS — I10 Essential (primary) hypertension: Secondary | ICD-10-CM | POA: Diagnosis present

## 2016-01-17 DIAGNOSIS — R0789 Other chest pain: Secondary | ICD-10-CM | POA: Insufficient documentation

## 2016-01-17 DIAGNOSIS — N400 Enlarged prostate without lower urinary tract symptoms: Secondary | ICD-10-CM | POA: Diagnosis not present

## 2016-01-17 DIAGNOSIS — L8990 Pressure ulcer of unspecified site, unspecified stage: Secondary | ICD-10-CM | POA: Insufficient documentation

## 2016-01-17 DIAGNOSIS — K219 Gastro-esophageal reflux disease without esophagitis: Secondary | ICD-10-CM | POA: Insufficient documentation

## 2016-01-17 DIAGNOSIS — E785 Hyperlipidemia, unspecified: Secondary | ICD-10-CM | POA: Insufficient documentation

## 2016-01-17 DIAGNOSIS — Z87891 Personal history of nicotine dependence: Secondary | ICD-10-CM | POA: Insufficient documentation

## 2016-01-17 LAB — URINALYSIS, ROUTINE W REFLEX MICROSCOPIC
Bilirubin Urine: NEGATIVE
Glucose, UA: NEGATIVE mg/dL
Ketones, ur: NEGATIVE mg/dL
Leukocytes, UA: NEGATIVE
Nitrite: NEGATIVE
Protein, ur: NEGATIVE mg/dL
Specific Gravity, Urine: 1.014 (ref 1.005–1.030)
pH: 6.5 (ref 5.0–8.0)

## 2016-01-17 LAB — CBC
HCT: 41.1 % (ref 39.0–52.0)
Hemoglobin: 13.5 g/dL (ref 13.0–17.0)
MCH: 30.9 pg (ref 26.0–34.0)
MCHC: 32.8 g/dL (ref 30.0–36.0)
MCV: 94.1 fL (ref 78.0–100.0)
Platelets: 131 K/uL — ABNORMAL LOW (ref 150–400)
RBC: 4.37 MIL/uL (ref 4.22–5.81)
RDW: 13.6 % (ref 11.5–15.5)
WBC: 8.2 K/uL (ref 4.0–10.5)

## 2016-01-17 LAB — COMPREHENSIVE METABOLIC PANEL WITH GFR
ALT: 15 U/L — ABNORMAL LOW (ref 17–63)
AST: 23 U/L (ref 15–41)
Albumin: 3.2 g/dL — ABNORMAL LOW (ref 3.5–5.0)
Alkaline Phosphatase: 53 U/L (ref 38–126)
Anion gap: 9 (ref 5–15)
BUN: 24 mg/dL — ABNORMAL HIGH (ref 6–20)
CO2: 26 mmol/L (ref 22–32)
Calcium: 9.7 mg/dL (ref 8.9–10.3)
Chloride: 106 mmol/L (ref 101–111)
Creatinine, Ser: 1.32 mg/dL — ABNORMAL HIGH (ref 0.61–1.24)
GFR calc Af Amer: 53 mL/min — ABNORMAL LOW
GFR calc non Af Amer: 46 mL/min — ABNORMAL LOW
Glucose, Bld: 112 mg/dL — ABNORMAL HIGH (ref 65–99)
Potassium: 4.7 mmol/L (ref 3.5–5.1)
Sodium: 141 mmol/L (ref 135–145)
Total Bilirubin: 1 mg/dL (ref 0.3–1.2)
Total Protein: 5.5 g/dL — ABNORMAL LOW (ref 6.5–8.1)

## 2016-01-17 LAB — URINE MICROSCOPIC-ADD ON

## 2016-01-17 LAB — TROPONIN I: TROPONIN I: 0.03 ng/mL (ref <0.031)

## 2016-01-17 LAB — I-STAT TROPONIN, ED
Troponin i, poc: 0.02 ng/mL (ref 0.00–0.08)
Troponin i, poc: 0 ng/mL (ref 0.00–0.08)

## 2016-01-17 LAB — BRAIN NATRIURETIC PEPTIDE: B Natriuretic Peptide: 218.3 pg/mL — ABNORMAL HIGH (ref 0.0–100.0)

## 2016-01-17 MED ORDER — LORATADINE 10 MG PO TABS
10.0000 mg | ORAL_TABLET | Freq: Every day | ORAL | Status: DC
  Administered 2016-01-17 – 2016-01-20 (×4): 10 mg via ORAL
  Filled 2016-01-17 (×4): qty 1

## 2016-01-17 MED ORDER — APIXABAN 2.5 MG PO TABS
2.5000 mg | ORAL_TABLET | Freq: Two times a day (BID) | ORAL | Status: DC
  Administered 2016-01-17 – 2016-01-20 (×6): 2.5 mg via ORAL
  Filled 2016-01-17 (×6): qty 1

## 2016-01-17 MED ORDER — TERAZOSIN HCL 1 MG PO CAPS
1.0000 mg | ORAL_CAPSULE | Freq: Every day | ORAL | Status: DC
  Administered 2016-01-18 – 2016-01-20 (×3): 1 mg via ORAL
  Filled 2016-01-17 (×3): qty 1

## 2016-01-17 MED ORDER — TAMSULOSIN HCL 0.4 MG PO CAPS
0.4000 mg | ORAL_CAPSULE | Freq: Every day | ORAL | Status: DC
  Administered 2016-01-18 – 2016-01-20 (×3): 0.4 mg via ORAL
  Filled 2016-01-17 (×3): qty 1

## 2016-01-17 MED ORDER — BUPROPION HCL 75 MG PO TABS
75.0000 mg | ORAL_TABLET | Freq: Two times a day (BID) | ORAL | Status: DC
  Administered 2016-01-17 – 2016-01-20 (×6): 75 mg via ORAL
  Filled 2016-01-17 (×7): qty 1

## 2016-01-17 MED ORDER — OXYBUTYNIN CHLORIDE 5 MG PO TABS: 5.0000 mg | ORAL_TABLET | Freq: Two times a day (BID) | ORAL | Status: DC

## 2016-01-17 MED ORDER — ONDANSETRON HCL 4 MG/2ML IJ SOLN
4.0000 mg | Freq: Four times a day (QID) | INTRAMUSCULAR | Status: DC | PRN
Start: 2016-01-17 — End: 2016-01-17

## 2016-01-17 MED ORDER — POLYETHYLENE GLYCOL 3350 17 G PO PACK: 17.0000 g | PACK | Freq: Every day | ORAL | Status: DC | PRN

## 2016-01-17 MED ORDER — NITROGLYCERIN 0.4 MG SL SUBL: 0.4000 mg | SUBLINGUAL_TABLET | SUBLINGUAL | Status: DC | PRN

## 2016-01-17 MED ORDER — POLYVINYL ALCOHOL 1.4 % OP SOLN
4.0000 [drp] | Freq: Four times a day (QID) | OPHTHALMIC | Status: DC
  Administered 2016-01-17 – 2016-01-20 (×10): 4 [drp] via OPHTHALMIC
  Filled 2016-01-17: qty 15

## 2016-01-17 MED ORDER — LATANOPROST 0.005 % OP SOLN
1.0000 [drp] | Freq: Every day | OPHTHALMIC | Status: DC
  Administered 2016-01-17 – 2016-01-19 (×3): 1 [drp] via OPHTHALMIC
  Filled 2016-01-17 (×2): qty 2.5

## 2016-01-17 MED ORDER — ESCITALOPRAM OXALATE 10 MG PO TABS: 10.0000 mg | ORAL_TABLET | ORAL | Status: DC

## 2016-01-17 MED ORDER — HYDRALAZINE HCL 20 MG/ML IJ SOLN
5.0000 mg | INTRAMUSCULAR | Status: DC | PRN
  Administered 2016-01-18 – 2016-01-19 (×3): 5 mg via INTRAVENOUS
  Filled 2016-01-17 (×3): qty 1

## 2016-01-17 MED ORDER — PANTOPRAZOLE SODIUM 40 MG PO TBEC
40.0000 mg | DELAYED_RELEASE_TABLET | Freq: Every day | ORAL | Status: DC
  Administered 2016-01-17 – 2016-01-20 (×4): 40 mg via ORAL
  Filled 2016-01-17 (×4): qty 1

## 2016-01-17 MED ORDER — FUROSEMIDE 40 MG PO TABS: 60.0000 mg | ORAL_TABLET | Freq: Every day | ORAL | Status: DC

## 2016-01-17 MED ORDER — ADULT MULTIVITAMIN W/MINERALS CH
1.0000 | ORAL_TABLET | Freq: Every day | ORAL | Status: DC
  Administered 2016-01-17 – 2016-01-19 (×3): 1 via ORAL
  Filled 2016-01-17 (×5): qty 1

## 2016-01-17 MED ORDER — ACETAMINOPHEN 500 MG PO TABS: 1000.0000 mg | ORAL_TABLET | Freq: Four times a day (QID) | ORAL | Status: DC | PRN

## 2016-01-17 MED ORDER — PRAVASTATIN SODIUM 40 MG PO TABS
40.0000 mg | ORAL_TABLET | Freq: Every day | ORAL | Status: DC
  Administered 2016-01-18 – 2016-01-20 (×3): 40 mg via ORAL
  Filled 2016-01-17 (×3): qty 1

## 2016-01-17 MED ORDER — AMIODARONE HCL 200 MG PO TABS
200.0000 mg | ORAL_TABLET | Freq: Two times a day (BID) | ORAL | Status: DC
  Administered 2016-01-17 – 2016-01-20 (×6): 200 mg via ORAL
  Filled 2016-01-17 (×6): qty 1

## 2016-01-17 MED ORDER — DOCUSATE SODIUM 100 MG PO CAPS: 100.0000 mg | ORAL_CAPSULE | Freq: Two times a day (BID) | ORAL | Status: DC | PRN

## 2016-01-17 MED ORDER — LEVOFLOXACIN 500 MG PO TABS
500.0000 mg | ORAL_TABLET | Freq: Every day | ORAL | Status: DC
  Administered 2016-01-18 – 2016-01-20 (×3): 500 mg via ORAL
  Filled 2016-01-17 (×3): qty 1

## 2016-01-17 MED ORDER — ACETAMINOPHEN 325 MG PO TABS: 650.0000 mg | ORAL_TABLET | ORAL | Status: DC | PRN

## 2016-01-17 MED ORDER — GI COCKTAIL ~~LOC~~: 30.0000 mL | Freq: Four times a day (QID) | ORAL | Status: DC | PRN

## 2016-01-17 MED ORDER — POTASSIUM CHLORIDE ER 10 MEQ PO TBCR
20.0000 meq | EXTENDED_RELEASE_TABLET | Freq: Every day | ORAL | Status: DC
Start: 2016-01-18 — End: 2016-01-20
  Administered 2016-01-18 – 2016-01-20 (×3): 20 meq via ORAL
  Filled 2016-01-17 (×6): qty 2

## 2016-01-17 NOTE — ED Notes (Signed)
Dr. Rhunette CroftNanavati made aware that patient had CBC drawn earlier and that patient has had two istat troponins, MD advised current lab orders can be cancelled.

## 2016-01-17 NOTE — H&P (Signed)
[facility]                                                                                    Eugene [name], is a 82 y.o. male  MRN: [id number]   DOB - [birth year]  Admit Date - 01/17/2016  Outpatient Primary MD for the patient is Florentina Jenny, MD  Referring MD: Rhunette Croft / ER  PMH: Past Medical History  Diagnosis Date  . Glaucoma     Right eye  . Diverticulosis     multiple hospital admissions for GI bleed, has an episode roughly every 6 months,, last  colonscopy in 2006 showed diverticulosis, was evaluated by Dr. Madilyn Fireman in the hospital in 2011 for lower GI bleed andd it  was suggested that the patient was not actively bleeding at that time and given his comorbid conditions colonoscopy was deferred. He is also chronically constipated  . Macular degeneration of right eye   . Allergic rhinitis   . CAD (coronary artery disease)     a. h/o remote inferior MI, h/o PCI to RCA in 1991. b. syncope 2013: severe AS and high grade prox LAD and other diffuse disease. High risk for CABG - s/p BMS to prox LAD and balloon aortic valvuloplasty. c. cath 07/16/14 - diffuse CAD, managed medically.  . Hypertension   . Hyperlipidemia   . Chronic combined systolic and diastolic CHF (congestive heart failure) (HCC)     a. Most recent echo 08/2014: EF 30-35%.  . Supraventricular tachycardia (HCC)     2 syndromes-nonsustained atrial tachycardia//adenosine responsive diuretic positive reentry probably AV node reentry  . Aortic stenosis     a. s/p AV balloon valvuloplasty by Dr. Excell Seltzer 11/2011. b. s/p TAVR 08/2014.  . Ischemic cardiomyopathy     a. 2D ECHO 11/2014: EF of 20-25%  . H/O: GI bleed   . Hypothyroidism   . GERD (gastroesophageal reflux disease)   . Falls frequently     3 times in the past week/notes 09/11/2013  . PAF (paroxysmal atrial fibrillation) (HCC)     a. on amiodarone and eliquis   . Peripheral vascular disease (HCC)     a. h/o ulcer on the left great toe s/p  angioplasty of the left anterior tibial artery.  . OSA on CPAP   . Limited mobility   . S/P TAVR (transcatheter aortic valve replacement)     a. 08/2014: 29 mm Edwards Sapien XT transcatheter heart valve placed via open left transfemoral approach  . PAT (paroxysmal atrial tachycardia) (HCC)   . Atrial flutter (HCC)       PSH: Past Surgical History  Procedure Laterality Date  . Glaucoma surgery      Implantation of Baerveldt glaucoma device lant with scleral reinforcement using tutoplast tissue graft right eye. [Other]  . Coronary angioplasty with stent placement  Jan. 23, 2013  . Corneal transplant Right   . Angioplasty Left 03/30/2013    tibial  . Tonsillectomy  1946  . Cataract extraction w/ intraocular lens  implant, bilateral Bilateral   . Cardiac valve replacement  11/30/2011    aortic valve.?  . Transcatheter aortic valve replacement, transfemoral N/A  08/10/2014    Procedure: TRANSCATHETER AORTIC VALVE REPLACEMENT, TRANSFEMORAL;  Surgeon: Tonny Bollman, MD;  Location: [facility];  Service: Open Heart Surgery;  Laterality: N/A;  . Tee without cardioversion N/A 08/10/2014    Procedure: TRANSESOPHAGEAL ECHOCARDIOGRAM (TEE);  Surgeon: Tonny Bollman, MD;  Location: [facility] OR;  Service: Open Heart Surgery;  Laterality: N/A;  . Coronary angiogram  11/02/2011    Procedure: CORONARY ANGIOGRAM;  Surgeon: Rollene Rotunda, MD;  Location: [facility] CATH LAB;  Service: Cardiovascular;;  . Left heart catheterization with coronary angiogram N/A 11/12/2011    Procedure: LEFT HEART CATHETERIZATION WITH CORONARY ANGIOGRAM;  Surgeon: Tonny Bollman, MD;  Location: [facility] CATH LAB;  Service: Cardiovascular;  Laterality: N/A;  . Percutaneous coronary stent intervention (pci-s) N/A 11/14/2011    Procedure: PERCUTANEOUS CORONARY STENT INTERVENTION (PCI-S);  Surgeon: Tonny Bollman, MD;  Location: [facility] CATH LAB;  Service: Cardiovascular;  Laterality: N/A;  . Abdominal aortagram N/A 03/30/2013    Procedure: ABDOMINAL Ronny Flurry;   Surgeon: Chuck Hint, MD;  Location[facility] C[facility] LAB;  Service: Cardiovascular;  Laterality: N/A;  . Left and right heart catheterization with coronary angiogram N/A 07/16/2014    Procedure: LEFT AND RIGHT HEART CATHETERIZATION WITH CORONARY ANGIOGRAM;  Surgeon: Micheline Chapman, MD;  [facility] C[facility] LAB;  Service: Cardiovascular;  Laterality: N/A;     CC:  Chief Complaint  Patient presents with  . Shortness of Breath  . Chest Pain     HPI: 82 year old male patient resident of assisted living facility who presents with exertional chest pain. Patient has a history of CAD (severe RCA dz) and exertional chest discomfort that typically resolves with rest. He reports that his cardiologist Dr. Excell Seltzer told him anytime he was walking and developed chest pain to just sit down and rest. Patient also has a history of ischemic cardiomyopathy with combined systolic and diastolic heart failure last ejection fraction 35-40% on echocardiogram 2016, in addition he has chronic venous insufficiency, history of TAVR, Urinary retention, chronic atrial fibrillation on eliquis, dyslipidemia, sleep apnea, BPH, hypothyroidism and peripheral vascular disease. Patient currently is chest pain-free after receiving sublingual nitroglycerin per EMS.  ER Evaluation and treatment: Temp 98.3 initial BP 94/69 (after nitroglycerin per EMS) pulse 81 respirations 16, follow-up blood pressures have been as high as 162/85 with most recent blood pressure 109/98 EKG: Sinus rhythm with ventricular rate 85 bpm, QTC 478 ms, nonspecific intraventricular conduction delay, no ST segment or T-wave changes that would be concerning for acute ischemic changes and EKG unchanged from previous 2 View CXR: No acute cardiopulmonary changes and noted stable moderate to severe cardiomegaly Lab data: Sodium 141, potassium 4.7, BUN 24 and creatinine 1.32, glucose 112, BNP 218, troponin point-of-care negative 2 collections, WBCs 8200 no differential  obtained, hemoglobin 13.5, platelets 131,000 Ebling will nitroglycerin 1 per EMS  Review of Systems   In addition to the HPI above,  No Fever-chills, myalgias or other constitutional symptoms No Headache, changes with Vision or hearing, new weakness, tingling, numbness in any extremity, No problems swallowing food or Liquids, indigestion/reflux No Cough or Shortness of Breath, palpitations, orthopnea or DOE No Abdominal pain, N/V; no melena or hematochezia, no dark tarry stools, Bowel movements are regular, No dysuria, hematuria or flank pain No new skin rashes, lesions, masses or bruises, No new joints pains-aches No recent weight gain or loss No polyuria, polydypsia or polyphagia,  *A full 10 point Review of Systems was done, except as stated above, all other Review of Systems were negative.  Social  History Social History  Substance Use Topics  . Smoking status: Former Smoker -- 3.00 packs/day for 21 years    Types: Cigarettes    Quit date: 10/08/1958  . Smokeless tobacco: Never Used  . Alcohol Use: No    Resides at: [facility]  Lives with: N/A  Ambulatory status: She reports utilizes both cane and walker   Family History Family History  Problem Relation Age of Onset  . Cancer Mother     Ovarian Cancer  . Cancer Father     Lung  . Heart disease Sister     Heart disease before age 43  . Stroke Sister   . Heart attack Neg Hx      Prior to Admission medications   Medication Sig Start Date End Date Taking? Authorizing Provider  acetaminophen (TYLENOL) 500 MG tablet Take 1,000 mg by mouth every 6 (six) hours as needed (pain).   Yes Historical Provider, MD  amiodarone (PACERONE) 200 MG tablet Take 1 tablet (200 mg total) by mouth 2 (two) times daily. 12/02/14  Yes Janetta Hora, PA-C  apixaban (ELIQUIS) 2.5 MG TABS tablet Take 1 tablet (2.5 mg total) by mouth 2 (two) times daily. 12/02/14  Yes Janetta Hora, PA-C  buPROPion  (WELLBUTRIN) 75 MG tablet Take 75 mg by mouth 2 (two) times daily.   Yes Historical Provider, MD  Calcium Carb-Cholecalciferol (CALCIUM 600+D) 600-800 MG-UNIT TABS Take 1 tablet by mouth daily.   Yes Historical Provider, MD  carboxymethylcellulose (REFRESH) 1 % ophthalmic solution Place 4 drops into both eyes 4 (four) times daily.   Yes Historical Provider, MD  cetirizine (ZYRTEC) 10 MG tablet Take 10 mg by mouth daily.   Yes Historical Provider, MD  docusate sodium (COLACE) 100 MG capsule Take 100 mg by mouth 2 (two) times daily as needed for mild constipation (constipation).   Yes Historical Provider, MD  escitalopram (LEXAPRO) 10 MG tablet Take 10 mg by mouth every other day. For 4 days   Yes Historical Provider, MD  furosemide (LASIX) 40 MG tablet Take 1 tablet (40 mg total) by mouth daily. Patient taking differently: Take 60 mg by mouth daily.  12/02/14  Yes Janetta Hora, PA-C  levofloxacin (LEVAQUIN) 500 MG tablet Take 500 mg by mouth daily. For 10 days. Started on 4-7   Yes Historical Provider, MD  Multiple Vitamin (MULTIVITAMIN) tablet Take 1 tablet by mouth at bedtime.   Yes Historical Provider, MD  Multiple Vitamins-Minerals (PRESERVISION AREDS PO) Take 1 capsule by mouth 2 (two) times daily.   Yes Historical Provider, MD  nitroGLYCERIN (NITROSTAT) 0.4 MG SL tablet Place 1 tablet (0.4 mg total) under the tongue every 5 (five) minutes x 3 doses as needed for chest pain. 12/02/14  Yes Janetta Hora, PA-C  omeprazole (PRILOSEC) 20 MG capsule Take 20 mg by mouth daily.   Yes Historical Provider, MD  polyethylene glycol (MIRALAX / GLYCOLAX) packet Take 17 g by mouth as needed. Take one packet mixed with liquid by mouth on Monday, Wednesday, and Friday   Yes Historical Provider, MD  potassium chloride (K-DUR) 10 MEQ tablet Take 2 tablets (20 mEq total) by mouth daily. 12/02/14  Yes Janetta Hora, PA-C  pravastatin (PRAVACHOL) 40 MG tablet Take 1 tablet (40 mg total) by mouth daily.  06/17/14  Yes Nischal Narendra, MD  rifampin (RIFADIN) 150 MG capsule Take 150 mg by mouth daily.   Yes Historical Provider, MD  Skin Protectants, Misc. (BAZA PROTECT EX)  Apply 1 application topically 3 (three) times daily.   Yes Historical Provider, MD  tamsulosin (FLOMAX) 0.4 MG CAPS capsule Take 1 capsule (0.4 mg total) by mouth daily. 12/02/14  Yes Janetta Hora, PA-C  terazosin (HYTRIN) 1 MG capsule Take 1 mg by mouth daily.   Yes Historical Provider, MD  traMADol (ULTRAM) 50 MG tablet Take 50 mg by mouth every 6 (six) hours as needed (pain).   Yes Historical Provider, MD  Travoprost, BAK Free, (TRAVATAN) 0.004 % SOLN ophthalmic solution Place 1 drop into the right eye at bedtime.   Yes Historical Provider, MD  cephALEXin (KEFLEX) 500 MG capsule Take 1 capsule (500 mg total) by mouth 4 (four) times daily. Patient not taking: Reported on 07/12/2015 06/27/15   Leta Baptist, MD  levothyroxine (SYNTHROID, LEVOTHROID) 25 MCG tablet TAKE 1 TABLET BY MOUTH EVERY DAY Patient not taking: Reported on 07/12/2015 02/09/15   Earl Lagos, MD  oxybutynin (DITROPAN) 5 MG tablet Take 5 mg by mouth 2 (two) times daily.    Historical Provider, MD    Allergies  Allergen Reactions  . Corn-Containing Products Other (See Comments)    Trouble digesting  . Penicillins Swelling and Other (See Comments)    REACTION: "turns red"    Physical Exam  Vitals  Blood pressure 109/98, pulse 64, temperature 98 F (36.7 C), temperature source Oral, resp. rate 18, SpO2 97 %.   General:  In no acute distress, appears healthy and well nourishedAnd younger than stated age  Psych:  Normal affect, Denies Suicidal or Homicidal ideations, Awake Alert, Oriented X 3. Speech and thought patterns are clear and appropriate  Neuro:   No focal neurological deficits, CN II through XII intact, Strength 4-5/5 all 4 extremities, Sensation intact all 4 extremities.  ENT:  Ears and Eyes appear Normal, Conjunctivae clear, PER.  Moist oral mucosa without erythema or exudates.  Neck:  Supple, No lymphadenopathy appreciated  Respiratory:  Symmetrical chest wall movement, Good air movement bilaterally, CTAB. Room Air  Cardiac:  RRR, No Murmurs, nonpitting soft bilateral LE edema noted, no JVD, No carotid bruits, peripheral pulses palpable at 2+  Abdomen:  Positive bowel sounds, Soft, Non tender, Non distended,  No masses appreciated, no obvious hepatosplenomegaly  Skin:  No Cyanosis, Normal Skin Turgor, No Skin Rash or Bruise.  Extremities: Symmetrical without obvious trauma or injury,  no effusions.  Data Review  CBC  Recent Labs Lab 01/17/16 1151  WBC 8.2  HGB 13.5  HCT 41.1  PLT 131*  MCV 94.1  MCH 30.9  MCHC 32.8  RDW 13.6    Chemistries   Recent Labs Lab 01/17/16 1151  NA 141  K 4.7  CL 106  CO2 26  GLUCOSE 112*  BUN 24*  CREATININE 1.32*  CALCIUM 9.7  AST 23  ALT 15*  ALKPHOS 53  BILITOT 1.0    CrCl cannot be calculated (Unknown ideal weight.).  No results for input(s): TSH, T4TOTAL, T3FREE, THYROIDAB in the last 72 hours.  Invalid input(s): FREET3  Coagulation profile No results for input(s): INR, PROTIME in the last 168 hours.  No results for input(s): DDIMER in the last 72 hours.  Cardiac Enzymes No results for input(s): CKMB, TROPONINI, MYOGLOBIN in the last 168 hours.  Invalid input(s): CK  Invalid input(s): POCBNP  Urinalysis    Component Value Date/Time   COLORURINE YELLOW 07/12/2015 2120   APPEARANCEUR CLEAR 07/12/2015 2120   LABSPEC 1.017 07/12/2015 2120   PHURINE 6.5 07/12/2015 2120  GLUCOSEU NEGATIVE 07/12/2015 2120   HGBUR NEGATIVE 07/12/2015 2120   BILIRUBINUR NEGATIVE 07/12/2015 2120   KETONESUR NEGATIVE 07/12/2015 2120   PROTEINUR NEGATIVE 07/12/2015 2120   UROBILINOGEN 1.0 07/12/2015 2120   NITRITE NEGATIVE 07/12/2015 2120   LEUKOCYTESUR NEGATIVE 07/12/2015 2120    Imaging results:   Dg Chest 2 View  01/17/2016  CLINICAL DATA:   [age] year old male with left side chest pain today while at rehab. Initial encounter. EXAM: CHEST  2 VIEW COMPARISON:  Report of 06/27/2015 chest radiographs (no images available). FINDINGS: Upright AP and lateral views of the chest. Moderate to severe cardiomegaly. Sequelae of cardiac valve replacement. Tortuous thoracic aorta. Other mediastinal contours are within normal limits. Hypo ventilation at the lung bases with no pneumothorax, pulmonary edema, pleural effusion or confluent pulmonary opacity. No acute osseous abnormality identified. Calcified aortic atherosclerosis. IMPRESSION: Moderate to severe cardiomegaly. No acute cardiopulmonary abnormality. Electronically Signed   By: Odessa FlemingH  Hall M.D.   On: 01/17/2016 13:06     EKG: (Independently reviewed)  Sinus rhythm with ventricular rate 85 bpm, QTC 478 ms, nonspecific intraventricular conduction delay, no ST segment or T-wave changes that would be concerning for acute ischemic changes and EKG unchanged from previous   Assessment & Plan  Principal Problem:   Chest pain/known CAD -Patient has known chronic exertional chest pain typically relieved with rest alone but today had substernal nonradiating chest pain without any associated symptoms that did not relieve with rest and was only relieved with sublingual nitroglycerin -Has known severe RCA disease with medical management only per cardiology -Admit to telemetry/Obs -Cycle troponin -Echocardiogram -prn sublingual nitroglycerin -Continue preadmission statin-aspirin previously discontinued with initiation of eliquis -Of note patient currently on Levaquin since 4/6 for percent pneumonia although admission chest x-ray unremarkable-uncertain if recent coughing/URI symptoms caused chest pain; was nontender with palpation over anterior chest wall  Active Problems:   Chronic combined systolic and diastolic CHF, NYHA class 1 /  Ischemic cardiomyopathy -Currently appears compensated for continue  preadmission Lasix -Because of chronic renal insufficiency is not on ACE inhibitor    Chronic venous insufficiency -Has chronic nonpitting lower extremity edema which is stable    S/P TAVR (transcatheter aortic valve replacement)    Hypertension -Current blood pressure well controlled -On Hytrin as well as Flomax for BPH none known antihypertensives    Chronic atrial fibrillation  -Continue eliquis and amiodarone    Pharmacy medication check -Medication list from facility shows patient has been on rifampin alone since February 2017 but unable to locate any associated medical diagnoses that would correlate with need for this medication -Rifampin been discontinued upon admission -Pharmacy to contact nursing facility to clarify her reason why patient has been on rifampin    Hyperlipidemia -Continue Pravachol    Obstructive sleep apnea     PVD (peripheral vascular disease)     Hypothyroidism -Continue Synthroid    BPH (benign prostatic hyperplasia) -Continue Ditropan, Hytrin and Flomax    DVT Prophylaxis: Eliquis  Family Communication:   No family at bedside  Code Status:  Full code  Condition:  Stable  Discharge disposition: Anticipate return to assisted living facility once medically stable  Time spent in minutes : 60      ELLIS,ALLISON L. ANP on 01/17/2016 at 5:20 PM  You may contact me by going to [email or web address] - password [email or web address]  I am available from 7a-7p but please confirm I am on the schedule by going to Amion as above.   After 7p please contact night  coverage person covering me after hours  [facility]

## 2016-01-17 NOTE — ED Provider Notes (Addendum)
CSN: [id number]     Arrival date & time 01/17/16  1121 History   First MD Initiated Contact with Patient 01/17/16 1224     Chief Complaint  Patient presents with  . Shortness of Breath  . Chest Pain     (Consider location/radiation/quality/duration/timing/severity/associated sxs/prior Treatment) HPI Comments: 82 y.o. male who presents for follow-up of aortic valve disease, chronic diastolic and systolic heart failure, and CAD comes in with cc of chest pain and dib. Pt reports that he had chest pain earlier today with associated dib. Pt was with speech pathologist when the pain started, and it lasted for few minutes, until resolution post nitro and aspirin. Pt had associated nausea. Nursing home confirms the story. Pt reports that he has had some chest discomfort in the recent past with exertion/excercise. Chest discomfort is described as heaviness.   ROS 10 Systems reviewed and are negative for acute change except as noted in the HPI.     Patient is a 82 y.o. male presenting with shortness of breath and chest pain. The history is provided by the patient.  Shortness of Breath Associated symptoms: chest pain   Chest Pain Associated symptoms: shortness of breath     Past Medical History  Diagnosis Date  . Glaucoma     Right eye  . Diverticulosis     multiple hospital admissions for GI bleed, has an episode roughly every 6 months,, last  colonscopy in 2006 showed diverticulosis, was evaluated by Dr. Madilyn Fireman in the hospital in 2011 for lower GI bleed andd it  was suggested that the patient was not actively bleeding at that time and given his comorbid conditions colonoscopy was deferred. He is also chronically constipated  . Macular degeneration of right eye   . Allergic rhinitis   . CAD (coronary artery disease)     a. h/o remote inferior MI, h/o PCI to RCA in 1991. b. syncope 2013: severe AS and high grade prox LAD and other diffuse disease. High risk for CABG - s/p BMS to prox LAD and  balloon aortic valvuloplasty. c. cath 07/16/14 - diffuse CAD, managed medically.  . Hypertension   . Hyperlipidemia   . Chronic combined systolic and diastolic CHF (congestive heart failure) (HCC)     a. Most recent echo 08/2014: EF 30-35%.  . Supraventricular tachycardia (HCC)     2 syndromes-nonsustained atrial tachycardia//adenosine responsive diuretic positive reentry probably AV node reentry  . Aortic stenosis     a. s/p AV balloon valvuloplasty by Dr. Excell Seltzer 11/2011. b. s/p TAVR 08/2014.  . Ischemic cardiomyopathy     a. 2D ECHO 11/2014: EF of 20-25%  . H/O: GI bleed   . Hypothyroidism   . GERD (gastroesophageal reflux disease)   . Falls frequently     3 times in the past week/notes 09/11/2013  . PAF (paroxysmal atrial fibrillation) (HCC)     a. on amiodarone and eliquis   . Peripheral vascular disease (HCC)     a. h/o ulcer on the left great toe s/p angioplasty of the left anterior tibial artery.  . OSA on CPAP   . Limited mobility   . S/P TAVR (transcatheter aortic valve replacement)     a. 08/2014: 29 mm Edwards Sapien XT transcatheter heart valve placed via open left transfemoral approach  . PAT (paroxysmal atrial tachycardia) (HCC)   . Atrial flutter [facility]    Past Surgical History  Procedure Laterality Date  . Glaucoma surgery      Implantation of Baerveldt  glaucoma device lant with scleral reinforcement using tutoplast tissue graft right eye. [Other]  . Coronary angioplasty with stent placement  Jan. 23, 2013  . Corneal transplant Right   . Angioplasty Left 03/30/2013    tibial  . Tonsillectomy  1946  . Cataract extraction w/ intraocular lens  implant, bilateral Bilateral   . Cardiac valve replacement  11/30/2011    aortic valve.?  . Transcatheter aortic valve replacement, transfemoral N/A 08/10/2014    Procedure: TRANSCATHETER AORTIC VALVE REPLACEMENT, TRANSFEMORAL;  Surgeon: Tonny BollmanMichael Cooper, MD;  [facility];  Service: Open Heart Surgery;  Laterality: N/A;  . Tee  without cardioversion N/A 08/10/2014    Procedure: TRANSESOPHAGEAL ECHOCARDIOGRAM (TEE);  Surgeon: Tonny BollmanMichael Cooper, MD;  [facility];  Service: Open Heart Surgery;  Laterality: N/A;  . Coronary angiogram  11/02/2011    Procedure: CORONARY ANGIOGRAM;  Surgeon: Rollene RotundaJames Hochrein, MD;  [facility];  Service: Cardiovascular;;  . Left heart catheterization with coronary angiogram N/A 11/12/2011    Procedure: LEFT HEART CATHETERIZATION WITH CORONARY ANGIOGRAM;  Surgeon: Tonny BollmanMichael Cooper, MD;  [facility];  Service: Cardiovascular;  Laterality: N/A;  . Percutaneous coronary stent intervention (pci-s) N/A 11/14/2011    Procedure: PERCUTANEOUS CORONARY STENT INTERVENTION (PCI-S);  Surgeon: Tonny BollmanMichael Cooper, MD;  [facility];  Service: Cardiovascular;  Laterality: N/A;  . Abdominal aortagram N/A 03/30/2013    Procedure: ABDOMINAL Ronny FlurryAORTAGRAM;  Surgeon: Chuck Hinthristopher S Dickson, MD;  [facility];  Service: Cardiovascular;  Laterality: N/A;  . Left and right heart catheterization with coronary angiogram N/A 07/16/2014    Procedure: LEFT AND RIGHT HEART CATHETERIZATION WITH CORONARY ANGIOGRAM;  Surgeon: Micheline ChapmanMichael D Cooper, MD;  [facility];  Service: Cardiovascular;  Laterality: N/A;   Family History  Problem Relation Age of Onset  . Cancer Mother     Ovarian Cancer  . Cancer Father     Lung  . Heart disease Sister     Heart disease before age 80  . Stroke Sister   . Heart attack Neg Hx    Social History  Substance Use Topics  . Smoking status: Former Smoker -- 3.00 packs/day for 21 years    Types: Cigarettes    Quit date: 10/08/1958  . Smokeless tobacco: Never Used  . Alcohol Use: No    Review of Systems  Respiratory: Positive for shortness of breath.   Cardiovascular: Positive for chest pain.      Allergies  Corn-containing products and Penicillins  Home Medications   Prior to Admission medications   Medication Sig Start Date End Date Taking?  Authorizing Provider  acetaminophen (TYLENOL) 500 MG tablet Take 1,000 mg by mouth every 6 (six) hours as needed (pain).   Yes Historical Provider, MD  amiodarone (PACERONE) 200 MG tablet Take 1 tablet (200 mg total) by mouth 2 (two) times daily. 12/02/14  Yes Janetta HoraKathryn R Thompson, PA-C  apixaban (ELIQUIS) 2.5 MG TABS tablet Take 1 tablet (2.5 mg total) by mouth 2 (two) times daily. 12/02/14  Yes Janetta HoraKathryn R Thompson, PA-C  buPROPion (WELLBUTRIN) 75 MG tablet Take 75 mg by mouth 2 (two) times daily.   Yes Historical Provider, MD  Calcium Carb-Cholecalciferol (CALCIUM 600+D) 600-800 MG-UNIT TABS Take 1 tablet by mouth daily.   Yes Historical Provider, MD  carboxymethylcellulose (REFRESH) 1 % ophthalmic solution Place 4 drops into both eyes 4 (four) times daily.   Yes Historical Provider, MD  cetirizine (ZYRTEC) 10 MG tablet Take 10 mg by mouth daily.  Yes Historical Provider, MD  Multiple Vitamin (MULTIVITAMIN) tablet Take 1 tablet by mouth at bedtime.   Yes Historical Provider, MD  nitroGLYCERIN (NITROSTAT) 0.4 MG SL tablet Place 1 tablet (0.4 mg total) under the tongue every 5 (five) minutes x 3 doses as needed for chest pain. Patient not taking: Reported on 01/30/2016 12/02/14  Yes Janetta Hora, PA-C  omeprazole (PRILOSEC) 20 MG capsule Take 20 mg by mouth daily.   Yes Historical Provider, MD  polyethylene glycol (MIRALAX / GLYCOLAX) packet Take 17 g by mouth as needed. Take one packet mixed with liquid by mouth on Monday, Wednesday, and Friday   Yes Historical Provider, MD  pravastatin (PRAVACHOL) 40 MG tablet Take 1 tablet (40 mg total) by mouth daily. 06/17/14  Yes Earl Lagos, MD  Skin Protectants, Misc. (BAZA PROTECT EX) Apply 1 application topically 3 (three) times daily.   Yes Historical Provider, MD  tamsulosin (FLOMAX) 0.4 MG CAPS capsule Take 1 capsule (0.4 mg total) by mouth daily. 12/02/14  Yes Janetta Hora, PA-C  terazosin (HYTRIN) 1 MG capsule Take 1 mg by mouth daily.   Yes  Historical Provider, MD  Travoprost, BAK Free, (TRAVATAN) 0.004 % SOLN ophthalmic solution Place 1 drop into the right eye at bedtime.   Yes Historical Provider, MD  furosemide (LASIX) 20 MG tablet Take 60 mg by mouth daily.    Historical Provider, MD  hydrALAZINE (APRESOLINE) 25 MG tablet Take 1 tablet (25 mg total) by mouth 2 (two) times daily. 01/20/16   Ripudeep Jenna Luo, MD  isosorbide mononitrate (IMDUR) 30 MG 24 hr tablet Take 1 tablet (30 mg total) by mouth daily. 01/20/16   Ripudeep Jenna Luo, MD  potassium chloride (K-DUR) 10 MEQ tablet Take 30 mEq by mouth daily.    Historical Provider, MD  traMADol (ULTRAM) 50 MG tablet Take 1 tablet (50 mg total) by mouth every 6 (six) hours as needed for moderate pain or severe pain (pain). Patient not taking: Reported on 01/30/2016 01/20/16   Ripudeep K Rai, MD   BP 159/86 mmHg  Pulse 74  Temp(Src) 98 F (36.7 C) (Oral)  Resp 20  Wt 201 lb 11.2 oz (91.491 kg)  SpO2 94% Physical Exam  Constitutional: He is oriented to person, place, and time. He appears well-developed.  HENT:  Head: Atraumatic.  Neck: Neck supple.  Cardiovascular: Normal rate.   Pulmonary/Chest: Effort normal.  Neurological: He is alert and oriented to person, place, and time.  Skin: Skin is warm.  Nursing note and vitals reviewed.   ED Course  Procedures (including critical care time) Labs Review Labs Reviewed  CBC - Abnormal; Notable for the following:    Platelets 131 (*)    All other components within normal limits  COMPREHENSIVE METABOLIC PANEL - Abnormal; Notable for the following:    Glucose, Bld 112 (*)    BUN 24 (*)    Creatinine, Ser 1.32 (*)    Total Protein 5.5 (*)    Albumin 3.2 (*)    ALT 15 (*)    GFR calc non Af Amer 46 (*)    GFR calc Af Amer 53 (*)    All other components within normal limits  BRAIN NATRIURETIC PEPTIDE - Abnormal; Notable for the following:    B Natriuretic Peptide 218.3 (*)    All other components within normal limits  URINALYSIS,  ROUTINE W REFLEX MICROSCOPIC (NOT AT [facility]) - Abnormal; Notable for the following:    Hgb urine dipstick SMALL (*)  All other components within normal limits  URINE MICROSCOPIC-ADD ON - Abnormal; Notable for the following:    Squamous Epithelial / LPF 0-5 (*)    Bacteria, UA RARE (*)    Casts HYALINE CASTS (*)    All other components within normal limits  BASIC METABOLIC PANEL - Abnormal; Notable for the following:    Glucose, Bld 102 (*)    BUN 23 (*)    Creatinine, Ser 1.28 (*)    GFR calc non Af Amer 48 (*)    GFR calc Af Amer 55 (*)    All other components within normal limits  CBC - Abnormal; Notable for the following:    Platelets 142 (*)    All other components within normal limits  TROPONIN I  TROPONIN I  TROPONIN I  I-STAT TROPOININ, ED  I-STAT TROPOININ, ED    Imaging Review No results found. I have personally reviewed and evaluated these images and lab results as part of my medical decision-making.   EKG Interpretation   Date/Time:  Tuesday January 17 2016 11:32:46 EDT Ventricular Rate:  85 PR Interval:  296 QRS Duration: 125 QT Interval:  402 QTC Calculation: 478 R Axis:   -66 Text Interpretation:  Sinus rhythm Prolonged PR interval Nonspecific IVCD  with LAD Inferior infarct, old No acute changes No significant change  since last tracing Confirmed by Rhunette Croft, MD, Janey Genta [phone]) on 01/17/2016  12:26:03 PM      MDM   Final diagnoses:  Angina pectoris (HCC)    PT comes in with cc of chest pain.  Chest pain is described as heaviness and pt has associated dyspnea. He has CAD hx. CP resolved with nitro. Pt is currently chest pain free and the ekg is not showing any acute changes, but given the hx and the risk factors, best to admit him for chest pain obs.   Derwood Kaplan, MD 01/17/16 1612  Derwood Kaplan, MD 02/09/16 1326

## 2016-01-17 NOTE — ED Notes (Signed)
[facility] [place], ems reports pt is there for speech rehab and began having chest pain and sob. Pt received 1 sl nitro and 324 mg asa pta. Pt also currently being treated for pneumonia. Pt reports pain is now gone. A/o x4, resp e/u, nad.

## 2016-01-18 ENCOUNTER — Observation Stay (HOSPITAL_BASED_OUTPATIENT_CLINIC_OR_DEPARTMENT_OTHER): Payer: Medicare Other

## 2016-01-18 DIAGNOSIS — R0789 Other chest pain: Secondary | ICD-10-CM | POA: Diagnosis not present

## 2016-01-18 DIAGNOSIS — I482 Chronic atrial fibrillation: Secondary | ICD-10-CM | POA: Diagnosis not present

## 2016-01-18 DIAGNOSIS — I5042 Chronic combined systolic (congestive) and diastolic (congestive) heart failure: Secondary | ICD-10-CM | POA: Diagnosis not present

## 2016-01-18 DIAGNOSIS — R079 Chest pain, unspecified: Secondary | ICD-10-CM | POA: Diagnosis not present

## 2016-01-18 DIAGNOSIS — I130 Hypertensive heart and chronic kidney disease with heart failure and stage 1 through stage 4 chronic kidney disease, or unspecified chronic kidney disease: Secondary | ICD-10-CM | POA: Diagnosis not present

## 2016-01-18 LAB — ECHOCARDIOGRAM COMPLETE

## 2016-01-18 LAB — TROPONIN I
TROPONIN I: 0.03 ng/mL (ref <0.031)
Troponin I: 0.03 ng/mL (ref <0.031)

## 2016-01-18 MED ORDER — ISOSORBIDE MONONITRATE ER 30 MG PO TB24
15.0000 mg | ORAL_TABLET | Freq: Every day | ORAL | Status: DC
  Administered 2016-01-18 – 2016-01-19 (×2): 15 mg via ORAL
  Filled 2016-01-18 (×2): qty 1

## 2016-01-18 MED ORDER — FUROSEMIDE 10 MG/ML IJ SOLN
40.0000 mg | Freq: Two times a day (BID) | INTRAMUSCULAR | Status: AC
  Administered 2016-01-18 (×2): 40 mg via INTRAVENOUS
  Filled 2016-01-18 (×2): qty 4

## 2016-01-18 NOTE — Care Management Obs Status (Signed)
MEDICARE OBSERVATION STATUS NOTIFICATION   Patient Details  Name: Barrie Dunkerfird N Magid MRN: [id number] Date of Birth: Mar 27, 1924   Medicare Observation Status Notification Given:  Yes    Darrold SpanWebster, Ransom Nickson Hall, RN 01/18/2016, 11:45 AM

## 2016-01-18 NOTE — NC FL2 (Signed)
[place] MEDICAID FL2 LEVEL OF CARE SCREENING TOOL     IDENTIFICATION  Patient Name: Eugene [name] Birthdate: 12-18-1923 Sex: male Admission Date (Current Location): 01/17/2016  [facility] [place]   Facility and Address:  [facility], [address], [place], [place] [address]      Provider Number: [id number]  Attending Physician Name and Address:  Zannie CovePreetha Bates [place], MD  Relative Name and Phone Number:       Current Level of Care: Hospital Recommended Level of Care: Assisted Living Facility Prior Approval Number:    Date Approved/Denied:   PASRR Number:    Discharge Plan:  (ALF)    Current Diagnoses: Patient Active Problem List   Diagnosis Date Noted  . Chest pain 01/17/2016  . BPH (benign prostatic hyperplasia) 01/17/2016  . Chronic atrial fibrillation (HCC) 01/17/2016  . Pressure ulcer 01/17/2016  . Atypical chest pain   . Risk for falls 08/09/2015  . Hypothyroidism   . H/O: GI bleed   . Ischemic cardiomyopathy   . Hypertension   . Chronic anticoagulation   . Musculoskeletal pain of left thigh 11/15/2014  . Thrombocytopenia, secondary 08/13/2014  . S/P TAVR (transcatheter aortic valve replacement) 08/10/2014  . Limited mobility   . PVD (peripheral vascular disease) (HCC) 05/12/2014  . Chronic combined systolic and diastolic CHF, NYHA class 1 (HCC) 04/08/2014  . Bilateral lower extremity edema 04/08/2014  . Obesity 09/16/2013  . Multiple falls 09/08/2013  . Obstructive sleep apnea 09/08/2013  . Non-productive cough 09/08/2013  . Urge urinary incontinence 07/31/2013  . Nodule, subcutaneous 07/31/2013  . Vitamin B12 deficiency 07/31/2013  . Atherosclerotic PVD with ulceration (HCC) 03/25/2013  . Abnormality of gait 01/12/2013  . Onychomycosis 03/18/2012  . Preventative health care 07/03/2011  . SVT (supraventricular tachycardia) (HCC) 05/01/2011  . CAD (coronary artery disease) 05/01/2011  . Bradycardia   01/17/2011  . Chronic venous insufficiency 12/28/2010  . DIVERTICULOSIS, COLON 07/19/2010  . GLAUCOMA, RIGHT EYE 07/14/2010  . MACULAR DEGENERATION, RIGHT EYE 03/24/2010  . Hyperlipidemia 01/14/2009  . ALLERGIC RHINITIS DUE TO POLLEN 01/14/2009    Orientation RESPIRATION BLADDER Height & Weight     Situation, Time, Self, Place  Normal Incontinent Weight:   Height:     BEHAVIORAL SYMPTOMS/MOOD NEUROLOGICAL BOWEL NUTRITION STATUS   (None)  (none) Continent Diet (Heart healthy )  AMBULATORY STATUS COMMUNICATION OF NEEDS Skin   Limited Assist Verbally PU Stage and Appropriate Care (Stage II of Right Buttocks. Laceration of left buttock)   PU Stage 2 Dressing:  (Foam Dressing PRN)                   Personal Care Assistance Level of Assistance  Feeding, Bathing, Dressing Bathing Assistance: Limited assistance Feeding assistance: Independent Dressing Assistance: Limited assistance     Functional Limitations Info  Sight, Hearing, Speech Sight Info: Adequate Hearing Info: Adequate Speech Info: Adequate    SPECIAL CARE FACTORS FREQUENCY  OT (By licensed OT)     PT Frequency: 3/ week OT Frequency: 3/ week            Contractures Contractures Info: Not present    Additional Factors Info  Code Status, Allergies, Psychotropic Code Status Info: Full Allergies Info: Corn-containing Products, Penicillins Psychotropic Info: Wellbutrin          Current Medications (01/18/2016):  This is the current hospital active medication list Current Facility-Administered Medications  Medication Dose Route Frequency Provider Last Rate Last Dose  .  acetaminophen (TYLENOL) tablet 1,000 mg  1,000 mg Oral Q6H PRN Russella Dar, NP      . acetaminophen (TYLENOL) tablet 650 mg  650 mg Oral Q4H PRN Russella Dar, NP      . amiodarone (PACERONE) tablet 200 mg  200 mg Oral BID Russella Dar, NP   200 mg at 01/18/16 1024  . apixaban (ELIQUIS) tablet 2.5 mg  2.5 mg Oral BID Russella Dar, NP   2.5 mg at 01/18/16 0809  . buPROPion [facility]) tablet 75 mg  75 mg Oral BID Russella Dar, NP   75 mg at 01/18/16 1021  . docusate sodium (COLACE) capsule 100 mg  100 mg Oral BID PRN Russella Dar, NP      . furosemide (LASIX) injection 40 mg  40 mg Intravenous Q12H Zannie Cove, MD   40 mg at 01/18/16 0809  . gi cocktail (Maalox,Lidocaine,Donnatal)  30 mL Oral QID PRN Russella Dar, NP      . hydrALAZINE (APRESOLINE) injection 5 mg  5 mg Intravenous Q4H PRN Leda Gauze, NP   5 mg at 01/18/16 0600  . isosorbide mononitrate (IMDUR) 24 hr tablet 15 mg  15 mg Oral Daily Zannie Cove, MD   15 mg at 01/18/16 1024  . latanoprost (XALATAN) 0.005 % ophthalmic solution 1 drop  1 drop Right Eye QHS Russella Dar, NP   1 drop at 01/17/16 2200  . levofloxacin (LEVAQUIN) tablet 500 mg  500 mg Oral Daily Russella Dar, NP   500 mg at 01/18/16 1024  . loratadine (CLARITIN) tablet 10 mg  10 mg Oral Daily Russella Dar, NP   10 mg at 01/18/16 1024  . multivitamin with minerals tablet 1 tablet  1 tablet Oral QHS Russella Dar, NP   1 tablet at 01/17/16 2122  . nitroGLYCERIN (NITROSTAT) SL tablet 0.4 mg  0.4 mg Sublingual Q5 min PRN Russella Dar, NP      . pantoprazole (PROTONIX) EC tablet 40 mg  40 mg Oral Daily Russella Dar, NP   40 mg at 01/18/16 1024  . polyethylene glycol (MIRALAX / GLYCOLAX) packet 17 g  17 g Oral Daily PRN Russella Dar, NP      . polyvinyl alcohol (LIQUIFILM TEARS) 1.4 % ophthalmic solution 4 drop  4 drop Both Eyes QID Russella Dar, NP   4 drop at 01/18/16 1549  . potassium chloride (K-DUR) CR tablet 20 mEq  20 mEq Oral Daily Russella Dar, NP   20 mEq at 01/18/16 1024  . pravastatin (PRAVACHOL) tablet 40 mg  40 mg Oral Daily Russella Dar, NP   40 mg at 01/18/16 1024  . tamsulosin (FLOMAX) capsule 0.4 mg  0.4 mg Oral Daily Russella Dar, NP   0.4 mg at 01/18/16 1024  . terazosin (HYTRIN) capsule 1 mg  1 mg Oral Daily Russella Dar,  NP   1 mg at 01/18/16 1021     Discharge Medications: Please see discharge summary for a list of discharge medications.  Relevant Imaging Results:  Relevant Lab Results:   Additional Information SSN: [id number]  Venita Lick, LCSW

## 2016-01-18 NOTE — Clinical Social Work Note (Signed)
Clinical Social Work Assessment  Patient Details  Name: Eugene [name] MRN: [id number] Date of Birth: 1924/05/01  Date of referral:  01/18/16               Reason for consult:  Discharge Planning                Permission sought to share information with:  Chartered certified accountant granted to share information::  Yes, Verbal Permission Granted  Name::     Eugene [name] and Eugene [name]::  ALF  Relationship::  [place] Attorney/ Friend - [name], Eugene [name] and Eugene [name] Motel/ Niece   Contact Information:     Housing/Transportation Living arrangements for the past 2 months:  [place] of Information:  Patient, Power of Attorney Patient Interpreter Needed:  None Criminal Activity/Legal Involvement Pertinent to Current Situation/Hospitalization:  No - Comment as needed Significant Relationships:  Friend, Other Family Members Lives with:  Facility Resident Do you feel safe going back to the place where you live?  Yes Need for family participation in patient care:  Yes (Comment)  Care giving concerns:  The patient's supports do not report any care giving concerns at this time. The patient's supports and patient agree that patient needs to return to ALF/ [facility].at discharge.   Social Worker assessment / plan:  CSW met with patient at bedside to complete assessment. The patient  was resting comfortably in bed. The patient reports that he will return to [facility] at discharge. Patient reports that his supports are his niece and Eugene [name].  CSW explained CSW role in assisting patient with transition back to ALF. CSW spoke with patient's [name]. Patient's HPOA is in agreement with patient returning to ALF. CSW will assist with discharge.  Employment status:  Retired Forensic scientist:  Medicare PT Recommendations:  Not assessed at this time [place] / Referral to community resources:   Other (Comment Required) (Information will be sent to [name])  Patient/Family's Response to care:  Patient and patient's HPOA did not express complaints and seemed happy with care being provided.   Patient/Family's Understanding of and Emotional Response to Diagnosis, Current Treatment, and Prognosis:  Patient appears to have fair understanding of the reason for his admission. HPOA and Patient understand that patient will need to return to ALF at discharge.   Emotional Assessment Appearance:  Appears stated age Attitude/Demeanor/Rapport:  Other (Pt was welcoming and appropriate with CSW.) Affect (typically observed):  Calm, Appropriate, Accepting Orientation:  Oriented to Self, Oriented to Place, Oriented to  Time, Oriented to Situation Alcohol / Substance use:  Not Applicable Psych involvement (Current and /or in the community):  No (Comment)  Discharge Needs  Concerns to be addressed:  Discharge Planning Concerns Readmission within the last 30 days:  No Current discharge risk:  Physical Impairment, Chronically ill Barriers to Discharge:  Continued Medical Work up   Eugene Noel, LCSW 01/18/2016, 4:05 PM

## 2016-01-18 NOTE — Progress Notes (Signed)
T [facility] Progress Note                                            Patient Demographics:    Eugene [name], is a 82 y.o. male, DOB - Mar 14, 1924, RUE:[id number]  Admit date - 01/17/2016   Admitting Physician Ozella Rocksavid J Merrell, MD  [facility]patient [facility]  Chief Complaint  Patient presents with  . Shortness of Breath  . Chest Pain        Subjective:    no chest pain, some dyspnea, " my BP is never this high"(was in 190-200 range)   Assessment  & Plan :        Atypical chest pain/CAD -resolved -troponin negative, EGD with inferior Q waves unchanged from prior -CAD with diffuse disease on last cath 10/15,  medical management was recommended by Dr.Cooper -Add low-dose Imdur, since blood pressure uncontrolled should help with this as well -not on ASA-since he takes eliquis, not on BB but HR in 50-60s range  Acute on chronic systolic CHF -His ejection fraction is 35% -Clinically volume overloaded with positive JVD and bilateral crackles -Hold PO Lasix will give 2 doses of IV Lasix today monitor urine output -Follow-up repeat echocardiogram  Severe aortic stenosis  -treated with TAVR in November 2015   Chronic venous insufficiency -Has chronic nonpitting lower extremity edema   Hypertension -Current blood pressure uncontrolled, 190-200 SBP overnight this am -On Hytrin and Flomax for BPH none known antihypertensives -add imdur, should help with CAD/angina too   Chronic atrial fibrillation  -Continue eliquis and amiodarone   Hyperlipidemia -Continue Pravachol   Obstructive sleep apnea    PVD (peripheral vascular disease)    Hypothyroidism -Continue Synthroid   BPH (benign prostatic hyperplasia) -Continue Ditropan, Hytrin and Flomax  DVT proph: on eliquis  Full Code Communication: no family at bedside,  plan d/w pt Dispo: back to ALF in 1-2days   Lab Results  Component Value Date   PLT 131* 01/17/2016   Anti-infectives    Start     Dose/Rate Route Frequency Ordered Stop   01/18/16 1000  levofloxacin (LEVAQUIN) tablet 500 mg     500 mg Oral Daily 01/17/16 1810 01/23/16 0959        Objective:   Filed Vitals:   01/17/16 2200 01/18/16 0432 01/18/16 0545 01/18/16 1021  BP: 151/76 190/86 184/83 120/86  Pulse:  72    Temp:  97.6 F (36.4 C)    TempSrc:  Oral    Resp:  18    SpO2:  97%      Wt Readings from Last 3 Encounters:  07/12/15 91.763 kg (202 lb 4.8 oz)  07/06/15 91.808 kg (202 lb 6.4 oz)  05/13/15 80.74 kg (178 lb)    No intake or output data in the 24 hours ending 01/18/16 1117   Physical Exam  Awake Alert, Oriented X 3, No new F.N deficits, Normal affect HEENT: + JVD, PERRAL Lungs: crackles at both bases CVS: RRR,No Gallops,Rubs or new Murmurs, No Parasternal Heave Abd: soft, No tenderness, No organomegaly appriciated, No rebound - guarding or rigidity. Ext: 1plus edema    Data Review:    CBC  Recent Labs Lab 01/17/16 1151  WBC 8.2  HGB 13.5  HCT 41.1  PLT 131*  MCV 94.1  MCH 30.9  MCHC 32.8  RDW 13.6  Chemistries   Recent Labs Lab 01/17/16 1151  NA 141  K 4.7  CL 106  CO2 26  GLUCOSE 112*  BUN 24*  CREATININE 1.32*  CALCIUM 9.7  AST 23  ALT 15*  ALKPHOS 53  BILITOT 1.0   ------------------------------------------------------------------------------------------------------------------ No results for input(s): CHOL, HDL, LDLCALC, TRIG, CHOLHDL, LDLDIRECT in the last 72 hours.  Lab Results  Component Value Date   HGBA1C 5.2 08/06/2014   ------------------------------------------------------------------------------------------------------------------ No results for input(s): TSH, T4TOTAL, T3FREE, THYROIDAB in the last 72 hours.  Invalid input(s):  FREET3 ------------------------------------------------------------------------------------------------------------------ No results for input(s): VITAMINB12, FOLATE, FERRITIN, TIBC, IRON, RETICCTPCT in the last 72 hours.  Coagulation profile No results for input(s): INR, PROTIME in the last 168 hours.  No results for input(s): DDIMER in the last 72 hours.  Cardiac Enzymes  Recent Labs Lab 01/17/16 2132 01/18/16 0413  TROPONINI 0.03 0.03   ------------------------------------------------------------------------------------------------------------------    Component Value Date/Time   BNP 218.3* 01/17/2016 1151    Inpatient Medications  Scheduled Meds: . amiodarone  200 mg Oral BID  . apixaban  2.5 mg Oral BID  . buPROPion  75 mg Oral BID  . furosemide  40 mg Intravenous Q12H  . isosorbide mononitrate  15 mg Oral Daily  . latanoprost  1 drop Right Eye QHS  . levofloxacin  500 mg Oral Daily  . loratadine  10 mg Oral Daily  . multivitamin with minerals  1 tablet Oral QHS  . pantoprazole  40 mg Oral Daily  . polyvinyl alcohol  4 drop Both Eyes QID  . potassium chloride  20 mEq Oral Daily  . pravastatin  40 mg Oral Daily  . tamsulosin  0.4 mg Oral Daily  . terazosin  1 mg Oral Daily   Continuous Infusions:  PRN Meds:.acetaminophen, acetaminophen, docusate sodium, gi cocktail, hydrALAZINE, nitroGLYCERIN, polyethylene glycol  Micro Results No results found for this or any previous visit (from the past 240 hour(s)).  Radiology Reports Dg Chest 2 View  01/17/2016  CLINICAL DATA:  82 year old male with left side chest pain today while at rehab. Initial encounter. EXAM: CHEST  2 VIEW COMPARISON:  Report of 06/27/2015 chest radiographs (no images available). FINDINGS: Upright AP and lateral views of the chest. Moderate to severe cardiomegaly. Sequelae of cardiac valve replacement. Tortuous thoracic aorta. Other mediastinal contours are within normal limits. Hypo ventilation at  the lung bases with no pneumothorax, pulmonary edema, pleural effusion or confluent pulmonary opacity. No acute osseous abnormality identified. Calcified aortic atherosclerosis. IMPRESSION: Moderate to severe cardiomegaly. No acute cardiopulmonary abnormality. Electronically Signed   By: Odessa Fleming M.D.   On: 01/17/2016 13:06    Time Spent in minutes     Eugene [name] M.D on 01/18/2016 at 11:17 AM  Between 7am to 7pm - Pager - [phone]  After 7pm go to [email or web address] - password [email or web address]  [facility] -  Office  [phone]

## 2016-01-18 NOTE — Progress Notes (Signed)
Echocardiogram 2D Echocardiogram has been performed.  Eugene [name], Eugene [name] M 01/18/2016, 1:29 PM

## 2016-01-18 NOTE — Procedures (Signed)
Pt is sleeping comfortably.  CPAP placed in pt's room for use tomorrow.

## 2016-01-19 DIAGNOSIS — R079 Chest pain, unspecified: Secondary | ICD-10-CM | POA: Diagnosis not present

## 2016-01-19 DIAGNOSIS — I5042 Chronic combined systolic (congestive) and diastolic (congestive) heart failure: Secondary | ICD-10-CM

## 2016-01-19 DIAGNOSIS — I2510 Atherosclerotic heart disease of native coronary artery without angina pectoris: Secondary | ICD-10-CM

## 2016-01-19 DIAGNOSIS — I482 Chronic atrial fibrillation: Secondary | ICD-10-CM

## 2016-01-19 DIAGNOSIS — E785 Hyperlipidemia, unspecified: Secondary | ICD-10-CM

## 2016-01-19 DIAGNOSIS — N400 Enlarged prostate without lower urinary tract symptoms: Secondary | ICD-10-CM | POA: Diagnosis not present

## 2016-01-19 DIAGNOSIS — R0789 Other chest pain: Secondary | ICD-10-CM | POA: Diagnosis not present

## 2016-01-19 DIAGNOSIS — I872 Venous insufficiency (chronic) (peripheral): Secondary | ICD-10-CM

## 2016-01-19 DIAGNOSIS — I130 Hypertensive heart and chronic kidney disease with heart failure and stage 1 through stage 4 chronic kidney disease, or unspecified chronic kidney disease: Secondary | ICD-10-CM | POA: Diagnosis not present

## 2016-01-19 LAB — CBC
HEMATOCRIT: 40.1 % (ref 39.0–52.0)
HEMOGLOBIN: 13 g/dL (ref 13.0–17.0)
MCH: 30.4 pg (ref 26.0–34.0)
MCHC: 32.4 g/dL (ref 30.0–36.0)
MCV: 93.9 fL (ref 78.0–100.0)
Platelets: 142 10*3/uL — ABNORMAL LOW (ref 150–400)
RBC: 4.27 MIL/uL (ref 4.22–5.81)
RDW: 14.1 % (ref 11.5–15.5)
WBC: 8.5 10*3/uL (ref 4.0–10.5)

## 2016-01-19 LAB — BASIC METABOLIC PANEL
ANION GAP: 10 (ref 5–15)
BUN: 23 mg/dL — ABNORMAL HIGH (ref 6–20)
CO2: 25 mmol/L (ref 22–32)
Calcium: 9.1 mg/dL (ref 8.9–10.3)
Chloride: 106 mmol/L (ref 101–111)
Creatinine, Ser: 1.28 mg/dL — ABNORMAL HIGH (ref 0.61–1.24)
GFR, EST AFRICAN AMERICAN: 55 mL/min — AB (ref >60)
GFR, EST NON AFRICAN AMERICAN: 48 mL/min — AB (ref >60)
GLUCOSE: 102 mg/dL — AB (ref 65–99)
POTASSIUM: 3.6 mmol/L (ref 3.5–5.1)
SODIUM: 141 mmol/L (ref 135–145)

## 2016-01-19 MED ORDER — TRAMADOL HCL 50 MG PO TABS: 50.0000 mg | ORAL_TABLET | Freq: Four times a day (QID) | ORAL | Status: DC | PRN

## 2016-01-19 MED ORDER — FUROSEMIDE 40 MG PO TABS
40.0000 mg | ORAL_TABLET | Freq: Every day | ORAL | Status: DC
  Administered 2016-01-19 – 2016-01-20 (×2): 40 mg via ORAL
  Filled 2016-01-19 (×2): qty 1

## 2016-01-19 MED ORDER — HYDRALAZINE HCL 25 MG PO TABS
25.0000 mg | ORAL_TABLET | Freq: Two times a day (BID) | ORAL | Status: DC
  Administered 2016-01-19 – 2016-01-20 (×2): 25 mg via ORAL
  Filled 2016-01-19 (×2): qty 1

## 2016-01-19 MED ORDER — ISOSORBIDE MONONITRATE ER 30 MG PO TB24
30.0000 mg | ORAL_TABLET | Freq: Every day | ORAL | Status: DC
  Administered 2016-01-20: 30 mg via ORAL
  Filled 2016-01-19: qty 1

## 2016-01-19 MED ORDER — ISOSORBIDE MONONITRATE ER 30 MG PO TB24: 15.0000 mg | ORAL_TABLET | Freq: Every day | ORAL | Status: DC

## 2016-01-19 NOTE — Evaluation (Signed)
Physical Therapy Evaluation Patient Details Name: Eugene [name] MRN: [id number] DOB: 1924-06-20 Today's Date: 01/19/2016   History of Present Illness  Pt adm with chest pain and acute on chronic heart failure. PMH - TAVR, afib, chf  Clinical Impression  Pt admitted with above diagnosis and presents to PT with functional limitations due to deficits listed below (See PT problem list). Pt needs skilled PT to maximize independence and safety to allow discharge to ALF. Pt will need 2 person assist at ALF initially. If [facility] can provide this amount of assistance pt can return there.      Follow Up Recommendations Home health PT (At ALF if ALF can provide amount of assist needed)    Equipment Recommendations  None recommended by PT    Recommendations for Other Services       Precautions / Restrictions Precautions Precautions: Fall Restrictions Weight Bearing Restrictions: No      Mobility  Bed Mobility Overal bed mobility: Needs Assistance Bed Mobility: Supine to Sit;Sit to Supine     Supine to sit: Max assist Sit to supine: Max assist   General bed mobility comments: Assist to bring legs off bed and elevate trunk into sitting. Assist to lower trunk and bring legs back up into bed.  Transfers                 General transfer comment: Attempted to stand but pt unable to get hips off bed with 1 person max assist.  Ambulation/Gait                Stairs            Wheelchair Mobility    Modified Rankin (Stroke Patients Only)       Balance Overall balance assessment: Needs assistance Sitting-balance support: Bilateral upper extremity supported;Feet supported Sitting balance-Leahy Scale: Poor Sitting balance - Comments: Sat EOB x 10 minutes with min A Postural control: Left lateral lean                                   Pertinent Vitals/Pain Pain Assessment: No/denies pain    Home Living Family/patient expects to be  discharged to:: Assisted living               Home Equipment: Walker - 2 wheels;Wheelchair - manual;Bedside commode      Prior Function Level of Independence: Needs assistance   Gait / Transfers Assistance Needed: Per pt he requires assist for transfers and ambulation at times.     Comments: Reports he is working with therapist at [facility]     Hand Dominance   Dominant Hand: Right    Extremity/Trunk Assessment   Upper Extremity Assessment: Generalized weakness           Lower Extremity Assessment: Generalized weakness         Communication   Communication: HOH  Cognition Arousal/Alertness: Awake/alert Behavior During Therapy: WFL for tasks assessed/performed Overall Cognitive Status: Difficult to assess                      General Comments      Exercises        Assessment/Plan    PT Assessment Patient needs continued PT services  PT Diagnosis Difficulty walking;Generalized weakness   PT Problem List Decreased strength;Decreased activity tolerance;Decreased balance;Decreased mobility;Obesity  PT Treatment Interventions DME instruction;Gait training;Functional mobility training;Therapeutic activities;Therapeutic exercise;Balance training;Patient/family education  PT Goals (Current goals can be found in the Care Plan section) Acute Rehab PT Goals Patient Stated Goal: Return to [facility] PT Goal Formulation: With patient Time For Goal Achievement: 02/02/16 Potential to Achieve Goals: Fair    Frequency Min 3X/week   Barriers to discharge        Co-evaluation               End of Session Equipment Utilized During Treatment: Gait belt Activity Tolerance: Patient limited by fatigue Patient left: in bed;with call bell/phone within reach;with bed alarm set Nurse Communication: Mobility status    Functional Assessment Tool Used: clinical observation Functional Limitation: Mobility: Walking and moving around Mobility:  Walking and Moving Around Current Status [phone](G8978): At least 80 percent but less than 100 percent impaired, limited or restricted Mobility: Walking and Moving Around Goal Status [phone](G8979): At least 40 percent but less than 60 percent impaired, limited or restricted    Time: [id number] PT Time Calculation (min) (ACUTE ONLY): 20 min   Charges:   PT Evaluation $PT Eval Moderate Complexity: 1 Procedure     PT G Codes:   PT G-Codes **NOT FOR INPATIENT CLASS** Functional Assessment Tool Used: clinical observation Functional Limitation: Mobility: Walking and moving around Mobility: Walking and Moving Around Current Status ([id number](G8978): At least 80 percent but less than 100 percent impaired, limited or restricted Mobility: Walking and Moving Around Goal Status [phone](G8979): At least 40 percent but less than 60 percent impaired, limited or restricted    [facility] Hospital[facility]YCOCK,Darl Kuss 01/19/2016, 4:19 PM [facility] Dim[facility] PT [phone]

## 2016-01-19 NOTE — Progress Notes (Signed)
[facility]                                                                              Patient Demographics  Eugene [name], is a 82 y.o. male, DOB - [birth year], ZOX:[id number]  Admit date - 01/17/2016   Admitting Physician Ozella Rocks, MD  Outpatient Primary MD for the patient is [facility]  LOS -   days    Chief Complaint  Patient presents with  . Shortness of Breath  . Chest Pain       Brief HPI  82 year old male patient resident of assisted living facility Who presented with exertional chest pain.  Patient has a history of CAD (severe RCA dz) and exertional chest discomfort that typically resolves with rest. He reports that his cardiologist Dr. Excell Seltzer told him anytime he was walking and developed chest pain to just sit down and rest. Patient also has a history of ischemic cardiomyopathy with combined systolic and diastolic heart failure last ejection fraction 35-40% on echocardiogram 2016, in addition he has chronic venous insufficiency, history of TAVR, Urinary retention, chronic atrial fibrillation on eliquis, dyslipidemia, sleep apnea, BPH, hypothyroidism and peripheral vascular disease. Patient was admitted for further workup.   Assessment & Plan   Atypical chest pain/CAD -resolved,troponin negative, EKG with inferior Q waves unchanged from prior -CAD with diffuse disease on last cath 10/15, medical management was recommended by Dr.Cooper -Increase Imdur to 30 mg daily, add hydralazine for BP control. Patient is not a candidate for beta blockers due to bradyarrhythmias in the past.  -not on ASA-since he takes eliquis, not on BB but HR in 50-60s range  Acute on chronic systolic and diastolic CHF -His ejection fraction is 35%, patient was volume overloaded with positive JVD and bilateral crackles -The patient was given 2 doses of IV Lasix, and restarted on oral Lasix 40 mg daily today  -2-D echo showed EF of 55-60% with grade 1 diastolic  dysfunction - Negative balance of 1.46 L   Severe aortic stenosis  -treated with TAVR in November 2015   Chronic venous insufficiency -Has chronic nonpitting lower extremity edema   Hypertension -Current blood pressure uncontrolled, 190-200 SBP overnight this am -On Hytrin and Flomax for BPH -Increase imdur, place on hydralazine should help with CAD/angina too   Chronic atrial fibrillation  -Continue eliquis and amiodarone - Not a candidate for beta blockers due to bradyarrhythmias - [place] VASC 4   Hyperlipidemia -Continue Pravachol   Obstructive sleep apnea    PVD (peripheral vascular disease)    Hypothyroidism -Continue Synthroid   BPH (benign prostatic hyperplasia) -Continue Ditropan, Hytrin and Flomax  Code Status: full code  Family Communication: Discussed in detail with the patient, all imaging results, lab results explained to the patient or   Disposition Pplan: hopefully tomorrow if BP is improving  Time Spent in minutes  25 minutes  Procedures  echo  Consults   None   DVT Prophylaxis  eliquis  Medications  Scheduled Meds: . amiodarone  200 mg Oral BID  . apixaban  2.5 mg Oral BID  . buPROPion  75 mg Oral BID  . furosemide  40 mg Oral Daily  . hydrALAZINE  25 mg Oral BID  . [START ON 01/20/2016] isosorbide mononitrate  30 mg Oral Daily  . latanoprost  1 drop Right Eye QHS  . levofloxacin  500 mg Oral Daily  . loratadine  10 mg Oral Daily  . multivitamin with minerals  1 tablet Oral QHS  . pantoprazole  40 mg Oral Daily  . polyvinyl alcohol  4 drop Both Eyes QID  . potassium chloride  20 mEq Oral Daily  . pravastatin  40 mg Oral Daily  . tamsulosin  0.4 mg Oral Daily  . terazosin  1 mg Oral Daily   Continuous Infusions:  PRN Meds:.acetaminophen, acetaminophen, docusate sodium, gi cocktail, hydrALAZINE, nitroGLYCERIN, polyethylene glycol   Antibiotics   Anti-infectives    Start     Dose/Rate Route Frequency Ordered Stop    01/18/16 1000  levofloxacin (LEVAQUIN) tablet 500 mg     500 mg Oral Daily 01/17/16 1810 01/23/16 0959        Subjective:   Dannette Barbarafird Vent was seen and examined today. Feels better today,  No chest pain. Patient denies dizziness, shortness of breath, abdominal pain, N/V/D/C, new weakness, numbess, tingling. No acute events overnight.    Objective:   Filed Vitals:   01/19/16 0428 01/19/16 0429 01/19/16 0545 01/19/16 1119  BP: 170/100 212/76 147/72 182/105  Pulse: 70     Temp: 98 F (36.7 C)     TempSrc: Oral     Resp: 18     SpO2: 95%       Intake/Output Summary (Last 24 hours) at 01/19/16 1207 Last data filed at 01/19/16 0600  Gross per 24 hour  Intake    480 ml  Output   1300 ml  Net   -820 ml     Wt Readings from Last 3 Encounters:  07/12/15 91.763 kg (202 lb 4.8 oz)  07/06/15 91.808 kg (202 lb 6.4 oz)  05/13/15 80.74 kg (178 lb)     Exam  General: Alert and oriented x 3, NAD  HEENT:  PERRLA, EOMI, Anicteric Sclera, mucous membranes moist.   Neck: Supple, no JVD, no masses  CVS: S1 S2 auscultated, no rubs, murmurs or gallops. Regular rate and rhythm.  Respiratory:decreased BS at bases   Abdomen: Soft, nontender, nondistended, + bowel sounds  Ext: no cyanosis clubbing, 1+ edema  Neuro: AAOx3, Cr N's II- XII. Strength 5/5 upper and lower extremities bilaterally  Skin: No rashes  Psych: Normal affect and demeanor, alert and oriented x3    Data Reviewed:  I have personally reviewed following labs and imaging studies  Micro Results No results found for this or any previous visit (from the past 240 hour(s)).  Radiology Reports Dg Chest 2 View  01/17/2016  CLINICAL DATA:  82 year old male with left side chest pain today while at rehab. Initial encounter. EXAM: CHEST  2 VIEW COMPARISON:  Report of 06/27/2015 chest radiographs (no images available). FINDINGS: Upright AP and lateral views of the chest. Moderate to severe cardiomegaly. Sequelae of cardiac  valve replacement. Tortuous thoracic aorta. Other mediastinal contours are within normal limits. Hypo ventilation at the lung bases with no pneumothorax, pulmonary edema, pleural effusion or confluent pulmonary opacity. No acute osseous abnormality identified. Calcified aortic atherosclerosis. IMPRESSION: Moderate to severe cardiomegaly. No acute cardiopulmonary abnormality. Electronically Signed   By: Odessa FlemingH  Hall M.D.   On: 01/17/2016 13:06    CBC  Recent Labs Lab 01/17/16 1151 01/19/16 0316  WBC 8.2 8.5  HGB 13.5 13.0  HCT 41.1 40.1  PLT 131* 142*  MCV 94.1 93.9  MCH 30.9 30.4  MCHC 32.8 32.4  RDW 13.6 14.1    Chemistries   Recent Labs Lab 01/17/16 1151 01/19/16 0316  NA 141 141  K 4.7 3.6  CL 106 106  CO2 26 25  GLUCOSE 112* 102*  BUN 24* 23*  CREATININE 1.32* 1.28*  CALCIUM 9.7 9.1  AST 23  --   ALT 15*  --   ALKPHOS 53  --   BILITOT 1.0  --    ------------------------------------------------------------------------------------------------------------------ CrCl cannot be calculated (Unknown ideal weight.). ------------------------------------------------------------------------------------------------------------------ No results for input(s): HGBA1C in the last 72 hours. ------------------------------------------------------------------------------------------------------------------ No results for input(s): CHOL, HDL, LDLCALC, TRIG, CHOLHDL, LDLDIRECT in the last 72 hours. ------------------------------------------------------------------------------------------------------------------ No results for input(s): TSH, T4TOTAL, T3FREE, THYROIDAB in the last 72 hours.  Invalid input(s): FREET3 ------------------------------------------------------------------------------------------------------------------ No results for input(s): VITAMINB12, FOLATE, FERRITIN, TIBC, IRON, RETICCTPCT in the last 72 hours.  Coagulation profile No results for input(s): INR, PROTIME in  the last 168 hours.  No results for input(s): DDIMER in the last 72 hours.  Cardiac Enzymes  Recent Labs Lab 01/17/16 2132 01/18/16 0413 01/18/16 1059  TROPONINI 0.03 0.03 0.03   ------------------------------------------------------------------------------------------------------------------ Invalid input(s): POCBNP  No results for input(s): GLUCAP in the last 72 hours.   Latoyia Tecson M.D. [facility] 01/19/2016, 12:07 PM  Pager: [phone] Between 7am to 7pm - call Pager - [phone]  After 7pm go to [email or web address] - password [email or web address]  Call night coverage person covering after 7pm

## 2016-01-19 NOTE — Care Management Note (Signed)
Case Management Note Donn PieriniKristi Phebe Dettmer RN, BSN Unit 2W-Case Manager [phone]  Patient Details  Name: Eugene [name] MRN: [id number] Date of Birth: [birth year]  Subjective/Objective:     Pt admitted with chest pain               Action/Plan: PTA pt at at ALF- [facility]- plans to return there at discharge- [facility]- will resume HH on return- CSW following for d/c needs  Expected Discharge Date:   01/19/16               Expected Discharge Plan:  Assisted Living / Rest Home  In-House Referral:  Clinical Social Work  Discharge planning Services  CM Consult  Post Acute Care Choice:  Home Health, Resumption of Svcs/PTA Provider Choice offered to:     DME Arranged:    DME Agency:     HH Arranged:    HH [facility]  Status of Service:  Completed, signed off  Medicare Important Message Given:    Date Medicare IM Given:    Medicare IM give by:    Date Additional Medicare IM Given:    Additional Medicare Important Message give by:     If discussed at Long Length of Stay Meetings, dates discussed:    Discharge Disposition: Assisted Living   Additional Comments:  Darrold SpanWebster, Kyli Sorter Hall, RN 01/19/2016, 2:36 PM

## 2016-01-19 NOTE — Discharge Instructions (Signed)

## 2016-01-20 DIAGNOSIS — R0789 Other chest pain: Secondary | ICD-10-CM | POA: Diagnosis not present

## 2016-01-20 DIAGNOSIS — N400 Enlarged prostate without lower urinary tract symptoms: Secondary | ICD-10-CM | POA: Diagnosis not present

## 2016-01-20 DIAGNOSIS — I2510 Atherosclerotic heart disease of native coronary artery without angina pectoris: Secondary | ICD-10-CM | POA: Diagnosis not present

## 2016-01-20 DIAGNOSIS — R079 Chest pain, unspecified: Secondary | ICD-10-CM | POA: Diagnosis not present

## 2016-01-20 DIAGNOSIS — I130 Hypertensive heart and chronic kidney disease with heart failure and stage 1 through stage 4 chronic kidney disease, or unspecified chronic kidney disease: Secondary | ICD-10-CM | POA: Diagnosis not present

## 2016-01-20 MED ORDER — TRAMADOL HCL 50 MG PO TABS: 50.0000 mg | ORAL_TABLET | Freq: Four times a day (QID) | ORAL | Status: DC | PRN

## 2016-01-20 MED ORDER — ISOSORBIDE MONONITRATE ER 30 MG PO TB24: 30.0000 mg | ORAL_TABLET | Freq: Every day | ORAL | Status: AC

## 2016-01-20 MED ORDER — ADULT MULTIVITAMIN W/MINERALS CH: 1.0000 | ORAL_TABLET | Freq: Every day | ORAL | Status: DC

## 2016-01-20 MED ORDER — HYDRALAZINE HCL 25 MG PO TABS: 25.0000 mg | ORAL_TABLET | Freq: Two times a day (BID) | ORAL | Status: AC

## 2016-01-20 NOTE — Discharge Summary (Signed)
Physician Discharge Summary   Patient ID: Eugene [name] MRN: [id number] DOB/AGE: Sep 27, 1924 82 y.o.  Admit date: 01/17/2016 Discharge date: 01/20/2016  Primary Care Physician:  [facility]  Discharge Diagnoses:   . Acute on Chronic combined systolic and diastolic CHF, NYHA class 1 (HCC) . Chest pain . Chronic venous insufficiency . Hyperlipidemia . CAD (coronary artery disease) . Obstructive sleep apnea . PVD (peripheral vascular disease) (HCC) . Hypertension . BPH (benign prostatic hyperplasia) . Ischemic cardiomyopathy . Hypothyroidism . Chronic atrial fibrillation (HCC)   Consults:  None  Recommendations for Outpatient Follow-up:  1. Please continue PT, OT, RN follow-up 2. Please repeat CBC/BMET at next visit   DIET heart healthy diet    Allergies:   Allergies  Allergen Reactions  . Corn-Containing Products Other (See Comments)    Trouble digesting  . Penicillins Swelling and Other (See Comments)    REACTION: "turns red"     DISCHARGE MEDICATIONS: Current Discharge Medication List    START taking these medications   Details  hydrALAZINE (APRESOLINE) 25 MG tablet Take 1 tablet (25 mg total) by mouth 2 (two) times daily. Qty: 60 tablet, Refills: 3    isosorbide mononitrate (IMDUR) 30 MG 24 hr tablet Take 1 tablet (30 mg total) by mouth daily. Qty: 30 tablet, Refills: 3      CONTINUE these medications which have CHANGED   Details  traMADol (ULTRAM) 50 MG tablet Take 1 tablet (50 mg total) by mouth every 6 (six) hours as needed for moderate pain or severe pain (pain). Qty: 30 tablet, Refills: 0      CONTINUE these medications which have NOT CHANGED   Details  acetaminophen (TYLENOL) 500 MG tablet Take 1,000 mg by mouth every 6 (six) hours as needed (pain).    amiodarone (PACERONE) 200 MG tablet Take 1 tablet (200 mg total) by mouth 2 (two) times daily. Qty: 60 tablet, Refills: 11    apixaban (ELIQUIS) 2.5 MG TABS tablet Take 1 tablet  (2.5 mg total) by mouth 2 (two) times daily. Qty: 60 tablet, Refills: 11    buPROPion (WELLBUTRIN) 75 MG tablet Take 75 mg by mouth 2 (two) times daily.    Calcium Carb-Cholecalciferol (CALCIUM 600+D) 600-800 MG-UNIT TABS Take 1 tablet by mouth daily.    carboxymethylcellulose (REFRESH) 1 % ophthalmic solution Place 4 drops into both eyes 4 (four) times daily.    cetirizine (ZYRTEC) 10 MG tablet Take 10 mg by mouth daily.    docusate sodium (COLACE) 100 MG capsule Take 100 mg by mouth 2 (two) times daily as needed for mild constipation (constipation).    furosemide (LASIX) 40 MG tablet Take 1 tablet (40 mg total) by mouth daily. Qty: 30 tablet, Refills: 12    levofloxacin (LEVAQUIN) 500 MG tablet Take 500 mg by mouth daily. For 10 days. Started on 4-7    Multiple Vitamin (MULTIVITAMIN) tablet Take 1 tablet by mouth at bedtime.    nitroGLYCERIN (NITROSTAT) 0.4 MG SL tablet Place 1 tablet (0.4 mg total) under the tongue every 5 (five) minutes x 3 doses as needed for chest pain. Qty: 25 tablet, Refills: 12    omeprazole (PRILOSEC) 20 MG capsule Take 20 mg by mouth daily.    polyethylene glycol (MIRALAX / GLYCOLAX) packet Take 17 g by mouth as needed. Take one packet mixed with liquid by mouth on Monday, Wednesday, and Friday    potassium chloride (K-DUR) 10 MEQ tablet Take 2 tablets (20 mEq total) by mouth daily. Qty: 60  tablet, Refills: 11    pravastatin (PRAVACHOL) 40 MG tablet Take 1 tablet (40 mg total) by mouth daily. Qty: 90 tablet, Refills: 4    Skin Protectants, Misc. (BAZA PROTECT EX) Apply 1 application topically 3 (three) times daily.    tamsulosin (FLOMAX) 0.4 MG CAPS capsule Take 1 capsule (0.4 mg total) by mouth daily. Qty: 30 capsule, Refills: 11    terazosin (HYTRIN) 1 MG capsule Take 1 mg by mouth daily.    Travoprost, BAK Free, (TRAVATAN) 0.004 % SOLN ophthalmic solution Place 1 drop into the right eye at bedtime.      STOP taking these medications      Multiple Vitamins-Minerals (PRESERVISION AREDS PO)      rifampin (RIFADIN) 150 MG capsule      levothyroxine (SYNTHROID, LEVOTHROID) 25 MCG tablet          Brief H and P: For complete details please refer to admission H and P, but in brief 82 year old male patient resident of assisted living facility Who presented with exertional chest pain. Patient has a history of CAD (severe RCA dz) and exertional chest discomfort that typically resolves with rest. He reports that his cardiologist Dr. Excell Seltzer told him anytime he was walking and developed chest pain to just sit down and rest. Patient also has a history of ischemic cardiomyopathy with combined systolic and diastolic heart failure last ejection fraction 35-40% on echocardiogram 2016, in addition he has chronic venous insufficiency, history of TAVR, Urinary retention, chronic atrial fibrillation on eliquis, dyslipidemia, sleep apnea, BPH, hypothyroidism and peripheral vascular disease. Patient was admitted for further workup.  Hospital Course:   Atypical chest pain/CAD -resolved,troponin negative, EKG with inferior Q waves unchanged from prior -CAD with diffuse disease on last cath 10/15, medical management was recommended by Dr.Cooper -Increase Imdur to 30 mg daily, add hydralazine for BP control. Patient is not a candidate for beta blockers due to bradyarrhythmias in the past.  -not on ASA-since he takes eliquis, not on BB but HR in 50-60s range  Acute on chronic systolic and diastolic CHF -His ejection fraction is 35%, patient was volume overloaded with positive JVD and bilateral crackles -The patient was given 2 doses of IV Lasix, and restarted on oral Lasix 40 mg daily , check BMET at the next visit -2-D echo showed EF of 55-60% with grade 1 diastolic dysfunction - Negative balance of 1.7 L   Severe aortic stenosis  -treated with TAVR in November 2015   Chronic venous insufficiency -Has chronic nonpitting lower extremity  edema   Hypertension -Current blood pressure uncontrolled, 190-200 SBP overnight this am -On Hytrin and Flomax for BPH -Imdur was increased to 30 mg daily, hydralazine added 25 mg twice a day,  should help with CAD/angina as well   Chronic atrial fibrillation  -Continue eliquis and amiodarone - Not a candidate for beta blockers due to bradyarrhythmias - [place] VASC 4   Hyperlipidemia -Continue Pravachol   Obstructive sleep apnea    PVD (peripheral vascular disease)    Hypothyroidism -Continue Synthroid   BPH (benign prostatic hyperplasia) -Continue Ditropan, Hytrin and Flomax  Day of Discharge BP 159/86 mmHg  Pulse 74  Temp(Src) 98 F (36.7 C) (Oral)  Resp 20  Wt 91.491 kg (201 lb 11.2 oz)  SpO2 94%  Physical Exam: General: Alert and awake oriented x3 not in any acute distress. HEENT: anicteric sclera, pupils reactive to light and accommodation CVS: S1-S2 clear no murmur rubs or gallops Chest: clear to auscultation bilaterally, no  wheezing rales or rhonchi Abdomen: soft nontender, nondistended, normal bowel sounds Extremities: no cyanosis, clubbing or edema noted bilaterally Neuro: Cranial nerves II-XII intact, no focal neurological deficits   The results of significant diagnostics from this hospitalization (including imaging, microbiology, ancillary and laboratory) are listed below for reference.    LAB RESULTS: Basic Metabolic Panel:  Recent Labs Lab 01/17/16 1151 01/19/16 0316  NA 141 141  K 4.7 3.6  CL 106 106  CO2 26 25  GLUCOSE 112* 102*  BUN 24* 23*  CREATININE 1.32* 1.28*  CALCIUM 9.7 9.1   Liver Function Tests:  Recent Labs Lab 01/17/16 1151  AST 23  ALT 15*  ALKPHOS 53  BILITOT 1.0  PROT 5.5*  ALBUMIN 3.2*   No results for input(s): LIPASE, AMYLASE in the last 168 hours. No results for input(s): AMMONIA in the last 168 hours. CBC:  Recent Labs Lab 01/17/16 1151 01/19/16 0316  WBC 8.2 8.5  HGB 13.5 13.0  HCT 41.1 40.1   MCV 94.1 93.9  PLT 131* 142*   Cardiac Enzymes:  Recent Labs Lab 01/18/16 0413 01/18/16 1059  TROPONINI 0.03 0.03   BNP: Invalid input(s): POCBNP CBG: No results for input(s): GLUCAP in the last 168 hours.  Significant Diagnostic Studies:  Dg Chest 2 View  01/17/2016  CLINICAL DATA:  82 year old male with left side chest pain today while at rehab. Initial encounter. EXAM: CHEST  2 VIEW COMPARISON:  Report of 06/27/2015 chest radiographs (no images available). FINDINGS: Upright AP and lateral views of the chest. Moderate to severe cardiomegaly. Sequelae of cardiac valve replacement. Tortuous thoracic aorta. Other mediastinal contours are within normal limits. Hypo ventilation at the lung bases with no pneumothorax, pulmonary edema, pleural effusion or confluent pulmonary opacity. No acute osseous abnormality identified. Calcified aortic atherosclerosis. IMPRESSION: Moderate to severe cardiomegaly. No acute cardiopulmonary abnormality. Electronically Signed   By: Odessa Fleming M.D.   On: 01/17/2016 13:06    2D ECHO: Study Conclusions  - Left ventricle: The cavity size was normal. There was moderate  concentric hypertrophy. Systolic function was normal. The  estimated ejection fraction was in the range of 55% to 60%. There  was an increased relative contribution of atrial contraction to  ventricular filling. Doppler parameters are consistent with  abnormal left ventricular relaxation (grade 1 diastolic  dysfunction). - Ventricular septum: Septal motion showed moderate paradox. These  changes are consistent with intraventricular conduction delay. - Aortic valve: S/P TAVR which appears well seated with normal  function. Peak velocity (S): 184 cm/s. Mean gradient (S): 6 mm  Hg. Valve area (VTI): 2.61 cm^2. Valve area (Vmax): 2.68 cm^2.  Valve area (Vmean): 2.45 cm^2. - Mitral valve: Calcified annulus. - Left atrium: The atrium was mildly dilated.  Disposition and  Follow-up: Discharge Instructions    (HEART FAILURE PATIENTS) Call MD:  Anytime you have any of the following symptoms: 1) 3 pound weight gain in 24 hours or 5 pounds in 1 week 2) shortness of breath, with or without a dry hacking cough 3) swelling in the hands, feet or stomach 4) if you have to sleep on extra pillows at night in order to breathe.    Complete by:  As directed      Diet - low sodium heart healthy    Complete by:  As directed      Increase activity slowly    Complete by:  As directed             DISPOSITION: ALF  DISCHARGE FOLLOW-UP Follow-up Information    [facility]. Schedule an appointment as soon as possible for a visit in 10 days.   Why:  for hospital follow-up, obtain labs for renal function/ they will give pt a call for an appointment    Contact information:   [address] [facility] [address] [place] [address] [phone]       Follow up with Jacolyn ReedyMichele Lenze, PA-C On 01/30/2016.   Specialty:  Cardiology   Why:  for hospital follow-up with Cardiology. Please arrive at 12:30 pm, for an appointment @12 :45   Contact information:   [address] [place] [address] [phone]        Time spent on Discharge: 35 minutes  Signed:   RAI,RIPUDEEP M.D. [facility] 01/20/2016, 11:12 AM Pager: [phone]

## 2016-01-20 NOTE — Progress Notes (Signed)
Report called to [facility]. Nurse has no further questions. Paperwork ready for PTAR. Will continue to monitor.

## 2016-01-20 NOTE — Clinical Social Work Note (Signed)
CSW called patient's HPOA. CSW left message with patient's HPOA that patient will be discharged and [facility]. CSW called the patient's niece Lola. CSW informed the patient's niece that the patient is [facility]. CSW explained to patient's niece that [facility] will not be able to provide transport to patient. The patient's niece agreed that patient would [facility]. Per MD patient is ready for return to ALF. CSW has made RN, patient, patient's family, and facility aware of discharge. Facility rep Efraim KaufmannMelissa confirms that the patient can return. Transport packet given to RN along with number for report. Ambulance transport has been requested for next available pickup. CSW signing off.  Shonny Gena Laski, LCSW [phone]

## 2016-01-26 ENCOUNTER — Telehealth: Payer: Self-pay

## 2016-01-26 NOTE — Telephone Encounter (Signed)
While calling patient to confirm appt called Jeanella AntonMichael Kenney to try and confirm the apt. Casimiro NeedleMichael stated he hasn't been involved with Mr.Severe's care since last June and to call Fayrene FearingJames [name] to confirm pt's apt. He also stated he wanted his name and phone number taken out of the patients chart as a contact. As per the request, Mr.Kenney's name and number were removed from patients chart. I was able to confirm patients apt w/ Jacolyn ReedyMichele Lenze PA-C on 01/30/16 at 12:45 with Orland PenmanJames [name].

## 2016-01-30 ENCOUNTER — Ambulatory Visit (INDEPENDENT_AMBULATORY_CARE_PROVIDER_SITE_OTHER): Payer: Medicare Other | Admitting: Physician Assistant

## 2016-01-30 ENCOUNTER — Encounter: Payer: Self-pay | Admitting: Physician Assistant

## 2016-01-30 VITALS — BP 140/64 | HR 79 | Resp 18

## 2016-01-30 DIAGNOSIS — I10 Essential (primary) hypertension: Secondary | ICD-10-CM | POA: Diagnosis not present

## 2016-01-30 DIAGNOSIS — I5042 Chronic combined systolic (congestive) and diastolic (congestive) heart failure: Secondary | ICD-10-CM | POA: Diagnosis not present

## 2016-01-30 DIAGNOSIS — R001 Bradycardia, unspecified: Secondary | ICD-10-CM

## 2016-01-30 DIAGNOSIS — I482 Chronic atrial fibrillation, unspecified: Secondary | ICD-10-CM

## 2016-01-30 DIAGNOSIS — I209 Angina pectoris, unspecified: Secondary | ICD-10-CM

## 2016-01-30 DIAGNOSIS — I2510 Atherosclerotic heart disease of native coronary artery without angina pectoris: Secondary | ICD-10-CM

## 2016-01-30 DIAGNOSIS — T50905A Adverse effect of unspecified drugs, medicaments and biological substances, initial encounter: Secondary | ICD-10-CM

## 2016-01-30 DIAGNOSIS — I5033 Acute on chronic diastolic (congestive) heart failure: Secondary | ICD-10-CM

## 2016-01-30 DIAGNOSIS — Z79899 Other long term (current) drug therapy: Secondary | ICD-10-CM

## 2016-01-30 LAB — CBC WITH DIFFERENTIAL/PLATELET
BASOS ABS: 0 {cells}/uL (ref 0–200)
Basophils Relative: 0 %
EOS ABS: 207 {cells}/uL (ref 15–500)
EOS PCT: 3 %
HEMATOCRIT: 38.1 % — AB (ref 38.5–50.0)
HEMOGLOBIN: 12.7 g/dL — AB (ref 13.2–17.1)
Lymphocytes Relative: 20 %
Lymphs Abs: 1380 cells/uL (ref 850–3900)
MCH: 30.9 pg (ref 27.0–33.0)
MCHC: 33.3 g/dL (ref 32.0–36.0)
MCV: 92.7 fL (ref 80.0–100.0)
MPV: 8.6 fL (ref 7.5–12.5)
Monocytes Absolute: 690 cells/uL (ref 200–950)
Monocytes Relative: 10 %
Neutro Abs: 4623 cells/uL (ref 1500–7800)
Neutrophils Relative %: 67 %
Platelets: 171 10*3/uL (ref 140–400)
RBC: 4.11 MIL/uL — AB (ref 4.20–5.80)
RDW: 14.3 % (ref 11.0–15.0)
WBC: 6.9 10*3/uL (ref 3.8–10.8)

## 2016-01-30 LAB — BASIC METABOLIC PANEL
BUN: 23 mg/dL (ref 7–25)
CO2: 27 mmol/L (ref 20–31)
Calcium: 8.7 mg/dL (ref 8.6–10.3)
Chloride: 103 mmol/L (ref 98–110)
Creat: 1.21 mg/dL — ABNORMAL HIGH (ref 0.70–1.11)
GLUCOSE: 103 mg/dL — AB (ref 65–99)
POTASSIUM: 4.1 mmol/L (ref 3.5–5.3)
SODIUM: 141 mmol/L (ref 135–146)

## 2016-01-30 NOTE — Patient Instructions (Signed)
Medication Instructions:  Your physician recommends that you continue on your current medications as directed. Please refer to the Current Medication list given to you today.  Labwork: Your physician recommends that you have lab work today BMET and CBC   Testing/Procedures: NONE  Follow-Up: Your physician recommends that you schedule a follow-up appointment in: 2 months with Dr. [name]   If you need a refill on your cardiac medications before your next appointment, please call your pharmacy.

## 2016-01-30 NOTE — Progress Notes (Signed)
Cardiology Office Note    Date:  01/30/2016   ID:  Eugene [name], DOB 1924/01/24, MRN [id number]  PCP:  [facility]  Cardiologist:  Dr. Excell Seltzer   Chief complaint weakness  History of Present Illness:  Eugene [name] is a 82 y.o. male with a history of aortic valve disease, chronic diastolic heart failure, and CAD. His coronary artery disease dates back to the early 1990s when he presented with an inferior wall MI treated with balloon angioplasty of the right coronary artery. He underwent PCI of severe stenosis in the proximal LAD in 2013 after presenting with syncope and elevated cardiac enzymes. He was noted to have severe aortic stenosis at that time and underwent balloon aortic valvuloplasty. He also has peripheral arterial disease and has had nonhealing ulcerations. He underwent angioplasty of the left tibial artery in 2014. His severe aortic stenosis was ultimately treated with TAVR in November 2015 via a transfemoral approach. He's been treated with aspirin alone because of gastrointestinal bleeding on Plavix. However, he was hospitalized again with atrial fibrillation/flutter in February. He was started on Eliquis during that hospitalization.  Patient was just discharged from the hospital on 01/20/16 after admission with acute on chronic systolic and diastolic heart failure. EF was 55% on 2-D echo. He also had chest pain which resolved and troponins were negative EKG without acute change. Imdur was increased and hydralazine was added for better blood pressure control. Beta blockers were not used because of bradycardia arrhythmias in the past.  Patient comes in today accompanied by his personal assistant who transported [facility] from [facility]. Overall he is feeling better since hospitalization. He has had no further chest pain or shortness of breath. He is trying to do physical therapy but is very weak and isn't doing a whole lot of anything. His weight yesterday at  the nursing facility was 198 pounds and discharge weight was 201.     Past Medical History  Diagnosis Date  . Glaucoma     Right eye  . Diverticulosis     multiple hospital admissions for GI bleed, has an episode roughly every 6 months,, last  colonscopy in 2006 showed diverticulosis, was evaluated by Dr. Madilyn Fireman in the hospital in 2011 for lower GI bleed andd it  was suggested that the patient was not actively bleeding at that time and given his comorbid conditions colonoscopy was deferred. He is also chronically constipated  . Macular degeneration of right eye   . Allergic rhinitis   . CAD (coronary artery disease)     a. h/o remote inferior MI, h/o PCI to RCA in 1991. b. syncope 2013: severe AS and high grade prox LAD and other diffuse disease. High risk for CABG - s/p BMS to prox LAD and balloon aortic valvuloplasty. c. cath 07/16/14 - diffuse CAD, managed medically.  . Hypertension   . Hyperlipidemia   . Chronic combined systolic and diastolic CHF (congestive heart failure) (HCC)     a. Most recent echo 08/2014: EF 30-35%.  . Supraventricular tachycardia (HCC)     2 syndromes-nonsustained atrial tachycardia//adenosine responsive diuretic positive reentry probably AV node reentry  . Aortic stenosis     a. s/p AV balloon valvuloplasty by Dr. Excell Seltzer 11/2011. b. s/p TAVR 08/2014.  . Ischemic cardiomyopathy     a. 2D ECHO 11/2014: EF of 20-25%  . H/O: GI bleed   . Hypothyroidism   . GERD (gastroesophageal reflux disease)   . Falls frequently  3 times in the past week/notes 09/11/2013  . PAF (paroxysmal atrial fibrillation) (HCC)     a. on amiodarone and eliquis   . Peripheral vascular disease (HCC)     a. h/o ulcer on the left great toe s/p angioplasty of the left anterior tibial artery.  . OSA on CPAP   . Limited mobility   . S/P TAVR (transcatheter aortic valve replacement)     a. 08/2014: 29 mm Edwards Sapien XT transcatheter heart valve placed via open left transfemoral approach   . PAT (paroxysmal atrial tachycardia) (HCC)   . Atrial flutter [facility](HCC)     Past Surgical History  Procedure Laterality Date  . Glaucoma surgery      Implantation of Baerveldt glaucoma device lant with scleral reinforcement using tutoplast tissue graft right eye. [Other]  . Coronary angioplasty with stent placement  Jan. 23, 2013  . Corneal transplant Right   . Angioplasty Left 03/30/2013    tibial  . Tonsillectomy  1946  . Cataract extraction w/ intraocular lens  implant, bilateral Bilateral   . Cardiac valve replacement  11/30/2011    aortic valve.?  . Transcatheter aortic valve replacement, transfemoral N/A 08/10/2014    Procedure: TRANSCATHETER AORTIC VALVE REPLACEMENT, TRANSFEMORAL;  Surgeon: Tonny BollmanMichael Cooper, MD;  [facility];  Service: Open Heart Surgery;  Laterality: N/A;  . Tee without cardioversion N/A 08/10/2014    Procedure: TRANSESOPHAGEAL ECHOCARDIOGRAM (TEE);  Surgeon: Tonny BollmanMichael Cooper, MD;  [facility];  Service: Open Heart Surgery;  Laterality: N/A;  . Coronary angiogram  11/02/2011    Procedure: CORONARY ANGIOGRAM;  Surgeon: Rollene RotundaJames Hochrein, MD;  [facility];  Service: Cardiovascular;;  . Left heart catheterization with coronary angiogram N/A 11/12/2011    Procedure: LEFT HEART CATHETERIZATION WITH CORONARY ANGIOGRAM;  Surgeon: Tonny BollmanMichael Cooper, MD;  [facility];  Service: Cardiovascular;  Laterality: N/A;  . Percutaneous coronary stent intervention (pci-s) N/A 11/14/2011    Procedure: PERCUTANEOUS CORONARY STENT INTERVENTION (PCI-S);  Surgeon: Tonny BollmanMichael Cooper, MD;  [facility];  Service: Cardiovascular;  Laterality: N/A;  . Abdominal aortagram N/A 03/30/2013    Procedure: ABDOMINAL Ronny FlurryAORTAGRAM;  Surgeon: Chuck Hinthristopher S Dickson, MD;  [facility];  Service: Cardiovascular;  Laterality: N/A;  . Left and right heart catheterization with coronary angiogram N/A 07/16/2014    Procedure: LEFT AND RIGHT HEART CATHETERIZATION WITH CORONARY ANGIOGRAM;   Surgeon: Micheline ChapmanMichael D Cooper, MD;  [facility];  Service: Cardiovascular;  Laterality: N/A;    Current Medications: Outpatient Prescriptions Prior to Visit  Medication Sig Dispense Refill  . acetaminophen (TYLENOL) 500 MG tablet Take 1,000 mg by mouth every 6 (six) hours as needed (pain).    Marland Kitchen. amiodarone (PACERONE) 200 MG tablet Take 1 tablet (200 mg total) by mouth 2 (two) times daily. 60 tablet 11  . apixaban (ELIQUIS) 2.5 MG TABS tablet Take 1 tablet (2.5 mg total) by mouth 2 (two) times daily. 60 tablet 11  . buPROPion (WELLBUTRIN) 75 MG tablet Take 75 mg by mouth 2 (two) times daily.    . Calcium Carb-Cholecalciferol (CALCIUM 600+D) 600-800 MG-UNIT TABS Take 1 tablet by mouth daily.    . carboxymethylcellulose (REFRESH) 1 % ophthalmic solution Place 4 drops into both eyes 4 (four) times daily.    . cetirizine (ZYRTEC) 10 MG tablet Take 10 mg by mouth daily.    . hydrALAZINE (APRESOLINE) 25 MG tablet Take 1 tablet (25 mg total) by mouth 2 (two) times daily. 60 tablet 3  .  isosorbide mononitrate (IMDUR) 30 MG 24 hr tablet Take 1 tablet (30 mg total) by mouth daily. 30 tablet 3  . Multiple Vitamin (MULTIVITAMIN) tablet Take 1 tablet by mouth at bedtime.    Marland Kitchen omeprazole (PRILOSEC) 20 MG capsule Take 20 mg by mouth daily.    . polyethylene glycol (MIRALAX / GLYCOLAX) packet Take 17 g by mouth as needed. Take one packet mixed with liquid by mouth on Monday, Wednesday, and Friday    . pravastatin (PRAVACHOL) 40 MG tablet Take 1 tablet (40 mg total) by mouth daily. 90 tablet 4  . Skin Protectants, Misc. (BAZA PROTECT EX) Apply 1 application topically 3 (three) times daily.    . tamsulosin (FLOMAX) 0.4 MG CAPS capsule Take 1 capsule (0.4 mg total) by mouth daily. 30 capsule 11  . terazosin (HYTRIN) 1 MG capsule Take 1 mg by mouth daily.    . Travoprost, BAK Free, (TRAVATAN) 0.004 % SOLN ophthalmic solution Place 1 drop into the right eye at bedtime.    . docusate sodium (COLACE) 100 MG  capsule Take 100 mg by mouth 2 (two) times daily as needed for mild constipation (constipation). Reported on 01/30/2016    . nitroGLYCERIN (NITROSTAT) 0.4 MG SL tablet Place 1 tablet (0.4 mg total) under the tongue every 5 (five) minutes x 3 doses as needed for chest pain. (Patient not taking: Reported on 01/30/2016) 25 tablet 12  . traMADol (ULTRAM) 50 MG tablet Take 1 tablet (50 mg total) by mouth every 6 (six) hours as needed for moderate pain or severe pain (pain). (Patient not taking: Reported on 01/30/2016) 30 tablet 0  . furosemide (LASIX) 40 MG tablet Take 1 tablet (40 mg total) by mouth daily. (Patient not taking: Reported on 01/30/2016) 30 tablet 12  . levofloxacin (LEVAQUIN) 500 MG tablet Take 500 mg by mouth daily. Reported on 01/30/2016    . potassium chloride (K-DUR) 10 MEQ tablet Take 2 tablets (20 mEq total) by mouth daily. (Patient not taking: Reported on 01/30/2016) 60 tablet 11   No facility-administered medications prior to visit.     Allergies:   Corn-containing products and Penicillins   Social History   Social History  . Marital Status: Widowed    Spouse Name: N/A  . Number of Children: N/A  . Years of Education: N/A   Social History Main Topics  . Smoking status: Former Smoker -- 3.00 packs/day for 21 years    Types: Cigarettes    Quit date: 10/08/1958  . Smokeless tobacco: Never Used  . Alcohol Use: No  . Drug Use: No  . Sexual Activity: No   Other Topics Concern  . None   Social History Narrative   Lives at the [facility] nursing [facility]. His pastor is his HCPOA  [facility]) [phone]). His daugther has been removed because she use $500k from his estate without his approval.  He also has a close friend who takes him for his doctor's appointments and is with him during the hospital admission. 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 Never smoked or drank alcohol in past 40 years. No illicit drug use. Has  Medicare.     Family History:  The patient's    family history includes Cancer in his father and mother; Heart disease in his sister; Stroke in his sister. There is no history of Heart attack.   ROS:   Please see the history of present illness.  Review of Systems  Constitution: Positive for weakness.  HENT: Positive for hearing loss.   Cardiovascular: Negative.   Respiratory: Negative.   Endocrine: Negative.   Hematologic/Lymphatic: Negative.   Skin: Positive for rash.  Musculoskeletal: Positive for muscle weakness and myalgias.  Gastrointestinal: Negative.   Genitourinary: Negative.    All other systems reviewed and are negative.   PHYSICAL EXAM:   VS:  BP 140/64 mmHg  Pulse 79  Resp 18  SpO2 95%   GEN: Elderly, in no acute distress Neck: no JVD, carotid bruits, or masses Cardiac:  RRR; no murmurs, rubs, or gallops,no edema  Respiratory:  clear to auscultation bilaterally, normal work of breathing GI: soft, nontender, nondistended, + BS MS: no deformity or atrophy Skin: warm and dry, no rash Neuro:  Alert and Oriented x 3,    Wt Readings from Last 3 Encounters:  01/20/16 201 lb 11.2 oz (91.491 kg)  07/12/15 202 lb 4.8 oz (91.763 kg)  07/06/15 202 lb 6.4 oz (91.808 kg)      Studies/Labs Reviewed:   EKG:  EKG is not ordered today.   Recent Labs: 03/01/2015: TSH 2.39 01/17/2016: ALT 15*; B Natriuretic Peptide 218.3* 01/19/2016: BUN 23*; Creatinine, Ser 1.28*; Hemoglobin 13.0; Platelets 142*; Potassium 3.6; Sodium 141   Lipid Panel    Component Value Date/Time   CHOL 154 02/03/2015 0943   TRIG 267* 02/03/2015 0943   HDL 39* 02/03/2015 0943   CHOLHDL 3.9 02/03/2015 0943   VLDL 53* 02/03/2015 0943   LDLCALC 62 02/03/2015 0943    Additional studies/ records that were reviewed today include:  2-D echo 01/18/16 2D ECHO: Study Conclusions   - Left ventricle: The cavity size was normal. There was moderate   concentric hypertrophy. Systolic function was normal.  The   estimated ejection fraction was in the range of 55% to 60%. There   was an increased relative contribution of atrial contraction to   ventricular filling. Doppler parameters are consistent with   abnormal left ventricular relaxation (grade 1 diastolic   dysfunction). - Ventricular septum: Septal motion showed moderate paradox. These   changes are consistent with intraventricular conduction delay. - Aortic valve: S/P TAVR which appears well seated with normal   function. Peak velocity (S): 184 cm/s. Mean gradient (S): 6 mm   Hg. Valve area (VTI): 2.61 cm^2. Valve area (Vmax): 2.68 cm^2.   Valve area (Vmean): 2.45 cm^2. - Mitral valve: Calcified annulus. - Left atrium: The atrium was mildly dilated.      ASSESSMENT:    1. Medication management   2. Chronic combined systolic and diastolic CHF, NYHA class 1 (HCC)   3. Essential hypertension   4. Coronary artery disease involving native coronary artery of native heart without angina pectoris   5. Chronic atrial fibrillation (HCC)   6. Bradycardia       PLAN:  In order of problems listed above: Patient had recent hospitalization with acute on chronic diastolic heart failure. 2-D echo showed normal systolic function, grade 1 diastolic dysfunction. He diuresed well and his Lasix was increased to 60 mg daily. His weight is stable and edema stable. Continue current medications. Follow-up with Dr. Excell Seltzer in 2 months check labs today.  Hypertension better controlled on hydralazine  CAD with chest pain in the hospital negative troponins. No further chest pain since Imdur increased.  Atrial fib on Eliquis and amiodarone  Bradycardia therefore no beta blocker    Medication Adjustments/Labs and Tests Ordered: Current medicines are reviewed at length  with the patient today.  Concerns regarding medicines are outlined above.  Medication changes, Labs and Tests ordered today are listed in the Patient Instructions below. Patient  Instructions  Medication Instructions:  Your physician recommends that you continue on your current medications as directed. Please refer to the Current Medication list given to you today.  Labwork: Your physician recommends that you have lab work today BMET and CBC   Testing/Procedures: NONE  Follow-Up: Your physician recommends that you schedule a follow-up appointment in: 2 months with Dr. Excell Seltzer   If you need a refill on your cardiac medications before your next appointment, please call your pharmacy.       Signed, Jacolyn Reedy, PA-C  01/30/2016 1:16 PM    [facility] [address] [place], [place], [place]  [address] Phone: [phone]; Fax: [phone]

## 2016-02-01 ENCOUNTER — Telehealth: Payer: Self-pay | Admitting: Physician Assistant

## 2016-02-01 NOTE — Telephone Encounter (Signed)
Returned Melissa's call from [facility] and reported pts labs. She verbalized understanding.

## 2016-02-01 NOTE — Telephone Encounter (Signed)
New message ° ° ° ° °Returning a call to the nurse °

## 2016-03-13 ENCOUNTER — Encounter: Payer: Self-pay | Admitting: Cardiovascular Disease

## 2016-03-13 ENCOUNTER — Encounter (HOSPITAL_COMMUNITY): Payer: Self-pay

## 2016-03-13 ENCOUNTER — Emergency Department (HOSPITAL_COMMUNITY)
Admission: EM | Admit: 2016-03-13 | Discharge: 2016-03-14 | Disposition: A | Discharge status: Home / Self Care | Payer: Medicare Other | Attending: Emergency Medicine | Admitting: Emergency Medicine

## 2016-03-13 ENCOUNTER — Ambulatory Visit (INDEPENDENT_AMBULATORY_CARE_PROVIDER_SITE_OTHER): Payer: Medicare Other | Admitting: Cardiovascular Disease

## 2016-03-13 VITALS — BP 130/70 | HR 80 | Ht 67.0 in | Wt 194.0 lb

## 2016-03-13 DIAGNOSIS — Z7901 Long term (current) use of anticoagulants: Secondary | ICD-10-CM | POA: Insufficient documentation

## 2016-03-13 DIAGNOSIS — I480 Paroxysmal atrial fibrillation: Secondary | ICD-10-CM | POA: Diagnosis not present

## 2016-03-13 DIAGNOSIS — S0990XA Unspecified injury of head, initial encounter: Secondary | ICD-10-CM | POA: Diagnosis not present

## 2016-03-13 DIAGNOSIS — Y92121 Bathroom in nursing home as the place of occurrence of the external cause: Secondary | ICD-10-CM | POA: Insufficient documentation

## 2016-03-13 DIAGNOSIS — I209 Angina pectoris, unspecified: Secondary | ICD-10-CM | POA: Diagnosis not present

## 2016-03-13 DIAGNOSIS — I5042 Chronic combined systolic (congestive) and diastolic (congestive) heart failure: Secondary | ICD-10-CM | POA: Insufficient documentation

## 2016-03-13 DIAGNOSIS — Y939 Activity, unspecified: Secondary | ICD-10-CM | POA: Diagnosis not present

## 2016-03-13 DIAGNOSIS — Z043 Encounter for examination and observation following other accident: Secondary | ICD-10-CM | POA: Diagnosis present

## 2016-03-13 DIAGNOSIS — E039 Hypothyroidism, unspecified: Secondary | ICD-10-CM | POA: Insufficient documentation

## 2016-03-13 DIAGNOSIS — Y999 Unspecified external cause status: Secondary | ICD-10-CM | POA: Diagnosis not present

## 2016-03-13 DIAGNOSIS — R2681 Unsteadiness on feet: Secondary | ICD-10-CM | POA: Insufficient documentation

## 2016-03-13 DIAGNOSIS — Z954 Presence of other heart-valve replacement: Secondary | ICD-10-CM | POA: Diagnosis not present

## 2016-03-13 DIAGNOSIS — Z79899 Other long term (current) drug therapy: Secondary | ICD-10-CM

## 2016-03-13 DIAGNOSIS — I2510 Atherosclerotic heart disease of native coronary artery without angina pectoris: Secondary | ICD-10-CM | POA: Insufficient documentation

## 2016-03-13 DIAGNOSIS — I110 Hypertensive heart disease with heart failure: Secondary | ICD-10-CM | POA: Insufficient documentation

## 2016-03-13 DIAGNOSIS — W010XXA Fall on same level from slipping, tripping and stumbling without subsequent striking against object, initial encounter: Secondary | ICD-10-CM | POA: Insufficient documentation

## 2016-03-13 DIAGNOSIS — Z87891 Personal history of nicotine dependence: Secondary | ICD-10-CM | POA: Insufficient documentation

## 2016-03-13 DIAGNOSIS — Z952 Presence of prosthetic heart valve: Secondary | ICD-10-CM

## 2016-03-13 DIAGNOSIS — W19XXXA Unspecified fall, initial encounter: Secondary | ICD-10-CM

## 2016-03-13 NOTE — ED Notes (Signed)
Patient comes from an extended care facility after a fall in their bathroom.  Denies any LOC or hitting his head. A&Ox4

## 2016-03-13 NOTE — ED Notes (Signed)
MAR sent with the patient from facility states he is on Eliquis

## 2016-03-13 NOTE — Patient Instructions (Addendum)
Medication Instructions:  Your physician recommends that you continue on your current medications as directed. Please refer to the Current Medication list given to you today.  Labwork: Your physician recommends that you return for lab work in: 4 MONTHS (CMP, TSH and BNP)  Testing/Procedures: A chest x-ray in 4 MONTHS takes a picture of the organs and structures inside the chest, including the heart, lungs, and blood vessels. This test can show several things, including, whether the heart is enlarges; whether fluid is building up in the lungs; and whether pacemaker / defibrillator leads are still in place. [facility]  Follow-Up: Your physician recommends that you schedule a follow-up appointment in: 4 MONTHS with Dr [name]   Any Other Special Instructions Will Be Listed Below (If Applicable).     If you need a refill on your cardiac medications before your next appointment, please call your pharmacy.

## 2016-03-13 NOTE — Progress Notes (Signed)
Cardiology Office Note Date:  03/13/2016   ID:  Eugene [name], DOB 1924-08-01, MRN [id number]  PCP:  [facility]  Cardiologist:  Sherren Mocha, MD    Chief Complaint  Patient presents with  . Hypertension    sob remains unchanged. no cp,lee,or claudication     History of Present Illness: Eugene [name] is a 82 y.o. male who presents for follow-up evaluation. He's followed for aortic valve disease, chronic diastolic heart failure, and CAD. His coronary artery disease dates back to the early 1990s when he presented with an inferior wall MI treated with balloon angioplasty of the right coronary artery. He underwent PCI of severe stenosis in the proximal LAD in 2013 after presenting with syncope and elevated cardiac enzymes. He was noted to have severe aortic stenosis at that time and underwent balloon aortic valvuloplasty. He also has peripheral arterial disease and has had nonhealing ulcerations. He underwent angioplasty of the left tibial artery in 2014. His severe aortic stenosis was ultimately treated with TAVR in November 2015 via a transfemoral approach. He's been treated with aspirin alone because of gastrointestinal bleeding on Plavix. However, he was hospitalized again with atrial fibrillation/flutter in February. He was started on Eliquis during that hospitalization.  The patient complains of shortness of breath with activity. He denies leg swelling, orthopnea, or PND. States he is able to catch his breath after a few seconds. He denies chest pain or pressure, lightheadedness, or syncope. He is living in a skilled nursing facility. The patient is here alone today. He denies cough, fever, or chills.  Past Medical History  Diagnosis Date  . Glaucoma     Right eye  . Diverticulosis     multiple hospital admissions for GI bleed, has an episode roughly every 6 months,, last  colonscopy in 2006 showed diverticulosis, was evaluated by Dr. Amedeo Plenty in the hospital in 2011 for lower  GI bleed andd it  was suggested that the patient was not actively bleeding at that time and given his comorbid conditions colonoscopy was deferred. He is also chronically constipated  . Macular degeneration of right eye   . Allergic rhinitis   . CAD (coronary artery disease)     a. h/o remote inferior MI, h/o PCI to RCA in 1991. b. syncope 2013: severe AS and high grade prox LAD and other diffuse disease. High risk for CABG - s/p BMS to prox LAD and balloon aortic valvuloplasty. c. cath 07/16/14 - diffuse CAD, managed medically.  . Hypertension   . Hyperlipidemia   . Chronic combined systolic and diastolic CHF (congestive heart failure) ([place])     a. Most recent echo 08/2014: EF 30-35%.  . Supraventricular tachycardia ([place])     2 syndromes-nonsustained atrial tachycardia//adenosine responsive diuretic positive reentry probably AV node reentry  . Aortic stenosis     a. s/p AV balloon valvuloplasty by Dr. Burt Knack 11/2011. b. s/p TAVR 08/2014.  . Ischemic cardiomyopathy     a. 2D ECHO 11/2014: EF of 20-25%  . H/O: GI bleed   . Hypothyroidism   . GERD (gastroesophageal reflux disease)   . Falls frequently     3 times in the past week/notes 09/11/2013  . PAF (paroxysmal atrial fibrillation) ([facility])     a. on amiodarone and eliquis   . Peripheral vascular disease ([place])     a. h/o ulcer on the left great toe s/p angioplasty of the left anterior tibial artery.  . OSA on CPAP   . Limited  mobility   . S/P TAVR (transcatheter aortic valve replacement)     a. 08/2014: 29 mm Edwards Sapien XT transcatheter heart valve placed via open left transfemoral approach  . PAT (paroxysmal atrial tachycardia) ([place])   . Atrial flutter [facility])     Past Surgical History  Procedure Laterality Date  . Glaucoma surgery      Implantation of Baerveldt glaucoma device lant with scleral reinforcement using tutoplast tissue graft right eye. [Other]  . Coronary angioplasty with stent placement  Jan. 23, 2013  . Corneal  transplant Right   . Angioplasty Left 03/30/2013    tibial  . Tonsillectomy  1946  . Cataract extraction w/ intraocular lens  implant, bilateral Bilateral   . Cardiac valve replacement  11/30/2011    aortic valve.?  . Transcatheter aortic valve replacement, transfemoral N/A 08/10/2014    Procedure: TRANSCATHETER AORTIC VALVE REPLACEMENT, TRANSFEMORAL;  Surgeon: Sherren Mocha, MD;  Location: [place] [facility];  Service: Open Heart Surgery;  Laterality: N/A;  . Tee without cardioversion N/A 08/10/2014    Procedure: TRANSESOPHAGEAL ECHOCARDIOGRAM (TEE);  Surgeon: Sherren Mocha, MD;  Location: [place];  Service: Open Heart Surgery;  Laterality: N/A;  . Coronary angiogram  11/02/2011    Procedure: CORONARY ANGIOGRAM;  Surgeon: Minus Breeding, MD;  Location: [facility] CATH LAB;  Service: Cardiovascular;;  . Left heart catheterization with coronary angiogram N/A 11/12/2011    Procedure: LEFT HEART CATHETERIZATION WITH CORONARY ANGIOGRAM;  Surgeon: Sherren Mocha, MD;  Location: [facility] CATH LAB;  Service: Cardiovascular;  Laterality: N/A;  . Percutaneous coronary stent intervention (pci-s) N/A 11/14/2011    Procedure: PERCUTANEOUS CORONARY STENT INTERVENTION (PCI-S);  Surgeon: Sherren Mocha, MD;  Location: [facility] CATH LAB;  Service: Cardiovascular;  Laterality: N/A;  . Abdominal aortagram N/A 03/30/2013    Procedure: ABDOMINAL Maxcine Ham;  Surgeon: Angelia Mould, MD;  Location: [facility] CATH LAB;  Service: Cardiovascular;  Laterality: N/A;  . Left and right heart catheterization with coronary angiogram N/A 07/16/2014    Procedure: LEFT AND RIGHT HEART CATHETERIZATION WITH CORONARY ANGIOGRAM;  Surgeon: Blane Ohara, MD;  Location: [facility] CATH LAB;  Service: Cardiovascular;  Laterality: N/A;    Current Outpatient Prescriptions  Medication Sig Dispense Refill  . acetaminophen (TYLENOL) 500 MG tablet Take 1,000 mg by mouth every 6 (six) hours as needed (pain).    Marland Kitchen amiodarone (PACERONE) 200 MG tablet Take 1 tablet (200 mg total) by mouth  2 (two) times daily. 60 tablet 11  . apixaban (ELIQUIS) 2.5 MG TABS tablet Take 1 tablet (2.5 mg total) by mouth 2 (two) times daily. 60 tablet 11  . buPROPion (WELLBUTRIN) 75 MG tablet Take 75 mg by mouth 2 (two) times daily.    . Calcium Carb-Cholecalciferol (CALCIUM 600+D) 600-800 MG-UNIT TABS Take 1 tablet by mouth daily.    . carboxymethylcellulose (REFRESH) 1 % ophthalmic solution Place 4 drops into both eyes 4 (four) times daily.    . cetirizine (ZYRTEC) 10 MG tablet Take 10 mg by mouth daily.    . furosemide (LASIX) 20 MG tablet Take 60 mg by mouth daily.    . hydrALAZINE (APRESOLINE) 25 MG tablet Take 1 tablet (25 mg total) by mouth 2 (two) times daily. 60 tablet 3  . isosorbide mononitrate (IMDUR) 30 MG 24 hr tablet Take 1 tablet (30 mg total) by mouth daily. 30 tablet 3  . Multiple Vitamin (MULTIVITAMIN) tablet Take 1 tablet by mouth at bedtime.    . nitroGLYCERIN (NITROSTAT) 0.4 MG SL tablet Place 1 tablet (0.4  mg total) under the tongue every 5 (five) minutes x 3 doses as needed for chest pain. 25 tablet 12  . omeprazole (PRILOSEC) 20 MG capsule Take 20 mg by mouth daily.    . polyethylene glycol (MIRALAX / GLYCOLAX) packet Take 17 g by mouth as needed. Take one packet mixed with liquid by mouth on Monday, Wednesday, and Friday    . potassium chloride (K-DUR) 10 MEQ tablet Take 30 mEq by mouth daily.    . pravastatin (PRAVACHOL) 40 MG tablet Take 1 tablet (40 mg total) by mouth daily. 90 tablet 4  . Skin Protectants, Misc. (BAZA PROTECT EX) Apply 1 application topically 3 (three) times daily.    . tamsulosin (FLOMAX) 0.4 MG CAPS capsule Take 1 capsule (0.4 mg total) by mouth daily. 30 capsule 11  . terazosin (HYTRIN) 1 MG capsule Take 1 mg by mouth daily.    . traMADol (ULTRAM) 50 MG tablet Take 50 mg by mouth every 12 (twelve) hours as needed for moderate pain.    . Travoprost, BAK Free, (TRAVATAN) 0.004 % SOLN ophthalmic solution Place 1 drop into the right eye at bedtime.    .  vitamin B-12 (CYANOCOBALAMIN) 1000 MCG tablet Take 1,000 mcg by mouth daily.     No current facility-administered medications for this visit.    Allergies:   Corn-containing products and Penicillins   Social History:  The patient  reports that he quit smoking about 57 years ago. His smoking use included Cigarettes. He has a 63 pack-year smoking history. He has never used smokeless tobacco. He reports that he does not drink alcohol or use illicit drugs.   Family History:  The patient's  family history includes Cancer in his father and mother; Heart disease in his sister; Stroke in his sister. There is no history of Heart attack.    ROS:  Please see the history of present illness.  Otherwise, review of systems is positive for .  All other systems are reviewed and negative.    PHYSICAL EXAM: VS:  BP 130/70 mmHg  Pulse 80  Ht _0  (1.702 m)  Wt 194 lb (87.998 kg)  BMI 30.38 kg/m2 , BMI Body mass index is 30.38 kg/(m^2). GEN: Pleasant elderly male, in no acute distress HEENT: normal Neck: no JVD, no masses. No carotid bruits Cardiac: RRR without murmur or gallop                Respiratory:  clear to auscultation bilaterally, normal work of breathing GI: soft, nontender, nondistended, + BS MS: no deformity or atrophy Ext: trace pretibial edema Skin: warm and dry, no rash Neuro:  Strength and sensation are intact Psych: euthymic mood, full affect  EKG:  EKG is not ordered today.  Recent Labs: 01/17/2016: ALT 15*; B Natriuretic Peptide 218.3* 01/30/2016: BUN 23; Creat 1.21*; Hemoglobin 12.7*; Platelets 171; Potassium 4.1; Sodium 141   Lipid Panel     Component Value Date/Time   CHOL 154 02/03/2015 0943   TRIG 267* 02/03/2015 0943   HDL 39* 02/03/2015 0943   CHOLHDL 3.9 02/03/2015 0943   VLDL 53* 02/03/2015 0943   LDLCALC 62 02/03/2015 0943      Wt Readings from Last 3 Encounters:  03/13/16 194 lb (87.998 kg)  01/20/16 201 lb 11.2 oz (91.491 kg)  07/12/15 202 lb 4.8 oz  (91.763 kg)     Cardiac Studies Reviewed: 2D Echo 01-18-2016: Study Conclusions  - Left ventricle: The cavity size was normal. There was moderate  concentric  hypertrophy. Systolic function was normal. The  estimated ejection fraction was in the range of 55% to 60%. There  was an increased relative contribution of atrial contraction to  ventricular filling. Doppler parameters are consistent with  abnormal left ventricular relaxation (grade 1 diastolic  dysfunction). - Ventricular septum: Septal motion showed moderate paradox. These  changes are consistent with intraventricular conduction delay. - Aortic valve: S/P TAVR which appears well seated with normal  function. Peak velocity (S): 184 cm/s. Mean gradient (S): 6 mm  Hg. Valve area (VTI): 2.61 cm^2. Valve area (Vmax): 2.68 cm^2.  Valve area (Vmean): 2.45 cm^2. - Mitral valve: Calcified annulus. - Left atrium: The atrium was mildly dilated.  ASSESSMENT AND PLAN: 1.  CAD, native vessel: no symptoms of angina. Medications reviewed. No beta-blocker because of bradyarrrhythmias.  2. Chronic diastolic heart failure, NYHA III: difficult to sort out symptoms with his advancing age. He's become quite debilitated but clearly has significant shortness of breath. He's no volume-overloaded on exam. I have reviewed his recent echo and CXR (clear lung fields). Will continue current Rx's.   3. Paroxysmal atrial fibrillation and flutter. Continue amiodarone for rhythm-control and Eliquis for anticoagulation. If he remains in sinus at FU consider decrease amiodarone to 200 mg daily. Check thyroid function, LFT's, and CXR prior to RTN visit in 4 months.  4. Aortic valve disease S/P TAVR: normal valve function on last echo.   Current medicines are reviewed with the patient today.  The patient does not have concerns regarding medicines.  Labs/ tests ordered today include:   Orders Placed This Encounter  Procedures  . DG Chest 2 View  .  Comp Met (CMET)  . TSH  . B Nat Peptide    Disposition:   FU 4 months  Signed, Sherren Mocha, MD  03/13/2016 2:53 PM    [facility] [address], [place], [place]  [address] Phone: [phone]; Fax: [phone]

## 2016-03-14 ENCOUNTER — Emergency Department (HOSPITAL_COMMUNITY): Payer: Medicare Other

## 2016-03-14 DIAGNOSIS — Z043 Encounter for examination and observation following other accident: Secondary | ICD-10-CM | POA: Diagnosis not present

## 2016-03-14 NOTE — ED Notes (Signed)
Patient back from X-Ray.

## 2016-03-14 NOTE — Discharge Instructions (Signed)
Follow up with your primary care provider for re-evaluation. Please ambulate with assistance at home to prevent further falls. Return to the ED if you experience repeat falls, severe headache, loss of consciousness, blurry vision, weakness, dizziness, chest pain or shortness of breath.

## 2016-03-14 NOTE — ED Notes (Signed)
Pt back from CT, now going  For shoulder x-ray

## 2016-03-14 NOTE — ED Notes (Signed)
CALLED [facility]--Jader Desai  

## 2016-03-14 NOTE — ED Notes (Signed)
Patient stable after evaluation for a fall.  [facility] to transport back to [facility].  Patient A&Ox4.  Belongings sent with patient

## 2016-03-14 NOTE — ED Provider Notes (Signed)
CSN: [id number]     Arrival date & time 03/13/16  2319 History   First MD Initiated Contact with Patient 03/13/16 2329     Chief Complaint  Patient presents with  . Fall     (Consider location/radiation/quality/duration/timing/severity/associated sxs/prior Treatment) HPI   Eugene [name] is an 82 year old male with a past medical history of CAD, HTN, HLD, CHF, aortic stenosis s/p valvuloplasty, A. fib who presents to the ED today to be evaluated after a fall. Patient states that he was at his skilled nursing facility using the bathroom when the nurse who was with him left him on the toilet. Patient states he waited for her to come back for greater than 20 minutes when he became impatient and tried to stand up and get back in his wheelchair. Patient states he does not typically ambulate daily and has not for the last 7 years. He states he slipped and fell backwards landing on his right side and back. He denies hitting his head or losing consciousness. Patient is on Eliquis for atrial fibrillation. Patient denies pain, headache, change in vision, increased weakness, paresthesias, dizziness.  Past Medical History  Diagnosis Date  . Glaucoma     Right eye  . Diverticulosis     multiple hospital admissions for GI bleed, has an episode roughly every 6 months,, last  colonscopy in 2006 showed diverticulosis, was evaluated by Dr. Madilyn FiremanHayes in the hospital in 2011 for lower GI bleed andd it  was suggested that the patient was not actively bleeding at that time and given his comorbid conditions colonoscopy was deferred. He is also chronically constipated  . Macular degeneration of right eye   . Allergic rhinitis   . CAD (coronary artery disease)     a. h/o remote inferior MI, h/o PCI to RCA in 1991. b. syncope 2013: severe AS and high grade prox LAD and other diffuse disease. High risk for CABG - s/p BMS to prox LAD and balloon aortic valvuloplasty. c. cath 07/16/14 - diffuse CAD, managed medically.  .  Hypertension   . Hyperlipidemia   . Chronic combined systolic and diastolic CHF (congestive heart failure) (HCC)     a. Most recent echo 08/2014: EF 30-35%.  . Supraventricular tachycardia (HCC)     2 syndromes-nonsustained atrial tachycardia//adenosine responsive diuretic positive reentry probably AV node reentry  . Aortic stenosis     a. s/p AV balloon valvuloplasty by Dr. [name] 11/2011. b. s/p TAVR 08/2014.  . Ischemic cardiomyopathy     a. 2D ECHO 11/2014: EF of 20-25%  . H/O: GI bleed   . Hypothyroidism   . GERD (gastroesophageal reflux disease)   . Falls frequently     3 times in the past week/notes 09/11/2013  . PAF (paroxysmal atrial fibrillation) (HCC)     a. on amiodarone and eliquis   . Peripheral vascular disease (HCC)     a. h/o ulcer on the left great toe s/p angioplasty of the left anterior tibial artery.  . OSA on CPAP   . Limited mobility   . S/P TAVR (transcatheter aortic valve replacement)     a. 08/2014: 29 mm Edwards Sapien XT transcatheter heart valve placed via open left transfemoral approach  . PAT (paroxysmal atrial tachycardia) (HCC)   . Atrial flutter [facility](HCC)    Past Surgical History  Procedure Laterality Date  . Glaucoma surgery      Implantation of Baerveldt glaucoma device lant with scleral reinforcement using tutoplast tissue graft right eye. [Other]  .  Coronary angioplasty with stent placement  Jan. 23, 2013  . Corneal transplant Right   . Angioplasty Left 03/30/2013    tibial  . Tonsillectomy  1946  . Cataract extraction w/ intraocular lens  implant, bilateral Bilateral   . Cardiac valve replacement  11/30/2011    aortic valve.?  . Transcatheter aortic valve replacement, transfemoral N/A 08/10/2014    Procedure: TRANSCATHETER AORTIC VALVE REPLACEMENT, TRANSFEMORAL;  Surgeon: Tonny Bollman, MD;  Location: [facility] OR;  Service: Open Heart Surgery;  Laterality: N/A;  . Tee without cardioversion N/A 08/10/2014    Procedure: TRANSESOPHAGEAL ECHOCARDIOGRAM  (TEE);  Surgeon: Tonny Bollman, MD;  Location: [facility] OR;  Service: Open Heart Surgery;  Laterality: N/A;  . Coronary angiogram  11/02/2011    Procedure: CORONARY ANGIOGRAM;  Surgeon: Rollene Rotunda, MD;  Location: [facility] CATH LAB;  Service: Cardiovascular;;  . Left heart catheterization with coronary angiogram N/A 11/12/2011    Procedure: LEFT HEART CATHETERIZATION WITH CORONARY ANGIOGRAM;  Surgeon: Tonny Bollman, MD;  Location: [facility] CATH LAB;  Service: Cardiovascular;  Laterality: N/A;  . Percutaneous coronary stent intervention (pci-s) N/A 11/14/2011    Procedure: PERCUTANEOUS CORONARY STENT INTERVENTION (PCI-S);  Surgeon: Tonny Bollman, MD;  Location: [facility] CATH LAB;  Service: Cardiovascular;  Laterality: N/A;  . Abdominal aortagram N/A 03/30/2013    Procedure: ABDOMINAL Ronny Flurry;  Surgeon: Chuck Hint, MD;  Location: [facility] CATH LAB;  Service: Cardiovascular;  Laterality: N/A;  . Left and right heart catheterization with coronary angiogram N/A 07/16/2014    Procedure: LEFT AND RIGHT HEART CATHETERIZATION WITH CORONARY ANGIOGRAM;  Surgeon: Micheline Chapman, MD;  Location: [facility] CATH LAB;  Service: Cardiovascular;  Laterality: N/A;   Family History  Problem Relation Age of Onset  . Cancer Mother     Ovarian Cancer  . Cancer Father     Lung  . Heart disease Sister     Heart disease before age 21  . Stroke Sister   . Heart attack Neg Hx    Social History  Substance Use Topics  . Smoking status: Former Smoker -- 3.00 packs/day for 21 years    Types: Cigarettes    Quit date: 10/08/1958  . Smokeless tobacco: Never Used  . Alcohol Use: No    Review of Systems  All other systems reviewed and are negative.     Allergies  Corn-containing products and Penicillins  Home Medications   Prior to Admission medications   Medication Sig Start Date End Date Taking? Authorizing Provider  acetaminophen (TYLENOL) 500 MG tablet Take 1,000 mg by mouth every 6 (six) hours as needed (pain).    Historical  Provider, MD  amiodarone (PACERONE) 200 MG tablet Take 1 tablet (200 mg total) by mouth 2 (two) times daily. 12/02/14   Janetta Hora, PA-C  apixaban (ELIQUIS) 2.5 MG TABS tablet Take 1 tablet (2.5 mg total) by mouth 2 (two) times daily. 12/02/14   Janetta Hora, PA-C  buPROPion (WELLBUTRIN) 75 MG tablet Take 75 mg by mouth 2 (two) times daily.    Historical Provider, MD  Calcium Carb-Cholecalciferol (CALCIUM 600+D) 600-800 MG-UNIT TABS Take 1 tablet by mouth daily.    Historical Provider, MD  carboxymethylcellulose (REFRESH) 1 % ophthalmic solution Place 4 drops into both eyes 4 (four) times daily.    Historical Provider, MD  cetirizine (ZYRTEC) 10 MG tablet Take 10 mg by mouth daily.    Historical Provider, MD  furosemide (LASIX) 20 MG tablet Take 60 mg by mouth daily.    Historical  Provider, MD  hydrALAZINE (APRESOLINE) 25 MG tablet Take 1 tablet (25 mg total) by mouth 2 (two) times daily. 01/20/16   Ripudeep Jenna Luo, MD  isosorbide mononitrate (IMDUR) 30 MG 24 hr tablet Take 1 tablet (30 mg total) by mouth daily. 01/20/16   Ripudeep Jenna Luo, MD  Multiple Vitamin (MULTIVITAMIN) tablet Take 1 tablet by mouth at bedtime.    Historical Provider, MD  nitroGLYCERIN (NITROSTAT) 0.4 MG SL tablet Place 1 tablet (0.4 mg total) under the tongue every 5 (five) minutes x 3 doses as needed for chest pain. 12/02/14   Janetta Hora, PA-C  omeprazole (PRILOSEC) 20 MG capsule Take 20 mg by mouth daily.    Historical Provider, MD  polyethylene glycol (MIRALAX / GLYCOLAX) packet Take 17 g by mouth as needed. Take one packet mixed with liquid by mouth on Monday, Wednesday, and Friday    Historical Provider, MD  potassium chloride (K-DUR) 10 MEQ tablet Take 30 mEq by mouth daily.    Historical Provider, MD  pravastatin (PRAVACHOL) 40 MG tablet Take 1 tablet (40 mg total) by mouth daily. 06/17/14   Earl Lagos, MD  Skin Protectants, Misc. (BAZA PROTECT EX) Apply 1 application topically 3 (three) times daily.     Historical Provider, MD  tamsulosin (FLOMAX) 0.4 MG CAPS capsule Take 1 capsule (0.4 mg total) by mouth daily. 12/02/14   Janetta Hora, PA-C  terazosin (HYTRIN) 1 MG capsule Take 1 mg by mouth daily.    Historical Provider, MD  traMADol (ULTRAM) 50 MG tablet Take 50 mg by mouth every 12 (twelve) hours as needed for moderate pain.    Historical Provider, MD  Travoprost, BAK Free, (TRAVATAN) 0.004 % SOLN ophthalmic solution Place 1 drop into the right eye at bedtime.    Historical Provider, MD  vitamin B-12 (CYANOCOBALAMIN) 1000 MCG tablet Take 1,000 mcg by mouth daily.    Historical Provider, MD   BP 168/90 mmHg  Pulse 62  Resp 18  SpO2 95% Physical Exam  Constitutional: He is oriented to person, place, and time. He appears well-developed and well-nourished. No distress.  HENT:  Head: Normocephalic and atraumatic.  Mouth/Throat: No oropharyngeal exudate.  Eyes: Conjunctivae and EOM are normal. Pupils are equal, round, and reactive to light. Right eye exhibits no discharge. Left eye exhibits no discharge. No scleral icterus.  Neck: Neck supple.  Cardiovascular: Normal rate, regular rhythm, normal heart sounds and intact distal pulses.  Exam reveals no gallop and no friction rub.   No murmur heard. Pulmonary/Chest: Effort normal and breath sounds normal. No respiratory distress. He has no wheezes. He has no rales. He exhibits no tenderness.  Abdominal: Soft. He exhibits no distension. There is no tenderness. There is no guarding.  Musculoskeletal: Normal range of motion. He exhibits no edema.  No TTP of right shoulder. Decrease active range of motion of right shoulder. Normal passive range of motion. No obvious bony deformity.  No midline spinal tenderness. Full range of motion of C, T, L-spine. No step-offs or obvious bony deformities.  Neurological: He is alert and oriented to person, place, and time. No cranial nerve deficit.   No sensory deficits. Strength 3 out of 5 bilateral  lower extremities. Strength 5 out of 5 in bilateral upper extremities.   Skin: Skin is warm and dry. No rash noted. He is not diaphoretic. No erythema. No pallor.  Psychiatric: He has a normal mood and affect. His behavior is normal.  Nursing note and vitals reviewed.  ED Course  Procedures (including critical care time) Labs Review Labs Reviewed - No data to display  Imaging Review Dg Shoulder Right  03/14/2016  CLINICAL DATA:  Fall with right shoulder pain.  Initial encounter. EXAM: RIGHT SHOULDER - 2+ VIEW COMPARISON:  Previous chest x-rays, most helpful 06/27/2015. No comparison shoulder imaging. FINDINGS: Remote right humeral neck and upper diaphysis fracture with apex medial angulation. Distortion and residual lucency appears stable from 2016 chest x-ray. No acute fracture is detected. No dislocation. High humeral head with sub acromial narrowing, likely chronic rotator cuff tear. Osteopenia. IMPRESSION: Remote proximal right humerus fracture. No acute superimposed finding when compared to priors. Electronically Signed   By: Marnee Spring M.D.   On: 03/14/2016 00:27   Ct Head Wo Contrast  03/14/2016  CLINICAL DATA:  82 year old male with fall and head injury. EXAM: CT HEAD WITHOUT CONTRAST CT CERVICAL SPINE WITHOUT CONTRAST TECHNIQUE: Multidetector CT imaging of the head and cervical spine was performed following the standard protocol without intravenous contrast. Multiplanar CT image reconstructions of the cervical spine were also generated. COMPARISON:  Head CT dated 07/12/2015 FINDINGS: CT HEAD FINDINGS There is moderate age-related atrophy and chronic microvascular ischemic changes. There is no acute intracranial hemorrhage. No mass effect or midline shift noted. There is a small old left caudate the coronal infarct. The visualized paranasal sinuses and mastoid air cells are clear. The calvarium is intact. There is hyperostosis frontalis. CT CERVICAL SPINE FINDINGS There is no acute  fracture or subluxation of the cervical spine.There is osteopenia with multilevel degenerative changes and osteophyte formation.The odontoid and spinous processes are intact.There is normal anatomic alignment of the C1-C2 lateral masses. The visualized soft tissues appear unremarkable. There is atherosclerotic calcification of the visualized aortic arch IMPRESSION: No acute intracranial hemorrhage. Age-related atrophy and chronic microvascular ischemic disease. No acute/traumatic cervical spine pathology. Electronically Signed   By: Elgie Collard M.D.   On: 03/14/2016 01:18   Ct Cervical Spine Wo Contrast  03/14/2016  CLINICAL DATA:  82 year old male with fall and head injury. EXAM: CT HEAD WITHOUT CONTRAST CT CERVICAL SPINE WITHOUT CONTRAST TECHNIQUE: Multidetector CT imaging of the head and cervical spine was performed following the standard protocol without intravenous contrast. Multiplanar CT image reconstructions of the cervical spine were also generated. COMPARISON:  Head CT dated 07/12/2015 FINDINGS: CT HEAD FINDINGS There is moderate age-related atrophy and chronic microvascular ischemic changes. There is no acute intracranial hemorrhage. No mass effect or midline shift noted. There is a small old left caudate the coronal infarct. The visualized paranasal sinuses and mastoid air cells are clear. The calvarium is intact. There is hyperostosis frontalis. CT CERVICAL SPINE FINDINGS There is no acute fracture or subluxation of the cervical spine.There is osteopenia with multilevel degenerative changes and osteophyte formation.The odontoid and spinous processes are intact.There is normal anatomic alignment of the C1-C2 lateral masses. The visualized soft tissues appear unremarkable. There is atherosclerotic calcification of the visualized aortic arch IMPRESSION: No acute intracranial hemorrhage. Age-related atrophy and chronic microvascular ischemic disease. No acute/traumatic cervical spine pathology.  Electronically Signed   By: Elgie Collard M.D.   On: 03/14/2016 01:18   I have personally reviewed and evaluated these images and lab results as part of my medical decision-making.   EKG Interpretation None      MDM   Final diagnoses:  Fall, initial encounter    82 year old male presents to the ED from nursing facility to be evaluated after mechanical fall. Patient attempted to stand up  from the toilet and fell landing on his back and right side. He denies striking his head or losing consciousness. He is on Eliquis. Patient is alert and oriented 4. He denies any pain in appears well in the ED, in no apparent distress. He is decreased strength bilaterally in his lower extremities. This is patient's baseline. He does not ambulate typically, he uses a wheelchair and has been for the last 7 years per patient. I spoke to nursing staff patient's nursing facility. The nurse that witnessed this fall was not currently working but there was a report that states that patient was found laying on his back. CT head and neck did not reveal any acute abnormality. X-ray right shoulder reveals remote proximal right humerus fracture. I compared this x-ray to the previous one from 2016 and it appears unchanged. The patient appears safe for discharge back to his nursing facility.  Patient was discussed with and seen by Dr. Wilkie Aye who agrees with the treatment plan.      Lester Kinsman [place], PA-C 03/14/16 1610  Shon Baton, MD 03/14/16 2306

## 2016-03-19 ENCOUNTER — Encounter (HOSPITAL_COMMUNITY): Payer: Self-pay

## 2016-03-19 ENCOUNTER — Emergency Department (HOSPITAL_COMMUNITY)
Admission: EM | Admit: 2016-03-19 | Discharge: 2016-03-19 | Disposition: A | Discharge status: Home / Self Care | Payer: Medicare Other | Attending: Emergency Medicine | Admitting: Emergency Medicine

## 2016-03-19 DIAGNOSIS — Z7901 Long term (current) use of anticoagulants: Secondary | ICD-10-CM | POA: Diagnosis not present

## 2016-03-19 DIAGNOSIS — Z87891 Personal history of nicotine dependence: Secondary | ICD-10-CM | POA: Diagnosis not present

## 2016-03-19 DIAGNOSIS — R112 Nausea with vomiting, unspecified: Secondary | ICD-10-CM | POA: Insufficient documentation

## 2016-03-19 DIAGNOSIS — I480 Paroxysmal atrial fibrillation: Secondary | ICD-10-CM | POA: Diagnosis not present

## 2016-03-19 DIAGNOSIS — I2510 Atherosclerotic heart disease of native coronary artery without angina pectoris: Secondary | ICD-10-CM | POA: Insufficient documentation

## 2016-03-19 DIAGNOSIS — I4892 Unspecified atrial flutter: Secondary | ICD-10-CM | POA: Insufficient documentation

## 2016-03-19 DIAGNOSIS — I5042 Chronic combined systolic (congestive) and diastolic (congestive) heart failure: Secondary | ICD-10-CM | POA: Insufficient documentation

## 2016-03-19 DIAGNOSIS — I110 Hypertensive heart disease with heart failure: Secondary | ICD-10-CM | POA: Diagnosis not present

## 2016-03-19 DIAGNOSIS — E785 Hyperlipidemia, unspecified: Secondary | ICD-10-CM | POA: Insufficient documentation

## 2016-03-19 DIAGNOSIS — E039 Hypothyroidism, unspecified: Secondary | ICD-10-CM | POA: Diagnosis not present

## 2016-03-19 DIAGNOSIS — Z79899 Other long term (current) drug therapy: Secondary | ICD-10-CM | POA: Diagnosis not present

## 2016-03-19 LAB — I-STAT CHEM 8, ED
BUN: 30 mg/dL — ABNORMAL HIGH (ref 6–20)
CALCIUM ION: 1.17 mmol/L (ref 1.13–1.30)
CREATININE: 1.8 mg/dL — AB (ref 0.61–1.24)
Chloride: 106 mmol/L (ref 101–111)
GLUCOSE: 99 mg/dL (ref 65–99)
HCT: 39 % (ref 39.0–52.0)
HEMOGLOBIN: 13.3 g/dL (ref 13.0–17.0)
POTASSIUM: 4.9 mmol/L (ref 3.5–5.1)
Sodium: 140 mmol/L (ref 135–145)
TCO2: 22 mmol/L (ref 0–100)

## 2016-03-19 LAB — CBC WITH DIFFERENTIAL/PLATELET
BASOS ABS: 0 10*3/uL (ref 0.0–0.1)
Basophils Relative: 1 %
EOS ABS: 0.1 10*3/uL (ref 0.0–0.7)
EOS PCT: 1 %
HCT: 40.6 % (ref 39.0–52.0)
Hemoglobin: 12.7 g/dL — ABNORMAL LOW (ref 13.0–17.0)
LYMPHS PCT: 12 %
Lymphs Abs: 1.1 10*3/uL (ref 0.7–4.0)
MCH: 29.5 pg (ref 26.0–34.0)
MCHC: 31.3 g/dL (ref 30.0–36.0)
MCV: 94.2 fL (ref 78.0–100.0)
MONO ABS: 0.8 10*3/uL (ref 0.1–1.0)
Monocytes Relative: 9 %
Neutro Abs: 6.7 10*3/uL (ref 1.7–7.7)
Neutrophils Relative %: 77 %
PLATELETS: 161 10*3/uL (ref 150–400)
RBC: 4.31 MIL/uL (ref 4.22–5.81)
RDW: 14.9 % (ref 11.5–15.5)
WBC: 8.7 10*3/uL (ref 4.0–10.5)

## 2016-03-19 LAB — URINALYSIS, ROUTINE W REFLEX MICROSCOPIC
Bilirubin Urine: NEGATIVE
GLUCOSE, UA: NEGATIVE mg/dL
HGB URINE DIPSTICK: NEGATIVE
KETONES UR: NEGATIVE mg/dL
Leukocytes, UA: NEGATIVE
Nitrite: NEGATIVE
PROTEIN: NEGATIVE mg/dL
Specific Gravity, Urine: 1.011 (ref 1.005–1.030)
pH: 6.5 (ref 5.0–8.0)

## 2016-03-19 MED ORDER — ONDANSETRON HCL 8 MG PO TABS: 8.0000 mg | ORAL_TABLET | Freq: Three times a day (TID) | ORAL | Status: AC | PRN

## 2016-03-19 MED ORDER — SODIUM CHLORIDE 0.9 % IV BOLUS (SEPSIS)
500.0000 mL | Freq: Once | INTRAVENOUS | Status: AC
  Administered 2016-03-19: 500 mL via INTRAVENOUS

## 2016-03-19 NOTE — ED Provider Notes (Signed)
CSN: [id number]     Arrival date & time 03/19/16  1401 History  By signing my name below, I, Tanda Rockers, attest that this documentation has been prepared under the direction and in the presence of Mancel Bale, MD. Electronically Signed: Tanda Rockers, ED Scribe. 03/19/2016. 2:11 PM.   Chief Complaint  Patient presents with  . Weakness   The history is provided by the patient. No language interpreter was used.    HPI Comments: Eugene [name] is a 82 y.o. male brought in by ambulance, with PMHx CAD, HTN, HLD, and CHF who presents to the Emergency Department for 3 episodes of vomiting that occurred earlier today. Pt is a resident at a nursing facility and was sent for the vomiting. Pt is alert and oriented to person, place, and time. No previous abdominal surgeries. Pt ambulates with a walker. Denies pain, diarrhea, abdominal pain, headache, neck pain, or any other associated symptoms.   Past Medical History  Diagnosis Date  . Glaucoma     Right eye  . Diverticulosis     multiple hospital admissions for GI bleed, has an episode roughly every 6 months,, last  colonscopy in 2006 showed diverticulosis, was evaluated by Dr. Madilyn Fireman in the hospital in 2011 for lower GI bleed andd it  was suggested that the patient was not actively bleeding at that time and given his comorbid conditions colonoscopy was deferred. He is also chronically constipated  . Macular degeneration of right eye   . Allergic rhinitis   . CAD (coronary artery disease)     a. h/o remote inferior MI, h/o PCI to RCA in 1991. b. syncope 2013: severe AS and high grade prox LAD and other diffuse disease. High risk for CABG - s/p BMS to prox LAD and balloon aortic valvuloplasty. c. cath 07/16/14 - diffuse CAD, managed medically.  . Hypertension   . Hyperlipidemia   . Chronic combined systolic and diastolic CHF (congestive heart failure) (HCC)     a. Most recent echo 08/2014: EF 30-35%.  . Supraventricular tachycardia (HCC)     2  syndromes-nonsustained atrial tachycardia//adenosine responsive diuretic positive reentry probably AV node reentry  . Aortic stenosis     a. s/p AV balloon valvuloplasty by Dr. Excell Seltzer 11/2011. b. s/p TAVR 08/2014.  . Ischemic cardiomyopathy     a. 2D ECHO 11/2014: EF of 20-25%  . H/O: GI bleed   . Hypothyroidism   . GERD (gastroesophageal reflux disease)   . Falls frequently     3 times in the past week/notes 09/11/2013  . PAF (paroxysmal atrial fibrillation) (HCC)     a. on amiodarone and eliquis   . Peripheral vascular disease (HCC)     a. h/o ulcer on the left great toe s/p angioplasty of the left anterior tibial artery.  . OSA on CPAP   . Limited mobility   . S/P TAVR (transcatheter aortic valve replacement)     a. 08/2014: 29 mm Edwards Sapien XT transcatheter heart valve placed via open left transfemoral approach  . PAT (paroxysmal atrial tachycardia) (HCC)   . Atrial flutter [facility])    Past Surgical History  Procedure Laterality Date  . Glaucoma surgery      Implantation of Baerveldt glaucoma device lant with scleral reinforcement using tutoplast tissue graft right eye. [Other]  . Coronary angioplasty with stent placement  Jan. 23, 2013  . Corneal transplant Right   . Angioplasty Left 03/30/2013    tibial  . Tonsillectomy  1946  .  Cataract extraction w/ intraocular lens  implant, bilateral Bilateral   . Cardiac valve replacement  11/30/2011    aortic valve.?  . Transcatheter aortic valve replacement, transfemoral N/A 08/10/2014    Procedure: TRANSCATHETER AORTIC VALVE REPLACEMENT, TRANSFEMORAL;  Surgeon: Tonny BollmanMichael Cooper, MD;  [facility];  Service: Open Heart Surgery;  Laterality: N/A;  . Tee without cardioversion N/A 08/10/2014    Procedure: TRANSESOPHAGEAL ECHOCARDIOGRAM (TEE);  Surgeon: Tonny BollmanMichael Cooper, MD;  [facility];  Service: Open Heart Surgery;  Laterality: N/A;  . Coronary angiogram  11/02/2011    Procedure: CORONARY ANGIOGRAM;  Surgeon: Rollene RotundaJames Hochrein, MD;   [facility];  Service: Cardiovascular;;  . Left heart catheterization with coronary angiogram N/A 11/12/2011    Procedure: LEFT HEART CATHETERIZATION WITH CORONARY ANGIOGRAM;  Surgeon: Tonny BollmanMichael Cooper, MD;  [facility];  Service: Cardiovascular;  Laterality: N/A;  . Percutaneous coronary stent intervention (pci-s) N/A 11/14/2011    Procedure: PERCUTANEOUS CORONARY STENT INTERVENTION (PCI-S);  Surgeon: Tonny BollmanMichael Cooper, MD;  [facility];  Service: Cardiovascular;  Laterality: N/A;  . Abdominal aortagram N/A 03/30/2013    Procedure: ABDOMINAL Ronny FlurryAORTAGRAM;  Surgeon: Chuck Hinthristopher S Dickson, MD;  [facility];  Service: Cardiovascular;  Laterality: N/A;  . Left and right heart catheterization with coronary angiogram N/A 07/16/2014    Procedure: LEFT AND RIGHT HEART CATHETERIZATION WITH CORONARY ANGIOGRAM;  Surgeon: Micheline ChapmanMichael D Cooper, MD;  [facility];  Service: Cardiovascular;  Laterality: N/A;   Family History  Problem Relation Age of Onset  . Cancer Mother     Ovarian Cancer  . Cancer Father     Lung  . Heart disease Sister     Heart disease before age 960  . Stroke Sister   . Heart attack Neg Hx    Social History  Substance Use Topics  . Smoking status: Former Smoker -- 3.00 packs/day for 21 years    Types: Cigarettes    Quit date: 10/08/1958  . Smokeless tobacco: Never Used  . Alcohol Use: No    Review of Systems  Gastrointestinal: Positive for nausea and vomiting. Negative for abdominal pain and diarrhea.  Musculoskeletal: Negative for myalgias, arthralgias and neck pain.  Neurological: Negative for headaches.  All other systems reviewed and are negative.  Allergies  Corn-containing products and Penicillins  Home Medications   Prior to Admission medications   Medication Sig Start Date End Date Taking? Authorizing Provider  acetaminophen (TYLENOL) 500 MG tablet Take 1,000 mg by mouth every 6 (six) hours as needed (pain).   Yes Historical  Provider, MD  amiodarone (PACERONE) 200 MG tablet Take 1 tablet (200 mg total) by mouth 2 (two) times daily. 12/02/14  Yes Janetta HoraKathryn R Thompson, PA-C  apixaban (ELIQUIS) 2.5 MG TABS tablet Take 1 tablet (2.5 mg total) by mouth 2 (two) times daily. 12/02/14  Yes Janetta HoraKathryn R Thompson, PA-C  buPROPion (WELLBUTRIN) 100 MG tablet Take 100 mg by mouth 2 (two) times daily.   Yes Historical Provider, MD  Calcium Carb-Cholecalciferol (CALCIUM 600+D) 600-800 MG-UNIT TABS Take 1 tablet by mouth daily.   Yes Historical Provider, MD  carboxymethylcellulose (REFRESH) 1 % ophthalmic solution Place 4 drops into both eyes 4 (four) times daily.   Yes Historical Provider, MD  cetirizine (ZYRTEC) 10 MG tablet Take 10 mg by mouth daily.   Yes Historical Provider, MD  docusate sodium (COLACE) 100 MG capsule Take 100 mg by mouth 2 (two) times daily as needed (constipation).   Yes Historical  Provider, MD  furosemide (LASIX) 20 MG tablet Take 60 mg by mouth daily.   Yes Historical Provider, MD  hydrALAZINE (APRESOLINE) 25 MG tablet Take 1 tablet (25 mg total) by mouth 2 (two) times daily. 01/20/16  Yes Ripudeep Jenna Luo, MD  isosorbide mononitrate (IMDUR) 30 MG 24 hr tablet Take 1 tablet (30 mg total) by mouth daily. 01/20/16  Yes Ripudeep Jenna Luo, MD  ketoconazole (NIZORAL) 2 % cream Apply 1 application topically at bedtime. 4 week course ordered 03/16/16 - apply to both feet between and under toes   Yes Historical Provider, MD  Multiple Vitamin (MULTIVITAMIN WITH MINERALS) TABS tablet Take 1 tablet by mouth at bedtime. Daily-Vite   Yes Historical Provider, MD  nitroGLYCERIN (NITROSTAT) 0.4 MG SL tablet Place 1 tablet (0.4 mg total) under the tongue every 5 (five) minutes x 3 doses as needed for chest pain. 12/02/14  Yes Janetta Hora, PA-C  omeprazole (PRILOSEC) 20 MG capsule Take 20 mg by mouth daily.   Yes Historical Provider, MD  polyethylene glycol (MIRALAX / GLYCOLAX) packet Take 17 g by mouth daily. Mix in 8 oz liquid and drink    Yes Historical Provider, MD  potassium chloride (K-DUR) 10 MEQ tablet Take 30 mEq by mouth daily.   Yes Historical Provider, MD  pravastatin (PRAVACHOL) 40 MG tablet Take 1 tablet (40 mg total) by mouth daily. Patient taking differently: Take 40 mg by mouth at bedtime.  06/17/14  Yes Earl Lagos, MD  PRESCRIPTION MEDICATION Inhale into the lungs at bedtime. CPAP   Yes Historical Provider, MD  Protein POWD Take 1 scoop by mouth 2 (two) times daily.   Yes Historical Provider, MD  Skin Protectants, Misc. (BAZA PROTECT EX) Apply 1 application topically See admin instructions. Apply to buttocks/sacrum topically 3 times daily (8am, 2pm, 8pm) and as needed for skin irritation   Yes Historical Provider, MD  tamsulosin (FLOMAX) 0.4 MG CAPS capsule Take 1 capsule (0.4 mg total) by mouth daily. 12/02/14  Yes Janetta Hora, PA-C  terazosin (HYTRIN) 1 MG capsule Take 1 mg by mouth daily.   Yes Historical Provider, MD  traMADol (ULTRAM) 50 MG tablet Take 50 mg by mouth 2 (two) times daily.    Yes Historical Provider, MD  Travoprost, BAK Free, (TRAVATAN) 0.004 % SOLN ophthalmic solution Place 1 drop into the right eye at bedtime.   Yes Historical Provider, MD  vitamin B-12 (CYANOCOBALAMIN) 1000 MCG tablet Take 1,000 mcg by mouth daily.   Yes Historical Provider, MD  white petrolatum (VASELINE) GEL Apply 1 application topically See admin instructions. Apply twice daily to left temple and cover with bandaid until healed   Yes Historical Provider, MD   BP 181/90 mmHg  Pulse 75  Temp(Src) 98.3 F (36.8 C) (Oral)  Resp 15  Ht  (1.778 m)  Wt 190 lb (86.183 kg)  BMI 27.26 kg/m2  SpO2 97%   Physical Exam  Constitutional: He is oriented to person, place, and time. He appears well-developed and well-nourished.  HENT:  Head: Normocephalic and atraumatic.  Right Ear: External ear normal.  Left Ear: External ear normal.  Tongue and lips are dry  Eyes: Conjunctivae and EOM are normal. Pupils are  equal, round, and reactive to light.  Neck: Normal range of motion and phonation normal. Neck supple.  Cardiovascular: Normal rate, regular rhythm and normal heart sounds.   Pulmonary/Chest: Effort normal and breath sounds normal. He exhibits no bony tenderness.  Abdominal: Soft. There is no  tenderness.  Musculoskeletal: Normal range of motion.  Neurological: He is alert and oriented to person, place, and time. No cranial nerve deficit or sensory deficit. He exhibits normal muscle tone. Coordination normal.  Dysarthric. No aphasia.  No nystagmus.   Skin: Skin is warm, dry and intact.  Psychiatric: He has a normal mood and affect. His behavior is normal. Judgment and thought content normal.  Nursing note and vitals reviewed.   ED Course  Procedures (including critical care time)  DIAGNOSTIC STUDIES: Oxygen Saturation is 95% on RA, adequate by my interpretation.    Medications  sodium chloride 0.9 % bolus 500 mL (500 mLs Intravenous New Bag/Given 03/19/16 1422)    Patient Vitals for the past 24 hrs:  BP Temp Temp src Pulse Resp SpO2 Height Weight  03/19/16 1630 181/90 mmHg - - 75 15 97 % - -  03/19/16 1408 128/71 mmHg 98.3 F (36.8 C) Oral 70 16 95 % - -  03/19/16 1357 118/67 mmHg - - 69 16 95 % 5\' 10"  (1.778 m) 190 lb (86.183 kg)  03/19/16 1330 - - - - - 94 % - -     COORDINATION OF CARE: 2:09 PM-Discussed treatment plan which includes IV fluids with pt at bedside and pt agreed to plan.   Labs Review Labs Reviewed  CBC WITH DIFFERENTIAL/PLATELET - Abnormal; Notable for the following:    Hemoglobin 12.7 (*)    All other components within normal limits  I-STAT CHEM 8, ED - Abnormal; Notable for the following:    BUN 30 (*)    Creatinine, Ser 1.80 (*)    All other components within normal limits  URINE CULTURE  URINALYSIS, ROUTINE W REFLEX MICROSCOPIC (NOT AT [facility])    Imaging Review No results found. I have personally reviewed and evaluated these images and lab results  as part of my medical decision-making.   EKG Interpretation None      MDM   Final diagnoses:  Nausea and vomiting, vomiting of unspecified type    Nonspecific, vomiting. Improved with treatment here in the ED. Likely mild dehydration with elevated creatinine from baseline. UTI reported, with completion of treatment June 1. SIRS negative.  Nursing Notes Reviewed/ Care Coordinated, and agree without changes. Applicable Imaging Reviewed.  Interpretation of Laboratory Data incorporated into ED treatment   Plan disposition after return of urinalysis, likely home with symptomatic treatment  I personally performed the services described in this documentation, which was scribed in my presence. The recorded information has been reviewed and is accurate.      Mancel Bale, MD 03/20/16 [id number]

## 2016-03-19 NOTE — ED Notes (Signed)
Myself, Chelsea, RN and DunlevyBrooke, RN cleaned patient and placed a clean diaper on patient; also assisted Brooke, Charity fundraiserN with an in and out cath to obtain urine sample with success; patient readjusted on stretcher and resting at this time

## 2016-03-19 NOTE — ED Notes (Signed)
Patient physically moved from hallway to room A8; patient undressed and into a gown, placed on monitor, continuous pulse oximetry and blood pressure cuff

## 2016-03-19 NOTE — ED Notes (Signed)
Pt tolerated PO fluids w/o difficulty. 

## 2016-03-19 NOTE — ED Notes (Signed)
Patient made aware of need of urine specimen; urinal handed to patient to attempt at this time

## 2016-03-19 NOTE — Discharge Instructions (Signed)

## 2016-03-19 NOTE — ED Notes (Signed)
Patient unable to urinate at this time; patient given a cup of ice water to drink

## 2016-03-19 NOTE — ED Notes (Signed)
Pt arrives EMS with c/o weakness and less able to pivot b y PT at nursing home. Finished ABX for UTI on June 1.vomited x 1.

## 2016-03-20 LAB — URINE CULTURE
Culture: 1000 — AB
Special Requests: NORMAL

## 2016-03-25 ENCOUNTER — Emergency Department (HOSPITAL_COMMUNITY)
Admission: EM | Admit: 2016-03-25 | Discharge: 2016-03-25 | Disposition: A | Discharge status: Home / Self Care | Payer: Medicare Other | Attending: Emergency Medicine | Admitting: Emergency Medicine

## 2016-03-25 ENCOUNTER — Other Ambulatory Visit: Payer: Self-pay

## 2016-03-25 ENCOUNTER — Emergency Department (HOSPITAL_COMMUNITY): Payer: Medicare Other

## 2016-03-25 ENCOUNTER — Encounter (HOSPITAL_COMMUNITY): Payer: Self-pay

## 2016-03-25 DIAGNOSIS — I2510 Atherosclerotic heart disease of native coronary artery without angina pectoris: Secondary | ICD-10-CM | POA: Diagnosis not present

## 2016-03-25 DIAGNOSIS — I5042 Chronic combined systolic (congestive) and diastolic (congestive) heart failure: Secondary | ICD-10-CM | POA: Insufficient documentation

## 2016-03-25 DIAGNOSIS — Z87891 Personal history of nicotine dependence: Secondary | ICD-10-CM | POA: Diagnosis not present

## 2016-03-25 DIAGNOSIS — I110 Hypertensive heart disease with heart failure: Secondary | ICD-10-CM | POA: Insufficient documentation

## 2016-03-25 DIAGNOSIS — Y939 Activity, unspecified: Secondary | ICD-10-CM | POA: Insufficient documentation

## 2016-03-25 DIAGNOSIS — Z955 Presence of coronary angioplasty implant and graft: Secondary | ICD-10-CM | POA: Insufficient documentation

## 2016-03-25 DIAGNOSIS — Y929 Unspecified place or not applicable: Secondary | ICD-10-CM | POA: Insufficient documentation

## 2016-03-25 DIAGNOSIS — Z79899 Other long term (current) drug therapy: Secondary | ICD-10-CM | POA: Insufficient documentation

## 2016-03-25 DIAGNOSIS — Y999 Unspecified external cause status: Secondary | ICD-10-CM | POA: Diagnosis not present

## 2016-03-25 DIAGNOSIS — W19XXXA Unspecified fall, initial encounter: Secondary | ICD-10-CM

## 2016-03-25 DIAGNOSIS — S0990XA Unspecified injury of head, initial encounter: Secondary | ICD-10-CM | POA: Insufficient documentation

## 2016-03-25 DIAGNOSIS — Z7901 Long term (current) use of anticoagulants: Secondary | ICD-10-CM | POA: Diagnosis not present

## 2016-03-25 DIAGNOSIS — W050XXA Fall from non-moving wheelchair, initial encounter: Secondary | ICD-10-CM | POA: Diagnosis not present

## 2016-03-25 DIAGNOSIS — E785 Hyperlipidemia, unspecified: Secondary | ICD-10-CM | POA: Diagnosis not present

## 2016-03-25 NOTE — ED Notes (Signed)
PTAR at bedside 

## 2016-03-25 NOTE — ED Notes (Addendum)
[facility]  Onset [facility] PTA pt had unwitnessed fall.  Larey SeatFell getting from wheelchair to chair. Pt slid down chair hitting back of head on carpeted floor.  NO LOC.  Pt is at baseline.  A&O person,situation not time, place.

## 2016-03-25 NOTE — ED Provider Notes (Addendum)
CSN: [id number]     Arrival date & time 03/25/16  1355 History   First MD Initiated Contact with Patient 03/25/16 1355     Chief Complaint  Patient presents with  . Fall  . Head Injury     (Consider location/radiation/quality/duration/timing/severity/associated sxs/prior Treatment) HPI  Unwitnessed slide from wheelchair while trying to stand from wheelchair Hit bottom and hit head No LOC Fall heard by staff and EMS called Pt reports mechanical fall, reports he did have mild headache and pain on his bottom however now denies any pain Denies CP/SOB/lightheadedness/localized weakness No fever, no recent illness  Per facility, pt is normally oriented to self  Past Medical History  Diagnosis Date  . Glaucoma     Right eye  . Diverticulosis     multiple hospital admissions for GI bleed, has an episode roughly every 6 months,, last  colonscopy in 2006 showed diverticulosis, was evaluated by Dr. Madilyn Fireman in the hospital in 2011 for lower GI bleed andd it  was suggested that the patient was not actively bleeding at that time and given his comorbid conditions colonoscopy was deferred. He is also chronically constipated  . Macular degeneration of right eye   . Allergic rhinitis   . CAD (coronary artery disease)     a. h/o remote inferior MI, h/o PCI to RCA in 1991. b. syncope 2013: severe AS and high grade prox LAD and other diffuse disease. High risk for CABG - s/p BMS to prox LAD and balloon aortic valvuloplasty. c. cath 07/16/14 - diffuse CAD, managed medically.  . Hypertension   . Hyperlipidemia   . Chronic combined systolic and diastolic CHF (congestive heart failure) (HCC)     a. Most recent echo 08/2014: EF 30-35%.  . Supraventricular tachycardia (HCC)     2 syndromes-nonsustained atrial tachycardia//adenosine responsive diuretic positive reentry probably AV node reentry  . Aortic stenosis     a. s/p AV balloon valvuloplasty by Dr. Excell Seltzer 11/2011. b. s/p TAVR 08/2014.  . Ischemic  cardiomyopathy     a. 2D ECHO 11/2014: EF of 20-25%  . H/O: GI bleed   . Hypothyroidism   . GERD (gastroesophageal reflux disease)   . Falls frequently     3 times in the past week/notes 09/11/2013  . PAF (paroxysmal atrial fibrillation) (HCC)     a. on amiodarone and eliquis   . Peripheral vascular disease (HCC)     a. h/o ulcer on the left great toe s/p angioplasty of the left anterior tibial artery.  . OSA on CPAP   . Limited mobility   . S/P TAVR (transcatheter aortic valve replacement)     a. 08/2014: 29 mm Edwards Sapien XT transcatheter heart valve placed via open left transfemoral approach  . PAT (paroxysmal atrial tachycardia) (HCC)   . Atrial flutter [facility])    Past Surgical History  Procedure Laterality Date  . Glaucoma surgery      Implantation of Baerveldt glaucoma device lant with scleral reinforcement using tutoplast tissue graft right eye. [Other]  . Coronary angioplasty with stent placement  Jan. 23, 2013  . Corneal transplant Right   . Angioplasty Left 03/30/2013    tibial  . Tonsillectomy  1946  . Cataract extraction w/ intraocular lens  implant, bilateral Bilateral   . Cardiac valve replacement  11/30/2011    aortic valve.?  . Transcatheter aortic valve replacement, transfemoral N/A 08/10/2014    Procedure: TRANSCATHETER AORTIC VALVE REPLACEMENT, TRANSFEMORAL;  Surgeon: Tonny Bollman, MD;  Location: [facility] OR;  Service: Open Heart Surgery;  Laterality: N/A;  . Tee without cardioversion N/A 08/10/2014    Procedure: TRANSESOPHAGEAL ECHOCARDIOGRAM (TEE);  Surgeon: Tonny Bollman, MD;  Location: [facility] OR;  Service: Open Heart Surgery;  Laterality: N/A;  . Coronary angiogram  11/02/2011    Procedure: CORONARY ANGIOGRAM;  Surgeon: Rollene Rotunda, MD;  Location: [facility] CATH LAB;  Service: Cardiovascular;;  . Left heart catheterization with coronary angiogram N/A 11/12/2011    Procedure: LEFT HEART CATHETERIZATION WITH CORONARY ANGIOGRAM;  Surgeon: Tonny Bollman, MD;  Location: [facility] CATH  LAB;  Service: Cardiovascular;  Laterality: N/A;  . Percutaneous coronary stent intervention (pci-s) N/A 11/14/2011    Procedure: PERCUTANEOUS CORONARY STENT INTERVENTION (PCI-S);  Surgeon: Tonny Bollman, MD;  Location: [facility] CATH LAB;  Service: Cardiovascular;  Laterality: N/A;  . Abdominal aortagram N/A 03/30/2013    Procedure: ABDOMINAL Ronny Flurry;  Surgeon: Chuck Hint, MD;  Location: [facility] CATH LAB;  Service: Cardiovascular;  Laterality: N/A;  . Left and right heart catheterization with coronary angiogram N/A 07/16/2014    Procedure: LEFT AND RIGHT HEART CATHETERIZATION WITH CORONARY ANGIOGRAM;  Surgeon: Micheline Chapman, MD;  Location: [facility] CATH LAB;  Service: Cardiovascular;  Laterality: N/A;   Family History  Problem Relation Age of Onset  . Cancer Mother     Ovarian Cancer  . Cancer Father     Lung  . Heart disease Sister     Heart disease before age 78  . Stroke Sister   . Heart attack Neg Hx    Social History  Substance Use Topics  . Smoking status: Former Smoker -- 3.00 packs/day for 21 years    Types: Cigarettes    Quit date: 10/08/1958  . Smokeless tobacco: Never Used  . Alcohol Use: No    Review of Systems  Constitutional: Negative for fever.  HENT: Negative for sore throat.   Eyes: Negative for visual disturbance.  Respiratory: Negative for shortness of breath.   Cardiovascular: Negative for chest pain.  Gastrointestinal: Negative for nausea, vomiting, abdominal pain and diarrhea.  Genitourinary: Negative for difficulty urinating.  Musculoskeletal: Negative for back pain and neck pain.  Skin: Positive for wound. Negative for rash.  Neurological: Positive for headaches. Negative for syncope, speech difficulty, weakness and numbness.      Allergies  Corn-containing products and Penicillins  Home Medications   Prior to Admission medications   Medication Sig Start Date End Date Taking? Authorizing Provider  acetaminophen (TYLENOL) 500 MG tablet Take 1,000  mg by mouth every 6 (six) hours as needed (pain).    Historical Provider, MD  amiodarone (PACERONE) 200 MG tablet Take 1 tablet (200 mg total) by mouth 2 (two) times daily. 12/02/14   Janetta Hora, PA-C  apixaban (ELIQUIS) 2.5 MG TABS tablet Take 1 tablet (2.5 mg total) by mouth 2 (two) times daily. 12/02/14   Janetta Hora, PA-C  buPROPion (WELLBUTRIN) 100 MG tablet Take 100 mg by mouth 2 (two) times daily.    Historical Provider, MD  Calcium Carb-Cholecalciferol (CALCIUM 600+D) 600-800 MG-UNIT TABS Take 1 tablet by mouth daily.    Historical Provider, MD  carboxymethylcellulose (REFRESH) 1 % ophthalmic solution Place 4 drops into both eyes 4 (four) times daily.    Historical Provider, MD  cetirizine (ZYRTEC) 10 MG tablet Take 10 mg by mouth daily.    Historical Provider, MD  docusate sodium (COLACE) 100 MG capsule Take 100 mg by mouth 2 (two) times daily as needed (constipation).    Historical Provider, MD  furosemide (LASIX) 20 MG tablet Take 60 mg by mouth daily.    Historical Provider, MD  hydrALAZINE (APRESOLINE) 25 MG tablet Take 1 tablet (25 mg total) by mouth 2 (two) times daily. 01/20/16   Ripudeep Jenna Luo, MD  isosorbide mononitrate (IMDUR) 30 MG 24 hr tablet Take 1 tablet (30 mg total) by mouth daily. 01/20/16   Ripudeep Jenna Luo, MD  ketoconazole (NIZORAL) 2 % cream Apply 1 application topically at bedtime. 4 week course ordered 03/16/16 - apply to both feet between and under toes    Historical Provider, MD  Multiple Vitamin (MULTIVITAMIN WITH MINERALS) TABS tablet Take 1 tablet by mouth at bedtime. Daily-Vite    Historical Provider, MD  nitroGLYCERIN (NITROSTAT) 0.4 MG SL tablet Place 1 tablet (0.4 mg total) under the tongue every 5 (five) minutes x 3 doses as needed for chest pain. 12/02/14   Janetta Hora, PA-C  omeprazole (PRILOSEC) 20 MG capsule Take 20 mg by mouth daily.    Historical Provider, MD  ondansetron (ZOFRAN) 8 MG tablet Take 1 tablet (8 mg total) by mouth every 8  (eight) hours as needed for nausea or vomiting. 03/19/16   Mancel Bale, MD  polyethylene glycol (MIRALAX / Ethelene Hal) packet Take 17 g by mouth daily. Mix in 8 oz liquid and drink    Historical Provider, MD  potassium chloride (K-DUR) 10 MEQ tablet Take 30 mEq by mouth daily.    Historical Provider, MD  pravastatin (PRAVACHOL) 40 MG tablet Take 1 tablet (40 mg total) by mouth daily. Patient taking differently: Take 40 mg by mouth at bedtime.  06/17/14   Earl Lagos, MD  PRESCRIPTION MEDICATION Inhale into the lungs at bedtime. CPAP    Historical Provider, MD  Protein POWD Take 1 scoop by mouth 2 (two) times daily.    Historical Provider, MD  Skin Protectants, Misc. (BAZA PROTECT EX) Apply 1 application topically See admin instructions. Apply to buttocks/sacrum topically 3 times daily (8am, 2pm, 8pm) and as needed for skin irritation    Historical Provider, MD  tamsulosin (FLOMAX) 0.4 MG CAPS capsule Take 1 capsule (0.4 mg total) by mouth daily. 12/02/14   Janetta Hora, PA-C  terazosin (HYTRIN) 1 MG capsule Take 1 mg by mouth daily.    Historical Provider, MD  traMADol (ULTRAM) 50 MG tablet Take 50 mg by mouth 2 (two) times daily.     Historical Provider, MD  Travoprost, BAK Free, (TRAVATAN) 0.004 % SOLN ophthalmic solution Place 1 drop into the right eye at bedtime.    Historical Provider, MD  vitamin B-12 (CYANOCOBALAMIN) 1000 MCG tablet Take 1,000 mcg by mouth daily.    Historical Provider, MD  white petrolatum (VASELINE) GEL Apply 1 application topically See admin instructions. Apply twice daily to left temple and cover with bandaid until healed    Historical Provider, MD   BP 181/95 mmHg  Pulse 73  Temp(Src) 97.8 F (36.6 C) (Oral)  Resp 13  SpO2 97% Physical Exam  Constitutional: He appears well-developed and well-nourished. No distress.  HENT:  Head: Normocephalic and atraumatic.  Eyes: Conjunctivae and EOM are normal.  Neck: Normal range of motion.  Cardiovascular: Normal  rate, regular rhythm, normal heart sounds and intact distal pulses.  Exam reveals no gallop and no friction rub.   No murmur heard. Pulmonary/Chest: Effort normal and breath sounds normal. No respiratory distress. He has no wheezes. He has no rales.  Abdominal: Soft. He exhibits no distension. There is no tenderness. There is  no guarding.  Musculoskeletal: He exhibits no edema.  Neurological: He is alert. GCS eye subscore is 4. GCS verbal subscore is 5. GCS motor subscore is 6.  Oriented to self Initial exam concern for ?LUE drift, however on immediate repeat exam, patient with good strength bilaterally in upper and lower extremities, symmetric smile, normal sensation, symmetric palate elevation Deferred gait due to fall risk  Skin: Skin is warm and dry. He is not diaphoretic.  Nursing note and vitals reviewed.   ED Course  Procedures (including critical care time) Labs Review Labs Reviewed - No data to display  Imaging Review Dg Sacrum/coccyx  03/25/2016  CLINICAL DATA:  Un witnessed fall.  Tailbone region pain. EXAM: SACRUM AND COCCYX - 2+ VIEW COMPARISON:  None. FINDINGS: No fracture.  No bone lesion. SI joints are normally spaced and aligned. Bones are demineralized. IMPRESSION: No fracture. Electronically Signed   By: Amie Portlandavid  Ormond M.D.   On: 03/25/2016 15:16   Ct Head Wo Contrast  03/25/2016  CLINICAL DATA:  Larey SeatFell, on blood thinners. Hit head. No loss of consciousness. Dementia. EXAM: CT HEAD WITHOUT CONTRAST TECHNIQUE: Contiguous axial images were obtained from the base of the skull through the vertex without intravenous contrast. COMPARISON:  03/14/2016 most recent. FINDINGS: No evidence for acute infarction, hemorrhage, mass lesion, or extra-axial fluid. Global atrophy. Hydrocephalus ex vacuo. Chronic microvascular ischemic change. Vascular calcification. Hyperostosis. Small remote LEFT caudate lacune. No sinus or mastoid disease. BILATERAL cataract extraction. RIGHT eye globe banding  procedure. Similar appearance to priors. IMPRESSION: Atrophy and small vessel disease.  No acute intracranial findings. No skull fracture or intracranial hemorrhage. Electronically Signed   By: Elsie StainJohn T Curnes M.D.   On: 03/25/2016 14:41   I have personally reviewed and evaluated these images and lab results as part of my medical decision-making.   EKG Interpretation   Date/Time:  Sunday March 25 2016 13:50:22 EDT Ventricular Rate:  78 PR Interval:    QRS Duration: 136 QT Interval:  409 QTC Calculation: 466 R Axis:   -67 Text Interpretation:  No significant change since last tracing Sinus  rhythm Prolonged PR interval Nonspecific IVCD with LAD Inferior infarct,  old Lateral leads are also involved Confirmed by [facility] HospitalCHLOSSMAN MD, Adelma Bowdoin  ([id number]) on 03/25/2016 7:01:49 PM      MDM   Final diagnoses:  Fall, initial encounter   82 year old male with history of diverticulosis, coronary artery disease, hypertension, GI bleed, chronic combined systolic and diastolic heart failure, hyperlipidemia, SVT, aortic stenosis status post have her, ischemic cardiomyopathy paroxysmal atrial fibrillation, atrial flutter, paroxysmal atrial tachycardia on eliquis presents with concern for fall out of wheelchair. Called and discussed with [facility] who confirms history that patient had a fall out of his wheelchair which was unwitnessed, however patient reported hitting his head on the carpet. Patient and facility deny any other acute concerns. Patient denies any other areas of pain at this time. No neck pain, no tenderness, normal neurologic exam. CT head was done which showed no evidence of acute bleed or fracture. X-ray of his sacrum was within normal limits. Patient with good range of motion of both hips, low suspicion for other injuries. He appears to have normal bilateral strength on exam.  Patient stable for continued outpatient follow up. Patient discharged in stable condition with understanding of reasons to  return.     Alvira MondayErin Maricia Scotti, MD 03/25/16 1902

## 2016-03-25 NOTE — ED Notes (Signed)
Pt is non-ambulatory/wheelchair bound

## 2016-04-19 ENCOUNTER — Telehealth: Payer: Self-pay | Admitting: Cardiovascular Disease

## 2016-04-19 NOTE — Telephone Encounter (Signed)
New Message   Pt son call requesting to speak with RN. Pt son wants to inform Provider pt has been place in hospice. He states the RN is in the process of removing his meds. Please call back to discuss

## 2016-04-19 NOTE — Telephone Encounter (Signed)
Left Eugene FearingJames [name] pt's legal guardian a message to call back.

## 2016-04-26 NOTE — Telephone Encounter (Signed)
Follow up     Calling to talk to a triage nurse regarding Mr Eugene [name]

## 2016-04-26 NOTE — Telephone Encounter (Signed)
I spoke with Mr Ane PaymentMcDaniels and he wanted to make Dr [name] aware that Mr Maisie Fushomas is now in Hospice and they have stopped his vitamins, blood thinners and other medications. [facility])

## 2016-04-26 NOTE — Telephone Encounter (Signed)
I left a message for Eugene [name]'s to contact the office if he still had questions in regards to Eugene [name] medications.

## 2016-05-10 ENCOUNTER — Encounter: Payer: Self-pay | Admitting: Family

## 2016-05-16 ENCOUNTER — Other Ambulatory Visit: Payer: Self-pay | Admitting: *Deleted

## 2016-05-16 DIAGNOSIS — I739 Peripheral vascular disease, unspecified: Secondary | ICD-10-CM

## 2016-05-18 ENCOUNTER — Ambulatory Visit: Payer: Medicare Other | Admitting: Family

## 2016-05-18 ENCOUNTER — Ambulatory Visit (HOSPITAL_COMMUNITY): Payer: Medicare Other

## 2016-06-28 ENCOUNTER — Telehealth: Payer: Self-pay | Admitting: Cardiovascular Disease

## 2016-06-28 NOTE — Telephone Encounter (Signed)
New message   Eugene [name] verbalized that he is calling to speak to the rn about pt and appointments. Pt had appt on 07-02-16 and it has been canceled 06-28-16

## 2016-06-28 NOTE — Telephone Encounter (Signed)
I spoke with Mr Eugene [name] and he said the pt is now in Hospice care and that the pt's appointment on 9/25 should be cancelled.  I made him aware that the pt's appointment has been cancelled.

## 2016-07-02 ENCOUNTER — Ambulatory Visit: Payer: Medicare Other | Admitting: Cardiovascular Disease

## 2016-08-05 IMAGING — CR DG CHEST 2V
2 series · 2 of 2 positions shown · non-contrast
Comparison: 04/24/2014

CLINICAL DATA: Preop for aortic stenosis

EXAM:
CHEST  2 VIEW

[w chest lat]
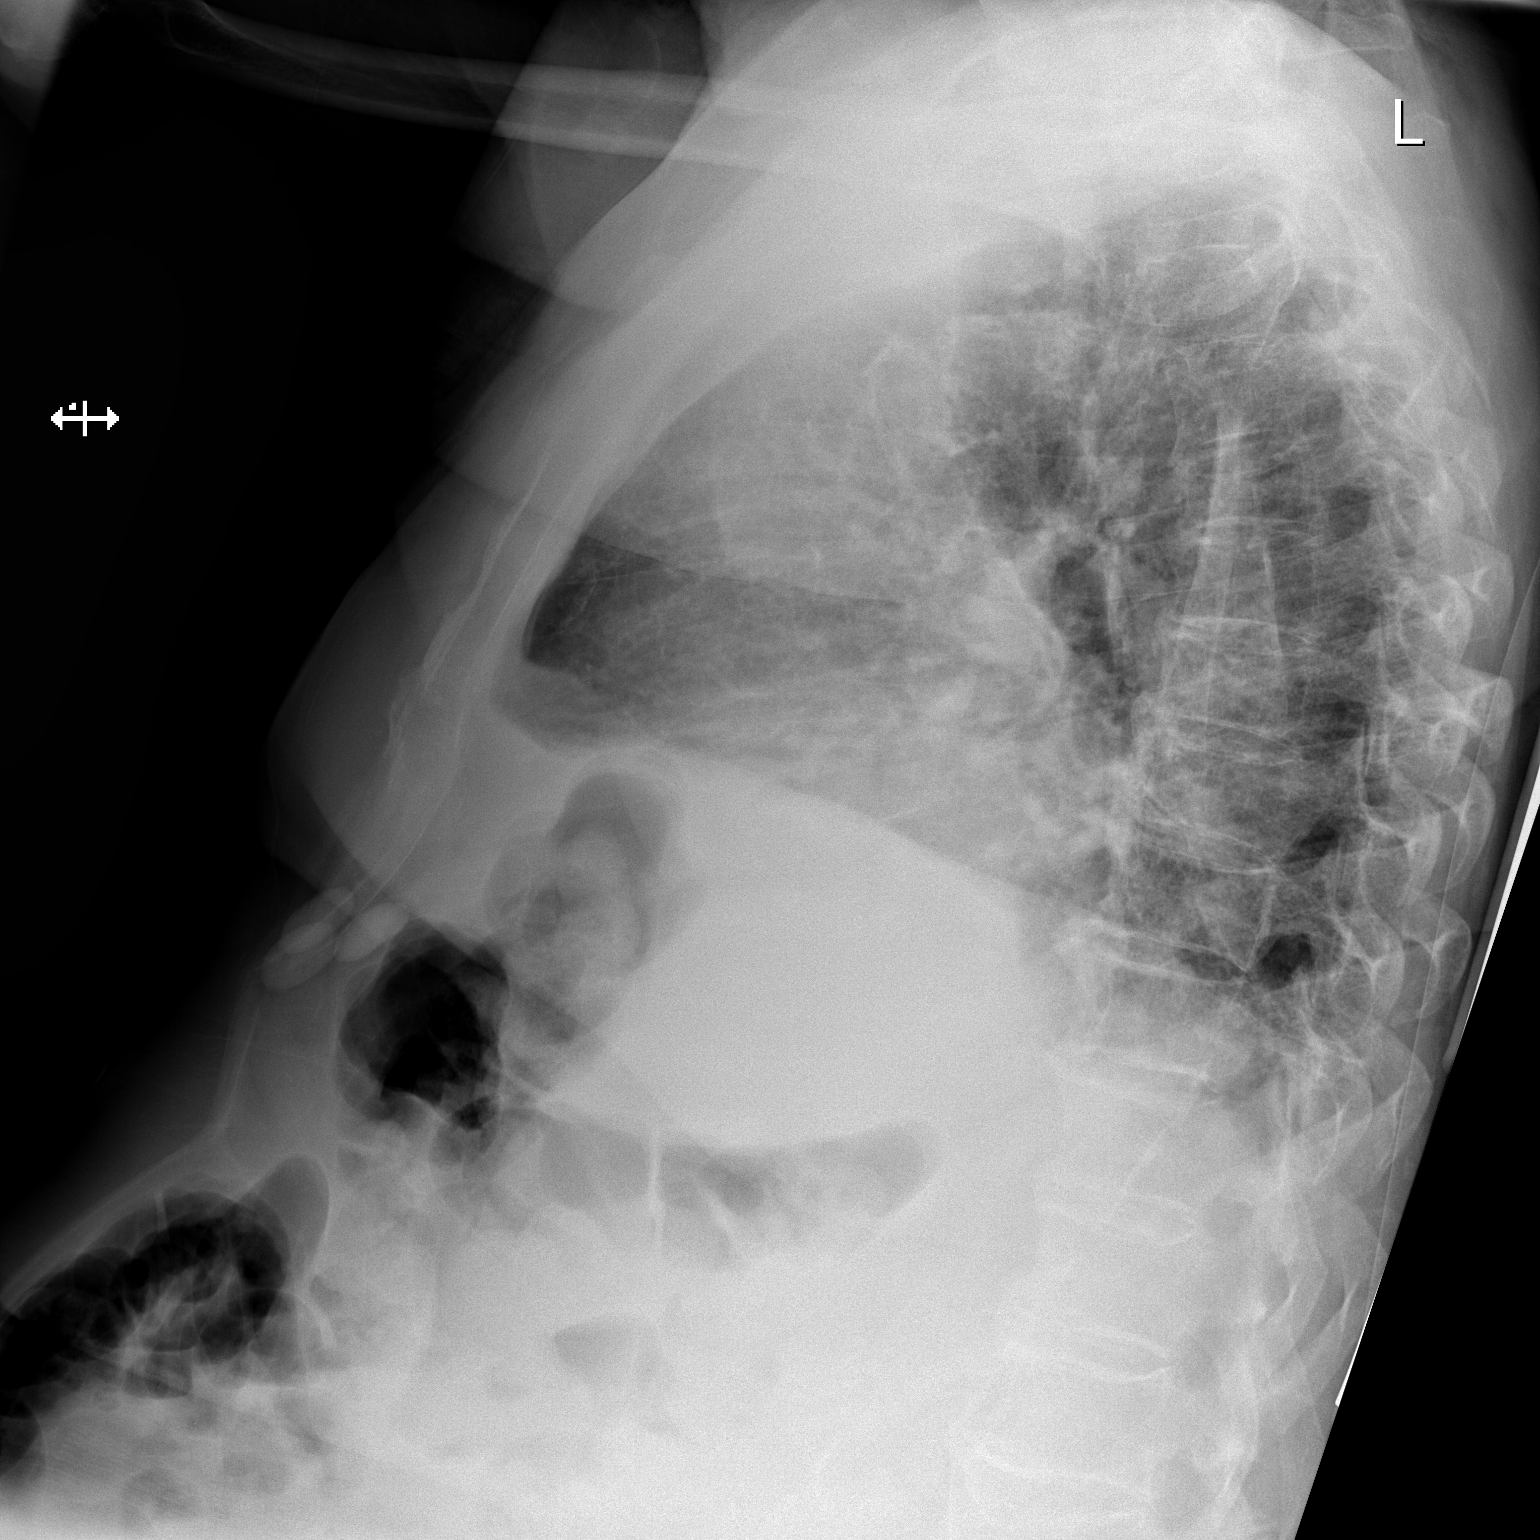

[x chest ap]
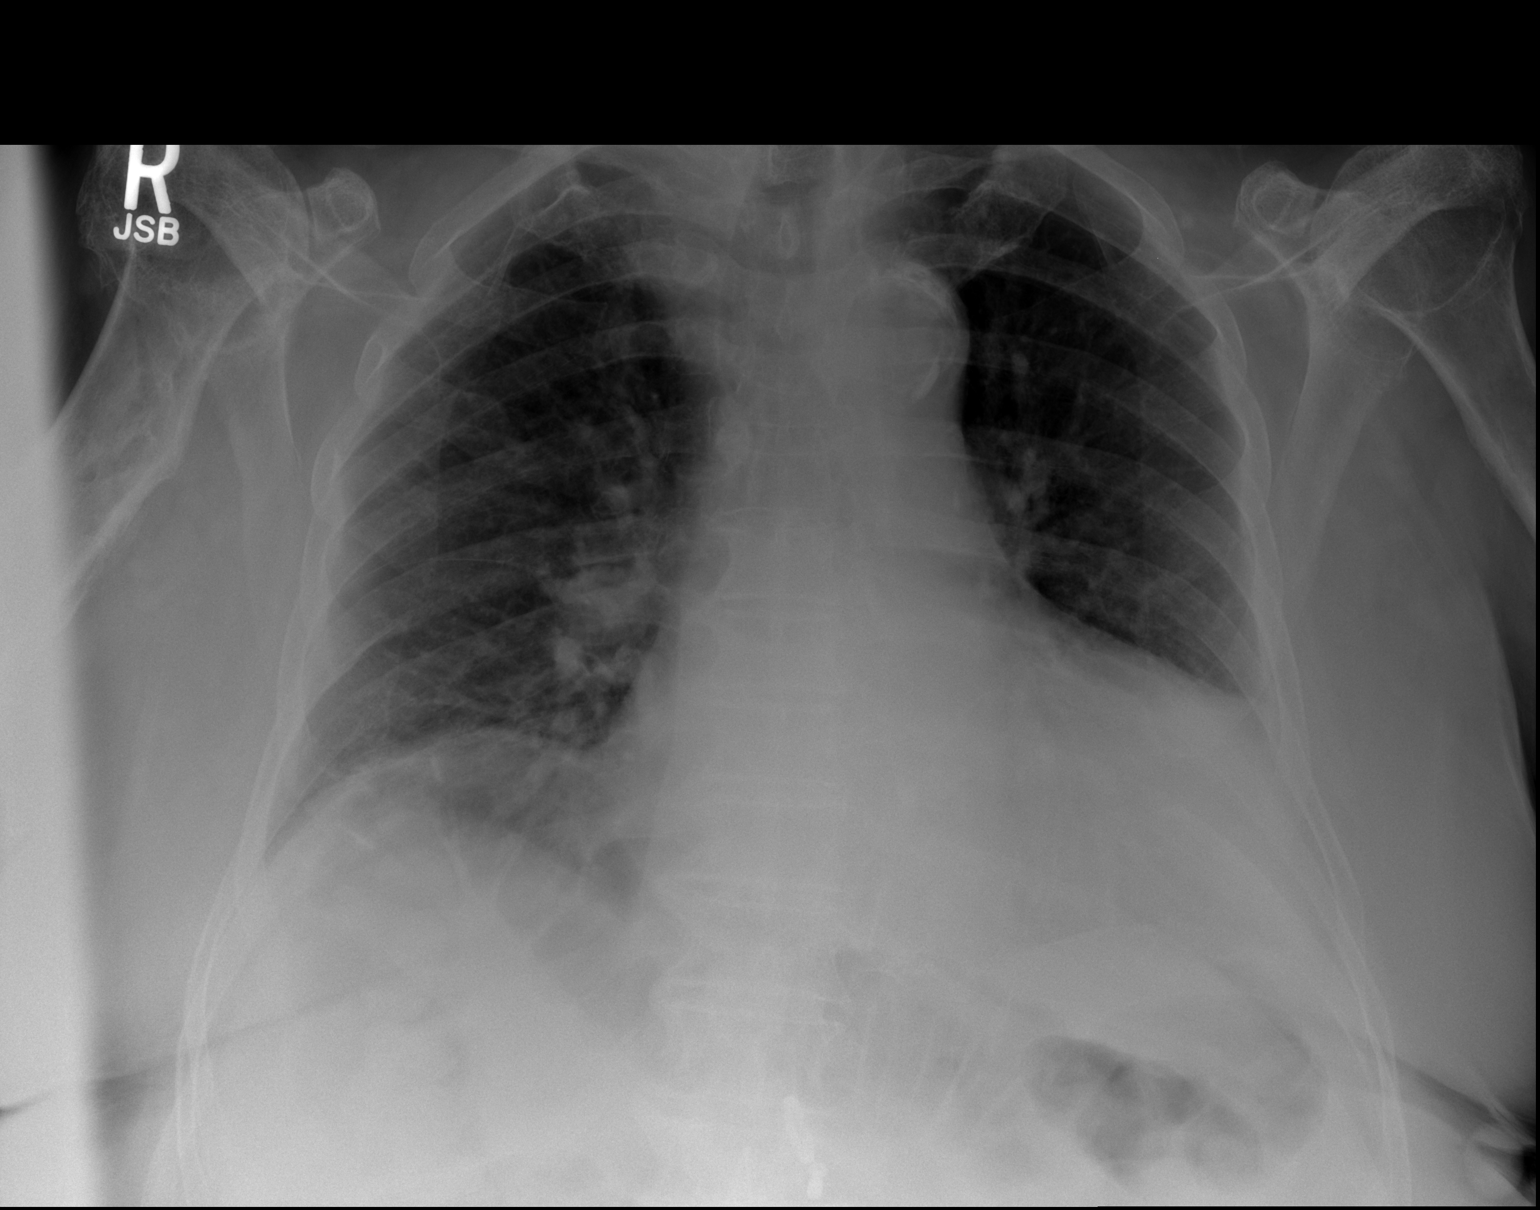

[2 of 2 positions shown; findings below may reference images not displayed]

FINDINGS: Cardiomediastinal silhouette is stable. There is chronic elevation
of the right hemidiaphragm. No acute infiltrate or pulmonary edema.
Degenerative changes right shoulder. Stable chronic fracture
deformity of proximal right humerus. Atherosclerotic calcifications
of thoracic aorta.
IMPRESSION: No active disease. Atherosclerotic calcifications of thoracic aorta
again noted.

## 2016-08-09 IMAGING — CR DG CHEST 1V PORT
1 series · 1 of 1 positions shown · non-contrast
Comparison: PA and lateral chest of August 06, 2014

CLINICAL DATA: Status post aortic valve replacement using
transcatheter techniques

EXAM:
PORTABLE CHEST - 1 VIEW

[portable]
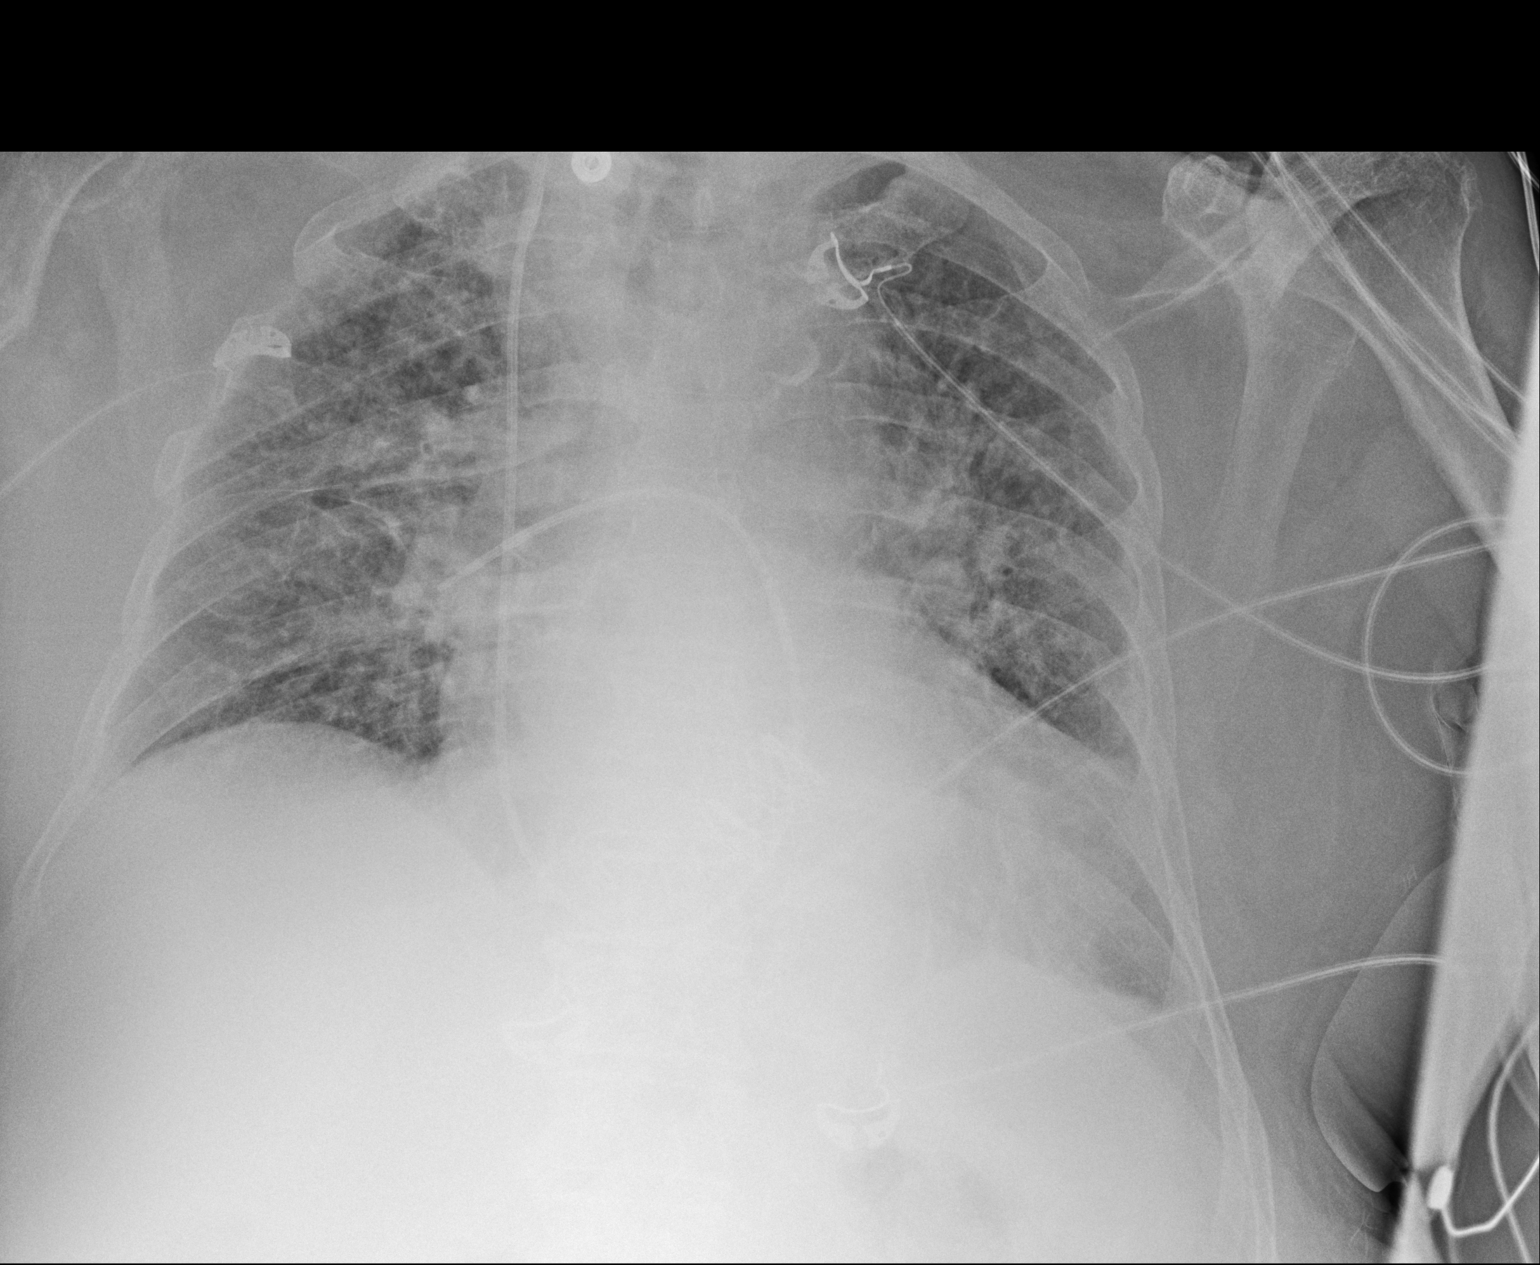

[1 of 1 positions shown; findings below may reference images not displayed]

FINDINGS: The lungs are borderline hypoinflated. The interstitial markings are
increased diffusely. The pulmonary vascularity is engorged and
indistinct. The cardiopericardial silhouette is enlarged. The
Swan-Ganz catheter tip projects in the mid to distal portion of the
right main pulmonary artery.
IMPRESSION: There is bilateral pulmonary interstitial edema likely collecting
secondary to CHF. There is no evidence of pneumonia nor significant
atelectasis.

## 2016-10-08 DEATH — deceased
